# Patient Record
Sex: Male | Born: 1940 | Race: Black or African American | Hispanic: No | Marital: Married | Smoking: Never smoker
Health system: Southern US, Community
[De-identification: ages and names within clinical notes are randomized; demographics above are authoritative.]

## PROBLEM LIST (undated history)

## (undated) DIAGNOSIS — R040 Epistaxis: Secondary | ICD-10-CM

## (undated) DIAGNOSIS — R7303 Prediabetes: Secondary | ICD-10-CM

## (undated) DIAGNOSIS — I10 Essential (primary) hypertension: Secondary | ICD-10-CM

## (undated) HISTORY — PX: KNEE ARTHROSCOPY: SUR90

## (undated) HISTORY — PX: COLONOSCOPY: SHX174

## (undated) HISTORY — PX: POLYPECTOMY: SHX149

## (undated) HISTORY — DX: Essential (primary) hypertension: I10

---

## 2000-05-17 ENCOUNTER — Emergency Department (HOSPITAL_COMMUNITY): Admission: EM | Admit: 2000-05-17 | Discharge: 2000-05-17 | Payer: Self-pay | Admitting: Emergency Medicine

## 2005-04-30 ENCOUNTER — Ambulatory Visit: Payer: Self-pay | Admitting: Gastroenterology

## 2005-05-11 ENCOUNTER — Ambulatory Visit: Payer: Self-pay | Admitting: Gastroenterology

## 2010-07-02 ENCOUNTER — Encounter: Payer: Self-pay | Admitting: Gastroenterology

## 2010-07-02 ENCOUNTER — Ambulatory Visit (AMBULATORY_SURGERY_CENTER): Payer: Medicare HMO | Admitting: *Deleted

## 2010-07-02 VITALS — Ht 69.0 in | Wt 246.0 lb

## 2010-07-02 DIAGNOSIS — Z86010 Personal history of colonic polyps: Secondary | ICD-10-CM

## 2010-07-02 MED ORDER — PEG-KCL-NACL-NASULF-NA ASC-C 100 G PO SOLR: ORAL | Status: DC

## 2010-07-16 ENCOUNTER — Encounter: Payer: Self-pay | Admitting: Gastroenterology

## 2010-07-16 ENCOUNTER — Ambulatory Visit (AMBULATORY_SURGERY_CENTER): Payer: Medicare HMO | Admitting: Gastroenterology

## 2010-07-16 DIAGNOSIS — K5730 Diverticulosis of large intestine without perforation or abscess without bleeding: Secondary | ICD-10-CM

## 2010-07-16 DIAGNOSIS — D126 Benign neoplasm of colon, unspecified: Secondary | ICD-10-CM

## 2010-07-16 DIAGNOSIS — Z86010 Personal history of colonic polyps: Secondary | ICD-10-CM

## 2010-07-16 DIAGNOSIS — Z1211 Encounter for screening for malignant neoplasm of colon: Secondary | ICD-10-CM

## 2010-07-16 MED ORDER — SODIUM CHLORIDE 0.9 % IV SOLN: 500.0000 mL | INTRAVENOUS | Status: DC

## 2010-07-16 NOTE — Patient Instructions (Signed)
Polyps, Colon  A polyp is extra tissue that grows inside your body. Colon polyps grow in the large intestine. The large intestine, also called the colon, is part of your digestive system. It is a long, hollow tube at the end of your digestive tract where your body makes and stores stool. Most polyps are not dangerous. They are benign. This means they are not cancerous. But over time, some types of polyps can turn into cancer. Polyps that are smaller than a pea are usually not harmful. But larger polyps could someday become or may already be cancerous. To be safe, doctors remove all polyps and test them.  WHO GETS POLYPS? Anyone can get polyps, but certain people are more likely than others. You may have a greater chance of getting polyps if:  You are over 50.   You have had polyps before.   Someone in your family has had polyps.   Someone in your family has had cancer of the large intestine.   Find out if someone in your family has had polyps. You may also be more likely to get polyps if you:   Eat a lot of fatty foods   Smoke   Drink alcohol   Do not exercise  Eat too much  SYMPTOMS Most small polyps do not cause symptoms. People often do not know they have one until their caregiver finds it during a regular checkup or while testing them for something else. Some people do have symptoms like these:  Bleeding from the anus. You might notice blood on your underwear or on toilet paper after you have had a bowel movement.   Constipation or diarrhea that lasts more than a week.   Blood in the stool. Blood can make stool look black or it can show up as red streaks in the stool.  If you have any of these symptoms, see your caregiver. HOW DOES THE DOCTOR TEST FOR POLYPS? The doctor can use four tests to check for polyps:  Digital rectal exam. The caregiver wears gloves and checks your rectum (the last part of the large intestine) to see if it feels normal. This test would find polyps only  in the rectum. Your caregiver may need to do one of the other tests listed below to find polyps higher up in the intestine.   Barium enema. The caregiver puts a liquid called barium into your rectum before taking x-rays of your large intestine. Barium makes your intestine look white in the pictures. Polyps are dark, so they are easy to see.   Sigmoidoscopy. With this test, the caregiver can see inside your large intestine. A thin flexible tube is placed into your rectum. The device is called a sigmoidoscope, which has a light and a tiny video camera in it. The caregiver uses the sigmoidoscope to look at the last third of your large intestine.   Colonoscopy. This test is like sigmoidoscopy, but the caregiver looks at all of the large intestine. It usually requires sedation. This is the most common method for finding and removing polyps.  TREATMENT  The caregiver will remove the polyp during sigmoidoscopy or colonoscopy. The polyp is then tested for cancer.   If you have had polyps, your caregiver may want you to get tested regularly in the future.  PREVENTION There is not one sure way to prevent polyps. You might be able to lower your risk of getting them if you:  Eat more fruits and vegetables and less fatty food.     Do not smoke.   Avoid alcohol.   Exercise every day.   Lose weight if you are overweight.   Eating more calcium and folate can also lower your risk of getting polyps. Some foods that are rich in calcium are milk, cheese, and broccoli. Some foods that are rich in folate are chickpeas, kidney beans, and spinach.   Aspirin might help prevent polyps. Studies are under way.  Document Released: 11/12/2003 Document Re-Released: 08/05/2009 [employer] Patient Information 2011 [employer]  .Diverticulosis Diverticulosis is a common condition that develops when small pouches (diverticula) form in the wall of the colon. The risk of diverticulosis increases with age. It happens more  often in people who eat a low-fiber diet. Most individuals with diverticulosis have no symptoms. Those individuals with symptoms usually experience belly (abdominal) pain, constipation, or loose stools (diarrhea). HOME CARE INSTRUCTIONS  Increase the amount of fiber in your diet as directed by your caregiver or dietician. This may reduce symptoms of diverticulosis.   Your caregiver may recommend taking a dietary fiber supplement.   Drink at least 6 to 8 glasses of water each day to prevent constipation.   Try not to strain when you have a bowel movement.   Your caregiver may recommend avoiding nuts and seeds to prevent complications, although this is still an uncertain benefit.   Only take over-the-counter or prescription medicines for pain, discomfort, or fever as directed by your caregiver.  FOODS HAVING HIGH FIBER CONTENT INCLUDE:  Fruits. Apple, peach, pear, tangerine, raisins, prunes.   Vegetables. Brussels sprouts, asparagus, broccoli, cabbage, carrot, cauliflower, romaine lettuce, spinach, summer squash, tomato, winter squash, zucchini.   Starchy Vegetables. Baked beans, kidney beans, lima beans, split peas, lentils, potatoes (with skin).   Grains. Whole wheat bread, brown rice, bran flake cereal, plain oatmeal, white rice, shredded wheat, bran muffins.  SEEK IMMEDIATE MEDICAL CARE IF:  You develop increasing pain or severe bloating.   You have an oral temperature above 100, not controlled by medicine.   You develop vomiting or bowel movements that are bloody or black.  Document Released: 11/13/2003 Document Re-Released: 08/05/2009 [employer] Patient Information 2011 [employer]. 

## 2010-07-17 ENCOUNTER — Telehealth: Payer: Self-pay

## 2010-07-17 NOTE — Telephone Encounter (Signed)

## 2013-12-11 ENCOUNTER — Encounter: Payer: Self-pay | Admitting: Gastroenterology

## 2014-01-21 ENCOUNTER — Ambulatory Visit: Payer: Medicare HMO | Admitting: Podiatry

## 2014-07-16 ENCOUNTER — Emergency Department (HOSPITAL_COMMUNITY)
Admission: EM | Admit: 2014-07-16 | Discharge: 2014-07-16 | Disposition: A | Discharge status: Home / Self Care | Payer: Commercial Managed Care - HMO | Attending: Emergency Medicine | Admitting: Emergency Medicine

## 2014-07-16 ENCOUNTER — Encounter (HOSPITAL_COMMUNITY): Payer: Self-pay | Admitting: *Deleted

## 2014-07-16 DIAGNOSIS — S3992XA Unspecified injury of lower back, initial encounter: Secondary | ICD-10-CM | POA: Diagnosis not present

## 2014-07-16 DIAGNOSIS — I10 Essential (primary) hypertension: Secondary | ICD-10-CM | POA: Insufficient documentation

## 2014-07-16 DIAGNOSIS — Z79899 Other long term (current) drug therapy: Secondary | ICD-10-CM | POA: Insufficient documentation

## 2014-07-16 DIAGNOSIS — Y939 Activity, unspecified: Secondary | ICD-10-CM | POA: Diagnosis not present

## 2014-07-16 DIAGNOSIS — Y92410 Unspecified street and highway as the place of occurrence of the external cause: Secondary | ICD-10-CM | POA: Insufficient documentation

## 2014-07-16 DIAGNOSIS — Y999 Unspecified external cause status: Secondary | ICD-10-CM | POA: Diagnosis not present

## 2014-07-16 DIAGNOSIS — M545 Low back pain: Secondary | ICD-10-CM | POA: Diagnosis not present

## 2014-07-16 MED ORDER — METHOCARBAMOL 500 MG PO TABS: 500.0000 mg | ORAL_TABLET | Freq: Two times a day (BID) | ORAL | Status: DC
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
## 2014-07-22 DIAGNOSIS — M545 Low back pain: Secondary | ICD-10-CM | POA: Diagnosis not present

## 2014-07-26 DIAGNOSIS — M545 Low back pain: Secondary | ICD-10-CM | POA: Diagnosis not present

## 2014-07-26 DIAGNOSIS — M542 Cervicalgia: Secondary | ICD-10-CM | POA: Diagnosis not present

## 2014-08-05 DIAGNOSIS — M542 Cervicalgia: Secondary | ICD-10-CM | POA: Diagnosis not present

## 2014-08-05 DIAGNOSIS — M545 Low back pain: Secondary | ICD-10-CM | POA: Diagnosis not present

## 2014-08-13 DIAGNOSIS — M545 Low back pain: Secondary | ICD-10-CM | POA: Diagnosis not present

## 2014-08-13 DIAGNOSIS — M542 Cervicalgia: Secondary | ICD-10-CM | POA: Diagnosis not present

## 2014-08-20 DIAGNOSIS — M542 Cervicalgia: Secondary | ICD-10-CM | POA: Diagnosis not present

## 2014-08-20 DIAGNOSIS — M545 Low back pain: Secondary | ICD-10-CM | POA: Diagnosis not present

## 2014-08-22 DIAGNOSIS — M545 Low back pain: Secondary | ICD-10-CM | POA: Diagnosis not present

## 2014-08-22 DIAGNOSIS — M542 Cervicalgia: Secondary | ICD-10-CM | POA: Diagnosis not present

## 2014-08-26 DIAGNOSIS — M542 Cervicalgia: Secondary | ICD-10-CM | POA: Diagnosis not present

## 2014-08-27 DIAGNOSIS — M545 Low back pain: Secondary | ICD-10-CM | POA: Diagnosis not present

## 2014-08-27 DIAGNOSIS — M542 Cervicalgia: Secondary | ICD-10-CM | POA: Diagnosis not present

## 2014-08-29 DIAGNOSIS — M542 Cervicalgia: Secondary | ICD-10-CM | POA: Diagnosis not present

## 2014-08-29 DIAGNOSIS — M545 Low back pain: Secondary | ICD-10-CM | POA: Diagnosis not present

## 2014-09-10 DIAGNOSIS — M542 Cervicalgia: Secondary | ICD-10-CM | POA: Diagnosis not present

## 2014-09-10 DIAGNOSIS — M545 Low back pain: Secondary | ICD-10-CM | POA: Diagnosis not present

## 2014-09-12 DIAGNOSIS — M545 Low back pain: Secondary | ICD-10-CM | POA: Diagnosis not present

## 2014-09-12 DIAGNOSIS — M542 Cervicalgia: Secondary | ICD-10-CM | POA: Diagnosis not present

## 2014-09-16 DIAGNOSIS — M542 Cervicalgia: Secondary | ICD-10-CM | POA: Diagnosis not present

## 2014-09-16 DIAGNOSIS — M545 Low back pain: Secondary | ICD-10-CM | POA: Diagnosis not present

## 2014-12-23 DIAGNOSIS — R7301 Impaired fasting glucose: Secondary | ICD-10-CM | POA: Diagnosis not present

## 2014-12-23 DIAGNOSIS — Z125 Encounter for screening for malignant neoplasm of prostate: Secondary | ICD-10-CM | POA: Diagnosis not present

## 2014-12-23 DIAGNOSIS — E785 Hyperlipidemia, unspecified: Secondary | ICD-10-CM | POA: Diagnosis not present

## 2014-12-23 DIAGNOSIS — I10 Essential (primary) hypertension: Secondary | ICD-10-CM | POA: Diagnosis not present

## 2014-12-30 DIAGNOSIS — D649 Anemia, unspecified: Secondary | ICD-10-CM | POA: Diagnosis not present

## 2014-12-30 DIAGNOSIS — E669 Obesity, unspecified: Secondary | ICD-10-CM | POA: Diagnosis not present

## 2014-12-30 DIAGNOSIS — Z1389 Encounter for screening for other disorder: Secondary | ICD-10-CM | POA: Diagnosis not present

## 2014-12-30 DIAGNOSIS — I10 Essential (primary) hypertension: Secondary | ICD-10-CM | POA: Diagnosis not present

## 2014-12-30 DIAGNOSIS — Z0000 Encounter for general adult medical examination without abnormal findings: Secondary | ICD-10-CM | POA: Diagnosis not present

## 2014-12-30 DIAGNOSIS — H409 Unspecified glaucoma: Secondary | ICD-10-CM | POA: Diagnosis not present

## 2014-12-30 DIAGNOSIS — R7301 Impaired fasting glucose: Secondary | ICD-10-CM | POA: Diagnosis not present

## 2014-12-30 DIAGNOSIS — E785 Hyperlipidemia, unspecified: Secondary | ICD-10-CM | POA: Diagnosis not present

## 2014-12-31 DIAGNOSIS — Z1212 Encounter for screening for malignant neoplasm of rectum: Secondary | ICD-10-CM | POA: Diagnosis not present

## 2015-01-29 DIAGNOSIS — Z6835 Body mass index (BMI) 35.0-35.9, adult: Secondary | ICD-10-CM | POA: Diagnosis not present

## 2015-01-29 DIAGNOSIS — I10 Essential (primary) hypertension: Secondary | ICD-10-CM | POA: Diagnosis not present

## 2015-02-07 DIAGNOSIS — I10 Essential (primary) hypertension: Secondary | ICD-10-CM | POA: Diagnosis not present

## 2015-03-24 DIAGNOSIS — H353110 Nonexudative age-related macular degeneration, right eye, stage unspecified: Secondary | ICD-10-CM | POA: Diagnosis not present

## 2015-03-24 DIAGNOSIS — H25013 Cortical age-related cataract, bilateral: Secondary | ICD-10-CM | POA: Diagnosis not present

## 2015-03-24 DIAGNOSIS — H40033 Anatomical narrow angle, bilateral: Secondary | ICD-10-CM | POA: Diagnosis not present

## 2015-03-24 DIAGNOSIS — H2513 Age-related nuclear cataract, bilateral: Secondary | ICD-10-CM | POA: Diagnosis not present

## 2015-04-24 DIAGNOSIS — R262 Difficulty in walking, not elsewhere classified: Secondary | ICD-10-CM | POA: Diagnosis not present

## 2015-04-24 DIAGNOSIS — M170 Bilateral primary osteoarthritis of knee: Secondary | ICD-10-CM | POA: Diagnosis not present

## 2015-04-24 DIAGNOSIS — M25561 Pain in right knee: Secondary | ICD-10-CM | POA: Diagnosis not present

## 2015-04-24 DIAGNOSIS — M25562 Pain in left knee: Secondary | ICD-10-CM | POA: Diagnosis not present

## 2015-05-01 DIAGNOSIS — M25561 Pain in right knee: Secondary | ICD-10-CM | POA: Diagnosis not present

## 2015-05-01 DIAGNOSIS — M1711 Unilateral primary osteoarthritis, right knee: Secondary | ICD-10-CM | POA: Diagnosis not present

## 2015-05-08 DIAGNOSIS — M1712 Unilateral primary osteoarthritis, left knee: Secondary | ICD-10-CM | POA: Diagnosis not present

## 2015-05-08 DIAGNOSIS — M25562 Pain in left knee: Secondary | ICD-10-CM | POA: Diagnosis not present

## 2015-05-12 DIAGNOSIS — J069 Acute upper respiratory infection, unspecified: Secondary | ICD-10-CM | POA: Diagnosis not present

## 2015-05-22 DIAGNOSIS — M25562 Pain in left knee: Secondary | ICD-10-CM | POA: Diagnosis not present

## 2015-05-22 DIAGNOSIS — M25561 Pain in right knee: Secondary | ICD-10-CM | POA: Diagnosis not present

## 2015-05-22 DIAGNOSIS — M170 Bilateral primary osteoarthritis of knee: Secondary | ICD-10-CM | POA: Diagnosis not present

## 2015-05-27 ENCOUNTER — Encounter: Payer: Self-pay | Admitting: Gastroenterology

## 2015-07-09 ENCOUNTER — Ambulatory Visit (AMBULATORY_SURGERY_CENTER): Payer: Self-pay | Admitting: *Deleted

## 2015-07-09 VITALS — Ht 69.0 in | Wt 244.0 lb

## 2015-07-09 DIAGNOSIS — Z86010 Personal history of colonic polyps: Secondary | ICD-10-CM

## 2015-07-09 NOTE — Progress Notes (Signed)
Patient denies any allergies to eggs or soy. Patient denies any problems with anesthesia/sedation. Patient denies any oxygen use at home and does not take any diet/weight loss medications.  

## 2015-07-10 ENCOUNTER — Encounter: Payer: Self-pay | Admitting: Gastroenterology

## 2015-07-23 ENCOUNTER — Ambulatory Visit (AMBULATORY_SURGERY_CENTER): Payer: Commercial Managed Care - HMO | Admitting: Gastroenterology

## 2015-07-23 ENCOUNTER — Encounter: Payer: Self-pay | Admitting: Gastroenterology

## 2015-07-23 VITALS — BP 104/74 | HR 45 | Temp 96.8°F | Resp 15 | Ht 69.0 in | Wt 244.0 lb

## 2015-07-23 DIAGNOSIS — Z86010 Personal history of colonic polyps: Secondary | ICD-10-CM | POA: Diagnosis present

## 2015-07-23 DIAGNOSIS — I10 Essential (primary) hypertension: Secondary | ICD-10-CM | POA: Diagnosis not present

## 2015-07-23 DIAGNOSIS — E119 Type 2 diabetes mellitus without complications: Secondary | ICD-10-CM | POA: Diagnosis not present

## 2015-07-23 DIAGNOSIS — K635 Polyp of colon: Secondary | ICD-10-CM

## 2015-07-23 DIAGNOSIS — D123 Benign neoplasm of transverse colon: Secondary | ICD-10-CM | POA: Diagnosis not present

## 2015-07-23 LAB — GLUCOSE, CAPILLARY
Glucose-Capillary: 112 mg/dL — ABNORMAL HIGH (ref 65–99)
Glucose-Capillary: 95 mg/dL (ref 65–99)

## 2015-07-23 MED ORDER — SODIUM CHLORIDE 0.9 % IV SOLN: 500.0000 mL | INTRAVENOUS | Status: DC

## 2015-07-23 NOTE — Patient Instructions (Signed)
Colon polyp  X 1 removed today. Handout given on polyps,and diverticulcosis. Result letter in your mail in 2-3 weeks.  Resume current medications. Call us with any questions or concerns. Thank you!!   YOU HAD AN ENDOSCOPIC PROCEDURE TODAY AT [facility]:   Refer to the procedure report that was given to you for any specific questions about what was found during the examination.  If the procedure report does not answer your questions, please call your gastroenterologist to clarify.  If you requested that your care partner not be given the details of your procedure findings, then the procedure report has been included in a sealed envelope for you to review at your convenience later.  YOU SHOULD EXPECT: Some feelings of bloating in the abdomen. Passage of more gas than usual.  Walking can help get rid of the air that was put into your GI tract during the procedure and reduce the bloating. If you had a lower endoscopy (such as a colonoscopy or flexible sigmoidoscopy) you may notice spotting of blood in your stool or on the toilet paper. If you underwent a bowel prep for your procedure, you may not have a normal bowel movement for a few days.  Please Note:  You might notice some irritation and congestion in your nose or some drainage.  This is from the oxygen used during your procedure.  There is no need for concern and it should clear up in a day or so.  SYMPTOMS TO REPORT IMMEDIATELY:   Following lower endoscopy (colonoscopy or flexible sigmoidoscopy):  Excessive amounts of blood in the stool  Significant tenderness or worsening of abdominal pains  Swelling of the abdomen that is new, acute  Fever of 100F or higher   For urgent or emergent issues, a gastroenterologist can be reached at any hour by calling [phone].   DIET: Your first meal following the procedure should be a small meal and then it is ok to progress to your normal diet. Heavy or fried foods are harder to  digest and may make you feel nauseous or bloated.  Likewise, meals heavy in dairy and vegetables can increase bloating.  Drink plenty of fluids but you should avoid alcoholic beverages for 24 hours.  ACTIVITY:  You should plan to take it easy for the rest of today and you should NOT DRIVE or use heavy machinery until tomorrow (because of the sedation medicines used during the test).    FOLLOW UP: Our staff will call the number listed on your records the next business day following your procedure to check on you and address any questions or concerns that you may have regarding the information given to you following your procedure. If we do not reach you, we will leave a message.  However, if you are feeling well and you are not experiencing any problems, there is no need to return our call.  We will assume that you have returned to your regular daily activities without incident.  If any biopsies were taken you will be contacted by phone or by letter within the next 1-3 weeks.  Please call us at [phone] if you have not heard about the biopsies in 3 weeks.    SIGNATURES/CONFIDENTIALITY: You and/or your care partner have signed paperwork which will be entered into your electronic medical record.  These signatures attest to the fact that that the information above on your After Visit Summary has been reviewed and is understood.  Full responsibility of the confidentiality  of this discharge information lies with you and/or your care-partner.

## 2015-07-23 NOTE — Progress Notes (Signed)
Called to room to assist during endoscopic procedure.  Patient ID and intended procedure confirmed with present staff. Received instructions for my participation in the procedure from the performing physician.  
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
## 2015-07-23 NOTE — Progress Notes (Signed)
Report to PACU, RN, vss, BBS= Clear.  

## 2015-07-24 ENCOUNTER — Telehealth: Payer: Self-pay

## 2015-07-24 NOTE — Telephone Encounter (Signed)
  Follow up Call-  Call back number 07/23/2015  Post procedure Call Back phone  # [phone]  Permission to leave phone message Yes    Patient was called for follow up after his procedure on 07/23/2015. Frank Hernandez's wife Frank Hernandez reports that he has returned to his normal daily activities without difficulty.

## 2015-07-29 ENCOUNTER — Encounter: Payer: Self-pay | Admitting: Gastroenterology

## 2015-09-22 ENCOUNTER — Ambulatory Visit (INDEPENDENT_AMBULATORY_CARE_PROVIDER_SITE_OTHER): Payer: Commercial Managed Care - HMO | Admitting: Podiatry

## 2015-09-22 ENCOUNTER — Ambulatory Visit (INDEPENDENT_AMBULATORY_CARE_PROVIDER_SITE_OTHER): Payer: Commercial Managed Care - HMO

## 2015-09-22 ENCOUNTER — Encounter: Payer: Self-pay | Admitting: Podiatry

## 2015-09-22 VITALS — BP 151/104 | HR 55 | Resp 20

## 2015-09-22 DIAGNOSIS — M779 Enthesopathy, unspecified: Secondary | ICD-10-CM

## 2015-09-22 DIAGNOSIS — M79671 Pain in right foot: Secondary | ICD-10-CM

## 2015-09-22 DIAGNOSIS — M2041 Other hammer toe(s) (acquired), right foot: Secondary | ICD-10-CM | POA: Diagnosis not present

## 2015-09-22 MED ORDER — TRIAMCINOLONE ACETONIDE 10 MG/ML IJ SUSP
10.0000 mg | Freq: Once | INTRAMUSCULAR | Status: AC
  Administered 2015-09-22: 10 mg

## 2015-09-22 NOTE — Progress Notes (Signed)
   Subjective:    Patient ID: Frank Hernandez, male    DOB: 09/22/1940, 75 y.o.   MRN: [id number]  HPI "On my right foot between the little toe and the toe next to it, there's a bunion in between it.  It's sore.  It's like a black head has come up in there.  I take 2 advil and it relieves the pain.  It's sore.  My work shoes bother it when I wear them."    Review of Systems  Cardiovascular:       High blood pressure  Musculoskeletal:       Cramps  All other systems reviewed and are negative.      Objective:   Physical Exam        Assessment & Plan:

## 2015-10-20 ENCOUNTER — Ambulatory Visit (INDEPENDENT_AMBULATORY_CARE_PROVIDER_SITE_OTHER): Payer: Commercial Managed Care - HMO | Admitting: Podiatry

## 2015-10-20 DIAGNOSIS — M779 Enthesopathy, unspecified: Secondary | ICD-10-CM | POA: Diagnosis not present

## 2015-10-20 DIAGNOSIS — B351 Tinea unguium: Secondary | ICD-10-CM

## 2015-10-20 MED ORDER — TRIAMCINOLONE ACETONIDE 10 MG/ML IJ SUSP
10.0000 mg | Freq: Once | INTRAMUSCULAR | Status: AC
  Administered 2015-10-20: 10 mg

## 2015-10-20 NOTE — Progress Notes (Signed)
Subjective:     Patient ID: Frank Hernandez, male   DOB: 08/03/1940, 75 y.o.   MRN: [id number]  HPI patient presents stating that he is having a lot of pain on his fourth and fifth toes right with a very E long gaited nailbed on the fifth digit that's painful and a lesion on the inside of the fourth toe that's painful   Review of Systems     Objective:   Physical Exam Neurovascular status intact muscle strength adequate with thickened right fifth nail that's dystrophic and inflammation of the inner side fourth digit at the head of the proximal phalanx    Assessment:     Inflammatory changes fourth digit with elongated thickened nail fifth right that's painful    Plan:     Anesthetic applied and I then went ahead and did a small injection of the interphalangeal joint fourth toe and debrided and took down the nail of the fifth with electronic cautery after finished to try to keep pressure off the fourth toe. Reappoint as needed

## 2015-12-26 ENCOUNTER — Observation Stay (HOSPITAL_COMMUNITY)
Admission: EM | Admit: 2015-12-26 | Discharge: 2015-12-27 | Disposition: A | Discharge status: Home / Self Care | Payer: Commercial Managed Care - HMO | Attending: Internal Medicine | Admitting: Internal Medicine

## 2015-12-26 ENCOUNTER — Observation Stay (HOSPITAL_COMMUNITY): Payer: Commercial Managed Care - HMO

## 2015-12-26 ENCOUNTER — Encounter (HOSPITAL_COMMUNITY): Payer: Self-pay | Admitting: Emergency Medicine

## 2015-12-26 DIAGNOSIS — R55 Syncope and collapse: Principal | ICD-10-CM | POA: Diagnosis present

## 2015-12-26 DIAGNOSIS — Z7984 Long term (current) use of oral hypoglycemic drugs: Secondary | ICD-10-CM | POA: Insufficient documentation

## 2015-12-26 DIAGNOSIS — E785 Hyperlipidemia, unspecified: Secondary | ICD-10-CM | POA: Insufficient documentation

## 2015-12-26 DIAGNOSIS — R040 Epistaxis: Secondary | ICD-10-CM

## 2015-12-26 DIAGNOSIS — I10 Essential (primary) hypertension: Secondary | ICD-10-CM | POA: Diagnosis not present

## 2015-12-26 DIAGNOSIS — R7303 Prediabetes: Secondary | ICD-10-CM | POA: Diagnosis not present

## 2015-12-26 DIAGNOSIS — I440 Atrioventricular block, first degree: Secondary | ICD-10-CM | POA: Insufficient documentation

## 2015-12-26 DIAGNOSIS — R001 Bradycardia, unspecified: Secondary | ICD-10-CM | POA: Insufficient documentation

## 2015-12-26 HISTORY — DX: Prediabetes: R73.03

## 2015-12-26 HISTORY — DX: Epistaxis: R04.0

## 2015-12-26 LAB — URINALYSIS, ROUTINE W REFLEX MICROSCOPIC
Bilirubin Urine: NEGATIVE
Glucose, UA: NEGATIVE mg/dL
Hgb urine dipstick: NEGATIVE
Ketones, ur: NEGATIVE mg/dL
Leukocytes, UA: NEGATIVE
Nitrite: NEGATIVE
Protein, ur: NEGATIVE mg/dL
Specific Gravity, Urine: 1.012 (ref 1.005–1.030)
pH: 5.5 (ref 5.0–8.0)

## 2015-12-26 LAB — BASIC METABOLIC PANEL WITH GFR
Anion gap: 9 (ref 5–15)
BUN: 15 mg/dL (ref 6–20)
CO2: 23 mmol/L (ref 22–32)
Calcium: 9.6 mg/dL (ref 8.9–10.3)
Chloride: 108 mmol/L (ref 101–111)
Creatinine, Ser: 1.11 mg/dL (ref 0.61–1.24)
GFR calc Af Amer: >60 mL/min
GFR calc non Af Amer: >60 mL/min
Glucose, Bld: 158 mg/dL — ABNORMAL HIGH (ref 65–99)
Potassium: 3.7 mmol/L (ref 3.5–5.1)
Sodium: 140 mmol/L (ref 135–145)

## 2015-12-26 LAB — CBC WITH DIFFERENTIAL/PLATELET
Basophils Absolute: 0 K/uL (ref 0.0–0.1)
Basophils Relative: 0 %
Eosinophils Absolute: 0.1 K/uL (ref 0.0–0.7)
Eosinophils Relative: 2 %
HCT: 34.9 % — ABNORMAL LOW (ref 39.0–52.0)
Hemoglobin: 11.8 g/dL — ABNORMAL LOW (ref 13.0–17.0)
Lymphocytes Relative: 33 %
Lymphs Abs: 2.1 K/uL (ref 0.7–4.0)
MCH: 29.5 pg (ref 26.0–34.0)
MCHC: 33.8 g/dL (ref 30.0–36.0)
MCV: 87.3 fL (ref 78.0–100.0)
Monocytes Absolute: 0.8 K/uL (ref 0.1–1.0)
Monocytes Relative: 13 %
Neutro Abs: 3.3 K/uL (ref 1.7–7.7)
Neutrophils Relative %: 52 %
Platelets: 222 K/uL (ref 150–400)
RBC: 4 MIL/uL — ABNORMAL LOW (ref 4.22–5.81)
RDW: 13.9 % (ref 11.5–15.5)
WBC: 6.4 K/uL (ref 4.0–10.5)

## 2015-12-26 LAB — I-STAT TROPONIN, ED
Troponin i, poc: 0 ng/mL (ref 0.00–0.08)
Troponin i, poc: 0.01 ng/mL (ref 0.00–0.08)

## 2015-12-26 LAB — GLUCOSE, CAPILLARY
Glucose-Capillary: 95 mg/dL (ref 65–99)
Glucose-Capillary: 118 mg/dL — ABNORMAL HIGH (ref 65–99)

## 2015-12-26 MED ORDER — SODIUM CHLORIDE 0.9 % IV BOLUS (SEPSIS)
1000.0000 mL | Freq: Once | INTRAVENOUS | Status: AC
  Administered 2015-12-26: 1000 mL via INTRAVENOUS

## 2015-12-26 MED ORDER — SIMVASTATIN 20 MG PO TABS
20.0000 mg | ORAL_TABLET | Freq: Every day | ORAL | Status: DC
  Administered 2015-12-27: 20 mg via ORAL
  Filled 2015-12-26: qty 1

## 2015-12-26 MED ORDER — SODIUM CHLORIDE 0.9 % IV SOLN
INTRAVENOUS | Status: AC
  Administered 2015-12-26: 16:00:00 via INTRAVENOUS

## 2015-12-26 MED ORDER — ACETAMINOPHEN 325 MG PO TABS
650.0000 mg | ORAL_TABLET | Freq: Four times a day (QID) | ORAL | Status: DC | PRN
  Administered 2015-12-27: 650 mg via ORAL
  Filled 2015-12-26: qty 2

## 2015-12-26 MED ORDER — IBUPROFEN 200 MG PO TABS: 400.0000 mg | ORAL_TABLET | ORAL | Status: DC

## 2015-12-26 MED ORDER — CARVEDILOL 25 MG PO TABS
25.0000 mg | ORAL_TABLET | Freq: Every day | ORAL | Status: DC
  Administered 2015-12-26 – 2015-12-27 (×2): 25 mg via ORAL
  Filled 2015-12-26: qty 2
  Filled 2015-12-26: qty 1

## 2015-12-26 MED ORDER — ONDANSETRON HCL 4 MG PO TABS: 4.0000 mg | ORAL_TABLET | Freq: Four times a day (QID) | ORAL | Status: DC | PRN

## 2015-12-26 MED ORDER — IBUPROFEN 200 MG PO CAPS: 400.0000 mg | ORAL_CAPSULE | ORAL | Status: DC

## 2015-12-26 MED ORDER — AMLODIPINE BESYLATE 5 MG PO TABS
5.0000 mg | ORAL_TABLET | Freq: Every day | ORAL | Status: DC
  Administered 2015-12-26 – 2015-12-27 (×2): 5 mg via ORAL
  Filled 2015-12-26 (×2): qty 1

## 2015-12-26 MED ORDER — METFORMIN HCL ER 500 MG PO TB24
500.0000 mg | ORAL_TABLET | Freq: Every day | ORAL | Status: DC
  Administered 2015-12-26 – 2015-12-27 (×2): 500 mg via ORAL
  Filled 2015-12-26 (×2): qty 1

## 2015-12-26 MED ORDER — INSULIN ASPART 100 UNIT/ML ~~LOC~~ SOLN: 0.0000 [IU] | Freq: Three times a day (TID) | SUBCUTANEOUS | Status: DC

## 2015-12-26 MED ORDER — INSULIN ASPART 100 UNIT/ML ~~LOC~~ SOLN: 0.0000 [IU] | Freq: Every day | SUBCUTANEOUS | Status: DC

## 2015-12-26 MED ORDER — SODIUM CHLORIDE 0.9% FLUSH
3.0000 mL | Freq: Two times a day (BID) | INTRAVENOUS | Status: DC
  Administered 2015-12-27: 3 mL via INTRAVENOUS

## 2015-12-26 MED ORDER — ACETAMINOPHEN 650 MG RE SUPP: 650.0000 mg | Freq: Four times a day (QID) | RECTAL | Status: DC | PRN

## 2015-12-26 MED ORDER — ONDANSETRON HCL 4 MG/2ML IJ SOLN: 4.0000 mg | Freq: Four times a day (QID) | INTRAMUSCULAR | Status: DC | PRN

## 2015-12-26 NOTE — ED Notes (Signed)
Patient assisted to the ground with NT. Patient family member states patient was urinating and "he went out" . Patient wife states I called his name and he did not answer. Patient then leaned forward. Family member yelled for help. Staff arrived and help assist to the floor. PA at bedside examine patient. Patient now alert and oriented. Patient diaphoretic. Patient denies any CP or SOB at this time.

## 2015-12-26 NOTE — ED Provider Notes (Signed)
[facility] DEPT Provider Note   CSN: [id number] Arrival date & time: 12/26/15  [id number]     History   Chief Complaint Chief Complaint  Patient presents with  . Epistaxis    HPI KALLEB HEMPLE is a 75 y.o. male with a past medical history of HTN who presents to the ED today complaining of a nosebleed. Patient states he woke up this morning and was otherwise feeling fine when suddenly his right knee began to bleed. Patient was unable to control the bleeding at home so he came to the ED for further evaluation. Patient is not on any blood thinners. He denies any recent illness/URI symptoms. Patient has not blown his nose recently. No trauma or injury to the area.  HPI  Past Medical History:  Diagnosis Date  . Hypertension     There are no active problems to display for this patient.   Past Surgical History:  Procedure Laterality Date  . COLONOSCOPY    . KNEE ARTHROSCOPY Bilateral   . POLYPECTOMY         Home Medications    Prior to Admission medications   Medication Sig Start Date End Date Taking? Authorizing Provider  amLODipine (NORVASC) 5 MG tablet Take 5 mg by mouth daily.      Historical Provider, MD  carvedilol (COREG) 25 MG tablet Take 25 mg by mouth 2 (two) times daily with a meal.    Historical Provider, MD  Ibuprofen (ADVIL) 200 MG CAPS Take 1 capsule by mouth as needed.    Historical Provider, MD  metFORMIN (GLUCOPHAGE) 500 MG tablet Take 500 mg by mouth every evening. Reported on 07/09/2015    Historical Provider, MD  simvastatin (ZOCOR) 20 MG tablet Take 20 mg by mouth daily.    Historical Provider, MD  valsartan-hydrochlorothiazide (DIOVAN-HCT) 160-12.5 MG tablet Take 1 tablet by mouth daily.    Historical Provider, MD    Family History Family History  Problem Relation Age of Onset  . Colon cancer Neg Hx     Social History Social History  Substance Use Topics  . Smoking status: Never Smoker  . Smokeless tobacco: Never Used  . Alcohol use No       Allergies   Review of patient's allergies indicates no known allergies.   Review of Systems Review of Systems  All other systems reviewed and are negative.    Physical Exam Updated Vital Signs BP 171/98   Pulse 69   Temp 97.6 F (36.4 C) (Oral)   SpO2 100%   Physical Exam  Constitutional: He is oriented to person, place, and time. He appears well-developed and well-nourished. No distress.  HENT:  Head: Normocephalic and atraumatic.  Nose: Epistaxis ( Bleeding noted in her right nare with identifiable source) is observed.  Mouth/Throat: No oropharyngeal exudate.  Eyes: Conjunctivae and EOM are normal. Pupils are equal, round, and reactive to light. Right eye exhibits no discharge. Left eye exhibits no discharge. No scleral icterus.  Cardiovascular: Normal rate, regular rhythm, normal heart sounds and intact distal pulses.  Exam reveals no gallop and no friction rub.   No murmur heard. Pulmonary/Chest: Effort normal and breath sounds normal. No respiratory distress. He has no wheezes. He has no rales. He exhibits no tenderness.  Abdominal: Soft. He exhibits no distension. There is no tenderness. There is no guarding.  Musculoskeletal: Normal range of motion. He exhibits no edema.  Neurological: He is alert and oriented to person, place, and time.  Skin: Skin is warm and  dry. No rash noted. He is not diaphoretic. No erythema. No pallor.  Psychiatric: He has a normal mood and affect. His behavior is normal.  Nursing note and vitals reviewed.    ED Treatments / Results  Labs (all labs ordered are listed, but only abnormal results are displayed) Labs Reviewed  BASIC METABOLIC PANEL - Abnormal; Notable for the following:       Result Value   Glucose, Bld 158 (*)    All other components within normal limits  CBC WITH DIFFERENTIAL/PLATELET - Abnormal; Notable for the following:    RBC 4.00 (*)    Hemoglobin 11.8 (*)    HCT 34.9 (*)    All other components within normal  limits  URINALYSIS, ROUTINE W REFLEX MICROSCOPIC (NOT AT [facility])  HEMOGLOBIN A1C  I-STAT TROPOININ, ED  I-STAT TROPOININ, ED    EKG  EKG Interpretation  Date/Time:  Friday December 26 2015 10:50:55 EDT Ventricular Rate:  77 PR Interval:    QRS Duration: 92 QT Interval:  429 QTC Calculation: 486 R Axis:   4 Text Interpretation:  Sinus rhythm Prolonged PR interval Abnormal R-wave progression, early transition Borderline T abnormalities, anterior leads Borderline prolonged QT interval Baseline wander in lead(s) I III aVL Confirmed by Lacinda Axon  MD, BRIAN ([id number]) on 12/26/2015 12:14:02 PM       Radiology No results found.  Procedures Procedures (including critical care time)  Medications Ordered in ED Medications - No data to display   Initial Impression / Assessment and Plan / ED Course  I have reviewed the triage vital signs and the nursing notes.  Pertinent labs & imaging results that were available during my care of the patient were reviewed by me and considered in my medical decision making (see chart for details).  75 year old male with history of hypertension presents to the ED today with sudden onset epistaxis out of his right naris. Bleeding was unable to be controlled at home so came to the ED for further eval. On presentation ED, patient has active bleeding of right naris but otherwise appears well. Vitals are stable. Patient is not on blood thinners. Gauze was inserted into patient's nose which slowed bleeding. Afrin was sprayed into each naris which seemed to control the bleeding. Patient was monitored the ED for 30-45 minutes without bleeding. However, patient sit up to go to the bathroom became significant family diaphoretic and had a syncopal episode and slid to the ground while in the emergency department. No trauma or injury sustained. Patient had an chest pain is now alert and oriented. EKG obtained which was unremarkable. Labs including CBC and troponin were also  obtained. Hemoglobin stable 0.8. Troponin within normal limits. For that this was likely a vasovagal response. He was given 1 L fluids.Begin bleeding again. Suffer nitrate was applied which seemed to resolve the bleeding. Patient was again monitored for another 30-45 minutes without any additional bleeding. Orthostatics and he believes was performed. However, when this was done patient again became significantly diaphoretic and near syncopal. Suspect this is due to hypovolemia. We'll likely admit for observation and additional hydration.   Clinical Course  Comment By Time  Spoke with Luane School with [facility] who will admit pt to tele obs. Admitting attending is Dr. Aggie Moats. Dondra Spry [place], PA-C 10/27 1457   Patient was discussed with and seen by Dr. Lacinda Axon who agrees with the treatment plan.     Final Clinical Impressions(s) / ED Diagnoses   Final diagnoses:  Epistaxis  Syncope, unspecified  syncope type    New Prescriptions New Prescriptions   No medications on file     Carlos Levering, PA-C 12/26/15 [place], MD 12/28/15 [phone]

## 2015-12-26 NOTE — ED Notes (Signed)
Patient states he has not taken BP meds today. Patient denies being on blood thinners.

## 2015-12-26 NOTE — ED Notes (Signed)
Pt press call bell to go to the restroom.Nurse stated PT can ambulate. Got to the room help PT off monitor. When PT got up I asked if he was able to walk. PT respond saying "yes I am able to walk". Wife walked beside and I behide him when walking next door to restroom he tolerated it well. I asked wife if he had already had a UA colleted on him and she said "no; I don't think so. I walked be to his room to fill out the label for his UA cup. When I heard PT wife yelling from bathroom. Ran in to find PT standing but slouched over toilet he was diaphortic and unresponsive at first. The he started to fall so his wife and I gently lowered him to the floor and his head gently tap the trash can. PT back in room placed on monitor.

## 2015-12-26 NOTE — ED Notes (Signed)
Pt ambulated in the room and out in the hallway pt began to sweat shortly after starting to walk and states that he feels similar to how he felt earlier when he fell. Pt brought back to bed and placed on monitor VS reassessed PA made aware

## 2015-12-26 NOTE — ED Notes (Signed)
[employer]ed forces technical officer.

## 2015-12-26 NOTE — ED Notes (Signed)
PA at bedside,  

## 2015-12-26 NOTE — H&P (Signed)
History and Physical    Frank Hernandez [id number] DOB: Nov 30, 1940 DOA: 12/26/2015  PCP: [name] (Inactive) Patient coming from: home  Chief Complaint: syncope  HPI: Frank Hernandez is a very pleasant 75 y.o. male with medical history significant for diabetes, hypertension, presents to emergency Department chief complaint nosebleed. Was doing wellresolved about to be discharged when he had syncopal event in the emergency department.  Information is obtained from the patient and his wife who is at the bedside. He states he was in his usual state of health until this morning he suddenly developed bleeding from his right nare. He states he was unable to control the bleeding so he came to the emergency department. He denies being on any blood thinners denies NSAID use. No trauma. In the emergency department is believed is treated and resolved he was about to be discharged he ambulated to the bathroom with his wife. Wife reports while he was urinating near the end of urination he "went out and I had to catch him". He was still ground per staff report. Wife reports he is a sweat first. He denies headache dizziness chest pain palpitation shortness of breath nausea. He denies dysuria hematuria frequency or urgency. He reports steady stream of urine. He states "I could feel it coming". He was given IV fluids and ambulated into the hallway and again broke out into a cold sweat as he was walking. He reported starting to feel dizzy again.  ED Course: In the emergency department he's afebrile hemodynamically stable somewhat hypertensive he is not hypoxic.positive orthostatics  Review of Systems: As per HPI otherwise 10 point review of systems negative.   Ambulatory Status: Ambulates with a steady gait no recent falls  Past Medical History:  Diagnosis Date  . Bleeding from the nose   . Hypertension   . Pre-diabetes     Past Surgical History:  Procedure Laterality Date  .  COLONOSCOPY    . KNEE ARTHROSCOPY Bilateral   . POLYPECTOMY      Social History   Social History  . Marital status: Single    Spouse name: N/A  . Number of children: N/A  . Years of education: N/A   Occupational History  . Not on file.   Social History Main Topics  . Smoking status: Never Smoker  . Smokeless tobacco: Never Used  . Alcohol use No  . Drug use: No  . Sexual activity: Not on file   Other Topics Concern  . Not on file   Social History Narrative  . No narrative on file   He lives at home with his wife. 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 Colon cancer Neg Hx     Prior to Admission medications   Medication Sig Start Date End Date Taking? Authorizing Provider  amLODipine (NORVASC) 5 MG tablet Take 5 mg by mouth daily.     Yes Historical Provider, MD  carvedilol (COREG) 25 MG tablet Take 25 mg by mouth daily. If he remembers he takes the second dose   Yes Historical Provider, MD  Ibuprofen (ADVIL) 200 MG CAPS Take 400 mg by mouth 3 (three) times a week.    Yes Historical Provider, MD  metFORMIN (GLUCOPHAGE-XR) 500 MG 24 hr tablet Take 500 mg by mouth daily. 10/06/15  Yes Historical Provider, MD  simvastatin (ZOCOR) 20 MG tablet Take 20 mg by  mouth daily at 6 PM.    Yes Historical Provider, MD  valsartan-hydrochlorothiazide (DIOVAN-HCT) 160-12.5 MG tablet Take 1 tablet by mouth daily.   Yes Historical Provider, MD    Physical Exam: Vitals:   12/26/15 1415 12/26/15 1430 12/26/15 1445 12/26/15 1500  BP: (!) 151/102 150/84 167/90 161/86  Pulse: 78 77 75 66  Resp:  19 18 15   Temp:      TempSrc:      SpO2: 99% 100% 100% 100%     General:  Appears calm and comfortable Eyes:  PERRL, EOMI, normal lids, iris ENT:  grossly normal hearing, lips & tongue, Mucous membranes of his mouth are pink slightly dry Neck:  no LAD, masses or thyromegaly Cardiovascular:   Regular rate and rhythm, no m/r/g. No LE edema.  Respiratory:  CTA bilaterally, no w/r/r. Normal respiratory effort. Abdomen:  soft, ntnd, obese positive bowel sounds no guarding or rebounding Skin:  no rash or induration seen on limited exam Musculoskeletal:  grossly normal tone BUE/BLE, good ROM, no bony abnormality Psychiatric:  grossly normal mood and affect, speech fluent and appropriate, AOx3 Neurologic:  CN 2-12 grossly intact, moves all extremities in coordinated fashion, sensation intact speech clear facial symmetry  Labs on Admission: I have personally reviewed following labs and imaging studies  CBC:  Recent Labs Lab 12/26/15 1049  WBC 6.4  NEUTROABS 3.3  HGB 11.8*  HCT 34.9*  MCV 87.3  PLT [id number]   Basic Metabolic Panel:  Recent Labs Lab 12/26/15 1049  NA 140  K 3.7  CL 108  CO2 23  GLUCOSE 158*  BUN 15  CREATININE 1.11  CALCIUM 9.6   GFR: CrCl cannot be calculated (Unknown ideal weight.). Liver Function Tests: No results for input(s): AST, ALT, ALKPHOS, BILITOT, PROT, ALBUMIN in the last 168 hours. No results for input(s): LIPASE, AMYLASE in the last 168 hours. No results for input(s): AMMONIA in the last 168 hours. Coagulation Profile: No results for input(s): INR, PROTIME in the last 168 hours. Cardiac Enzymes: No results for input(s): CKTOTAL, CKMB, CKMBINDEX, TROPONINI in the last 168 hours. BNP (last 3 results) No results for input(s): PROBNP in the last 8760 hours. HbA1C: No results for input(s): HGBA1C in the last 72 hours. CBG: No results for input(s): GLUCAP in the last 168 hours. Lipid Profile: No results for input(s): CHOL, HDL, LDLCALC, TRIG, CHOLHDL, LDLDIRECT in the last 72 hours. Thyroid Function Tests: No results for input(s): TSH, T4TOTAL, FREET4, T3FREE, THYROIDAB in the last 72 hours. Anemia Panel: No results for input(s): VITAMINB12, FOLATE, FERRITIN, TIBC, IRON, RETICCTPCT in the last 72 hours. Urine analysis:    Component  Value Date/Time   COLORURINE YELLOW 12/26/2015 [place] 12/26/2015 1055   LABSPEC 1.012 12/26/2015 1055   PHURINE 5.5 12/26/2015 1055   GLUCOSEU NEGATIVE 12/26/2015 1055   HGBUR NEGATIVE 12/26/2015 1055   BILIRUBINUR NEGATIVE 12/26/2015 1055   KETONESUR NEGATIVE 12/26/2015 1055   PROTEINUR NEGATIVE 12/26/2015 1055   NITRITE NEGATIVE 12/26/2015 1055   LEUKOCYTESUR NEGATIVE 12/26/2015 1055    Creatinine Clearance: CrCl cannot be calculated (Unknown ideal weight.).  Sepsis Labs: @LABRCNTIP (procalcitonin:4,lacticidven:4) )No results found for this or any previous visit (from the past 240 hour(s)).   Radiological Exams on Admission: No results found.  EKG: Independently reviewed. Sinus rhythm Prolonged PR interval Abnormal R-wave progression, early transition Borderline T abnormalities, anterior leads Borderline prolonged QT interval Baseline wander in lead(s) I III aVL  Assessment/Plan Principal Problem:   Syncope Active Problems:  Hypertension   Pre-diabetes   #1. Syncope/near syncope. Etiology uncertain may be related to vagal response in setting of possible hypovolemia secondary to nosebleed. Syncopal episode occurred while urinating second near-syncope while ambulating. No metabolic derangement EKG without acute changes, initial troponin negative, urinalysis unremarkable, home medications benign. Does have positive orthostatics -Admit to telemetry -IV fluids -Chest x-ray -We'll get a 2-D echo for completeness -Repeat orthostatics in a.m. -Blood pressure control  #2. Hypertension.  Initially patient hypertensive. Orthostatic positive. Home medications include amlodipine, Coreg, Diovan HCTZ -We will continue amlodipine and Coreg with parameters -Hold valsartan and hydrochlorothiazide -Monitor closely  #3. Prediabetes. Patient says PCP diagnosed him with prediabetes. Home medications include metformin. Serum glucose 158. -Continue metformin -Carb  modified diet -SSI for optimal control -obtain A1c     DVT prophylaxis: scd  Code Status: full  Family Communication: wife at bedside  Disposition Plan: home when ready hopefully tomorrow  Consults called: none  Admission status: obs    Radene Gunning MD [facility]  If 7PM-7AM, please contact night-coverage [email or web address] Password [email or web address]  12/26/2015, 3:35 PM

## 2015-12-26 NOTE — ED Notes (Signed)
Placed pt onto bedpan, tolerated well.

## 2015-12-26 NOTE — ED Notes (Signed)
Attempted report. RN to call back 

## 2015-12-26 NOTE — ED Triage Notes (Signed)
Patient present today states his nose started bleeding for 1 hour . Patient denies any Pain or SOB. Patient denies any injury. Patient states sudden onset.

## 2015-12-27 DIAGNOSIS — R001 Bradycardia, unspecified: Secondary | ICD-10-CM | POA: Diagnosis not present

## 2015-12-27 DIAGNOSIS — Z7984 Long term (current) use of oral hypoglycemic drugs: Secondary | ICD-10-CM | POA: Diagnosis not present

## 2015-12-27 DIAGNOSIS — R55 Syncope and collapse: Secondary | ICD-10-CM

## 2015-12-27 DIAGNOSIS — R040 Epistaxis: Secondary | ICD-10-CM | POA: Diagnosis not present

## 2015-12-27 DIAGNOSIS — E785 Hyperlipidemia, unspecified: Secondary | ICD-10-CM | POA: Diagnosis not present

## 2015-12-27 DIAGNOSIS — I10 Essential (primary) hypertension: Secondary | ICD-10-CM | POA: Diagnosis not present

## 2015-12-27 DIAGNOSIS — R7303 Prediabetes: Secondary | ICD-10-CM | POA: Diagnosis not present

## 2015-12-27 DIAGNOSIS — I440 Atrioventricular block, first degree: Secondary | ICD-10-CM | POA: Diagnosis not present

## 2015-12-27 LAB — GLUCOSE, CAPILLARY
Glucose-Capillary: 93 mg/dL (ref 65–99)
Glucose-Capillary: 100 mg/dL — ABNORMAL HIGH (ref 65–99)
Glucose-Capillary: 100 mg/dL — ABNORMAL HIGH (ref 65–99)
Glucose-Capillary: 97 mg/dL (ref 65–99)

## 2015-12-27 LAB — CBC
HCT: 27.6 % — ABNORMAL LOW (ref 39.0–52.0)
Hemoglobin: 9.3 g/dL — ABNORMAL LOW (ref 13.0–17.0)
MCH: 29.3 pg (ref 26.0–34.0)
MCHC: 33.7 g/dL (ref 30.0–36.0)
MCV: 87.1 fL (ref 78.0–100.0)
Platelets: 181 10*3/uL (ref 150–400)
RBC: 3.17 MIL/uL — ABNORMAL LOW (ref 4.22–5.81)
RDW: 14 % (ref 11.5–15.5)
WBC: 5.3 10*3/uL (ref 4.0–10.5)

## 2015-12-27 LAB — BASIC METABOLIC PANEL
Anion gap: 6 (ref 5–15)
BUN: 26 mg/dL — ABNORMAL HIGH (ref 6–20)
CO2: 23 mmol/L (ref 22–32)
Calcium: 9.1 mg/dL (ref 8.9–10.3)
Chloride: 110 mmol/L (ref 101–111)
Creatinine, Ser: 0.95 mg/dL (ref 0.61–1.24)
GFR calc Af Amer: >60 mL/min (ref >60)
GFR calc non Af Amer: >60 mL/min (ref >60)
Glucose, Bld: 111 mg/dL — ABNORMAL HIGH (ref 65–99)
Potassium: 4 mmol/L (ref 3.5–5.1)
Sodium: 139 mmol/L (ref 135–145)

## 2015-12-27 LAB — HEMOGLOBIN A1C
Hgb A1c MFr Bld: 6.1 % — ABNORMAL HIGH (ref 4.8–5.6)
Mean Plasma Glucose: 128 mg/dL

## 2015-12-27 NOTE — Progress Notes (Signed)
Pt ambulated in a hallway almost 23ft without distress and SOB, orthostatic vitals recorded and pt felt no dizziness.

## 2015-12-27 NOTE — Discharge Summary (Signed)
Pt got discharged to home, discharge instructions provided and patient showed understanding to it, IV taken out,Telemonitor DC,pt left unit in wheelchair with all of the belongings accompanied with a family member (wife) 

## 2015-12-27 NOTE — Progress Notes (Signed)
Pt's 12L EKG this am was converted to Afib with slow ventricular response from NSR yesterday. Dr. Allyson Sabal was notified

## 2015-12-27 NOTE — Discharge Summary (Addendum)
Physician Discharge Summary  Frank Hernandez [id number] DOB: 01-02-1941 DOA: 12/26/2015  PCP: [name] (Inactive)  Admit date: 12/26/2015 Discharge date: 12/27/2015  Admitted From: Home  Disposition:   Home  Recommendations for Outpatient Follow-up:  1. Follow up with PCP in 1-week 2.   Patient will need outpatient sleep study  Home Health: No Equipment/Devices: no    Discharge Condition: Stable  CODE STATUS: full  Diet recommendation: Heart Healthy   Brief/Interim Summary: This is a 75 year old male who presented to hospital with chief complaint of near syncope, he was initially seen in the emergency department for epistaxis, he had a gauze inserted into his nostril and Afrin was sprayed with improvement of the bleeding. He went to the bathroom and while he was urinating he had dizziness, lightheadedness and a near syncope episode. His wife at bedside witnessed the episode and denies any loss of consciousness. Patient had persistent dizziness in the emergency department and he was admitted for further observation. On initial physical examination, his blood pressure was 150/84, heart rate 77, respiratory rate 19, oxygen saturation 100%, he was awake and alert, his oral mucosa was moist, his lungs were clear to auscultation bilaterally, heart S1-S2 present rhythmic, his abdomen soft and nontender, lower extremities no edema. His EKG had a first degree av block, chest film with was negative for infiltrates.   The patient was admitted to hospital with the working diagnosis of near syncope episode.  1. Near syncope/ bradycardia. Patient remained asymptomatic, the clinical presentation is suggestive of a vasovagal response. His telemetry showed first-degree AV block. Telemetry showed one episode of 2 nd degree type 2 at 4am while sleeping. Patient has been ambulating, with no further arrhythmias, will recommend referral for outpatient sleep study. Heart rate in the 50's to 60's.  Will hold carvedilol for now. Cased discussed with cardiology, informal consult.   2. Hypertension. Blood pressure remained stable, he was continued on amlodipine, valsartan/ hydrchlorthiazide was held, while in the hospital, coreg will be discontinued due to bradyarrhythmia.   3. Prediabetes. Patient will resume metformin at a time of discharge.  4. Dyslipidemia. Continue simvastatin.  Discharge Diagnoses:  Principal Problem:   Syncope Active Problems:   Hypertension   Pre-diabetes    Discharge Instructions     Medication List    STOP taking these medications   carvedilol 25 MG tablet Commonly known as:  COREG     TAKE these medications   ADVIL 200 MG Caps Generic drug:  Ibuprofen Take 400 mg by mouth 3 (three) times a week.   amLODipine 5 MG tablet Commonly known as:  NORVASC Take 5 mg by mouth daily.   metFORMIN 500 MG 24 hr tablet Commonly known as:  GLUCOPHAGE-XR Take 500 mg by mouth daily.   simvastatin 20 MG tablet Commonly known as:  ZOCOR Take 20 mg by mouth daily at 6 PM.   valsartan-hydrochlorothiazide 160-12.5 MG tablet Commonly known as:  DIOVAN-HCT Take 1 tablet by mouth daily.       No Known Allergies  Consultations:     Procedures/Studies: Portable Chest 1 View  Result Date: 12/26/2015 CLINICAL DATA:  Epistaxis EXAM: PORTABLE CHEST 1 VIEW COMPARISON:  None. FINDINGS: Normal heart size. Lungs clear. No pneumothorax. No pleural effusion. IMPRESSION: No active disease. Electronically Signed   By: Marybelle Killings M.D.   On: 12/26/2015 15:39        Subjective: Patient feeling better, no chest pain, no dyspnea, no dizziness. No further epistaxis. No nausea or  vomiting.   Discharge Exam: Vitals:   12/27/15 0900 12/27/15 1250  BP: 112/62 138/69  Pulse:  60  Resp:  20  Temp:  98.4 F (36.9 C)   Vitals:   12/27/15 0032 12/27/15 0515 12/27/15 0900 12/27/15 1250  BP: 117/62 140/71 112/62 138/69  Pulse: (!) 58 61  60  Resp: 18 18   20   Temp: 98.8 F (37.1 C) 98.7 F (37.1 C)  98.4 F (36.9 C)  TempSrc: Oral Oral  Oral  SpO2: 100% 99%  99%  Weight:  105.6 kg (232 lb 11.2 oz)    Height:        General: Pt is alert, awake, not in acute distress Cardiovascular: RRR, S1/S2 +, no rubs, no gallops Respiratory: CTA bilaterally, no wheezing, no rhonchi Abdominal: Soft, NT, ND, bowel sounds + Extremities: no edema, no cyanosis    The results of significant diagnostics from this hospitalization (including imaging, microbiology, ancillary and laboratory) are listed below for reference.     Microbiology: No results found for this or any previous visit (from the past 240 hour(s)).   Labs: BNP (last 3 results) No results for input(s): BNP in the last 8760 hours. Basic Metabolic Panel:  Recent Labs Lab 12/26/15 1049 12/27/15 0215  NA 140 139  K 3.7 4.0  CL 108 110  CO2 23 23  GLUCOSE 158* 111*  BUN 15 26*  CREATININE 1.11 0.95  CALCIUM 9.6 9.1   Liver Function Tests: No results for input(s): AST, ALT, ALKPHOS, BILITOT, PROT, ALBUMIN in the last 168 hours. No results for input(s): LIPASE, AMYLASE in the last 168 hours. No results for input(s): AMMONIA in the last 168 hours. CBC:  Recent Labs Lab 12/26/15 1049 12/27/15 0215  WBC 6.4 5.3  NEUTROABS 3.3  --   HGB 11.8* 9.3*  HCT 34.9* 27.6*  MCV 87.3 87.1  PLT 222 181   Cardiac Enzymes: No results for input(s): CKTOTAL, CKMB, CKMBINDEX, TROPONINI in the last 168 hours. BNP: Invalid input(s): POCBNP CBG:  Recent Labs Lab 12/26/15 1734 12/26/15 2050 12/27/15 0632 12/27/15 1137  GLUCAP 95 118* 93 100*  100*   D-Dimer No results for input(s): DDIMER in the last 72 hours. Hgb A1c  Recent Labs  12/26/15 1655  HGBA1C 6.1*   Lipid Profile No results for input(s): CHOL, HDL, LDLCALC, TRIG, CHOLHDL, LDLDIRECT in the last 72 hours. Thyroid function studies No results for input(s): TSH, T4TOTAL, T3FREE, THYROIDAB in the last 72  hours.  Invalid input(s): FREET3 Anemia work up No results for input(s): VITAMINB12, FOLATE, FERRITIN, TIBC, IRON, RETICCTPCT in the last 72 hours. Urinalysis    Component Value Date/Time   COLORURINE YELLOW 12/26/2015 [place] 12/26/2015 1055   LABSPEC 1.012 12/26/2015 1055   PHURINE 5.5 12/26/2015 1055   GLUCOSEU NEGATIVE 12/26/2015 1055   HGBUR NEGATIVE 12/26/2015 [place] 12/26/2015 1055   KETONESUR NEGATIVE 12/26/2015 1055   PROTEINUR NEGATIVE 12/26/2015 1055   NITRITE NEGATIVE 12/26/2015 1055   LEUKOCYTESUR NEGATIVE 12/26/2015 1055   Sepsis Labs Invalid input(s): PROCALCITONIN,  WBC,  LACTICIDVEN Microbiology No results found for this or any previous visit (from the past 240 hour(s)).   Time coordinating discharge: 45 minutes  SIGNED:   Tawni Millers, MD  [facility] 12/27/2015, 12:53 PM Pager   If 7PM-7AM, please contact night-coverage [email or web address] Password [email or web address]

## 2015-12-29 DIAGNOSIS — H40013 Open angle with borderline findings, low risk, bilateral: Secondary | ICD-10-CM | POA: Diagnosis not present

## 2015-12-29 DIAGNOSIS — E784 Other hyperlipidemia: Secondary | ICD-10-CM | POA: Diagnosis not present

## 2015-12-29 DIAGNOSIS — I10 Essential (primary) hypertension: Secondary | ICD-10-CM | POA: Diagnosis not present

## 2015-12-29 DIAGNOSIS — Z125 Encounter for screening for malignant neoplasm of prostate: Secondary | ICD-10-CM | POA: Diagnosis not present

## 2015-12-29 DIAGNOSIS — R7301 Impaired fasting glucose: Secondary | ICD-10-CM | POA: Diagnosis not present

## 2015-12-29 DIAGNOSIS — H40033 Anatomical narrow angle, bilateral: Secondary | ICD-10-CM | POA: Diagnosis not present

## 2016-01-02 DIAGNOSIS — M1712 Unilateral primary osteoarthritis, left knee: Secondary | ICD-10-CM | POA: Diagnosis not present

## 2016-01-02 DIAGNOSIS — M1711 Unilateral primary osteoarthritis, right knee: Secondary | ICD-10-CM | POA: Diagnosis not present

## 2016-01-05 DIAGNOSIS — Z0000 Encounter for general adult medical examination without abnormal findings: Secondary | ICD-10-CM | POA: Diagnosis not present

## 2016-01-05 DIAGNOSIS — G4709 Other insomnia: Secondary | ICD-10-CM | POA: Diagnosis not present

## 2016-01-05 DIAGNOSIS — E668 Other obesity: Secondary | ICD-10-CM | POA: Diagnosis not present

## 2016-01-05 DIAGNOSIS — E784 Other hyperlipidemia: Secondary | ICD-10-CM | POA: Diagnosis not present

## 2016-01-05 DIAGNOSIS — H4089 Other specified glaucoma: Secondary | ICD-10-CM | POA: Diagnosis not present

## 2016-01-05 DIAGNOSIS — I10 Essential (primary) hypertension: Secondary | ICD-10-CM | POA: Diagnosis not present

## 2016-01-05 DIAGNOSIS — M25562 Pain in left knee: Secondary | ICD-10-CM | POA: Diagnosis not present

## 2016-01-05 DIAGNOSIS — D6489 Other specified anemias: Secondary | ICD-10-CM | POA: Diagnosis not present

## 2016-01-05 DIAGNOSIS — R7301 Impaired fasting glucose: Secondary | ICD-10-CM | POA: Diagnosis not present

## 2016-01-06 DIAGNOSIS — M1711 Unilateral primary osteoarthritis, right knee: Secondary | ICD-10-CM | POA: Diagnosis not present

## 2016-01-06 DIAGNOSIS — M1712 Unilateral primary osteoarthritis, left knee: Secondary | ICD-10-CM | POA: Diagnosis not present

## 2016-01-06 DIAGNOSIS — M25562 Pain in left knee: Secondary | ICD-10-CM | POA: Diagnosis not present

## 2016-01-06 DIAGNOSIS — M25561 Pain in right knee: Secondary | ICD-10-CM | POA: Diagnosis not present

## 2016-01-14 DIAGNOSIS — M1712 Unilateral primary osteoarthritis, left knee: Secondary | ICD-10-CM | POA: Diagnosis not present

## 2016-01-14 DIAGNOSIS — M25561 Pain in right knee: Secondary | ICD-10-CM | POA: Diagnosis not present

## 2016-01-14 DIAGNOSIS — M1711 Unilateral primary osteoarthritis, right knee: Secondary | ICD-10-CM | POA: Diagnosis not present

## 2016-01-14 DIAGNOSIS — M25562 Pain in left knee: Secondary | ICD-10-CM | POA: Diagnosis not present

## 2016-01-21 DIAGNOSIS — M1712 Unilateral primary osteoarthritis, left knee: Secondary | ICD-10-CM | POA: Diagnosis not present

## 2016-01-21 DIAGNOSIS — M25561 Pain in right knee: Secondary | ICD-10-CM | POA: Diagnosis not present

## 2016-01-21 DIAGNOSIS — M25562 Pain in left knee: Secondary | ICD-10-CM | POA: Diagnosis not present

## 2016-01-21 DIAGNOSIS — M1711 Unilateral primary osteoarthritis, right knee: Secondary | ICD-10-CM | POA: Diagnosis not present

## 2016-01-29 DIAGNOSIS — M1711 Unilateral primary osteoarthritis, right knee: Secondary | ICD-10-CM | POA: Diagnosis not present

## 2016-01-29 DIAGNOSIS — M25562 Pain in left knee: Secondary | ICD-10-CM | POA: Diagnosis not present

## 2016-01-29 DIAGNOSIS — M1712 Unilateral primary osteoarthritis, left knee: Secondary | ICD-10-CM | POA: Diagnosis not present

## 2016-01-29 DIAGNOSIS — M25561 Pain in right knee: Secondary | ICD-10-CM | POA: Diagnosis not present

## 2016-02-05 DIAGNOSIS — M1712 Unilateral primary osteoarthritis, left knee: Secondary | ICD-10-CM | POA: Diagnosis not present

## 2016-02-05 DIAGNOSIS — M1711 Unilateral primary osteoarthritis, right knee: Secondary | ICD-10-CM | POA: Diagnosis not present

## 2016-02-05 DIAGNOSIS — M25562 Pain in left knee: Secondary | ICD-10-CM | POA: Diagnosis not present

## 2016-02-05 DIAGNOSIS — M25561 Pain in right knee: Secondary | ICD-10-CM | POA: Diagnosis not present

## 2016-05-03 DIAGNOSIS — Z6837 Body mass index (BMI) 37.0-37.9, adult: Secondary | ICD-10-CM | POA: Diagnosis not present

## 2016-05-03 DIAGNOSIS — I10 Essential (primary) hypertension: Secondary | ICD-10-CM | POA: Diagnosis not present

## 2016-05-03 DIAGNOSIS — R7301 Impaired fasting glucose: Secondary | ICD-10-CM | POA: Diagnosis not present

## 2016-05-03 DIAGNOSIS — G4709 Other insomnia: Secondary | ICD-10-CM | POA: Diagnosis not present

## 2016-05-03 DIAGNOSIS — R55 Syncope and collapse: Secondary | ICD-10-CM | POA: Diagnosis not present

## 2016-05-03 DIAGNOSIS — Z1389 Encounter for screening for other disorder: Secondary | ICD-10-CM | POA: Diagnosis not present

## 2016-05-24 ENCOUNTER — Ambulatory Visit (INDEPENDENT_AMBULATORY_CARE_PROVIDER_SITE_OTHER): Payer: Medicare HMO | Admitting: Cardiovascular Disease

## 2016-05-24 ENCOUNTER — Encounter: Payer: Self-pay | Admitting: Cardiovascular Disease

## 2016-05-24 ENCOUNTER — Encounter (INDEPENDENT_AMBULATORY_CARE_PROVIDER_SITE_OTHER): Payer: Self-pay

## 2016-05-24 VITALS — BP 135/75 | HR 84 | Ht 69.0 in | Wt 248.0 lb

## 2016-05-24 DIAGNOSIS — I10 Essential (primary) hypertension: Secondary | ICD-10-CM | POA: Diagnosis not present

## 2016-05-24 DIAGNOSIS — R55 Syncope and collapse: Secondary | ICD-10-CM

## 2016-05-24 NOTE — Progress Notes (Signed)
Cardiology Office Note   Date:  05/24/2016   ID:  Frank Hernandez, DOB 1940/12/04, MRN [id number]  PCP:  [name] (Inactive)  Cardiologist:   Mertie Moores, MD   Chief Complaint  Patient presents with  . Loss of Consciousness   Problem List 1.  Near syncope 2. Essential HTN    History of Present Illness: Frank Hernandez is a 76 y.o. male who presents for An episode of syncope. Seen with his wife , Frank Hernandez .  Has sudden episodes of sudden lightheadedness and sweats.  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  Does not walk or do any regular exercise   Each episode occurred while he was standing  This last episode occurred at 7:30 in the am  - had not eaten or taken his meds.  He had just had Doxazosin added to his medications when this happened.   Able to do his yard work without any problems.  Non smoker Non drinker   Past Medical History:  Diagnosis Date  . Bleeding from the nose   . Hypertension   . Pre-diabetes     Past Surgical History:  Procedure Laterality Date  . COLONOSCOPY    . KNEE ARTHROSCOPY Bilateral   . POLYPECTOMY       Current Outpatient Prescriptions  Medication Sig Dispense Refill  . amLODipine (NORVASC) 5 MG tablet Take 5 mg by mouth daily.      [name] Kitchen doxazosin (CARDURA) 1 MG tablet Take 1 mg by mouth daily.    . Ibuprofen (ADVIL) 200 MG CAPS Take 400 mg by mouth 3 (three) times a week.     . metFORMIN (GLUCOPHAGE-XR) 500 MG 24 hr tablet Take 500 mg by mouth daily.    . simvastatin (ZOCOR) 20 MG tablet Take 20 mg by mouth daily at 6 PM.     . valsartan-hydrochlorothiazide (DIOVAN-HCT) 160-12.5 MG tablet Take 1 tablet by mouth daily.     No current facility-administered medications for this visit.     No flowsheet data found.    Allergies:   Patient has no known allergies.    Social History:  The patient  reports that he has never smoked.  He has never used smokeless tobacco. He reports that he does not drink alcohol or use drugs.   Family History:  The patient's family history is not on file.    ROS:  Please see the history of present illness.    Review of Systems: Constitutional:  denies fever, chills, diaphoresis, appetite change and fatigue.  HEENT: denies photophobia, eye pain, redness, hearing loss, ear pain, congestion, sore throat, rhinorrhea, sneezing, neck pain, neck stiffness and tinnitus.  Respiratory: denies SOB, DOE, cough, chest tightness, and wheezing.  Cardiovascular: denies chest pain, palpitations and leg swelling.  Gastrointestinal: denies nausea, vomiting, abdominal pain, diarrhea, constipation, blood in stool.  Genitourinary: denies dysuria, urgency, frequency, hematuria, flank pain and difficulty urinating.  Musculoskeletal: denies  myalgias, back pain, joint swelling, arthralgias and gait problem.   Skin: denies pallor, rash and wound.  Neurological: admits to dizziness,     light-headedness,      Hematological: denies adenopathy, easy bruising, personal or family bleeding history.  Psychiatric/ Behavioral: denies suicidal ideation, mood changes, confusion, nervousness, sleep disturbance and agitation.       All other systems are reviewed and negative.    PHYSICAL EXAM: VS:  BP (!) 150/80 (BP Location: Right Arm, Patient Position: Sitting, Cuff Size: Large)   Pulse 84   Ht 5\' 9"  (1.753 m)   Wt 248 lb (112.5 kg)   SpO2 98%   BMI 36.62 kg/m  , BMI Body mass index is 36.62 kg/m. GEN: Well nourished, mildly obese male , in no acute distress  HEENT: normal  Neck: no JVD, carotid bruits, or masses Cardiac: RR; no murmurs, rubs, or gallops,no edema  Respiratory:  clear to auscultation bilaterally, normal work of breathing GI: soft, nontender, nondistended, + BS MS: no deformity or atrophy  Skin: warm and dry, no rash Neuro:  Strength and sensation are intact Psych: normal   EKG:  EKG is  ordered today. The ekg ordered today demonstrates  Accelerated junctional rhythm at 75 ( no P waves seen )    Recent Labs: 12/27/2015: BUN 26; Creatinine, Ser 0.95; Hemoglobin 9.3; Platelets 181; Potassium 4.0; Sodium 139    Lipid Panel No results found for: CHOL, TRIG, HDL, CHOLHDL, VLDL, LDLCALC, LDLDIRECT    Wt Readings from Last 3 Encounters:  05/24/16 248 lb (112.5 kg)  12/27/15 232 lb 11.2 oz (105.6 kg)  07/23/15 244 lb (110.7 kg)      Other studies Reviewed: Additional studies/ records that were reviewed today include: . Review of the above records demonstrates:    ASSESSMENT AND PLAN:  1.  Pre-syncope:   Frank Hernandez presents with symptoms of near-syncope. Some of his symptoms actions; orthostatic hypertension. He's on multiple antihypertensives which could have  caused this. I've encouraged him to make sure that  he stays well-hydrated. We will place a 30 day event monitor.. If he has further episodes we will discontinue his Cardura. I'll see him in 3 months for follow-up visit.  2. Essential hypertension: We'll continue current medications. I would have a low threshold to stop the Cardura if he has any further episodes of near-syncope.   Current medicines are reviewed at length with the patient today.  The patient does not have concerns regarding medicines.  Labs/ tests ordered today include:  No orders of the defined types were placed in this encounter.    Disposition:   FU with me in 3 months .      Mertie Moores, MD  05/24/2016 3:00 PM    [address], [place], [place]  [address] Phone: [phone]; Fax: [phone]

## 2016-05-24 NOTE — Patient Instructions (Signed)
Medication Instructions:  Your physician recommends that you continue on your current medications as directed. Please refer to the Current Medication list given to you today.   Labwork: None Ordered   Testing/Procedures: Your physician has recommended that you wear an event monitor. Event monitors are medical devices that record the heart's electrical activity. Doctors most often us these monitors to diagnose arrhythmias. Arrhythmias are problems with the speed or rhythm of the heartbeat. The monitor is a small, portable device. You can wear one while you do your normal daily activities. This is usually used to diagnose what is causing palpitations/syncope (passing out).   Follow-Up: Your physician recommends that you schedule a follow-up appointment in: 3 months with Dr. Nahser   If you need a refill on your cardiac medications before your next appointment, please call your pharmacy.   Thank you for choosing [facility]! Michelle Swinyer, RN [phone]    

## 2016-05-28 ENCOUNTER — Ambulatory Visit (INDEPENDENT_AMBULATORY_CARE_PROVIDER_SITE_OTHER): Payer: Medicare HMO

## 2016-05-28 DIAGNOSIS — R55 Syncope and collapse: Secondary | ICD-10-CM | POA: Diagnosis not present

## 2016-05-29 ENCOUNTER — Telehealth: Payer: Self-pay | Admitting: Internal Medicine

## 2016-05-29 NOTE — Telephone Encounter (Signed)
Called for asymptomatic pauses 3-4 seconds- no symptoms, sleeping by lif[employer]atch

## 2016-05-30 ENCOUNTER — Telehealth: Payer: Self-pay | Admitting: Physician Assistant

## 2016-05-30 NOTE — Telephone Encounter (Signed)
Received weekend answering service call from [facility] regarding this patient, had a 3.7sec pause while sleeping at 6:50am. They contacted the patient and he was asymptomatic. Continue to monitor. Strip being faxed to our office for Dr. Elmarie Shiley review. Dayna Dunn PA-C

## 2016-06-01 ENCOUNTER — Telehealth: Payer: Self-pay | Admitting: *Deleted

## 2016-06-01 ENCOUNTER — Telehealth: Payer: Self-pay | Admitting: Cardiovascular Disease

## 2016-06-01 NOTE — Telephone Encounter (Signed)
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 Reviewed w/ Dr. Lovena Le, DOD, no order received for episode.  Updated monitor order to notify for pauses 4 sec or greater during night time & continue to notify if experiences any pause/s during day/waking hours. Signed by Dr. Lovena Le.  Will forward to Dr. Acie Fredrickson for his FYI.

## 2016-06-01 NOTE — Telephone Encounter (Signed)
[employer] monitor company called, informing our office that monitor parameters order faxed earlier today cannot be accepted.  We cannot specify hours/time to notify.  Asked [place] to update order.  New order sent back stating: Fax only for pauses 3.0-3.9 sec and call for all pauses 4 sec or greater.

## 2016-06-01 NOTE — Telephone Encounter (Signed)
Will continue to monitor. This asymptomatic 3.7 sec pause while sleeping is concerning buy is probably not an indication for pacer. Will review when I get back. Continue to monitor. If he has additional puases, will have EP evaluate.

## 2016-06-01 NOTE — Telephone Encounter (Signed)
New message      Calling to report an abnormal ekg

## 2016-06-02 NOTE — Telephone Encounter (Signed)
We received 2 additional monitor reports of nocturnal pauses. Per Dr. Acie Fredrickson, he would like patient to be referred to EP. I spoke with Dr. Caryl Comes who is in the office today and he advised that it is permissible to schedule patient later in the month, that urgent consult is not needed. I spoke with patient's wife, [name] (DPR), and reviewed the plan of care with her. She verbalized understanding and agreement and thanked me for the call.

## 2016-06-02 NOTE — Telephone Encounter (Signed)
Agree with recommendations from Dr. Lovena Le

## 2016-06-03 ENCOUNTER — Telehealth: Payer: Self-pay | Admitting: *Deleted

## 2016-06-03 ENCOUNTER — Encounter: Payer: Self-pay | Admitting: Internal Medicine

## 2016-06-03 NOTE — Telephone Encounter (Signed)
Received monitor reports from 2:26 am for a 3 sec pause and from 4:25 am for a 3.8 sec pause.  Reviewed chart and notes from 06/02/16.  Called and spoke with patient's wife.  The patient had been asleep during both of these episodes until the monitor woke him.  She thanked me for calling to check on him and will keep already scheduled appointment with Dr. Lovena Le on 4/23.

## 2016-06-07 ENCOUNTER — Telehealth: Payer: Self-pay

## 2016-06-07 NOTE — Telephone Encounter (Signed)
Patient is on a cardiac event monitor and has had several pauses for the last three mornings while sleeping. Patient had a 3.3 second pause on 06/05/16 at 0421, 3.8 second pause on 06/06/16 at 0525, 3.1 second pause on 06/07/16 at 0201, and 3.1 second pause on 06/07/16 at 0545. Called patient and asked him how he was feeling. Patient stated he is feeling fine and he was sleeping at the time of all recorded pauses. Patient has an up coming appointment with Dr. Lovena Le. Dr. Lovena Le reviewed reports from all three days, no changes at this time.

## 2016-06-10 ENCOUNTER — Telehealth: Payer: Self-pay | Admitting: Cardiovascular Disease

## 2016-06-10 NOTE — Telephone Encounter (Addendum)
Called pt to see how he was doing . Pt on a cardiac event monitor.as previously pt has had heart episodes of pauses. On 06/09/2016 at 5:48 AM central time 3 second pause second degree with no symptoms.. 4/12, at 12:52 No symptoms second degree AV Block, and at 03:03 auto trigger  With no symptoms 3.4 seconds pause second degree AV block. Dr Burt Knack DOD aware of pt monitor report. Left pt a message to call back.[name] Kitchen

## 2016-06-10 NOTE — Telephone Encounter (Signed)
New message    Pt wife is calling stating pt had to change the patch on his heart monitor. She said when they changed it this morning, they had to move it some because his skin was irritated.

## 2016-06-10 NOTE — Telephone Encounter (Signed)
Pt's wife called to let the office know that just in case we received a call of any cardiac monitor changes, pt changes the monitor electrodes between 7 to 7:30 am today.. Under one electrode it was a small red spot she moved to another place and put neosporin to the redness site.

## 2016-06-11 NOTE — Telephone Encounter (Signed)
Noted. We continue to receive monitor reports with 3.0 to 3.4 sec nocturnal pauses. Will continue to monitor.

## 2016-06-14 ENCOUNTER — Telehealth: Payer: Self-pay | Admitting: Cardiovascular Disease

## 2016-06-14 NOTE — Telephone Encounter (Signed)
New message     Pt is wearing an event monitor.  He has no power and will have to turn his monitor off.  Just letting the nurse know incase the monitor people call us

## 2016-06-14 NOTE — Telephone Encounter (Signed)
Noted  

## 2016-06-21 ENCOUNTER — Encounter: Payer: Self-pay | Admitting: Internal Medicine

## 2016-06-21 ENCOUNTER — Ambulatory Visit (INDEPENDENT_AMBULATORY_CARE_PROVIDER_SITE_OTHER): Payer: Medicare HMO | Admitting: Internal Medicine

## 2016-06-21 VITALS — BP 140/80 | HR 87 | Ht 69.5 in | Wt 247.2 lb

## 2016-06-21 DIAGNOSIS — R55 Syncope and collapse: Secondary | ICD-10-CM

## 2016-06-21 NOTE — Patient Instructions (Signed)
Medication Instructions:  Your physician recommends that you continue on your current medications as directed. Please refer to the Current Medication list given to you today.    Labwork: None   Testing/Procedures: None   Follow-Up: Your physician recommends that you schedule a follow-up appointment as needed with Dr Lovena Le.        If you need a refill on your cardiac medications before your next appointment, please call your pharmacy.

## 2016-06-21 NOTE — Progress Notes (Signed)
HPI Mr. Frank Hernandez is referred today by Dr. Acie Fredrickson for evaluation of syncope. He is a pleasant 76 yo man with a h/o HTN who has had a couple of episodes of near passing out. They occurred while standing and were assoicated with taking his meds and not eating breakfast. The patient was given a heart monitor and noted to have pauses, mostly at night and associated with HR's in the 30's. He was also noted on his monitor to have HR's in the 140's during the day time. He has had a rash from wearing the heart monitor. He denies any loss of bowel or bladder continence.  No Known Allergies   Current Outpatient Prescriptions  Medication Sig Dispense Refill  . amLODipine (NORVASC) 5 MG tablet Take 5 mg by mouth daily.      [name] Kitchen doxazosin (CARDURA) 1 MG tablet Take 1 mg by mouth daily.    . Ibuprofen (ADVIL) 200 MG CAPS Take 400 mg by mouth 3 (three) times a week.     . metFORMIN (GLUCOPHAGE-XR) 500 MG 24 hr tablet Take 500 mg by mouth daily.    . simvastatin (ZOCOR) 20 MG tablet Take 20 mg by mouth daily at 6 PM.     . valsartan-hydrochlorothiazide (DIOVAN-HCT) 160-12.5 MG tablet Take 1 tablet by mouth daily.     No current facility-administered medications for this visit.      Past Medical History:  Diagnosis Date  . Bleeding from the nose   . Hypertension   . Pre-diabetes     ROS:   All systems reviewed and negative except as noted in the HPI.   Past Surgical History:  Procedure Laterality Date  . COLONOSCOPY    . KNEE ARTHROSCOPY Bilateral   . POLYPECTOMY       Family History  Problem Relation Age of Onset  . Colon cancer Neg Hx      Social History   Social History  . Marital status: Single    Spouse name: N/A  . Number of children: N/A  . Years of education: N/A   Occupational History  . Not on file.   Social History Main Topics  . Smoking status: Never Smoker  . Smokeless tobacco: Never Used  . Alcohol use No  . Drug use: No  . Sexual activity: Not on file     Other Topics Concern  . Not on file   Social History Narrative  . 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 Syncope - I strongly doubt his current episodes were brady mediated. I discussed the symptoms he might experience if he has bradycardia induced syncope. I have recommended watchful waiting. I suspect he will some day develop worsening conduction system disease and require permanent pacing but we are not there  yet. I also discussed his recieving an ILR but because he is not symptomatic will hold off on this for now. 2. HTN heart disease - his blood pressure is a bit elevated. Will follow. He is encouraged reduce his salt intake.  Mikle Bosworth.D.

## 2016-07-28 ENCOUNTER — Encounter: Payer: Self-pay | Admitting: Cardiovascular Disease

## 2016-08-16 ENCOUNTER — Ambulatory Visit: Payer: Medicare HMO | Admitting: Cardiovascular Disease

## 2016-09-13 DIAGNOSIS — G4709 Other insomnia: Secondary | ICD-10-CM | POA: Diagnosis not present

## 2016-09-13 DIAGNOSIS — E784 Other hyperlipidemia: Secondary | ICD-10-CM | POA: Diagnosis not present

## 2016-09-13 DIAGNOSIS — I10 Essential (primary) hypertension: Secondary | ICD-10-CM | POA: Diagnosis not present

## 2016-09-13 DIAGNOSIS — Z6838 Body mass index (BMI) 38.0-38.9, adult: Secondary | ICD-10-CM | POA: Diagnosis not present

## 2016-09-13 DIAGNOSIS — M25562 Pain in left knee: Secondary | ICD-10-CM | POA: Diagnosis not present

## 2016-09-13 DIAGNOSIS — R55 Syncope and collapse: Secondary | ICD-10-CM | POA: Diagnosis not present

## 2016-09-13 DIAGNOSIS — R600 Localized edema: Secondary | ICD-10-CM | POA: Diagnosis not present

## 2016-09-13 DIAGNOSIS — M25561 Pain in right knee: Secondary | ICD-10-CM | POA: Diagnosis not present

## 2016-09-13 DIAGNOSIS — R7301 Impaired fasting glucose: Secondary | ICD-10-CM | POA: Diagnosis not present

## 2016-10-12 DIAGNOSIS — I10 Essential (primary) hypertension: Secondary | ICD-10-CM | POA: Diagnosis not present

## 2016-10-25 DIAGNOSIS — H40013 Open angle with borderline findings, low risk, bilateral: Secondary | ICD-10-CM | POA: Diagnosis not present

## 2016-10-25 DIAGNOSIS — E119 Type 2 diabetes mellitus without complications: Secondary | ICD-10-CM | POA: Diagnosis not present

## 2016-10-25 DIAGNOSIS — H2513 Age-related nuclear cataract, bilateral: Secondary | ICD-10-CM | POA: Diagnosis not present

## 2016-10-25 DIAGNOSIS — H25013 Cortical age-related cataract, bilateral: Secondary | ICD-10-CM | POA: Diagnosis not present

## 2016-10-27 DIAGNOSIS — I10 Essential (primary) hypertension: Secondary | ICD-10-CM | POA: Diagnosis not present

## 2017-01-10 DIAGNOSIS — R7301 Impaired fasting glucose: Secondary | ICD-10-CM | POA: Diagnosis not present

## 2017-01-10 DIAGNOSIS — E7849 Other hyperlipidemia: Secondary | ICD-10-CM | POA: Diagnosis not present

## 2017-01-10 DIAGNOSIS — R82998 Other abnormal findings in urine: Secondary | ICD-10-CM | POA: Diagnosis not present

## 2017-01-10 DIAGNOSIS — Z125 Encounter for screening for malignant neoplasm of prostate: Secondary | ICD-10-CM | POA: Diagnosis not present

## 2017-01-10 DIAGNOSIS — I10 Essential (primary) hypertension: Secondary | ICD-10-CM | POA: Diagnosis not present

## 2017-01-17 DIAGNOSIS — E7849 Other hyperlipidemia: Secondary | ICD-10-CM | POA: Diagnosis not present

## 2017-01-17 DIAGNOSIS — Z6838 Body mass index (BMI) 38.0-38.9, adult: Secondary | ICD-10-CM | POA: Diagnosis not present

## 2017-01-17 DIAGNOSIS — M25561 Pain in right knee: Secondary | ICD-10-CM | POA: Diagnosis not present

## 2017-01-17 DIAGNOSIS — Z0000 Encounter for general adult medical examination without abnormal findings: Secondary | ICD-10-CM | POA: Diagnosis not present

## 2017-01-17 DIAGNOSIS — R7301 Impaired fasting glucose: Secondary | ICD-10-CM | POA: Diagnosis not present

## 2017-01-17 DIAGNOSIS — I10 Essential (primary) hypertension: Secondary | ICD-10-CM | POA: Diagnosis not present

## 2017-01-27 DIAGNOSIS — H40013 Open angle with borderline findings, low risk, bilateral: Secondary | ICD-10-CM | POA: Diagnosis not present

## 2017-01-27 DIAGNOSIS — H40033 Anatomical narrow angle, bilateral: Secondary | ICD-10-CM | POA: Diagnosis not present

## 2017-05-05 ENCOUNTER — Encounter (HOSPITAL_COMMUNITY): Payer: Self-pay | Admitting: Emergency Medicine

## 2017-05-05 ENCOUNTER — Emergency Department (HOSPITAL_COMMUNITY): Payer: Medicare HMO

## 2017-05-05 ENCOUNTER — Other Ambulatory Visit: Payer: Self-pay

## 2017-05-05 ENCOUNTER — Emergency Department (HOSPITAL_COMMUNITY)
Admission: EM | Admit: 2017-05-05 | Discharge: 2017-05-05 | Disposition: A | Discharge status: Home / Self Care | Payer: Medicare HMO | Attending: Physician Assistant | Admitting: Physician Assistant

## 2017-05-05 DIAGNOSIS — M545 Low back pain: Secondary | ICD-10-CM | POA: Diagnosis not present

## 2017-05-05 DIAGNOSIS — M25562 Pain in left knee: Secondary | ICD-10-CM | POA: Diagnosis not present

## 2017-05-05 DIAGNOSIS — S8992XA Unspecified injury of left lower leg, initial encounter: Secondary | ICD-10-CM | POA: Diagnosis not present

## 2017-05-05 DIAGNOSIS — R220 Localized swelling, mass and lump, head: Secondary | ICD-10-CM | POA: Insufficient documentation

## 2017-05-05 DIAGNOSIS — R51 Headache: Secondary | ICD-10-CM | POA: Insufficient documentation

## 2017-05-05 DIAGNOSIS — Z79899 Other long term (current) drug therapy: Secondary | ICD-10-CM | POA: Insufficient documentation

## 2017-05-05 DIAGNOSIS — M546 Pain in thoracic spine: Secondary | ICD-10-CM | POA: Diagnosis not present

## 2017-05-05 DIAGNOSIS — I10 Essential (primary) hypertension: Secondary | ICD-10-CM | POA: Diagnosis not present

## 2017-05-05 DIAGNOSIS — G4489 Other headache syndrome: Secondary | ICD-10-CM | POA: Diagnosis not present

## 2017-05-05 DIAGNOSIS — S098XXA Other specified injuries of head, initial encounter: Secondary | ICD-10-CM | POA: Diagnosis not present

## 2017-05-05 DIAGNOSIS — S0990XA Unspecified injury of head, initial encounter: Secondary | ICD-10-CM | POA: Diagnosis not present

## 2017-05-05 DIAGNOSIS — M549 Dorsalgia, unspecified: Secondary | ICD-10-CM | POA: Diagnosis not present

## 2017-05-05 DIAGNOSIS — M542 Cervicalgia: Secondary | ICD-10-CM | POA: Diagnosis not present

## 2017-05-05 DIAGNOSIS — S199XXA Unspecified injury of neck, initial encounter: Secondary | ICD-10-CM | POA: Diagnosis not present

## 2017-05-05 MED ORDER — IBUPROFEN 400 MG PO TABS
600.0000 mg | ORAL_TABLET | Freq: Once | ORAL | Status: AC
  Administered 2017-05-05: 600 mg via ORAL
  Filled 2017-05-05: qty 1
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
## 2017-05-05 NOTE — ED Notes (Signed)
See EDP secondary assessment.  

## 2017-05-05 NOTE — Discharge Instructions (Signed)
Your CT scan of the head and neck was reassuring.  X-rays of your spine and knee without broken bones.  Please take Tylenol as needed for pain.  Apply ice to the top of your head to help with the hematoma.  You can also apply heat to the lower back to help with your symptoms.  Please follow-up with your regular doctor in a week if your pain is not improving.  Return if you develop headache with vomiting, headache with vision loss or have any new or concerning symptoms.
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
## 2017-05-12 DIAGNOSIS — M1712 Unilateral primary osteoarthritis, left knee: Secondary | ICD-10-CM | POA: Diagnosis not present

## 2017-05-12 DIAGNOSIS — M25562 Pain in left knee: Secondary | ICD-10-CM | POA: Diagnosis not present

## 2017-05-13 DIAGNOSIS — H209 Unspecified iridocyclitis: Secondary | ICD-10-CM | POA: Diagnosis not present

## 2017-05-19 DIAGNOSIS — M1712 Unilateral primary osteoarthritis, left knee: Secondary | ICD-10-CM | POA: Diagnosis not present

## 2017-05-19 DIAGNOSIS — M25562 Pain in left knee: Secondary | ICD-10-CM | POA: Diagnosis not present

## 2017-05-19 DIAGNOSIS — M6281 Muscle weakness (generalized): Secondary | ICD-10-CM | POA: Diagnosis not present

## 2017-05-20 DIAGNOSIS — M6281 Muscle weakness (generalized): Secondary | ICD-10-CM | POA: Diagnosis not present

## 2017-05-20 DIAGNOSIS — M25562 Pain in left knee: Secondary | ICD-10-CM | POA: Diagnosis not present

## 2017-05-20 DIAGNOSIS — M1712 Unilateral primary osteoarthritis, left knee: Secondary | ICD-10-CM | POA: Diagnosis not present

## 2017-05-23 DIAGNOSIS — H209 Unspecified iridocyclitis: Secondary | ICD-10-CM | POA: Diagnosis not present

## 2017-05-25 DIAGNOSIS — M6281 Muscle weakness (generalized): Secondary | ICD-10-CM | POA: Diagnosis not present

## 2017-05-25 DIAGNOSIS — M1712 Unilateral primary osteoarthritis, left knee: Secondary | ICD-10-CM | POA: Diagnosis not present

## 2017-05-25 DIAGNOSIS — M25562 Pain in left knee: Secondary | ICD-10-CM | POA: Diagnosis not present

## 2017-05-31 DIAGNOSIS — M25562 Pain in left knee: Secondary | ICD-10-CM | POA: Diagnosis not present

## 2017-05-31 DIAGNOSIS — M6281 Muscle weakness (generalized): Secondary | ICD-10-CM | POA: Diagnosis not present

## 2017-05-31 DIAGNOSIS — M1712 Unilateral primary osteoarthritis, left knee: Secondary | ICD-10-CM | POA: Diagnosis not present

## 2017-06-07 DIAGNOSIS — M6281 Muscle weakness (generalized): Secondary | ICD-10-CM | POA: Diagnosis not present

## 2017-06-07 DIAGNOSIS — M1712 Unilateral primary osteoarthritis, left knee: Secondary | ICD-10-CM | POA: Diagnosis not present

## 2017-06-07 DIAGNOSIS — M25562 Pain in left knee: Secondary | ICD-10-CM | POA: Diagnosis not present

## 2017-06-09 DIAGNOSIS — M25562 Pain in left knee: Secondary | ICD-10-CM | POA: Diagnosis not present

## 2017-06-09 DIAGNOSIS — M1712 Unilateral primary osteoarthritis, left knee: Secondary | ICD-10-CM | POA: Diagnosis not present

## 2017-06-09 DIAGNOSIS — M6281 Muscle weakness (generalized): Secondary | ICD-10-CM | POA: Diagnosis not present

## 2017-06-14 DIAGNOSIS — M25562 Pain in left knee: Secondary | ICD-10-CM | POA: Diagnosis not present

## 2017-06-14 DIAGNOSIS — M6281 Muscle weakness (generalized): Secondary | ICD-10-CM | POA: Diagnosis not present

## 2017-06-14 DIAGNOSIS — M1712 Unilateral primary osteoarthritis, left knee: Secondary | ICD-10-CM | POA: Diagnosis not present

## 2017-06-28 DIAGNOSIS — M1712 Unilateral primary osteoarthritis, left knee: Secondary | ICD-10-CM | POA: Diagnosis not present

## 2017-06-28 DIAGNOSIS — M6281 Muscle weakness (generalized): Secondary | ICD-10-CM | POA: Diagnosis not present

## 2017-06-28 DIAGNOSIS — M25562 Pain in left knee: Secondary | ICD-10-CM | POA: Diagnosis not present

## 2017-12-05 DIAGNOSIS — H25013 Cortical age-related cataract, bilateral: Secondary | ICD-10-CM | POA: Diagnosis not present

## 2017-12-05 DIAGNOSIS — H40033 Anatomical narrow angle, bilateral: Secondary | ICD-10-CM | POA: Diagnosis not present

## 2017-12-05 DIAGNOSIS — H40013 Open angle with borderline findings, low risk, bilateral: Secondary | ICD-10-CM | POA: Diagnosis not present

## 2017-12-05 DIAGNOSIS — E119 Type 2 diabetes mellitus without complications: Secondary | ICD-10-CM | POA: Diagnosis not present

## 2018-01-19 DIAGNOSIS — Z6835 Body mass index (BMI) 35.0-35.9, adult: Secondary | ICD-10-CM | POA: Diagnosis not present

## 2018-01-19 DIAGNOSIS — Z1389 Encounter for screening for other disorder: Secondary | ICD-10-CM | POA: Diagnosis not present

## 2018-01-19 DIAGNOSIS — R3911 Hesitancy of micturition: Secondary | ICD-10-CM | POA: Diagnosis not present

## 2018-01-19 DIAGNOSIS — I10 Essential (primary) hypertension: Secondary | ICD-10-CM | POA: Diagnosis not present

## 2018-01-19 DIAGNOSIS — R7301 Impaired fasting glucose: Secondary | ICD-10-CM | POA: Diagnosis not present

## 2018-01-19 DIAGNOSIS — D649 Anemia, unspecified: Secondary | ICD-10-CM | POA: Diagnosis not present

## 2018-04-14 DIAGNOSIS — E7849 Other hyperlipidemia: Secondary | ICD-10-CM | POA: Diagnosis not present

## 2018-04-14 DIAGNOSIS — R7301 Impaired fasting glucose: Secondary | ICD-10-CM | POA: Diagnosis not present

## 2018-04-14 DIAGNOSIS — Z125 Encounter for screening for malignant neoplasm of prostate: Secondary | ICD-10-CM | POA: Diagnosis not present

## 2018-04-14 DIAGNOSIS — R82998 Other abnormal findings in urine: Secondary | ICD-10-CM | POA: Diagnosis not present

## 2018-04-14 DIAGNOSIS — I10 Essential (primary) hypertension: Secondary | ICD-10-CM | POA: Diagnosis not present

## 2018-04-21 DIAGNOSIS — D6489 Other specified anemias: Secondary | ICD-10-CM | POA: Diagnosis not present

## 2018-04-21 DIAGNOSIS — R7301 Impaired fasting glucose: Secondary | ICD-10-CM | POA: Diagnosis not present

## 2018-04-21 DIAGNOSIS — I10 Essential (primary) hypertension: Secondary | ICD-10-CM | POA: Diagnosis not present

## 2018-04-21 DIAGNOSIS — E668 Other obesity: Secondary | ICD-10-CM | POA: Diagnosis not present

## 2018-04-21 DIAGNOSIS — E7849 Other hyperlipidemia: Secondary | ICD-10-CM | POA: Diagnosis not present

## 2018-04-21 DIAGNOSIS — M25562 Pain in left knee: Secondary | ICD-10-CM | POA: Diagnosis not present

## 2018-04-21 DIAGNOSIS — R3911 Hesitancy of micturition: Secondary | ICD-10-CM | POA: Diagnosis not present

## 2018-04-21 DIAGNOSIS — Z0000 Encounter for general adult medical examination without abnormal findings: Secondary | ICD-10-CM | POA: Diagnosis not present

## 2018-04-21 DIAGNOSIS — G4709 Other insomnia: Secondary | ICD-10-CM | POA: Diagnosis not present

## 2018-04-25 DIAGNOSIS — Z1212 Encounter for screening for malignant neoplasm of rectum: Secondary | ICD-10-CM | POA: Diagnosis not present

## 2018-05-31 ENCOUNTER — Institutional Professional Consult (permissible substitution): Payer: Commercial Managed Care - HMO | Admitting: Neurology

## 2018-08-24 DIAGNOSIS — R3911 Hesitancy of micturition: Secondary | ICD-10-CM | POA: Diagnosis not present

## 2018-08-24 DIAGNOSIS — R7301 Impaired fasting glucose: Secondary | ICD-10-CM | POA: Diagnosis not present

## 2018-08-24 DIAGNOSIS — I10 Essential (primary) hypertension: Secondary | ICD-10-CM | POA: Diagnosis not present

## 2018-08-24 DIAGNOSIS — G4700 Insomnia, unspecified: Secondary | ICD-10-CM | POA: Diagnosis not present

## 2018-10-16 DIAGNOSIS — Z1159 Encounter for screening for other viral diseases: Secondary | ICD-10-CM | POA: Diagnosis not present

## 2018-11-15 ENCOUNTER — Encounter: Payer: Self-pay | Admitting: Neurology

## 2018-11-15 ENCOUNTER — Other Ambulatory Visit: Payer: Self-pay

## 2018-11-15 ENCOUNTER — Ambulatory Visit (INDEPENDENT_AMBULATORY_CARE_PROVIDER_SITE_OTHER): Payer: Medicare HMO | Admitting: Neurology

## 2018-11-15 VITALS — BP 176/85 | HR 59 | Temp 96.9°F | Ht 69.5 in | Wt 229.0 lb

## 2018-11-15 DIAGNOSIS — R7303 Prediabetes: Secondary | ICD-10-CM

## 2018-11-15 DIAGNOSIS — R55 Syncope and collapse: Secondary | ICD-10-CM | POA: Diagnosis not present

## 2018-11-15 DIAGNOSIS — I10 Essential (primary) hypertension: Secondary | ICD-10-CM | POA: Diagnosis not present

## 2018-11-15 NOTE — Progress Notes (Signed)
SLEEP MEDICINE CLINIC    Provider:  Larey Seat, MD  Primary Care Physician:  [name] (Inactive) [facility]     Referring Provider: [name], [facility] [place],  [place] [address]          Chief Complaint according to patient   Patient presents with:    . New Patient (Initial Visit)           HISTORY OF PRESENT ILLNESS:  KEITA LAVIS is a 78 y.o. year old  African American male patient seen here as a referral on 11/15/2018 from Dr. Philip Aspen.   Chief concern according to patient : " I had a sleep study 3 years ago but never chose to startt CPAP, by but now have worsening HTN"    I have the pleasure of seeing HORATIO ROYLE today, a right-handed [place] or [place] American male with a possible sleep disorder.  He  has a past medical history of Bleeding from the nose, Hypertension, and Pre-diabetes..  Knee arthritis.    The patient had a home sleep test in the year 2017  with unknown result .  Sleep relevant medical history: Nocturia 1 time , no Sleep walking, Tonsillectomy- yes, in childhood , no spine surgery/trauma / deviated septum / ENT.     Family medical /sleep history: No  other family member with a diagnosis of OSA. Father and brother were heavy drinkers. He was the oldest of 7 children.    Social history:" I tried to retire in 2002, wasn't for me "-   Patient is working as a Education administrator- and lives in a household with 2 persons/ . Family status is married , with one adult son ( age 28) , 66 grandson..  The patient currently works self-employed in daytime. [employer].  Pets are not present. Tobacco use: no.  ETOH use : no, Caffeine intake in form of Coffee( 1 cup) Soda( 1 day ) Tea ( rare ) or energy drinks. Regular exercise in form of walking and working.     Sleep habits are as follows: The patient's dinner time is between 7 PM. The patient goes to bed at 12.00 PM and has no  diffculties to fall asleep.  Bedroom is cool, quiet and  dark. He continues to sleep for 4-6  hours, wakes for one single bathroom break.  The preferred sleep position is laterally, with the support of 1 pillows on a flat mattress. Dreams are reportedly rare.  The patient wakes up spontaneously at 8 AM with an alarm at 7.30 on work days .  He reports usually feeling refreshed / restored in AM, without symptoms such as dry mouth or morning headaches, and rarely residual fatigue. Naps are taken infrequently, mostly when watching Tv or reading in PM. lasting from 20 to  30 minutes.    Review of Systems: Out of a complete 14 system review, the patient complains of only the following symptoms, and all other reviewed systems are negative.:    How likely are you to doze in the following situations: 0 = not likely, 1 = slight chance, 2 = moderate chance, 3 = high chance   Sitting and Reading? Watching Television? Sitting inactive in a public place (theater or meeting)? As a passenger in a car for an hour without a break? Lying down in the afternoon when circumstances permit? Sitting and talking to someone? Sitting quietly after lunch without alcohol? In a car, while stopped for a few  minutes in traffic?   Total = 6/ 24 points   FSS endorsed at 13/ 63 points.   Social History   Socioeconomic History  . Marital status: Married  to [employer] since 1968    Spouse name: Not on file  . Number of children: One son and [employer] graduate,  Environmental health practitioner   . Years of education: College   . Highest education level: Mechanics at [employer]   Occupational History  . Not on file  Social Needs  . Financial resource strain: Not on file  . Food insecurity    Worry: Not on file    Inability: Not on file  . Transportation needs    Medical: Not on file    Non-medical: Not on file  Tobacco Use  . Smoking status: Never Smoker  . Smokeless tobacco: Never Used  Substance and Sexual Activity  . Alcohol use: No    Alcohol/week: 0.0 standard drinks  . Drug  use: No  . Sexual activity: Not on file  Lifestyle  . Physical activity    Days per week: Not on file    Minutes per session: Not on file  . Stress: Not on file  Relationships  . Social Herbalist on phone: Not on file    Gets together: Not on file    Attends religious service: Not on file    Active member of club or organization: Not on file    Attends meetings of clubs or organizations: Not on file    Relationship status: Church going  Other Topics Concern  . Not on file  Social History Narrative  . Not on file    Family History  Problem Relation Age of Onset  . Colon cancer Neg Hx     Past Medical History:  Diagnosis Date  . Bleeding from the nose   . Hypertension   . Pre-diabetes     Past Surgical History:  Procedure Laterality Date  . COLONOSCOPY    . KNEE ARTHROSCOPY Bilateral   . POLYPECTOMY       Current Outpatient Medications on File Prior to Visit  Medication Sig Dispense Refill  . doxazosin (CARDURA) 1 MG tablet Take 1 mg by mouth daily.    . Ibuprofen (ADVIL) 200 MG CAPS Take 400 mg by mouth 3 (three) times a week.     . metFORMIN (GLUCOPHAGE-XR) 500 MG 24 hr tablet Take 500 mg by mouth daily.    . pravastatin (PRAVACHOL) 40 MG tablet Take 40 mg by mouth daily.    . valsartan-hydrochlorothiazide (DIOVAN-HCT) 160-12.5 MG tablet Take 1 tablet by mouth daily.     No current facility-administered medications on file prior to visit.     No Known Allergies  Physical exam:  Today's Vitals   11/15/18 0959  BP: (!) 176/85  Pulse: (!) 59  Temp: (!) 96.9 F (36.1 C)  Weight: 229 lb (103.9 kg)  Height: 5' 9.5" (1.765 m)   Body mass index is 33.33 kg/m.   Wt Readings from Last 3 Encounters:  11/15/18 229 lb (103.9 kg)  05/05/17 243 lb (110.2 kg)  06/21/16 247 lb 4 oz (112.2 kg)     Ht Readings from Last 3 Encounters:  11/15/18 5' 9.5" (1.765 m)  05/05/17 5' 9.5" (1.765 m)  06/21/16 5' 9.5" (1.765 m)      General: The patient is  awake, alert and appears not in acute distress. The patient is well groomed. Head:  Normocephalic, atraumatic. Neck is supple. Mallampati 5,  neck circumference:15 inches . Nasal airflow patent.   Retrognathia is not seen.  Dental status: edentulous.  Cardiovascular:  Regular rate and cardiac rhythm by pulse,  without distended neck veins. Respiratory: Lungs are clear to auscultation.  Skin:  Without evidence of ankle edema, or rash. Trunk: The patient's posture is erect.   Neurologic exam : The patient is awake and alert, oriented to place and time.   Memory subjective described as intact.  Attention span & concentration ability appears normal.  Speech is fluent,  without  dysarthria, dysphonia or aphasia.  Mood and affect are appropriate.   Cranial nerves: no loss of smell or taste reported  Pupils are equal and briskly reactive to light. Funduscopic exam deferred..  Extraocular movements in vertical and horizontal planes were intact and without nystagmus. No Diplopia. Visual fields by finger perimetry are intact. Hearing was intact to soft voice and finger rubbing.    Facial sensation intact to fine touch.  Facial motor strength is symmetric and tongue and uvula move midline.  Neck ROM : rotation, tilt and flexion extension were normal for age and shoulder shrug was symmetrical.    Motor exam:  Symmetric bulk, tone and ROM.   Normal tone without cog wheeling, symmetric grip strength .   Sensory:  Fine touch, pinprick and vibration were tested  and  normal.  Proprioception tested in the upper extremities was normal.   Coordination: Rapid alternating movements in the fingers/hands were of normal speed.  The Finger-to-nose maneuver was intact without evidence of ataxia, dysmetria or tremor.   Gait and station: Patient could rise unassisted from a seated position, walked without assistive device.  Stance is of normal width/ base and the patient turned with 3 steps.  Toe and heel  walk were deferred.  Deep tendon reflexes: in the  upper and lower extremities are symmetric and intact.  There was an attenuation of both patella reflexes.  Babinski response was deferred.        After spending a total time of  35  minutes face to face and additional time for physical and neurologic examination, review of laboratory studies,  personal review of imaging studies, reports and results of other testing and review of referral information / records as far as provided in visit, I have established the following assessments:  1) Mr. Harling presents with restorative- refreshing sleep almost nightly he has no symptoms that would lead to assume he hs OSA,. Physically he has a high grade Mallampati and no teeth,  BMI 33 is a moderate risk factor.  He is unconcerned about sleep- except for some higher BP readings. He reportedly has asked his wife of 36 years and she denied that he snores.     My Plan is to proceed with:  1) HST for apnea screening.     I would like to thank [name], MD [address] [place],  [place] [address] for allowing me to meet with and to take care of this pleasant patient.   In short, COURTEZ MAYERNIK is to be followed up either personally or through our NP within 2-3  month.   CC: I will share my notes with PCP. [name] Kitchen  Electronically signed by: Larey Seat, MD 11/15/2018 10:35 AM  [facility] Board certified by The [employer] of Sleep Medicine and Diplomate of the [employer] of Sleep Medicine. Board certified In Neurology through the [place], Fellow of  the [employer] of Neurology. Medical Director of [facility].

## 2018-12-04 ENCOUNTER — Other Ambulatory Visit: Payer: Self-pay

## 2018-12-04 ENCOUNTER — Ambulatory Visit (INDEPENDENT_AMBULATORY_CARE_PROVIDER_SITE_OTHER): Payer: Medicare HMO | Admitting: Neurology

## 2018-12-04 DIAGNOSIS — G4710 Hypersomnia, unspecified: Secondary | ICD-10-CM

## 2018-12-04 DIAGNOSIS — R55 Syncope and collapse: Secondary | ICD-10-CM

## 2018-12-04 DIAGNOSIS — I10 Essential (primary) hypertension: Secondary | ICD-10-CM

## 2018-12-04 DIAGNOSIS — R351 Nocturia: Secondary | ICD-10-CM | POA: Diagnosis not present

## 2018-12-04 DIAGNOSIS — N401 Enlarged prostate with lower urinary tract symptoms: Secondary | ICD-10-CM | POA: Diagnosis not present

## 2018-12-04 DIAGNOSIS — R7303 Prediabetes: Secondary | ICD-10-CM

## 2018-12-06 NOTE — Procedures (Signed)
  Patient Information     First Name: Frank Last Name: Hernandez ID: [id number]  Birth Date: 1941-01-06 Age: 78 Gender: Male  Referring Provider: [name], MD BMI: 32.8 (W=229 lb, H=5' 10'')  Neck Circ.:  15 '' Epworth:  6/24   Sleep Study Information    Study Date: Dec 04, 2018 S/H/A Version: 001.001.001.001 / 4.1.1528 / 55  History:    [name] is a 78 year old , right-handed AA male patient ,seen on 11-15-2018.  He has a medical history of Epistaxis, Hypertension, and Pre-diabetes, Knee arthritis. The patient had a home sleep test in the year 2017 with unknown result. He was asked to start CPAP however but chose not to. He still works, runs a [employer].               Summary & Diagnosis:     In 7 hours of recording there was an AHI of 1.9/h noted- indicative of no apnea being present.  The device indicated that the patient slept mostly prone, did not snore, and had no hypoxia. There was no tachycardia.  Recommendations:      Normal HST, without Sleep Apnea.  Electronically Signed: Larey Seat, MD             Sleep Summary  Oxygen Saturation Statistics   Start Study Time: End Study Time: Total Recording Time:  11:05:06 PM   7:07:11 AM   8 h, 2 min  Total Sleep Time % REM of Sleep Time:  7 h, 0 min  29.1    Mean: 97 Minimum: 94 Maximum: 100  Mean of Desaturations Nadirs (%):   93  Oxygen Desaturation. %:   4-9 10-20 >20 Total  Events Number Total    5  1 83.3 16.7  0 0.0  6 100.0  Oxygen Saturation: <90 <=88 <85 <80 <70  Duration (minutes): Sleep % 0.0 0.0  0.0 0.0  0.0 0.0 0.0 0.0 0.0 0.0     Respiratory Indices      Total Events REM NREM All Night  pRDI:  17  pAHI:  12 ODI:  6  pAHIc:  0  % CSR: 0.0 7.2 4.8 2.4 0.0 1.1 0.9 0.4 0.0 2.7 1.9 0.9 0.0       Pulse Rate Statistics during Sleep (BPM)      Mean: 46 Minimum: N/A Maximum: 80    Indices are calculated using technically valid sleep time of  6 hrs, 21  min. Central-Indices are calculated using technically valid sleep time of  5  hrs, 11 min. pRDI/pAHI are calculated using oxi desaturations ? 3%  Body Position Statistics  Position Supine Prone Right Left Non-Supine  Sleep (min) 37.0 363.1 20.0 0.0 383.1  Sleep % 8.8 86.4 4.8 0.0 91.2  pRDI 1.8 2.7 3.7 N/A 2.8  pAHI 1.8 1.8 3.7 N/A 1.9  ODI 0.0 0.7 7.4 N/A 1.0     Snoring Statistics Snoring Level (dB) >40 >50 >60 >70 >80 >Threshold (45)  Sleep (min) 60.7 5.8 1.1 0.0 0.0 12.1  Sleep % 14.4 1.4 0.3 0.0 0.0 2.9    Mean: 41 dB Sleep Stages Chart

## 2018-12-11 ENCOUNTER — Telehealth: Payer: Self-pay | Admitting: Neurology

## 2018-12-11 NOTE — Telephone Encounter (Signed)
-----   Message from Larey Seat, MD sent at 12/06/2018  5:06 PM EDT ----- There was mild snoring and non continuous snoring recorded, low heart rate, but no apnea and no hypoemia.   Cc [name], MD

## 2018-12-11 NOTE — Telephone Encounter (Signed)
Called and reviewed the HST with the patient. Advised it was a Normal sleep study. There was no apnea present, oxygen concerns or heart rate concerns. Pt verbalized understanding. Pt had no questions at this time but was encouraged to call back if questions arise.

## 2019-04-02 DIAGNOSIS — H353131 Nonexudative age-related macular degeneration, bilateral, early dry stage: Secondary | ICD-10-CM | POA: Diagnosis not present

## 2019-04-02 DIAGNOSIS — E119 Type 2 diabetes mellitus without complications: Secondary | ICD-10-CM | POA: Diagnosis not present

## 2019-04-02 DIAGNOSIS — H2513 Age-related nuclear cataract, bilateral: Secondary | ICD-10-CM | POA: Diagnosis not present

## 2019-04-02 DIAGNOSIS — H401131 Primary open-angle glaucoma, bilateral, mild stage: Secondary | ICD-10-CM | POA: Diagnosis not present

## 2019-09-04 DIAGNOSIS — H40033 Anatomical narrow angle, bilateral: Secondary | ICD-10-CM | POA: Diagnosis not present

## 2019-09-04 DIAGNOSIS — H401131 Primary open-angle glaucoma, bilateral, mild stage: Secondary | ICD-10-CM | POA: Diagnosis not present

## 2020-03-05 DIAGNOSIS — M25561 Pain in right knee: Secondary | ICD-10-CM | POA: Diagnosis not present

## 2020-03-05 DIAGNOSIS — M170 Bilateral primary osteoarthritis of knee: Secondary | ICD-10-CM | POA: Diagnosis not present

## 2020-03-05 DIAGNOSIS — M1711 Unilateral primary osteoarthritis, right knee: Secondary | ICD-10-CM | POA: Diagnosis not present

## 2020-03-05 DIAGNOSIS — M1712 Unilateral primary osteoarthritis, left knee: Secondary | ICD-10-CM | POA: Diagnosis not present

## 2020-03-05 DIAGNOSIS — M25562 Pain in left knee: Secondary | ICD-10-CM | POA: Diagnosis not present

## 2020-05-05 DIAGNOSIS — Z125 Encounter for screening for malignant neoplasm of prostate: Secondary | ICD-10-CM | POA: Diagnosis not present

## 2020-05-05 DIAGNOSIS — I10 Essential (primary) hypertension: Secondary | ICD-10-CM | POA: Diagnosis not present

## 2020-05-05 DIAGNOSIS — E785 Hyperlipidemia, unspecified: Secondary | ICD-10-CM | POA: Diagnosis not present

## 2020-05-05 DIAGNOSIS — R82998 Other abnormal findings in urine: Secondary | ICD-10-CM | POA: Diagnosis not present

## 2020-05-05 DIAGNOSIS — R7301 Impaired fasting glucose: Secondary | ICD-10-CM | POA: Diagnosis not present

## 2020-05-12 DIAGNOSIS — E669 Obesity, unspecified: Secondary | ICD-10-CM | POA: Diagnosis not present

## 2020-05-12 DIAGNOSIS — M25562 Pain in left knee: Secondary | ICD-10-CM | POA: Diagnosis not present

## 2020-05-12 DIAGNOSIS — E785 Hyperlipidemia, unspecified: Secondary | ICD-10-CM | POA: Diagnosis not present

## 2020-05-12 DIAGNOSIS — Z0000 Encounter for general adult medical examination without abnormal findings: Secondary | ICD-10-CM | POA: Diagnosis not present

## 2020-05-12 DIAGNOSIS — Z1212 Encounter for screening for malignant neoplasm of rectum: Secondary | ICD-10-CM | POA: Diagnosis not present

## 2020-05-12 DIAGNOSIS — R7301 Impaired fasting glucose: Secondary | ICD-10-CM | POA: Diagnosis not present

## 2020-05-12 DIAGNOSIS — M25561 Pain in right knee: Secondary | ICD-10-CM | POA: Diagnosis not present

## 2020-05-12 DIAGNOSIS — I10 Essential (primary) hypertension: Secondary | ICD-10-CM | POA: Diagnosis not present

## 2020-05-12 DIAGNOSIS — Z1339 Encounter for screening examination for other mental health and behavioral disorders: Secondary | ICD-10-CM | POA: Diagnosis not present

## 2020-05-12 DIAGNOSIS — Z1331 Encounter for screening for depression: Secondary | ICD-10-CM | POA: Diagnosis not present

## 2020-06-10 ENCOUNTER — Other Ambulatory Visit: Payer: Self-pay

## 2020-06-10 ENCOUNTER — Encounter (HOSPITAL_COMMUNITY): Payer: Self-pay | Admitting: Emergency Medicine

## 2020-06-10 ENCOUNTER — Inpatient Hospital Stay (HOSPITAL_COMMUNITY)
Admission: EM | Admit: 2020-06-10 | Discharge: 2020-06-24 | DRG: 481 | Disposition: A | Discharge status: Skilled Nursing Facility | Payer: Medicare HMO | Attending: General Surgery | Admitting: General Surgery

## 2020-06-10 ENCOUNTER — Emergency Department (HOSPITAL_COMMUNITY): Payer: Medicare HMO

## 2020-06-10 DIAGNOSIS — R278 Other lack of coordination: Secondary | ICD-10-CM | POA: Diagnosis not present

## 2020-06-10 DIAGNOSIS — Z419 Encounter for procedure for purposes other than remedying health state, unspecified: Secondary | ICD-10-CM

## 2020-06-10 DIAGNOSIS — I129 Hypertensive chronic kidney disease with stage 1 through stage 4 chronic kidney disease, or unspecified chronic kidney disease: Secondary | ICD-10-CM | POA: Diagnosis present

## 2020-06-10 DIAGNOSIS — H40033 Anatomical narrow angle, bilateral: Secondary | ICD-10-CM | POA: Diagnosis not present

## 2020-06-10 DIAGNOSIS — R41841 Cognitive communication deficit: Secondary | ICD-10-CM | POA: Diagnosis not present

## 2020-06-10 DIAGNOSIS — M5127 Other intervertebral disc displacement, lumbosacral region: Secondary | ICD-10-CM | POA: Diagnosis not present

## 2020-06-10 DIAGNOSIS — S72142A Displaced intertrochanteric fracture of left femur, initial encounter for closed fracture: Principal | ICD-10-CM | POA: Diagnosis present

## 2020-06-10 DIAGNOSIS — Z79899 Other long term (current) drug therapy: Secondary | ICD-10-CM | POA: Diagnosis not present

## 2020-06-10 DIAGNOSIS — D649 Anemia, unspecified: Secondary | ICD-10-CM | POA: Diagnosis not present

## 2020-06-10 DIAGNOSIS — Z6832 Body mass index (BMI) 32.0-32.9, adult: Secondary | ICD-10-CM | POA: Diagnosis not present

## 2020-06-10 DIAGNOSIS — M25561 Pain in right knee: Secondary | ICD-10-CM | POA: Diagnosis not present

## 2020-06-10 DIAGNOSIS — E663 Overweight: Secondary | ICD-10-CM | POA: Diagnosis present

## 2020-06-10 DIAGNOSIS — D62 Acute posthemorrhagic anemia: Secondary | ICD-10-CM | POA: Diagnosis not present

## 2020-06-10 DIAGNOSIS — S2249XA Multiple fractures of ribs, unspecified side, initial encounter for closed fracture: Secondary | ICD-10-CM | POA: Diagnosis not present

## 2020-06-10 DIAGNOSIS — Y92410 Unspecified street and highway as the place of occurrence of the external cause: Secondary | ICD-10-CM | POA: Diagnosis not present

## 2020-06-10 DIAGNOSIS — S72142D Displaced intertrochanteric fracture of left femur, subsequent encounter for closed fracture with routine healing: Secondary | ICD-10-CM | POA: Diagnosis not present

## 2020-06-10 DIAGNOSIS — S0003XA Contusion of scalp, initial encounter: Secondary | ICD-10-CM | POA: Diagnosis not present

## 2020-06-10 DIAGNOSIS — S2241XA Multiple fractures of ribs, right side, initial encounter for closed fracture: Secondary | ICD-10-CM

## 2020-06-10 DIAGNOSIS — Z7984 Long term (current) use of oral hypoglycemic drugs: Secondary | ICD-10-CM

## 2020-06-10 DIAGNOSIS — R197 Diarrhea, unspecified: Secondary | ICD-10-CM | POA: Diagnosis not present

## 2020-06-10 DIAGNOSIS — T148XXA Other injury of unspecified body region, initial encounter: Secondary | ICD-10-CM

## 2020-06-10 DIAGNOSIS — Z20822 Contact with and (suspected) exposure to covid-19: Secondary | ICD-10-CM | POA: Diagnosis present

## 2020-06-10 DIAGNOSIS — Z743 Need for continuous supervision: Secondary | ICD-10-CM | POA: Diagnosis not present

## 2020-06-10 DIAGNOSIS — S0990XA Unspecified injury of head, initial encounter: Secondary | ICD-10-CM | POA: Diagnosis not present

## 2020-06-10 DIAGNOSIS — S72002A Fracture of unspecified part of neck of left femur, initial encounter for closed fracture: Secondary | ICD-10-CM

## 2020-06-10 DIAGNOSIS — Z8719 Personal history of other diseases of the digestive system: Secondary | ICD-10-CM

## 2020-06-10 DIAGNOSIS — K5730 Diverticulosis of large intestine without perforation or abscess without bleeding: Secondary | ICD-10-CM | POA: Diagnosis not present

## 2020-06-10 DIAGNOSIS — S0993XA Unspecified injury of face, initial encounter: Secondary | ICD-10-CM | POA: Diagnosis not present

## 2020-06-10 DIAGNOSIS — M2578 Osteophyte, vertebrae: Secondary | ICD-10-CM | POA: Diagnosis not present

## 2020-06-10 DIAGNOSIS — R7303 Prediabetes: Secondary | ICD-10-CM | POA: Diagnosis present

## 2020-06-10 DIAGNOSIS — H401131 Primary open-angle glaucoma, bilateral, mild stage: Secondary | ICD-10-CM | POA: Diagnosis not present

## 2020-06-10 DIAGNOSIS — N1831 Chronic kidney disease, stage 3a: Secondary | ICD-10-CM | POA: Diagnosis present

## 2020-06-10 DIAGNOSIS — M6281 Muscle weakness (generalized): Secondary | ICD-10-CM | POA: Diagnosis not present

## 2020-06-10 DIAGNOSIS — N179 Acute kidney failure, unspecified: Secondary | ICD-10-CM | POA: Diagnosis not present

## 2020-06-10 DIAGNOSIS — H2513 Age-related nuclear cataract, bilateral: Secondary | ICD-10-CM | POA: Diagnosis not present

## 2020-06-10 DIAGNOSIS — R2689 Other abnormalities of gait and mobility: Secondary | ICD-10-CM | POA: Diagnosis not present

## 2020-06-10 DIAGNOSIS — I951 Orthostatic hypotension: Secondary | ICD-10-CM | POA: Diagnosis not present

## 2020-06-10 DIAGNOSIS — R52 Pain, unspecified: Secondary | ICD-10-CM

## 2020-06-10 DIAGNOSIS — S7222XA Displaced subtrochanteric fracture of left femur, initial encounter for closed fracture: Secondary | ICD-10-CM | POA: Diagnosis present

## 2020-06-10 DIAGNOSIS — S299XXA Unspecified injury of thorax, initial encounter: Secondary | ICD-10-CM | POA: Diagnosis not present

## 2020-06-10 DIAGNOSIS — S72141A Displaced intertrochanteric fracture of right femur, initial encounter for closed fracture: Secondary | ICD-10-CM | POA: Diagnosis not present

## 2020-06-10 DIAGNOSIS — M7732 Calcaneal spur, left foot: Secondary | ICD-10-CM | POA: Diagnosis not present

## 2020-06-10 DIAGNOSIS — I10 Essential (primary) hypertension: Secondary | ICD-10-CM | POA: Diagnosis not present

## 2020-06-10 DIAGNOSIS — R279 Unspecified lack of coordination: Secondary | ICD-10-CM | POA: Diagnosis not present

## 2020-06-10 DIAGNOSIS — Z041 Encounter for examination and observation following transport accident: Secondary | ICD-10-CM | POA: Diagnosis not present

## 2020-06-10 DIAGNOSIS — S2241XD Multiple fractures of ribs, right side, subsequent encounter for fracture with routine healing: Secondary | ICD-10-CM | POA: Diagnosis not present

## 2020-06-10 DIAGNOSIS — M419 Scoliosis, unspecified: Secondary | ICD-10-CM | POA: Diagnosis not present

## 2020-06-10 DIAGNOSIS — S72009A Fracture of unspecified part of neck of unspecified femur, initial encounter for closed fracture: Secondary | ICD-10-CM | POA: Diagnosis not present

## 2020-06-10 DIAGNOSIS — S7292XA Unspecified fracture of left femur, initial encounter for closed fracture: Secondary | ICD-10-CM | POA: Diagnosis not present

## 2020-06-10 DIAGNOSIS — S3992XA Unspecified injury of lower back, initial encounter: Secondary | ICD-10-CM | POA: Diagnosis not present

## 2020-06-10 DIAGNOSIS — E876 Hypokalemia: Secondary | ICD-10-CM

## 2020-06-10 DIAGNOSIS — E119 Type 2 diabetes mellitus without complications: Secondary | ICD-10-CM | POA: Diagnosis not present

## 2020-06-10 DIAGNOSIS — T1490XA Injury, unspecified, initial encounter: Secondary | ICD-10-CM

## 2020-06-10 LAB — CBC
HCT: 31 % — ABNORMAL LOW (ref 39.0–52.0)
Hemoglobin: 10.1 g/dL — ABNORMAL LOW (ref 13.0–17.0)
MCH: 31 pg (ref 26.0–34.0)
MCHC: 32.6 g/dL (ref 30.0–36.0)
MCV: 95.1 fL (ref 80.0–100.0)
Platelets: 197 10*3/uL (ref 150–400)
RBC: 3.26 MIL/uL — ABNORMAL LOW (ref 4.22–5.81)
RDW: 14.5 % (ref 11.5–15.5)
WBC: 5.3 10*3/uL (ref 4.0–10.5)
nRBC: 0 % (ref 0.0–0.2)

## 2020-06-10 LAB — RESP PANEL BY RT-PCR (FLU A&B, COVID) ARPGX2
Influenza A by PCR: NEGATIVE
Influenza B by PCR: NEGATIVE
SARS Coronavirus 2 by RT PCR: NEGATIVE

## 2020-06-10 LAB — COMPREHENSIVE METABOLIC PANEL
ALT: 18 U/L (ref 0–44)
AST: 31 U/L (ref 15–41)
Albumin: 3.2 g/dL — ABNORMAL LOW (ref 3.5–5.0)
Alkaline Phosphatase: 71 U/L (ref 38–126)
Anion gap: 7 (ref 5–15)
BUN: 23 mg/dL (ref 8–23)
CO2: 21 mmol/L — ABNORMAL LOW (ref 22–32)
Calcium: 8.7 mg/dL — ABNORMAL LOW (ref 8.9–10.3)
Chloride: 109 mmol/L (ref 98–111)
Creatinine, Ser: 1.78 mg/dL — ABNORMAL HIGH (ref 0.61–1.24)
GFR, Estimated: 38 mL/min — ABNORMAL LOW (ref >60)
Glucose, Bld: 139 mg/dL — ABNORMAL HIGH (ref 70–99)
Potassium: 4.1 mmol/L (ref 3.5–5.1)
Sodium: 137 mmol/L (ref 135–145)
Total Bilirubin: 0.9 mg/dL (ref 0.3–1.2)
Total Protein: 5.9 g/dL — ABNORMAL LOW (ref 6.5–8.1)

## 2020-06-10 LAB — I-STAT CHEM 8, ED
BUN: 29 mg/dL — ABNORMAL HIGH (ref 8–23)
Calcium, Ion: 1.21 mmol/L (ref 1.15–1.40)
Chloride: 111 mmol/L (ref 98–111)
Creatinine, Ser: 1.7 mg/dL — ABNORMAL HIGH (ref 0.61–1.24)
Glucose, Bld: 132 mg/dL — ABNORMAL HIGH (ref 70–99)
HCT: 30 % — ABNORMAL LOW (ref 39.0–52.0)
Hemoglobin: 10.2 g/dL — ABNORMAL LOW (ref 13.0–17.0)
Potassium: 4.2 mmol/L (ref 3.5–5.1)
Sodium: 141 mmol/L (ref 135–145)
TCO2: 23 mmol/L (ref 22–32)

## 2020-06-10 LAB — TROPONIN I (HIGH SENSITIVITY): Troponin I (High Sensitivity): 11 ng/L (ref <18)

## 2020-06-10 LAB — ETHANOL: Alcohol, Ethyl (B): <10 mg/dL (ref <10)

## 2020-06-10 LAB — CBG MONITORING, ED: Glucose-Capillary: 179 mg/dL — ABNORMAL HIGH (ref 70–99)

## 2020-06-10 MED ORDER — METOPROLOL TARTRATE 5 MG/5ML IV SOLN
5.0000 mg | Freq: Four times a day (QID) | INTRAVENOUS | Status: DC | PRN
Start: 2020-06-10 — End: 2020-06-25

## 2020-06-10 MED ORDER — ONDANSETRON HCL 4 MG/2ML IJ SOLN
4.0000 mg | Freq: Once | INTRAMUSCULAR | Status: AC
Start: 2020-06-10 — End: 2020-06-10
  Administered 2020-06-10: 4 mg via INTRAVENOUS
  Filled 2020-06-10: qty 2

## 2020-06-10 MED ORDER — MORPHINE SULFATE (PF) 2 MG/ML IV SOLN
2.0000 mg | INTRAVENOUS | Status: DC | PRN
Start: 2020-06-10 — End: 2020-06-11
  Administered 2020-06-11: 2 mg via INTRAVENOUS
  Administered 2020-06-11: 4 mg via INTRAVENOUS
  Filled 2020-06-10: qty 1
  Filled 2020-06-10: qty 2

## 2020-06-10 MED ORDER — MORPHINE SULFATE (PF) 4 MG/ML IV SOLN
4.0000 mg | Freq: Once | INTRAVENOUS | Status: AC
  Administered 2020-06-10: 4 mg via INTRAVENOUS
  Filled 2020-06-10: qty 1

## 2020-06-10 MED ORDER — ONDANSETRON HCL 4 MG/2ML IJ SOLN
4.0000 mg | Freq: Four times a day (QID) | INTRAMUSCULAR | Status: DC | PRN
  Administered 2020-06-11: 4 mg via INTRAVENOUS
  Filled 2020-06-10: qty 2

## 2020-06-10 MED ORDER — DEXTROSE-NACL 5-0.45 % IV SOLN: INTRAVENOUS | Status: DC

## 2020-06-10 MED ORDER — ENOXAPARIN SODIUM 30 MG/0.3ML ~~LOC~~ SOLN: 30.0000 mg | Freq: Two times a day (BID) | SUBCUTANEOUS | Status: DC

## 2020-06-10 MED ORDER — DOCUSATE SODIUM 100 MG PO CAPS
100.0000 mg | ORAL_CAPSULE | Freq: Two times a day (BID) | ORAL | Status: DC
  Administered 2020-06-10: 100 mg via ORAL
  Filled 2020-06-10: qty 1

## 2020-06-10 MED ORDER — IOHEXOL 300 MG/ML  SOLN
80.0000 mL | Freq: Once | INTRAMUSCULAR | Status: AC | PRN
  Administered 2020-06-10: 80 mL via INTRAVENOUS

## 2020-06-10 MED ORDER — ONDANSETRON 4 MG PO TBDP: 4.0000 mg | ORAL_TABLET | Freq: Four times a day (QID) | ORAL | Status: DC | PRN

## 2020-06-10 MED ORDER — INSULIN ASPART 100 UNIT/ML ~~LOC~~ SOLN
0.0000 [IU] | SUBCUTANEOUS | Status: DC
  Administered 2020-06-11 (×2): 2 [IU] via SUBCUTANEOUS
  Administered 2020-06-11 (×2): 3 [IU] via SUBCUTANEOUS
  Administered 2020-06-12 (×3): 2 [IU] via SUBCUTANEOUS
  Administered 2020-06-12: 3 [IU] via SUBCUTANEOUS
  Administered 2020-06-13 – 2020-06-15 (×3): 2 [IU] via SUBCUTANEOUS

## 2020-06-10 MED ORDER — SODIUM CHLORIDE 0.9 % IV BOLUS
1000.0000 mL | Freq: Once | INTRAVENOUS | Status: AC
  Administered 2020-06-10: 1000 mL via INTRAVENOUS

## 2020-06-10 MED ORDER — ACETAMINOPHEN 325 MG PO TABS: 650.0000 mg | ORAL_TABLET | ORAL | Status: DC | PRN

## 2020-06-10 MED ORDER — HYDROMORPHONE HCL 1 MG/ML IJ SOLN
1.0000 mg | Freq: Once | INTRAMUSCULAR | Status: AC
  Administered 2020-06-10: 1 mg via INTRAVENOUS
  Filled 2020-06-10: qty 1
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
## 2020-06-10 NOTE — ED Notes (Signed)
Lab to add on troponin  

## 2020-06-10 NOTE — ED Notes (Signed)
Pt on CT.

## 2020-06-11 ENCOUNTER — Inpatient Hospital Stay (HOSPITAL_COMMUNITY): Payer: Medicare HMO

## 2020-06-11 ENCOUNTER — Inpatient Hospital Stay (HOSPITAL_COMMUNITY): Payer: Medicare HMO | Admitting: Certified Registered Nurse Anesthetist

## 2020-06-11 ENCOUNTER — Encounter (HOSPITAL_COMMUNITY): Payer: Self-pay

## 2020-06-11 ENCOUNTER — Encounter (HOSPITAL_COMMUNITY): Admission: EM | Disposition: A | Discharge status: Skilled Nursing Facility | Payer: Self-pay | Source: Home / Self Care

## 2020-06-11 HISTORY — PX: INTRAMEDULLARY (IM) NAIL INTERTROCHANTERIC: SHX5875

## 2020-06-11 LAB — BASIC METABOLIC PANEL
Anion gap: 6 (ref 5–15)
BUN: 22 mg/dL (ref 8–23)
CO2: 22 mmol/L (ref 22–32)
Calcium: 9 mg/dL (ref 8.9–10.3)
Chloride: 108 mmol/L (ref 98–111)
Creatinine, Ser: 1.63 mg/dL — ABNORMAL HIGH (ref 0.61–1.24)
GFR, Estimated: 43 mL/min — ABNORMAL LOW (ref >60)
Glucose, Bld: 195 mg/dL — ABNORMAL HIGH (ref 70–99)
Potassium: 4.6 mmol/L (ref 3.5–5.1)
Sodium: 136 mmol/L (ref 135–145)

## 2020-06-11 LAB — POCT I-STAT, CHEM 8
BUN: 23 mg/dL (ref 8–23)
Calcium, Ion: 1.17 mmol/L (ref 1.15–1.40)
Chloride: 110 mmol/L (ref 98–111)
Creatinine, Ser: 1.5 mg/dL — ABNORMAL HIGH (ref 0.61–1.24)
Glucose, Bld: 136 mg/dL — ABNORMAL HIGH (ref 70–99)
HCT: 26 % — ABNORMAL LOW (ref 39.0–52.0)
Hemoglobin: 8.8 g/dL — ABNORMAL LOW (ref 13.0–17.0)
Potassium: 4.6 mmol/L (ref 3.5–5.1)
Sodium: 139 mmol/L (ref 135–145)
TCO2: 21 mmol/L — ABNORMAL LOW (ref 22–32)

## 2020-06-11 LAB — CBC
HCT: 30 % — ABNORMAL LOW (ref 39.0–52.0)
HCT: 31 % — ABNORMAL LOW (ref 39.0–52.0)
Hemoglobin: 10 g/dL — ABNORMAL LOW (ref 13.0–17.0)
Hemoglobin: 9.6 g/dL — ABNORMAL LOW (ref 13.0–17.0)
MCH: 30.6 pg (ref 26.0–34.0)
MCH: 30.7 pg (ref 26.0–34.0)
MCHC: 32 g/dL (ref 30.0–36.0)
MCHC: 32.3 g/dL (ref 30.0–36.0)
MCV: 95.1 fL (ref 80.0–100.0)
MCV: 95.5 fL (ref 80.0–100.0)
Platelets: 186 10*3/uL (ref 150–400)
Platelets: 189 10*3/uL (ref 150–400)
RBC: 3.14 MIL/uL — ABNORMAL LOW (ref 4.22–5.81)
RBC: 3.26 MIL/uL — ABNORMAL LOW (ref 4.22–5.81)
RDW: 14.5 % (ref 11.5–15.5)
RDW: 14.5 % (ref 11.5–15.5)
WBC: 8.9 10*3/uL (ref 4.0–10.5)
WBC: 9.2 10*3/uL (ref 4.0–10.5)
nRBC: 0 % (ref 0.0–0.2)
nRBC: 0 % (ref 0.0–0.2)

## 2020-06-11 LAB — GLUCOSE, CAPILLARY
Glucose-Capillary: 147 mg/dL — ABNORMAL HIGH (ref 70–99)
Glucose-Capillary: 139 mg/dL — ABNORMAL HIGH (ref 70–99)
Glucose-Capillary: 156 mg/dL — ABNORMAL HIGH (ref 70–99)
Glucose-Capillary: 180 mg/dL — ABNORMAL HIGH (ref 70–99)

## 2020-06-11 LAB — SURGICAL PCR SCREEN
MRSA, PCR: NEGATIVE
Staphylococcus aureus: NEGATIVE

## 2020-06-11 LAB — HEMOGLOBIN A1C
Hgb A1c MFr Bld: 6.2 % — ABNORMAL HIGH (ref 4.8–5.6)
Mean Plasma Glucose: 131.24 mg/dL

## 2020-06-11 LAB — CREATININE, SERUM
Creatinine, Ser: 1.63 mg/dL — ABNORMAL HIGH (ref 0.61–1.24)
GFR, Estimated: 43 mL/min — ABNORMAL LOW (ref >60)

## 2020-06-11 LAB — VITAMIN D 25 HYDROXY (VIT D DEFICIENCY, FRACTURES): Vit D, 25-Hydroxy: 14.23 ng/mL — ABNORMAL LOW (ref 30–100)

## 2020-06-11 LAB — CBG MONITORING, ED: Glucose-Capillary: 168 mg/dL — ABNORMAL HIGH (ref 70–99)

## 2020-06-11 SURGERY — FIXATION, FRACTURE, INTERTROCHANTERIC, WITH INTRAMEDULLARY ROD
Anesthesia: General | Laterality: Left

## 2020-06-11 MED ORDER — METOCLOPRAMIDE HCL 5 MG/ML IJ SOLN: 5.0000 mg | Freq: Three times a day (TID) | INTRAMUSCULAR | Status: DC | PRN

## 2020-06-11 MED ORDER — TRANEXAMIC ACID-NACL 1000-0.7 MG/100ML-% IV SOLN
1000.0000 mg | INTRAVENOUS | Status: AC
  Administered 2020-06-11: 1000 mg via INTRAVENOUS
  Filled 2020-06-11 (×3): qty 100

## 2020-06-11 MED ORDER — VANCOMYCIN HCL 1000 MG IV SOLR: INTRAVENOUS | Status: DC | PRN

## 2020-06-11 MED ORDER — LIDOCAINE 2% (20 MG/ML) 5 ML SYRINGE
INTRAMUSCULAR | Status: DC | PRN
  Administered 2020-06-11: 60 mg via INTRAVENOUS

## 2020-06-11 MED ORDER — METHOCARBAMOL 1000 MG/10ML IJ SOLN
500.0000 mg | Freq: Four times a day (QID) | INTRAVENOUS | Status: DC | PRN
  Filled 2020-06-11: qty 5

## 2020-06-11 MED ORDER — HYDROCHLOROTHIAZIDE 12.5 MG PO CAPS
12.5000 mg | ORAL_CAPSULE | Freq: Every day | ORAL | Status: DC
  Administered 2020-06-11 – 2020-06-19 (×9): 12.5 mg via ORAL
  Filled 2020-06-11 (×9): qty 1

## 2020-06-11 MED ORDER — DEXAMETHASONE SODIUM PHOSPHATE 10 MG/ML IJ SOLN
INTRAMUSCULAR | Status: AC
  Filled 2020-06-11: qty 3

## 2020-06-11 MED ORDER — METOCLOPRAMIDE HCL 5 MG PO TABS: 5.0000 mg | ORAL_TABLET | Freq: Three times a day (TID) | ORAL | Status: DC | PRN

## 2020-06-11 MED ORDER — ONDANSETRON HCL 4 MG PO TABS: 4.0000 mg | ORAL_TABLET | Freq: Four times a day (QID) | ORAL | Status: DC | PRN

## 2020-06-11 MED ORDER — PROPOFOL 10 MG/ML IV BOLUS
INTRAVENOUS | Status: AC
  Filled 2020-06-11: qty 20

## 2020-06-11 MED ORDER — METHOCARBAMOL 1000 MG/10ML IJ SOLN
500.0000 mg | Freq: Three times a day (TID) | INTRAVENOUS | Status: DC
  Administered 2020-06-11 – 2020-06-12 (×3): 500 mg via INTRAVENOUS
  Filled 2020-06-11 (×6): qty 5

## 2020-06-11 MED ORDER — CHLORHEXIDINE GLUCONATE 4 % EX LIQD: 60.0000 mL | Freq: Once | CUTANEOUS | Status: DC

## 2020-06-11 MED ORDER — OXYCODONE HCL 5 MG PO TABS: 5.0000 mg | ORAL_TABLET | ORAL | Status: DC | PRN

## 2020-06-11 MED ORDER — ACETAMINOPHEN 325 MG PO TABS: 325.0000 mg | ORAL_TABLET | Freq: Four times a day (QID) | ORAL | Status: DC | PRN

## 2020-06-11 MED ORDER — LACTATED RINGERS IV SOLN: INTRAVENOUS | Status: DC

## 2020-06-11 MED ORDER — ENOXAPARIN SODIUM 30 MG/0.3ML ~~LOC~~ SOLN
30.0000 mg | Freq: Two times a day (BID) | SUBCUTANEOUS | Status: DC
  Administered 2020-06-12 – 2020-06-24 (×23): 30 mg via SUBCUTANEOUS
  Filled 2020-06-11 (×24): qty 0.3

## 2020-06-11 MED ORDER — POVIDONE-IODINE 10 % EX SWAB: 2.0000 "application " | Freq: Once | CUTANEOUS | Status: DC

## 2020-06-11 MED ORDER — PHENYLEPHRINE HCL (PRESSORS) 10 MG/ML IV SOLN
INTRAVENOUS | Status: DC | PRN
  Administered 2020-06-11: 80 ug via INTRAVENOUS

## 2020-06-11 MED ORDER — ROCURONIUM BROMIDE 10 MG/ML (PF) SYRINGE
PREFILLED_SYRINGE | INTRAVENOUS | Status: DC | PRN
  Administered 2020-06-11: 50 mg via INTRAVENOUS

## 2020-06-11 MED ORDER — OXYCODONE HCL 5 MG/5ML PO SOLN: 5.0000 mg | Freq: Once | ORAL | Status: DC | PRN

## 2020-06-11 MED ORDER — CEFAZOLIN SODIUM-DEXTROSE 2-4 GM/100ML-% IV SOLN
2.0000 g | INTRAVENOUS | Status: AC
  Administered 2020-06-11: 2 g via INTRAVENOUS
  Filled 2020-06-11 (×2): qty 100

## 2020-06-11 MED ORDER — LABETALOL HCL 5 MG/ML IV SOLN
INTRAVENOUS | Status: AC
  Filled 2020-06-11: qty 4

## 2020-06-11 MED ORDER — ONDANSETRON HCL 4 MG/2ML IJ SOLN: 4.0000 mg | Freq: Once | INTRAMUSCULAR | Status: DC | PRN

## 2020-06-11 MED ORDER — VALSARTAN-HYDROCHLOROTHIAZIDE 160-12.5 MG PO TABS: 1.0000 | ORAL_TABLET | Freq: Every day | ORAL | Status: DC

## 2020-06-11 MED ORDER — FENTANYL CITRATE (PF) 250 MCG/5ML IJ SOLN
INTRAMUSCULAR | Status: DC | PRN
  Administered 2020-06-11 (×3): 50 ug via INTRAVENOUS

## 2020-06-11 MED ORDER — FENTANYL CITRATE (PF) 100 MCG/2ML IJ SOLN: 25.0000 ug | INTRAMUSCULAR | Status: DC | PRN

## 2020-06-11 MED ORDER — PHENYLEPHRINE 40 MCG/ML (10ML) SYRINGE FOR IV PUSH (FOR BLOOD PRESSURE SUPPORT)
PREFILLED_SYRINGE | INTRAVENOUS | Status: AC
  Filled 2020-06-11: qty 30

## 2020-06-11 MED ORDER — VANCOMYCIN HCL 1000 MG IV SOLR
INTRAVENOUS | Status: AC
  Filled 2020-06-11: qty 1000

## 2020-06-11 MED ORDER — PHENYLEPHRINE HCL-NACL 10-0.9 MG/250ML-% IV SOLN
INTRAVENOUS | Status: DC | PRN
  Administered 2020-06-11: 15 ug/min via INTRAVENOUS

## 2020-06-11 MED ORDER — DEXAMETHASONE SODIUM PHOSPHATE 10 MG/ML IJ SOLN
INTRAMUSCULAR | Status: DC | PRN
  Administered 2020-06-11: 5 mg via INTRAVENOUS

## 2020-06-11 MED ORDER — CEFAZOLIN SODIUM-DEXTROSE 2-4 GM/100ML-% IV SOLN
2.0000 g | Freq: Three times a day (TID) | INTRAVENOUS | Status: AC
  Administered 2020-06-11 – 2020-06-12 (×3): 2 g via INTRAVENOUS
  Filled 2020-06-11 (×3): qty 100

## 2020-06-11 MED ORDER — ACETAMINOPHEN 10 MG/ML IV SOLN
INTRAVENOUS | Status: DC | PRN
  Administered 2020-06-11: 1000 mg via INTRAVENOUS

## 2020-06-11 MED ORDER — ALBUMIN HUMAN 5 % IV SOLN: INTRAVENOUS | Status: DC | PRN

## 2020-06-11 MED ORDER — METHOCARBAMOL 500 MG PO TABS: 500.0000 mg | ORAL_TABLET | Freq: Four times a day (QID) | ORAL | Status: DC | PRN

## 2020-06-11 MED ORDER — SUGAMMADEX SODIUM 200 MG/2ML IV SOLN
INTRAVENOUS | Status: DC | PRN
  Administered 2020-06-11: 200 mg via INTRAVENOUS

## 2020-06-11 MED ORDER — IRBESARTAN 150 MG PO TABS
150.0000 mg | ORAL_TABLET | Freq: Every day | ORAL | Status: DC
  Administered 2020-06-11 – 2020-06-24 (×14): 150 mg via ORAL
  Filled 2020-06-11 (×14): qty 1

## 2020-06-11 MED ORDER — FENTANYL CITRATE (PF) 250 MCG/5ML IJ SOLN
INTRAMUSCULAR | Status: AC
  Filled 2020-06-11: qty 5

## 2020-06-11 MED ORDER — LABETALOL HCL 5 MG/ML IV SOLN
INTRAVENOUS | Status: DC | PRN
  Administered 2020-06-11: 5 mg via INTRAVENOUS

## 2020-06-11 MED ORDER — PROPOFOL 10 MG/ML IV BOLUS
INTRAVENOUS | Status: DC | PRN
  Administered 2020-06-11: 140 mg via INTRAVENOUS

## 2020-06-11 MED ORDER — ONDANSETRON HCL 4 MG/2ML IJ SOLN
INTRAMUSCULAR | Status: DC | PRN
  Administered 2020-06-11: 4 mg via INTRAVENOUS

## 2020-06-11 MED ORDER — MORPHINE SULFATE (PF) 2 MG/ML IV SOLN: 0.5000 mg | INTRAVENOUS | Status: DC | PRN

## 2020-06-11 MED ORDER — CEFAZOLIN SODIUM-DEXTROSE 2-4 GM/100ML-% IV SOLN
INTRAVENOUS | Status: AC
  Filled 2020-06-11: qty 100

## 2020-06-11 MED ORDER — CHLORHEXIDINE GLUCONATE 0.12 % MT SOLN
OROMUCOSAL | Status: AC
  Administered 2020-06-11: 15 mL via OROMUCOSAL
  Filled 2020-06-11: qty 15

## 2020-06-11 MED ORDER — 0.9 % SODIUM CHLORIDE (POUR BTL) OPTIME
TOPICAL | Status: DC | PRN
  Administered 2020-06-11: 1000 mL

## 2020-06-11 MED ORDER — HYDROCODONE-ACETAMINOPHEN 5-325 MG PO TABS: 1.0000 | ORAL_TABLET | ORAL | Status: DC | PRN

## 2020-06-11 MED ORDER — AMISULPRIDE (ANTIEMETIC) 5 MG/2ML IV SOLN: 10.0000 mg | Freq: Once | INTRAVENOUS | Status: DC | PRN

## 2020-06-11 MED ORDER — OXYCODONE HCL 5 MG PO TABS: 5.0000 mg | ORAL_TABLET | Freq: Once | ORAL | Status: DC | PRN

## 2020-06-11 MED ORDER — TRANEXAMIC ACID-NACL 1000-0.7 MG/100ML-% IV SOLN
1000.0000 mg | Freq: Once | INTRAVENOUS | Status: AC
  Administered 2020-06-11: 1000 mg via INTRAVENOUS
  Filled 2020-06-11: qty 100

## 2020-06-11 MED ORDER — DOCUSATE SODIUM 100 MG PO CAPS
100.0000 mg | ORAL_CAPSULE | Freq: Two times a day (BID) | ORAL | Status: DC
  Administered 2020-06-11 – 2020-06-13 (×5): 100 mg via ORAL
  Filled 2020-06-11 (×6): qty 1

## 2020-06-11 MED ORDER — CHLORHEXIDINE GLUCONATE 0.12 % MT SOLN
15.0000 mL | OROMUCOSAL | Status: AC
  Filled 2020-06-11: qty 15

## 2020-06-11 MED ORDER — ONDANSETRON HCL 4 MG/2ML IJ SOLN
INTRAMUSCULAR | Status: AC
  Filled 2020-06-11: qty 6

## 2020-06-11 MED ORDER — ONDANSETRON HCL 4 MG/2ML IJ SOLN: 4.0000 mg | Freq: Four times a day (QID) | INTRAMUSCULAR | Status: DC | PRN

## 2020-06-11 MED ORDER — LIDOCAINE 2% (20 MG/ML) 5 ML SYRINGE
INTRAMUSCULAR | Status: AC
  Filled 2020-06-11: qty 15

## 2020-06-11 MED ORDER — POLYETHYLENE GLYCOL 3350 17 G PO PACK: 17.0000 g | PACK | Freq: Every day | ORAL | Status: DC | PRN

## 2020-06-11 SURGICAL SUPPLY — 54 items
BIT DRILL INTERTAN LAG SCREW (BIT) ×1 IMPLANT
BIT DRILL SHORT 4.0 (BIT) IMPLANT
BRUSH SCRUB EZ PLAIN DRY (MISCELLANEOUS) ×4 IMPLANT
CHLORAPREP W/TINT 26 (MISCELLANEOUS) ×2 IMPLANT
COVER PERINEAL POST (MISCELLANEOUS) ×2 IMPLANT
COVER SURGICAL LIGHT HANDLE (MISCELLANEOUS) ×2 IMPLANT
COVER WAND RF STERILE (DRAPES) IMPLANT
DERMABOND ADHESIVE PROPEN (GAUZE/BANDAGES/DRESSINGS) ×1
DERMABOND ADVANCED (GAUZE/BANDAGES/DRESSINGS) ×1
DERMABOND ADVANCED .7 DNX12 (GAUZE/BANDAGES/DRESSINGS) ×1 IMPLANT
DERMABOND ADVANCED .7 DNX6 (GAUZE/BANDAGES/DRESSINGS) IMPLANT
DRAPE C-ARM 35X43 STRL (DRAPES) ×2 IMPLANT
DRAPE IMP U-DRAPE 54X76 (DRAPES) ×4 IMPLANT
DRAPE INCISE IOBAN 66X45 STRL (DRAPES) ×2 IMPLANT
DRAPE STERI IOBAN 125X83 (DRAPES) ×2 IMPLANT
DRAPE SURG 17X23 STRL (DRAPES) ×4 IMPLANT
DRAPE U-SHAPE 47X51 STRL (DRAPES) ×2 IMPLANT
DRESSING MEPILEX FLEX 4X4 (GAUZE/BANDAGES/DRESSINGS) IMPLANT
DRILL BIT SHORT 4.0 (BIT) ×4
DRSG MEPILEX BORDER 4X4 (GAUZE/BANDAGES/DRESSINGS) ×2 IMPLANT
DRSG MEPILEX BORDER 4X8 (GAUZE/BANDAGES/DRESSINGS) ×2 IMPLANT
DRSG MEPILEX FLEX 4X4 (GAUZE/BANDAGES/DRESSINGS) ×2
ELECT REM PT RETURN 9FT ADLT (ELECTROSURGICAL) ×2
ELECTRODE REM PT RTRN 9FT ADLT (ELECTROSURGICAL) ×1 IMPLANT
GLOVE BIO SURGEON STRL SZ 6.5 (GLOVE) ×6 IMPLANT
GLOVE BIO SURGEON STRL SZ7.5 (GLOVE) ×8 IMPLANT
GLOVE BIOGEL PI IND STRL 7.5 (GLOVE) ×1 IMPLANT
GLOVE BIOGEL PI INDICATOR 7.5 (GLOVE) ×1
GLOVE SURG UNDER POLY LF SZ6.5 (GLOVE) ×2 IMPLANT
GOWN STRL REUS W/ TWL LRG LVL3 (GOWN DISPOSABLE) ×1 IMPLANT
GOWN STRL REUS W/TWL LRG LVL3 (GOWN DISPOSABLE) ×2
GUIDE PIN 3.2X343 (PIN) ×2
GUIDE PIN 3.2X343MM (PIN) ×4
GUIDE ROD 3.0 (MISCELLANEOUS) ×2
KIT BASIN OR (CUSTOM PROCEDURE TRAY) ×2 IMPLANT
KIT TURNOVER KIT B (KITS) ×2 IMPLANT
MANIFOLD NEPTUNE II (INSTRUMENTS) ×2 IMPLANT
NAIL LOCK CANN 10X380 130D LT (Nail) ×1 IMPLANT
NS IRRIG 1000ML POUR BTL (IV SOLUTION) ×2 IMPLANT
PACK GENERAL/GYN (CUSTOM PROCEDURE TRAY) ×2 IMPLANT
PAD ARMBOARD 7.5X6 YLW CONV (MISCELLANEOUS) ×4 IMPLANT
PIN GUIDE 3.2X343MM (PIN) IMPLANT
ROD GUIDE 3.0 (MISCELLANEOUS) IMPLANT
SCREW LAG COMPR KIT 105/100 (Screw) ×1 IMPLANT
SCREW TRIGEN LOW PROF 5.0X45 (Screw) ×1 IMPLANT
SCREW TRIGEN LOW PROF 5.0X47.5 (Screw) ×1 IMPLANT
SUT MNCRL AB 3-0 PS2 18 (SUTURE) ×2 IMPLANT
SUT VIC AB 0 CT1 27 (SUTURE)
SUT VIC AB 0 CT1 27XBRD ANBCTR (SUTURE) IMPLANT
SUT VIC AB 2-0 CT1 27 (SUTURE) ×4
SUT VIC AB 2-0 CT1 TAPERPNT 27 (SUTURE) ×2 IMPLANT
SUT VIC AB CT1 27XBRD ANBCTRL (SUTURE)
TOWEL GREEN STERILE (TOWEL DISPOSABLE) ×4 IMPLANT
WATER STERILE IRR 1000ML POUR (IV SOLUTION) ×2 IMPLANT
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
## 2020-06-11 NOTE — Progress Notes (Signed)
Orthopedic Tech Progress Note Patient Details:  Frank Hernandez January 07, 1941 [id number] Applied Overhead Frame with Trapeze       Post Interventions Patient Tolerated: Well Instructions Provided: Adjustment of device   [name] 06/11/2020, 9:55 PM
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
## 2020-06-11 NOTE — Anesthesia Procedure Notes (Signed)
Procedure Name: Intubation Date/Time: 06/11/2020 3:14 PM Performed by: Clearnce Sorrel, CRNA Pre-anesthesia Checklist: Patient identified, Emergency Drugs available, Suction available, Patient being monitored and Timeout performed Patient Re-evaluated:Patient Re-evaluated prior to induction Oxygen Delivery Method: Circle system utilized Preoxygenation: Pre-oxygenation with 100% oxygen Induction Type: IV induction Ventilation: Mask ventilation without difficulty and Oral airway inserted - appropriate to patient size Laryngoscope Size: Mac and 4 Grade View: Grade I Tube type: Oral Tube size: 7.5 mm Number of attempts: 1 Airway Equipment and Method: Stylet Placement Confirmation: ETT inserted through vocal cords under direct vision,  positive ETCO2 and breath sounds checked- equal and bilateral Secured at: 23 cm Tube secured with: Tape Dental Injury: Teeth and Oropharynx as per pre-operative assessment

## 2020-06-11 NOTE — Transfer of Care (Signed)
Immediate Anesthesia Transfer of Care Note  Patient: Frank Hernandez  Procedure(s) Performed: INTRAMEDULLARY (IM) NAIL INTERTROCHANTRIC (Left )  Patient Location: PACU  Anesthesia Type:General  Level of Consciousness: drowsy  Airway & Oxygen Therapy: Patient Spontanous Breathing  Post-op Assessment: Report given to RN and Post -op Vital signs reviewed and stable  Post vital signs: Reviewed and stable  Last Vitals:  Vitals Value Taken Time  BP 154/73 06/11/20 1652  Temp    Pulse 63 06/11/20 1654  Resp 14 06/11/20 1654  SpO2 98 % 06/11/20 1654  Vitals shown include unvalidated device data.  Last Pain:  Vitals:   06/11/20 0709  TempSrc:   PainSc: Asleep         Complications: No complications documented.

## 2020-06-11 NOTE — Progress Notes (Signed)
PT Cancellation Note  Patient Details Name: Frank Hernandez MRN: [id number] DOB: [birth year]   Cancelled Treatment:    Reason Eval/Treat Not Completed: Medical issues which prohibited therapy.  Awaiting surgery for L femoral greater and lesser trochanteric fractures, will hold PT until further surgical disposition is done.   Ramond Dial 06/11/2020, 8:53 AM   Mee Hives, PT MS Acute Rehab Dept. Number: [place]

## 2020-06-11 NOTE — Interval H&P Note (Signed)
History and Physical Interval Note:  06/11/2020 2:13 PM  Frank Hernandez  has presented today for surgery, with the diagnosis of Left hip fracture.  The various methods of treatment have been discussed with the patient and family. After consideration of risks, benefits and other options for treatment, the patient has consented to  Procedure(s): INTRAMEDULLARY (IM) NAIL INTERTROCHANTRIC (Left) as a surgical intervention.  The patient's history has been reviewed, patient examined, no change in status, stable for surgery.  I have reviewed the patient's chart and labs.  Questions were answered to the patient's satisfaction.     Lennette Bihari P Mija Effertz
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
## 2020-06-11 NOTE — Anesthesia Postprocedure Evaluation (Signed)
Anesthesia Post Note  Patient: Frank Hernandez  Procedure(s) Performed: INTRAMEDULLARY (IM) NAIL INTERTROCHANTRIC (Left )     Patient location during evaluation: PACU Anesthesia Type: General Level of consciousness: sedated and patient cooperative Pain management: pain level controlled Vital Signs Assessment: post-procedure vital signs reviewed and stable Respiratory status: spontaneous breathing Cardiovascular status: stable Anesthetic complications: no   No complications documented.  Last Vitals:  Vitals:   06/11/20 1808 06/11/20 1835  BP: (!) 156/75 (!) 158/80  Pulse: 64 64  Resp: 16 17  Temp: 36.6 C 36.8 C  SpO2: 95% 97%    Last Pain:  Vitals:   06/11/20 1835  TempSrc: Oral  PainSc:                  Nolon Nations
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
## 2020-06-11 NOTE — ED Notes (Signed)
Attempted report x1. 
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
## 2020-06-12 ENCOUNTER — Encounter (HOSPITAL_COMMUNITY): Payer: Self-pay | Admitting: Student

## 2020-06-12 LAB — GLUCOSE, CAPILLARY
Glucose-Capillary: 112 mg/dL — ABNORMAL HIGH (ref 70–99)
Glucose-Capillary: 119 mg/dL — ABNORMAL HIGH (ref 70–99)
Glucose-Capillary: 131 mg/dL — ABNORMAL HIGH (ref 70–99)
Glucose-Capillary: 135 mg/dL — ABNORMAL HIGH (ref 70–99)
Glucose-Capillary: 150 mg/dL — ABNORMAL HIGH (ref 70–99)
Glucose-Capillary: 152 mg/dL — ABNORMAL HIGH (ref 70–99)
Glucose-Capillary: 95 mg/dL (ref 70–99)

## 2020-06-12 LAB — CBC
HCT: 23.6 % — ABNORMAL LOW (ref 39.0–52.0)
Hemoglobin: 7.7 g/dL — ABNORMAL LOW (ref 13.0–17.0)
MCH: 30.6 pg (ref 26.0–34.0)
MCHC: 32.6 g/dL (ref 30.0–36.0)
MCV: 93.7 fL (ref 80.0–100.0)
Platelets: 131 10*3/uL — ABNORMAL LOW (ref 150–400)
RBC: 2.52 MIL/uL — ABNORMAL LOW (ref 4.22–5.81)
RDW: 14.4 % (ref 11.5–15.5)
WBC: 7.9 10*3/uL (ref 4.0–10.5)
nRBC: 0 % (ref 0.0–0.2)

## 2020-06-12 LAB — BASIC METABOLIC PANEL
Anion gap: 8 (ref 5–15)
BUN: 23 mg/dL (ref 8–23)
CO2: 21 mmol/L — ABNORMAL LOW (ref 22–32)
Calcium: 8.9 mg/dL (ref 8.9–10.3)
Chloride: 106 mmol/L (ref 98–111)
Creatinine, Ser: 1.76 mg/dL — ABNORMAL HIGH (ref 0.61–1.24)
GFR, Estimated: 39 mL/min — ABNORMAL LOW (ref >60)
Glucose, Bld: 142 mg/dL — ABNORMAL HIGH (ref 70–99)
Potassium: 4.3 mmol/L (ref 3.5–5.1)
Sodium: 135 mmol/L (ref 135–145)

## 2020-06-12 MED ORDER — DIPHENHYDRAMINE HCL 25 MG PO CAPS
25.0000 mg | ORAL_CAPSULE | Freq: Every evening | ORAL | Status: DC | PRN
  Administered 2020-06-21: 25 mg via ORAL
  Filled 2020-06-12: qty 1

## 2020-06-12 MED ORDER — MORPHINE SULFATE (PF) 2 MG/ML IV SOLN
1.0000 mg | INTRAVENOUS | Status: DC | PRN
  Administered 2020-06-13: 1 mg via INTRAVENOUS
  Filled 2020-06-12: qty 1

## 2020-06-12 MED ORDER — OXYCODONE HCL 5 MG PO TABS
5.0000 mg | ORAL_TABLET | ORAL | Status: DC | PRN
  Administered 2020-06-12: 5 mg via ORAL
  Administered 2020-06-12 – 2020-06-14 (×2): 10 mg via ORAL
  Filled 2020-06-12: qty 2
  Filled 2020-06-12: qty 1
  Filled 2020-06-12: qty 2

## 2020-06-12 MED ORDER — METHOCARBAMOL 750 MG PO TABS
750.0000 mg | ORAL_TABLET | Freq: Three times a day (TID) | ORAL | Status: DC
  Administered 2020-06-12 – 2020-06-21 (×27): 750 mg via ORAL
  Filled 2020-06-12 (×27): qty 1

## 2020-06-12 MED ORDER — ACETAMINOPHEN 325 MG PO TABS
650.0000 mg | ORAL_TABLET | Freq: Four times a day (QID) | ORAL | Status: DC
  Administered 2020-06-12 – 2020-06-21 (×35): 650 mg via ORAL
  Filled 2020-06-12 (×34): qty 2

## 2020-06-12 MED ORDER — METHOCARBAMOL 500 MG PO TABS
500.0000 mg | ORAL_TABLET | Freq: Three times a day (TID) | ORAL | Status: DC
  Filled 2020-06-12: qty 1

## 2020-06-12 NOTE — Addendum Note (Signed)
Addendum  created 06/12/20 1613 by Lidia Collum, MD   Attestation recorded in [place] filed

## 2020-06-12 NOTE — Progress Notes (Signed)
Inpatient Rehab Admissions Coordinator Note:   Per therapy recommendations, pt was screened for CIR candidacy by Shann Medal, PT, DPT.  Also discussed in trauma rounds.  Unfortunately, [employer] is unlikely to approve this patient for CIR, with current diagnosis.  Would not recommend a consult at this time.  Please contact me with questions.   Shann Medal, PT, DPT [phone] 06/12/20 3:48 PM
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
## 2020-06-13 LAB — CBC
HCT: 21.8 % — ABNORMAL LOW (ref 39.0–52.0)
Hemoglobin: 7.2 g/dL — ABNORMAL LOW (ref 13.0–17.0)
MCH: 31.2 pg (ref 26.0–34.0)
MCHC: 33 g/dL (ref 30.0–36.0)
MCV: 94.4 fL (ref 80.0–100.0)
Platelets: 125 10*3/uL — ABNORMAL LOW (ref 150–400)
RBC: 2.31 MIL/uL — ABNORMAL LOW (ref 4.22–5.81)
RDW: 14.4 % (ref 11.5–15.5)
WBC: 6.6 10*3/uL (ref 4.0–10.5)
nRBC: 0.3 % — ABNORMAL HIGH (ref 0.0–0.2)

## 2020-06-13 LAB — BASIC METABOLIC PANEL
Anion gap: 4 — ABNORMAL LOW (ref 5–15)
BUN: 24 mg/dL — ABNORMAL HIGH (ref 8–23)
CO2: 21 mmol/L — ABNORMAL LOW (ref 22–32)
Calcium: 8.6 mg/dL — ABNORMAL LOW (ref 8.9–10.3)
Chloride: 107 mmol/L (ref 98–111)
Creatinine, Ser: 1.75 mg/dL — ABNORMAL HIGH (ref 0.61–1.24)
GFR, Estimated: 39 mL/min — ABNORMAL LOW (ref >60)
Glucose, Bld: 121 mg/dL — ABNORMAL HIGH (ref 70–99)
Potassium: 4 mmol/L (ref 3.5–5.1)
Sodium: 132 mmol/L — ABNORMAL LOW (ref 135–145)

## 2020-06-13 LAB — GLUCOSE, CAPILLARY
Glucose-Capillary: 104 mg/dL — ABNORMAL HIGH (ref 70–99)
Glucose-Capillary: 108 mg/dL — ABNORMAL HIGH (ref 70–99)
Glucose-Capillary: 114 mg/dL — ABNORMAL HIGH (ref 70–99)
Glucose-Capillary: 114 mg/dL — ABNORMAL HIGH (ref 70–99)
Glucose-Capillary: 117 mg/dL — ABNORMAL HIGH (ref 70–99)
Glucose-Capillary: 125 mg/dL — ABNORMAL HIGH (ref 70–99)
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
## 2020-06-14 LAB — BASIC METABOLIC PANEL
Anion gap: 5 (ref 5–15)
BUN: 24 mg/dL — ABNORMAL HIGH (ref 8–23)
CO2: 22 mmol/L (ref 22–32)
Calcium: 8.8 mg/dL — ABNORMAL LOW (ref 8.9–10.3)
Chloride: 106 mmol/L (ref 98–111)
Creatinine, Ser: 1.58 mg/dL — ABNORMAL HIGH (ref 0.61–1.24)
GFR, Estimated: 44 mL/min — ABNORMAL LOW (ref >60)
Glucose, Bld: 105 mg/dL — ABNORMAL HIGH (ref 70–99)
Potassium: 3.9 mmol/L (ref 3.5–5.1)
Sodium: 133 mmol/L — ABNORMAL LOW (ref 135–145)

## 2020-06-14 LAB — GLUCOSE, CAPILLARY
Glucose-Capillary: 103 mg/dL — ABNORMAL HIGH (ref 70–99)
Glucose-Capillary: 104 mg/dL — ABNORMAL HIGH (ref 70–99)
Glucose-Capillary: 115 mg/dL — ABNORMAL HIGH (ref 70–99)
Glucose-Capillary: 134 mg/dL — ABNORMAL HIGH (ref 70–99)
Glucose-Capillary: 83 mg/dL (ref 70–99)
Glucose-Capillary: 90 mg/dL (ref 70–99)

## 2020-06-14 MED ORDER — BISMUTH SUBSALICYLATE 262 MG/15ML PO SUSP
30.0000 mL | ORAL | Status: DC | PRN
  Administered 2020-06-14 – 2020-06-15 (×3): 30 mL via ORAL
  Filled 2020-06-14: qty 236
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
## 2020-06-15 LAB — CBC
HCT: 21 % — ABNORMAL LOW (ref 39.0–52.0)
Hemoglobin: 6.9 g/dL — CL (ref 13.0–17.0)
MCH: 30.5 pg (ref 26.0–34.0)
MCHC: 32.9 g/dL (ref 30.0–36.0)
MCV: 92.9 fL (ref 80.0–100.0)
Platelets: 157 10*3/uL (ref 150–400)
RBC: 2.26 MIL/uL — ABNORMAL LOW (ref 4.22–5.81)
RDW: 13.9 % (ref 11.5–15.5)
WBC: 5.6 10*3/uL (ref 4.0–10.5)
nRBC: 0.9 % — ABNORMAL HIGH (ref 0.0–0.2)

## 2020-06-15 LAB — BASIC METABOLIC PANEL
Anion gap: 7 (ref 5–15)
BUN: 29 mg/dL — ABNORMAL HIGH (ref 8–23)
CO2: 21 mmol/L — ABNORMAL LOW (ref 22–32)
Calcium: 8.5 mg/dL — ABNORMAL LOW (ref 8.9–10.3)
Chloride: 106 mmol/L (ref 98–111)
Creatinine, Ser: 1.65 mg/dL — ABNORMAL HIGH (ref 0.61–1.24)
GFR, Estimated: 42 mL/min — ABNORMAL LOW (ref >60)
Glucose, Bld: 106 mg/dL — ABNORMAL HIGH (ref 70–99)
Potassium: 2.9 mmol/L — ABNORMAL LOW (ref 3.5–5.1)
Sodium: 134 mmol/L — ABNORMAL LOW (ref 135–145)

## 2020-06-15 LAB — ABO/RH: ABO/RH(D): A POS

## 2020-06-15 LAB — GLUCOSE, CAPILLARY
Glucose-Capillary: 114 mg/dL — ABNORMAL HIGH (ref 70–99)
Glucose-Capillary: 116 mg/dL — ABNORMAL HIGH (ref 70–99)
Glucose-Capillary: 141 mg/dL — ABNORMAL HIGH (ref 70–99)
Glucose-Capillary: 89 mg/dL (ref 70–99)
Glucose-Capillary: 96 mg/dL (ref 70–99)
Glucose-Capillary: 96 mg/dL (ref 70–99)

## 2020-06-15 LAB — HEMOGLOBIN AND HEMATOCRIT, BLOOD
HCT: 26.2 % — ABNORMAL LOW (ref 39.0–52.0)
Hemoglobin: 8.5 g/dL — ABNORMAL LOW (ref 13.0–17.0)

## 2020-06-15 LAB — PREPARE RBC (CROSSMATCH)

## 2020-06-15 MED ORDER — FUROSEMIDE 10 MG/ML IJ SOLN
20.0000 mg | Freq: Once | INTRAMUSCULAR | Status: AC
  Administered 2020-06-15: 20 mg via INTRAVENOUS
  Filled 2020-06-15: qty 2

## 2020-06-15 MED ORDER — LOPERAMIDE HCL 2 MG PO CAPS
2.0000 mg | ORAL_CAPSULE | Freq: Four times a day (QID) | ORAL | Status: DC | PRN
  Administered 2020-06-15: 2 mg via ORAL
  Filled 2020-06-15: qty 1

## 2020-06-15 MED ORDER — PSYLLIUM 95 % PO PACK
1.0000 | PACK | Freq: Two times a day (BID) | ORAL | Status: DC
  Administered 2020-06-16 – 2020-06-19 (×6): 1 via ORAL
  Filled 2020-06-15 (×7): qty 1

## 2020-06-15 MED ORDER — SODIUM CHLORIDE 0.9% IV SOLUTION: Freq: Once | INTRAVENOUS | Status: AC
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
## 2020-06-15 NOTE — Progress Notes (Addendum)
Lab called for critical value of HGB 6.9. Georganna Skeans, MD on call made aware. Called back and received a verbal order to Type & screen.  Patient is alert and verbally responsive. VS stable BP 125/62 PR64 RR15 T98.1 100%RA. No acute distress noted. No DOB/SOB noted.   Will  continue to monitor patient.

## 2020-06-15 NOTE — TOC Progression Note (Signed)
Transition of Care [facility]) - Progression Note    Patient Details  Name: Frank Hernandez MRN: [id number] Date of Birth: 12/14/1940  Transition of Care [facility]) CM/SW [place], [place] Phone Number: 06/15/2020, 11:25 AM  Clinical Narrative:    CSW received consult for possible SNF placement at time of discharge. CSW spoke with patient's wife. Patient's wife reported that she is hopeful for CIR but understands a back up plan needs to be in place and she is currently unable to care for patient at their home given patient's current physical needs and fall risk. Patient expressed understanding of PT recommendation and is agreeable to SNF placement at time of discharge if patient is not able to go to CIR.  CSW discussed insurance authorization process and provided Medicare.gov website for SNF ratings list. Patient has received the COVID vaccines and booster. Patient's wife expressed being hopeful for rehab and to feel better soon. No further questions reported at this time.    Expected Discharge Plan: IP Rehab Facility Barriers to Discharge: Continued Medical Work up  Expected Discharge Plan and Services Expected Discharge Plan: [place]   Discharge Planning Services: CM Consult   Living arrangements for the past 2 months: Single Family Home                                       Social Determinants of Health (SDOH) Interventions    Readmission Risk Interventions No flowsheet data found.

## 2020-06-16 ENCOUNTER — Inpatient Hospital Stay (HOSPITAL_COMMUNITY): Payer: Medicare HMO

## 2020-06-16 LAB — CBC
HCT: 24.2 % — ABNORMAL LOW (ref 39.0–52.0)
Hemoglobin: 8.1 g/dL — ABNORMAL LOW (ref 13.0–17.0)
MCH: 29.6 pg (ref 26.0–34.0)
MCHC: 33.5 g/dL (ref 30.0–36.0)
MCV: 88.3 fL (ref 80.0–100.0)
Platelets: 178 10*3/uL (ref 150–400)
RBC: 2.74 MIL/uL — ABNORMAL LOW (ref 4.22–5.81)
RDW: 15.8 % — ABNORMAL HIGH (ref 11.5–15.5)
WBC: 4.5 10*3/uL (ref 4.0–10.5)
nRBC: 0.4 % — ABNORMAL HIGH (ref 0.0–0.2)

## 2020-06-16 LAB — TYPE AND SCREEN
ABO/RH(D): A POS
Antibody Screen: NEGATIVE
Unit division: 0

## 2020-06-16 LAB — BPAM RBC
Blood Product Expiration Date: 202204232359
ISSUE DATE / TIME: 202204171017
Unit Type and Rh: 6200

## 2020-06-16 LAB — GLUCOSE, CAPILLARY
Glucose-Capillary: 100 mg/dL — ABNORMAL HIGH (ref 70–99)
Glucose-Capillary: 100 mg/dL — ABNORMAL HIGH (ref 70–99)
Glucose-Capillary: 101 mg/dL — ABNORMAL HIGH (ref 70–99)
Glucose-Capillary: 104 mg/dL — ABNORMAL HIGH (ref 70–99)
Glucose-Capillary: 111 mg/dL — ABNORMAL HIGH (ref 70–99)
Glucose-Capillary: 120 mg/dL — ABNORMAL HIGH (ref 70–99)

## 2020-06-16 MED ORDER — INSULIN ASPART 100 UNIT/ML ~~LOC~~ SOLN
0.0000 [IU] | Freq: Three times a day (TID) | SUBCUTANEOUS | Status: DC
  Administered 2020-06-17 (×2): 2 [IU] via SUBCUTANEOUS
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
## 2020-06-16 NOTE — Progress Notes (Addendum)
Patient noted to have black watery stool. Currently HGB 8.1, paged Annye English, MD on call. Called back and verbal order to hold Lovenox for now. Patient is alert and verbally responsive. Will continue to monitor patient.  Repeat CBC at 0600 per verbal order by Oliver Barre

## 2020-06-17 LAB — GASTROINTESTINAL PANEL BY PCR, STOOL (REPLACES STOOL CULTURE)

## 2020-06-17 LAB — CBC
HCT: 24.4 % — ABNORMAL LOW (ref 39.0–52.0)
Hemoglobin: 8.2 g/dL — ABNORMAL LOW (ref 13.0–17.0)
MCH: 29.7 pg (ref 26.0–34.0)
MCHC: 33.6 g/dL (ref 30.0–36.0)
MCV: 88.4 fL (ref 80.0–100.0)
Platelets: 203 10*3/uL (ref 150–400)
RBC: 2.76 MIL/uL — ABNORMAL LOW (ref 4.22–5.81)
RDW: 15.5 % (ref 11.5–15.5)
WBC: 4.7 10*3/uL (ref 4.0–10.5)
nRBC: 0 % (ref 0.0–0.2)

## 2020-06-17 LAB — BASIC METABOLIC PANEL
Anion gap: 7 (ref 5–15)
BUN: 27 mg/dL — ABNORMAL HIGH (ref 8–23)
CO2: 23 mmol/L (ref 22–32)
Calcium: 8.8 mg/dL — ABNORMAL LOW (ref 8.9–10.3)
Chloride: 105 mmol/L (ref 98–111)
Creatinine, Ser: 1.52 mg/dL — ABNORMAL HIGH (ref 0.61–1.24)
GFR, Estimated: 46 mL/min — ABNORMAL LOW (ref >60)
Glucose, Bld: 103 mg/dL — ABNORMAL HIGH (ref 70–99)
Potassium: 2.5 mmol/L — CL (ref 3.5–5.1)
Sodium: 135 mmol/L (ref 135–145)

## 2020-06-17 LAB — GLUCOSE, CAPILLARY
Glucose-Capillary: 99 mg/dL (ref 70–99)
Glucose-Capillary: 148 mg/dL — ABNORMAL HIGH (ref 70–99)
Glucose-Capillary: 118 mg/dL — ABNORMAL HIGH (ref 70–99)
Glucose-Capillary: 141 mg/dL — ABNORMAL HIGH (ref 70–99)

## 2020-06-17 LAB — CREATININE, SERUM
Creatinine, Ser: 1.52 mg/dL — ABNORMAL HIGH (ref 0.61–1.24)
GFR, Estimated: 46 mL/min — ABNORMAL LOW (ref >60)

## 2020-06-17 MED ORDER — POTASSIUM CHLORIDE 20 MEQ PO PACK
20.0000 meq | PACK | Freq: Two times a day (BID) | ORAL | Status: AC
  Administered 2020-06-17 (×2): 20 meq via ORAL
  Filled 2020-06-17 (×2): qty 1

## 2020-06-17 MED ORDER — POTASSIUM CHLORIDE 10 MEQ/100ML IV SOLN
10.0000 meq | INTRAVENOUS | Status: AC
  Administered 2020-06-17 (×5): 10 meq via INTRAVENOUS
  Filled 2020-06-17 (×5): qty 100
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
## 2020-06-17 NOTE — Progress Notes (Signed)
Patient still having loose BMs. recent Potassium noted 4/17 (2.9 ). Paged Oliver Barre on call. MD called back with no new orders.  Will continue to monitor patient.

## 2020-06-17 NOTE — Progress Notes (Signed)
Inpatient Rehab Admissions Coordinator:   Met with patient and wife  at bedside to discuss potential CIR admission. Pt.'s wife stated interest, is able to provide 24/7 support and min assist. . Will pursue for potential admit next week, pending bed availability and insurance auth.   Clemens Catholic, [place], [place] Admissions Coordinator  [phone] ([place]) [phone] (office)

## 2020-06-17 NOTE — Progress Notes (Signed)
Date and time results received: 06/17/20  (use smartphrase ".now" to insert current time)  Test: Potassium level Critical Value: 2.5  Name of Provider Notified: [facility]  Orders Received? Awaiting orders: potassium 2.5
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
## 2020-06-18 LAB — BASIC METABOLIC PANEL
Anion gap: 7 (ref 5–15)
Anion gap: 8 (ref 5–15)
BUN: 26 mg/dL — ABNORMAL HIGH (ref 8–23)
BUN: 24 mg/dL — ABNORMAL HIGH (ref 8–23)
CO2: 22 mmol/L (ref 22–32)
CO2: 21 mmol/L — ABNORMAL LOW (ref 22–32)
Calcium: 8.9 mg/dL (ref 8.9–10.3)
Calcium: 9.2 mg/dL (ref 8.9–10.3)
Chloride: 105 mmol/L (ref 98–111)
Chloride: 108 mmol/L (ref 98–111)
Creatinine, Ser: 1.45 mg/dL — ABNORMAL HIGH (ref 0.61–1.24)
Creatinine, Ser: 1.38 mg/dL — ABNORMAL HIGH (ref 0.61–1.24)
GFR, Estimated: 49 mL/min — ABNORMAL LOW (ref >60)
GFR, Estimated: 52 mL/min — ABNORMAL LOW (ref >60)
Glucose, Bld: 83 mg/dL (ref 70–99)
Glucose, Bld: 112 mg/dL — ABNORMAL HIGH (ref 70–99)
Potassium: 2.4 mmol/L — CL (ref 3.5–5.1)
Potassium: 2.8 mmol/L — ABNORMAL LOW (ref 3.5–5.1)
Sodium: 134 mmol/L — ABNORMAL LOW (ref 135–145)
Sodium: 137 mmol/L (ref 135–145)

## 2020-06-18 LAB — GLUCOSE, CAPILLARY
Glucose-Capillary: 96 mg/dL (ref 70–99)
Glucose-Capillary: 133 mg/dL — ABNORMAL HIGH (ref 70–99)
Glucose-Capillary: 118 mg/dL — ABNORMAL HIGH (ref 70–99)
Glucose-Capillary: 114 mg/dL — ABNORMAL HIGH (ref 70–99)

## 2020-06-18 LAB — MAGNESIUM: Magnesium: 2.2 mg/dL (ref 1.7–2.4)

## 2020-06-18 MED ORDER — POTASSIUM CHLORIDE 20 MEQ PO PACK
40.0000 meq | PACK | Freq: Two times a day (BID) | ORAL | Status: AC
  Administered 2020-06-18 (×2): 40 meq via ORAL
  Filled 2020-06-18 (×2): qty 2

## 2020-06-18 MED ORDER — POTASSIUM CHLORIDE 10 MEQ/100ML IV SOLN
10.0000 meq | INTRAVENOUS | Status: AC
  Administered 2020-06-18 (×6): 10 meq via INTRAVENOUS
  Filled 2020-06-18 (×4): qty 100
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
## 2020-06-18 NOTE — Care Management Important Message (Signed)
Important Message  Patient Details  Name: Frank Hernandez MRN: [id number] Date of Birth: [birth year]   Medicare Important Message Given:  Yes     [name] [name] 06/18/2020, 3:11 PM

## 2020-06-18 NOTE — Progress Notes (Signed)
Abdominal binder placed on pt. PT is going to be working with him at 2.

## 2020-06-18 NOTE — Progress Notes (Signed)
Inpatient Rehab Admissions Coordinator:   I do not have a bed on CIR for this Pt and have not yet received insurance authorization. I will follow for potential admit pending bed availability and insurance auth. Pt. And wife updated.   Clemens Catholic, [place], [place] Admissions Coordinator  [phone] ([place]) [phone] (office)

## 2020-06-18 NOTE — Progress Notes (Signed)
Dr. Kae Heller notified of critical K of 2.4

## 2020-06-18 NOTE — Plan of Care (Signed)
  Problem: Education: Goal: Knowledge of General Education information will improve Description: Including pain rating scale, medication(s)/side effects and non-pharmacologic comfort measures Outcome: Progressing   Problem: Health Behavior/Discharge Planning: Goal: Ability to manage health-related needs will improve Outcome: Progressing   Problem: Health Behavior/Discharge Planning: Goal: Ability to manage health-related needs will improve Outcome: Progressing   Problem: Clinical Measurements: Goal: Ability to maintain clinical measurements within normal limits will improve Outcome: Progressing Goal: Will remain free from infection Outcome: Progressing Goal: Diagnostic test results will improve Outcome: Progressing Goal: Respiratory complications will improve Outcome: Progressing Goal: Cardiovascular complication will be avoided Outcome: Progressing   Problem: Education: Goal: Knowledge of General Education information will improve Description: Including pain rating scale, medication(s)/side effects and non-pharmacologic comfort measures Outcome: Progressing
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
## 2020-06-19 DIAGNOSIS — E876 Hypokalemia: Secondary | ICD-10-CM

## 2020-06-19 LAB — BASIC METABOLIC PANEL
Anion gap: 8 (ref 5–15)
Anion gap: 8 (ref 5–15)
BUN: 21 mg/dL (ref 8–23)
BUN: 22 mg/dL (ref 8–23)
CO2: 22 mmol/L (ref 22–32)
CO2: 19 mmol/L — ABNORMAL LOW (ref 22–32)
Calcium: 9.3 mg/dL (ref 8.9–10.3)
Calcium: 9.1 mg/dL (ref 8.9–10.3)
Chloride: 108 mmol/L (ref 98–111)
Chloride: 107 mmol/L (ref 98–111)
Creatinine, Ser: 1.39 mg/dL — ABNORMAL HIGH (ref 0.61–1.24)
Creatinine, Ser: 1.37 mg/dL — ABNORMAL HIGH (ref 0.61–1.24)
GFR, Estimated: 52 mL/min — ABNORMAL LOW (ref >60)
GFR, Estimated: 52 mL/min — ABNORMAL LOW (ref >60)
Glucose, Bld: 105 mg/dL — ABNORMAL HIGH (ref 70–99)
Glucose, Bld: 130 mg/dL — ABNORMAL HIGH (ref 70–99)
Potassium: 2.6 mmol/L — CL (ref 3.5–5.1)
Potassium: 3.6 mmol/L (ref 3.5–5.1)
Sodium: 138 mmol/L (ref 135–145)
Sodium: 134 mmol/L — ABNORMAL LOW (ref 135–145)

## 2020-06-19 LAB — PHOSPHORUS: Phosphorus: 1.6 mg/dL — ABNORMAL LOW (ref 2.5–4.6)

## 2020-06-19 LAB — IRON AND TIBC
Iron: 33 ug/dL — ABNORMAL LOW (ref 45–182)
Saturation Ratios: 11 % — ABNORMAL LOW (ref 17.9–39.5)
TIBC: 291 ug/dL (ref 250–450)
UIBC: 258 ug/dL

## 2020-06-19 LAB — GLUCOSE, CAPILLARY
Glucose-Capillary: 94 mg/dL (ref 70–99)
Glucose-Capillary: 143 mg/dL — ABNORMAL HIGH (ref 70–99)
Glucose-Capillary: 137 mg/dL — ABNORMAL HIGH (ref 70–99)
Glucose-Capillary: 110 mg/dL — ABNORMAL HIGH (ref 70–99)

## 2020-06-19 LAB — MAGNESIUM: Magnesium: 2 mg/dL (ref 1.7–2.4)

## 2020-06-19 MED ORDER — POLYETHYLENE GLYCOL 3350 17 G PO PACK: 17.0000 g | PACK | Freq: Every day | ORAL | Status: DC | PRN

## 2020-06-19 MED ORDER — AMLODIPINE BESYLATE 2.5 MG PO TABS
2.5000 mg | ORAL_TABLET | Freq: Every day | ORAL | Status: DC
  Administered 2020-06-19: 2.5 mg via ORAL
  Filled 2020-06-19 (×2): qty 1

## 2020-06-19 MED ORDER — POTASSIUM CHLORIDE 10 MEQ/100ML IV SOLN
10.0000 meq | Freq: Once | INTRAVENOUS | Status: AC
  Administered 2020-06-19: 10 meq via INTRAVENOUS
  Filled 2020-06-19 (×2): qty 100

## 2020-06-19 MED ORDER — POTASSIUM CHLORIDE 10 MEQ/100ML IV SOLN
10.0000 meq | INTRAVENOUS | Status: AC
  Administered 2020-06-19 (×5): 10 meq via INTRAVENOUS
  Filled 2020-06-19 (×2): qty 100

## 2020-06-19 MED ORDER — FERROUS FUMARATE 324 (106 FE) MG PO TABS
1.0000 | ORAL_TABLET | Freq: Every day | ORAL | Status: DC
  Administered 2020-06-19 – 2020-06-24 (×6): 106 mg via ORAL
  Filled 2020-06-19 (×6): qty 1

## 2020-06-19 MED ORDER — POTASSIUM CHLORIDE 20 MEQ PO PACK
40.0000 meq | PACK | Freq: Two times a day (BID) | ORAL | Status: AC
  Administered 2020-06-19 (×2): 40 meq via ORAL
  Filled 2020-06-19 (×2): qty 2

## 2020-06-19 MED ORDER — K PHOS MONO-SOD PHOS DI & MONO 155-852-130 MG PO TABS
500.0000 mg | ORAL_TABLET | Freq: Three times a day (TID) | ORAL | Status: AC
  Administered 2020-06-19 (×2): 500 mg via ORAL
  Filled 2020-06-19 (×2): qty 2
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
## 2020-06-19 NOTE — Progress Notes (Signed)
Phos=1.6, K-phos PO x2  Iron sat=11%, start Iron supplement.

## 2020-06-19 NOTE — Progress Notes (Addendum)
IP rehab admissions - we have received a denial for CIR admission from [facility].  A peer to peer was completed by MD, but the denial remains in place.  I will discuss with patient and his family.  Call for questions.  [phone]  I met with patient and his wife.  They are open to going to a SNF for therapies.  I have given wife the denial letter from [facility].  I will let CM and SW know of denial and need to pursue SNF placement.  [phone]
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
## 2020-06-19 NOTE — TOC Progression Note (Addendum)
Transition of Care [facility]) - Progression Note    Patient Details  Name: Frank Hernandez MRN: [id number] Date of Birth: 08-Oct-1940  Transition of Care [facility]) CM/SW [place], [place] Phone Number: 06/19/2020, 4:19 PM  Clinical Narrative:     Pts insurance denied CIR. Pt has no snf bed offers at this time. CSW faxed further out.  Expected Discharge Plan: [place] Barriers to Discharge: Continued Medical Work up  Expected Discharge Plan and Services Expected Discharge Plan: [place]   Discharge Planning Services: CM Consult Post Acute Care Choice: [place] Living arrangements for the past 2 months: Single Family Home                                       Social Determinants of Health (SDOH) Interventions    Readmission Risk Interventions No flowsheet data found.  Emeterio Reeve, Latanya Presser, [place] Social Worker [phone]

## 2020-06-20 LAB — PHOSPHORUS: Phosphorus: 2.8 mg/dL (ref 2.5–4.6)

## 2020-06-20 LAB — SARS CORONAVIRUS 2 (TAT 6-24 HRS): SARS Coronavirus 2: POSITIVE — AB

## 2020-06-20 LAB — GLUCOSE, CAPILLARY
Glucose-Capillary: 101 mg/dL — ABNORMAL HIGH (ref 70–99)
Glucose-Capillary: 135 mg/dL — ABNORMAL HIGH (ref 70–99)
Glucose-Capillary: 88 mg/dL (ref 70–99)
Glucose-Capillary: 109 mg/dL — ABNORMAL HIGH (ref 70–99)

## 2020-06-20 LAB — BASIC METABOLIC PANEL
Anion gap: 6 (ref 5–15)
BUN: 20 mg/dL (ref 8–23)
CO2: 20 mmol/L — ABNORMAL LOW (ref 22–32)
Calcium: 8.9 mg/dL (ref 8.9–10.3)
Chloride: 112 mmol/L — ABNORMAL HIGH (ref 98–111)
Creatinine, Ser: 1.32 mg/dL — ABNORMAL HIGH (ref 0.61–1.24)
GFR, Estimated: 55 mL/min — ABNORMAL LOW (ref >60)
Glucose, Bld: 103 mg/dL — ABNORMAL HIGH (ref 70–99)
Potassium: 3.3 mmol/L — ABNORMAL LOW (ref 3.5–5.1)
Sodium: 138 mmol/L (ref 135–145)

## 2020-06-20 MED ORDER — POTASSIUM CHLORIDE 20 MEQ PO PACK
40.0000 meq | PACK | Freq: Every day | ORAL | Status: DC
  Administered 2020-06-20 – 2020-06-21 (×2): 40 meq via ORAL
  Filled 2020-06-20: qty 2
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
## 2020-06-20 NOTE — Progress Notes (Signed)
Received pt's positive result for Covid test, X. Blount was paged about the result and pt's wife, Mrs. Reilly Blades, was also informed . Pt placed on airborne and Contact precaution.

## 2020-06-20 NOTE — TOC Progression Note (Signed)
Transition of Care [facility]) - Progression Note    Patient Details  Name: [name] MRN: [id number] Date of Birth: October 09, 1940  [facility] CM/SW Contact  Ella Bodo, RN Phone Number: 06/20/2020, 11:50  Clinical Narrative:  Pt and wife given bed offers for SNF; they are disappointed, as none of them are in [place].  Called [facility] to see if they are able to accept patient.    1345pm Addendum:  Unfortunately, [place] is not in network with [facility], and [facility] are unable to take due to [place].  Pt/wife state they will review offers and make a decision.    1630pm Addendum: Pt/wife have chosen [facility] in [place].  Spoke with Jackelyn Poling at [facility] and accepted bed.  Pt's insurance is not managed by [facility]; facility to initiate insurance auth today, and estimates that they should have auth by Monday, 06/23/2020.  Will follow up with facility regarding insurance auth.      Expected Discharge Plan: Skilled Nursing Facility Barriers to Discharge: Ship broker  Expected Discharge Plan and Services Expected Discharge Plan: [place]   Discharge Planning Services: CM Consult Post Acute Care Choice: [place] Living arrangements for the past 2 months: Single Family Home                                       Social Determinants of Health (SDOH) Interventions    Readmission Risk Interventions No flowsheet data found.  Reinaldo Raddle, RN, BSN  Trauma/Neuro ICU Case Manager [phone]

## 2020-06-20 NOTE — Progress Notes (Signed)
Inpatient Rehab Admissions Coordinator:   Insurance denied CIR. I do not believe that I could win an appeal, so CIR will sign off. TOC working on SNF placement.   Clemens Catholic, [place], [place] Admissions Coordinator  [phone] ([place]) [phone] (office)

## 2020-06-21 LAB — BASIC METABOLIC PANEL
Anion gap: 6 (ref 5–15)
BUN: 16 mg/dL (ref 8–23)
CO2: 21 mmol/L — ABNORMAL LOW (ref 22–32)
Calcium: 9 mg/dL (ref 8.9–10.3)
Chloride: 110 mmol/L (ref 98–111)
Creatinine, Ser: 1.23 mg/dL (ref 0.61–1.24)
GFR, Estimated: 60 mL/min — ABNORMAL LOW (ref >60)
Glucose, Bld: 96 mg/dL (ref 70–99)
Potassium: 3.1 mmol/L — ABNORMAL LOW (ref 3.5–5.1)
Sodium: 137 mmol/L (ref 135–145)

## 2020-06-21 LAB — CBC WITH DIFFERENTIAL/PLATELET
Abs Immature Granulocytes: 0.03 10*3/uL (ref 0.00–0.07)
Basophils Absolute: 0 10*3/uL (ref 0.0–0.1)
Basophils Relative: 0 %
Eosinophils Absolute: 0.3 10*3/uL (ref 0.0–0.5)
Eosinophils Relative: 6 %
HCT: 24.4 % — ABNORMAL LOW (ref 39.0–52.0)
Hemoglobin: 8 g/dL — ABNORMAL LOW (ref 13.0–17.0)
Immature Granulocytes: 1 %
Lymphocytes Relative: 22 %
Lymphs Abs: 1.2 10*3/uL (ref 0.7–4.0)
MCH: 29.5 pg (ref 26.0–34.0)
MCHC: 32.8 g/dL (ref 30.0–36.0)
MCV: 90 fL (ref 80.0–100.0)
Monocytes Absolute: 0.8 10*3/uL (ref 0.1–1.0)
Monocytes Relative: 16 %
Neutro Abs: 2.9 10*3/uL (ref 1.7–7.7)
Neutrophils Relative %: 55 %
Platelets: 306 10*3/uL (ref 150–400)
RBC: 2.71 MIL/uL — ABNORMAL LOW (ref 4.22–5.81)
RDW: 15.6 % — ABNORMAL HIGH (ref 11.5–15.5)
WBC: 5.2 10*3/uL (ref 4.0–10.5)
nRBC: 0 % (ref 0.0–0.2)

## 2020-06-21 LAB — GLUCOSE, CAPILLARY
Glucose-Capillary: 95 mg/dL (ref 70–99)
Glucose-Capillary: 96 mg/dL (ref 70–99)
Glucose-Capillary: 98 mg/dL (ref 70–99)
Glucose-Capillary: 74 mg/dL (ref 70–99)
Glucose-Capillary: 90 mg/dL (ref 70–99)

## 2020-06-21 LAB — MAGNESIUM: Magnesium: 1.7 mg/dL (ref 1.7–2.4)

## 2020-06-21 LAB — SARS CORONAVIRUS 2 (TAT 6-24 HRS): SARS Coronavirus 2: NEGATIVE

## 2020-06-21 LAB — PHOSPHORUS: Phosphorus: 2.3 mg/dL — ABNORMAL LOW (ref 2.5–4.6)

## 2020-06-21 MED ORDER — MAGNESIUM OXIDE 400 (241.3 MG) MG PO TABS
800.0000 mg | ORAL_TABLET | Freq: Once | ORAL | Status: AC
  Administered 2020-06-21: 800 mg via ORAL
  Filled 2020-06-21: qty 2

## 2020-06-21 MED ORDER — LOPERAMIDE HCL 2 MG PO CAPS
2.0000 mg | ORAL_CAPSULE | Freq: Two times a day (BID) | ORAL | Status: DC | PRN
  Administered 2020-06-21: 2 mg via ORAL
  Filled 2020-06-21: qty 1

## 2020-06-21 MED ORDER — METHOCARBAMOL 750 MG PO TABS
750.0000 mg | ORAL_TABLET | Freq: Three times a day (TID) | ORAL | Status: DC | PRN
  Administered 2020-06-23 – 2020-06-24 (×2): 750 mg via ORAL
  Filled 2020-06-21 (×3): qty 1

## 2020-06-21 MED ORDER — LOPERAMIDE HCL 2 MG PO CAPS
2.0000 mg | ORAL_CAPSULE | Freq: Two times a day (BID) | ORAL | Status: DC
  Administered 2020-06-21: 2 mg via ORAL
  Filled 2020-06-21: qty 1

## 2020-06-21 MED ORDER — POTASSIUM CHLORIDE 20 MEQ PO PACK
40.0000 meq | PACK | Freq: Three times a day (TID) | ORAL | Status: AC
  Administered 2020-06-21 (×2): 40 meq via ORAL
  Filled 2020-06-21 (×2): qty 2

## 2020-06-21 MED ORDER — ACETAMINOPHEN 325 MG PO TABS
650.0000 mg | ORAL_TABLET | Freq: Four times a day (QID) | ORAL | Status: DC | PRN
  Administered 2020-06-21 – 2020-06-24 (×3): 650 mg via ORAL
  Filled 2020-06-21 (×4): qty 2

## 2020-06-21 MED ORDER — K PHOS MONO-SOD PHOS DI & MONO 155-852-130 MG PO TABS
500.0000 mg | ORAL_TABLET | Freq: Two times a day (BID) | ORAL | Status: AC
  Administered 2020-06-21 (×2): 500 mg via ORAL
  Filled 2020-06-21 (×2): qty 2
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
## 2020-06-21 NOTE — NC FL2 (Signed)
[place] LEVEL OF CARE SCREENING TOOL     IDENTIFICATION  Patient Name: Frank Hernandez Birthdate: October 06, 1940 Sex: male Admission Date (Current Location): 06/10/2020  [facility] and [place] Number:  Herbalist and Address:  [facility], [place] [address] [place], [place] [address]      Provider Number: [id number]  Attending Physician Name and Address:  Md, Trauma, MD  Relative Name and Phone Number:  Mousa Prout, wife [phone]    Current Level of Care: Hospital Recommended Level of Care: [place] Prior Approval Number:    Date Approved/Denied:   PASRR Number: [id number]  Discharge Plan: SNF    Current Diagnoses: Patient Active Problem List   Diagnosis Date Noted  . Hypokalemia   . Rib fractures 06/10/2020  . Severe essential hypertension 11/15/2018  . Syncope 12/26/2015  . Pre-diabetes 12/26/2015  . Hypertension     Orientation RESPIRATION BLADDER Height & Weight     Time,Self,Situation,Place  Normal Continent Weight: 226 lb (102.5 kg) Height:  5' 9.5" (176.5 cm)  BEHAVIORAL SYMPTOMS/MOOD NEUROLOGICAL BOWEL NUTRITION STATUS      Continent Diet (See DC summary)  AMBULATORY STATUS COMMUNICATION OF NEEDS Skin   Extensive Assist Verbally Normal                       Personal Care Assistance Level of Assistance  Bathing,Feeding,Dressing Bathing Assistance: Limited assistance Feeding assistance: Independent Dressing Assistance: Limited assistance     Functional Limitations Info  Sight,Hearing,Speech Sight Info: Adequate Hearing Info: Adequate Speech Info: Adequate    SPECIAL CARE FACTORS FREQUENCY  PT (By licensed PT),OT (By licensed OT)     PT Frequency: 5x a week OT Frequency: 5x a week            Contractures Contractures Info: Not present    Additional Factors Info  Code Status Code Status Info: Full             Current Medications (06/21/2020):  This is the  current hospital active medication list Current Facility-Administered Medications  Medication Dose Route Frequency Provider Last Rate Last Admin  . acetaminophen (TYLENOL) tablet 650 mg  650 mg Oral Q6H PRN Romana Juniper A, MD      . diphenhydrAMINE (BENADRYL) capsule 25 mg  25 mg Oral QHS PRN Norm Parcel, PA-C      . enoxaparin (LOVENOX) injection 30 mg  30 mg Subcutaneous Q12H Patrecia Pace A, PA-C   30 mg at 06/21/20 1044  . Ferrous Fumarate (HEMOCYTE - 106 mg FE) tablet 106 mg of iron  1 tablet Oral Daily Wynetta Fines T, MD   106 mg of iron at 06/21/20 1045  . irbesartan (AVAPRO) tablet 150 mg  150 mg Oral Daily Haddix, Thomasene Lot, MD   150 mg at 06/21/20 1045  . loperamide (IMODIUM) capsule 2 mg  2 mg Oral BID PRN Meuth, Brooke A, PA-C   2 mg at 06/21/20 1045  . magnesium oxide (MAG-OX) tablet 800 mg  800 mg Oral Once Romana Juniper A, MD      . methocarbamol (ROBAXIN) tablet 750 mg  750 mg Oral Q8H PRN Romana Juniper A, MD      . metoCLOPramide (REGLAN) tablet 5-10 mg  5-10 mg Oral Q8H PRN Patrecia Pace A, PA-C       Or  . metoCLOPramide (REGLAN) injection 5-10 mg  5-10 mg Intravenous Q8H PRN Delray Alt, PA-C      .  metoprolol tartrate (LOPRESSOR) injection 5 mg  5 mg Intravenous Q6H PRN Delray Alt, PA-C      . ondansetron (ZOFRAN) tablet 4 mg  4 mg Oral Q6H PRN Patrecia Pace A, PA-C       Or  . ondansetron (ZOFRAN) injection 4 mg  4 mg Intravenous Q6H PRN Patrecia Pace A, PA-C      . phosphorus (K PHOS NEUTRAL) tablet 500 mg  500 mg Oral BID Dahal, Binaya, MD      . polyethylene glycol (MIRALAX / GLYCOLAX) packet 17 g  17 g Oral Daily PRN Wynetta Fines T, MD      . potassium chloride (KLOR-CON) packet 40 mEq  40 mEq Oral TID Clovis Riley, MD         Discharge Medications: Please see discharge summary for a list of discharge medications.  Relevant Imaging Results:  Relevant Lab Results:   Additional Information SSN: [id number]  Emeterio Reeve, [place]

## 2020-06-22 LAB — BASIC METABOLIC PANEL
Anion gap: 7 (ref 5–15)
BUN: 16 mg/dL (ref 8–23)
CO2: 20 mmol/L — ABNORMAL LOW (ref 22–32)
Calcium: 9.2 mg/dL (ref 8.9–10.3)
Chloride: 110 mmol/L (ref 98–111)
Creatinine, Ser: 1.25 mg/dL — ABNORMAL HIGH (ref 0.61–1.24)
GFR, Estimated: 59 mL/min — ABNORMAL LOW (ref >60)
Glucose, Bld: 88 mg/dL (ref 70–99)
Potassium: 3.2 mmol/L — ABNORMAL LOW (ref 3.5–5.1)
Sodium: 137 mmol/L (ref 135–145)

## 2020-06-22 LAB — GLUCOSE, CAPILLARY
Glucose-Capillary: 82 mg/dL (ref 70–99)
Glucose-Capillary: 83 mg/dL (ref 70–99)
Glucose-Capillary: 96 mg/dL (ref 70–99)
Glucose-Capillary: 89 mg/dL (ref 70–99)

## 2020-06-22 LAB — PHOSPHORUS: Phosphorus: 3 mg/dL (ref 2.5–4.6)

## 2020-06-22 MED ORDER — POTASSIUM CHLORIDE CRYS ER 20 MEQ PO TBCR
40.0000 meq | EXTENDED_RELEASE_TABLET | Freq: Two times a day (BID) | ORAL | Status: DC
  Administered 2020-06-22 – 2020-06-23 (×3): 40 meq via ORAL
  Filled 2020-06-22 (×3): qty 2

## 2020-06-22 MED ORDER — LOPERAMIDE HCL 2 MG PO CAPS
2.0000 mg | ORAL_CAPSULE | Freq: Three times a day (TID) | ORAL | Status: DC
  Administered 2020-06-22 – 2020-06-23 (×4): 2 mg via ORAL
  Filled 2020-06-22 (×4): qty 1
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
## 2020-06-22 NOTE — TOC Progression Note (Signed)
Transition of Care [facility]) - Progression Note    Patient Details  Name: Frank Hernandez MRN: [id number] Date of Birth: 28-Dec-1940  Transition of Care [facility]) CM/SW [place], [place] Phone Number: 06/22/2020, 9:42 AM  Clinical Narrative:    CSW contacted [facility] concerning pt's disposition CSW was informed that admission director was not there today and they only take patients on weekend after they are notified in advance of disposition.  CSW updated Margie Billet, [place] by secure chat.  CSW was not able to obtain information concerning protocol for covid testing (positive vs negative).  Pt may be able to discharge to facility on Monday.  TOC will continue to assist with disposition planning.   Expected Discharge Plan: Skilled Nursing Facility Barriers to Discharge: Ship broker  Expected Discharge Plan and Services Expected Discharge Plan: [place]   Discharge Planning Services: CM Consult Post Acute Care Choice: [place] Living arrangements for the past 2 months: Single Family Home                                       Social Determinants of Health (SDOH) Interventions    Readmission Risk Interventions No flowsheet data found.

## 2020-06-23 LAB — BASIC METABOLIC PANEL
Anion gap: 5 (ref 5–15)
BUN: 18 mg/dL (ref 8–23)
CO2: 22 mmol/L (ref 22–32)
Calcium: 9.6 mg/dL (ref 8.9–10.3)
Chloride: 109 mmol/L (ref 98–111)
Creatinine, Ser: 1.24 mg/dL (ref 0.61–1.24)
GFR, Estimated: 59 mL/min — ABNORMAL LOW (ref >60)
Glucose, Bld: 100 mg/dL — ABNORMAL HIGH (ref 70–99)
Potassium: 4.1 mmol/L (ref 3.5–5.1)
Sodium: 136 mmol/L (ref 135–145)

## 2020-06-23 LAB — GLUCOSE, CAPILLARY
Glucose-Capillary: 99 mg/dL (ref 70–99)
Glucose-Capillary: 103 mg/dL — ABNORMAL HIGH (ref 70–99)
Glucose-Capillary: 112 mg/dL — ABNORMAL HIGH (ref 70–99)
Glucose-Capillary: 110 mg/dL — ABNORMAL HIGH (ref 70–99)
Glucose-Capillary: 114 mg/dL — ABNORMAL HIGH (ref 70–99)

## 2020-06-23 LAB — PHOSPHORUS: Phosphorus: 2.5 mg/dL (ref 2.5–4.6)

## 2020-06-23 LAB — MAGNESIUM: Magnesium: 1.6 mg/dL — ABNORMAL LOW (ref 1.7–2.4)

## 2020-06-23 MED ORDER — MAGNESIUM SULFATE 2 GM/50ML IV SOLN
2.0000 g | Freq: Once | INTRAVENOUS | Status: AC
  Administered 2020-06-23: 2 g via INTRAVENOUS
  Filled 2020-06-23: qty 50

## 2020-06-23 MED ORDER — POTASSIUM CHLORIDE CRYS ER 20 MEQ PO TBCR
40.0000 meq | EXTENDED_RELEASE_TABLET | Freq: Every day | ORAL | Status: DC
  Administered 2020-06-24: 40 meq via ORAL
  Filled 2020-06-23: qty 2

## 2020-06-23 MED ORDER — LOPERAMIDE HCL 2 MG PO CAPS
2.0000 mg | ORAL_CAPSULE | Freq: Two times a day (BID) | ORAL | Status: DC
  Administered 2020-06-23 – 2020-06-24 (×2): 2 mg via ORAL
  Filled 2020-06-23 (×2): qty 1

## 2020-06-23 NOTE — Progress Notes (Signed)
Patient complain of R knee pain and swollen. Applied ice notified MD. Will continue to monitor.
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
## 2020-06-23 NOTE — TOC Progression Note (Signed)
Transition of Care [facility]) - Progression Note    Patient Details  Name: Frank Hernandez MRN: [id number] Date of Birth: [birth year]  [facility] CM/SW Contact  Ella Bodo, RN Phone Number: 06/23/2020, 1005  Clinical Narrative: Reached out to [place] with [facility] skilled nursing facility; insurance authorization is still pending at this time.  Will continue to follow with updates as they are available.  Updated patient and wife.  Addendum:  [place] Telephone call to [facility] with [facility]; still no word on insurance authorization.  Will follow-up with facility in a.m.  Expected Discharge Plan: Skilled Nursing Facility Barriers to Discharge: Ship broker  Expected Discharge Plan and Services Expected Discharge Plan: [place]   Discharge Planning Services: CM Consult Post Acute Care Choice: [place] Living arrangements for the past 2 months: Single Family Home                                       Social Determinants of Health (SDOH) Interventions    Readmission Risk Interventions No flowsheet data found.  Reinaldo Raddle, RN, BSN  Trauma/Neuro ICU Case Manager [phone]
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
## 2020-06-24 DIAGNOSIS — R41841 Cognitive communication deficit: Secondary | ICD-10-CM | POA: Diagnosis not present

## 2020-06-24 DIAGNOSIS — R279 Unspecified lack of coordination: Secondary | ICD-10-CM | POA: Diagnosis not present

## 2020-06-24 DIAGNOSIS — I10 Essential (primary) hypertension: Secondary | ICD-10-CM | POA: Diagnosis not present

## 2020-06-24 DIAGNOSIS — R278 Other lack of coordination: Secondary | ICD-10-CM | POA: Diagnosis not present

## 2020-06-24 DIAGNOSIS — M6281 Muscle weakness (generalized): Secondary | ICD-10-CM | POA: Diagnosis not present

## 2020-06-24 DIAGNOSIS — E876 Hypokalemia: Secondary | ICD-10-CM | POA: Diagnosis not present

## 2020-06-24 DIAGNOSIS — S2241XD Multiple fractures of ribs, right side, subsequent encounter for fracture with routine healing: Secondary | ICD-10-CM | POA: Diagnosis not present

## 2020-06-24 DIAGNOSIS — T148XXA Other injury of unspecified body region, initial encounter: Secondary | ICD-10-CM | POA: Diagnosis not present

## 2020-06-24 DIAGNOSIS — D62 Acute posthemorrhagic anemia: Secondary | ICD-10-CM | POA: Diagnosis not present

## 2020-06-24 DIAGNOSIS — I951 Orthostatic hypotension: Secondary | ICD-10-CM | POA: Diagnosis not present

## 2020-06-24 DIAGNOSIS — R197 Diarrhea, unspecified: Secondary | ICD-10-CM | POA: Diagnosis not present

## 2020-06-24 DIAGNOSIS — R2689 Other abnormalities of gait and mobility: Secondary | ICD-10-CM | POA: Diagnosis not present

## 2020-06-24 DIAGNOSIS — Z79899 Other long term (current) drug therapy: Secondary | ICD-10-CM | POA: Diagnosis not present

## 2020-06-24 DIAGNOSIS — R5381 Other malaise: Secondary | ICD-10-CM | POA: Diagnosis not present

## 2020-06-24 DIAGNOSIS — D649 Anemia, unspecified: Secondary | ICD-10-CM | POA: Diagnosis not present

## 2020-06-24 DIAGNOSIS — S2241XA Multiple fractures of ribs, right side, initial encounter for closed fracture: Secondary | ICD-10-CM | POA: Diagnosis not present

## 2020-06-24 DIAGNOSIS — R52 Pain, unspecified: Secondary | ICD-10-CM | POA: Diagnosis not present

## 2020-06-24 DIAGNOSIS — S72002D Fracture of unspecified part of neck of left femur, subsequent encounter for closed fracture with routine healing: Secondary | ICD-10-CM | POA: Diagnosis not present

## 2020-06-24 DIAGNOSIS — S0003XD Contusion of scalp, subsequent encounter: Secondary | ICD-10-CM | POA: Diagnosis not present

## 2020-06-24 DIAGNOSIS — S72009A Fracture of unspecified part of neck of unspecified femur, initial encounter for closed fracture: Secondary | ICD-10-CM | POA: Diagnosis not present

## 2020-06-24 DIAGNOSIS — S72142D Displaced intertrochanteric fracture of left femur, subsequent encounter for closed fracture with routine healing: Secondary | ICD-10-CM | POA: Diagnosis not present

## 2020-06-24 DIAGNOSIS — N179 Acute kidney failure, unspecified: Secondary | ICD-10-CM | POA: Diagnosis not present

## 2020-06-24 DIAGNOSIS — Z743 Need for continuous supervision: Secondary | ICD-10-CM | POA: Diagnosis not present

## 2020-06-24 LAB — BASIC METABOLIC PANEL
Anion gap: 9 (ref 5–15)
BUN: 17 mg/dL (ref 8–23)
CO2: 20 mmol/L — ABNORMAL LOW (ref 22–32)
Calcium: 9.6 mg/dL (ref 8.9–10.3)
Chloride: 107 mmol/L (ref 98–111)
Creatinine, Ser: 1.16 mg/dL (ref 0.61–1.24)
GFR, Estimated: >60 mL/min (ref >60)
Glucose, Bld: 110 mg/dL — ABNORMAL HIGH (ref 70–99)
Potassium: 3.6 mmol/L (ref 3.5–5.1)
Sodium: 136 mmol/L (ref 135–145)

## 2020-06-24 LAB — CBC
HCT: 27.3 % — ABNORMAL LOW (ref 39.0–52.0)
Hemoglobin: 8.8 g/dL — ABNORMAL LOW (ref 13.0–17.0)
MCH: 29.4 pg (ref 26.0–34.0)
MCHC: 32.2 g/dL (ref 30.0–36.0)
MCV: 91.3 fL (ref 80.0–100.0)
Platelets: 385 10*3/uL (ref 150–400)
RBC: 2.99 MIL/uL — ABNORMAL LOW (ref 4.22–5.81)
RDW: 15.8 % — ABNORMAL HIGH (ref 11.5–15.5)
WBC: 7.7 10*3/uL (ref 4.0–10.5)
nRBC: 0 % (ref 0.0–0.2)

## 2020-06-24 LAB — GLUCOSE, CAPILLARY
Glucose-Capillary: 103 mg/dL — ABNORMAL HIGH (ref 70–99)
Glucose-Capillary: 120 mg/dL — ABNORMAL HIGH (ref 70–99)
Glucose-Capillary: 109 mg/dL — ABNORMAL HIGH (ref 70–99)
Glucose-Capillary: 119 mg/dL — ABNORMAL HIGH (ref 70–99)

## 2020-06-24 MED ORDER — METHOCARBAMOL 750 MG PO TABS: 750.0000 mg | ORAL_TABLET | Freq: Three times a day (TID) | ORAL | Status: DC | PRN

## 2020-06-24 MED ORDER — POLYETHYLENE GLYCOL 3350 17 G PO PACK: 17.0000 g | PACK | Freq: Every day | ORAL | 0 refills | Status: DC | PRN

## 2020-06-24 MED ORDER — IRBESARTAN 150 MG PO TABS: 150.0000 mg | ORAL_TABLET | Freq: Every day | ORAL | Status: DC

## 2020-06-24 MED ORDER — LOPERAMIDE HCL 2 MG PO CAPS: 2.0000 mg | ORAL_CAPSULE | Freq: Two times a day (BID) | ORAL | Status: DC | PRN

## 2020-06-24 MED ORDER — CELECOXIB 200 MG PO CAPS: 200.0000 mg | ORAL_CAPSULE | Freq: Two times a day (BID) | ORAL | Status: DC

## 2020-06-24 MED ORDER — ACETAMINOPHEN 325 MG PO TABS: 650.0000 mg | ORAL_TABLET | Freq: Four times a day (QID) | ORAL | Status: DC | PRN

## 2020-06-24 MED ORDER — POTASSIUM CHLORIDE CRYS ER 20 MEQ PO TBCR: 40.0000 meq | EXTENDED_RELEASE_TABLET | Freq: Every day | ORAL | Status: DC

## 2020-06-24 MED ORDER — CELECOXIB 200 MG PO CAPS
200.0000 mg | ORAL_CAPSULE | Freq: Two times a day (BID) | ORAL | Status: DC
  Administered 2020-06-24: 200 mg via ORAL
  Filled 2020-06-24 (×2): qty 1

## 2020-06-24 MED ORDER — FERROUS FUMARATE 324 (106 FE) MG PO TABS: 1.0000 | ORAL_TABLET | Freq: Every day | ORAL | Status: DC
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
## 2020-06-24 NOTE — Progress Notes (Signed)
Report Given to [employer] @[facility] tel#[phone].

## 2020-06-24 NOTE — TOC Transition Note (Addendum)
Transition of Care [facility]) - CM/SW Discharge Note   Patient Details  Name: Frank Hernandez MRN: [id number] Date of Birth: [birth year]  [facility] CM/SW Contact:  Ella Bodo, RN Phone Number: 06/24/2020, 3:00 PM   Clinical Narrative:  Insurance authorization has been received for admission to [facility] today.  Pt/wife aware; plan transport by non-emergent ambulance.  Pt going to Room B-14, bed 1.  Bedside nurse to call report to [phone].    Addendum: 3:30pm [facility] notified for transport; they stated that they estimate pickup time to be 2 hours or more from now.    Final next level of care: Skilled Nursing Facility Barriers to Discharge: Barriers Resolved   Patient Goals and CMS Choice Patient states their goals for this hospitalization and ongoing recovery are:: to go home CMS Medicare.gov Compare Post Acute Care list provided to:: Patient Choice offered to / list presented to : [facility]  Discharge Placement                       Discharge Plan and Services   Discharge Planning Services: CM Consult Post Acute Care Choice: [place]                               Social Determinants of Health (SDOH) Interventions     Readmission Risk Interventions No flowsheet data found.  Reinaldo Raddle, RN, BSN  Trauma/Neuro ICU Case Manager [phone]
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
## 2020-06-24 NOTE — Plan of Care (Signed)
  Problem: Education: Goal: Knowledge of General Education information will improve Description Including pain rating scale, medication(s)/side effects and non-pharmacologic comfort measures Outcome: Progressing   Problem: Health Behavior/Discharge Planning: Goal: Ability to manage health-related needs will improve Outcome: Progressing   

## 2020-06-24 NOTE — Progress Notes (Signed)
Pt ready for discharge to [facility] in [place].  [facility] is to transport pt. DC instructions given and reviewed with pt.
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
## 2020-06-26 DIAGNOSIS — I10 Essential (primary) hypertension: Secondary | ICD-10-CM | POA: Diagnosis not present

## 2020-06-26 DIAGNOSIS — S2241XD Multiple fractures of ribs, right side, subsequent encounter for fracture with routine healing: Secondary | ICD-10-CM | POA: Diagnosis not present

## 2020-06-26 DIAGNOSIS — S72142D Displaced intertrochanteric fracture of left femur, subsequent encounter for closed fracture with routine healing: Secondary | ICD-10-CM | POA: Diagnosis not present

## 2020-06-26 DIAGNOSIS — Z79899 Other long term (current) drug therapy: Secondary | ICD-10-CM | POA: Diagnosis not present

## 2020-06-26 DIAGNOSIS — T148XXA Other injury of unspecified body region, initial encounter: Secondary | ICD-10-CM | POA: Diagnosis not present

## 2020-06-26 DIAGNOSIS — S0003XD Contusion of scalp, subsequent encounter: Secondary | ICD-10-CM | POA: Diagnosis not present

## 2020-06-26 DIAGNOSIS — S72002D Fracture of unspecified part of neck of left femur, subsequent encounter for closed fracture with routine healing: Secondary | ICD-10-CM | POA: Diagnosis not present

## 2020-07-01 DIAGNOSIS — S72002D Fracture of unspecified part of neck of left femur, subsequent encounter for closed fracture with routine healing: Secondary | ICD-10-CM | POA: Diagnosis not present

## 2020-07-01 DIAGNOSIS — R5381 Other malaise: Secondary | ICD-10-CM | POA: Diagnosis not present

## 2020-07-03 DIAGNOSIS — Z79899 Other long term (current) drug therapy: Secondary | ICD-10-CM | POA: Diagnosis not present

## 2020-07-03 DIAGNOSIS — I951 Orthostatic hypotension: Secondary | ICD-10-CM | POA: Diagnosis not present

## 2020-07-03 DIAGNOSIS — I10 Essential (primary) hypertension: Secondary | ICD-10-CM | POA: Diagnosis not present

## 2020-07-03 DIAGNOSIS — R5381 Other malaise: Secondary | ICD-10-CM | POA: Diagnosis not present

## 2020-07-08 DIAGNOSIS — S72142D Displaced intertrochanteric fracture of left femur, subsequent encounter for closed fracture with routine healing: Secondary | ICD-10-CM | POA: Diagnosis not present

## 2020-07-14 DIAGNOSIS — S72142D Displaced intertrochanteric fracture of left femur, subsequent encounter for closed fracture with routine healing: Secondary | ICD-10-CM | POA: Diagnosis not present

## 2020-07-14 DIAGNOSIS — S2241XD Multiple fractures of ribs, right side, subsequent encounter for fracture with routine healing: Secondary | ICD-10-CM | POA: Diagnosis not present

## 2020-07-14 DIAGNOSIS — S0003XD Contusion of scalp, subsequent encounter: Secondary | ICD-10-CM | POA: Diagnosis not present

## 2020-07-14 DIAGNOSIS — R52 Pain, unspecified: Secondary | ICD-10-CM | POA: Diagnosis not present

## 2020-07-14 DIAGNOSIS — S72002D Fracture of unspecified part of neck of left femur, subsequent encounter for closed fracture with routine healing: Secondary | ICD-10-CM | POA: Diagnosis not present

## 2020-07-14 DIAGNOSIS — I10 Essential (primary) hypertension: Secondary | ICD-10-CM | POA: Diagnosis not present

## 2020-07-16 ENCOUNTER — Other Ambulatory Visit: Payer: Self-pay

## 2020-07-16 NOTE — Patient Outreach (Signed)
[facility]) Care Management  07/16/2020  PAULETTE LYNCH 1941/01/19 [id number]     Transition of Care Referral  Referral Date: 07/16/2020 Referral Source: [employer] Discharge report Date of Discharge: 4/76/5465 Facility: [facility]    Referral received. Patient assigned to PCP office with [facility] and will follow up with patient as deemed necessary.    Plan: RN CM will close referral at this time.    Enzo Montgomery, RN,BSN,CCM [facility] Telephonic Care Management Coordinator Direct Phone: [phone] Toll Free: [phone] Fax: [phone]

## 2020-07-18 DIAGNOSIS — S72002D Fracture of unspecified part of neck of left femur, subsequent encounter for closed fracture with routine healing: Secondary | ICD-10-CM | POA: Diagnosis not present

## 2020-07-18 DIAGNOSIS — R41841 Cognitive communication deficit: Secondary | ICD-10-CM | POA: Diagnosis not present

## 2020-07-18 DIAGNOSIS — D649 Anemia, unspecified: Secondary | ICD-10-CM | POA: Diagnosis not present

## 2020-07-18 DIAGNOSIS — S0083XD Contusion of other part of head, subsequent encounter: Secondary | ICD-10-CM | POA: Diagnosis not present

## 2020-07-18 DIAGNOSIS — S2241XD Multiple fractures of ribs, right side, subsequent encounter for fracture with routine healing: Secondary | ICD-10-CM | POA: Diagnosis not present

## 2020-07-18 DIAGNOSIS — R32 Unspecified urinary incontinence: Secondary | ICD-10-CM | POA: Diagnosis not present

## 2020-07-18 DIAGNOSIS — S0003XD Contusion of scalp, subsequent encounter: Secondary | ICD-10-CM | POA: Diagnosis not present

## 2020-07-18 DIAGNOSIS — I951 Orthostatic hypotension: Secondary | ICD-10-CM | POA: Diagnosis not present

## 2020-07-18 DIAGNOSIS — I10 Essential (primary) hypertension: Secondary | ICD-10-CM | POA: Diagnosis not present

## 2020-07-22 DIAGNOSIS — S0083XD Contusion of other part of head, subsequent encounter: Secondary | ICD-10-CM | POA: Diagnosis not present

## 2020-07-22 DIAGNOSIS — D649 Anemia, unspecified: Secondary | ICD-10-CM | POA: Diagnosis not present

## 2020-07-22 DIAGNOSIS — R41841 Cognitive communication deficit: Secondary | ICD-10-CM | POA: Diagnosis not present

## 2020-07-22 DIAGNOSIS — S0003XD Contusion of scalp, subsequent encounter: Secondary | ICD-10-CM | POA: Diagnosis not present

## 2020-07-22 DIAGNOSIS — S2241XD Multiple fractures of ribs, right side, subsequent encounter for fracture with routine healing: Secondary | ICD-10-CM | POA: Diagnosis not present

## 2020-07-22 DIAGNOSIS — I951 Orthostatic hypotension: Secondary | ICD-10-CM | POA: Diagnosis not present

## 2020-07-22 DIAGNOSIS — I10 Essential (primary) hypertension: Secondary | ICD-10-CM | POA: Diagnosis not present

## 2020-07-22 DIAGNOSIS — S72002D Fracture of unspecified part of neck of left femur, subsequent encounter for closed fracture with routine healing: Secondary | ICD-10-CM | POA: Diagnosis not present

## 2020-07-22 DIAGNOSIS — R32 Unspecified urinary incontinence: Secondary | ICD-10-CM | POA: Diagnosis not present

## 2020-07-25 DIAGNOSIS — R41841 Cognitive communication deficit: Secondary | ICD-10-CM | POA: Diagnosis not present

## 2020-07-25 DIAGNOSIS — I10 Essential (primary) hypertension: Secondary | ICD-10-CM | POA: Diagnosis not present

## 2020-07-25 DIAGNOSIS — S72002D Fracture of unspecified part of neck of left femur, subsequent encounter for closed fracture with routine healing: Secondary | ICD-10-CM | POA: Diagnosis not present

## 2020-07-25 DIAGNOSIS — S2241XD Multiple fractures of ribs, right side, subsequent encounter for fracture with routine healing: Secondary | ICD-10-CM | POA: Diagnosis not present

## 2020-07-25 DIAGNOSIS — S0003XD Contusion of scalp, subsequent encounter: Secondary | ICD-10-CM | POA: Diagnosis not present

## 2020-07-25 DIAGNOSIS — D649 Anemia, unspecified: Secondary | ICD-10-CM | POA: Diagnosis not present

## 2020-07-25 DIAGNOSIS — I951 Orthostatic hypotension: Secondary | ICD-10-CM | POA: Diagnosis not present

## 2020-07-25 DIAGNOSIS — S0083XD Contusion of other part of head, subsequent encounter: Secondary | ICD-10-CM | POA: Diagnosis not present

## 2020-07-25 DIAGNOSIS — R32 Unspecified urinary incontinence: Secondary | ICD-10-CM | POA: Diagnosis not present

## 2020-07-31 DIAGNOSIS — S72002D Fracture of unspecified part of neck of left femur, subsequent encounter for closed fracture with routine healing: Secondary | ICD-10-CM | POA: Diagnosis not present

## 2020-07-31 DIAGNOSIS — S0003XD Contusion of scalp, subsequent encounter: Secondary | ICD-10-CM | POA: Diagnosis not present

## 2020-07-31 DIAGNOSIS — R32 Unspecified urinary incontinence: Secondary | ICD-10-CM | POA: Diagnosis not present

## 2020-07-31 DIAGNOSIS — I10 Essential (primary) hypertension: Secondary | ICD-10-CM | POA: Diagnosis not present

## 2020-07-31 DIAGNOSIS — S2241XD Multiple fractures of ribs, right side, subsequent encounter for fracture with routine healing: Secondary | ICD-10-CM | POA: Diagnosis not present

## 2020-07-31 DIAGNOSIS — S0083XD Contusion of other part of head, subsequent encounter: Secondary | ICD-10-CM | POA: Diagnosis not present

## 2020-07-31 DIAGNOSIS — D649 Anemia, unspecified: Secondary | ICD-10-CM | POA: Diagnosis not present

## 2020-07-31 DIAGNOSIS — I951 Orthostatic hypotension: Secondary | ICD-10-CM | POA: Diagnosis not present

## 2020-07-31 DIAGNOSIS — R41841 Cognitive communication deficit: Secondary | ICD-10-CM | POA: Diagnosis not present

## 2020-08-05 ENCOUNTER — Other Ambulatory Visit (HOSPITAL_COMMUNITY): Payer: Self-pay | Admitting: Student

## 2020-08-05 ENCOUNTER — Other Ambulatory Visit: Payer: Self-pay | Admitting: Student

## 2020-08-05 DIAGNOSIS — M25511 Pain in right shoulder: Secondary | ICD-10-CM | POA: Diagnosis not present

## 2020-08-05 DIAGNOSIS — M79662 Pain in left lower leg: Secondary | ICD-10-CM

## 2020-08-05 DIAGNOSIS — S72142D Displaced intertrochanteric fracture of left femur, subsequent encounter for closed fracture with routine healing: Secondary | ICD-10-CM | POA: Diagnosis not present

## 2020-08-06 ENCOUNTER — Ambulatory Visit (HOSPITAL_COMMUNITY)
Admission: RE | Admit: 2020-08-06 | Discharge: 2020-08-06 | Disposition: A | Discharge status: Home / Self Care | Payer: Medicare HMO | Source: Ambulatory Visit | Attending: Student | Admitting: Student

## 2020-08-06 ENCOUNTER — Other Ambulatory Visit: Payer: Self-pay

## 2020-08-06 DIAGNOSIS — R7303 Prediabetes: Secondary | ICD-10-CM | POA: Diagnosis not present

## 2020-08-06 DIAGNOSIS — S2241XA Multiple fractures of ribs, right side, initial encounter for closed fracture: Secondary | ICD-10-CM | POA: Diagnosis not present

## 2020-08-06 DIAGNOSIS — M25552 Pain in left hip: Secondary | ICD-10-CM | POA: Diagnosis not present

## 2020-08-06 DIAGNOSIS — M7989 Other specified soft tissue disorders: Secondary | ICD-10-CM | POA: Insufficient documentation

## 2020-08-06 DIAGNOSIS — E876 Hypokalemia: Secondary | ICD-10-CM | POA: Diagnosis not present

## 2020-08-06 DIAGNOSIS — M79662 Pain in left lower leg: Secondary | ICD-10-CM | POA: Diagnosis not present

## 2020-08-06 DIAGNOSIS — I10 Essential (primary) hypertension: Secondary | ICD-10-CM | POA: Diagnosis not present

## 2020-08-06 DIAGNOSIS — R600 Localized edema: Secondary | ICD-10-CM | POA: Diagnosis not present

## 2020-08-06 DIAGNOSIS — D649 Anemia, unspecified: Secondary | ICD-10-CM | POA: Diagnosis not present

## 2020-08-07 DIAGNOSIS — I951 Orthostatic hypotension: Secondary | ICD-10-CM | POA: Diagnosis not present

## 2020-08-07 DIAGNOSIS — S2241XD Multiple fractures of ribs, right side, subsequent encounter for fracture with routine healing: Secondary | ICD-10-CM | POA: Diagnosis not present

## 2020-08-07 DIAGNOSIS — S0003XD Contusion of scalp, subsequent encounter: Secondary | ICD-10-CM | POA: Diagnosis not present

## 2020-08-07 DIAGNOSIS — S72002D Fracture of unspecified part of neck of left femur, subsequent encounter for closed fracture with routine healing: Secondary | ICD-10-CM | POA: Diagnosis not present

## 2020-08-07 DIAGNOSIS — R32 Unspecified urinary incontinence: Secondary | ICD-10-CM | POA: Diagnosis not present

## 2020-08-07 DIAGNOSIS — I10 Essential (primary) hypertension: Secondary | ICD-10-CM | POA: Diagnosis not present

## 2020-08-07 DIAGNOSIS — S0083XD Contusion of other part of head, subsequent encounter: Secondary | ICD-10-CM | POA: Diagnosis not present

## 2020-08-07 DIAGNOSIS — D649 Anemia, unspecified: Secondary | ICD-10-CM | POA: Diagnosis not present

## 2020-08-07 DIAGNOSIS — R41841 Cognitive communication deficit: Secondary | ICD-10-CM | POA: Diagnosis not present

## 2020-08-12 DIAGNOSIS — R32 Unspecified urinary incontinence: Secondary | ICD-10-CM | POA: Diagnosis not present

## 2020-08-12 DIAGNOSIS — I951 Orthostatic hypotension: Secondary | ICD-10-CM | POA: Diagnosis not present

## 2020-08-12 DIAGNOSIS — R41841 Cognitive communication deficit: Secondary | ICD-10-CM | POA: Diagnosis not present

## 2020-08-12 DIAGNOSIS — S2241XD Multiple fractures of ribs, right side, subsequent encounter for fracture with routine healing: Secondary | ICD-10-CM | POA: Diagnosis not present

## 2020-08-12 DIAGNOSIS — D649 Anemia, unspecified: Secondary | ICD-10-CM | POA: Diagnosis not present

## 2020-08-12 DIAGNOSIS — S0003XD Contusion of scalp, subsequent encounter: Secondary | ICD-10-CM | POA: Diagnosis not present

## 2020-08-12 DIAGNOSIS — I10 Essential (primary) hypertension: Secondary | ICD-10-CM | POA: Diagnosis not present

## 2020-08-12 DIAGNOSIS — S72002D Fracture of unspecified part of neck of left femur, subsequent encounter for closed fracture with routine healing: Secondary | ICD-10-CM | POA: Diagnosis not present

## 2020-08-12 DIAGNOSIS — S0083XD Contusion of other part of head, subsequent encounter: Secondary | ICD-10-CM | POA: Diagnosis not present

## 2020-08-13 DIAGNOSIS — S72002D Fracture of unspecified part of neck of left femur, subsequent encounter for closed fracture with routine healing: Secondary | ICD-10-CM | POA: Diagnosis not present

## 2020-08-13 DIAGNOSIS — R41841 Cognitive communication deficit: Secondary | ICD-10-CM | POA: Diagnosis not present

## 2020-08-13 DIAGNOSIS — S0083XD Contusion of other part of head, subsequent encounter: Secondary | ICD-10-CM | POA: Diagnosis not present

## 2020-08-13 DIAGNOSIS — R32 Unspecified urinary incontinence: Secondary | ICD-10-CM | POA: Diagnosis not present

## 2020-08-13 DIAGNOSIS — S0003XD Contusion of scalp, subsequent encounter: Secondary | ICD-10-CM | POA: Diagnosis not present

## 2020-08-13 DIAGNOSIS — D649 Anemia, unspecified: Secondary | ICD-10-CM | POA: Diagnosis not present

## 2020-08-13 DIAGNOSIS — I10 Essential (primary) hypertension: Secondary | ICD-10-CM | POA: Diagnosis not present

## 2020-08-13 DIAGNOSIS — S2241XD Multiple fractures of ribs, right side, subsequent encounter for fracture with routine healing: Secondary | ICD-10-CM | POA: Diagnosis not present

## 2020-08-13 DIAGNOSIS — I951 Orthostatic hypotension: Secondary | ICD-10-CM | POA: Diagnosis not present

## 2020-08-17 DIAGNOSIS — S0003XD Contusion of scalp, subsequent encounter: Secondary | ICD-10-CM | POA: Diagnosis not present

## 2020-08-17 DIAGNOSIS — R41841 Cognitive communication deficit: Secondary | ICD-10-CM | POA: Diagnosis not present

## 2020-08-17 DIAGNOSIS — I951 Orthostatic hypotension: Secondary | ICD-10-CM | POA: Diagnosis not present

## 2020-08-17 DIAGNOSIS — R32 Unspecified urinary incontinence: Secondary | ICD-10-CM | POA: Diagnosis not present

## 2020-08-17 DIAGNOSIS — S2241XD Multiple fractures of ribs, right side, subsequent encounter for fracture with routine healing: Secondary | ICD-10-CM | POA: Diagnosis not present

## 2020-08-17 DIAGNOSIS — I10 Essential (primary) hypertension: Secondary | ICD-10-CM | POA: Diagnosis not present

## 2020-08-17 DIAGNOSIS — D649 Anemia, unspecified: Secondary | ICD-10-CM | POA: Diagnosis not present

## 2020-08-17 DIAGNOSIS — S0083XD Contusion of other part of head, subsequent encounter: Secondary | ICD-10-CM | POA: Diagnosis not present

## 2020-08-17 DIAGNOSIS — S72002D Fracture of unspecified part of neck of left femur, subsequent encounter for closed fracture with routine healing: Secondary | ICD-10-CM | POA: Diagnosis not present

## 2020-08-18 DIAGNOSIS — S2241XD Multiple fractures of ribs, right side, subsequent encounter for fracture with routine healing: Secondary | ICD-10-CM | POA: Diagnosis not present

## 2020-08-18 DIAGNOSIS — I10 Essential (primary) hypertension: Secondary | ICD-10-CM | POA: Diagnosis not present

## 2020-08-18 DIAGNOSIS — M6281 Muscle weakness (generalized): Secondary | ICD-10-CM | POA: Diagnosis not present

## 2020-08-18 DIAGNOSIS — S72142D Displaced intertrochanteric fracture of left femur, subsequent encounter for closed fracture with routine healing: Secondary | ICD-10-CM | POA: Diagnosis not present

## 2020-08-19 DIAGNOSIS — I10 Essential (primary) hypertension: Secondary | ICD-10-CM | POA: Diagnosis not present

## 2020-08-19 DIAGNOSIS — I951 Orthostatic hypotension: Secondary | ICD-10-CM | POA: Diagnosis not present

## 2020-08-19 DIAGNOSIS — S0083XD Contusion of other part of head, subsequent encounter: Secondary | ICD-10-CM | POA: Diagnosis not present

## 2020-08-19 DIAGNOSIS — R41841 Cognitive communication deficit: Secondary | ICD-10-CM | POA: Diagnosis not present

## 2020-08-19 DIAGNOSIS — S72002D Fracture of unspecified part of neck of left femur, subsequent encounter for closed fracture with routine healing: Secondary | ICD-10-CM | POA: Diagnosis not present

## 2020-08-19 DIAGNOSIS — S2241XD Multiple fractures of ribs, right side, subsequent encounter for fracture with routine healing: Secondary | ICD-10-CM | POA: Diagnosis not present

## 2020-08-19 DIAGNOSIS — D649 Anemia, unspecified: Secondary | ICD-10-CM | POA: Diagnosis not present

## 2020-08-19 DIAGNOSIS — R32 Unspecified urinary incontinence: Secondary | ICD-10-CM | POA: Diagnosis not present

## 2020-08-19 DIAGNOSIS — S0003XD Contusion of scalp, subsequent encounter: Secondary | ICD-10-CM | POA: Diagnosis not present

## 2020-08-21 DIAGNOSIS — D649 Anemia, unspecified: Secondary | ICD-10-CM | POA: Diagnosis not present

## 2020-08-21 DIAGNOSIS — S0083XD Contusion of other part of head, subsequent encounter: Secondary | ICD-10-CM | POA: Diagnosis not present

## 2020-08-21 DIAGNOSIS — S2241XD Multiple fractures of ribs, right side, subsequent encounter for fracture with routine healing: Secondary | ICD-10-CM | POA: Diagnosis not present

## 2020-08-21 DIAGNOSIS — R32 Unspecified urinary incontinence: Secondary | ICD-10-CM | POA: Diagnosis not present

## 2020-08-21 DIAGNOSIS — S0003XD Contusion of scalp, subsequent encounter: Secondary | ICD-10-CM | POA: Diagnosis not present

## 2020-08-21 DIAGNOSIS — R41841 Cognitive communication deficit: Secondary | ICD-10-CM | POA: Diagnosis not present

## 2020-08-21 DIAGNOSIS — S72002D Fracture of unspecified part of neck of left femur, subsequent encounter for closed fracture with routine healing: Secondary | ICD-10-CM | POA: Diagnosis not present

## 2020-08-21 DIAGNOSIS — I10 Essential (primary) hypertension: Secondary | ICD-10-CM | POA: Diagnosis not present

## 2020-08-21 DIAGNOSIS — I951 Orthostatic hypotension: Secondary | ICD-10-CM | POA: Diagnosis not present

## 2020-08-25 DIAGNOSIS — E785 Hyperlipidemia, unspecified: Secondary | ICD-10-CM | POA: Diagnosis not present

## 2020-08-28 DIAGNOSIS — S0003XD Contusion of scalp, subsequent encounter: Secondary | ICD-10-CM | POA: Diagnosis not present

## 2020-08-28 DIAGNOSIS — I951 Orthostatic hypotension: Secondary | ICD-10-CM | POA: Diagnosis not present

## 2020-08-28 DIAGNOSIS — R32 Unspecified urinary incontinence: Secondary | ICD-10-CM | POA: Diagnosis not present

## 2020-08-28 DIAGNOSIS — D649 Anemia, unspecified: Secondary | ICD-10-CM | POA: Diagnosis not present

## 2020-08-28 DIAGNOSIS — R41841 Cognitive communication deficit: Secondary | ICD-10-CM | POA: Diagnosis not present

## 2020-08-28 DIAGNOSIS — S72002D Fracture of unspecified part of neck of left femur, subsequent encounter for closed fracture with routine healing: Secondary | ICD-10-CM | POA: Diagnosis not present

## 2020-08-28 DIAGNOSIS — S0083XD Contusion of other part of head, subsequent encounter: Secondary | ICD-10-CM | POA: Diagnosis not present

## 2020-08-28 DIAGNOSIS — I10 Essential (primary) hypertension: Secondary | ICD-10-CM | POA: Diagnosis not present

## 2020-08-28 DIAGNOSIS — S2241XD Multiple fractures of ribs, right side, subsequent encounter for fracture with routine healing: Secondary | ICD-10-CM | POA: Diagnosis not present

## 2020-09-03 DIAGNOSIS — S2241XD Multiple fractures of ribs, right side, subsequent encounter for fracture with routine healing: Secondary | ICD-10-CM | POA: Diagnosis not present

## 2020-09-03 DIAGNOSIS — R32 Unspecified urinary incontinence: Secondary | ICD-10-CM | POA: Diagnosis not present

## 2020-09-03 DIAGNOSIS — D649 Anemia, unspecified: Secondary | ICD-10-CM | POA: Diagnosis not present

## 2020-09-03 DIAGNOSIS — I10 Essential (primary) hypertension: Secondary | ICD-10-CM | POA: Diagnosis not present

## 2020-09-03 DIAGNOSIS — S0083XD Contusion of other part of head, subsequent encounter: Secondary | ICD-10-CM | POA: Diagnosis not present

## 2020-09-03 DIAGNOSIS — R41841 Cognitive communication deficit: Secondary | ICD-10-CM | POA: Diagnosis not present

## 2020-09-03 DIAGNOSIS — S72002D Fracture of unspecified part of neck of left femur, subsequent encounter for closed fracture with routine healing: Secondary | ICD-10-CM | POA: Diagnosis not present

## 2020-09-03 DIAGNOSIS — S0003XD Contusion of scalp, subsequent encounter: Secondary | ICD-10-CM | POA: Diagnosis not present

## 2020-09-03 DIAGNOSIS — I951 Orthostatic hypotension: Secondary | ICD-10-CM | POA: Diagnosis not present

## 2020-09-05 DIAGNOSIS — D649 Anemia, unspecified: Secondary | ICD-10-CM | POA: Diagnosis not present

## 2020-09-05 DIAGNOSIS — I10 Essential (primary) hypertension: Secondary | ICD-10-CM | POA: Diagnosis not present

## 2020-09-05 DIAGNOSIS — R32 Unspecified urinary incontinence: Secondary | ICD-10-CM | POA: Diagnosis not present

## 2020-09-05 DIAGNOSIS — S0003XD Contusion of scalp, subsequent encounter: Secondary | ICD-10-CM | POA: Diagnosis not present

## 2020-09-05 DIAGNOSIS — R41841 Cognitive communication deficit: Secondary | ICD-10-CM | POA: Diagnosis not present

## 2020-09-05 DIAGNOSIS — S2241XD Multiple fractures of ribs, right side, subsequent encounter for fracture with routine healing: Secondary | ICD-10-CM | POA: Diagnosis not present

## 2020-09-05 DIAGNOSIS — S72002D Fracture of unspecified part of neck of left femur, subsequent encounter for closed fracture with routine healing: Secondary | ICD-10-CM | POA: Diagnosis not present

## 2020-09-05 DIAGNOSIS — S0083XD Contusion of other part of head, subsequent encounter: Secondary | ICD-10-CM | POA: Diagnosis not present

## 2020-09-05 DIAGNOSIS — I951 Orthostatic hypotension: Secondary | ICD-10-CM | POA: Diagnosis not present

## 2020-09-10 DIAGNOSIS — I951 Orthostatic hypotension: Secondary | ICD-10-CM | POA: Diagnosis not present

## 2020-09-10 DIAGNOSIS — S0083XD Contusion of other part of head, subsequent encounter: Secondary | ICD-10-CM | POA: Diagnosis not present

## 2020-09-10 DIAGNOSIS — S72002D Fracture of unspecified part of neck of left femur, subsequent encounter for closed fracture with routine healing: Secondary | ICD-10-CM | POA: Diagnosis not present

## 2020-09-10 DIAGNOSIS — D649 Anemia, unspecified: Secondary | ICD-10-CM | POA: Diagnosis not present

## 2020-09-10 DIAGNOSIS — R32 Unspecified urinary incontinence: Secondary | ICD-10-CM | POA: Diagnosis not present

## 2020-09-10 DIAGNOSIS — S0003XD Contusion of scalp, subsequent encounter: Secondary | ICD-10-CM | POA: Diagnosis not present

## 2020-09-10 DIAGNOSIS — I10 Essential (primary) hypertension: Secondary | ICD-10-CM | POA: Diagnosis not present

## 2020-09-10 DIAGNOSIS — S2241XD Multiple fractures of ribs, right side, subsequent encounter for fracture with routine healing: Secondary | ICD-10-CM | POA: Diagnosis not present

## 2020-09-10 DIAGNOSIS — R41841 Cognitive communication deficit: Secondary | ICD-10-CM | POA: Diagnosis not present

## 2020-09-11 DIAGNOSIS — I10 Essential (primary) hypertension: Secondary | ICD-10-CM | POA: Diagnosis not present

## 2020-09-11 DIAGNOSIS — S72002D Fracture of unspecified part of neck of left femur, subsequent encounter for closed fracture with routine healing: Secondary | ICD-10-CM | POA: Diagnosis not present

## 2020-09-11 DIAGNOSIS — S2241XD Multiple fractures of ribs, right side, subsequent encounter for fracture with routine healing: Secondary | ICD-10-CM | POA: Diagnosis not present

## 2020-09-11 DIAGNOSIS — S0083XD Contusion of other part of head, subsequent encounter: Secondary | ICD-10-CM | POA: Diagnosis not present

## 2020-09-11 DIAGNOSIS — S0003XD Contusion of scalp, subsequent encounter: Secondary | ICD-10-CM | POA: Diagnosis not present

## 2020-09-11 DIAGNOSIS — D649 Anemia, unspecified: Secondary | ICD-10-CM | POA: Diagnosis not present

## 2020-09-11 DIAGNOSIS — R32 Unspecified urinary incontinence: Secondary | ICD-10-CM | POA: Diagnosis not present

## 2020-09-11 DIAGNOSIS — I951 Orthostatic hypotension: Secondary | ICD-10-CM | POA: Diagnosis not present

## 2020-09-11 DIAGNOSIS — R41841 Cognitive communication deficit: Secondary | ICD-10-CM | POA: Diagnosis not present

## 2020-09-12 DIAGNOSIS — S2241XD Multiple fractures of ribs, right side, subsequent encounter for fracture with routine healing: Secondary | ICD-10-CM | POA: Diagnosis not present

## 2020-09-12 DIAGNOSIS — R32 Unspecified urinary incontinence: Secondary | ICD-10-CM | POA: Diagnosis not present

## 2020-09-12 DIAGNOSIS — S0083XD Contusion of other part of head, subsequent encounter: Secondary | ICD-10-CM | POA: Diagnosis not present

## 2020-09-12 DIAGNOSIS — R41841 Cognitive communication deficit: Secondary | ICD-10-CM | POA: Diagnosis not present

## 2020-09-12 DIAGNOSIS — S0003XD Contusion of scalp, subsequent encounter: Secondary | ICD-10-CM | POA: Diagnosis not present

## 2020-09-12 DIAGNOSIS — S72002D Fracture of unspecified part of neck of left femur, subsequent encounter for closed fracture with routine healing: Secondary | ICD-10-CM | POA: Diagnosis not present

## 2020-09-12 DIAGNOSIS — I10 Essential (primary) hypertension: Secondary | ICD-10-CM | POA: Diagnosis not present

## 2020-09-12 DIAGNOSIS — D649 Anemia, unspecified: Secondary | ICD-10-CM | POA: Diagnosis not present

## 2020-09-12 DIAGNOSIS — I951 Orthostatic hypotension: Secondary | ICD-10-CM | POA: Diagnosis not present

## 2020-09-16 DIAGNOSIS — K5900 Constipation, unspecified: Secondary | ICD-10-CM | POA: Diagnosis not present

## 2020-09-16 DIAGNOSIS — S2241XD Multiple fractures of ribs, right side, subsequent encounter for fracture with routine healing: Secondary | ICD-10-CM | POA: Diagnosis not present

## 2020-09-16 DIAGNOSIS — S72142D Displaced intertrochanteric fracture of left femur, subsequent encounter for closed fracture with routine healing: Secondary | ICD-10-CM | POA: Diagnosis not present

## 2020-09-16 DIAGNOSIS — I10 Essential (primary) hypertension: Secondary | ICD-10-CM | POA: Diagnosis not present

## 2020-09-16 DIAGNOSIS — D649 Anemia, unspecified: Secondary | ICD-10-CM | POA: Diagnosis not present

## 2020-09-16 DIAGNOSIS — I951 Orthostatic hypotension: Secondary | ICD-10-CM | POA: Diagnosis not present

## 2020-09-16 DIAGNOSIS — R41841 Cognitive communication deficit: Secondary | ICD-10-CM | POA: Diagnosis not present

## 2020-09-16 DIAGNOSIS — R32 Unspecified urinary incontinence: Secondary | ICD-10-CM | POA: Diagnosis not present

## 2020-09-16 DIAGNOSIS — S72002D Fracture of unspecified part of neck of left femur, subsequent encounter for closed fracture with routine healing: Secondary | ICD-10-CM | POA: Diagnosis not present

## 2020-09-18 DIAGNOSIS — I10 Essential (primary) hypertension: Secondary | ICD-10-CM | POA: Diagnosis not present

## 2020-09-18 DIAGNOSIS — K5900 Constipation, unspecified: Secondary | ICD-10-CM | POA: Diagnosis not present

## 2020-09-18 DIAGNOSIS — D649 Anemia, unspecified: Secondary | ICD-10-CM | POA: Diagnosis not present

## 2020-09-18 DIAGNOSIS — S2241XD Multiple fractures of ribs, right side, subsequent encounter for fracture with routine healing: Secondary | ICD-10-CM | POA: Diagnosis not present

## 2020-09-18 DIAGNOSIS — S72002D Fracture of unspecified part of neck of left femur, subsequent encounter for closed fracture with routine healing: Secondary | ICD-10-CM | POA: Diagnosis not present

## 2020-09-18 DIAGNOSIS — I951 Orthostatic hypotension: Secondary | ICD-10-CM | POA: Diagnosis not present

## 2020-09-18 DIAGNOSIS — R41841 Cognitive communication deficit: Secondary | ICD-10-CM | POA: Diagnosis not present

## 2020-09-18 DIAGNOSIS — R32 Unspecified urinary incontinence: Secondary | ICD-10-CM | POA: Diagnosis not present

## 2020-09-24 ENCOUNTER — Other Ambulatory Visit (HOSPITAL_COMMUNITY): Payer: Self-pay | Admitting: Adult Health

## 2020-09-24 DIAGNOSIS — M159 Polyosteoarthritis, unspecified: Secondary | ICD-10-CM | POA: Diagnosis not present

## 2020-09-24 DIAGNOSIS — R600 Localized edema: Secondary | ICD-10-CM | POA: Diagnosis not present

## 2020-09-24 DIAGNOSIS — M79672 Pain in left foot: Secondary | ICD-10-CM | POA: Diagnosis not present

## 2020-09-24 DIAGNOSIS — M79671 Pain in right foot: Secondary | ICD-10-CM | POA: Diagnosis not present

## 2020-09-24 DIAGNOSIS — I739 Peripheral vascular disease, unspecified: Secondary | ICD-10-CM

## 2020-09-25 ENCOUNTER — Encounter (HOSPITAL_COMMUNITY): Payer: Medicare HMO

## 2020-09-26 ENCOUNTER — Ambulatory Visit (HOSPITAL_COMMUNITY)
Admission: RE | Admit: 2020-09-26 | Discharge: 2020-09-26 | Disposition: A | Discharge status: Home / Self Care | Payer: Medicare HMO | Source: Ambulatory Visit | Attending: Hematology & Oncology | Admitting: Hematology & Oncology

## 2020-09-26 ENCOUNTER — Other Ambulatory Visit: Payer: Self-pay

## 2020-09-26 DIAGNOSIS — I739 Peripheral vascular disease, unspecified: Secondary | ICD-10-CM | POA: Diagnosis not present

## 2020-10-03 DIAGNOSIS — D649 Anemia, unspecified: Secondary | ICD-10-CM | POA: Diagnosis not present

## 2020-10-03 DIAGNOSIS — R32 Unspecified urinary incontinence: Secondary | ICD-10-CM | POA: Diagnosis not present

## 2020-10-03 DIAGNOSIS — K5900 Constipation, unspecified: Secondary | ICD-10-CM | POA: Diagnosis not present

## 2020-10-03 DIAGNOSIS — I10 Essential (primary) hypertension: Secondary | ICD-10-CM | POA: Diagnosis not present

## 2020-10-03 DIAGNOSIS — R41841 Cognitive communication deficit: Secondary | ICD-10-CM | POA: Diagnosis not present

## 2020-10-03 DIAGNOSIS — S2241XD Multiple fractures of ribs, right side, subsequent encounter for fracture with routine healing: Secondary | ICD-10-CM | POA: Diagnosis not present

## 2020-10-03 DIAGNOSIS — I951 Orthostatic hypotension: Secondary | ICD-10-CM | POA: Diagnosis not present

## 2020-10-03 DIAGNOSIS — S72002D Fracture of unspecified part of neck of left femur, subsequent encounter for closed fracture with routine healing: Secondary | ICD-10-CM | POA: Diagnosis not present

## 2020-10-07 DIAGNOSIS — K5900 Constipation, unspecified: Secondary | ICD-10-CM | POA: Diagnosis not present

## 2020-10-07 DIAGNOSIS — R32 Unspecified urinary incontinence: Secondary | ICD-10-CM | POA: Diagnosis not present

## 2020-10-07 DIAGNOSIS — I951 Orthostatic hypotension: Secondary | ICD-10-CM | POA: Diagnosis not present

## 2020-10-07 DIAGNOSIS — D649 Anemia, unspecified: Secondary | ICD-10-CM | POA: Diagnosis not present

## 2020-10-07 DIAGNOSIS — S2241XD Multiple fractures of ribs, right side, subsequent encounter for fracture with routine healing: Secondary | ICD-10-CM | POA: Diagnosis not present

## 2020-10-07 DIAGNOSIS — R41841 Cognitive communication deficit: Secondary | ICD-10-CM | POA: Diagnosis not present

## 2020-10-07 DIAGNOSIS — I10 Essential (primary) hypertension: Secondary | ICD-10-CM | POA: Diagnosis not present

## 2020-10-07 DIAGNOSIS — S72002D Fracture of unspecified part of neck of left femur, subsequent encounter for closed fracture with routine healing: Secondary | ICD-10-CM | POA: Diagnosis not present

## 2020-10-13 DIAGNOSIS — D649 Anemia, unspecified: Secondary | ICD-10-CM | POA: Diagnosis not present

## 2020-10-13 DIAGNOSIS — S72002D Fracture of unspecified part of neck of left femur, subsequent encounter for closed fracture with routine healing: Secondary | ICD-10-CM | POA: Diagnosis not present

## 2020-10-13 DIAGNOSIS — K5900 Constipation, unspecified: Secondary | ICD-10-CM | POA: Diagnosis not present

## 2020-10-13 DIAGNOSIS — I10 Essential (primary) hypertension: Secondary | ICD-10-CM | POA: Diagnosis not present

## 2020-10-13 DIAGNOSIS — R41841 Cognitive communication deficit: Secondary | ICD-10-CM | POA: Diagnosis not present

## 2020-10-13 DIAGNOSIS — S2241XD Multiple fractures of ribs, right side, subsequent encounter for fracture with routine healing: Secondary | ICD-10-CM | POA: Diagnosis not present

## 2020-10-13 DIAGNOSIS — I951 Orthostatic hypotension: Secondary | ICD-10-CM | POA: Diagnosis not present

## 2020-10-13 DIAGNOSIS — R32 Unspecified urinary incontinence: Secondary | ICD-10-CM | POA: Diagnosis not present

## 2020-10-16 DIAGNOSIS — D649 Anemia, unspecified: Secondary | ICD-10-CM | POA: Diagnosis not present

## 2020-10-16 DIAGNOSIS — S72002D Fracture of unspecified part of neck of left femur, subsequent encounter for closed fracture with routine healing: Secondary | ICD-10-CM | POA: Diagnosis not present

## 2020-10-16 DIAGNOSIS — R32 Unspecified urinary incontinence: Secondary | ICD-10-CM | POA: Diagnosis not present

## 2020-10-16 DIAGNOSIS — R41841 Cognitive communication deficit: Secondary | ICD-10-CM | POA: Diagnosis not present

## 2020-10-16 DIAGNOSIS — I951 Orthostatic hypotension: Secondary | ICD-10-CM | POA: Diagnosis not present

## 2020-10-16 DIAGNOSIS — S2241XD Multiple fractures of ribs, right side, subsequent encounter for fracture with routine healing: Secondary | ICD-10-CM | POA: Diagnosis not present

## 2020-10-16 DIAGNOSIS — K5900 Constipation, unspecified: Secondary | ICD-10-CM | POA: Diagnosis not present

## 2020-10-16 DIAGNOSIS — I10 Essential (primary) hypertension: Secondary | ICD-10-CM | POA: Diagnosis not present

## 2020-10-22 DIAGNOSIS — I10 Essential (primary) hypertension: Secondary | ICD-10-CM | POA: Diagnosis not present

## 2020-10-22 DIAGNOSIS — S2241XD Multiple fractures of ribs, right side, subsequent encounter for fracture with routine healing: Secondary | ICD-10-CM | POA: Diagnosis not present

## 2020-10-22 DIAGNOSIS — D649 Anemia, unspecified: Secondary | ICD-10-CM | POA: Diagnosis not present

## 2020-10-22 DIAGNOSIS — R41841 Cognitive communication deficit: Secondary | ICD-10-CM | POA: Diagnosis not present

## 2020-10-22 DIAGNOSIS — R32 Unspecified urinary incontinence: Secondary | ICD-10-CM | POA: Diagnosis not present

## 2020-10-22 DIAGNOSIS — I951 Orthostatic hypotension: Secondary | ICD-10-CM | POA: Diagnosis not present

## 2020-10-22 DIAGNOSIS — S72002D Fracture of unspecified part of neck of left femur, subsequent encounter for closed fracture with routine healing: Secondary | ICD-10-CM | POA: Diagnosis not present

## 2020-10-22 DIAGNOSIS — K5900 Constipation, unspecified: Secondary | ICD-10-CM | POA: Diagnosis not present

## 2020-11-04 DIAGNOSIS — R41841 Cognitive communication deficit: Secondary | ICD-10-CM | POA: Diagnosis not present

## 2020-11-04 DIAGNOSIS — S2241XD Multiple fractures of ribs, right side, subsequent encounter for fracture with routine healing: Secondary | ICD-10-CM | POA: Diagnosis not present

## 2020-11-04 DIAGNOSIS — I951 Orthostatic hypotension: Secondary | ICD-10-CM | POA: Diagnosis not present

## 2020-11-04 DIAGNOSIS — K5900 Constipation, unspecified: Secondary | ICD-10-CM | POA: Diagnosis not present

## 2020-11-04 DIAGNOSIS — I10 Essential (primary) hypertension: Secondary | ICD-10-CM | POA: Diagnosis not present

## 2020-11-04 DIAGNOSIS — D649 Anemia, unspecified: Secondary | ICD-10-CM | POA: Diagnosis not present

## 2020-11-04 DIAGNOSIS — R32 Unspecified urinary incontinence: Secondary | ICD-10-CM | POA: Diagnosis not present

## 2020-11-04 DIAGNOSIS — S72002D Fracture of unspecified part of neck of left femur, subsequent encounter for closed fracture with routine healing: Secondary | ICD-10-CM | POA: Diagnosis not present

## 2020-11-13 DIAGNOSIS — S72002D Fracture of unspecified part of neck of left femur, subsequent encounter for closed fracture with routine healing: Secondary | ICD-10-CM | POA: Diagnosis not present

## 2020-11-13 DIAGNOSIS — R32 Unspecified urinary incontinence: Secondary | ICD-10-CM | POA: Diagnosis not present

## 2020-11-13 DIAGNOSIS — I951 Orthostatic hypotension: Secondary | ICD-10-CM | POA: Diagnosis not present

## 2020-11-13 DIAGNOSIS — I10 Essential (primary) hypertension: Secondary | ICD-10-CM | POA: Diagnosis not present

## 2020-11-13 DIAGNOSIS — K5900 Constipation, unspecified: Secondary | ICD-10-CM | POA: Diagnosis not present

## 2020-11-13 DIAGNOSIS — S2241XD Multiple fractures of ribs, right side, subsequent encounter for fracture with routine healing: Secondary | ICD-10-CM | POA: Diagnosis not present

## 2020-11-13 DIAGNOSIS — D649 Anemia, unspecified: Secondary | ICD-10-CM | POA: Diagnosis not present

## 2020-11-13 DIAGNOSIS — R41841 Cognitive communication deficit: Secondary | ICD-10-CM | POA: Diagnosis not present

## 2020-11-18 DIAGNOSIS — S72142D Displaced intertrochanteric fracture of left femur, subsequent encounter for closed fracture with routine healing: Secondary | ICD-10-CM | POA: Diagnosis not present

## 2020-12-10 DIAGNOSIS — H40033 Anatomical narrow angle, bilateral: Secondary | ICD-10-CM | POA: Diagnosis not present

## 2020-12-10 DIAGNOSIS — H401131 Primary open-angle glaucoma, bilateral, mild stage: Secondary | ICD-10-CM | POA: Diagnosis not present

## 2020-12-17 DIAGNOSIS — I83029 Varicose veins of left lower extremity with ulcer of unspecified site: Secondary | ICD-10-CM | POA: Diagnosis not present

## 2020-12-17 DIAGNOSIS — I739 Peripheral vascular disease, unspecified: Secondary | ICD-10-CM | POA: Diagnosis not present

## 2020-12-17 DIAGNOSIS — I83019 Varicose veins of right lower extremity with ulcer of unspecified site: Secondary | ICD-10-CM | POA: Diagnosis not present

## 2020-12-17 DIAGNOSIS — L97919 Non-pressure chronic ulcer of unspecified part of right lower leg with unspecified severity: Secondary | ICD-10-CM | POA: Diagnosis not present

## 2020-12-17 DIAGNOSIS — L97929 Non-pressure chronic ulcer of unspecified part of left lower leg with unspecified severity: Secondary | ICD-10-CM | POA: Diagnosis not present

## 2020-12-24 ENCOUNTER — Encounter: Payer: Self-pay | Admitting: Neurology

## 2020-12-24 ENCOUNTER — Ambulatory Visit: Payer: Medicare HMO | Admitting: Neurology

## 2020-12-24 DIAGNOSIS — S060XAA Concussion with loss of consciousness status unknown, initial encounter: Secondary | ICD-10-CM | POA: Insufficient documentation

## 2020-12-24 DIAGNOSIS — G44309 Post-traumatic headache, unspecified, not intractable: Secondary | ICD-10-CM | POA: Diagnosis not present

## 2020-12-24 DIAGNOSIS — S060XAD Concussion with loss of consciousness status unknown, subsequent encounter: Secondary | ICD-10-CM

## 2020-12-24 NOTE — Patient Instructions (Signed)
Concussion, Adult A concussion is a brain injury from a hard, direct hit (trauma) to the head or body. This direct hit causes the brain to shake quickly back and forth inside the skull. This can damage brain cells and cause chemical changes in the brain. A concussion may also be known as a mild traumatic brain injury (TBI). Concussions are usually not life-threatening, but the effects of a concussion can be serious. If you have a concussion, you should be very careful to avoid having a second concussion. What are the causes? This condition is caused by: A direct hit to your head, such as: Running into another player during a game. Being hit in a fight. Hitting your head on a hard surface. Sudden movement of your body that causes your brain to move back and forth inside the skull, such as in a car crash. What are the signs or symptoms? The signs of a concussion can be hard to notice. Early on, they may be missed by you, family members, and health care providers. You may look fine on the outside but may act or feel differently. Every head injury is different. Symptoms are usually temporary but may last for days, weeks, or even months. Some symptoms appear right away, but other symptoms may not show up for hours or days. If your symptoms last longer than normal, you may have post-concussion syndrome. Physical symptoms Headaches. Dizziness and problems with coordination or balance. Sensitivity to light or noise. Nausea or vomiting. Tiredness (fatigue). Vision or hearing problems. Changes in eating or sleeping patterns. Seizure. Mental and emotional symptoms Irritability or mood changes. Memory problems. Trouble concentrating, organizing, or making decisions. Slowness in thinking, acting or reacting, speaking, or reading. Anxiety or depression. How is this diagnosed? This condition is diagnosed based on: Your symptoms. A description of your injury. You may also have tests,  including: Imaging tests, such as a CT scan or an MRI. Neuropsychological tests. These measure your thinking, understanding, learning, and remembering abilities. How is this treated? Treatment for this condition includes: Stopping sports or activity if you are injured. If you hit your head or show signs of concussion: Do not return to sports or activities the same day. Get checked by a health care provider before you return to your activities. Physical and mental rest and careful observation, usually at home. Gradually return to your normal activities. Medicines to help with symptoms such as headaches, nausea, or difficulty sleeping. Avoid taking opioid pain medicine while recovering from a concussion. Avoiding alcohol and drugs. These may slow your recovery and can put you at risk of further injury. Referral to a concussion clinic or rehabilitation center. Recovery from a concussion can take time. How fast you recover depends on many factors. Return to activities only when: Your symptoms are completely gone. Your health care provider says that it is safe. Follow these instructions at home: Activity Limit activities that require a lot of thought or concentration, such as: Doing homework or job-related work. Watching TV. Working on the computer or phone. Playing memory games and puzzles. Rest. Rest helps your brain heal. Make sure you: Get plenty of sleep. Most adults should get 7-9 hours of sleep each night. Rest during the day. Take naps or rest breaks when you feel tired. Avoid physical activity like exercise until your health care provider says it is safe. Stop any activity that worsens symptoms. Do not do high-risk activities that could cause a second concussion, such as riding a bike or playing sports.  Ask your health care provider when you can return to your normal activities, such as school, work, athletics, and driving. Your ability to react may be slower after a brain injury.  Never do these activities if you are dizzy. Your health care provider will likely give you a plan for gradually returning to activities. General instructions  Take over-the-counter and prescription medicines only as told by your health care provider. Some medicines, such as blood thinners (anticoagulants) and aspirin, may increase the risk for complications, such as bleeding. Do not drink alcohol until your health care provider says you can. Watch your symptoms and tell others around you to do the same. Complications sometimes occur after a concussion. Older adults with a brain injury may have a higher risk of serious complications. Tell your work Freight forwarder, teachers, Government social research officer, school counselor, coach, or Product/process development scientist about your injury, symptoms, and restrictions. Keep all follow-up visits as told by your health care provider. This is important. How is this prevented? Avoiding another brain injury is very important. In rare cases, another injury can lead to permanent brain damage, brain swelling, or death. The risk of this is greatest during the first 7-10 days after a head injury. Avoid injuries by: Stopping activities that could lead to a second concussion, such as contact or recreational sports, until your health care provider says it is okay. Taking these actions once you have returned to sports or activities: Avoiding plays or moves that can cause you to crash into another person. This is how most concussions occur. Following the rules and being respectful of other players. Do not engage in violent or illegal plays. Getting regular exercise that includes strength and balance training. Wearing a properly fitting helmet during sports, biking, or other activities. Helmets can help protect you from serious skull and brain injuries, but they may not protect you from a concussion. Even when wearing a helmet, you should avoid being hit in the head. Contact a health care provider if: Your  symptoms do not improve. You have new symptoms. You have another injury. Get help right away if: You have new or worsening physical symptoms, such as: A severe or worsening headache. Weakness or numbness in any part of your body, slurred speech, vision changes, or confusion. Your coordination gets worse. Vomiting repeatedly. You have a seizure. You have unusual behavior changes. You lose consciousness, are sleepier than normal, or are difficult to wake up. These symptoms may represent a serious problem that is an emergency. Do not wait to see if the symptoms will go away. Get medical help right away. Call your local emergency services (911 in the [place] Do not drive yourself to the hospital. Summary A concussion is a brain injury that results from a hard, direct hit (trauma) to your head or body. You may have imaging tests and neuropsychological tests to diagnose a concussion. Treatment for this condition includes physical and mental rest and careful observation. Ask your health care provider when you can return to your normal activities, such as school, work, athletics, and driving. Get help right away if you have a severe headache, weakness in any part of the body, seizures, behavior changes, changes in vision, or if you are confused or sleepier than normal. This information is not intended to replace advice given to you by your health care provider. Make sure you discuss any questions you have with your health care provider. Document Revised: 05/01/2020 Document Reviewed: 05/01/2020 Elsevier Patient Education  [place].
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
## 2020-12-31 DIAGNOSIS — L97929 Non-pressure chronic ulcer of unspecified part of left lower leg with unspecified severity: Secondary | ICD-10-CM | POA: Diagnosis not present

## 2020-12-31 DIAGNOSIS — L97919 Non-pressure chronic ulcer of unspecified part of right lower leg with unspecified severity: Secondary | ICD-10-CM | POA: Diagnosis not present

## 2020-12-31 DIAGNOSIS — I739 Peripheral vascular disease, unspecified: Secondary | ICD-10-CM | POA: Diagnosis not present

## 2020-12-31 DIAGNOSIS — I83029 Varicose veins of left lower extremity with ulcer of unspecified site: Secondary | ICD-10-CM | POA: Diagnosis not present

## 2020-12-31 DIAGNOSIS — I83019 Varicose veins of right lower extremity with ulcer of unspecified site: Secondary | ICD-10-CM | POA: Diagnosis not present

## 2021-01-07 DIAGNOSIS — M792 Neuralgia and neuritis, unspecified: Secondary | ICD-10-CM | POA: Diagnosis not present

## 2021-01-07 DIAGNOSIS — L97919 Non-pressure chronic ulcer of unspecified part of right lower leg with unspecified severity: Secondary | ICD-10-CM | POA: Diagnosis not present

## 2021-01-07 DIAGNOSIS — I83019 Varicose veins of right lower extremity with ulcer of unspecified site: Secondary | ICD-10-CM | POA: Diagnosis not present

## 2021-01-07 DIAGNOSIS — I739 Peripheral vascular disease, unspecified: Secondary | ICD-10-CM | POA: Diagnosis not present

## 2021-01-19 DIAGNOSIS — L308 Other specified dermatitis: Secondary | ICD-10-CM | POA: Diagnosis not present

## 2021-01-19 DIAGNOSIS — I739 Peripheral vascular disease, unspecified: Secondary | ICD-10-CM | POA: Diagnosis not present

## 2021-01-19 DIAGNOSIS — I872 Venous insufficiency (chronic) (peripheral): Secondary | ICD-10-CM | POA: Diagnosis not present

## 2021-01-19 DIAGNOSIS — R600 Localized edema: Secondary | ICD-10-CM | POA: Diagnosis not present

## 2021-03-03 DIAGNOSIS — K4090 Unilateral inguinal hernia, without obstruction or gangrene, not specified as recurrent: Secondary | ICD-10-CM | POA: Diagnosis not present

## 2021-03-03 DIAGNOSIS — R1031 Right lower quadrant pain: Secondary | ICD-10-CM | POA: Diagnosis not present

## 2021-04-01 DIAGNOSIS — K4090 Unilateral inguinal hernia, without obstruction or gangrene, not specified as recurrent: Secondary | ICD-10-CM | POA: Diagnosis not present

## 2021-04-29 DIAGNOSIS — I83019 Varicose veins of right lower extremity with ulcer of unspecified site: Secondary | ICD-10-CM | POA: Diagnosis not present

## 2021-04-29 DIAGNOSIS — B351 Tinea unguium: Secondary | ICD-10-CM | POA: Diagnosis not present

## 2021-04-29 DIAGNOSIS — M792 Neuralgia and neuritis, unspecified: Secondary | ICD-10-CM | POA: Diagnosis not present

## 2021-04-29 DIAGNOSIS — L603 Nail dystrophy: Secondary | ICD-10-CM | POA: Diagnosis not present

## 2021-04-29 DIAGNOSIS — I739 Peripheral vascular disease, unspecified: Secondary | ICD-10-CM | POA: Diagnosis not present

## 2021-04-29 DIAGNOSIS — L97919 Non-pressure chronic ulcer of unspecified part of right lower leg with unspecified severity: Secondary | ICD-10-CM | POA: Diagnosis not present

## 2021-06-01 DIAGNOSIS — E785 Hyperlipidemia, unspecified: Secondary | ICD-10-CM | POA: Diagnosis not present

## 2021-06-01 DIAGNOSIS — I10 Essential (primary) hypertension: Secondary | ICD-10-CM | POA: Diagnosis not present

## 2021-06-01 DIAGNOSIS — R7301 Impaired fasting glucose: Secondary | ICD-10-CM | POA: Diagnosis not present

## 2021-06-01 DIAGNOSIS — R82998 Other abnormal findings in urine: Secondary | ICD-10-CM | POA: Diagnosis not present

## 2021-06-01 DIAGNOSIS — Z125 Encounter for screening for malignant neoplasm of prostate: Secondary | ICD-10-CM | POA: Diagnosis not present

## 2021-06-08 DIAGNOSIS — M25561 Pain in right knee: Secondary | ICD-10-CM | POA: Diagnosis not present

## 2021-06-08 DIAGNOSIS — R4701 Aphasia: Secondary | ICD-10-CM | POA: Diagnosis not present

## 2021-06-08 DIAGNOSIS — Z0000 Encounter for general adult medical examination without abnormal findings: Secondary | ICD-10-CM | POA: Diagnosis not present

## 2021-06-08 DIAGNOSIS — K4090 Unilateral inguinal hernia, without obstruction or gangrene, not specified as recurrent: Secondary | ICD-10-CM | POA: Diagnosis not present

## 2021-06-08 DIAGNOSIS — M25562 Pain in left knee: Secondary | ICD-10-CM | POA: Diagnosis not present

## 2021-06-08 DIAGNOSIS — Z1331 Encounter for screening for depression: Secondary | ICD-10-CM | POA: Diagnosis not present

## 2021-06-08 DIAGNOSIS — I739 Peripheral vascular disease, unspecified: Secondary | ICD-10-CM | POA: Diagnosis not present

## 2021-06-08 DIAGNOSIS — R7301 Impaired fasting glucose: Secondary | ICD-10-CM | POA: Diagnosis not present

## 2021-06-08 DIAGNOSIS — I10 Essential (primary) hypertension: Secondary | ICD-10-CM | POA: Diagnosis not present

## 2021-06-17 ENCOUNTER — Ambulatory Visit: Payer: Medicare HMO | Attending: Internal Medicine | Admitting: Speech Pathology

## 2021-06-17 DIAGNOSIS — R4701 Aphasia: Secondary | ICD-10-CM | POA: Diagnosis not present
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
## 2021-06-25 ENCOUNTER — Ambulatory Visit: Payer: Medicare HMO | Admitting: Speech Pathology

## 2021-06-25 DIAGNOSIS — R4701 Aphasia: Secondary | ICD-10-CM

## 2021-06-25 NOTE — Therapy (Signed)
?OUTPATIENT SPEECH LANGUAGE PATHOLOGY TREATMENT NOTE ? ? ?Patient Name: Frank Hernandez ?MRN: [id number] ?DOB:[birth year], 81 y.o., male ?Today's Date: 06/25/2021 ? ?PCP: [name], MD ?REFERRING PROVIDER: [name], MD ? ?END OF SESSION:  ? End of Session - 06/25/21 1022   ? ? Visit Number 2   ? Number of Visits 13   ? Date for SLP Re-Evaluation 09/09/21   ? Authorization Type [employer]   ? SLP Start Time 1022   ? SLP Stop Time  1100   ? SLP Time Calculation (min) 38 min   ? Activity Tolerance Patient tolerated treatment well   ? ?  ?  ? ?  ? ? ?Past Medical History:  ?Diagnosis Date  ? Bleeding from the nose   ? Hypertension   ? Pre-diabetes   ? ?Past Surgical History:  ?Procedure Laterality Date  ? COLONOSCOPY    ? INTRAMEDULLARY (IM) NAIL INTERTROCHANTERIC Left 06/11/2020  ? Procedure: INTRAMEDULLARY (IM) NAIL INTERTROCHANTRIC;  Surgeon: Shona Needles, MD;  Location: [facility];  Service: Orthopedics;  Laterality: Left;  ? KNEE ARTHROSCOPY Bilateral   ? POLYPECTOMY    ? ?Patient Active Problem List  ? Diagnosis Date Noted  ? Concussion with unknown loss of consciousness status 12/24/2020  ? Hypokalemia   ? Rib fractures 06/10/2020  ? Severe essential hypertension 11/15/2018  ? Syncope 12/26/2015  ? Pre-diabetes 12/26/2015  ? Hypertension   ? ? ?ONSET DATE: April 2022; referral 06/10/2021  ?  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 ?THERAPY DIAG:  ?Aphasia ? ?SUBJECTIVE: "take what the good lord gave you" ? ?PAIN:  ?Are you having pain? No ? ? ?OBJECTIVE:  ?TODAY'S TREATMENT:  ?06-25-21: Led pt through SFA with verb and noun targets. Explained rationale behind exercise to pt and spouse. For x3 verbs and x1 noun, pt able to generate thorough description according to parameters set forth through exercise with usual mod-A for expanding on details. Occasional instances of vague, non-specific language w/o awareness. ST alerted pt to, able to correct most with min-A. X3 instances of  word finding this date, evidenced by long pause. X3 instances of semantic paraphasia.  ?  ?  ?PATIENT EDUCATION: ?Education details: see above ?Person educated: Patient and Spouse ?Education method: Explanation and Demonstration ?Education comprehension: verbalized understanding, returned demonstration, and needs further education ? ?HOME EXERCISE PROGRAM:  ?SFA ?  ?  ?GOALS: ?Goals reviewed with patient? Yes ?  ?SHORT TERM GOALS: Target date: 07/15/2021 ?  ?Pt will demo successful usage of anomia compensations to make mod complex conversation functional, over 2 sessions  ?Baseline:  ?Goal status: ONGOING ?  ?2.  Pt will perform practical naming tasks with 80% accuracy, occasional min-A over 2 sessions ?Baseline:  ?Goal status: ONGOING ?  ?3.  Pt will complete HEP given occasional min A over 2 sessions ?Baseline:  ?Goal status: ONGOING ?  ?LONG TERM GOALS: Target date: 09/09/2021 ?  ?Pt/caregiver will report improved communication effectiveness via PROM by 2 points at last ST session ?Baseline: CPIB 12 ?Goal status: ONGOING ?  ?2.  Pt will utilize anomia compensations in 15+ minute mod-complex conversation with rare min-A ?Baseline:  ?Goal status: ONGOING ?  ?3.  Pt will complete mod-complex language tasks (e.g. summarization) with rare min-A over 2 sessions ?Baseline:  ?Goal status: ONGOING ?  ?  ?  ?ASSESSMENT: ?  ?CLINICAL IMPRESSION: ?Patient is a 81 y.o. M who was seen today for expressive aphasia resulting from [place] in April 2022.  Pt presents with mild aphasia, notable for vagueness in connected speech, some occurences of overt word finding difficulties, and negative impact on pt's ability to communicate in community. Aphasia reportedly causing pt frustration. Currently using strategy of waiting and coming back to it to repair anomia occurences. I recommend skilled ST to target word finding and caregiver coaching to facilitate home practice.  ?  ?OBJECTIVE IMPAIRMENTS include expressive language and aphasia.  These impairments are limiting patient from effectively communicating at home and in community. ?Factors affecting potential to achieve goals and functional outcome are ability to learn/carryover information. Patient will benefit from skilled SLP services to address above impairments and improve overall function. ?  ?REHAB POTENTIAL: Fair   ?  ?PLAN: ?SLP FREQUENCY: 1x/week ?  ?SLP DURATION: 12 weeks ?  ?PLANNED INTERVENTIONS: Language facilitation, Cueing hierachy, Internal/external aids, Functional tasks, Multimodal communication approach, and SLP instruction and feedback ? ?Su Monks, CCC-SLP ?06/25/2021, 10:23 AM ?  ?

## 2021-07-01 ENCOUNTER — Ambulatory Visit: Payer: Medicare PPO | Attending: Internal Medicine | Admitting: Speech Pathology

## 2021-07-01 DIAGNOSIS — R4701 Aphasia: Secondary | ICD-10-CM | POA: Insufficient documentation

## 2021-07-01 NOTE — Therapy (Signed)
?OUTPATIENT SPEECH LANGUAGE PATHOLOGY TREATMENT NOTE ? ? ?Patient Name: Frank Hernandez ?MRN: [id number] ?DOB:08-25-1940, 81 y.o., male ?Today's Date: 07/01/2021 ? ?PCP: [name], MD ?REFERRING PROVIDER: [name], MD ? ?END OF SESSION:  ? End of Session - 07/01/21 1057   ? ? Visit Number 3   ? Number of Visits 13   ? Date for SLP Re-Evaluation 09/09/21   ? Authorization Type [employer]   ? SLP Start Time 1100   ? SLP Stop Time  1145   ? SLP Time Calculation (min) 45 min   ? Activity Tolerance Patient tolerated treatment well   ? ?  ?  ? ?  ? ? ? ?Past Medical History:  ?Diagnosis Date  ? Bleeding from the nose   ? Hypertension   ? Pre-diabetes   ? ?Past Surgical History:  ?Procedure Laterality Date  ? COLONOSCOPY    ? INTRAMEDULLARY (IM) NAIL INTERTROCHANTERIC Left 06/11/2020  ? Procedure: INTRAMEDULLARY (IM) NAIL INTERTROCHANTRIC;  Surgeon: Shona Needles, MD;  Location: [place];  Service: Orthopedics;  Laterality: Left;  ? KNEE ARTHROSCOPY Bilateral   ? POLYPECTOMY    ? ?Patient Active Problem List  ? Diagnosis Date Noted  ? Concussion with unknown loss of consciousness status 12/24/2020  ? Hypokalemia   ? Rib fractures 06/10/2020  ? Severe essential hypertension 11/15/2018  ? Syncope 12/26/2015  ? Pre-diabetes 12/26/2015  ? Hypertension   ? ? ?ONSET DATE: April 2022; referral 06/10/2021  ?  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 ?THERAPY DIAG:  ?Aphasia ? ?SUBJECTIVE: "pretty good" re: how HEP went ? ?PAIN:  ?Are you having pain? No ? ? ?OBJECTIVE:  ?TODAY'S TREATMENT:  ?07-01-21: Pt report interest in watching the news. When asked, able to relay x3 current news stories. Relay noted to be vague, requiring usual mod-A to add details for clarity. ST led pt through [facility] narrative framework x2, using personal narrative, to target discourse level production with focus on providing appropriate details in organized manner. Following use of framework, pt requires max-A to retell  story using framework. Frequent mazing and long pauses where pt does not progress through story.  ? ?06-25-21: Led pt through SFA with verb and noun targets. Explained rationale behind exercise to pt and spouse. For x3 verbs and x1 noun, pt able to generate thorough description according to parameters set forth through exercise with usual mod-A for expanding on details. Occasional instances of vague, non-specific language w/o awareness. ST alerted pt to, able to correct most with min-A. X3 instances of word finding this date, evidenced by long pause. X3 instances of semantic paraphasia.  ?  ?  ?PATIENT EDUCATION: ?Education details: see above ?Person educated: Patient and Spouse ?Education method: Explanation and Demonstration ?Education comprehension: verbalized understanding, returned demonstration, and needs further education ? ?HOME EXERCISE PROGRAM:  ?[place] for x2 additional personal narratives ?  ?  ?GOALS: ?Goals reviewed with patient? Yes ?  ?SHORT TERM GOALS: Target date: 07/15/2021 ?  ?Pt will demo successful usage of anomia compensations to make mod complex conversation functional, over 2 sessions  ?Baseline:  ?Goal status: ONGOING ?  ?2.  Pt will perform practical naming tasks with 80% accuracy, occasional min-A over 2 sessions ?Baseline:  ?Goal status: ONGOING ?  ?3.  Pt will complete HEP given occasional min A over 2 sessions ?Baseline:  ?Goal status: ONGOING ?  ?LONG TERM GOALS: Target date: 09/09/2021 ?  ?Pt/caregiver will report improved communication effectiveness via PROM by 2  points at last ST session ?Baseline: CPIB 12 ?Goal status: ONGOING ?  ?2.  Pt will utilize anomia compensations in 15+ minute mod-complex conversation with rare min-A ?Baseline:  ?Goal status: ONGOING ?  ?3.  Pt will complete mod-complex language tasks (e.g. summarization) with rare min-A over 2 sessions ?Baseline:  ?Goal status: ONGOING ?  ?  ?  ?ASSESSMENT: ?  ?CLINICAL IMPRESSION: ?Patient is a 81 y.o. M who was seen today  for expressive aphasia resulting from [place] in April 2022. Pt presents with mild aphasia, notable for vagueness in connected speech, some occurences of overt word finding difficulties, and negative impact on pt's ability to communicate in community. Aphasia reportedly causing pt frustration. Currently using strategy of waiting and coming back to it to repair anomia occurences. I recommend skilled ST to target word finding and caregiver coaching to facilitate home practice.  ?  ?OBJECTIVE IMPAIRMENTS include expressive language and aphasia. These impairments are limiting patient from effectively communicating at home and in community. ?Factors affecting potential to achieve goals and functional outcome are ability to learn/carryover information. Patient will benefit from skilled SLP services to address above impairments and improve overall function. ?  ?REHAB POTENTIAL: Fair   ?  ?PLAN: ?SLP FREQUENCY: 1x/week ?  ?SLP DURATION: 12 weeks ?  ?PLANNED INTERVENTIONS: Language facilitation, Cueing hierachy, Internal/external aids, Functional tasks, Multimodal communication approach, and SLP instruction and feedback ? ?Su Monks, CCC-SLP ?07/01/2021, 10:58 AM ?  ?

## 2021-07-08 ENCOUNTER — Ambulatory Visit: Payer: Medicare PPO | Admitting: Speech Pathology

## 2021-07-08 DIAGNOSIS — R4701 Aphasia: Secondary | ICD-10-CM

## 2021-07-08 NOTE — Therapy (Signed)
?OUTPATIENT SPEECH LANGUAGE PATHOLOGY TREATMENT NOTE ? ? ?Patient Name: [name] ?MRN: [id number] ?DOB:1940-10-04, 82 y.o., male ?Today's Date: 07/08/2021 ? ?PCP: Frank Battles, MD ?REFERRING PROVIDER: [name], MD ? ?END OF SESSION:  ? End of Session - 07/08/21 1016   ? ? Visit Number 4   ? Number of Visits 13   ? Date for SLP Re-Evaluation 09/09/21   ? Authorization Type [employer]   ? Progress Note Due on Visit 10   ? SLP Start Time 1016   ? SLP Stop Time  1058   ? SLP Time Calculation (min) 42 min   ? Activity Tolerance Patient tolerated treatment well   ? ?  ?  ? ?  ? ? ? ? ?Past Medical History:  ?Diagnosis Date  ? Bleeding from the nose   ? Hypertension   ? Pre-diabetes   ? ?Past Surgical History:  ?Procedure Laterality Date  ? COLONOSCOPY    ? INTRAMEDULLARY (IM) NAIL INTERTROCHANTERIC Left 06/11/2020  ? Procedure: INTRAMEDULLARY (IM) NAIL INTERTROCHANTRIC;  Surgeon: Shona Needles, MD;  Location: [place];  Service: Orthopedics;  Laterality: Left;  ? KNEE ARTHROSCOPY Bilateral   ? POLYPECTOMY    ? ?Patient Active Problem List  ? Diagnosis Date Noted  ? Concussion with unknown loss of consciousness status 12/24/2020  ? Hypokalemia   ? Rib fractures 06/10/2020  ? Severe essential hypertension 11/15/2018  ? Syncope 12/26/2015  ? Pre-diabetes 12/26/2015  ? Hypertension   ? ? ?ONSET DATE: April 2022; referral 06/10/2021  ?  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 ?THERAPY DIAG:  ?Aphasia ? ?SUBJECTIVE: "here's my homework" ? ?PAIN:  ?Are you having pain? No ? ? ?OBJECTIVE:  ?TODAY'S TREATMENT:  ?07-08-21: Reviewed NARNIA story generation in which pt demonstrates succinct yet complete telling of x1 news story and x1 personal story. Reports ease during completion. Verb Administrator, arts (VNeST) was administered  to target improved word-finding at the sentence and discourse level. Target verbs today included reading, photographing. Pt was able to generate x3 whos  and whats for all target verbs mod-I. Mod-A for answering wh- questions following. Max-A for cohesive verbal expression of information following activity. Telegraphic speech during retell, even with direct model.  ? ?07-01-21: Pt report interest in watching the news. When asked, able to relay x3 current news stories. Relay noted to be vague, requiring usual mod-A to add details for clarity. ST led pt through [facility] narrative framework x2, using personal narrative, to target discourse level production with focus on providing appropriate details in organized manner. Following use of framework, pt requires max-A to retell story using framework. Frequent mazing and long pauses where pt does not progress through story.  ? ?06-25-21: Led pt through SFA with verb and noun targets. Explained rationale behind exercise to pt and spouse. For x3 verbs and x1 noun, pt able to generate thorough description according to parameters set forth through exercise with usual mod-A for expanding on details. Occasional instances of vague, non-specific language w/o awareness. ST alerted pt to, able to correct most with min-A. X3 instances of word finding this date, evidenced by long pause. X3 instances of semantic paraphasia.  ?  ?  ?PATIENT EDUCATION: ?Education details: see above ?Person educated: Patient and Spouse ?Education method: Explanation and Demonstration ?Education comprehension: verbalized understanding, returned demonstration, and needs further education ? ?HOME EXERCISE PROGRAM:  ?[place] for x2 additional personal narratives ?  ?  ?GOALS: ?Goals reviewed with patient? Yes ?  ?  SHORT TERM GOALS: Target date: 07/15/2021 ?  ?Pt will demo successful usage of anomia compensations to make mod complex conversation functional, over 2 sessions  ?Baseline:  ?Goal status: ONGOING ?  ?2.  Pt will perform practical naming tasks with 80% accuracy, occasional min-A over 2 sessions ?Baseline: 07/08/21 ?Goal status: ONGOING ?  ?3.  Pt will complete  HEP given occasional min A over 2 sessions ?Baseline: 07/08/21 ?Goal status: ONGOING ?  ?LONG TERM GOALS: Target date: 09/09/2021 ?  ?Pt/caregiver will report improved communication effectiveness via PROM by 2 points at last ST session ?Baseline: CPIB 12 ?Goal status: ONGOING ?  ?2.  Pt will utilize anomia compensations in 15+ minute mod-complex conversation with rare min-A ?Baseline:  ?Goal status: ONGOING ?  ?3.  Pt will complete mod-complex language tasks (e.g. summarization) with rare min-A over 2 sessions ?Baseline:  ?Goal status: ONGOING ?  ?  ?  ?ASSESSMENT: ?  ?CLINICAL IMPRESSION: ?Patient is a 81 y.o. M who was seen today for expressive aphasia resulting from [place] in April 2022. Pt presents with mild aphasia, notable for vagueness in connected speech, some occurences of overt word finding difficulties, and negative impact on pt's ability to communicate in community. Aphasia reportedly causing pt frustration. Currently using strategy of waiting and coming back to it to repair anomia occurences. I recommend skilled ST to target word finding and caregiver coaching to facilitate home practice.  ?  ?OBJECTIVE IMPAIRMENTS include expressive language and aphasia. These impairments are limiting patient from effectively communicating at home and in community. ?Factors affecting potential to achieve goals and functional outcome are ability to learn/carryover information. Patient will benefit from skilled SLP services to address above impairments and improve overall function. ?  ?REHAB POTENTIAL: Fair   ?  ?PLAN: ?SLP FREQUENCY: 1x/week ?  ?SLP DURATION: 12 weeks ?  ?PLANNED INTERVENTIONS: Language facilitation, Cueing hierachy, Internal/external aids, Functional tasks, Multimodal communication approach, and SLP instruction and feedback ? ?Su Monks, CCC-SLP ?07/08/2021, 10:17 AM ?  ?

## 2021-07-15 ENCOUNTER — Ambulatory Visit: Payer: Medicare PPO

## 2021-07-15 DIAGNOSIS — R4701 Aphasia: Secondary | ICD-10-CM | POA: Diagnosis not present

## 2021-07-15 NOTE — Therapy (Signed)
?OUTPATIENT SPEECH LANGUAGE PATHOLOGY TREATMENT NOTE ? ? ?Patient Name: Frank Hernandez ?MRN: [id number] ?DOB:1940/11/08, 81 y.o., male ?Today's Date: 07/15/2021 ? ?PCP: [name], MD ?REFERRING PROVIDER: [name], MD ? ?END OF SESSION:  ? End of Session - 07/15/21 1140   ? ? Visit Number 5   ? Number of Visits 13   ? Date for SLP Re-Evaluation 09/09/21   ? Authorization Type [employer]   ? SLP Start Time 1145   ? SLP Stop Time  1230   ? SLP Time Calculation (min) 45 min   ? Activity Tolerance Patient tolerated treatment well   ? ?  ?  ? ?  ? ? ? ? ?Past Medical History:  ?Diagnosis Date  ? Bleeding from the nose   ? Hypertension   ? Pre-diabetes   ? ?Past Surgical History:  ?Procedure Laterality Date  ? COLONOSCOPY    ? INTRAMEDULLARY (IM) NAIL INTERTROCHANTERIC Left 06/11/2020  ? Procedure: INTRAMEDULLARY (IM) NAIL INTERTROCHANTRIC;  Surgeon: Shona Needles, MD;  Location: [place];  Service: Orthopedics;  Laterality: Left;  ? KNEE ARTHROSCOPY Bilateral   ? POLYPECTOMY    ? ?Patient Active Problem List  ? Diagnosis Date Noted  ? Concussion with unknown loss of consciousness status 12/24/2020  ? Hypokalemia   ? Rib fractures 06/10/2020  ? Severe essential hypertension 11/15/2018  ? Syncope 12/26/2015  ? Pre-diabetes 12/26/2015  ? Hypertension   ? ? ?ONSET DATE: April 2022; referral 06/10/2021  ?  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 ?THERAPY DIAG: Aphasia ? ?SUBJECTIVE: "getting better" ? ?PAIN:  ?Are you having pain? No ? ? 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 Usual prompting required to aid error awareness and occasional min to mod A needed for error correction. Max prompting required for patient to tell new therapist about himself. Wife provided intermittent appropriate cues for patient to expand his utterances for improved listener  comprehension. SLP had patient name 15 car parts and generate sentence for each part with intermittent min A required to optimize syntax and word retrieval to better understand patient. Inconsistent awareness of errors noted. HEP provided for fill in blank of sentences and read aloud with wife providing assistance to cue patient if sentences don't make sense.  ? ?07-08-21: Reviewed NARNIA story generation in which pt demonstrates succinct yet complete telling of x1 news story and x1 personal story. Reports ease during completion. Verb Administrator, arts (VNeST) was administered  to target improved word-finding at the sentence and discourse level. Target verbs today included reading, photographing. Pt was able to generate x3 whos and whats for all target verbs mod-I. Mod-A for answering wh- questions following. Max-A for cohesive verbal expression of information following activity. Telegraphic speech during retell, even with direct model.  ? ?07-01-21: Pt report interest in watching the news. When asked, able to relay x3 current news stories. Relay noted to be vague, requiring usual mod-A to add details for clarity. ST led pt through [facility] narrative framework x2, using personal narrative, to target discourse level production with focus on providing appropriate details in organized manner. Following use of framework, pt requires max-A to retell story using framework. Frequent mazing and long pauses where pt does not progress through story.  ? ?06-25-21: Led pt through SFA with verb and noun targets. Explained rationale behind exercise to pt and spouse. For x3 verbs  and x1 noun, pt able to generate thorough description according to parameters set forth through exercise with usual mod-A for expanding on details. Occasional instances of vague, non-specific language w/o awareness. ST alerted pt to, able to correct most with min-A. X3 instances of word finding this date, evidenced by long pause. X3 instances of  semantic paraphasia.  ?  ?  ?PATIENT EDUCATION: ?Education details: see above ?Person educated: Patient and Spouse ?Education method: Explanation and Demonstration ?Education comprehension: verbalized understanding, returned demonstration, and needs further education ?  ?  ?GOALS: ?Goals reviewed with patient? Yes ?  ?SHORT TERM GOALS: Target date: 07/15/2021 ?  ?Pt will demo successful usage of anomia compensations to make mod complex conversation functional, over 2 sessions  ?Baseline:  ?Goal status: Not Met ?  ?2.  Pt will perform practical naming tasks with 80% accuracy, occasional min-A over 2 sessions ?Baseline: 07/08/21, 07/15/21 ?Goal status: Met ?  ?3.  Pt will complete HEP given occasional min A over 2 sessions ?Baseline: 07/08/21, 07/15/21 ?Goal status: Met ?  ?LONG TERM GOALS: Target date: 09/09/2021 ?  ?Pt/caregiver will report improved communication effectiveness via PROM by 2 points at last ST session ?Baseline: CPIB 12 ?Goal status: ONGOING ?  ?2.  Pt will utilize anomia compensations in 15+ minute mod-complex conversation with rare min-A ?Baseline:  ?Goal status: ONGOING ?  ?3.  Pt will complete mod-complex language tasks (e.g. summarization) with rare min-A over 2 sessions ?Baseline:  ?Goal status: ONGOING ?  ?  ?  ?ASSESSMENT: ?  ?CLINICAL IMPRESSION: ?Patient is a 81 y.o. M who was seen today for expressive aphasia resulting from [place] in April 2022. Pt presents with mild aphasia, notable for vagueness in connected speech, some occurences of overt word finding difficulties, and negative impact on pt's ability to communicate in community. Conducted ongoing education and instruction of anomia compensations and targeted techniques to maximize verbal expression. I recommend skilled ST to target word finding and caregiver coaching to facilitate home practice.  ?  ?OBJECTIVE IMPAIRMENTS include expressive language and aphasia. These impairments are limiting patient from effectively communicating at home and in  community. Factors affecting potential to achieve goals and functional outcome are ability to learn/carryover information. Patient will benefit from skilled SLP services to address above impairments and improve overall function. ?  ?REHAB POTENTIAL: Fair   ?  ?PLAN: ?SLP FREQUENCY: 1x/week ?  ?SLP DURATION: 12 weeks ?  ?PLANNED INTERVENTIONS: Language facilitation, Cueing hierachy, Internal/external aids, Functional tasks, Multimodal communication approach, and SLP instruction and feedback ? ?Marzetta Board, CCC-SLP ?07/15/2021, 11:41 AM ?  ?

## 2021-07-22 ENCOUNTER — Ambulatory Visit: Payer: Medicare PPO | Admitting: Speech Pathology

## 2021-07-22 DIAGNOSIS — R4701 Aphasia: Secondary | ICD-10-CM | POA: Diagnosis not present

## 2021-07-22 NOTE — Therapy (Signed)
OUTPATIENT SPEECH LANGUAGE PATHOLOGY TREATMENT NOTE   Patient Name: Frank Hernandez MRN: [id number] DOB:11/08/1940, 81 y.o., male Today's Date: 07/22/2021  PCP: [name], MD REFERRING PROVIDER: [name], MD  END OF SESSION:   End of Session - 07/22/21 1053     Visit Number 6    Number of Visits 13    Date for SLP Re-Evaluation 09/09/21    Authorization Type [employer]    SLP Start Time 1100    SLP Stop Time  1145    SLP Time Calculation (min) 45 min    Activity Tolerance Patient tolerated treatment well                Past Medical History:  Diagnosis Date   Bleeding from the nose    Hypertension    Pre-diabetes    Past Surgical History:  Procedure Laterality Date   COLONOSCOPY     INTRAMEDULLARY (IM) NAIL INTERTROCHANTERIC Left 06/11/2020   Procedure: INTRAMEDULLARY (IM) NAIL INTERTROCHANTRIC;  Surgeon: Shona Needles, MD;  Location: [place];  Service: Orthopedics;  Laterality: Left;   KNEE ARTHROSCOPY Bilateral    POLYPECTOMY     Patient Active Problem List   Diagnosis Date Noted   Concussion with unknown loss of consciousness status 12/24/2020   Hypokalemia    Rib fractures 06/10/2020   Severe essential hypertension 11/15/2018   Syncope 12/26/2015   Pre-diabetes 12/26/2015   Hypertension     ONSET DATE: April 2022; referral 06/10/2021    REFERRING DIAG: 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 No   OBJECTIVE:  TODAY'S TREATMENT:  07-22-21: Pt tells ST his communication is "getting better." Tells ST he is better able "to get my words out and finish my sentences." Target naming through listing supplies needed for typical tasks completed at home. For bill pay originally lists x2. Given max-A, able to add additional x7 items to list. Originally generated list of thoughts, not things, appearing to misunderstand the task. Target higher level  communication skills thorough sequencing task of paying [facility] bill through the mail. Requires max-A, faded to min-A for accuracy, sequencing, organization, and completeness. Limited awareness of errors, requires max-A to correct. Undated HEP for task mimicking targets addressed in therapy.   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 Usual prompting required to aid error awareness and occasional min to mod A needed for error correction. Max prompting required for patient to tell new therapist about himself. Wife provided intermittent appropriate cues for patient to expand his utterances for improved listener comprehension. SLP had patient name 15 car parts and generate sentence for each part with intermittent min A required to optimize syntax and word retrieval to better understand patient. Inconsistent awareness of errors noted. HEP provided for fill in blank of sentences and read aloud with wife providing assistance to cue patient if sentences don't make sense.     PATIENT EDUCATION: Education details: see above Person educated: Patient and Spouse Education method: Customer service manager Education comprehension: verbalized understanding, returned demonstration, and needs further education     GOALS: Goals reviewed with patient? Yes   SHORT TERM GOALS: Target date: 07/15/2021   Pt will demo successful usage of anomia compensations to make mod complex conversation functional, over 2 sessions  Baseline:  Goal status: Not Met   2.  Pt will perform  practical naming tasks with 80% accuracy, occasional min-A over 2 sessions Baseline: 07/08/21, 07/15/21 Goal status: Met   3.  Pt will complete HEP given occasional min A over 2 sessions Baseline: 07/08/21, 07/15/21 Goal status: Met   LONG TERM GOALS: Target date: 09/09/2021   Pt/caregiver will report improved communication effectiveness via PROM by 2  points at last ST session Baseline: CPIB 12 Goal status: ONGOING   2.  Pt will utilize anomia compensations in 15+ minute mod-complex conversation with rare min-A Baseline:  Goal status: ONGOING   3.  Pt will complete mod-complex language tasks (e.g. summarization) with rare min-A over 2 sessions Baseline:  Goal status: ONGOING       ASSESSMENT:   CLINICAL IMPRESSION: Patient is a 81 y.o. M who was seen today for expressive aphasia resulting from [place] in April 2022. Pt presents with mild aphasia, notable for vagueness in connected speech, some occurences of overt word finding difficulties, and negative impact on pt's ability to communicate in community. Conducted ongoing education and instruction of anomia compensations and targeted techniques to maximize verbal expression. I recommend skilled ST to target word finding and caregiver coaching to facilitate home practice.    OBJECTIVE IMPAIRMENTS include expressive language and aphasia. These impairments are limiting patient from effectively communicating at home and in community. Factors affecting potential to achieve goals and functional outcome are ability to learn/carryover information. Patient will benefit from skilled SLP services to address above impairments and improve overall function.   REHAB POTENTIAL: Fair     PLAN: SLP FREQUENCY: 1x/week   SLP DURATION: 12 weeks   PLANNED INTERVENTIONS: Language facilitation, Cueing hierachy, Internal/external aids, Functional tasks, Multimodal communication approach, and SLP instruction and feedback  Su Monks, [place] 07/22/2021, 10:55 AM

## 2021-07-22 NOTE — Patient Instructions (Signed)
Buying a Music therapist or things involved:          Steps in buying a car:        [place]'s advise to young person buying a car:       2.      Rebuild a car motor  Supplies or things you need:        Steps to complete:         Advise on doing this:      2.         After writing everything down, read it out loud. Do you think your listener would know what you're saying?

## 2021-07-29 ENCOUNTER — Ambulatory Visit: Payer: Medicare PPO | Admitting: Speech Pathology

## 2021-07-29 DIAGNOSIS — R4701 Aphasia: Secondary | ICD-10-CM

## 2021-07-29 NOTE — Therapy (Signed)
OUTPATIENT SPEECH LANGUAGE PATHOLOGY TREATMENT NOTE   Patient Name: Frank Hernandez MRN: [id number] DOB:03/18/1940, 81 y.o., male Today's Date: 07/29/2021  PCP: [name], MD REFERRING PROVIDER: [name], MD  END OF SESSION:   End of Session - 07/29/21 1058     Visit Number 7    Number of Visits 13    Date for SLP Re-Evaluation 09/09/21    Authorization Type [employer]    SLP Start Time 1100    SLP Stop Time  1145    SLP Time Calculation (min) 45 min    Activity Tolerance Patient tolerated treatment well                 Past Medical History:  Diagnosis Date   Bleeding from the nose    Hypertension    Pre-diabetes    Past Surgical History:  Procedure Laterality Date   COLONOSCOPY     INTRAMEDULLARY (IM) NAIL INTERTROCHANTERIC Left 06/11/2020   Procedure: INTRAMEDULLARY (IM) NAIL INTERTROCHANTRIC;  Surgeon: Shona Needles, MD;  Location: [facility];  Service: Orthopedics;  Laterality: Left;   KNEE ARTHROSCOPY Bilateral    POLYPECTOMY     Patient Active Problem List   Diagnosis Date Noted   Concussion with unknown loss of consciousness status 12/24/2020   Hypokalemia    Rib fractures 06/10/2020   Severe essential hypertension 11/15/2018   Syncope 12/26/2015   Pre-diabetes 12/26/2015   Hypertension     ONSET DATE: April 2022; referral 06/10/2021    REFERRING DIAG: 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 No   OBJECTIVE:  TODAY'S TREATMENT:  07-29-21: Pt returns with HEP completed. Notable for routine syntax errors, limited awareness even with read back. Vague response in describing steps of tasks. Able to fill in some details given questioning cues from [place]. Target verbal and written language production through hierarchical task related to common theme. First pt lists items related to theme, then sequences steps in completion, then responds to  conversational questions about topic. Requires usual max-A to understand targeted task. Usual spelling and syntax errors exhibited with written production.  Weekly $ collection: Usual max-A for generation of objects related to task, generates x11 (naming); Rare mod-A for generating logical sequence of x10 events (procedural discourse), IND in answering x3 hypothetical questions related to topic, demonstrating adequate problem solving.  Previous work position: Usual max-A for generation of objects related to task, generates x13 (naming); Mod-I for generating logical sequence of x7 events (procedural discourse), IND in answering 3/3 hypothetical questions related to topic, demonstrating adequate problem solving.   07-22-21: Pt tells ST his communication is "getting better." Tells ST he is better able "to get my words out and finish my sentences." Target naming through listing supplies needed for typical tasks completed at home. For bill pay originally lists x2. Given max-A, able to add additional x7 items to list. Originally generated list of thoughts, not things, appearing to misunderstand the task. Target higher level communication skills thorough sequencing task of paying [facility] bill through the mail. Requires max-A, faded to min-A for accuracy, sequencing, organization, and completeness. Limited awareness of errors, requires max-A to correct. Undated HEP for task mimicking targets addressed in therapy.   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 Usual prompting required to aid error awareness and occasional min to mod A  needed for error correction. Max prompting required for patient to tell new therapist about himself. Wife provided intermittent appropriate cues for patient to expand his utterances for improved listener comprehension. SLP had patient name 15 car parts and generate sentence for each part with intermittent  min A required to optimize syntax and word retrieval to better understand patient. Inconsistent awareness of errors noted. HEP provided for fill in blank of sentences and read aloud with wife providing assistance to cue patient if sentences don't make sense.     PATIENT EDUCATION: Education details: see above Person educated: Patient and Spouse Education method: Customer service manager Education comprehension: verbalized understanding, returned demonstration, and needs further education     GOALS: Goals reviewed with patient? Yes   SHORT TERM GOALS: Target date: 07/15/2021   Pt will demo successful usage of anomia compensations to make mod complex conversation functional, over 2 sessions  Baseline:  Goal status: Not Met   2.  Pt will perform practical naming tasks with 80% accuracy, occasional min-A over 2 sessions Baseline: 07/08/21, 07/15/21 Goal status: Met   3.  Pt will complete HEP given occasional min A over 2 sessions Baseline: 07/08/21, 07/15/21 Goal status: Met   LONG TERM GOALS: Target date: 09/09/2021   Pt/caregiver will report improved communication effectiveness via PROM by 2 points at last ST session Baseline: CPIB 12 Goal status: ONGOING   2.  Pt will utilize anomia compensations in 15+ minute mod-complex conversation with rare min-A Baseline:  Goal status: ONGOING   3.  Pt will complete mod-complex language tasks (e.g. summarization) with rare min-A over 2 sessions Baseline:  Goal status: ONGOING       ASSESSMENT:   CLINICAL IMPRESSION: Patient is a 81 y.o. M who was seen today for expressive aphasia resulting from [place] in April 2022. Pt presents with mild aphasia, notable for vagueness in connected speech, some occurences of overt word finding difficulties, and negative impact on pt's ability to communicate in community. Ongoing education and instruction of anomia compensations and targeted techniques to maximize verbal expression. Also demonstrating some  comprehension deficits in structured tasks, frequent spelling and syntax errors with limited awareness, even with max-A. Limited theory of mind demonstrated during conversational speech leading to communication breakdowns. I recommend skilled ST to target word finding and caregiver coaching to facilitate home practice.    OBJECTIVE IMPAIRMENTS include expressive language and aphasia. These impairments are limiting patient from effectively communicating at home and in community. Factors affecting potential to achieve goals and functional outcome are ability to learn/carryover information. Patient will benefit from skilled SLP services to address above impairments and improve overall function.   REHAB POTENTIAL: Fair     PLAN: SLP FREQUENCY: 1x/week   SLP DURATION: 12 weeks   PLANNED INTERVENTIONS: Language facilitation, Cueing hierachy, Internal/external aids, Functional tasks, Multimodal communication approach, and SLP instruction and feedback  Su Monks, [place] 07/29/2021, 10:59 AM

## 2021-07-31 DIAGNOSIS — B351 Tinea unguium: Secondary | ICD-10-CM | POA: Diagnosis not present

## 2021-07-31 DIAGNOSIS — L97919 Non-pressure chronic ulcer of unspecified part of right lower leg with unspecified severity: Secondary | ICD-10-CM | POA: Diagnosis not present

## 2021-07-31 DIAGNOSIS — I739 Peripheral vascular disease, unspecified: Secondary | ICD-10-CM | POA: Diagnosis not present

## 2021-07-31 DIAGNOSIS — I83019 Varicose veins of right lower extremity with ulcer of unspecified site: Secondary | ICD-10-CM | POA: Diagnosis not present

## 2021-07-31 DIAGNOSIS — M792 Neuralgia and neuritis, unspecified: Secondary | ICD-10-CM | POA: Diagnosis not present

## 2021-07-31 DIAGNOSIS — L603 Nail dystrophy: Secondary | ICD-10-CM | POA: Diagnosis not present

## 2021-08-05 ENCOUNTER — Ambulatory Visit: Payer: Medicare PPO | Attending: Internal Medicine | Admitting: Speech Pathology

## 2021-08-05 ENCOUNTER — Encounter: Payer: Self-pay | Admitting: Speech Pathology

## 2021-08-05 DIAGNOSIS — R4701 Aphasia: Secondary | ICD-10-CM | POA: Insufficient documentation

## 2021-08-05 NOTE — Therapy (Signed)
OUTPATIENT SPEECH LANGUAGE PATHOLOGY TREATMENT NOTE   Patient Name: Frank Hernandez MRN: [id number] DOB:May 25, 1940, 81 y.o., male Today's Date: 08/05/2021  PCP: Frank Battles, MD REFERRING PROVIDER: [name], MD  END OF SESSION:   End of Session - 08/05/21 1201     Visit Number 8    Date for SLP Re-Evaluation 09/09/21    Authorization Type [employer]    SLP Start Time 1100    SLP Stop Time  1145    SLP Time Calculation (min) 45 min    Activity Tolerance Patient tolerated treatment well                  Past Medical History:  Diagnosis Date   Bleeding from the nose    Hypertension    Pre-diabetes    Past Surgical History:  Procedure Laterality Date   COLONOSCOPY     INTRAMEDULLARY (IM) NAIL INTERTROCHANTERIC Left 06/11/2020   Procedure: INTRAMEDULLARY (IM) NAIL INTERTROCHANTRIC;  Surgeon: Shona Needles, MD;  Location: [facility];  Service: Orthopedics;  Laterality: Left;   KNEE ARTHROSCOPY Bilateral    POLYPECTOMY     Patient Active Problem List   Diagnosis Date Noted   Concussion with unknown loss of consciousness status 12/24/2020   Hypokalemia    Rib fractures 06/10/2020   Severe essential hypertension 11/15/2018   Syncope 12/26/2015   Pre-diabetes 12/26/2015   Hypertension     ONSET DATE: April 2022; referral 06/10/2021    REFERRING DIAG: 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 Yes, 8/10, both knees Takes tylenol   OBJECTIVE:  TODAY'S TREATMENT:   08-05-21: targeted sentence generation in structured task and conversation. Frank Hernandez required frequent mod verbal and questioning cues to add details and increase utterance length to 5+ words. Targeted verbal and gesture compensations for aphasia in structured task with frequent mod questioning cues. In conversation he also required questioning cues to add salient details and clarify vague utterances. Provided  verbal HW, spouse to complete with him.  07-29-21: Pt returns with HEP completed. Notable for routine syntax errors, limited awareness even with read back. Vague response in describing steps of tasks. Able to fill in some details given questioning cues from [place]. Target verbal and written language production through hierarchical task related to common theme. First pt lists items related to theme, then sequences steps in completion, then responds to conversational questions about topic. Requires usual max-A to understand targeted task. Usual spelling and syntax errors exhibited with written production.  Weekly $ collection: Usual max-A for generation of objects related to task, generates x11 (naming); Rare mod-A for generating logical sequence of x10 events (procedural discourse), IND in answering x3 hypothetical questions related to topic, demonstrating adequate problem solving.  Previous work position: Usual max-A for generation of objects related to task, generates x13 (naming); Mod-I for generating logical sequence of x7 events (procedural discourse), IND in answering 3/3 hypothetical questions related to topic, demonstrating adequate problem solving.   07-22-21: Pt tells ST his communication is "getting better." Tells ST he is better able "to get my words out and finish my sentences." Target naming through listing supplies needed for typical tasks completed at home. For bill pay originally lists x2. Given max-A, able to add additional x7 items to list. Originally generated list of thoughts, not things, appearing to misunderstand the task. Target higher level communication skills thorough sequencing task of paying [facility] bill through the mail.  Requires max-A, faded to min-A for accuracy, sequencing, organization, and completeness. Limited awareness of errors, requires max-A to correct. Undated HEP for task mimicking targets addressed in therapy.   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 Usual prompting required to aid error awareness and occasional min to mod A needed for error correction. Max prompting required for patient to tell new therapist about himself. Wife provided intermittent appropriate cues for patient to expand his utterances for improved listener comprehension. SLP had patient name 15 car parts and generate sentence for each part with intermittent min A required to optimize syntax and word retrieval to better understand patient. Inconsistent awareness of errors noted. HEP provided for fill in blank of sentences and read aloud with wife providing assistance to cue patient if sentences don't make sense.     PATIENT EDUCATION: Education details: see above Person educated: Patient and Spouse Education method: Customer service manager Education comprehension: verbalized understanding, returned demonstration, and needs further education     GOALS: Goals reviewed with patient? Yes   SHORT TERM GOALS: Target date: 07/15/2021   Pt will demo successful usage of anomia compensations to make mod complex conversation functional, over 2 sessions  Baseline:  Goal status: Not Met   2.  Pt will perform practical naming tasks with 80% accuracy, occasional min-A over 2 sessions Baseline: 07/08/21, 07/15/21 Goal status: Met   3.  Pt will complete HEP given occasional min A over 2 sessions Baseline: 07/08/21, 07/15/21 Goal status: Met   LONG TERM GOALS: Target date: 09/09/2021   Pt/caregiver will report improved communication effectiveness via PROM by 2 points at last ST session Baseline: CPIB 12 Goal status: ONGOING   2.  Pt will utilize anomia compensations in 15+ minute mod-complex conversation with rare min-A Baseline:  Goal status: ONGOING   3.  Pt will complete mod-complex language tasks (e.g. summarization) with rare min-A over 2 sessions Baseline:  Goal status: ONGOING        ASSESSMENT:   CLINICAL IMPRESSION: Patient is a 81 y.o. M who was seen today for expressive aphasia resulting from [place] in April 2022. Pt presents with mild aphasia, notable for vagueness in connected speech, some occurences of overt word finding difficulties, and negative impact on pt's ability to communicate in community. Ongoing education and instruction of anomia compensations and targeted techniques to maximize verbal expression. Also demonstrating some comprehension deficits in structured tasks, frequent spelling and syntax errors with limited awareness, even with max-A. Limited theory of mind demonstrated during conversational speech leading to communication breakdowns. I recommend skilled ST to target word finding and caregiver coaching to facilitate home practice.    OBJECTIVE IMPAIRMENTS include expressive language and aphasia. These impairments are limiting patient from effectively communicating at home and in community. Factors affecting potential to achieve goals and functional outcome are ability to learn/carryover information. Patient will benefit from skilled SLP services to address above impairments and improve overall function.   REHAB POTENTIAL: Fair     PLAN: SLP FREQUENCY: 1x/week   SLP DURATION: 12 weeks   PLANNED INTERVENTIONS: Language facilitation, Cueing hierachy, Internal/external aids, Functional tasks, Multimodal communication approach, and SLP instruction and feedback  [place], Annye Rusk, [place] 08/05/2021, 12:02 PM

## 2021-08-12 ENCOUNTER — Ambulatory Visit: Payer: Medicare PPO | Admitting: Speech Pathology

## 2021-08-12 DIAGNOSIS — R4701 Aphasia: Secondary | ICD-10-CM | POA: Diagnosis not present

## 2021-08-12 NOTE — Therapy (Signed)
OUTPATIENT SPEECH LANGUAGE PATHOLOGY TREATMENT & DISCHARGE NOTE   Patient Name: Frank Hernandez MRN: [id number] DOB:[birth year], 81 y.o., male Today's Date: 08/12/2021  PCP: [name], MD REFERRING PROVIDER: [name], MD  END OF SESSION:   End of Session - 08/12/21 1100     Visit Number 9    Number of Visits 13    Date for SLP Re-Evaluation 09/09/21    Authorization Type [employer]    Progress Note Due on Visit 10    SLP Start Time 1100    SLP Stop Time  1145    SLP Time Calculation (min) 45 min    Activity Tolerance Patient tolerated treatment well                   Past Medical History:  Diagnosis Date   Bleeding from the nose    Hypertension    Pre-diabetes    Past Surgical History:  Procedure Laterality Date   COLONOSCOPY     INTRAMEDULLARY (IM) NAIL INTERTROCHANTERIC Left 06/11/2020   Procedure: INTRAMEDULLARY (IM) NAIL INTERTROCHANTRIC;  Surgeon: Shona Needles, MD;  Location: [facility];  Service: Orthopedics;  Laterality: Left;   KNEE ARTHROSCOPY Bilateral    POLYPECTOMY     Patient Active Problem List   Diagnosis Date Noted   Concussion with unknown loss of consciousness status 12/24/2020   Hypokalemia    Rib fractures 06/10/2020   Severe essential hypertension 11/15/2018   Syncope 12/26/2015   Pre-diabetes 12/26/2015   Hypertension     ONSET DATE: April 2022; referral 06/10/2021    REFERRING DIAG: 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 No Description: n/a  SPEECH THERAPY DISCHARGE SUMMARY  Visits from Start of Care: 9  Current functional level related to goals / functional outcomes: Pt reports significant subjective improvement in ability to communicate during daily interactions and when out in the community, evidenced by increased score on CPIB PROM. During structured, moderately complex language activities, pt demonstrates mild  improvement in verbal expression, using targeted skills, leading to increased success in communicative exchanges.    Remaining deficits: Pt continues to demonstrate usual vague language and impairment in understanding moderately complex tasks and instructions. Deficits do not appear to be anomia based, rather appear 2/2 delayed or impaired cognitive skills of processing, organization, complex problem solving, theory of mind. Limited awareness of deficits evidenced. Primary language impairment in domain of syntax occasionally impacting listener comprehension.    Education / Equipment: Anomia compensations, SFA, VNeST, syntax, NARNIA, supportive communication techniques, benefit of multiple opportunities to communicate with variety of partners    Patient agrees to discharge. Patient goals were partially met. Patient is being discharged due to being pleased with the current functional level..    OBJECTIVE:  TODAY'S TREATMENT:   08-12-21: targeted comprehensive retell of personally relevant stories with education provided on considering listener point of view, ensuring providing all details, and giving himself time to relay all details. Able to tell x3 storied novel to ST with occasional mod-A for completeness. Limited awareness of information listener (SLP) may or may not have regarding pt chosen topic. Demonstrates some misunderstanding of facts related to news story. Retells notable for pt telling "headline" first then giving details in fragmented, agrammatical points which SLP is required to piecemeal together. Pt and spouse endorse feeling like pt's communication has improved and they are pleased with pt's current functional status. Agreeable to d/c this  date. Administer CPIB, with pt reporting signification increase in perception of ability to communicate across variety of scenarios.   08-05-21: targeted sentence generation in structured task and conversation. [place] required frequent mod verbal and  questioning cues to add details and increase utterance length to 5+ words. Targeted verbal and gesture compensations for aphasia in structured task with frequent mod questioning cues. In conversation he also required questioning cues to add salient details and clarify vague utterances. Provided verbal HW, spouse to complete with him.  07-29-21: Pt returns with HEP completed. Notable for routine syntax errors, limited awareness even with read back. Vague response in describing steps of tasks. Able to fill in some details given questioning cues from [place]. Target verbal and written language production through hierarchical task related to common theme. First pt lists items related to theme, then sequences steps in completion, then responds to conversational questions about topic. Requires usual max-A to understand targeted task. Usual spelling and syntax errors exhibited with written production.  Weekly $ collection: Usual max-A for generation of objects related to task, generates x11 (naming); Rare mod-A for generating logical sequence of x10 events (procedural discourse), IND in answering x3 hypothetical questions related to topic, demonstrating adequate problem solving.  Previous work position: Usual max-A for generation of objects related to task, generates x13 (naming); Mod-I for generating logical sequence of x7 events (procedural discourse), IND in answering 3/3 hypothetical questions related to topic, demonstrating adequate problem solving.   07-22-21: Pt tells ST his communication is "getting better." Tells ST he is better able "to get my words out and finish my sentences." Target naming through listing supplies needed for typical tasks completed at home. For bill pay originally lists x2. Given max-A, able to add additional x7 items to list. Originally generated list of thoughts, not things, appearing to misunderstand the task. Target higher level communication skills thorough sequencing task of paying [facility]  bill through the mail. Requires max-A, faded to min-A for accuracy, sequencing, organization, and completeness. Limited awareness of errors, requires max-A to correct. Undated HEP for task mimicking targets addressed in therapy.   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 Usual prompting required to aid error awareness and occasional min to mod A needed for error correction. Max prompting required for patient to tell new therapist about himself. Wife provided intermittent appropriate cues for patient to expand his utterances for improved listener comprehension. SLP had patient name 15 car parts and generate sentence for each part with intermittent min A required to optimize syntax and word retrieval to better understand patient. Inconsistent awareness of errors noted. HEP provided for fill in blank of sentences and read aloud with wife providing assistance to cue patient if sentences don't make sense.     PATIENT EDUCATION: Education details: see above Person educated: Patient and Spouse Education method: Customer service manager Education comprehension: verbalized understanding, returned demonstration, and needs further education     GOALS: Goals reviewed with patient? Yes   SHORT TERM GOALS: Target date: 07/15/2021   Pt will demo successful usage of anomia compensations to make mod complex conversation functional, over 2 sessions  Baseline:  Goal status: Not Met   2.  Pt will perform practical naming tasks with 80% accuracy, occasional min-A over 2 sessions Baseline: 07/08/21, 07/15/21 Goal status: Met   3.  Pt will complete HEP given occasional min A over 2 sessions Baseline: 07/08/21, 07/15/21 Goal status: Met  LONG TERM GOALS: Target date: 09/09/2021   Pt/caregiver will report improved communication effectiveness via PROM by 2 points at last ST session Baseline: CPIB 12; 08-12-21 CPIB  27 Goal status: MET   2.  Pt will utilize anomia compensations in 15+ minute mod-complex conversation with rare min-A Baseline: 08-12-21 Goal status: PARTIALLY MET   3.  Pt will complete mod-complex language tasks (e.g. summarization) with rare min-A over 2 sessions Baseline: 08-05-21, 08-12-21 Goal status: PARTIALLY MET       ASSESSMENT:   CLINICAL IMPRESSION: Patient is a 81 y.o. M who was seen today for expressive aphasia resulting from [place] in April 2022. Pt presents with mild aphasia, notable for vagueness in connected speech, some occurences of overt word finding difficulties, and negative impact on pt's ability to communicate in community. Ongoing education and instruction of anomia compensations and targeted techniques to maximize verbal expression. Continues to demonstrate some comprehension deficits in structured tasks, frequent spelling and syntax errors with limited awareness, even with max-A. Limited theory of mind demonstrated during conversational speech leading to communication breakdowns. Despite SLP observations of persisting deficits, pt is pleased with functional status and requests d/c at this time.    OBJECTIVE IMPAIRMENTS include expressive language and aphasia. These impairments are limiting patient from effectively communicating at home and in community. Factors affecting potential to achieve goals and functional outcome are ability to learn/carryover information. Patient will benefit from skilled SLP services to address above impairments and improve overall function.   REHAB POTENTIAL: Fair     PLAN: SLP FREQUENCY: 1x/week   SLP DURATION: 12 weeks   PLANNED INTERVENTIONS: Language facilitation, Cueing hierachy, Internal/external aids, Functional tasks, Multimodal communication approach, and SLP instruction and feedback  Su Monks, [place] 08/12/2021, 11:01 AM

## 2021-08-20 DIAGNOSIS — B029 Zoster without complications: Secondary | ICD-10-CM | POA: Diagnosis not present

## 2021-08-20 DIAGNOSIS — M25551 Pain in right hip: Secondary | ICD-10-CM | POA: Diagnosis not present

## 2021-09-08 DIAGNOSIS — M170 Bilateral primary osteoarthritis of knee: Secondary | ICD-10-CM | POA: Diagnosis not present

## 2021-09-08 DIAGNOSIS — M25561 Pain in right knee: Secondary | ICD-10-CM | POA: Diagnosis not present

## 2022-02-24 DIAGNOSIS — R051 Acute cough: Secondary | ICD-10-CM | POA: Diagnosis not present

## 2022-02-24 DIAGNOSIS — J069 Acute upper respiratory infection, unspecified: Secondary | ICD-10-CM | POA: Diagnosis not present

## 2022-03-23 ENCOUNTER — Encounter (HOSPITAL_COMMUNITY): Payer: Self-pay

## 2022-03-23 ENCOUNTER — Emergency Department (HOSPITAL_BASED_OUTPATIENT_CLINIC_OR_DEPARTMENT_OTHER): Payer: Medicare PPO

## 2022-03-23 ENCOUNTER — Emergency Department (HOSPITAL_COMMUNITY)
Admission: EM | Admit: 2022-03-23 | Discharge: 2022-03-23 | Disposition: A | Discharge status: Home / Self Care | Payer: Medicare PPO | Attending: Emergency Medicine | Admitting: Emergency Medicine

## 2022-03-23 ENCOUNTER — Emergency Department (HOSPITAL_COMMUNITY): Payer: Medicare PPO

## 2022-03-23 ENCOUNTER — Other Ambulatory Visit: Payer: Self-pay

## 2022-03-23 DIAGNOSIS — M79661 Pain in right lower leg: Secondary | ICD-10-CM | POA: Diagnosis not present

## 2022-03-23 DIAGNOSIS — Z79899 Other long term (current) drug therapy: Secondary | ICD-10-CM | POA: Insufficient documentation

## 2022-03-23 DIAGNOSIS — M25461 Effusion, right knee: Secondary | ICD-10-CM | POA: Insufficient documentation

## 2022-03-23 DIAGNOSIS — M1711 Unilateral primary osteoarthritis, right knee: Secondary | ICD-10-CM | POA: Diagnosis not present

## 2022-03-23 DIAGNOSIS — I10 Essential (primary) hypertension: Secondary | ICD-10-CM | POA: Diagnosis not present

## 2022-03-23 DIAGNOSIS — G8929 Other chronic pain: Secondary | ICD-10-CM | POA: Diagnosis not present

## 2022-03-23 DIAGNOSIS — M25561 Pain in right knee: Secondary | ICD-10-CM | POA: Insufficient documentation

## 2022-03-23 DIAGNOSIS — Z20822 Contact with and (suspected) exposure to covid-19: Secondary | ICD-10-CM | POA: Diagnosis not present

## 2022-03-23 LAB — RESP PANEL BY RT-PCR (RSV, FLU A&B, COVID)  RVPGX2
Influenza A by PCR: NEGATIVE
Influenza B by PCR: NEGATIVE
Resp Syncytial Virus by PCR: NEGATIVE
SARS Coronavirus 2 by RT PCR: NEGATIVE

## 2022-03-23 MED ORDER — TRAMADOL HCL 50 MG PO TABS: 50.0000 mg | ORAL_TABLET | Freq: Four times a day (QID) | ORAL | 0 refills | Status: DC | PRN

## 2022-03-23 NOTE — Progress Notes (Signed)
RLE venous duplex has been completed.    Results can be found under chart review under CV PROC. 03/23/2022 11:12 AM Delvon Chipps RVT, RDMS

## 2022-03-23 NOTE — ED Triage Notes (Signed)
Complains of knee pain that started last night that radiates up to thigh and difficulty bearing weight or moving. Denies injury

## 2022-03-23 NOTE — ED Provider Notes (Signed)
[facility] Provider Note   CSN: [id number] Arrival date & time: 03/23/22  0102     History  Chief Complaint  Patient presents with   Knee Pain    Frank Hernandez is a 82 y.o. male PMH of HTN, pre diabetes and history of arthroscopy bilaterally for right-sided knee pain that he said he got 10 years ago has been chronic but has worsened acutely overnight.  He said last night he had to put a heating pad on this and he had a lot of pain when trying to walk on this and has not been able to put pressure on it.  Denies any fevers or vomiting but has had some "spit ups" after coughing up mucus.  Denies any chills.  Denies any trauma to this area or falls.  He denies any history of gout.  Took some Tylenol yesterday for pain. Denies any history of blood clots.    Knee Pain Associated symptoms: no back pain and no fever       Home Medications Prior to Admission medications   Medication Sig Start Date End Date Taking? Authorizing Provider  traMADol (ULTRAM) 50 MG tablet Take 1 tablet (50 mg total) by mouth every 6 (six) hours as needed. 03/23/22  Yes Gerrit Heck, MD  acetaminophen (TYLENOL) 325 MG tablet Take 2 tablets (650 mg total) by mouth every 6 (six) hours as needed for mild pain or moderate pain. 06/24/20   Norm Parcel, PA-C  amLODipine (NORVASC) 10 MG tablet Take 10 mg by mouth daily. Patient not taking: Reported on 06/17/2021    [provider]  celecoxib (CELEBREX) 200 MG capsule Take 1 capsule (200 mg total) by mouth 2 (two) times daily. Patient not taking: Reported on 12/24/2020 06/24/20   Norm Parcel, PA-C  DOXAZOSIN MESYLATE PO Take 2 mg by mouth every evening.    [provider]  polyethylene glycol (MIRALAX / GLYCOLAX) 17 g packet Take 17 g by mouth daily as needed for mild constipation. Patient not taking: Reported on 06/17/2021 06/24/20   Norm Parcel, PA-C  rosuvastatin (CRESTOR) 20 MG tablet  Take 20 mg by mouth daily. Patient not taking: Reported on 06/17/2021    [provider]  valsartan-hydrochlorothiazide (DIOVAN-HCT) 160-12.5 MG tablet 1 tablet    [provider]      Allergies    Patient has no known allergies.    Review of Systems   Review of Systems  Constitutional:  Negative for chills and fever.  HENT:  Negative for ear pain and sore throat.   Eyes:  Negative for pain and visual disturbance.  Respiratory:  Negative for cough and shortness of breath.   Cardiovascular:  Negative for chest pain and palpitations.  Gastrointestinal:  Negative for abdominal pain and vomiting.  Genitourinary:  Negative for dysuria and hematuria.  Musculoskeletal:  Positive for joint swelling. Negative for arthralgias and back pain.  Skin:  Negative for color change and rash.  Neurological:  Negative for seizures and syncope.  All other systems reviewed and are negative.   Physical Exam Updated Vital Signs BP (!) 161/86 (BP Location: Right Arm)   Pulse 90   Temp 98.1 F (36.7 C) (Oral)   Resp 18   Ht 5' 9.5" (1.765 m)   Wt 102.5 kg   SpO2 97%   BMI 32.90 kg/m  Physical Exam Vitals and nursing note reviewed.  Constitutional:      General: He is  not in acute distress.    Appearance: He is well-developed.  HENT:     Head: Normocephalic and atraumatic.  Eyes:     Conjunctiva/sclera: Conjunctivae normal.  Cardiovascular:     Rate and Rhythm: Normal rate and regular rhythm.     Heart sounds: No murmur heard. Pulmonary:     Effort: Pulmonary effort is normal. No respiratory distress.     Breath sounds: Normal breath sounds.  Abdominal:     Palpations: Abdomen is soft.     Tenderness: There is no abdominal tenderness.  Musculoskeletal:        General: Swelling present.     Cervical back: Neck supple.     Comments: Right knee with some swelling compared to left. No significant warmth. No tenderness on palpation of joint lines. Unable to actively or  passively move right knee. NVI b/l. No significant leg swelling but is tender to palpation of calf.  Skin:    General: Skin is warm and dry.     Capillary Refill: Capillary refill takes less than 2 seconds.  Neurological:     Mental Status: He is alert.  Psychiatric:        Mood and Affect: Mood normal.     ED Results / Procedures / Treatments   Labs (all labs ordered are listed, but only abnormal results are displayed) Labs Reviewed  RESP PANEL BY RT-PCR (RSV, FLU A&B, COVID)  [id number]    EKG None  Radiology VAS [place] LOWER EXTREMITY VENOUS (DVT)  Result Date: 03/23/2022  Lower Venous DVT Study Patient Name:  Frank Hernandez  Date of Exam:   03/23/2022 Medical Rec #: [id number]            Accession #:    [id number] Date of Birth: 10/18/1940             Patient Gender: M Patient Age:   89 years Exam Location:  [facility] Procedure:      VAS [place] LOWER EXTREMITY VENOUS (DVT) Referring Phys: Marda Stalker --------------------------------------------------------------------------------  Indications: Knee pain.  Limitations: Poor ultrasound/tissue interface. Performing Technologist: Rogelia Rohrer RVT, RDMS  Examination Guidelines: A complete evaluation includes B-mode imaging, spectral Doppler, color Doppler, and power Doppler as needed of all accessible portions of each vessel. Bilateral testing is considered an integral part of a complete examination. Limited examinations for reoccurring indications may be performed as noted. The reflux portion of the exam is performed with the patient in reverse Trendelenburg.  +---------+---------------+---------+-----------+----------+--------------+ RIGHT    CompressibilityPhasicitySpontaneityPropertiesThrombus Aging +---------+---------------+---------+-----------+----------+--------------+ CFV      Full           Yes      Yes                                 +---------+---------------+---------+-----------+----------+--------------+ SFJ       Full                                                        +---------+---------------+---------+-----------+----------+--------------+ FV Prox  Full           Yes      Yes                                 +---------+---------------+---------+-----------+----------+--------------+  FV Mid   Full           Yes      Yes                                 +---------+---------------+---------+-----------+----------+--------------+ FV DistalFull           Yes      Yes                                 +---------+---------------+---------+-----------+----------+--------------+ PFV      Full                                                        +---------+---------------+---------+-----------+----------+--------------+ POP      Full           Yes      Yes                                 +---------+---------------+---------+-----------+----------+--------------+ PTV      Full                                                        +---------+---------------+---------+-----------+----------+--------------+ PERO     Full                                                        +---------+---------------+---------+-----------+----------+--------------+   +----+---------------+---------+-----------+----------+--------------+ LEFTCompressibilityPhasicitySpontaneityPropertiesThrombus Aging +----+---------------+---------+-----------+----------+--------------+ CFV Full           Yes      Yes                                 +----+---------------+---------+-----------+----------+--------------+     Summary: RIGHT: - No evidence of common femoral vein obstruction. - No cystic structure found in the popliteal fossa. Subcutaneous edema seen in area of popliteal fossa and calf.  LEFT: - No evidence of common femoral vein obstruction.  *See table(s) above for measurements and observations.    Preliminary    DG Knee Complete 4 Views Right  Result Date: 03/23/2022 CLINICAL  DATA:  Lateral left knee pain starting last night. Radiates up left thigh. EXAM: RIGHT KNEE - COMPLETE 4+ VIEW COMPARISON:  None Available. FINDINGS: There is diffuse decreased bone mineralization. Tiny joint effusion. Moderate patellofemoral joint space narrowing and peripheral osteophytosis. Minimal chronic enthesopathic change at the quadriceps insertion on the patella. Moderate to severe medial and mild lateral compartment joint space narrowing and peripheral osteophytosis. No acute fracture is seen. 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 Electronically Signed   By: Yvonne Kendall M.D.   On: 03/23/2022 08:42    Procedures Procedures   Medications Ordered in ED Medications - No data to display  ED Course/ Medical Decision Making/ A&P  Medical Decision Making Amount and/or Complexity of Data Reviewed Radiology: ordered.  Risk Prescription drug management.   Medical Decision Making:   Frank Hernandez is a 82 y.o. male who presented to the ED today with right knee pain detailed above.    Additional history discussed with patient's family/caregivers.  Complete initial physical exam performed, notably the patient  was unable to passively or actively have ROM of right knee.    Reviewed and confirmed nursing documentation for past medical history, family history, social history.    Initial Assessment:   With the patient's presentation of right knee pain, most likely diagnosis is exacerbation of knee osteoarthritis. Other diagnoses were considered including (but not limited to) septic arthritis-no systemic symptoms of fevers, blood clot, fracture, gout. These are considered less likely due to history of present illness and physical exam findings.    Initial Plan:  DVT u/s and Right knee XR  Flu/Covid for cough Objective evaluation as below reviewed   Initial Study Results:    Radiology:  All images  reviewed independently. Agree with radiology report at this time.   VAS [place] LOWER EXTREMITY VENOUS (DVT)  Result Date: 03/23/2022  Lower Venous DVT Study Patient Name:  Frank Hernandez  Date of Exam:   03/23/2022 Medical Rec #: [id number]            Accession #:    [id number] Date of Birth: October 23, 1940             Patient Gender: M Patient Age:   65 years Exam Location[facility] Procedure:      VAS [place] LOWER EXTREMITY VENOUS (DVT) Referring Phys: Marda Stalker --------------------------------------------------------------------------------  Indications: Knee pain.  Limitations: Poor ultrasound/tissue interface. Performing Technologist: Rogelia Rohrer RVT, RDMS  Examination Guidelines: A complete evaluation includes B-mode imaging, spectral Doppler, color Doppler, and power Doppler as needed of all accessible portions of each vessel. Bilateral testing is considered an integral part of a complete examination. Limited examinations for reoccurring indications may be performed as noted. The reflux portion of the exam is performed with the patient in reverse Trendelenburg.  +---------+---------------+---------+-----------+----------+--------------+ RIGHT    CompressibilityPhasicitySpontaneityPropertiesThrombus Aging +---------+---------------+---------+-----------+----------+--------------+ CFV      Full           Yes      Yes                                 +---------+---------------+---------+-----------+----------+--------------+ SFJ      Full                                                        +---------+---------------+---------+-----------+----------+--------------+ FV Prox  Full           Yes      Yes                                 +---------+---------------+---------+-----------+----------+--------------+ FV Mid   Full           Yes      Yes                                 +---------+---------------+---------+-----------+----------+--------------+  FV DistalFull            Yes      Yes                                 +---------+---------------+---------+-----------+----------+--------------+ PFV      Full                                                        +---------+---------------+---------+-----------+----------+--------------+ POP      Full           Yes      Yes                                 +---------+---------------+---------+-----------+----------+--------------+ PTV      Full                                                        +---------+---------------+---------+-----------+----------+--------------+ PERO     Full                                                        +---------+---------------+---------+-----------+----------+--------------+   +----+---------------+---------+-----------+----------+--------------+ LEFTCompressibilityPhasicitySpontaneityPropertiesThrombus Aging +----+---------------+---------+-----------+----------+--------------+ CFV Full           Yes      Yes                                 +----+---------------+---------+-----------+----------+--------------+     Summary: RIGHT: - No evidence of common femoral vein obstruction. - No cystic structure found in the popliteal fossa. Subcutaneous edema seen in area of popliteal fossa and calf.  LEFT: - No evidence of common femoral vein obstruction.  *See table(s) above for measurements and observations.    Preliminary    DG Knee Complete 4 Views Right  Result Date: 03/23/2022 CLINICAL DATA:  Lateral left knee pain starting last night. Radiates up left thigh. EXAM: RIGHT KNEE - COMPLETE 4+ VIEW COMPARISON:  None Available. FINDINGS: There is diffuse decreased bone mineralization. Tiny joint effusion. Moderate patellofemoral joint space narrowing and peripheral osteophytosis. Minimal chronic enthesopathic change at the quadriceps insertion on the patella. Moderate to severe medial and mild lateral compartment joint space narrowing and  peripheral osteophytosis. No acute fracture is seen. 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 Electronically Signed   By: Yvonne Kendall M.D.   On: 03/23/2022 08:42      Final Assessment and Plan:   82 year old male coming in with right knee pain that has been worse since last night.  No fevers/systemic symptoms, no large effusion on examination or on x-ray that would suggest a septic arthritis.  X-ray showed moderate to severe osteoarthritis which I believe is most likely cause of patient's pain today.  DVT ultrasound was negative for any lower extremity clot.  Patient was discharged home with  knee brace, tramadol for severe pain and symptomatic measures such of resting and icing the area.  Discussed to have close follow-up with PCP and orthopedic doctor.   Clinical Impression:  1. Chronic pain of right knee      Discharge     Final diagnoses:  Chronic pain of right knee    Rx / DC Orders ED Discharge Orders          Ordered    traMADol (ULTRAM) 50 MG tablet  Every 6 hours PRN        03/23/22 1126              Gerrit Heck, MD 03/23/22 1131    Tegeler, Gwenyth Allegra, MD 03/23/22 [phone]

## 2022-03-23 NOTE — Discharge Instructions (Addendum)
Your knee x-rays showed a tiny effusion and no fractures- you have moderate/severe changes Your ultrasound did not show any blood clots Your viral test was negative I am recommending resting, icing the area and elevating the area and using anti-inflammatory medications to help I recommend following up closely with your orthopedic doctor and your PCP I am prescribing tramadol for if you have severe pain  Please return if starting to have fevers, worsening swelling of this area with redness, warmth of that joint

## 2022-03-23 NOTE — ED Notes (Signed)
Called lab bc covid swab is not in process and lab is checking for specimen.

## 2022-03-29 ENCOUNTER — Encounter (HOSPITAL_COMMUNITY): Payer: Self-pay

## 2022-03-29 ENCOUNTER — Emergency Department (HOSPITAL_COMMUNITY): Payer: Medicare PPO

## 2022-03-29 ENCOUNTER — Inpatient Hospital Stay (HOSPITAL_COMMUNITY)
Admission: EM | Admit: 2022-03-29 | Discharge: 2022-04-05 | DRG: 554 | Disposition: A | Discharge status: Skilled Nursing Facility | Payer: Medicare PPO | Attending: Student | Admitting: Student

## 2022-03-29 DIAGNOSIS — E669 Obesity, unspecified: Secondary | ICD-10-CM | POA: Diagnosis present

## 2022-03-29 DIAGNOSIS — Z79899 Other long term (current) drug therapy: Secondary | ICD-10-CM | POA: Diagnosis not present

## 2022-03-29 DIAGNOSIS — D631 Anemia in chronic kidney disease: Secondary | ICD-10-CM | POA: Diagnosis present

## 2022-03-29 DIAGNOSIS — M23231 Derangement of other medial meniscus due to old tear or injury, right knee: Secondary | ICD-10-CM | POA: Diagnosis not present

## 2022-03-29 DIAGNOSIS — R638 Other symptoms and signs concerning food and fluid intake: Secondary | ICD-10-CM | POA: Insufficient documentation

## 2022-03-29 DIAGNOSIS — E871 Hypo-osmolality and hyponatremia: Secondary | ICD-10-CM | POA: Diagnosis present

## 2022-03-29 DIAGNOSIS — M1711 Unilateral primary osteoarthritis, right knee: Secondary | ICD-10-CM | POA: Diagnosis present

## 2022-03-29 DIAGNOSIS — M6281 Muscle weakness (generalized): Secondary | ICD-10-CM | POA: Diagnosis not present

## 2022-03-29 DIAGNOSIS — L89311 Pressure ulcer of right buttock, stage 1: Secondary | ICD-10-CM | POA: Diagnosis present

## 2022-03-29 DIAGNOSIS — L8990 Pressure ulcer of unspecified site, unspecified stage: Secondary | ICD-10-CM | POA: Insufficient documentation

## 2022-03-29 DIAGNOSIS — R262 Difficulty in walking, not elsewhere classified: Secondary | ICD-10-CM | POA: Diagnosis not present

## 2022-03-29 DIAGNOSIS — M25561 Pain in right knee: Secondary | ICD-10-CM | POA: Diagnosis not present

## 2022-03-29 DIAGNOSIS — E785 Hyperlipidemia, unspecified: Secondary | ICD-10-CM | POA: Diagnosis present

## 2022-03-29 DIAGNOSIS — R7303 Prediabetes: Secondary | ICD-10-CM | POA: Diagnosis present

## 2022-03-29 DIAGNOSIS — L8991 Pressure ulcer of unspecified site, stage 1: Secondary | ICD-10-CM | POA: Diagnosis not present

## 2022-03-29 DIAGNOSIS — R4701 Aphasia: Secondary | ICD-10-CM | POA: Diagnosis not present

## 2022-03-29 DIAGNOSIS — I129 Hypertensive chronic kidney disease with stage 1 through stage 4 chronic kidney disease, or unspecified chronic kidney disease: Secondary | ICD-10-CM | POA: Diagnosis present

## 2022-03-29 DIAGNOSIS — L89211 Pressure ulcer of right hip, stage 1: Secondary | ICD-10-CM | POA: Diagnosis present

## 2022-03-29 DIAGNOSIS — Z7401 Bed confinement status: Secondary | ICD-10-CM | POA: Diagnosis not present

## 2022-03-29 DIAGNOSIS — M11261 Other chondrocalcinosis, right knee: Secondary | ICD-10-CM | POA: Diagnosis present

## 2022-03-29 DIAGNOSIS — N1831 Chronic kidney disease, stage 3a: Secondary | ICD-10-CM | POA: Diagnosis present

## 2022-03-29 DIAGNOSIS — Z6832 Body mass index (BMI) 32.0-32.9, adult: Secondary | ICD-10-CM | POA: Diagnosis not present

## 2022-03-29 DIAGNOSIS — M25461 Effusion, right knee: Secondary | ICD-10-CM | POA: Diagnosis not present

## 2022-03-29 DIAGNOSIS — I10 Essential (primary) hypertension: Secondary | ICD-10-CM | POA: Diagnosis present

## 2022-03-29 DIAGNOSIS — N179 Acute kidney failure, unspecified: Secondary | ICD-10-CM | POA: Diagnosis present

## 2022-03-29 DIAGNOSIS — R531 Weakness: Secondary | ICD-10-CM | POA: Diagnosis not present

## 2022-03-29 LAB — BASIC METABOLIC PANEL
Anion gap: 12 (ref 5–15)
BUN: 33 mg/dL — ABNORMAL HIGH (ref 8–23)
CO2: 20 mmol/L — ABNORMAL LOW (ref 22–32)
Calcium: 9.3 mg/dL (ref 8.9–10.3)
Chloride: 105 mmol/L (ref 98–111)
Creatinine, Ser: 1.66 mg/dL — ABNORMAL HIGH (ref 0.61–1.24)
GFR, Estimated: 41 mL/min — ABNORMAL LOW (ref >60)
Glucose, Bld: 97 mg/dL (ref 70–99)
Potassium: 4.2 mmol/L (ref 3.5–5.1)
Sodium: 137 mmol/L (ref 135–145)

## 2022-03-29 LAB — SYNOVIAL CELL COUNT + DIFF, W/ CRYSTALS
Crystals, Fluid: NONE SEEN
Eosinophils-Synovial: 0 % (ref 0–1)
Lymphocytes-Synovial Fld: 7 % (ref 0–20)
Monocyte-Macrophage-Synovial Fluid: 9 % — ABNORMAL LOW (ref 50–90)
Neutrophil, Synovial: 84 % — ABNORMAL HIGH (ref 0–25)
WBC, Synovial: 3720 /mm3 — ABNORMAL HIGH (ref 0–200)

## 2022-03-29 LAB — CBC
HCT: 29.6 % — ABNORMAL LOW (ref 39.0–52.0)
Hemoglobin: 9.8 g/dL — ABNORMAL LOW (ref 13.0–17.0)
MCH: 31.3 pg (ref 26.0–34.0)
MCHC: 33.1 g/dL (ref 30.0–36.0)
MCV: 94.6 fL (ref 80.0–100.0)
Platelets: 198 10*3/uL (ref 150–400)
RBC: 3.13 MIL/uL — ABNORMAL LOW (ref 4.22–5.81)
RDW: 13.2 % (ref 11.5–15.5)
WBC: 5 10*3/uL (ref 4.0–10.5)
nRBC: 0 % (ref 0.0–0.2)

## 2022-03-29 MED ORDER — LIDOCAINE HCL (PF) 1 % IJ SOLN
10.0000 mL | Freq: Once | INTRAMUSCULAR | Status: AC
  Administered 2022-03-29: 10 mL
  Filled 2022-03-29: qty 30

## 2022-03-29 MED ORDER — LACTATED RINGERS IV BOLUS
1000.0000 mL | Freq: Once | INTRAVENOUS | Status: AC
  Administered 2022-03-29: 1000 mL via INTRAVENOUS

## 2022-03-29 MED ORDER — HYDROCODONE-ACETAMINOPHEN 5-325 MG PO TABS
1.0000 | ORAL_TABLET | Freq: Once | ORAL | Status: AC
  Administered 2022-03-29: 1 via ORAL
  Filled 2022-03-29: qty 1

## 2022-03-29 NOTE — ED Triage Notes (Signed)
Pt presents via EMS from home with c/o right knee pain. Pt denies any injury to that area. Pt reports the knee started to hurt on Tuesday. No redness, heat to the area. Per EMS, pt does have some notable swelling to the area.

## 2022-03-29 NOTE — ED Provider Triage Note (Signed)
Emergency Medicine Provider Triage Evaluation Note  Frank Hernandez , a 82 y.o. male  was evaluated in triage.  Pt complains of right knee pain, chronic in nature, has been seen for this same issue before.  Recently seen on 1/23 by Dr. Sherry Ruffing, discharged home with knee immobilizer and referral for orthopedics.  Patient unsure if he has made this appointment.  At this visit patient had knee x-ray which showed small effusion, ultrasound which was unremarkable.  Patient discharged home.  Patient reports that ever since being seen on 1/23 the knee pain is worsened.  Patient denies fevers, nausea or vomiting.  Review of Systems  Positive:  Negative:   Physical Exam  BP 132/79 (BP Location: Left Arm)   Pulse 100   Temp 98.4 F (36.9 C) (Oral)   Resp 18   SpO2 97%  Gen:   Awake, no distress   Resp:  Normal effort  MSK:   Moves extremities without difficulty  Other:  Swollen right knee, medial tenderness to palpation.  No overlying skin change.  Medical Decision Making  Medically screening exam initiated at 12:27 PM.  Appropriate orders placed.  KASH DAVIE was informed that the remainder of the evaluation will be completed by another provider, this initial triage assessment does not replace that evaluation, and the importance of remaining in the ED until their evaluation is complete.     Azucena Cecil, PA-C 03/29/22 1228

## 2022-03-29 NOTE — ED Notes (Signed)
Unsuccessful IV attempt x2.  

## 2022-03-29 NOTE — ED Provider Notes (Signed)
[facility] Provider Note   CSN: [id number] Arrival date & time: 03/29/22  1208     History  Chief Complaint  Patient presents with   Knee Pain    Frank Hernandez is a 82 y.o. male.   Knee Pain    82 year old male presenting to the emergency department with a chief complaint of right knee pain.  The patient was seen in the emergency department on 1/23 for the same complaint and discharged home with a knee immobilizer and referral for outpatient follow-up with orthopedics.  Since then, he has been bedbound with worsening knee pain and inability to range the right knee.  He has developed a pressure ulcer/bedsore in his back which is causing him pain. He denies any fevers or chills. He states his knee swelling and pain has worsened. No redness to the knee. No falls or trauma.  Home Medications Prior to Admission medications   Medication Sig Start Date End Date Taking? Authorizing Provider  acetaminophen (TYLENOL) 325 MG tablet Take 2 tablets (650 mg total) by mouth every 6 (six) hours as needed for mild pain or moderate pain. 06/24/20   Norm Parcel, PA-C  amLODipine (NORVASC) 10 MG tablet Take 10 mg by mouth daily. Patient not taking: Reported on 06/17/2021    [provider]  celecoxib (CELEBREX) 200 MG capsule Take 1 capsule (200 mg total) by mouth 2 (two) times daily. Patient not taking: Reported on 12/24/2020 06/24/20   Norm Parcel, PA-C  DOXAZOSIN MESYLATE PO Take 2 mg by mouth every evening.    [provider]  polyethylene glycol (MIRALAX / GLYCOLAX) 17 g packet Take 17 g by mouth daily as needed for mild constipation. Patient not taking: Reported on 06/17/2021 06/24/20   Norm Parcel, PA-C  rosuvastatin (CRESTOR) 20 MG tablet Take 20 mg by mouth daily. Patient not taking: Reported on 06/17/2021    [provider]  traMADol (ULTRAM) 50 MG tablet Take 1 tablet (50 mg total) by mouth every 6  (six) hours as needed. 03/23/22   Gerrit Heck, MD  valsartan-hydrochlorothiazide (DIOVAN-HCT) 160-12.5 MG tablet 1 tablet    [provider]      Allergies    Patient has no known allergies.    Review of Systems   Review of Systems  Musculoskeletal:  Positive for arthralgias and joint swelling.  All other systems reviewed and are negative.   Physical Exam Updated Vital Signs BP (!) 158/86 (BP Location: Left Arm)   Pulse (!) 103   Temp 98.4 F (36.9 C) (Oral)   Resp 16   SpO2 98%  Physical Exam Vitals and nursing note reviewed.  Constitutional:      General: He is not in acute distress.    Appearance: He is well-developed.  HENT:     Head: Normocephalic and atraumatic.  Eyes:     Conjunctiva/sclera: Conjunctivae normal.  Cardiovascular:     Rate and Rhythm: Normal rate and regular rhythm.  Pulmonary:     Effort: Pulmonary effort is normal. No respiratory distress.     Breath sounds: Normal breath sounds.  Abdominal:     Palpations: Abdomen is soft.     Tenderness: There is no abdominal tenderness.  Musculoskeletal:        General: Swelling and tenderness present.     Cervical back: Neck supple.     Comments: Right knee with tenderness to palpation, pain with any attempts at passive ROM, swelling,  no erythema.   Skin:    General: Skin is warm and dry.     Capillary Refill: Capillary refill takes less than 2 seconds.  Neurological:     Mental Status: He is alert.  Psychiatric:        Mood and Affect: Mood normal.     ED Results / Procedures / Treatments   Labs (all labs ordered are listed, but only abnormal results are displayed) Labs Reviewed  CBC - Abnormal; Notable for the following components:      Result Value   RBC 3.13 (*)    Hemoglobin 9.8 (*)    HCT 29.6 (*)    All other components within normal limits  BASIC METABOLIC PANEL - Abnormal; Notable for the following components:   CO2 20 (*)    BUN 33 (*)    Creatinine, Ser 1.66 (*)     GFR, Estimated 41 (*)    All other components within normal limits  SYNOVIAL CELL COUNT + DIFF, W/ CRYSTALS - Abnormal; Notable for the following components:   Color, Synovial RED (*)    Appearance-Synovial HAZY (*)    WBC, Synovial 3,720 (*)    Neutrophil, Synovial 84 (*)    Monocyte-Macrophage-Synovial Fluid 9 (*)    All other components within normal limits  BODY FLUID CULTURE W GRAM STAIN  GLUCOSE, BODY FLUID OTHER            PROTEIN, BODY FLUID (OTHER)  PATHOLOGIST SMEAR REVIEW    EKG None  Radiology DG Knee Complete 4 Views Right  Result Date: 03/29/2022 CLINICAL DATA:  Chronic knee pain. EXAM: RIGHT KNEE - COMPLETE 4+ VIEW COMPARISON:  03/23/2018 for FINDINGS: Small suprapatellar joint effusion. Progressive soft tissue edema about the distal portion of the femur and knee. No acute fracture marked medial compartment joint space narrowing with marginal spur formation. Mild degenerative changes noted within the patellofemoral and lateral compartment. Sharpening of the tibial spines. Mild chondrocalcinosis. IMPRESSION: 1. Progressive soft tissue edema about the distal femur and knee. 2. Small joint effusion. 3. 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 Electronically Signed   By: Kerby Moors M.D.   On: 03/29/2022 12:59    Procedures .Joint Aspiration/Arthrocentesis  Date/Time: 03/29/2022 6:45 PM  Performed by: Regan Lemming, MD Authorized by: Regan Lemming, MD   Consent:    Consent obtained:  Verbal   Consent given by:  Patient   Risks discussed:  Bleeding, infection and pain   Alternatives discussed:  Delayed treatment Universal protocol:    Patient identity confirmed:  Verbally with patient and arm band Location:    Location:  Knee   Knee:  R knee Anesthesia:    Anesthesia method:  Local infiltration   Local anesthetic:  Lidocaine 1% w/o epi Procedure details:    Preparation: Patient was prepped and draped in usual sterile fashion      Needle gauge:  18 G   Ultrasound guidance: no     Approach:  Lateral   Aspirate amount:  20m   Aspirate characteristics:  Serous   Steroid injected: no   Post-procedure details:    Dressing:  Adhesive bandage   Procedure completion:  Tolerated     Medications Ordered in ED Medications  lidocaine (PF) (XYLOCAINE) 1 % injection 10 mL (10 mLs Infiltration Given by Other 03/29/22 2145)  HYDROcodone-acetaminophen (NORCO/VICODIN) 5-325 MG per tablet 1 tablet (1 tablet Oral Given 03/29/22 1939)  lactated ringers bolus 1,000 mL (1,000 mLs Intravenous New Bag/Given 03/29/22 2302)  ED Course/ Medical Decision Making/ A&P Clinical Course as of 03/30/22 0011  Tue Mar 30, 2022  0009 WBC, Synovial(!): 3,720 [JL]  0009 Neutrophil, Synovial(!): 84 [JL]  0009 Crystals, Fluid: NO CRYSTALS SEEN [JL]    Clinical Course User Index [JL] Regan Lemming, MD                             Medical Decision Making Amount and/or Complexity of Data Reviewed Labs: ordered. Decision-making details documented in ED Course.  Risk Prescription drug management. Decision regarding hospitalization.    82 year old male presenting to the emergency department with a chief complaint of right knee pain.  The patient was seen in the emergency department on 1/23 for the same complaint and discharged home with a knee immobilizer and referral for outpatient follow-up with orthopedics.  Since then, he has been bedbound with worsening knee pain and inability to range the right knee.  He has developed a pressure ulcer/bedsore in his back which is causing him pain. He denies any fevers or chills. He states his knee swelling and pain has worsened. No redness to the knee. No falls or trauma.  On arrival, the patient was vitally stable, afebrile, borderline tachycardic heart rate 100, BP 132/79, subsequent vitals heart rate 94.  Physical exam significant for right knee that was swollen, tender to palpation, pain with any attempts  at range of motion, no surrounding erythema.  Differential diagnosis includes knee effusion from osteoarthritis, septic arthritis hide on the differential, gout although patient denies any history of the same. X-ray was performed which revealed progressive soft tissue edema about the distal femur and knee, small joint effusion, advanced medial compartment osteoarthritis.   Initial triage laboratory evaluation significant for CBC without a leukocytosis, anemia present to 9.8, improved from prior measurements, BMP with evidence of an AKI with a serum creatinine of 1.66, elevated BUN to 33.  I discussed with the patient bedside the need for arthrocentesis to which she consented due to his worsening knee pain and swelling in progressive immobility.  Arthrocentesis was performed and results indicate no evidence of septic arthritis or gouty arthritis with no crystals seen.  WBC of the synovial fluid was notably 3700.  Patient is without a leukocytosis.  Due to his AKI and worsening mobility, will admit the patient to the hospitalist service for fluid resuscitation, work with PT, further consideration for placement.   Final Clinical Impression(s) / ED Diagnoses Final diagnoses:  Acute pain of right knee  AKI (acute kidney injury) [facility])    Rx / DC Orders ED Discharge Orders     None         Regan Lemming, MD 03/30/22 0011

## 2022-03-30 DIAGNOSIS — L8991 Pressure ulcer of unspecified site, stage 1: Secondary | ICD-10-CM

## 2022-03-30 DIAGNOSIS — N179 Acute kidney failure, unspecified: Secondary | ICD-10-CM | POA: Diagnosis not present

## 2022-03-30 DIAGNOSIS — M25561 Pain in right knee: Secondary | ICD-10-CM | POA: Diagnosis not present

## 2022-03-30 DIAGNOSIS — L8990 Pressure ulcer of unspecified site, unspecified stage: Secondary | ICD-10-CM | POA: Insufficient documentation

## 2022-03-30 DIAGNOSIS — M25461 Effusion, right knee: Secondary | ICD-10-CM | POA: Diagnosis not present

## 2022-03-30 DIAGNOSIS — M1711 Unilateral primary osteoarthritis, right knee: Secondary | ICD-10-CM | POA: Diagnosis not present

## 2022-03-30 DIAGNOSIS — I10 Essential (primary) hypertension: Secondary | ICD-10-CM | POA: Diagnosis not present

## 2022-03-30 LAB — BASIC METABOLIC PANEL
Anion gap: 7 (ref 5–15)
BUN: 30 mg/dL — ABNORMAL HIGH (ref 8–23)
CO2: 19 mmol/L — ABNORMAL LOW (ref 22–32)
Calcium: 8.6 mg/dL — ABNORMAL LOW (ref 8.9–10.3)
Chloride: 110 mmol/L (ref 98–111)
Creatinine, Ser: 1.42 mg/dL — ABNORMAL HIGH (ref 0.61–1.24)
GFR, Estimated: 50 mL/min — ABNORMAL LOW (ref >60)
Glucose, Bld: 87 mg/dL (ref 70–99)
Potassium: 4.3 mmol/L (ref 3.5–5.1)
Sodium: 136 mmol/L (ref 135–145)

## 2022-03-30 LAB — SYNOVIAL CELL COUNT + DIFF, W/ CRYSTALS
Crystals, Fluid: NONE SEEN
Lymphocytes-Synovial Fld: 1 % (ref 0–20)
Monocyte-Macrophage-Synovial Fluid: 13 % — ABNORMAL LOW (ref 50–90)
Neutrophil, Synovial: 86 % — ABNORMAL HIGH (ref 0–25)
WBC, Synovial: UNDETERMINED /mm3 (ref 0–200)

## 2022-03-30 LAB — CBC WITH DIFFERENTIAL/PLATELET
Abs Immature Granulocytes: 0.03 10*3/uL (ref 0.00–0.07)
Basophils Absolute: 0 10*3/uL (ref 0.0–0.1)
Basophils Relative: 0 %
Eosinophils Absolute: 0.2 10*3/uL (ref 0.0–0.5)
Eosinophils Relative: 4 %
HCT: 28.1 % — ABNORMAL LOW (ref 39.0–52.0)
Hemoglobin: 9.1 g/dL — ABNORMAL LOW (ref 13.0–17.0)
Immature Granulocytes: 1 %
Lymphocytes Relative: 14 %
Lymphs Abs: 0.7 10*3/uL (ref 0.7–4.0)
MCH: 30.6 pg (ref 26.0–34.0)
MCHC: 32.4 g/dL (ref 30.0–36.0)
MCV: 94.6 fL (ref 80.0–100.0)
Monocytes Absolute: 0.8 10*3/uL (ref 0.1–1.0)
Monocytes Relative: 16 %
Neutro Abs: 3.3 10*3/uL (ref 1.7–7.7)
Neutrophils Relative %: 65 %
Platelets: 187 10*3/uL (ref 150–400)
RBC: 2.97 MIL/uL — ABNORMAL LOW (ref 4.22–5.81)
RDW: 13.1 % (ref 11.5–15.5)
WBC: 5.1 10*3/uL (ref 4.0–10.5)
nRBC: 0 % (ref 0.0–0.2)

## 2022-03-30 MED ORDER — ENOXAPARIN SODIUM 60 MG/0.6ML IJ SOSY
50.0000 mg | PREFILLED_SYRINGE | INTRAMUSCULAR | Status: DC
  Filled 2022-03-30: qty 0.6

## 2022-03-30 MED ORDER — BUPIVACAINE HCL (PF) 0.5 % IJ SOLN
10.0000 mL | Freq: Once | INTRAMUSCULAR | Status: AC | PRN
  Administered 2022-03-30: 10 mL
  Filled 2022-03-30: qty 10

## 2022-03-30 MED ORDER — AMLODIPINE BESYLATE 5 MG PO TABS
10.0000 mg | ORAL_TABLET | Freq: Every day | ORAL | Status: DC
  Administered 2022-03-30: 10 mg via ORAL
  Filled 2022-03-30: qty 2

## 2022-03-30 MED ORDER — ONDANSETRON HCL 4 MG/2ML IJ SOLN
4.0000 mg | Freq: Four times a day (QID) | INTRAMUSCULAR | Status: DC | PRN
  Administered 2022-04-01 – 2022-04-02 (×2): 4 mg via INTRAVENOUS
  Filled 2022-03-30 (×2): qty 2

## 2022-03-30 MED ORDER — SENNOSIDES-DOCUSATE SODIUM 8.6-50 MG PO TABS: 1.0000 | ORAL_TABLET | Freq: Every evening | ORAL | Status: DC | PRN

## 2022-03-30 MED ORDER — ACETAMINOPHEN 650 MG RE SUPP: 650.0000 mg | Freq: Four times a day (QID) | RECTAL | Status: DC | PRN

## 2022-03-30 MED ORDER — HYDROCODONE-ACETAMINOPHEN 5-325 MG PO TABS
1.0000 | ORAL_TABLET | Freq: Four times a day (QID) | ORAL | Status: DC | PRN
  Administered 2022-03-30 – 2022-04-05 (×22): 1 via ORAL
  Filled 2022-03-30 (×22): qty 1

## 2022-03-30 MED ORDER — ROSUVASTATIN CALCIUM 20 MG PO TABS
20.0000 mg | ORAL_TABLET | Freq: Every day | ORAL | Status: DC
  Administered 2022-03-30: 20 mg via ORAL
  Filled 2022-03-30: qty 1

## 2022-03-30 MED ORDER — ENOXAPARIN SODIUM 60 MG/0.6ML IJ SOSY
50.0000 mg | PREFILLED_SYRINGE | INTRAMUSCULAR | Status: DC
  Administered 2022-03-30 – 2022-04-05 (×7): 50 mg via SUBCUTANEOUS
  Filled 2022-03-30 (×7): qty 0.6

## 2022-03-30 MED ORDER — LIDOCAINE HCL (PF) 0.5 % IJ SOLN
10.0000 mL | Freq: Once | INTRAMUSCULAR | Status: AC | PRN
  Administered 2022-03-30: 10 mL via INTRADERMAL
  Filled 2022-03-30: qty 10

## 2022-03-30 MED ORDER — ACETAMINOPHEN 325 MG PO TABS
650.0000 mg | ORAL_TABLET | Freq: Four times a day (QID) | ORAL | Status: DC | PRN
  Administered 2022-04-04: 650 mg via ORAL
  Filled 2022-03-30: qty 2

## 2022-03-30 MED ORDER — METHYLPREDNISOLONE ACETATE 40 MG/ML IJ SUSP
20.0000 mg | Freq: Once | INTRAMUSCULAR | Status: AC | PRN
  Administered 2022-03-30: 20 mg via INTRA_ARTICULAR
  Filled 2022-03-30: qty 1

## 2022-03-30 MED ORDER — ONDANSETRON HCL 4 MG PO TABS: 4.0000 mg | ORAL_TABLET | Freq: Four times a day (QID) | ORAL | Status: DC | PRN

## 2022-03-30 NOTE — Hospital Course (Signed)
Patient is a  82 year old male with past medical history of arthritis, hypertension presented to hospital for 1 week history of swelling in the right knee which was progressive in nature.  Patient was using a walker at baseline but was having difficulty ambulating so he decided to come to the ED.  In the ED, vitals were stable.  Since he had gross right knee effusion aspiration was done in the ED without any crystals and white blood cell count was 3720.  Culture showed no organisms and rare white blood cells.  Patient was given intrathecal steroids and was admitted hospital for ambulatory dysfunction gross knee swelling and pain.    Assessment/Plan  Effusion of right knee likely secondary to osteoarthritis Aspiration of the knee was performed without any evidence of infection of gout.  Orthopedics was consulted as well.  Continue ice rest elevation NSAIDs.  Patient still unable to bend or weight-bear and been having pain.  Physical therapy has seen the patient today and recommend skilled nursing facility on discharge.  Communicated with the patient's spouse at bedside who is worried about him coming home since she is not at her best health either.   Pressure ulcer stage 1 right hip Continue pressure ulcer prevention protocol.   Acute kidney injury. Continue IV fluids.  Creatinine of 1.4.   Essential hypertension. Continue to hold valsartan with AKI.   Hyperlipidemia Continue Crestor.

## 2022-03-30 NOTE — ED Notes (Signed)
Attempted to drawback via current IV, unable to draw blood back after multiple troubleshooting, IV still intact. Attempted x3 attempt at [place] IV without success. Will have IV team consult.

## 2022-03-30 NOTE — Consult Note (Signed)
Orthopedic Surgery H&P Note  Assessment: Patient is a 82 y.o. male with right knee effusion and pain.  Aspirate results not consistent with crystalline or septic arthritis.  Likely osteoarthritis flare   Plan: -No acute operative intervention -Sent aspirate for repeat laboratory evaluation -Will follow cultures, if repeat aspirate or cultures are concerning for infection would recommend operative debridement -Diet: Regular -DVT ppx: Per primary -Antibiotics: None -Weight bearing status: As tolerated -PT/OT evaluate and treat -Pain control -Dispo: Per primary   Orthopedic bedside procedure note: After discussing the risk benefits and alternatives of a right knee aspiration and cortisone injection, patient elected to proceed.  The superior lateral aspect of the right knee was prepped with alcohol based prep.  A 21-gauge needle was used to enter the knee joint under sterile technique.  25 cc of blood tinged fluid was aspirated from the joint.  The syringe was then removed with a needle left in place.  A new syringe containing 80 mg of Depo-Medrol, 1 cc of lidocaine, 1 cc of bupivacaine was attached to the needle.  This was then injected into the right knee.  Needle was withdrawn and Band-Aid was applied.  Patient tolerated procedure well.  After the procedure, patient was moving his knee through 40 degrees of range of motion without significant pain.  He felt that his knee pain had improved.  ___________________________________________________________________________   Chief complaint: Right knee pain  History:  Patient is a 82 y.o. male who has noticed 1 week of right knee pain and swelling.  There was no trauma or injury that preceded the onset of pain and swelling.  It has gotten progressively worse with time.  He normally walks with a walker but has been having trouble even doing that because of the pain.  He presented to the ER last night due to the pain.  An aspirate was performed by  the emergency department and he was admitted to the hospitalist service overnight.  He has no pain in any other joint.  No history of hemophilia.  Denies paresthesia numbness.  Review of systems: General: denies fevers and chills, myalgias Neurologic: denies recent changes in vision, slurred speech Abdomen: denies nausea, vomiting, hematemesis Respiratory: denies cough, shortness of breath  Past medical history:  HTN Pre-diabetes  Osteoarthritis  Allergies: NKDA   Past surgical history:  Left hip intramedullary nail  Bilateral knee arthroscopy Polypectomy  Social history: Denies use of nicotine-containing products (cigarettes, vaping, smokeless, etc.) Alcohol use: denies Denies use of recreational drugs   Physical Exam:  General: no acute distress, appears stated age Neurologic: alert, answering questions appropriately, following commands Cardiovascular: regular rate, no cyanosis Respiratory: unlabored breathing on room air, symmetric chest rise Psychiatric: appropriate affect, normal cadence to speech  MSK:   -Bilateral upper extremities  No tenderness to palpation over extremity, no joint swelling Fires deltoid, biceps, triceps, wrist extensors, wrist flexors, finger extensors, finger flexors  AIN/PIN/IO intact  Palpable radial pulse  Sensation intact to light touch in median/ulnar/radial/axillary nerve distributions  Hand warm and well perfused  -Left lower extremity  No tenderness to palpation over extremity, no gross deformity, no effusion at the knee or ankle Fires hip flexors, quadriceps, hamstrings, tibialis anterior, gastrocnemius and soleus, extensor hallucis longus Plantarflexes and dorsiflexes toes Sensation intact to light touch in sural, saphenous, tibial, deep peroneal, and superficial peroneal nerve distributions Foot warm and well perfused  -Right lower extremity  No tenderness to palpation over extremity, no gross deformity, no ankle  effusion  Able  to actively range 20 degrees at the knee before significant pain, able to passively take through 6 degrees range of motion before significant pain  Effusion at the knee Fires hip flexors, quadriceps, hamstrings, tibialis anterior, gastrocnemius and soleus, extensor hallucis longus Plantarflexes and dorsiflexes toes Sensation intact to light touch in sural, saphenous, tibial, deep peroneal, and superficial peroneal nerve distributions Foot warm and well perfused  Imaging: XR of the right knee from 03/29/2022 was independently reviewed and interpreted, showing tricompartmental degenerative changes that are most significant in the medial compartment.  No fracture or dislocation seen.  Knee aspirate results from the emergency department show no crystals, negative Gram stain, cell count of 3.7K   Patient name: Frank Hernandez Patient MRN: [id number] Date: 03/30/22

## 2022-03-30 NOTE — Progress Notes (Signed)
PROGRESS NOTE    Frank Hernandez  OMV:[id number] DOB: January 10, 1941 DOA: 03/29/2022 PCP: [name] (Inactive)    Brief Narrative:  Patient is a  82 year old male with past medical history of arthritis, hypertension presented to hospital for 1 week history of swelling in the right knee which was progressive in nature.  Patient was using a walker at baseline but was having difficulty ambulating so he decided to come to the ED.  In the ED, vitals were stable.  Since he had gross right knee effusion aspiration was done in the ED without any crystals and white blood cell count was 3720.  Culture showed no organisms and rare white blood cells.  Patient was then admitted to the hospital for ambulatory dysfunction, gross knee swelling and pain.    Assessment/Plan  Effusion of right knee likely secondary to osteoarthritis Aspiration of the knee was performed on 03/30/2022 without any evidence of infection of gout.  Status post cortisone injection during aspiration.  Orthopedics was consulted as well.  Continue ice rest elevation NSAIDs.  Patient still unable to bend or weight-bear and been having pain.  Physical therapy has seen the patient today and recommend skilled nursing facility on discharge.  Communicated with the patient's spouse at bedside who is worried about him coming home since she is not at her best health either.   Pressure ulcer stage 1 right hip Continue pressure ulcer prevention protocol.   Acute kidney injury. Continue IV fluids.  Creatinine of 1.4.   Essential hypertension. Continue to hold valsartan with AKI.   Hyperlipidemia Continue Crestor.      DVT prophylaxis: SCDs Start: 03/30/22 0131   Code Status:     Code Status: Full Code  Disposition: Likely to skilled nursing facility as per PT evaluation.  Status is: Observation  The patient will require care spanning > 2 midnights and should be moved to inpatient because: Ambulatory dysfunction, inability to  weight-bear, knee pain, possible need for rehabitation.   Family Communication: Spoke with the patient's spouse at bedside.  Consultants:  Orthopedics  Procedures:  Right knee joint aspiration with intra-articular steroid injection on 03/30/2022  Antimicrobials:  None  Anti-infectives (From admission, onward)    None       Subjective: Today, patient was seen and examined at bedside.  Patient complains of right knee pain and difficulty bending the knee or weightbearing.  Could not participate much with physical therapy.  Patient's spouse at bedside.  Objective: Vitals:   03/30/22 0015 03/30/22 0145 03/30/22 0414 03/30/22 0541  BP: (!) 149/94 (!) 180/92 (!) 159/101 (!) 148/86  Pulse: 98 93 98 89  Resp: '18 18 18 15  '$ Temp:   98.1 F (36.7 C) 98.8 F (37.1 C)  TempSrc:   Oral Oral  SpO2: 100% 99% 100% 99%    Intake/Output Summary (Last 24 hours) at 03/30/2022 1410 Last data filed at 03/30/2022 0900 Gross per 24 hour  Intake 340 ml  Output --  Net 340 ml   There were no vitals filed for this visit.  Physical Examination: There is no height or weight on file to calculate BMI.  General:  Average built, not in obvious distress, elderly male, HENT:   No scleral pallor or icterus noted. Oral mucosa is moist.  Chest:  Clear breath sounds.  Diminished breath sounds bilaterally. No crackles or wheezes.  CVS: S1 &S2 heard. No murmur.  Regular rate and rhythm. Abdomen: Soft, nontender, nondistended.  Bowel sounds are heard.   Extremities: Right  knee joint effusion gross with tenderness on joint line, limited range of movement of the right knee. psych: Alert, awake and oriented, normal mood CNS:  No cranial nerve deficits.  Power equal in all extremities.   Skin: Warm and dry.  No rashes noted.  Data Reviewed:   CBC: Recent Labs  Lab 03/29/22 1233 03/30/22 0331  WBC 5.0 5.1  NEUTROABS  --  3.3  HGB 9.8* 9.1*  HCT 29.6* 28.1*  MCV 94.6 94.6  PLT 198 187    Basic  Metabolic Panel: Recent Labs  Lab 03/29/22 1233 03/30/22 0331  NA 137 136  K 4.2 4.3  CL 105 110  CO2 20* 19*  GLUCOSE 97 87  BUN 33* 30*  CREATININE 1.66* 1.42*  CALCIUM 9.3 8.6*    Liver Function Tests: No results for input(s): "AST", "ALT", "ALKPHOS", "BILITOT", "PROT", "ALBUMIN" in the last 168 hours.   Radiology Studies: DG Knee Complete 4 Views Right  Result Date: 03/29/2022 CLINICAL DATA:  Chronic knee pain. EXAM: RIGHT KNEE - COMPLETE 4+ VIEW COMPARISON:  03/23/2018 for FINDINGS: Small suprapatellar joint effusion. Progressive soft tissue edema about the distal portion of the femur and knee. No acute fracture marked medial compartment joint space narrowing with marginal spur formation. Mild degenerative changes noted within the patellofemoral and lateral compartment. Sharpening of the tibial spines. Mild chondrocalcinosis. IMPRESSION: 1. Progressive soft tissue edema about the distal femur and knee. 2. Small joint effusion. 3. 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 Electronically Signed   By: Kerby Moors M.D.   On: 03/29/2022 12:59      LOS: 1 day    Flora Lipps, MD [facility] Available via Epic secure chat 7am-7pm After these hours, please refer to coverage provider listed on [email or web address] 03/30/2022, 2:10 PM

## 2022-03-30 NOTE — Progress Notes (Signed)
Physical Therapy Treatment Patient Details Name: Frank Hernandez MRN: [id number] DOB: Dec 31, 1940 Today's Date: 03/30/2022   History of Present Illness 82 year old male with past medical history of arthritis, hypertension.  Approximately 7 days ago he developed swelling in the right knee.  This has been progressive.  At baseline he uses a walker, he has been unable to walk for the past 7 days due to swelling and pain in the right knee. Knee aspirate consistent with crystalline or septic arthritis.    PT Comments    Pt received pain medication prior to this session. +2 max assist for supine to sit. Attempted sit to stand with RW, pt unable to tolerate standing. Then attempted standing with Stedy, pt able to pull up to stand from elevated bed and tolerated standing ~2 1/2 minutes for pericare.  Poor tolerance of weight bearing on RLE.  Recommendations for follow up therapy are one component of a multi-disciplinary discharge planning process, led by the attending physician.  Recommendations may be updated based on patient status, additional functional criteria and insurance authorization.  Follow Up Recommendations  Skilled nursing-short term rehab (<3 hours/day) Can patient physically be transported by private vehicle: No   Assistance Recommended at Discharge Frequent or constant Supervision/Assistance  Patient can return home with the following Two people to help with walking and/or transfers;Two people to help with bathing/dressing/bathroom;Assistance with cooking/housework;Assist for transportation;Help with stairs or ramp for entrance   Equipment Recommendations  None recommended by PT    Recommendations for Other Services       Precautions / Restrictions Precautions Precautions: Fall Precaution Comments: denies falls in past 6 months Restrictions Weight Bearing Restrictions: No     Mobility  Bed Mobility Overal bed mobility: Needs Assistance Bed Mobility: Supine to Sit      Supine to sit: HOB elevated, +2 for physical assistance, Max assist     General bed mobility comments: attempetd supine to sit with max A to support RLE and raise trunk, pt unable to come to full sit 2* pain in R knee. Pain meds requested    Transfers Overall transfer level: Needs assistance Equipment used: Ambulation equipment used Transfers: Sit to/from Stand, Bed to chair/wheelchair/BSC Sit to Stand: +2 physical assistance, Max assist, From elevated surface           General transfer comment: attempted sit to stand with RW with +2 assist, unable to tolerate standing more than ~5 seconds; used Stedy, pt was able to pull up to stand in the [place] and was transferred to recliner with Stedy. Pt stood for ~2 1/2 minutes for pericare    Ambulation/Gait                   Stairs             Wheelchair Mobility    Modified Rankin (Stroke Patients Only)       Balance                                            Cognition Arousal/Alertness: Awake/alert Behavior During Therapy: WFL for tasks assessed/performed Overall Cognitive Status: Within Functional Limits for tasks assessed  Exercises General Exercises - Lower Extremity Ankle Circles/Pumps: AROM, Both, 10 reps Quad Sets: AROM, Both, 5 reps, Supine Heel Slides: AAROM, Right, 5 reps, Supine    General Comments        Pertinent Vitals/Pain Pain Assessment Pain Assessment: 0-10 Pain Score: 8  Pain Location: R knee Pain Descriptors / Indicators: Sharp, Discomfort, Grimacing, Moaning Pain Intervention(s): Limited activity within patient's tolerance, Monitored during session, Premedicated before session, Ice applied    Home Living Family/patient expects to be discharged to:: Private residence Living Arrangements: Spouse/significant other Available Help at Discharge: Family;Available 24 hours/day Type of Home: House Home  Access: Ramped entrance       Home Layout: One level Home Equipment: Conservation officer, nature (2 wheels);Wheelchair - power      Prior Function            PT Goals (current goals can now be found in the care plan section) Acute Rehab PT Goals Patient Stated Goal: return to walking PT Goal Formulation: With patient/family Time For Goal Achievement: 04/13/22 Potential to Achieve Goals: Fair    Frequency    Min 3X/week      PT Plan Current plan remains appropriate    Co-evaluation              AM-PAC PT "6 Clicks" Mobility   Outcome Measure  Help needed turning from your back to your side while in a flat bed without using bedrails?: A Lot Help needed moving from lying on your back to sitting on the side of a flat bed without using bedrails?: Total Help needed moving to and from a bed to a chair (including a wheelchair)?: Total Help needed standing up from a chair using your arms (e.g., wheelchair or bedside chair)?: Total Help needed to walk in hospital room?: Total Help needed climbing 3-5 steps with a railing? : Total 6 Click Score: 7    End of Session Equipment Utilized During Treatment: Gait belt Activity Tolerance: Patient limited by pain Patient left: with call bell/phone within reach;with family/visitor present;in chair;with chair alarm set Nurse Communication: Mobility status;Patient requests pain meds PT Visit Diagnosis: Difficulty in walking, not elsewhere classified (R26.2);Pain Pain - Right/Left: Right Pain - part of body: Knee     Time: 9179-1505 PT Time Calculation (min) (ACUTE ONLY): 19 min  Charges:  $Therapeutic Activity: 8-22 mins                    Blondell Reveal Kistler PT 03/30/2022  Acute Rehabilitation Services  Office [phone]

## 2022-03-30 NOTE — Evaluation (Signed)
Physical Therapy Evaluation Patient Details Name: Frank Hernandez MRN: [id number] DOB: 1940/06/17 Today's Date: 03/30/2022  History of Present Illness  82 year old male with past medical history of arthritis, hypertension.  Approximately 7 days ago he developed swelling in the right knee.  This has been progressive.  At baseline he uses a walker, he has been unable to walk for the past 7 days due to swelling and pain in the right knee. Knee aspirate consistent with crystalline or septic arthritis.  Clinical Impression  Pt admitted with above diagnosis. Attempted supine to sit with max assist, pt able to advance RLE towards edge of bed with assistance, and partially elevate trunk with assistance, but could not come to full upright sitting position due to severe R knee pain. Instructed pt in RLE exercises for strengthening and ROM. Pain meds requested. He's not mobilizing well enough to DC home at present.  Pt currently with functional limitations due to the deficits listed below (see PT Problem List). Pt will benefit from skilled PT to increase their independence and safety with mobility to allow discharge to the venue listed below.          Recommendations for follow up therapy are one component of a multi-disciplinary discharge planning process, led by the attending physician.  Recommendations may be updated based on patient status, additional functional criteria and insurance authorization.  Follow Up Recommendations Skilled nursing-short term rehab (<3 hours/day) Can patient physically be transported by private vehicle: No    Assistance Recommended at Discharge Frequent or constant Supervision/Assistance  Patient can return home with the following  Two people to help with walking and/or transfers;Two people to help with bathing/dressing/bathroom;Assistance with cooking/housework;Assist for transportation;Help with stairs or ramp for entrance    Equipment Recommendations None recommended  by PT  Recommendations for Other Services       Functional Status Assessment Patient has had a recent decline in their functional status and demonstrates the ability to make significant improvements in function in a reasonable and predictable amount of time.     Precautions / Restrictions Precautions Precautions: Fall Precaution Comments: denies falls in past 6 months Restrictions Weight Bearing Restrictions: No      Mobility  Bed Mobility Overal bed mobility: Needs Assistance Bed Mobility: Supine to Sit     Supine to sit: Max assist, HOB elevated     General bed mobility comments: attempetd supine to sit with max A to support RLE and raise trunk, pt unable to come to full sit 2* pain in R knee. Pain meds requested    Transfers                        Ambulation/Gait                  Stairs            Wheelchair Mobility    Modified Rankin (Stroke Patients Only)       Balance                                             Pertinent Vitals/Pain Pain Assessment Pain Assessment: 0-10 Pain Score: 8  Pain Location: R knee Pain Descriptors / Indicators: Sharp, Discomfort, Grimacing, Moaning Pain Intervention(s): Limited activity within patient's tolerance, Monitored during session, Patient requesting pain meds-RN notified, Repositioned, Ice applied    Home  Living Family/patient expects to be discharged to:: Private residence Living Arrangements: Spouse/significant other Available Help at Discharge: Family;Available 24 hours/day Type of Home: House Home Access: Ramped entrance       Home Layout: One level Home Equipment: Conservation officer, nature (2 wheels);Wheelchair - power      Prior Function Prior Level of Function : Independent/Modified Independent             Mobility Comments: until 1 week ago walked independently with RW       Hand Dominance        Extremity/Trunk Assessment   Upper Extremity  Assessment Upper Extremity Assessment: Overall WFL for tasks assessed    Lower Extremity Assessment Lower Extremity Assessment: RLE deficits/detail RLE Deficits / Details: knee flexion AAROM ~30* limited by pain RLE: Unable to fully assess due to pain RLE Sensation: WNL       Communication   Communication: No difficulties  Cognition Arousal/Alertness: Awake/alert Behavior During Therapy: WFL for tasks assessed/performed Overall Cognitive Status: Within Functional Limits for tasks assessed                                          General Comments      Exercises General Exercises - Lower Extremity Ankle Circles/Pumps: AROM, Both, 10 reps Quad Sets: AROM, Both, 5 reps, Supine Heel Slides: AAROM, Right, 5 reps, Supine   Assessment/Plan    PT Assessment Patient needs continued PT services  PT Problem List Decreased strength;Decreased range of motion;Decreased activity tolerance;Decreased mobility;Pain       PT Treatment Interventions Gait training;Therapeutic activities;Therapeutic exercise    PT Goals (Current goals can be found in the Care Plan section)  Acute Rehab PT Goals Patient Stated Goal: return to walking PT Goal Formulation: With patient/family Time For Goal Achievement: 04/13/22 Potential to Achieve Goals: Fair    Frequency Min 3X/week     Co-evaluation               AM-PAC PT "6 Clicks" Mobility  Outcome Measure Help needed turning from your back to your side while in a flat bed without using bedrails?: A Lot Help needed moving from lying on your back to sitting on the side of a flat bed without using bedrails?: Total Help needed moving to and from a bed to a chair (including a wheelchair)?: Total Help needed standing up from a chair using your arms (e.g., wheelchair or bedside chair)?: Total Help needed to walk in hospital room?: Total Help needed climbing 3-5 steps with a railing? : Total 6 Click Score: 7    End of Session  Equipment Utilized During Treatment: Gait belt Activity Tolerance: Patient limited by pain Patient left: in bed;with bed alarm set;with call bell/phone within reach;with family/visitor present;with nursing/sitter in room Nurse Communication: Mobility status;Patient requests pain meds PT Visit Diagnosis: Difficulty in walking, not elsewhere classified (R26.2);Pain Pain - Right/Left: Right Pain - part of body: Knee    Time: 2585-2778 PT Time Calculation (min) (ACUTE ONLY): 31 min   Charges:   PT Evaluation $PT Eval Moderate Complexity: 1 Mod PT Treatments $Therapeutic Activity: 8-22 mins        .Blondell Reveal Kistler PT 03/30/2022  Acute Rehabilitation Services  Office [phone]

## 2022-03-30 NOTE — H&P (Addendum)
PCP:   [name] (Inactive)   Chief Complaint:  Right knee pain and effusion  HPI: This is a pleasant 82 year old male with past medical history of arthritis, hypertension.  Approximately 7 days ago he developed swelling in the right knee.  This has been progressive.  At baseline he uses a walker, he has been unable to walk for the past 7 days due to swelling and pain in the right knee.  He denies any fevers or chills.  He was in the ER 03/2322 with the same, today the swelling is worse.  He came to the ER.  The knee was tapped in the ER, there is no crystals in the fluid.  White blood cell count 3, 720.  Culture no organisms seen, rare white blood cells.  Review of Systems:  The patient denies anorexia, fever, weight loss,, vision loss, decreased hearing, hoarseness, chest pain, syncope, dyspnea on exertion, peripheral edema, balance deficits, hemoptysis, abdominal pain, melena, hematochezia, severe indigestion/heartburn, hematuria, incontinence, genital sores, muscle weakness, suspicious skin lesions, transient blindness, difficulty walking, depression, unusual weight change, abnormal bleeding, enlarged lymph nodes, angioedema, and breast masses. Positive: Right knee pain and swelling, inability to walk  Past Medical History: Past Medical History:  Diagnosis Date   Bleeding from the nose    Hypertension    Pre-diabetes    Past Surgical History:  Procedure Laterality Date   COLONOSCOPY     INTRAMEDULLARY (IM) NAIL INTERTROCHANTERIC Left 06/11/2020   Procedure: INTRAMEDULLARY (IM) NAIL INTERTROCHANTRIC;  Surgeon: Shona Needles, MD;  Location: [facility];  Service: Orthopedics;  Laterality: Left;   KNEE ARTHROSCOPY Bilateral    POLYPECTOMY      Medications: Prior to Admission medications   Medication Sig Start Date End Date Taking? Authorizing Provider  acetaminophen (TYLENOL) 325 MG tablet Take 2 tablets (650 mg total) by mouth every 6 (six) hours as needed for mild pain or  moderate pain. 06/24/20   Norm Parcel, PA-C  amLODipine (NORVASC) 10 MG tablet Take 10 mg by mouth daily. Patient not taking: Reported on 06/17/2021    [provider]  celecoxib (CELEBREX) 200 MG capsule Take 1 capsule (200 mg total) by mouth 2 (two) times daily. Patient not taking: Reported on 12/24/2020 06/24/20   Norm Parcel, PA-C  DOXAZOSIN MESYLATE PO Take 2 mg by mouth every evening.    [provider]  polyethylene glycol (MIRALAX / GLYCOLAX) 17 g packet Take 17 g by mouth daily as needed for mild constipation. Patient not taking: Reported on 06/17/2021 06/24/20   Norm Parcel, PA-C  rosuvastatin (CRESTOR) 20 MG tablet Take 20 mg by mouth daily. Patient not taking: Reported on 06/17/2021    [provider]  traMADol (ULTRAM) 50 MG tablet Take 1 tablet (50 mg total) by mouth every 6 (six) hours as needed. 03/23/22   Gerrit Heck, MD  valsartan-hydrochlorothiazide (DIOVAN-HCT) 160-12.5 MG tablet 1 tablet    [provider]    Allergies:  No Known Allergies  Social History:  reports that he has never smoked. He has never used smokeless tobacco. He reports that he does not drink alcohol and does not use drugs.  Family History: Family History  Problem Relation Age of Onset   Colon cancer Neg Hx     Physical Exam: Vitals:   03/29/22 2045 03/29/22 2100 03/29/22 2145 03/29/22 2314  BP: (!) 151/87 (!) 149/87 (!) 147/111 (!) 158/86  Pulse: (!) 107 (!) 104 (!) 108 (!) 103  Resp: 18  $'18 18 16  'l$ Temp:    98.4 F (36.9 C)  TempSrc:    Oral  SpO2: 100% 96% 98% 98%    General:  Alert and oriented times three, well developed and nourished, no acute distress Eyes: PERRLA, pink conjunctiva, no scleral icterus ENT: Moist oral mucosa, neck supple, no thyromegaly Lungs: clear to ascultation, no wheeze, no crackles, no use of accessory muscles Cardiovascular: regular rate and rhythm, no regurgitation, no gallops, no murmurs. No carotid bruits,  no JVD Abdomen: soft, positive BS, non-tender, non-distended, no organomegaly, not an acute abdomen GU: not examined Neuro: CN II - XII grossly intact, sensation intact Musculoskeletal: strength 5/5 all extremities, no clubbing, cyanosis or edema, Right knee very swollen, ROM decreased secondary to pain, no warmth, no erythema noted Skin: no rash, no subcutaneous crepitation, no decubitus Psych: appropriate patient   Labs on Admission:  Recent Labs    03/29/22 1233  NA 137  K 4.2  CL 105  CO2 20*  GLUCOSE 97  BUN 33*  CREATININE 1.66*  CALCIUM 9.3    Micro Results: Recent Results (from the past 240 hour(s))  Resp panel by RT-PCR (RSV, Flu A&B, Covid) Anterior Nasal Swab     Status: None   Collection Time: 03/23/22  8:45 AM   Specimen: Anterior Nasal Swab  Result Value Ref Range Status   SARS Coronavirus 2 by RT PCR NEGATIVE NEGATIVE Final    Comment: (NOTE) SARS-CoV-2 target nucleic acids are NOT DETECTED.  The SARS-CoV-2 RNA is generally detectable in upper respiratory specimens during the acute phase of infection. The lowest concentration of SARS-CoV-2 viral copies this assay can detect is 138 copies/mL. A negative result does not preclude SARS-Cov-2 infection and should not be used as the sole basis for treatment or other patient management decisions. A negative result may occur with  improper specimen collection/handling, submission of specimen other than nasopharyngeal swab, presence of viral mutation(s) within the areas targeted by this assay, and inadequate number of viral copies(<138 copies/mL). A negative result must be combined with clinical observations, patient history, and epidemiological information. The expected result is Negative.  Fact Sheet for Patients:  EntrepreneurPulse.com.au  Fact Sheet for Healthcare Providers:  IncredibleEmployment.be  This test is no t yet approved or cleared by the [place] FDA and   has been authorized for detection and/or diagnosis of SARS-CoV-2 by FDA under an Emergency Use Authorization (EUA). This EUA will remain  in effect (meaning this test can be used) for the duration of the COVID-19 declaration under Section 564(b)(1) of the Act, 21 U.S.C.section 360bbb-3(b)(1), unless the authorization is terminated  or revoked sooner.       Influenza A by PCR NEGATIVE NEGATIVE Final   Influenza B by PCR NEGATIVE NEGATIVE Final    Comment: (NOTE) The Xpert Xpress SARS-CoV-2/FLU/RSV plus assay is intended as an aid in the diagnosis of influenza from Nasopharyngeal swab specimens and should not be used as a sole basis for treatment. Nasal washings and aspirates are unacceptable for Xpert Xpress SARS-CoV-2/FLU/RSV testing.  Fact Sheet for Patients: EntrepreneurPulse.com.au  Fact Sheet for Healthcare Providers: IncredibleEmployment.be  This test is not yet approved or cleared by the [place] FDA and has been authorized for detection and/or diagnosis of SARS-CoV-2 by FDA under an Emergency Use Authorization (EUA). This EUA will remain in effect (meaning this test can be used) for the duration of the COVID-19 declaration under Section 564(b)(1) of the Act, 21 U.S.C. section 360bbb-3(b)(1), unless the authorization is terminated  or revoked.     Resp Syncytial Virus by PCR NEGATIVE NEGATIVE Final    Comment: (NOTE) Fact Sheet for Patients: EntrepreneurPulse.com.au  Fact Sheet for Healthcare Providers: IncredibleEmployment.be  This test is not yet approved or cleared by the [place] FDA and has been authorized for detection and/or diagnosis of SARS-CoV-2 by FDA under an Emergency Use Authorization (EUA). This EUA will remain in effect (meaning this test can be used) for the duration of the COVID-19 declaration under Section 564(b)(1) of the Act, 21 U.S.C. section 360bbb-3(b)(1),  unless the authorization is terminated or revoked.  Performed at [facility], [place] [address], [place], [place] [address]   Body fluid culture w Gram Stain     Status: None (Preliminary result)   Collection Time: 03/29/22  6:28 PM   Specimen: Synovium; Body Fluid  Result Value Ref Range Status   Specimen Description   Final    SYNOVIAL right knee Performed at [facility] [address] [place], [place] [address]    Special Requests   Final    NONE Performed at [facility], [place] [address], [place], [place] [address]    Gram Stain   Final    RARE WBC PRESENT, PREDOMINANTLY PMN NO ORGANISMS SEEN Performed at [facility], [place] [address] [place], [place] [address]    Culture PENDING  Incomplete   Report Status PENDING  Incomplete     Radiological Exams on Admission: DG Knee Complete 4 Views Right  Result Date: 03/29/2022 CLINICAL DATA:  Chronic knee pain. EXAM: RIGHT KNEE - COMPLETE 4+ VIEW COMPARISON:  03/23/2018 for FINDINGS: Small suprapatellar joint effusion. Progressive soft tissue edema about the distal portion of the femur and knee. No acute fracture marked medial compartment joint space narrowing with marginal spur formation. Mild degenerative changes noted within the patellofemoral and lateral compartment. Sharpening of the tibial spines. Mild chondrocalcinosis. IMPRESSION: 1. Progressive soft tissue edema about the distal femur and knee. 2. Small joint effusion. 3. 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 Electronically Signed   By: Kerby Moors M.D.   On: 03/29/2022 12:59    Assessment/Plan Present on Admission:  Effusion of right knee/osteoarthritis -Effusion secondary to arthritis.  No evidence of gout or infection. -On-call orthopedics consulted and aware -lortab PRN -Ice, rest, elevation. NSAIDs PRN after steroid injection -Admit to because patient is unable to ambulate secondary to  pain.  PT consult  Pressure ulcer stage 1 right hip -turn q2hrs  AKI -IV fluid hydration.   -BMP in a.m.   Hypertension -hold valsartan with AKI  Hyperlipidemia -Crestor resumed  Bryanda Mikel 03/30/2022, 12:41 AM

## 2022-03-30 NOTE — Care Management CC44 (Signed)
Condition Code 44 Documentation Completed  Patient Details  Name: Frank Hernandez MRN: [id number] Date of Birth: 19-Feb-1941   Condition Code 44 given:  Yes Patient signature on Condition Code 44 notice:  Yes Documentation of 2 MD's agreement:  Yes Code 44 added to claim:  Yes    Angelita Ingles, RN 03/30/2022, 12:45 PM

## 2022-03-30 NOTE — Care Management Obs Status (Signed)
[facility] NOTIFICATION   Patient Details  Name: Frank Hernandez MRN: [id number] Date of Birth: [birth year]   Medicare Observation Status Notification Given:  Yes    Angelita Ingles, RN 03/30/2022, 12:44 PM

## 2022-03-30 NOTE — Care Management Obs Status (Signed)
[facility] NOTIFICATION   Patient Details  Name: Frank Hernandez MRN: [id number] Date of Birth: 10/25/1940   Medicare Observation Status Notification Given:  Yes    Angelita Ingles, RN 03/30/2022, 12:44 PM

## 2022-03-30 NOTE — ED Notes (Signed)
ED TO INPATIENT HANDOFF REPORT  Name/Age/Gender Frank Hernandez 82 y.o. male  Code Status    Code Status Orders  (From admission, onward)           Start     Ordered   03/30/22 0131  Full code  Continuous       Question:  By:  Answer:  Consent: discussion documented in EHR   03/30/22 0130           Code Status History     Date Active Date Inactive Code Status Order ID Comments User Context   06/10/2020 2243 06/25/2020 0330 Full Code [id number]  Kinsinger, Arta Bruce, MD ED   12/26/2015 1513 12/27/2015 2120 Full Code [id number]  Radene Gunning, NP ED       Home/SNF/Other Home  Chief Complaint Effusion, right knee [M25.461]  Level of Care/Admitting Diagnosis ED Disposition     ED Disposition  Admit   Condition  --   [place]: [facility] [[id number]]  Level of Care: Med-Surg [16]  May admit patient to [facility] or [facility] if equivalent level of care is available:: Yes  Covid Evaluation: Confirmed COVID Negative  Diagnosis: Effusion, right knee [2353614]  Admitting Physician: [place], [place]  Attending Physician: Quintella Baton [[id number]]  Certification:: I certify this patient will need inpatient services for at least 2 midnights  Estimated Length of Stay: 2          Medical History Past Medical History:  Diagnosis Date   Bleeding from the nose    Hypertension    Pre-diabetes     Allergies No Known Allergies  IV Location/Drains/Wounds Patient Lines/Drains/Airways Status     Active Line/Drains/Airways     Name Placement date Placement time Site Days   Peripheral IV 03/29/22 22 G 1.75" Anterior;Right Forearm 03/29/22  2228  Forearm  1   Peripheral IV 03/30/22 22 G Right Antecubital 03/30/22  0332  Antecubital  less than 1   Pressure Injury 03/29/22 Buttocks Right Stage 1 -  Intact skin with non-blanchable redness of a localized area usually over a bony prominence. 03/29/22  2140  -- 1             Labs/Imaging Results for orders placed or performed during the hospital encounter of 03/29/22 (from the past 48 hour(s))  CBC     Status: Abnormal   Collection Time: 03/29/22 12:33 PM  Result Value Ref Range   WBC 5.0 4.0 - 10.5 K/uL   RBC 3.13 (L) 4.22 - 5.81 MIL/uL   Hemoglobin 9.8 (L) 13.0 - 17.0 g/dL   HCT 29.6 (L) 39.0 - 52.0 %   MCV 94.6 80.0 - 100.0 fL   MCH 31.3 26.0 - 34.0 pg   MCHC 33.1 30.0 - 36.0 g/dL   RDW 13.2 11.5 - 15.5 %   Platelets 198 150 - 400 K/uL   nRBC 0.0 0.0 - 0.2 %    Comment: Performed at [facility], [place] [address] [place], [place] [address]  Basic metabolic panel     Status: Abnormal   Collection Time: 03/29/22 12:33 PM  Result Value Ref Range   Sodium 137 135 - 145 mmol/L   Potassium 4.2 3.5 - 5.1 mmol/L   Chloride 105 98 - 111 mmol/L   CO2 20 (L) 22 - 32 mmol/L   Glucose, Bld 97 70 - 99 mg/dL    Comment: Glucose reference range applies only to samples taken after  fasting for at least 8 hours.   BUN 33 (H) 8 - 23 mg/dL   Creatinine, Ser 1.66 (H) 0.61 - 1.24 mg/dL   Calcium 9.3 8.9 - 10.3 mg/dL   GFR, Estimated 41 (L) >60 mL/min    Comment: (NOTE) Calculated using the CKD-EPI Creatinine Equation (2021)    Anion gap 12 5 - 15    Comment: Performed at [facility], [place] [address], [place], [place] [address]  Body fluid culture w Gram Stain     Status: None (Preliminary result)   Collection Time: 03/29/22  6:28 PM   Specimen: Synovium; Body Fluid  Result Value Ref Range   Specimen Description      SYNOVIAL right knee Performed at [place] [address] [place], [place] [address]    Special Requests      NONE Performed at [facility], [place] [address], [place], [place] [address]    Gram Stain      RARE WBC PRESENT, PREDOMINANTLY PMN NO ORGANISMS SEEN Performed at [facility], [place] [address] [place], [place] [address]    Culture PENDING     Report Status PENDING   Synovial cell count + diff, w/ crystals     Status: Abnormal   Collection Time: 03/29/22  6:28 PM  Result Value Ref Range   Color, Synovial RED (A) YELLOW   Appearance-Synovial HAZY (A) CLEAR   Crystals, Fluid NO CRYSTALS SEEN    WBC, Synovial 3,720 (H) 0 - 200 /cu mm   Neutrophil, Synovial 84 (H) 0 - 25 %   Lymphocytes-Synovial Fld 7 0 - 20 %   Monocyte-Macrophage-Synovial Fluid 9 (L) 50 - 90 %   Eosinophils-Synovial 0 0 - 1 %    Comment: Performed at [facility], [place] [address], [place], [place] [address]  Basic metabolic panel     Status: Abnormal   Collection Time: 03/30/22  3:31 AM  Result Value Ref Range   Sodium 136 135 - 145 mmol/L   Potassium 4.3 3.5 - 5.1 mmol/L   Chloride 110 98 - 111 mmol/L   CO2 19 (L) 22 - 32 mmol/L   Glucose, Bld 87 70 - 99 mg/dL    Comment: Glucose reference range applies only to samples taken after fasting for at least 8 hours.   BUN 30 (H) 8 - 23 mg/dL   Creatinine, Ser 1.42 (H) 0.61 - 1.24 mg/dL   Calcium 8.6 (L) 8.9 - 10.3 mg/dL   GFR, Estimated 50 (L) >60 mL/min    Comment: (NOTE) Calculated using the CKD-EPI Creatinine Equation (2021)    Anion gap 7 5 - 15    Comment: Performed at [facility], [place] [address] [place], [place] [address]  CBC with Differential/Platelet     Status: Abnormal   Collection Time: 03/30/22  3:31 AM  Result Value Ref Range   WBC 5.1 4.0 - 10.5 K/uL   RBC 2.97 (L) 4.22 - 5.81 MIL/uL   Hemoglobin 9.1 (L) 13.0 - 17.0 g/dL   HCT 28.1 (L) 39.0 - 52.0 %   MCV 94.6 80.0 - 100.0 fL   MCH 30.6 26.0 - 34.0 pg   MCHC 32.4 30.0 - 36.0 g/dL   RDW 13.1 11.5 - 15.5 %   Platelets 187 150 - 400 K/uL   nRBC 0.0 0.0 - 0.2 %   Neutrophils Relative % 65 %   Neutro Abs 3.3 1.7 - 7.7 K/uL  Lymphocytes Relative 14 %   Lymphs Abs 0.7 0.7 - 4.0 K/uL   Monocytes Relative 16 %   Monocytes Absolute 0.8 0.1 - 1.0 K/uL   Eosinophils Relative 4 %   Eosinophils Absolute  0.2 0.0 - 0.5 K/uL   Basophils Relative 0 %   Basophils Absolute 0.0 0.0 - 0.1 K/uL   Immature Granulocytes 1 %   Abs Immature Granulocytes 0.03 0.00 - 0.07 K/uL    Comment: Performed at [facility], [place] [address], [place], [place] [address]   DG Knee Complete 4 Views Right  Result Date: 03/29/2022 CLINICAL DATA:  Chronic knee pain. EXAM: RIGHT KNEE - COMPLETE 4+ VIEW COMPARISON:  03/23/2018 for FINDINGS: Small suprapatellar joint effusion. Progressive soft tissue edema about the distal portion of the femur and knee. No acute fracture marked medial compartment joint space narrowing with marginal spur formation. Mild degenerative changes noted within the patellofemoral and lateral compartment. Sharpening of the tibial spines. Mild chondrocalcinosis. IMPRESSION: 1. Progressive soft tissue edema about the distal femur and knee. 2. Small joint effusion. 3. 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 Electronically Signed   By: Kerby Moors M.D.   On: 03/29/2022 12:59    Pending Labs Unresulted Labs (From admission, onward)     Start     Ordered   04/06/22 0500  Creatinine, serum  (enoxaparin (LOVENOX)    CrCl >/= 30 ml/min)  Weekly,   R     Comments: while on enoxaparin therapy    03/30/22 0130   03/29/22 1828  Glucose, Body Fluid Other  (Arthrocentesis Panel)  ONCE - STAT,   URGENT        03/29/22 1827   03/29/22 1828  Protein, body fluid (other)  (Arthrocentesis Panel)  ONCE - STAT,   URGENT        03/29/22 1827   03/29/22 1828  Pathologist smear review  Once,   R        03/29/22 1828            Vitals/Pain Today's Vitals   03/29/22 2100 03/29/22 2145 03/29/22 2314 03/30/22 0015  BP: (!) 149/87 (!) 147/111 (!) 158/86 (!) 149/94  Pulse: (!) 104 (!) 108 (!) 103 98  Resp: '18 18 16 18  '$ Temp:   98.4 F (36.9 C)   TempSrc:   Oral   SpO2: 96% 98% 98% 100%  PainSc:        Isolation Precautions No active  isolations  Medications Medications  enoxaparin (LOVENOX) injection 50 mg (has no administration in time range)  acetaminophen (TYLENOL) tablet 650 mg (has no administration in time range)    Or  acetaminophen (TYLENOL) suppository 650 mg (has no administration in time range)  senna-docusate (Senokot-S) tablet 1 tablet (has no administration in time range)  ondansetron (ZOFRAN) tablet 4 mg (has no administration in time range)    Or  ondansetron (ZOFRAN) injection 4 mg (has no administration in time range)  HYDROcodone-acetaminophen (NORCO/VICODIN) 5-325 MG per tablet 1 tablet (1 tablet Oral Given 03/30/22 0301)  amLODipine (NORVASC) tablet 10 mg (has no administration in time range)  rosuvastatin (CRESTOR) tablet 20 mg (has no administration in time range)  lidocaine (PF) (XYLOCAINE) 1 % injection 10 mL (10 mLs Infiltration Given by Other 03/29/22 2145)  HYDROcodone-acetaminophen (NORCO/VICODIN) 5-325 MG per tablet 1 tablet (1 tablet Oral Given 03/29/22 1939)  lactated ringers bolus 1,000 mL (0 mLs Intravenous Stopped 03/30/22 0132)    Mobility non-ambulatory

## 2022-03-31 DIAGNOSIS — L8991 Pressure ulcer of unspecified site, stage 1: Secondary | ICD-10-CM | POA: Diagnosis not present

## 2022-03-31 DIAGNOSIS — M25461 Effusion, right knee: Secondary | ICD-10-CM | POA: Diagnosis not present

## 2022-03-31 DIAGNOSIS — I10 Essential (primary) hypertension: Secondary | ICD-10-CM | POA: Diagnosis not present

## 2022-03-31 LAB — CBC
HCT: 28.3 % — ABNORMAL LOW (ref 39.0–52.0)
Hemoglobin: 9.1 g/dL — ABNORMAL LOW (ref 13.0–17.0)
MCH: 30.7 pg (ref 26.0–34.0)
MCHC: 32.2 g/dL (ref 30.0–36.0)
MCV: 95.6 fL (ref 80.0–100.0)
Platelets: 235 10*3/uL (ref 150–400)
RBC: 2.96 MIL/uL — ABNORMAL LOW (ref 4.22–5.81)
RDW: 13 % (ref 11.5–15.5)
WBC: 6 10*3/uL (ref 4.0–10.5)
nRBC: 0 % (ref 0.0–0.2)

## 2022-03-31 LAB — BASIC METABOLIC PANEL
Anion gap: 9 (ref 5–15)
BUN: 35 mg/dL — ABNORMAL HIGH (ref 8–23)
CO2: 20 mmol/L — ABNORMAL LOW (ref 22–32)
Calcium: 9.3 mg/dL (ref 8.9–10.3)
Chloride: 104 mmol/L (ref 98–111)
Creatinine, Ser: 1.52 mg/dL — ABNORMAL HIGH (ref 0.61–1.24)
GFR, Estimated: 46 mL/min — ABNORMAL LOW (ref >60)
Glucose, Bld: 106 mg/dL — ABNORMAL HIGH (ref 70–99)
Potassium: 4.8 mmol/L (ref 3.5–5.1)
Sodium: 133 mmol/L — ABNORMAL LOW (ref 135–145)

## 2022-03-31 LAB — GLUCOSE, BODY FLUID OTHER: Glucose, Body Fluid Other: 62 mg/dL

## 2022-03-31 LAB — PROTEIN, BODY FLUID (OTHER): Total Protein, Body Fluid Other: 3.6 g/dL

## 2022-03-31 LAB — MAGNESIUM: Magnesium: 1.8 mg/dL (ref 1.7–2.4)

## 2022-03-31 MED ORDER — SODIUM CHLORIDE 0.9 % IV SOLN: INTRAVENOUS | Status: DC

## 2022-03-31 MED ORDER — DOXAZOSIN MESYLATE 1 MG PO TABS
2.0000 mg | ORAL_TABLET | Freq: Every evening | ORAL | Status: DC
  Administered 2022-03-31 – 2022-04-04 (×5): 2 mg via ORAL
  Filled 2022-03-31 (×5): qty 2

## 2022-03-31 NOTE — Discharge Instructions (Signed)
Orthopedic Surgery Discharge Instructions  Patient name: Frank Hernandez Diagnosis: right knee osteoarthritis flare  Activity: You are allowed to put as much weight on your leg as you would like. You can walk as much as you would like. You have no specific activity restrictions for your knee. You should elevate your leg above the level of the heart when sleeping at night and when you can during the day.   Driving: You should not drive while taking narcotic pain medications. You can slowly return to driving as you feel comfortable. Start in a parking lot and then drive more as you are able.   Reasons to Call the Office After Surgery: You should feel free to call the office with any concerns or questions, but you should definitely notify the office if you develop: -shortness of breath, chest pain, or trouble breathing -fevers, chills, or pain that is getting worse with each passing day -persistent nausea or vomiting -other concerns about your knee  Follow Up Appointments: You should call the office  for an appointment with Dr. Erlinda Hong or Dr. Ninfa Linden who are both hip and knee specialists.  Office Information:  -Phone number: [phone] -Address: [address]       [place], [place] [address]

## 2022-03-31 NOTE — Progress Notes (Signed)
PROGRESS NOTE    Frank Hernandez  WER:[id number] DOB: 03/25/1940 DOA: 03/29/2022 PCP: [name] (Inactive)    Brief Narrative:  Patient is a  82 year old male with past medical history of arthritis, hypertension presented to hospital for 1 week history of swelling in the right knee which was progressive in nature.  Patient was using a walker at baseline but was having difficulty ambulating so he decided to come to the ED.  In the ED, vitals were stable.  Since he had gross right knee effusion aspiration was done in the ED without any crystals and white blood cell count was 3720.  Synovial culture showed no organisms and rare white blood cells.  Patient was then admitted to the hospital for ambulatory dysfunction, gross knee swelling and pain.    Assessment/Plan  Effusion of right knee likely secondary to osteoarthritis Aspiration of the knee was performed on 03/30/2022 without any evidence of infection of gout.  Status post cortisone injection during aspiration.  Orthopedics was consulted as well.  Continue ice rest elevation NSAIDs.  Physical therapy has seen the patient and recommend skilled nursing facility on discharge.  Swelling has slightly decreased today.  Will need to continue PT reevaluation while in the hospital.   Pressure ulcer stage 1 right hip Continue pressure ulcer prevention protocol.   Acute kidney injury. Continue IV fluids.  Creatinine of 1.4.   Essential hypertension. Continue to hold valsartan with AKI.   Hyperlipidemia Continue Crestor.      DVT prophylaxis: SCDs Start: 03/30/22 0131   Code Status:     Code Status: Full Code  Disposition: Likely to skilled nursing facility as per PT evaluation.  Status is: Observation  The patient will require care spanning > 2 midnights and should be moved to inpatient because: Ambulatory dysfunction, inability to weight-bear, knee pain, possible need for rehabitation.   Family Communication: Spoke with the  patient's spouse on the phone on 03/31/22.  Consultants:  Orthopedics  Procedures:  Right knee joint aspiration with intra-articular steroid injection on 03/30/2022  Antimicrobials:  None  Anti-infectives (From admission, onward)    None       Subjective: Today, patient was seen and examined at bedside.  Denies overt pain and swelling has slightly decreased today.  Denies any fever, chills or rigor.  Objective: Vitals:   03/30/22 0541 03/30/22 1436 03/30/22 2014 03/31/22 0554  BP: (!) 148/86 138/77 (!) 154/85 (!) 148/82  Pulse: 89 95 88 87  Resp: '15 17 20 18  '$ Temp: 98.8 F (37.1 C) 98.5 F (36.9 C) 98.9 F (37.2 C) 99 F (37.2 C)  TempSrc: Oral Oral Oral Oral  SpO2: 99% 100% 100% 99%    Intake/Output Summary (Last 24 hours) at 03/31/2022 1317 Last data filed at 03/31/2022 1002 Gross per 24 hour  Intake 480 ml  Output 300 ml  Net 180 ml    There were no vitals filed for this visit.  Physical Examination: There is no height or weight on file to calculate BMI.   General:  Average built, not in obvious distress, elderly male, alert awake and Communicative. HENT:   No scleral pallor or icterus noted. Oral mucosa is moist.  Chest:  Clear breath sounds.   No crackles or wheezes.  CVS: S1 &S2 heard. No murmur.  Regular rate and rhythm. Abdomen: Soft, nontender, nondistended.  Bowel sounds are heard.   Extremities: Right knee joint effusion gross with tenderness on joint line, limited range of movement of the right knee.  psych: Alert, awake and oriented, normal mood CNS:  No cranial nerve deficits.  Power equal in all extremities.   Skin: Warm and dry.  No rashes noted.  Data Reviewed:   CBC: Recent Labs  Lab 03/29/22 1233 03/30/22 0331 03/31/22 0647  WBC 5.0 5.1 6.0  NEUTROABS  --  3.3  --   HGB 9.8* 9.1* 9.1*  HCT 29.6* 28.1* 28.3*  MCV 94.6 94.6 95.6  PLT 198 187 235     Basic Metabolic Panel: Recent Labs  Lab 03/29/22 1233 03/30/22 0331  03/31/22 0647  NA 137 136 133*  K 4.2 4.3 4.8  CL 105 110 104  CO2 20* 19* 20*  GLUCOSE 97 87 106*  BUN 33* 30* 35*  CREATININE 1.66* 1.42* 1.52*  CALCIUM 9.3 8.6* 9.3  MG  --   --  1.8     Liver Function Tests: No results for input(s): "AST", "ALT", "ALKPHOS", "BILITOT", "PROT", "ALBUMIN" in the last 168 hours.   Radiology Studies: No results found.    LOS: 1 day    Flora Lipps, MD [facility] Available via Epic secure chat 7am-7pm After these hours, please refer to coverage provider listed on [email or web address] 03/31/2022, 1:17 PM

## 2022-03-31 NOTE — TOC Progression Note (Signed)
Transition of Care [facility]) - Progression Note    Patient Details  Name: Frank Hernandez MRN: [id number] Date of Birth: 1941-02-04  Transition of Care [facility]) CM/SW [place], RN Phone Number:[phone]  03/31/2022, 12:46 PM  Clinical Narrative:    [facility] acknowledges consult for SNF placement. CM has received message that MD is requesting PT to see patient again because MD feels that patient may be ok to d/c home with home health and not need SNF. TOC will await updated therapy notes.         Expected Discharge Plan and Services                                               Social Determinants of Health (SDOH) Interventions SDOH Screenings   Tobacco Use: Low Risk  (03/29/2022)    Readmission Risk Interventions     No data to display

## 2022-04-01 ENCOUNTER — Observation Stay (HOSPITAL_COMMUNITY): Payer: Medicare PPO

## 2022-04-01 DIAGNOSIS — M25461 Effusion, right knee: Secondary | ICD-10-CM | POA: Diagnosis not present

## 2022-04-01 DIAGNOSIS — I10 Essential (primary) hypertension: Secondary | ICD-10-CM | POA: Diagnosis present

## 2022-04-01 DIAGNOSIS — L8991 Pressure ulcer of unspecified site, stage 1: Secondary | ICD-10-CM | POA: Diagnosis not present

## 2022-04-01 DIAGNOSIS — N179 Acute kidney failure, unspecified: Secondary | ICD-10-CM | POA: Diagnosis not present

## 2022-04-01 DIAGNOSIS — M25569 Pain in unspecified knee: Secondary | ICD-10-CM | POA: Insufficient documentation

## 2022-04-01 DIAGNOSIS — M25561 Pain in right knee: Secondary | ICD-10-CM | POA: Diagnosis not present

## 2022-04-01 MED ORDER — LACTATED RINGERS IV SOLN: INTRAVENOUS | Status: DC

## 2022-04-01 MED ORDER — SENNOSIDES-DOCUSATE SODIUM 8.6-50 MG PO TABS: 1.0000 | ORAL_TABLET | Freq: Every evening | ORAL | Status: DC | PRN

## 2022-04-01 MED ORDER — TRAMADOL HCL 50 MG PO TABS: 50.0000 mg | ORAL_TABLET | Freq: Three times a day (TID) | ORAL | 0 refills | Status: DC | PRN

## 2022-04-01 MED ORDER — ACETAMINOPHEN 325 MG PO TABS: 650.0000 mg | ORAL_TABLET | Freq: Four times a day (QID) | ORAL | Status: DC | PRN

## 2022-04-01 MED ORDER — LORAZEPAM 2 MG/ML IJ SOLN
1.0000 mg | Freq: Once | INTRAMUSCULAR | Status: AC | PRN
  Administered 2022-04-01: 1 mg via INTRAVENOUS
  Filled 2022-04-01: qty 1

## 2022-04-01 NOTE — Plan of Care (Signed)
Orthopedic Plan of Care  Aspirate results from both 1/30 and 1/29 show no organisms on gram stain and no growth to date. Crystals were negative. Cell count was 3.7k. No concern for septic arthritis at this time. Recommend continued symptomatic treatment. PT evaluate and treat. WBAT BLE. Will continue to follow cultures even on discharge because that would change the plan if they become positive. Please page me with any concerns/questions. Thanks.   Callie Fielding, MD Orthopedic Surgeon

## 2022-04-01 NOTE — Progress Notes (Signed)
PT Cancellation Note  Patient Details Name: Frank Hernandez MRN: [id number] DOB: June 08, 1940   Cancelled Treatment:    Reason Eval/Treat Not Completed: Patient at procedure or test/unavailable (pt at MRI, will attempt later today.)  Philomena Doheny PT 04/01/2022  [place]  Office [phone]

## 2022-04-01 NOTE — Progress Notes (Signed)
PT Cancellation Note  Patient Details Name: Frank Hernandez MRN: [id number] DOB: 1941-02-07   Cancelled Treatment:    Reason Eval/Treat Not Completed: Fatigue/lethargy limiting ability to participate (pt sleeping soundly, difficult to arouse 2* recently having Ativan prior to an MRI. Will follow.)  Philomena Doheny PT 04/01/2022  [facility] Services  Office [phone]

## 2022-04-01 NOTE — Progress Notes (Signed)
PROGRESS NOTE  Frank Hernandez:[id number] DOB: 1940-09-25   PCP: [name] (Inactive)  Patient is from: Home.  Uses walker at baseline.  DOA: 03/29/2022 LOS: 1  Chief complaints Chief Complaint  Patient presents with   Knee Pain     Brief Narrative / Interim history: 82 year old M with PMH of osteoarthritis and HTN presenting with 1 week history of progressive right knee swelling and inability to ambulate.  No recent trauma or fall.  In ED, hemodynamically stable.  Afebrile.  X-ray showed progressive soft tissue edema about the distal femur and knee, small joint effusion, advanced medial compartment osteoarthritis and chondrocalcinosis.  Orthopedic surgery consulted.  Underwent aspiration for right knee effusion with steroid injection.  Fluid analysis with 3720 WBC and no crystals.  Synovial fluid culture negative.  Therapy recommended SNF.  Patient reports improvement in his knee pain but did not appreciate significant change in swelling.  MRI of right knee ordered.  Subjective: Seen and examined earlier this morning.  No major events overnight of this morning.  Reports 6/10 pain in his right knee earlier.  Pain has improved after medication.  Did not appreciate significant improvement in his swelling or range of motion.  Denies numbness or tingling.  Denies trauma or fall.  Objective: Vitals:   03/31/22 0554 03/31/22 1331 03/31/22 2059 04/01/22 0512  BP: (!) 148/82 (!) 156/86 (!) 151/68 (!) 142/80  Pulse: 87 85 85 80  Resp: '18 20 18 18  '$ Temp: 99 F (37.2 C) 98.4 F (36.9 C) 98.9 F (37.2 C) 98.4 F (36.9 C)  TempSrc: Oral Oral Oral Oral  SpO2: 99% 100% 98% 98%    Examination:  GENERAL: No apparent distress.  Nontoxic. HEENT: MMM.  Vision and hearing grossly intact.  NECK: Supple.  No apparent JVD.  RESP:  No IWOB.  Fair aeration bilaterally. CVS:  RRR. Heart sounds normal.  ABD/GI/GU: BS+. Abd soft, NTND.  MSK/EXT:  Moves extremities.  Very limited ROM in  right knee.  Significant swelling in right knee.  Mild TTP medially.  No erythema.  No significant effusion. SKIN: no apparent skin lesion or wound NEURO: Awake, alert and oriented appropriately.  No apparent focal neuro deficit. PSYCH: Calm. Normal affect.   Procedures:  1/30-right knee arthrocentesis with steroid injection  Microbiology summarized: 1/29-right knee synovial fluid culture NGTD 1/30-right knee synovial fluid culture NGTD  Assessment and plan: Principal Problem:   Effusion of right knee Active Problems:   Hypertension   Pressure injury of skin   Effusion, right knee  Right knee effusion secondary to osteoarthritis: Still with significant swelling and limited range of motion.  Some tenderness over medial aspect.  X-ray as above. -Will obtain MRI of right knee -Appreciate input by Ortho-WBAT, -Pain control with as needed Tylenol and Norco.  Not able to use NSAID due to his poor renal function -Elevation and ice  Acute kidney injury: Unknown baseline. Cr 1.16 in 05/2020.  On Diovan-HCTZ at home. Recent Labs    03/29/22 1233 03/30/22 0331 03/31/22 0647  BUN 33* 30* 35*  CREATININE 1.66* 1.42* 1.52*  -Change NS to LR at 75 cc an hour. -Continue holding Diovan/HCTZ -Avoid nephrotoxic meds.   Essential hypertension: BP improved. -Continue home Cardura -Continue holding Diovan/HCT  Mild hyponatremia: Likely from IV fluid. -Monitor.   Hyperlipidemia -Continue Crestor.   There is no height or weight on file to calculate BMI.  Pressure skin injury: POA Pressure Injury 03/29/22 Buttocks Right Stage 1 -  Intact skin  with non-blanchable redness of a localized area usually over a bony prominence. (Active)  03/29/22 2140  Location: Buttocks  Location Orientation: Right  Staging: Stage 1 -  Intact skin with non-blanchable redness of a localized area usually over a bony prominence.  Wound Description (Comments):   Present on Admission: Yes  Dressing Type Foam -  Lift dressing to assess site every shift 04/01/22 0800   DVT prophylaxis:  SCDs Start: 03/30/22 0131  Code Status: Full code Family Communication: Updated patient's wife over the phone from patient's phone Level of care: Med-Surg Status is: Observation The patient will require care spanning > 2 midnights and should be moved to inpatient because: AKI and right knee swelling/effusion   Final disposition: SNF Consultants:  Orthopedic surgery  35 minutes with more than 50% spent in reviewing records, counseling patient/family and coordinating care.   Sch Meds:  Scheduled Meds:  doxazosin  2 mg Oral QPM   enoxaparin (LOVENOX) injection  50 mg Subcutaneous Q24H   Continuous Infusions:  sodium chloride 75 mL/hr at 04/01/22 0700   PRN Meds:.acetaminophen **OR** acetaminophen, HYDROcodone-acetaminophen, ondansetron **OR** ondansetron (ZOFRAN) IV, senna-docusate  Antimicrobials: Anti-infectives (From admission, onward)    None        I have personally reviewed the following labs and images: CBC: Recent Labs  Lab 03/29/22 1233 03/30/22 0331 03/31/22 0647  WBC 5.0 5.1 6.0  NEUTROABS  --  3.3  --   HGB 9.8* 9.1* 9.1*  HCT 29.6* 28.1* 28.3*  MCV 94.6 94.6 95.6  PLT 198 187 235   BMP &GFR Recent Labs  Lab 03/29/22 1233 03/30/22 0331 03/31/22 0647  NA 137 136 133*  K 4.2 4.3 4.8  CL 105 110 104  CO2 20* 19* 20*  GLUCOSE 97 87 106*  BUN 33* 30* 35*  CREATININE 1.66* 1.42* 1.52*  CALCIUM 9.3 8.6* 9.3  MG  --   --  1.8   Estimated Creatinine Clearance: 46.1 mL/min (A) (by C-G formula based on SCr of 1.52 mg/dL (H)). Liver & Pancreas: No results for input(s): "AST", "ALT", "ALKPHOS", "BILITOT", "PROT", "ALBUMIN" in the last 168 hours. No results for input(s): "LIPASE", "AMYLASE" in the last 168 hours. No results for input(s): "AMMONIA" in the last 168 hours. Diabetic: No results for input(s): "HGBA1C" in the last 72 hours. No results for input(s): "GLUCAP" in  the last 168 hours. Cardiac Enzymes: No results for input(s): "CKTOTAL", "CKMB", "CKMBINDEX", "TROPONINI" in the last 168 hours. No results for input(s): "PROBNP" in the last 8760 hours. Coagulation Profile: No results for input(s): "INR", "PROTIME" in the last 168 hours. Thyroid Function Tests: No results for input(s): "TSH", "T4TOTAL", "FREET4", "T3FREE", "THYROIDAB" in the last 72 hours. Lipid Profile: No results for input(s): "CHOL", "HDL", "LDLCALC", "TRIG", "CHOLHDL", "LDLDIRECT" in the last 72 hours. Anemia Panel: No results for input(s): "VITAMINB12", "FOLATE", "FERRITIN", "TIBC", "IRON", "RETICCTPCT" in the last 72 hours. Urine analysis:    Component Value Date/Time   COLORURINE YELLOW 12/26/2015 [place] 12/26/2015 1055   LABSPEC 1.012 12/26/2015 1055   PHURINE 5.5 12/26/2015 1055   GLUCOSEU NEGATIVE 12/26/2015 1055   HGBUR NEGATIVE 12/26/2015 [place] 12/26/2015 1055   KETONESUR NEGATIVE 12/26/2015 1055   PROTEINUR NEGATIVE 12/26/2015 1055   NITRITE NEGATIVE 12/26/2015 1055   LEUKOCYTESUR NEGATIVE 12/26/2015 1055   Sepsis Labs: Invalid input(s): "PROCALCITONIN", "LACTICIDVEN"  Microbiology: Recent Results (from the past 240 hour(s))  Resp panel by RT-PCR (RSV, Flu A&B, Covid) Anterior Nasal Swab  Status: None   Collection Time: 03/23/22  8:45 AM   Specimen: Anterior Nasal Swab  Result Value Ref Range Status   SARS Coronavirus 2 by RT PCR NEGATIVE NEGATIVE Final    Comment: (NOTE) SARS-CoV-2 target nucleic acids are NOT DETECTED.  The SARS-CoV-2 RNA is generally detectable in upper respiratory specimens during the acute phase of infection. The lowest concentration of SARS-CoV-2 viral copies this assay can detect is 138 copies/mL. A negative result does not preclude SARS-Cov-2 infection and should not be used as the sole basis for treatment or other patient management decisions. A negative result may occur with  improper  specimen collection/handling, submission of specimen other than nasopharyngeal swab, presence of viral mutation(s) within the areas targeted by this assay, and inadequate number of viral copies(<138 copies/mL). A negative result must be combined with clinical observations, patient history, and epidemiological information. The expected result is Negative.  Fact Sheet for Patients:  EntrepreneurPulse.com.au  Fact Sheet for Healthcare Providers:  IncredibleEmployment.be  This test is no t yet approved or cleared by the [place] FDA and  has been authorized for detection and/or diagnosis of SARS-CoV-2 by FDA under an Emergency Use Authorization (EUA). This EUA will remain  in effect (meaning this test can be used) for the duration of the COVID-19 declaration under Section 564(b)(1) of the Act, 21 U.S.C.section 360bbb-3(b)(1), unless the authorization is terminated  or revoked sooner.       Influenza A by PCR NEGATIVE NEGATIVE Final   Influenza B by PCR NEGATIVE NEGATIVE Final    Comment: (NOTE) The Xpert Xpress SARS-CoV-2/FLU/RSV plus assay is intended as an aid in the diagnosis of influenza from Nasopharyngeal swab specimens and should not be used as a sole basis for treatment. Nasal washings and aspirates are unacceptable for Xpert Xpress SARS-CoV-2/FLU/RSV testing.  Fact Sheet for Patients: EntrepreneurPulse.com.au  Fact Sheet for Healthcare Providers: IncredibleEmployment.be  This test is not yet approved or cleared by the [place] FDA and has been authorized for detection and/or diagnosis of SARS-CoV-2 by FDA under an Emergency Use Authorization (EUA). This EUA will remain in effect (meaning this test can be used) for the duration of the COVID-19 declaration under Section 564(b)(1) of the Act, 21 U.S.C. section 360bbb-3(b)(1), unless the authorization is terminated or revoked.     Resp  Syncytial Virus by PCR NEGATIVE NEGATIVE Final    Comment: (NOTE) Fact Sheet for Patients: EntrepreneurPulse.com.au  Fact Sheet for Healthcare Providers: IncredibleEmployment.be  This test is not yet approved or cleared by the [place] FDA and has been authorized for detection and/or diagnosis of SARS-CoV-2 by FDA under an Emergency Use Authorization (EUA). This EUA will remain in effect (meaning this test can be used) for the duration of the COVID-19 declaration under Section 564(b)(1) of the Act, 21 U.S.C. section 360bbb-3(b)(1), unless the authorization is terminated or revoked.  Performed at [facility], [place] [address] [place], [place] [address]   Body fluid culture w Gram Stain     Status: None (Preliminary result)   Collection Time: 03/29/22  6:28 PM   Specimen: Synovium; Body Fluid  Result Value Ref Range Status   Specimen Description   Final    SYNOVIAL right knee Performed at [facility] [address] [place], [place] [address]    Special Requests   Final    NONE Performed at [facility], [place] [address], [place],  [address]    Gram Stain   Final  RARE WBC PRESENT, PREDOMINANTLY PMN NO ORGANISMS SEEN    Culture   Final    NO GROWTH 3 DAYS Performed at [facility] [address] [place], [place] [address]    Report Status PENDING  Incomplete  Body fluid culture w Gram Stain     Status: None (Preliminary result)   Collection Time: 03/30/22  7:15 AM   Specimen: KNEE; Body Fluid  Result Value Ref Range Status   Specimen Description   Final    KNEE Performed at [facility] [address], [place], [place] [address]    Special Requests   Final    NONE Performed at [facility], [place] [address], [place], [place] [address]    Gram Stain   Final    RARE WBC PRESENT,BOTH PMN AND MONONUCLEAR NO ORGANISMS SEEN     Culture   Final    NO GROWTH 2 DAYS Performed at [facility], [place] [address] [place], [place] [address]    Report Status PENDING  Incomplete    Radiology Studies: No results found.    Crandall Harvel T. [place]  If 7PM-7AM, please contact night-coverage [email or web address] 04/01/2022, 11:31 AM

## 2022-04-02 DIAGNOSIS — Z7401 Bed confinement status: Secondary | ICD-10-CM | POA: Diagnosis not present

## 2022-04-02 DIAGNOSIS — E871 Hypo-osmolality and hyponatremia: Secondary | ICD-10-CM

## 2022-04-02 DIAGNOSIS — R7303 Prediabetes: Secondary | ICD-10-CM | POA: Diagnosis present

## 2022-04-02 DIAGNOSIS — N179 Acute kidney failure, unspecified: Secondary | ICD-10-CM

## 2022-04-02 DIAGNOSIS — M11261 Other chondrocalcinosis, right knee: Secondary | ICD-10-CM | POA: Diagnosis present

## 2022-04-02 DIAGNOSIS — Z79899 Other long term (current) drug therapy: Secondary | ICD-10-CM | POA: Diagnosis not present

## 2022-04-02 DIAGNOSIS — D631 Anemia in chronic kidney disease: Secondary | ICD-10-CM | POA: Diagnosis present

## 2022-04-02 DIAGNOSIS — R638 Other symptoms and signs concerning food and fluid intake: Secondary | ICD-10-CM

## 2022-04-02 DIAGNOSIS — M1711 Unilateral primary osteoarthritis, right knee: Secondary | ICD-10-CM | POA: Diagnosis present

## 2022-04-02 DIAGNOSIS — E785 Hyperlipidemia, unspecified: Secondary | ICD-10-CM | POA: Diagnosis present

## 2022-04-02 DIAGNOSIS — L89311 Pressure ulcer of right buttock, stage 1: Secondary | ICD-10-CM | POA: Diagnosis present

## 2022-04-02 DIAGNOSIS — L8991 Pressure ulcer of unspecified site, stage 1: Secondary | ICD-10-CM | POA: Diagnosis not present

## 2022-04-02 DIAGNOSIS — M25461 Effusion, right knee: Secondary | ICD-10-CM | POA: Diagnosis not present

## 2022-04-02 DIAGNOSIS — Z6832 Body mass index (BMI) 32.0-32.9, adult: Secondary | ICD-10-CM | POA: Diagnosis not present

## 2022-04-02 DIAGNOSIS — L89211 Pressure ulcer of right hip, stage 1: Secondary | ICD-10-CM | POA: Diagnosis present

## 2022-04-02 DIAGNOSIS — I129 Hypertensive chronic kidney disease with stage 1 through stage 4 chronic kidney disease, or unspecified chronic kidney disease: Secondary | ICD-10-CM | POA: Diagnosis present

## 2022-04-02 DIAGNOSIS — N1831 Chronic kidney disease, stage 3a: Secondary | ICD-10-CM | POA: Diagnosis present

## 2022-04-02 DIAGNOSIS — I10 Essential (primary) hypertension: Secondary | ICD-10-CM | POA: Diagnosis not present

## 2022-04-02 DIAGNOSIS — E669 Obesity, unspecified: Secondary | ICD-10-CM | POA: Diagnosis present

## 2022-04-02 LAB — RETICULOCYTES
Immature Retic Fract: 33.7 % — ABNORMAL HIGH (ref 2.3–15.9)
RBC.: 2.76 MIL/uL — ABNORMAL LOW (ref 4.22–5.81)
Retic Count, Absolute: 68.7 10*3/uL (ref 19.0–186.0)
Retic Ct Pct: 2.5 % (ref 0.4–3.1)

## 2022-04-02 LAB — IRON AND TIBC
Iron: 37 ug/dL — ABNORMAL LOW (ref 45–182)
Saturation Ratios: 15 % — ABNORMAL LOW (ref 17.9–39.5)
TIBC: 251 ug/dL (ref 250–450)
UIBC: 214 ug/dL

## 2022-04-02 LAB — BODY FLUID CULTURE W GRAM STAIN
Culture: NO GROWTH
Culture: NO GROWTH

## 2022-04-02 LAB — FERRITIN: Ferritin: 165 ng/mL (ref 24–336)

## 2022-04-02 LAB — RENAL FUNCTION PANEL
Albumin: 2.7 g/dL — ABNORMAL LOW (ref 3.5–5.0)
Anion gap: 7 (ref 5–15)
BUN: 28 mg/dL — ABNORMAL HIGH (ref 8–23)
CO2: 23 mmol/L (ref 22–32)
Calcium: 9.4 mg/dL (ref 8.9–10.3)
Chloride: 105 mmol/L (ref 98–111)
Creatinine, Ser: 1.48 mg/dL — ABNORMAL HIGH (ref 0.61–1.24)
GFR, Estimated: 48 mL/min — ABNORMAL LOW (ref >60)
Glucose, Bld: 89 mg/dL (ref 70–99)
Phosphorus: 2.7 mg/dL (ref 2.5–4.6)
Potassium: 4.7 mmol/L (ref 3.5–5.1)
Sodium: 135 mmol/L (ref 135–145)

## 2022-04-02 LAB — CBC
HCT: 25 % — ABNORMAL LOW (ref 39.0–52.0)
Hemoglobin: 8.5 g/dL — ABNORMAL LOW (ref 13.0–17.0)
MCH: 31.5 pg (ref 26.0–34.0)
MCHC: 34 g/dL (ref 30.0–36.0)
MCV: 92.6 fL (ref 80.0–100.0)
Platelets: 242 10*3/uL (ref 150–400)
RBC: 2.7 MIL/uL — ABNORMAL LOW (ref 4.22–5.81)
RDW: 13 % (ref 11.5–15.5)
WBC: 5.7 10*3/uL (ref 4.0–10.5)
nRBC: 0 % (ref 0.0–0.2)

## 2022-04-02 LAB — VITAMIN B12: Vitamin B-12: 426 pg/mL (ref 180–914)

## 2022-04-02 LAB — FOLATE: Folate: 8.6 ng/mL (ref >5.9)

## 2022-04-02 LAB — MAGNESIUM: Magnesium: 1.9 mg/dL (ref 1.7–2.4)

## 2022-04-02 MED ORDER — ENSURE ENLIVE PO LIQD
237.0000 mL | Freq: Two times a day (BID) | ORAL | Status: DC
  Administered 2022-04-03 – 2022-04-05 (×6): 237 mL via ORAL

## 2022-04-02 MED ORDER — TAB-A-VITE/IRON PO TABS
1.0000 | ORAL_TABLET | Freq: Every day | ORAL | Status: DC
  Administered 2022-04-02 – 2022-04-05 (×4): 1 via ORAL
  Filled 2022-04-02 (×4): qty 1

## 2022-04-02 NOTE — Progress Notes (Addendum)
Physical Therapy Treatment Patient Details Name: Frank Hernandez MRN: [id number] DOB: [birth year] Today's Date: 04/02/2022   History of Present Illness 82 year old male with past medical history of arthritis, hypertension.  Approximately 7 days ago he developed swelling in the right knee.  This has been progressive.  At baseline he uses a walker, he has been unable to walk for the past 7 days due to swelling and pain in the right knee. Knee aspirate consistent with crystalline or septic arthritis.    PT Comments    Pt notes decreased swelling in R knee, but movement of RLE remains painful. He requires max assist of 2 for supine to sit and for sit to stand. In standing in [place], pt was unable to fully weight shift onto RLE in order to lift LLE. Pt performed RLE ROM/strengthening exercises with assistance. He is tolerating increased knee flexion AAROM to ~60*. Encouraged pt to remove pillow from under his knee in order to facilitate full knee extension ROM. SNF recommended.     Recommendations for follow up therapy are one component of a multi-disciplinary discharge planning process, led by the attending physician.  Recommendations may be updated based on patient status, additional functional criteria and insurance authorization.  Follow Up Recommendations  Skilled nursing-short term rehab (<3 hours/day) Can patient physically be transported by private vehicle: No   Assistance Recommended at Discharge Frequent or constant Supervision/Assistance  Patient can return home with the following Two people to help with walking and/or transfers;Two people to help with bathing/dressing/bathroom;Assistance with cooking/housework;Assist for transportation;Help with stairs or ramp for entrance   Equipment Recommendations  None recommended by PT    Recommendations for Other Services       Precautions / Restrictions Precautions Precautions: Fall Precaution Comments: denies falls in past 6  months Restrictions Weight Bearing Restrictions: No     Mobility  Bed Mobility Overal bed mobility: Needs Assistance Bed Mobility: Supine to Sit     Supine to sit: HOB elevated, +2 for physical assistance, Max assist     General bed mobility comments: assist to raise trunk and support RLE (pt 30%)    Transfers Overall transfer level: Needs assistance Equipment used: Ambulation equipment used Transfers: Sit to/from Stand, Bed to chair/wheelchair/BSC Sit to Stand: +2 physical assistance, Max assist, From elevated surface           General transfer comment: pt stood from elevated bed pulling up on bar of Stedy, pt able to stand for ~2 1/2 minutes for pericare. In standing pt was unable to fully  weight shift onto RLE in order to lift LLE 2* pain. Transfer via Lift Equipment: St[place]  Ambulation/Gait                   Stairs             Wheelchair Mobility    Modified Rankin (Stroke Patients Only)       Balance Overall balance assessment: Needs assistance         Standing balance support: Bilateral upper extremity supported Standing balance-Leahy Scale: Poor                              Cognition Arousal/Alertness: Awake/alert Behavior During Therapy: WFL for tasks assessed/performed Overall Cognitive Status: Within Functional Limits for tasks assessed  Exercises General Exercises - Lower Extremity Ankle Circles/Pumps: AROM, Both, 10 reps Quad Sets: AROM, Both, 5 reps, Supine Short Arc Quad: AAROM, Right, 10 reps, Supine Long Arc Quad: AAROM, Right, 10 reps, Supine Heel Slides: AAROM, Right, 5 reps, Supine    General Comments        Pertinent Vitals/Pain Pain Assessment Pain Score: 5  Pain Location: R knee Pain Descriptors / Indicators: Sharp, Discomfort, Grimacing, Moaning Pain Intervention(s): Limited activity within patient's tolerance, Monitored during session,  Premedicated before session, Repositioned    Home Living                          Prior Function            PT Goals (current goals can now be found in the care plan section) Acute Rehab PT Goals Patient Stated Goal: return to walking PT Goal Formulation: With patient/family Time For Goal Achievement: 04/13/22 Potential to Achieve Goals: Fair Progress towards PT goals: Progressing toward goals    Frequency    Min 2X/week      PT Plan Current plan remains appropriate    Co-evaluation              AM-PAC PT "6 Clicks" Mobility   Outcome Measure  Help needed turning from your back to your side while in a flat bed without using bedrails?: A Lot Help needed moving from lying on your back to sitting on the side of a flat bed without using bedrails?: Total Help needed moving to and from a bed to a chair (including a wheelchair)?: Total Help needed standing up from a chair using your arms (e.g., wheelchair or bedside chair)?: Total Help needed to walk in hospital room?: Total Help needed climbing 3-5 steps with a railing? : Total 6 Click Score: 7    End of Session Equipment Utilized During Treatment: Gait belt Activity Tolerance: Patient limited by pain Patient left: with call bell/phone within reach;with family/visitor present;in chair Nurse Communication: Mobility status;Need for lift equipment PT Visit Diagnosis: Difficulty in walking, not elsewhere classified (R26.2);Pain Pain - Right/Left: Right Pain - part of body: Knee     Time: 1311-1330 PT Time Calculation (min) (ACUTE ONLY): 19 min  Charges:  $Therapeutic Activity: 8-22 mins                     Blondell Reveal Kistler PT 04/02/2022  Acute Rehabilitation Services  Office [phone]

## 2022-04-02 NOTE — NC FL2 (Signed)
[place] LEVEL OF CARE FORM     IDENTIFICATION  Patient Name: Frank Hernandez Birthdate: 07/31/1940 Sex: male Admission Date (Current Location): 03/29/2022  [facility] and [place] Number:  Herbalist and Address:  [facility],  [place], [place]      Provider Number: [id number]  Attending Physician Name and Address:  Mercy Riding, MD  Relative Name and Phone Number:  Taeshaun Rames [phone]    Current Level of Care: Hospital Recommended Level of Care: [place] Prior Approval Number:    Date Approved/Denied:   PASRR Number: [id number]  Discharge Plan: SNF    Current Diagnoses: Patient Active Problem List   Diagnosis Date Noted   Decreased oral intake 04/02/2022   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   AKI (acute kidney injury) ([place]) 04/02/2022   Essential hypertension 04/01/2022   Pain in joint, lower leg 04/01/2022   Effusion of right knee 03/30/2022   Pressure injury of skin 03/30/2022   Effusion, right knee 03/30/2022   Concussion with unknown loss of consciousness status 12/24/2020   Hypokalemia    Rib fractures 06/10/2020   Severe essential hypertension 11/15/2018   Syncope 12/26/2015   Pre-diabetes 12/26/2015    Orientation RESPIRATION BLADDER Height & Weight     Self, Time, Situation, Place  Tracheostomy Continent, External catheter Weight:   Height:     BEHAVIORAL SYMPTOMS/MOOD NEUROLOGICAL BOWEL NUTRITION STATUS     (n/a) Continent Diet  AMBULATORY STATUS COMMUNICATION OF NEEDS Skin   Total Care (unable to tolerate standing for ambulation) Verbally PU Stage and Appropriate Care (stage 1 right buttocks foam dressing)                       Personal Care Assistance Level of Assistance  Bathing, Feeding, Dressing Bathing Assistance: Maximum assistance Feeding assistance: Limited assistance (set up only) Dressing Assistance: Maximum assistance     Functional  Limitations Info  Sight, Hearing, Speech Sight Info: Impaired Hearing Info: Adequate Speech Info: Adequate    SPECIAL CARE FACTORS FREQUENCY  PT (By licensed PT), OT (By licensed OT)     PT Frequency: 5x/wk OT Frequency: 5x/wk            Contractures Contractures Info: Not present    Additional Factors Info  Code Status, Allergies, Psychotropic, Insulin Sliding Scale, Isolation Precautions, Suctioning Needs Code Status Info: Full Allergies Info: No known allergies Psychotropic Info: see discharge summary Insulin Sliding Scale Info: see discharge summary Isolation Precautions Info: n/a Suctioning Needs: n/a   Current Medications (04/02/2022):  This is the current hospital active medication list Current Facility-Administered Medications  Medication Dose Route Frequency Provider Last Rate Last Admin   acetaminophen (TYLENOL) tablet 650 mg  650 mg Oral Q6H PRN Crosley, Debby, MD       Or   acetaminophen (TYLENOL) suppository 650 mg  650 mg Rectal Q6H PRN Crosley, Debby, MD       doxazosin (CARDURA) tablet 2 mg  2 mg Oral QPM Pokhrel, Laxman, MD   2 mg at 04/01/22 1945   enoxaparin (LOVENOX) injection 50 mg  50 mg Subcutaneous Q24H Crosley, Debby, MD   50 mg at 04/02/22 1224   HYDROcodone-acetaminophen (NORCO/VICODIN) 5-325 MG per tablet 1 tablet  1 tablet Oral Q6H PRN Quintella Baton, MD   1 tablet at 04/02/22 1149   lactated ringers infusion   Intravenous Continuous Wendee Beavers T, MD 75 mL/hr at 04/02/22 1000 Infusion  Verify at 04/02/22 1000   multivitamins with iron tablet 1 tablet  1 tablet Oral Daily Gonfa, Taye T, MD       ondansetron (ZOFRAN) tablet 4 mg  4 mg Oral Q6H PRN Crosley, Debby, MD       Or   ondansetron (ZOFRAN) injection 4 mg  4 mg Intravenous Q6H PRN Crosley, Debby, MD   4 mg at 04/01/22 0005   senna-docusate (Senokot-S) tablet 1 tablet  1 tablet Oral QHS PRN Quintella Baton, MD         Discharge Medications: Please see discharge summary for a list of  discharge medications.  Relevant Imaging Results:  Relevant Lab Results:   Additional Information SS# [id number]  Angelita Ingles, RN

## 2022-04-02 NOTE — Progress Notes (Signed)
PROGRESS NOTE  Frank Hernandez WJX:[id number] DOB: Jul 20, 1940   PCP: [name] (Inactive)  Patient is from: Home.  Uses walker at baseline.  DOA: 03/29/2022 LOS: 1  Chief complaints Chief Complaint  Patient presents with   Knee Pain     Brief Narrative / Interim history: 82 year old M with PMH of osteoarthritis and HTN presenting with 1 week history of progressive right knee swelling and inability to ambulate.  No recent trauma or fall.  In ED, hemodynamically stable.  Afebrile.  X-ray showed progressive soft tissue edema about the distal femur and knee, small joint effusion, advanced medial compartment osteoarthritis and chondrocalcinosis.  Orthopedic surgery consulted.  Underwent aspiration for right knee effusion with steroid injection.  Fluid analysis with 3720 WBC and no crystals.  Synovial fluid culture negative.    MRI of right knee obtained and showed small complex nonspecific knee joint effusion, heterogeneous T2 hyperintensity throughout the distal vastus lateralis muscle with surrounding subcutaneous edema, laxity of patellar tendon and tricompartmental degenerative change and post meniscectomy changes or chronic degenerative tearing of the medial meniscus.   Patient has significant improvement in swelling and pain.  Remains on IV fluid due to poor p.o. intake.  Therapy recommended SNF.  Subjective: Seen and examined earlier this morning.  No major events overnight of this morning.  Reports improvement in his pain and swelling.  He is not a great historian.  He prefers I talk to his wife over the phone.  Objective: Vitals:   04/01/22 0512 04/01/22 1337 04/01/22 1945 04/02/22 0436  BP: (!) 142/80 133/72 (!) 144/76 (!) 144/73  Pulse: 80 69 75 70  Resp: '18 20 18 18  '$ Temp: 98.4 F (36.9 C) 98.2 F (36.8 C) 97.6 F (36.4 C) 98.3 F (36.8 C)  TempSrc: Oral Oral  Oral  SpO2: 98% 98% 97% 98%    Examination:  GENERAL: No apparent distress.  Nontoxic. HEENT: MMM.   Vision and hearing grossly intact.  NECK: Supple.  No apparent JVD.  RESP:  No IWOB.  Fair aeration bilaterally. CVS:  RRR. Heart sounds normal.  ABD/GI/GU: BS+. Abd soft, NTND.  MSK/EXT:  Moves extremities.  Limited ROM in right knee but improved.  Swelling in right knee but improved.  Mild TTP medially.  No erythema or significant effusion on exam. SKIN: no apparent skin lesion or wound NEURO: Awake and alert. Oriented appropriately.  No apparent focal neuro deficit. PSYCH: Calm. Normal affect.   Procedures:  1/30-right knee arthrocentesis with steroid injection  Microbiology summarized: 1/29-right knee synovial fluid culture NGTD 1/30-right knee synovial fluid culture NGTD  Assessment and plan: Principal Problem:   Effusion of right knee Active Problems:   Essential hypertension   Pressure injury of skin   Effusion, right knee   Decreased oral intake   Osteoarthritis of right knee   AKI (acute kidney injury) ([place])  Right knee effusion secondary to osteoarthritis: S/p aspiration on 1/29 and 1/30.  No crystals in synovial fluid.  Synovial fluid cultures NGTD.  Low suspicion for infection.  X-ray and MRI as above.  Swelling and pain has improved. -Appreciate input by Ortho-WBAT, -Pain control with as needed Tylenol and Norco.  Not able to use NSAID due to his poor renal function -Elevation and ice -Continue PT/OT-recommended SNF.  Acute kidney injury: Cr 1.16 in 05/2020.  Likely due to poor p.o. intake.  He was also on Diovan-HCTZ at home. Recent Labs    03/29/22 1233 03/30/22 0331 03/31/22 0647 04/02/22 0514  BUN  33* 30* 35* 28*  CREATININE 1.66* 1.42* 1.52* 1.48*  -Continue LR at 75 cc an hour due to poor p.o. intake. -Continue holding Diovan/HCTZ -Avoid nephrotoxic meds. -Encourage oral intake.   Essential hypertension: BP improved. -Continue home Cardura -Continue holding Diovan/HCT  Normocytic anemia: Slight drop in Hgb likely dilutional from IV fluid.  Anemia  panel with mild iron deficiency Recent Labs    03/29/22 1233 03/30/22 0331 03/31/22 0647 04/02/22 0514  HGB 9.8* 9.1* 9.1* 8.5*  -Continue monitoring.  Mild hyponatremia: Resolved. -Monitor.   Hyperlipidemia -Continue Crestor.   Decreased oral intake: ate 25% of BF and 25% of lunch and nothing for dinner yesterday.  Reportedly ate about 50% of his breakfast today.  -Add multivitamin. -Consult dietitian -Continue IV fluid for hydration  There is no height or weight on file to calculate BMI.  Pressure skin injury: POA Pressure Injury 03/29/22 Buttocks Right Stage 1 -  Intact skin with non-blanchable redness of a localized area usually over a bony prominence. (Active)  03/29/22 2140  Location: Buttocks  Location Orientation: Right  Staging: Stage 1 -  Intact skin with non-blanchable redness of a localized area usually over a bony prominence.  Wound Description (Comments):   Present on Admission: Yes  Dressing Type Foam - Lift dressing to assess site every shift 04/02/22 0842   DVT prophylaxis:  SCDs Start: 03/30/22 0131  Code Status: Full code Family Communication: Updated patient's wife over the phone from patient's phone Level of care: Med-Surg Status is: Observation The patient will require care spanning > 2 midnights and should be moved to inpatient because: AKI, decreased oral intake and SNF bed   Final disposition: SNF Consultants:  Orthopedic surgery  35 minutes with more than 50% spent in reviewing records, counseling patient/family and coordinating care.   Sch Meds:  Scheduled Meds:  doxazosin  2 mg Oral QPM   enoxaparin (LOVENOX) injection  50 mg Subcutaneous Q24H   multivitamins with iron  1 tablet Oral Daily   Continuous Infusions:  lactated ringers 75 mL/hr at 04/02/22 1000   PRN Meds:.acetaminophen **OR** acetaminophen, HYDROcodone-acetaminophen, ondansetron **OR** ondansetron (ZOFRAN) IV, senna-docusate  Antimicrobials: Anti-infectives (From  admission, onward)    None        I have personally reviewed the following labs and images: CBC: Recent Labs  Lab 03/29/22 1233 03/30/22 0331 03/31/22 0647 04/02/22 0514  WBC 5.0 5.1 6.0 5.7  NEUTROABS  --  3.3  --   --   HGB 9.8* 9.1* 9.1* 8.5*  HCT 29.6* 28.1* 28.3* 25.0*  MCV 94.6 94.6 95.6 92.6  PLT 198 187 235 242   BMP &GFR Recent Labs  Lab 03/29/22 1233 03/30/22 0331 03/31/22 0647 04/02/22 0514  NA 137 136 133* 135  K 4.2 4.3 4.8 4.7  CL 105 110 104 105  CO2 20* 19* 20* 23  GLUCOSE 97 87 106* 89  BUN 33* 30* 35* 28*  CREATININE 1.66* 1.42* 1.52* 1.48*  CALCIUM 9.3 8.6* 9.3 9.4  MG  --   --  1.8 1.9  PHOS  --   --   --  2.7   Estimated Creatinine Clearance: 47.4 mL/min (A) (by C-G formula based on SCr of 1.48 mg/dL (H)). Liver & Pancreas: Recent Labs  Lab 04/02/22 0514  ALBUMIN 2.7*   No results for input(s): "LIPASE", "AMYLASE" in the last 168 hours. No results for input(s): "AMMONIA" in the last 168 hours. Diabetic: No results for input(s): "HGBA1C" in the last 72 hours. No  results for input(s): "GLUCAP" in the last 168 hours. Cardiac Enzymes: No results for input(s): "CKTOTAL", "CKMB", "CKMBINDEX", "TROPONINI" in the last 168 hours. No results for input(s): "PROBNP" in the last 8760 hours. Coagulation Profile: No results for input(s): "INR", "PROTIME" in the last 168 hours. Thyroid Function Tests: No results for input(s): "TSH", "T4TOTAL", "FREET4", "T3FREE", "THYROIDAB" in the last 72 hours. Lipid Profile: No results for input(s): "CHOL", "HDL", "LDLCALC", "TRIG", "CHOLHDL", "LDLDIRECT" in the last 72 hours. Anemia Panel: Recent Labs    04/02/22 0514  VITAMINB12 426  FOLATE 8.6  FERRITIN 165  TIBC 251  IRON 37*  RETICCTPCT 2.5   Urine analysis:    Component Value Date/Time   COLORURINE YELLOW 12/26/2015 [place] 12/26/2015 1055   LABSPEC 1.012 12/26/2015 1055   PHURINE 5.5 12/26/2015 1055   GLUCOSEU NEGATIVE  12/26/2015 1055   HGBUR NEGATIVE 12/26/2015 [place] 12/26/2015 1055   KETONESUR NEGATIVE 12/26/2015 1055   PROTEINUR NEGATIVE 12/26/2015 1055   NITRITE NEGATIVE 12/26/2015 1055   LEUKOCYTESUR NEGATIVE 12/26/2015 1055   Sepsis Labs: Invalid input(s): "PROCALCITONIN", "LACTICIDVEN"  Microbiology: Recent Results (from the past 240 hour(s))  Body fluid culture w Gram Stain     Status: None   Collection Time: 03/29/22  6:28 PM   Specimen: Synovium; Body Fluid  Result Value Ref Range Status   Specimen Description   Final    SYNOVIAL right knee Performed at [facility] [address], [place], [place] [address]    Special Requests   Final    NONE Performed at [facility], [place] [address], [place], [place] [address]    Gram Stain   Final    RARE WBC PRESENT, PREDOMINANTLY PMN NO ORGANISMS SEEN    Culture   Final    NO GROWTH Performed at [facility], [place] [address] [place], [place] [address]    Report Status 04/02/2022 FINAL  Final  Body fluid culture w Gram Stain     Status: None   Collection Time: 03/30/22  7:15 AM   Specimen: KNEE; Body Fluid  Result Value Ref Range Status   Specimen Description   Final    KNEE Performed at [facility] [address] [place], [place] [address]    Special Requests   Final    NONE Performed at [facility], [place] [address] [place], [place] [address]    Gram Stain   Final    RARE WBC PRESENT,BOTH PMN AND MONONUCLEAR NO ORGANISMS SEEN    Culture   Final    NO GROWTH Performed at [facility], [place] [address] [place], [place] [address]    Report Status 04/02/2022 FINAL  Final    Radiology Studies: No results found.    Khamani Daniely T. Sturgis  If 7PM-7AM, please contact night-coverage [email or web address] 04/02/2022, 11:46 AM

## 2022-04-02 NOTE — Progress Notes (Signed)
Initial Nutrition Assessment  DOCUMENTATION CODES:   Not applicable  INTERVENTION:  - Liberalize to Regular diet.  - Ensure Plus High Protein po BID, each supplement provides 350 kcal and 20 grams of protein. - Encourage intake. - Daily multivitamin to support intake.  - Monitor weight trends.    NUTRITION DIAGNOSIS:   Increased nutrient needs related to acute illness as evidenced by estimated needs.  GOAL:   Patient will meet greater than or equal to 90% of their needs  MONITOR:   PO intake, Supplement acceptance, Weight trends  REASON FOR ASSESSMENT:   Consult Poor PO  ASSESSMENT:   82 year old M with PMH of osteoarthritis and HTN presenting with 1 week history of progressive right knee swelling and inability to ambulate.  Patient had just finished with PT at time of visit, sitting in bedside chair. He reports a UBW of 234# and denied recent changes in weight. No weight taken this admission but weighed of 226# recorded on 1/23.  Patient reports he eats very well at home. Wife elaborated that patient will usually have two meals a day with a snack in between at lunch. Occasionally drinks 1 Ensure but not every day. Appetite very good at baseline.  Patient admits his current appetite is decreased. Despite this, he has been trying to eat something at all 3 meals. Patient is documented to be consuming 25-100% of meals since admission, average of 59%. Wife has been encouraging intake at all meals and of fluids.  Discussed importance of adequate nutrition during admission. Patient is agreeable to receive Ensure to support intake. A chocolate Ensure was provided to patient during visit per his request.   Medications reviewed and include: MVI  Labs reviewed:  Creatinine 1.48   NUTRITION - FOCUSED PHYSICAL EXAM:  Flowsheet Row Most Recent Value  Orbital Region No depletion  Upper Arm Region No depletion  Thoracic and Lumbar Region No depletion  Buccal Region No  depletion  Temple Region Mild depletion  Clavicle Bone Region No depletion  Clavicle and Acromion Bone Region No depletion  Scapular Bone Region Unable to assess  Dorsal Hand No depletion  Patellar Region No depletion  Anterior Thigh Region No depletion  Posterior Calf Region No depletion  Edema (RD Assessment) Mild  Hair Reviewed  Eyes Reviewed  Mouth Reviewed  Skin Reviewed  Nails Reviewed       Diet Order:   Diet Order             Diet regular Room service appropriate? Yes; Fluid consistency: Thin  Diet effective now           Diet - low sodium heart healthy                   EDUCATION NEEDS:  Education needs have been addressed  Skin:  Skin Assessment: Skin Integrity Issues: Skin Integrity Issues:: Stage I Stage I: R buttocks  Last BM:  2/1  Height:  Ht Readings from Last 1 Encounters:  03/23/22 5' 9.5" (1.765 m)   Weight:  Wt Readings from Last 1 Encounters:  03/23/22 102.5 kg    BMI:  There is no height or weight on file to calculate BMI.  Estimated Nutritional Needs:  Kcal:  9030-0923 kcals Protein:  90-110 grams Fluid:  >/= 2.1L    Samson Frederic RD, LDN For contact information, refer to [facility].

## 2022-04-02 NOTE — TOC Initial Note (Addendum)
Transition of Care [facility]) - Initial/Assessment Note    Patient Details  Name: Frank Hernandez MRN: [id number] Date of Birth: November 17, 1940  Transition of Care [facility]) CM/SW Contact:    Angelita Ingles, RN Phone Number:[phone]  04/02/2022, 12:38 PM  Clinical Narrative:                 [facility] consulted for SNF placement. CM at bedside to make patient aware of recommendation. Patient states that he is agreeable to SNF and understands that he needs therapy. Patient states that he is from home with his spouse where he normally functions independently. Patient states that he has been getting weaker with swelling in his knee causing pain when he attempts to ambulate. Patient states that he has previously had a PCP but does not follow up on a regular basis. Patient states wife will have more information. Patient states that he does not have any DME but is active with [facility] for Home Health services.  Patient gives CM permission to initiate a SNF bed search. Wife is currently not at bedside but patient expects her to come in shortly. CM has left medicare.gov SNF list and CM name and number for follow up. FL2 completed , PASRR # [id number]. Patient info has been faxed out for bed offers. CM will follow up with wife.   1521 Bed offered given to wife. Wife would like [facility]. Message has been sent to [facility] with [facility].   [facility] is able to accept the patient but not before Monday. Insurance auth initiated  [place] [id number] can be found under [facility]. CM will follow up for placement on Monday.   Expected Discharge Plan: Skilled Nursing Facility Barriers to Discharge: SNF Pending bed offer   Patient Goals and CMS Choice Patient states their goals for this hospitalization and ongoing recovery are:: Wants to get stronger to go home CMS Medicare.gov Compare Post Acute Care list provided to:: Patient Represenative (must comment) (spouse) Choice offered to / list presented to :  Patient [place] ownership interest in [facility].provided to:: Patient    Expected Discharge Plan and Services In-house Referral: NA Discharge Planning Services: CM Consult Post Acute Care Choice: [place]   Expected Discharge Date: 04/02/22               DME Arranged: N/A DME Agency: NA         HH Agency: NA        Prior Living Arrangements/Services   Lives with:: Spouse Patient language and need for interpreter reviewed:: Yes Do you feel safe going back to the place where you live?: Yes      Need for Family Participation in Patient Care: Yes (Comment) Care giver support system in place?: Yes (comment) Current home services: Home OT, Home PT Criminal Activity/Legal Involvement Pertinent to Current Situation/Hospitalization: No - Comment as needed  Activities of Daily Living      Permission Sought/Granted Permission sought to share information with : Family Supports Permission granted to share information with : No              Emotional Assessment Appearance:: Appears stated age Attitude/Demeanor/Rapport: Gracious Affect (typically observed): Accepting Orientation: : Oriented to Self, Oriented to Place, Oriented to  Time, Oriented to Situation Alcohol / Substance Use: Not Applicable Psych Involvement: No (comment)  Admission diagnosis:  AKI (acute kidney injury) ([place]) [N17.9] Acute pain of right knee [M25.561] Effusion, right knee [M25.461] Effusion of right knee [M25.461]  Patient Active Problem List   Diagnosis Date Noted   Decreased oral intake 04/02/2022   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   AKI (acute kidney injury) ([place]) 04/02/2022   Essential hypertension 04/01/2022   Pain in joint, lower leg 04/01/2022   Effusion of right knee 03/30/2022   Pressure injury of skin 03/30/2022   Effusion, right knee 03/30/2022   Concussion with unknown loss of consciousness status 12/24/2020   Hypokalemia    Rib fractures  06/10/2020   Severe essential hypertension 11/15/2018   Syncope 12/26/2015   Pre-diabetes 12/26/2015   PCP:  [name] (Inactive) Pharmacy:   [facility] [place] - [address] [place] ([place]) [place] [address] Phone: [phone] Fax: [phone]     Social Determinants of Health (SDOH) Social History: SDOH Screenings   Tobacco Use: Low Risk  (03/29/2022)   SDOH Interventions:     Readmission Risk Interventions    04/02/2022   11:43 AM  Readmission Risk Prevention Plan  Post Dischage Appt Complete  Medication Screening Complete  Transportation Screening Complete

## 2022-04-03 DIAGNOSIS — L8991 Pressure ulcer of unspecified site, stage 1: Secondary | ICD-10-CM | POA: Diagnosis not present

## 2022-04-03 DIAGNOSIS — N1831 Chronic kidney disease, stage 3a: Secondary | ICD-10-CM | POA: Insufficient documentation

## 2022-04-03 DIAGNOSIS — R638 Other symptoms and signs concerning food and fluid intake: Secondary | ICD-10-CM | POA: Diagnosis not present

## 2022-04-03 DIAGNOSIS — E669 Obesity, unspecified: Secondary | ICD-10-CM

## 2022-04-03 DIAGNOSIS — M25461 Effusion, right knee: Secondary | ICD-10-CM | POA: Diagnosis not present

## 2022-04-03 DIAGNOSIS — I10 Essential (primary) hypertension: Secondary | ICD-10-CM | POA: Diagnosis not present

## 2022-04-03 HISTORY — DX: Chronic kidney disease, stage 3a: N18.31

## 2022-04-03 LAB — CBC
HCT: 24.6 % — ABNORMAL LOW (ref 39.0–52.0)
Hemoglobin: 8.4 g/dL — ABNORMAL LOW (ref 13.0–17.0)
MCH: 31.8 pg (ref 26.0–34.0)
MCHC: 34.1 g/dL (ref 30.0–36.0)
MCV: 93.2 fL (ref 80.0–100.0)
Platelets: 233 10*3/uL (ref 150–400)
RBC: 2.64 MIL/uL — ABNORMAL LOW (ref 4.22–5.81)
RDW: 13.1 % (ref 11.5–15.5)
WBC: 6.1 10*3/uL (ref 4.0–10.5)
nRBC: 0 % (ref 0.0–0.2)

## 2022-04-03 LAB — RENAL FUNCTION PANEL
Albumin: 2.6 g/dL — ABNORMAL LOW (ref 3.5–5.0)
Anion gap: 6 (ref 5–15)
BUN: 25 mg/dL — ABNORMAL HIGH (ref 8–23)
CO2: 24 mmol/L (ref 22–32)
Calcium: 9 mg/dL (ref 8.9–10.3)
Chloride: 104 mmol/L (ref 98–111)
Creatinine, Ser: 1.51 mg/dL — ABNORMAL HIGH (ref 0.61–1.24)
GFR, Estimated: 46 mL/min — ABNORMAL LOW (ref >60)
Glucose, Bld: 102 mg/dL — ABNORMAL HIGH (ref 70–99)
Phosphorus: 2.5 mg/dL (ref 2.5–4.6)
Potassium: 4.3 mmol/L (ref 3.5–5.1)
Sodium: 134 mmol/L — ABNORMAL LOW (ref 135–145)

## 2022-04-03 LAB — MAGNESIUM: Magnesium: 1.5 mg/dL — ABNORMAL LOW (ref 1.7–2.4)

## 2022-04-03 MED ORDER — ENSURE ENLIVE PO LIQD: 237.0000 mL | Freq: Two times a day (BID) | ORAL | 12 refills | Status: DC

## 2022-04-03 MED ORDER — FERROUS SULFATE 325 (65 FE) MG PO TBEC: 325.0000 mg | DELAYED_RELEASE_TABLET | Freq: Two times a day (BID) | ORAL | 1 refills | Status: DC

## 2022-04-03 MED ORDER — MAGNESIUM SULFATE 2 GM/50ML IV SOLN
2.0000 g | Freq: Once | INTRAVENOUS | Status: AC
  Administered 2022-04-03: 2 g via INTRAVENOUS
  Filled 2022-04-03: qty 50

## 2022-04-03 MED ORDER — TAB-A-VITE/IRON PO TABS: 1.0000 | ORAL_TABLET | Freq: Every day | ORAL | 0 refills | Status: DC

## 2022-04-03 NOTE — Progress Notes (Signed)
PROGRESS NOTE  Frank Hernandez JME:[id number] DOB: 05/18/1940   PCP: [name] (Inactive)  Patient is from: Home.  Uses walker at baseline.  DOA: 03/29/2022 LOS: 2  Chief complaints Chief Complaint  Patient presents with   Knee Pain     Brief Narrative / Interim history: 82 year old M with PMH of osteoarthritis and HTN presenting with 1 week history of progressive right knee swelling and inability to ambulate.  No recent trauma or fall.  In ED, hemodynamically stable.  Afebrile.  X-ray showed progressive soft tissue edema about the distal femur and knee, small joint effusion, advanced medial compartment osteoarthritis and chondrocalcinosis.  Orthopedic surgery consulted.  Underwent aspiration for right knee effusion with steroid injection.  Fluid analysis with 3720 WBC and no crystals.  Synovial fluid culture negative.    MRI of right knee obtained and showed small complex nonspecific knee joint effusion, heterogeneous T2 hyperintensity throughout the distal vastus lateralis muscle with surrounding subcutaneous edema, laxity of patellar tendon and tricompartmental degenerative change and post meniscectomy changes or chronic degenerative tearing of the medial meniscus.   Patient has significant improvement in swelling and pain.  Therapy recommended SNF.  Medically stable for discharge.  Subjective: Seen and examined earlier this morning.  No major events overnight of this morning.  No complaints.  Reports improvement in his pain and knee swelling.  Objective: Vitals:   04/02/22 1335 04/02/22 1955 04/03/22 0000 04/03/22 0339  BP: 123/76 135/71  (!) 148/73  Pulse: 93 84  76  Resp: '17 18  18  '$ Temp: 98.1 F (36.7 C) 98 F (36.7 C)  98.4 F (36.9 C)  TempSrc: Oral Oral  Oral  SpO2: 100% 98%  99%  Weight:   102.5 kg   Height:   5' 9.5" (1.765 m)     Examination:  GENERAL: No apparent distress.  Nontoxic. HEENT: MMM.  Vision and hearing grossly intact.  NECK: Supple.  No  apparent JVD.  RESP:  No IWOB.  Fair aeration bilaterally. CVS:  RRR. Heart sounds normal.  ABD/GI/GU: BS+. Abd soft, NTND.  MSK/EXT:  Moves extremities.  Improved swelling, ROM and tenderness in right knee.  No erythema SKIN: no apparent skin lesion or wound NEURO: Awake and alert. Oriented appropriately.  No apparent focal neuro deficit. PSYCH: Calm. Normal affect.   Procedures:  1/30-right knee arthrocentesis with steroid injection  Microbiology summarized: 1/29-right knee synovial fluid culture NGTD 1/30-right knee synovial fluid culture NGTD  Assessment and plan: Principal Problem:   Effusion of right knee Active Problems:   Essential hypertension   Pressure injury of skin   Effusion, right knee   Decreased oral intake   Osteoarthritis of right knee   Chronic kidney disease, stage 3a (HCC)  Right knee effusion secondary to osteoarthritis: S/p aspiration on 1/29 and 1/30.  No crystals in synovial fluid.  Synovial fluid cultures NGTD.  Low suspicion for infection.  X-ray and MRI as above.  Swelling and pain has improved. -Appreciate input by Ortho-WBAT, -Pain control with as needed Tylenol and Norco.  Not able to use NSAID due to his poor renal function -Elevation and ice -Continue PT/OT-recommended SNF.  CKD-3A: AKI ruled out: Cr 1.16 in 05/2020 but no interval value.  No improvement with IV fluid and holding Diovan/HCT Recent Labs    03/29/22 1233 03/30/22 0331 03/31/22 0647 04/02/22 0514 04/03/22 0742  BUN 33* 30* 35* 28* 25*  CREATININE 1.66* 1.42* 1.52* 1.48* 1.51*  -Monitor off IV fluids. -Continue holding Diovan/HCTZ -Avoid  nephrotoxic meds. -Encourage oral intake.   Essential hypertension: BP improved. -Continue home Cardura -Continue holding Diovan/HCT -Discontinue IV fluid  Normocytic anemia: Slight drop in Hgb likely dilutional from IV fluid.  Anemia panel with mild iron deficiency Recent Labs    03/29/22 1233 03/30/22 0331 03/31/22 0647  04/02/22 0514 04/03/22 0742  HGB 9.8* 9.1* 9.1* 8.5* 8.4*  -Continue monitoring. -P.o. iron on discharge  Mild hyponatremia: Resolved. -Monitor.   Hypomagnesemia -Monitor replenish as appropriate  Hyperlipidemia -Continue Crestor.   Decreased oral intake: Ate less than 25% of his breakfast this morning.  However, he had a bottle of Ensure Nutrition Problem: Increased nutrient needs Etiology: acute illness Signs/Symptoms: estimated needs Interventions: Ensure Enlive (each supplement provides 350kcal and 20 grams of protein), MVI, Refer to RD note for recommendations   Obesity Body mass index is 32.9 kg/m.  Pressure skin injury: POA Pressure Injury 03/29/22 Buttocks Right Stage 1 -  Intact skin with non-blanchable redness of a localized area usually over a bony prominence. (Active)  03/29/22 2140  Location: Buttocks  Location Orientation: Right  Staging: Stage 1 -  Intact skin with non-blanchable redness of a localized area usually over a bony prominence.  Wound Description (Comments):   Present on Admission: Yes  Dressing Type Foam - Lift dressing to assess site every shift 04/02/22 2000   DVT prophylaxis:  SCDs Start: 03/30/22 0131  Code Status: Full code Family Communication: None at bedside. Level of care: Med-Surg Status is: Inpatient The patient will remain inpatient because: SNF bed   Final disposition: SNF Consultants:  Orthopedic surgery  35 minutes with more than 50% spent in reviewing records, counseling patient/family and coordinating care.   Sch Meds:  Scheduled Meds:  doxazosin  2 mg Oral QPM   enoxaparin (LOVENOX) injection  50 mg Subcutaneous Q24H   feeding supplement  237 mL Oral BID BM   multivitamins with iron  1 tablet Oral Daily   Continuous Infusions:  magnesium sulfate bolus IVPB     PRN Meds:.acetaminophen **OR** acetaminophen, HYDROcodone-acetaminophen, ondansetron **OR** ondansetron (ZOFRAN) IV,  senna-docusate  Antimicrobials: Anti-infectives (From admission, onward)    None        I have personally reviewed the following labs and images: CBC: Recent Labs  Lab 03/29/22 1233 03/30/22 0331 03/31/22 0647 04/02/22 0514 04/03/22 0742  WBC 5.0 5.1 6.0 5.7 6.1  NEUTROABS  --  3.3  --   --   --   HGB 9.8* 9.1* 9.1* 8.5* 8.4*  HCT 29.6* 28.1* 28.3* 25.0* 24.6*  MCV 94.6 94.6 95.6 92.6 93.2  PLT 198 187 235 242 233   BMP &GFR Recent Labs  Lab 03/29/22 1233 03/30/22 0331 03/31/22 0647 04/02/22 0514 04/03/22 0742  NA 137 136 133* 135 134*  K 4.2 4.3 4.8 4.7 4.3  CL 105 110 104 105 104  CO2 20* 19* 20* 23 24  GLUCOSE 97 87 106* 89 102*  BUN 33* 30* 35* 28* 25*  CREATININE 1.66* 1.42* 1.52* 1.48* 1.51*  CALCIUM 9.3 8.6* 9.3 9.4 9.0  MG  --   --  1.8 1.9 1.5*  PHOS  --   --   --  2.7 2.5   Estimated Creatinine Clearance: 46.4 mL/min (A) (by C-G formula based on SCr of 1.51 mg/dL (H)). Liver & Pancreas: Recent Labs  Lab 04/02/22 0514 04/03/22 0742  ALBUMIN 2.7* 2.6*   No results for input(s): "LIPASE", "AMYLASE" in the last 168 hours. No results for input(s): "AMMONIA" in the last  168 hours. Diabetic: No results for input(s): "HGBA1C" in the last 72 hours. No results for input(s): "GLUCAP" in the last 168 hours. Cardiac Enzymes: No results for input(s): "CKTOTAL", "CKMB", "CKMBINDEX", "TROPONINI" in the last 168 hours. No results for input(s): "PROBNP" in the last 8760 hours. Coagulation Profile: No results for input(s): "INR", "PROTIME" in the last 168 hours. Thyroid Function Tests: No results for input(s): "TSH", "T4TOTAL", "FREET4", "T3FREE", "THYROIDAB" in the last 72 hours. Lipid Profile: No results for input(s): "CHOL", "HDL", "LDLCALC", "TRIG", "CHOLHDL", "LDLDIRECT" in the last 72 hours. Anemia Panel: Recent Labs    04/02/22 0514  VITAMINB12 426  FOLATE 8.6  FERRITIN 165  TIBC 251  IRON 37*  RETICCTPCT 2.5   Urine analysis:     Component Value Date/Time   COLORURINE YELLOW 12/26/2015 [place] 12/26/2015 1055   LABSPEC 1.012 12/26/2015 1055   PHURINE 5.5 12/26/2015 1055   GLUCOSEU NEGATIVE 12/26/2015 1055   HGBUR NEGATIVE 12/26/2015 [place] 12/26/2015 1055   KETONESUR NEGATIVE 12/26/2015 1055   PROTEINUR NEGATIVE 12/26/2015 1055   NITRITE NEGATIVE 12/26/2015 1055   LEUKOCYTESUR NEGATIVE 12/26/2015 1055   Sepsis Labs: Invalid input(s): "PROCALCITONIN", "LACTICIDVEN"  Microbiology: Recent Results (from the past 240 hour(s))  Body fluid culture w Gram Stain     Status: None   Collection Time: 03/29/22  6:28 PM   Specimen: Synovium; Body Fluid  Result Value Ref Range Status   Specimen Description   Final    SYNOVIAL right knee Performed at [facility] [address], [place], [place] [address]    Special Requests   Final    NONE Performed at [facility], [place] [address] [place], [place] [address]    Gram Stain   Final    RARE WBC PRESENT, PREDOMINANTLY PMN NO ORGANISMS SEEN    Culture   Final    NO GROWTH Performed at [facility], [place] [address] [place], [place] [address]    Report Status 04/02/2022 FINAL  Final  Body fluid culture w Gram Stain     Status: None   Collection Time: 03/30/22  7:15 AM   Specimen: KNEE; Body Fluid  Result Value Ref Range Status   Specimen Description   Final    KNEE Performed at [facility] [address], [place], [place] [address]    Special Requests   Final    NONE Performed at [facility], [place] [address], [place], [place] [address]    Gram Stain   Final    RARE WBC PRESENT,BOTH PMN AND MONONUCLEAR NO ORGANISMS SEEN    Culture   Final    NO GROWTH Performed at [facility], [place] [address], [place], [place] [address]    Report Status 04/02/2022 FINAL  Final    Radiology Studies: No results found.    Stepfanie Yott T.  [place]  If 7PM-7AM, please contact night-coverage [email or web address] 04/03/2022, 12:34 PM

## 2022-04-03 NOTE — Plan of Care (Signed)
Orthopedic Plan of Care  Final cultures from both aspiration show no organisms. There were no crystals and cell count <10k. Low suspicion for crystalline or septic arthritis. MRI showed a nonspecific fluid collection near the vastus lateralis. Aspiration and injection were done from a superolateral approach with the patient laying down in bed so I suspect that is what this fluid collection is due to. Recommend pain control and weight bearing as tolerated. Discharge follow up information placed into the discharge instructions. If any questions/concerns, please page orthopedics. Thanks.  Callie Fielding, MD Orthopedic Surgeon

## 2022-04-04 DIAGNOSIS — M25461 Effusion, right knee: Secondary | ICD-10-CM | POA: Diagnosis not present

## 2022-04-04 DIAGNOSIS — I10 Essential (primary) hypertension: Secondary | ICD-10-CM | POA: Diagnosis not present

## 2022-04-04 DIAGNOSIS — R638 Other symptoms and signs concerning food and fluid intake: Secondary | ICD-10-CM | POA: Diagnosis not present

## 2022-04-04 DIAGNOSIS — L8991 Pressure ulcer of unspecified site, stage 1: Secondary | ICD-10-CM | POA: Diagnosis not present

## 2022-04-04 LAB — CBC
HCT: 26.9 % — ABNORMAL LOW (ref 39.0–52.0)
Hemoglobin: 8.7 g/dL — ABNORMAL LOW (ref 13.0–17.0)
MCH: 30.9 pg (ref 26.0–34.0)
MCHC: 32.3 g/dL (ref 30.0–36.0)
MCV: 95.4 fL (ref 80.0–100.0)
Platelets: 262 10*3/uL (ref 150–400)
RBC: 2.82 MIL/uL — ABNORMAL LOW (ref 4.22–5.81)
RDW: 13.3 % (ref 11.5–15.5)
WBC: 6.7 10*3/uL (ref 4.0–10.5)
nRBC: 0 % (ref 0.0–0.2)

## 2022-04-04 LAB — RENAL FUNCTION PANEL
Albumin: 2.7 g/dL — ABNORMAL LOW (ref 3.5–5.0)
Anion gap: 6 (ref 5–15)
BUN: 22 mg/dL (ref 8–23)
CO2: 24 mmol/L (ref 22–32)
Calcium: 9.1 mg/dL (ref 8.9–10.3)
Chloride: 105 mmol/L (ref 98–111)
Creatinine, Ser: 1.37 mg/dL — ABNORMAL HIGH (ref 0.61–1.24)
GFR, Estimated: 52 mL/min — ABNORMAL LOW (ref >60)
Glucose, Bld: 92 mg/dL (ref 70–99)
Phosphorus: 2.9 mg/dL (ref 2.5–4.6)
Potassium: 4.5 mmol/L (ref 3.5–5.1)
Sodium: 135 mmol/L (ref 135–145)

## 2022-04-04 LAB — MAGNESIUM: Magnesium: 2 mg/dL (ref 1.7–2.4)

## 2022-04-04 NOTE — Progress Notes (Signed)
PROGRESS NOTE  Frank Hernandez JSH:[id number] DOB: 1940-08-15   PCP: [name] (Inactive)  Patient is from: Home.  Uses walker at baseline.  DOA: 03/29/2022 LOS: 3  Chief complaints Chief Complaint  Patient presents with   Knee Pain     Brief Narrative / Interim history: 82 year old M with PMH of osteoarthritis and HTN presenting with 1 week history of progressive right knee swelling and inability to ambulate.  No recent trauma or fall.  In ED, hemodynamically stable.  Afebrile.  X-ray showed progressive soft tissue edema about the distal femur and knee, small joint effusion, advanced medial compartment osteoarthritis and chondrocalcinosis.  Orthopedic surgery consulted.  Underwent aspiration for right knee effusion with steroid injection.  Fluid analysis with 3720 WBC and no crystals.  Synovial fluid culture negative.    MRI of right knee obtained and showed small complex nonspecific knee joint effusion, heterogeneous T2 hyperintensity throughout the distal vastus lateralis muscle with surrounding subcutaneous edema, laxity of patellar tendon and tricompartmental degenerative change and post meniscectomy changes or chronic degenerative tearing of the medial meniscus.   Patient has significant improvement in swelling and pain.  Therapy recommended SNF.  Medically stable for discharge.  Subjective: Seen and examined earlier this morning.  No major events overnight of this morning.  No complaints other than some pain in right knee but improving.  Objective: Vitals:   04/03/22 0339 04/03/22 1416 04/03/22 2020 04/04/22 0418  BP: (!) 148/73 (!) 150/76 137/67 (!) 140/68  Pulse: 76 80 79 70  Resp: '18 18 18 18  '$ Temp: 98.4 F (36.9 C) 98.6 F (37 C) 98.5 F (36.9 C) 98.9 F (37.2 C)  TempSrc: Oral Oral Oral Oral  SpO2: 99% 99% 99% 99%  Weight:      Height:        Examination  GENERAL: No apparent distress.  Nontoxic. HEENT: MMM.  Vision and hearing grossly intact.  NECK:  Supple.  No apparent JVD.  RESP:  No IWOB.  Fair aeration bilaterally. CVS:  RRR. Heart sounds normal.  ABD/GI/GU: BS+. Abd soft, NTND.  MSK/EXT: Improved swelling in right knee.  Still with limited ROM remaining right knee.  No erythema.  No tenderness. SKIN: no apparent skin lesion or wound NEURO: Awake and alert. Oriented appropriately.  No apparent focal neuro deficit. PSYCH: Calm. Normal affect.   Procedures:  1/30-right knee arthrocentesis with steroid injection  Microbiology summarized: 1/29-right knee synovial fluid culture NGTD 1/30-right knee synovial fluid culture NGTD  Assessment and plan: Principal Problem:   Effusion of right knee Active Problems:   Essential hypertension   Pressure injury of skin   Effusion, right knee   Decreased oral intake   Osteoarthritis of right knee   Chronic kidney disease, stage 3a (HCC)  Right knee effusion secondary to osteoarthritis: S/p aspiration on 1/29 and 1/30.  No crystals in synovial fluid.  Synovial fluid cultures NGTD.  Low suspicion for infection.  X-ray and MRI as above.  Swelling and pain has improved. -Appreciate input by Ortho-WBAT, -Pain control with as needed Tylenol and Norco.  Not able to use NSAID due to his poor renal function -Elevation and ice -Continue PT/OT-recommended SNF.  CKD-3A: AKI ruled out: Cr 1.16 in 05/2020 but no interval value.  No improvement with IV fluid and holding Diovan/HCT Recent Labs    03/29/22 1233 03/30/22 0331 03/31/22 0647 04/02/22 0514 04/03/22 0742 04/04/22 0642  BUN 33* 30* 35* 28* 25* 22  CREATININE 1.66* 1.42* 1.52* 1.48* 1.51* 1.37*  -  Improving of IV fluid. -Continue holding Diovan/HCTZ -Avoid nephrotoxic meds. -Encourage oral intake. -Recheck in the morning   Essential hypertension: BP improved. -Continue home Cardura -Continue holding Diovan/HCT  Normocytic anemia: Slight drop in Hgb likely dilutional from IV fluid.  Anemia panel with mild iron deficiency Recent  Labs    03/29/22 1233 03/30/22 0331 03/31/22 0647 04/02/22 0514 04/03/22 0742 04/04/22 0642  HGB 9.8* 9.1* 9.1* 8.5* 8.4* 8.7*  -Continue monitoring. -P.o. iron on discharge  Mild hyponatremia: Resolved. -Monitor.   Hypomagnesemia: Resolved -Monitor replenish as appropriate  Hyperlipidemia -Continue Crestor.   Decreased oral intake: Ate less than 25% of his breakfast this morning.  However, he had a bottle of Ensure Nutrition Problem: Increased nutrient needs Etiology: acute illness Signs/Symptoms: estimated needs Interventions: Ensure Enlive (each supplement provides 350kcal and 20 grams of protein), MVI, Refer to RD note for recommendations   Obesity Body mass index is 32.9 kg/m.  Pressure skin injury: POA Pressure Injury 03/29/22 Buttocks Right Stage 1 -  Intact skin with non-blanchable redness of a localized area usually over a bony prominence. (Active)  03/29/22 2140  Location: Buttocks  Location Orientation: Right  Staging: Stage 1 -  Intact skin with non-blanchable redness of a localized area usually over a bony prominence.  Wound Description (Comments):   Present on Admission: Yes  Dressing Type Foam - Lift dressing to assess site every shift 04/02/22 2000   DVT prophylaxis:  SCDs Start: 03/30/22 0131  Code Status: Full code Family Communication: Updated patient's wife from patient's phone Level of care: Med-Surg Status is: Inpatient The patient will remain inpatient because: SNF bed   Final disposition: SNF Consultants:  Orthopedic surgery  35 minutes with more than 50% spent in reviewing records, counseling patient/family and coordinating care.   Sch Meds:  Scheduled Meds:  doxazosin  2 mg Oral QPM   enoxaparin (LOVENOX) injection  50 mg Subcutaneous Q24H   feeding supplement  237 mL Oral BID BM   multivitamins with iron  1 tablet Oral Daily   Continuous Infusions:   PRN Meds:.acetaminophen **OR** acetaminophen, HYDROcodone-acetaminophen,  ondansetron **OR** ondansetron (ZOFRAN) IV, senna-docusate  Antimicrobials: Anti-infectives (From admission, onward)    None        I have personally reviewed the following labs and images: CBC: Recent Labs  Lab 03/30/22 0331 03/31/22 0647 04/02/22 0514 04/03/22 0742 04/04/22 0642  WBC 5.1 6.0 5.7 6.1 6.7  NEUTROABS 3.3  --   --   --   --   HGB 9.1* 9.1* 8.5* 8.4* 8.7*  HCT 28.1* 28.3* 25.0* 24.6* 26.9*  MCV 94.6 95.6 92.6 93.2 95.4  PLT 187 235 242 233 262   BMP &GFR Recent Labs  Lab 03/30/22 0331 03/31/22 0647 04/02/22 0514 04/03/22 0742 04/04/22 0642  NA 136 133* 135 134* 135  K 4.3 4.8 4.7 4.3 4.5  CL 110 104 105 104 105  CO2 19* 20* '23 24 24  '$ GLUCOSE 87 106* 89 102* 92  BUN 30* 35* 28* 25* 22  CREATININE 1.42* 1.52* 1.48* 1.51* 1.37*  CALCIUM 8.6* 9.3 9.4 9.0 9.1  MG  --  1.8 1.9 1.5* 2.0  PHOS  --   --  2.7 2.5 2.9   Estimated Creatinine Clearance: 51.2 mL/min (A) (by C-G formula based on SCr of 1.37 mg/dL (H)). Liver & Pancreas: Recent Labs  Lab 04/02/22 0514 04/03/22 0742 04/04/22 0642  ALBUMIN 2.7* 2.6* 2.7*   No results for input(s): "LIPASE", "AMYLASE" in the last 168 hours.  No results for input(s): "AMMONIA" in the last 168 hours. Diabetic: No results for input(s): "HGBA1C" in the last 72 hours. No results for input(s): "GLUCAP" in the last 168 hours. Cardiac Enzymes: No results for input(s): "CKTOTAL", "CKMB", "CKMBINDEX", "TROPONINI" in the last 168 hours. No results for input(s): "PROBNP" in the last 8760 hours. Coagulation Profile: No results for input(s): "INR", "PROTIME" in the last 168 hours. Thyroid Function Tests: No results for input(s): "TSH", "T4TOTAL", "FREET4", "T3FREE", "THYROIDAB" in the last 72 hours. Lipid Profile: No results for input(s): "CHOL", "HDL", "LDLCALC", "TRIG", "CHOLHDL", "LDLDIRECT" in the last 72 hours. Anemia Panel: Recent Labs    04/02/22 0514  VITAMINB12 426  FOLATE 8.6  FERRITIN 165  TIBC 251   IRON 37*  RETICCTPCT 2.5   Urine analysis:    Component Value Date/Time   COLORURINE YELLOW 12/26/2015 [place] 12/26/2015 1055   LABSPEC 1.012 12/26/2015 1055   PHURINE 5.5 12/26/2015 1055   GLUCOSEU NEGATIVE 12/26/2015 1055   HGBUR NEGATIVE 12/26/2015 [place] 12/26/2015 1055   KETONESUR NEGATIVE 12/26/2015 1055   PROTEINUR NEGATIVE 12/26/2015 1055   NITRITE NEGATIVE 12/26/2015 1055   LEUKOCYTESUR NEGATIVE 12/26/2015 1055   Sepsis Labs: Invalid input(s): "PROCALCITONIN", "LACTICIDVEN"  Microbiology: Recent Results (from the past 240 hour(s))  Body fluid culture w Gram Stain     Status: None   Collection Time: 03/29/22  6:28 PM   Specimen: Synovium; Body Fluid  Result Value Ref Range Status   Specimen Description   Final    SYNOVIAL right knee Performed at [facility] [address], [place], [place] [address]    Special Requests   Final    NONE Performed at [facility], [place] [address] [place], [place] [address]    Gram Stain   Final    RARE WBC PRESENT, PREDOMINANTLY PMN NO ORGANISMS SEEN    Culture   Final    NO GROWTH Performed at [facility], [place] [address], [place], [place] [address]    Report Status 04/02/2022 FINAL  Final  Body fluid culture w Gram Stain     Status: None   Collection Time: 03/30/22  7:15 AM   Specimen: KNEE; Body Fluid  Result Value Ref Range Status   Specimen Description   Final    KNEE Performed at [facility] [address], [place], [place] [address]    Special Requests   Final    NONE Performed at [facility], [place] [address] [place],  [address]    Gram Stain   Final    RARE WBC PRESENT,BOTH PMN AND MONONUCLEAR NO ORGANISMS SEEN    Culture   Final    NO GROWTH Performed at [facility], [place] [address] [place],  [address]    Report Status 04/02/2022 FINAL  Final    Radiology  Studies: No results found.    Raynell Upton T. [place]  If 7PM-7AM, please contact night-coverage [email or web address] 04/04/2022, 10:14 AM

## 2022-04-05 ENCOUNTER — Other Ambulatory Visit: Payer: Self-pay

## 2022-04-05 DIAGNOSIS — D649 Anemia, unspecified: Secondary | ICD-10-CM | POA: Diagnosis not present

## 2022-04-05 DIAGNOSIS — M25561 Pain in right knee: Secondary | ICD-10-CM | POA: Diagnosis not present

## 2022-04-05 DIAGNOSIS — S72141A Displaced intertrochanteric fracture of right femur, initial encounter for closed fracture: Secondary | ICD-10-CM | POA: Diagnosis not present

## 2022-04-05 DIAGNOSIS — D631 Anemia in chronic kidney disease: Secondary | ICD-10-CM | POA: Diagnosis present

## 2022-04-05 DIAGNOSIS — M84551A Pathological fracture in neoplastic disease, right femur, initial encounter for fracture: Secondary | ICD-10-CM | POA: Diagnosis present

## 2022-04-05 DIAGNOSIS — S7221XA Displaced subtrochanteric fracture of right femur, initial encounter for closed fracture: Secondary | ICD-10-CM | POA: Diagnosis not present

## 2022-04-05 DIAGNOSIS — Z7401 Bed confinement status: Secondary | ICD-10-CM | POA: Diagnosis not present

## 2022-04-05 DIAGNOSIS — M11261 Other chondrocalcinosis, right knee: Secondary | ICD-10-CM | POA: Diagnosis not present

## 2022-04-05 DIAGNOSIS — M25461 Effusion, right knee: Secondary | ICD-10-CM | POA: Diagnosis present

## 2022-04-05 DIAGNOSIS — S7221XP Displaced subtrochanteric fracture of right femur, subsequent encounter for closed fracture with malunion: Secondary | ICD-10-CM | POA: Diagnosis not present

## 2022-04-05 DIAGNOSIS — R5381 Other malaise: Secondary | ICD-10-CM | POA: Diagnosis present

## 2022-04-05 DIAGNOSIS — L8991 Pressure ulcer of unspecified site, stage 1: Secondary | ICD-10-CM | POA: Diagnosis not present

## 2022-04-05 DIAGNOSIS — Z79899 Other long term (current) drug therapy: Secondary | ICD-10-CM | POA: Diagnosis not present

## 2022-04-05 DIAGNOSIS — S7224XP Nondisplaced subtrochanteric fracture of right femur, subsequent encounter for closed fracture with malunion: Secondary | ICD-10-CM | POA: Diagnosis not present

## 2022-04-05 DIAGNOSIS — M25551 Pain in right hip: Secondary | ICD-10-CM | POA: Diagnosis not present

## 2022-04-05 DIAGNOSIS — M23231 Derangement of other medial meniscus due to old tear or injury, right knee: Secondary | ICD-10-CM | POA: Diagnosis not present

## 2022-04-05 DIAGNOSIS — I509 Heart failure, unspecified: Secondary | ICD-10-CM | POA: Diagnosis not present

## 2022-04-05 DIAGNOSIS — M79604 Pain in right leg: Secondary | ICD-10-CM | POA: Diagnosis not present

## 2022-04-05 DIAGNOSIS — M7989 Other specified soft tissue disorders: Secondary | ICD-10-CM | POA: Diagnosis not present

## 2022-04-05 DIAGNOSIS — C9000 Multiple myeloma not having achieved remission: Secondary | ICD-10-CM | POA: Diagnosis present

## 2022-04-05 DIAGNOSIS — R638 Other symptoms and signs concerning food and fluid intake: Secondary | ICD-10-CM | POA: Diagnosis not present

## 2022-04-05 DIAGNOSIS — M6281 Muscle weakness (generalized): Secondary | ICD-10-CM | POA: Diagnosis not present

## 2022-04-05 DIAGNOSIS — R4701 Aphasia: Secondary | ICD-10-CM | POA: Diagnosis not present

## 2022-04-05 DIAGNOSIS — I10 Essential (primary) hypertension: Secondary | ICD-10-CM | POA: Diagnosis not present

## 2022-04-05 DIAGNOSIS — C419 Malignant neoplasm of bone and articular cartilage, unspecified: Secondary | ICD-10-CM | POA: Diagnosis not present

## 2022-04-05 DIAGNOSIS — L89311 Pressure ulcer of right buttock, stage 1: Secondary | ICD-10-CM | POA: Diagnosis present

## 2022-04-05 DIAGNOSIS — E669 Obesity, unspecified: Secondary | ICD-10-CM | POA: Diagnosis present

## 2022-04-05 DIAGNOSIS — I129 Hypertensive chronic kidney disease with stage 1 through stage 4 chronic kidney disease, or unspecified chronic kidney disease: Secondary | ICD-10-CM | POA: Diagnosis present

## 2022-04-05 DIAGNOSIS — R7303 Prediabetes: Secondary | ICD-10-CM | POA: Diagnosis present

## 2022-04-05 DIAGNOSIS — M25572 Pain in left ankle and joints of left foot: Secondary | ICD-10-CM | POA: Diagnosis not present

## 2022-04-05 DIAGNOSIS — R262 Difficulty in walking, not elsewhere classified: Secondary | ICD-10-CM | POA: Diagnosis not present

## 2022-04-05 DIAGNOSIS — E871 Hypo-osmolality and hyponatremia: Secondary | ICD-10-CM | POA: Diagnosis not present

## 2022-04-05 DIAGNOSIS — D696 Thrombocytopenia, unspecified: Secondary | ICD-10-CM | POA: Diagnosis present

## 2022-04-05 DIAGNOSIS — N1831 Chronic kidney disease, stage 3a: Secondary | ICD-10-CM | POA: Diagnosis present

## 2022-04-05 DIAGNOSIS — M84551P Pathological fracture in neoplastic disease, right femur, subsequent encounter for fracture with malunion: Secondary | ICD-10-CM | POA: Diagnosis not present

## 2022-04-05 DIAGNOSIS — E785 Hyperlipidemia, unspecified: Secondary | ICD-10-CM | POA: Diagnosis present

## 2022-04-05 DIAGNOSIS — R531 Weakness: Secondary | ICD-10-CM | POA: Diagnosis not present

## 2022-04-05 DIAGNOSIS — Z6830 Body mass index (BMI) 30.0-30.9, adult: Secondary | ICD-10-CM | POA: Diagnosis not present

## 2022-04-05 DIAGNOSIS — E559 Vitamin D deficiency, unspecified: Secondary | ICD-10-CM | POA: Diagnosis present

## 2022-04-05 DIAGNOSIS — N179 Acute kidney failure, unspecified: Secondary | ICD-10-CM | POA: Diagnosis present

## 2022-04-05 DIAGNOSIS — M810 Age-related osteoporosis without current pathological fracture: Secondary | ICD-10-CM | POA: Diagnosis present

## 2022-04-05 DIAGNOSIS — S72001A Fracture of unspecified part of neck of right femur, initial encounter for closed fracture: Secondary | ICD-10-CM | POA: Diagnosis not present

## 2022-04-05 DIAGNOSIS — S7224XA Nondisplaced subtrochanteric fracture of right femur, initial encounter for closed fracture: Secondary | ICD-10-CM | POA: Diagnosis not present

## 2022-04-05 DIAGNOSIS — E8581 Light chain (AL) amyloidosis: Secondary | ICD-10-CM | POA: Diagnosis present

## 2022-04-05 DIAGNOSIS — M1711 Unilateral primary osteoarthritis, right knee: Secondary | ICD-10-CM | POA: Diagnosis not present

## 2022-04-05 DIAGNOSIS — D509 Iron deficiency anemia, unspecified: Secondary | ICD-10-CM | POA: Diagnosis not present

## 2022-04-05 LAB — HEMOGLOBIN AND HEMATOCRIT, BLOOD
HCT: 26.3 % — ABNORMAL LOW (ref 39.0–52.0)
Hemoglobin: 8.6 g/dL — ABNORMAL LOW (ref 13.0–17.0)

## 2022-04-05 LAB — RENAL FUNCTION PANEL
Albumin: 3 g/dL — ABNORMAL LOW (ref 3.5–5.0)
Anion gap: 8 (ref 5–15)
BUN: 26 mg/dL — ABNORMAL HIGH (ref 8–23)
CO2: 24 mmol/L (ref 22–32)
Calcium: 9.1 mg/dL (ref 8.9–10.3)
Chloride: 102 mmol/L (ref 98–111)
Creatinine, Ser: 1.39 mg/dL — ABNORMAL HIGH (ref 0.61–1.24)
GFR, Estimated: 51 mL/min — ABNORMAL LOW (ref >60)
Glucose, Bld: 113 mg/dL — ABNORMAL HIGH (ref 70–99)
Phosphorus: 3 mg/dL (ref 2.5–4.6)
Potassium: 4.6 mmol/L (ref 3.5–5.1)
Sodium: 134 mmol/L — ABNORMAL LOW (ref 135–145)

## 2022-04-05 LAB — MAGNESIUM: Magnesium: 2.1 mg/dL (ref 1.7–2.4)

## 2022-04-05 NOTE — Progress Notes (Signed)
Discharge report called to receiving nurse at [facility], [place], [place].  AVS placed in the discharge packet by Festus Holts, RN with case management.

## 2022-04-05 NOTE — Plan of Care (Signed)

## 2022-04-05 NOTE — TOC Transition Note (Signed)
Transition of Care [facility]) - CM/SW Discharge Note   Patient Details  Name: Frank Hernandez MRN: [id number] Date of Birth: November 04, 1940  Transition of Care [facility]) CM/SW Contact:  Angelita Ingles, RN Phone Number:[phone]  04/05/2022, 12:27 PM   Clinical Narrative:    Patient discharging to [facility]. Wife at bedside made aware. Transportation arranged per [employer]. Discharge packet is on chart at nurses station. No other TOC needs at this time. TOC will sign off.   Please call report to  [facility] Room # 118   Final next level of care: Skilled Nursing Facility Barriers to Discharge: No Barriers Identified   Patient Goals and CMS Choice CMS Medicare.gov Compare Post Acute Care list provided to:: Patient Represenative (must comment) (spouse) Choice offered to / list presented to : Patient  Discharge Placement                Patient chooses bed at: [facility] Patient to be transferred to facility by: [place] Name of family member notified: Weyman Pedro Patient and family notified of of transfer: 04/05/22  Discharge Plan and Services Additional resources added to the After Visit Summary for   In-house Referral: NA Discharge Planning Services: CM Consult Post Acute Care Choice: [place]          DME Arranged: N/A DME Agency: NA         HH Agency: NA        Social Determinants of Health ([place]) Interventions SDOH Screenings   Food Insecurity: No Food Insecurity (04/05/2022)  Housing: Low Risk  (04/05/2022)  Transportation Needs: No Transportation Needs (04/05/2022)  Utilities: Not At Risk (04/05/2022)  Tobacco Use: Low Risk  (03/29/2022)     Readmission Risk Interventions    04/02/2022   11:43 AM  Readmission Risk Prevention Plan  Post Dischage Appt Complete  Medication Screening Complete  Transportation Screening Complete

## 2022-04-05 NOTE — TOC Progression Note (Signed)
Transition of Care [facility]) - Progression Note    Patient Details  Name: Frank Hernandez MRN: [id number] Date of Birth: 1940/06/28  Transition of Care [facility]) CM/SW [place], RN Phone [phone]  04/05/2022, 10:27 AM  Clinical Narrative:    Insurance auth approved  Plan auth ID [id number]  [place] ID [id number] 2/2-2/6. CM notifid Kia at [employer]. Patient is ok to discharge to [facility] today, confirmed with [facility] admissions director.    Expected Discharge Plan: Skilled Nursing Facility Barriers to Discharge: SNF Pending bed offer  Expected Discharge Plan and Services In-house Referral: NA Discharge Planning Services: CM Consult Post Acute Care Choice: [place]   Expected Discharge Date: 04/03/22               DME Arranged: N/A DME Agency: NA         HH Agency: NA         Social Determinants of Health (SDOH) Interventions SDOH Screenings   Food Insecurity: No Food Insecurity (04/05/2022)  Housing: Low Risk  (04/05/2022)  Transportation Needs: No Transportation Needs (04/05/2022)  Utilities: Not At Risk (04/05/2022)  Tobacco Use: Low Risk  (03/29/2022)    Readmission Risk Interventions    04/02/2022   11:43 AM  Readmission Risk Prevention Plan  Post Dischage Appt Complete  Medication Screening Complete  Transportation Screening Complete

## 2022-04-05 NOTE — Discharge Summary (Signed)
Physician Discharge Summary  Frank Hernandez BPZ:[id number] DOB: [birth year] DOA: 03/29/2022  PCP: [name] (Inactive)  Admit date: 03/29/2022 Discharge date: 04/05/2022 Admitted From: Home Disposition: SNF Recommendations for Outpatient Follow-up:  Follow up with orthopedic surgery as below Check CMP and CBC at follow-up Please follow up on the following pending results: None   Discharge Condition: Stable CODE STATUS: Full code  Follow-up Information     Callie Fielding, MD. Schedule an appointment as soon as possible for a visit in 2 week(s).   Specialty: Orthopedic Surgery Contact information: [address] [place] [address] [phone]                 Hospital course 82 year old M with PMH of osteoarthritis and HTN presenting with 1 week history of progressive right knee swelling and inability to ambulate.  No recent trauma or fall.  In ED, hemodynamically stable.  Afebrile.  X-ray showed progressive soft tissue edema about the distal femur and knee, small joint effusion, advanced medial compartment osteoarthritis and chondrocalcinosis.  Orthopedic surgery consulted.  Underwent aspiration for right knee effusion with steroid injection.  Fluid analysis with 3720 WBC and no crystals.  Synovial fluid culture negative.    MRI of right knee obtained and showed small complex nonspecific knee joint effusion, heterogeneous T2 hyperintensity throughout the distal vastus lateralis muscle with surrounding subcutaneous edema, laxity of patellar tendon and tricompartmental degenerative change and post meniscectomy changes or chronic degenerative tearing of the medial meniscus.    Patient has significant improvement in swelling and pain but very limited range of motion in the right knee.  Therapy recommended SNF.  Medically stable for discharge.  Outpatient follow-up with orthopedic surgery as above.  See individual problem list below for more.   Problems addressed  during this hospitalization Principal Problem:   Effusion of right knee Active Problems:   Essential hypertension   Pressure injury of skin   Effusion, right knee   Decreased oral intake   Osteoarthritis of right knee   Chronic kidney disease, stage 3a (HCC)   Right knee effusion secondary to osteoarthritis: S/p aspiration on 1/29 and 1/30.  No crystals in synovial fluid.  Synovial fluid cultures NGTD.  Low suspicion for infection.  X-ray and MRI as above.  Swelling and pain has improved but limited right of motion. -Appreciate input by Ortho-WBAT, -Tylenol and tramadol for pain. -Elevation, ice and range of motion exercise -Continue PT/OT-recommended SNF. -Outpatient follow-up with orthopedic surgery.   CKD-3A: AKI ruled out: Cr 1.16 in 05/2020 but no interval value.  No improvement with IV fluid and holding Diovan/HCT Recent Labs    03/29/22 1233 03/30/22 0331 03/31/22 0647 04/02/22 0514 04/03/22 0742 04/04/22 0642 04/05/22 0541  BUN 33* 30* 35* 28* 25* 22 26*  CREATININE 1.66* 1.42* 1.52* 1.48* 1.51* 1.37* 1.39*  -Discontinue Diovan/HCT -Avoid nephrotoxic meds -Encourage hydration. -Recheck renal function in 1 week    Essential hypertension: Normotensive off home Diovan/HCT -Continue home Cardura and amlodipine.   Normocytic anemia: Slight drop in Hgb likely dilutional from IV fluid.  Anemia panel with mild iron deficiency Recent Labs    03/29/22 1233 03/30/22 0331 03/31/22 0647 04/02/22 0514 04/03/22 0742 04/04/22 0642 04/05/22 0541  HGB 9.8* 9.1* 9.1* 8.5* 8.4* 8.7* 8.6*  -P.o. iron twice daily with bowel regimen.   Mild hyponatremia: Stable.   Hypomagnesemia: Resolved   Hyperlipidemia -Continue Crestor.    Decreased oral intake/obesity: Improved. Body mass index is 32.9 kg/m. Nutrition Problem: Increased nutrient  needs Etiology: acute illness Signs/Symptoms: estimated needs Interventions: Ensure Enlive (each supplement provides 350kcal and 20  grams of protein), MVI, Refer to RD note for recommendations   Pressure skin injury: POA Pressure Injury 03/29/22 Buttocks Right Stage 1 -  Intact skin with non-blanchable redness of a localized area usually over a bony prominence. (Active)  03/29/22 2140  Location: Buttocks  Location Orientation: Right  Staging: Stage 1 -  Intact skin with non-blanchable redness of a localized area usually over a bony prominence.  Wound Description (Comments):   Present on Admission: Yes  Dressing Type Foam - Lift dressing to assess site every shift 04/05/22 0945    Vital signs Vitals:   04/04/22 0418 04/04/22 1448 04/04/22 1945 04/05/22 0424  BP: (!) 140/68 122/70 113/60 137/66  Pulse: 70 82 88 65  Temp: 98.9 F (37.2 C) 98.1 F (36.7 C) 99.2 F (37.3 C) 98.3 F (36.8 C)  Resp: '18 18 18 18  '$ Height:      Weight:      SpO2: 99% 97% 99% 99%  TempSrc: Oral Oral Oral Oral  BMI (Calculated):         Discharge exam  GENERAL: No apparent distress.  Nontoxic. HEENT: MMM.  Vision and hearing grossly intact.  NECK: Supple.  No apparent JVD.  RESP:  No IWOB.  Fair aeration bilaterally. CVS:  RRR. Heart sounds normal.  ABD/GI/GU: BS+. Abd soft, NTND.  MSK/EXT:  Moves extremities.  Some residual swelling in right knee.  No tenderness.  Limited range of motion in right knee. NEURO: Awake and alert. Oriented appropriately.  No apparent focal neuro deficit. PSYCH: Calm. Normal affect.   Discharge Instructions Discharge Instructions     Diet - low sodium heart healthy   Complete by: As directed    Increase activity slowly   Complete by: As directed    No wound care   Complete by: As directed       Allergies as of 04/05/2022   No Known Allergies      Medication List     STOP taking these medications    naproxen sodium 220 MG tablet Commonly known as: ALEVE   valsartan-hydrochlorothiazide 160-12.5 MG tablet Commonly known as: DIOVAN-HCT       TAKE these medications     acetaminophen 325 MG tablet Commonly known as: TYLENOL Take 2 tablets (650 mg total) by mouth every 6 (six) hours as needed for mild pain or headache (or Fever >/= 101). What changed:  medication strength how much to take reasons to take this   amLODipine 10 MG tablet Commonly known as: NORVASC Take 10 mg by mouth daily.   DOXAZOSIN MESYLATE PO Take 2 mg by mouth every evening.   feeding supplement Liqd Take 237 mLs by mouth 2 (two) times daily between meals.   ferrous sulfate 325 (65 FE) MG EC tablet Take 1 tablet (325 mg total) by mouth 2 (two) times daily.   multivitamins with iron Tabs tablet Take 1 tablet by mouth daily.   rosuvastatin 20 MG tablet Commonly known as: CRESTOR Take 20 mg by mouth daily.   senna-docusate 8.6-50 MG tablet Commonly known as: Senokot-S Take 1 tablet by mouth at bedtime as needed for mild constipation.   traMADol 50 MG tablet Commonly known as: Ultram Take 1 tablet (50 mg total) by mouth every 8 (eight) hours as needed for moderate pain. What changed:  when to take this reasons to take this  Consultations: Orthopedic surgery  Procedures/Studies: 1/29-arthrocentesis of right knee 1/30-arthrocentesis of right knee   MR KNEE RIGHT WO CONTRAST  Result Date: 04/01/2022 CLINICAL DATA:  Continued right knee pain and swelling for 7 days with limited weight-bearing. Soft tissue infection suspected. EXAM: MRI OF THE RIGHT KNEE WITHOUT CONTRAST TECHNIQUE: Multiplanar, multisequence MR imaging of the knee was performed. No intravenous contrast was administered. COMPARISON:  Radiographs 03/29/2022 and 03/23/2022. FINDINGS: Despite efforts by the technologist and patient, mild motion artifact is present on today's exam and could not be eliminated. This reduces exam sensitivity and specificity. MENISCI Medial meniscus: The medial meniscus is diffusely diminutive with free edge blunting and peripheral extrusion from the joint space.  Medical record indicates previous knee arthroscopy, and these findings could be postsurgical, but are suspicious for diffuse degenerative tearing. No centrally displaced meniscal fragments identified. Lateral meniscus:  Intact with normal morphology. LIGAMENTS Cruciates:  Intact. Collaterals: Intact. There is medial buckling of the MCL related to the meniscal extrusion and marginal osteophytes. CARTILAGE Patellofemoral: Moderate patellofemoral degenerative changes with chondral thinning, surface irregularity and osteophytes. 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 No subchondral edema or erosive changes. Lateral: Mild chondral thinning and peripheral osteophyte formation. No subchondral edema or erosive changes. MISCELLANEOUS Joint:  Small nonspecific mildly complex knee joint effusion. Popliteal Fossa: The popliteus muscle and tendon are intact. Small Baker's cyst. Extensor Mechanism: Patella baja with laxity of the patellar tendon similar to recent radiographs. The visualized quadriceps tendon appears intact. There is heterogeneous T2 hyperintensity throughout the distal vastus lateralis muscle which appears mildly atrophied on the T1 weighted images. Bones: No evidence of acute fracture or dislocation. No evidence of osteomyelitis. Other: As above, T2 hyperintensity in the distal vastus lateralis muscle with surrounding subcutaneous edema. There is additional subcutaneous edema posteromedially. No focal fluid collections are identified. IMPRESSION: 1. No acute osseous findings or specific evidence of osteomyelitis. Small complex nonspecific knee joint effusion. 2. Heterogeneous T2 hyperintensity throughout the distal vastus lateralis muscle with surrounding subcutaneous edema. These findings are nonspecific and could reflect myositis, subacute denervation or a partial tear. No focal fluid collections identified. 3. Patella baja with laxity of the  patellar tendon. The visualized quadriceps tendon appears intact. 4. Tricompartmental degenerative changes, most advanced in the medial compartment. 5. Post meniscectomy changes or chronic degenerative tearing of the medial meniscus. 6. The lateral meniscus, cruciate and collateral ligaments are intact. Electronically Signed   By: Richardean Sale M.D.   On: 04/01/2022 12:08   DG Knee Complete 4 Views Right  Result Date: 03/29/2022 CLINICAL DATA:  Chronic knee pain. EXAM: RIGHT KNEE - COMPLETE 4+ VIEW COMPARISON:  03/23/2018 for FINDINGS: Small suprapatellar joint effusion. Progressive soft tissue edema about the distal portion of the femur and knee. No acute fracture marked medial compartment joint space narrowing with marginal spur formation. Mild degenerative changes noted within the patellofemoral and lateral compartment. Sharpening of the tibial spines. Mild chondrocalcinosis. IMPRESSION: 1. Progressive soft tissue edema about the distal femur and knee. 2. Small joint effusion. 3. 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 Electronically Signed   By: Kerby Moors M.D.   On: 03/29/2022 12:59   VAS [place] LOWER EXTREMITY VENOUS (DVT)  Result Date: 03/23/2022  Lower Venous DVT Study Patient Name:  GUS LITTLER  Date of Exam:   03/23/2022 Medical Rec #: [id number]            Accession #:    [id number] Date of Birth: [birth year]  Patient Gender: M Patient Age:   85 years Exam Location:  [facility] Procedure:      VAS [place] LOWER EXTREMITY VENOUS (DVT) Referring Phys: Marda Stalker --------------------------------------------------------------------------------  Indications: Knee pain.  Limitations: Poor ultrasound/tissue interface. Performing Technologist: Rogelia Rohrer RVT, RDMS  Examination Guidelines: A complete evaluation includes B-mode imaging, spectral Doppler, color Doppler, and power Doppler as needed of all accessible portions of each vessel. Bilateral  testing is considered an integral part of a complete examination. Limited examinations for reoccurring indications may be performed as noted. The reflux portion of the exam is performed with the patient in reverse Trendelenburg.  +---------+---------------+---------+-----------+----------+--------------+ RIGHT    CompressibilityPhasicitySpontaneityPropertiesThrombus Aging +---------+---------------+---------+-----------+----------+--------------+ CFV      Full           Yes      Yes                                 +---------+---------------+---------+-----------+----------+--------------+ SFJ      Full                                                        +---------+---------------+---------+-----------+----------+--------------+ FV Prox  Full           Yes      Yes                                 +---------+---------------+---------+-----------+----------+--------------+ FV Mid   Full           Yes      Yes                                 +---------+---------------+---------+-----------+----------+--------------+ FV DistalFull           Yes      Yes                                 +---------+---------------+---------+-----------+----------+--------------+ PFV      Full                                                        +---------+---------------+---------+-----------+----------+--------------+ POP      Full           Yes      Yes                                 +---------+---------------+---------+-----------+----------+--------------+ PTV      Full                                                        +---------+---------------+---------+-----------+----------+--------------+ PERO     Full                                                        +---------+---------------+---------+-----------+----------+--------------+   +----+---------------+---------+-----------+----------+--------------+  LEFTCompressibilityPhasicitySpontaneityPropertiesThrombus Aging +----+---------------+---------+-----------+----------+--------------+ CFV Full           Yes      Yes                                 +----+---------------+---------+-----------+----------+--------------+     Summary: RIGHT: - No evidence of common femoral vein obstruction. - No cystic structure found in the popliteal fossa. Subcutaneous edema seen in area of popliteal fossa and calf.  LEFT: - No evidence of common femoral vein obstruction.  *See table(s) above for measurements and observations. Electronically signed by Deitra Mayo MD on 03/23/2022 at 4:22:29 PM.    Final    DG Knee Complete 4 Views Right  Result Date: 03/23/2022 CLINICAL DATA:  Lateral left knee pain starting last night. Radiates up left thigh. EXAM: RIGHT KNEE - COMPLETE 4+ VIEW COMPARISON:  None Available. FINDINGS: There is diffuse decreased bone mineralization. Tiny joint effusion. Moderate patellofemoral joint space narrowing and peripheral osteophytosis. Minimal chronic enthesopathic change at the quadriceps insertion on the patella. Moderate to severe medial and mild lateral compartment joint space narrowing and peripheral osteophytosis. No acute fracture is seen. 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 Electronically Signed   By: Yvonne Kendall M.D.   On: 03/23/2022 08:42       The results of significant diagnostics from this hospitalization (including imaging, microbiology, ancillary and laboratory) are listed below for reference.     Microbiology: Recent Results (from the past 240 hour(s))  Body fluid culture w Gram Stain     Status: None   Collection Time: 03/29/22  6:28 PM   Specimen: Synovium; Body Fluid  Result Value Ref Range Status   Specimen Description   Final    SYNOVIAL right knee Performed at [facility] [address] [place], [place] [address]     Special Requests   Final    NONE Performed at [facility], [place] [address] [place], [place] [address]    Gram Stain   Final    RARE WBC PRESENT, PREDOMINANTLY PMN NO ORGANISMS SEEN    Culture   Final    NO GROWTH Performed at [facility], [place] [address], [place], [place] [address]    Report Status 04/02/2022 FINAL  Final  Body fluid culture w Gram Stain     Status: None   Collection Time: 03/30/22  7:15 AM   Specimen: KNEE; Body Fluid  Result Value Ref Range Status   Specimen Description   Final    KNEE Performed at [facility] [address], [place], [place] [address]    Special Requests   Final    NONE Performed at [facility], [place] [address] [place], [place] [address]    Gram Stain   Final    RARE WBC PRESENT,BOTH PMN AND MONONUCLEAR NO ORGANISMS SEEN    Culture   Final    NO GROWTH Performed at [facility], [place] [address], [place],  [address]    Report Status 04/02/2022 FINAL  Final     Labs:  CBC: Recent Labs  Lab 03/30/22 0331 03/31/22 0647 04/02/22 0514 04/03/22 0742 04/04/22 0642 04/05/22 0541  WBC 5.1 6.0 5.7 6.1 6.7  --   NEUTROABS 3.3  --   --   --   --   --   HGB 9.1* 9.1* 8.5* 8.4* 8.7*  8.6*  HCT 28.1* 28.3* 25.0* 24.6* 26.9* 26.3*  MCV 94.6 95.6 92.6 93.2 95.4  --   PLT 187 235 242 233 262  --    BMP &GFR Recent Labs  Lab 03/31/22 0647 04/02/22 0514 04/03/22 0742 04/04/22 0642 04/05/22 0541  NA 133* 135 134* 135 134*  K 4.8 4.7 4.3 4.5 4.6  CL 104 105 104 105 102  CO2 20* '23 24 24 24  '$ GLUCOSE 106* 89 102* 92 113*  BUN 35* 28* 25* 22 26*  CREATININE 1.52* 1.48* 1.51* 1.37* 1.39*  CALCIUM 9.3 9.4 9.0 9.1 9.1  MG 1.8 1.9 1.5* 2.0 2.1  PHOS  --  2.7 2.5 2.9 3.0   Estimated Creatinine Clearance: 50.4 mL/min (A) (by C-G formula based on SCr of 1.39 mg/dL (H)). Liver & Pancreas: Recent Labs  Lab 04/02/22 0514 04/03/22 0742 04/04/22 0642  04/05/22 0541  ALBUMIN 2.7* 2.6* 2.7* 3.0*   No results for input(s): "LIPASE", "AMYLASE" in the last 168 hours. No results for input(s): "AMMONIA" in the last 168 hours. Diabetic: No results for input(s): "HGBA1C" in the last 72 hours. No results for input(s): "GLUCAP" in the last 168 hours. Cardiac Enzymes: No results for input(s): "CKTOTAL", "CKMB", "CKMBINDEX", "TROPONINI" in the last 168 hours. No results for input(s): "PROBNP" in the last 8760 hours. Coagulation Profile: No results for input(s): "INR", "PROTIME" in the last 168 hours. Thyroid Function Tests: No results for input(s): "TSH", "T4TOTAL", "FREET4", "T3FREE", "THYROIDAB" in the last 72 hours. Lipid Profile: No results for input(s): "CHOL", "HDL", "LDLCALC", "TRIG", "CHOLHDL", "LDLDIRECT" in the last 72 hours. Anemia Panel: No results for input(s): "VITAMINB12", "FOLATE", "FERRITIN", "TIBC", "IRON", "RETICCTPCT" in the last 72 hours. Urine analysis:    Component Value Date/Time   COLORURINE YELLOW 12/26/2015 [place] 12/26/2015 1055   LABSPEC 1.012 12/26/2015 1055   PHURINE 5.5 12/26/2015 1055   GLUCOSEU NEGATIVE 12/26/2015 1055   HGBUR NEGATIVE 12/26/2015 [place] 12/26/2015 1055   KETONESUR NEGATIVE 12/26/2015 1055   PROTEINUR NEGATIVE 12/26/2015 1055   NITRITE NEGATIVE 12/26/2015 1055   LEUKOCYTESUR NEGATIVE 12/26/2015 1055   Sepsis Labs: Invalid input(s): "PROCALCITONIN", "LACTICIDVEN"   SIGNED:  Mercy Riding, MD  [facility] 04/05/2022, 10:35 AM

## 2022-04-05 NOTE — Progress Notes (Signed)
Physical Therapy Treatment Patient Details Name: Frank Hernandez MRN: [id number] DOB: [birth year] Today's Date: 04/05/2022   History of Present Illness 82 year old male with past medical history of arthritis, hypertension.  Approximately 7 days ago he developed swelling in the right knee.  This has been progressive.  At baseline he uses a walker, he has been unable to walk for the past 7 days due to swelling and pain in the right knee. Knee aspirate consistent with crystalline or septic arthritis.    PT Comments    Pt agreeable to intervention, significant other present and PT under impression pt will d/c to SNF.  Pt continues to require A for bed mobility tasks, use of stedy lift for STS from elevated EOB and B UE support and cues standing on lift for extension posture and encouragement for ~ 3.5 mins. Pt able to weight shift to R LE when standing in lift with cues and no apparent increased pain response. D/c plan remains appropriate will continue to follow pt acutely.    Recommendations for follow up therapy are one component of a multi-disciplinary discharge planning process, led by the attending physician.  Recommendations may be updated based on patient status, additional functional criteria and insurance authorization.  Follow Up Recommendations  Skilled nursing-short term rehab (<3 hours/day) Can patient physically be transported by private vehicle: No   Assistance Recommended at Discharge Frequent or constant Supervision/Assistance  Patient can return home with the following Two people to help with walking and/or transfers;Two people to help with bathing/dressing/bathroom;Assistance with cooking/housework;Assist for transportation;Help with stairs or ramp for entrance   Equipment Recommendations  None recommended by PT    Recommendations for Other Services       Precautions / Restrictions Precautions Precautions: Fall Precaution Comments: denies falls in past 6  months Restrictions Weight Bearing Restrictions: No     Mobility  Bed Mobility Overal bed mobility: Needs Assistance Bed Mobility: Supine to Sit, Sit to Supine     Supine to sit: HOB elevated, Max assist Sit to supine: Mod assist (flat surface)   General bed mobility comments: cues increaed time for supine<> sit; use of R bed rail for supin to sit    Transfers Overall transfer level: Needs assistance Equipment used: Ambulation equipment used Transfers: Sit to/from Stand, Bed to chair/wheelchair/BSC Sit to Stand: Max assist           General transfer comment: pt stood from elevated bed pulling up on bar of Stedy, pt able to stand for ~31/2 minuted with cues for extension posture. when standing on stedy PT facilitated weight shifting with improved R LE weight acceptance. Transfer via Lift Equipment: Stedy  Ambulation/Gait                   Stairs             Wheelchair Mobility    Modified Rankin (Stroke Patients Only)       Balance Overall balance assessment: Needs assistance Sitting-balance support: No upper extremity supported       Standing balance support: Bilateral upper extremity supported (on stedy lift) Standing balance-Leahy Scale: Poor                              Cognition Arousal/Alertness: Awake/alert Behavior During Therapy: WFL for tasks assessed/performed Overall Cognitive Status: Within Functional Limits for tasks assessed  Exercises      General Comments        Pertinent Vitals/Pain Pain Assessment Pain Assessment: Faces Pain Score: 6  Faces Pain Scale: Hurts even more Pain Location: R knee Pain Descriptors / Indicators: Discomfort, Grimacing, Moaning Pain Intervention(s): Premedicated before session    Home Living                          Prior Function            PT Goals (current goals can now be found in the care plan  section) Acute Rehab PT Goals Patient Stated Goal: return to walking PT Goal Formulation: With patient/family Time For Goal Achievement: 04/13/22 Potential to Achieve Goals: Fair    Frequency    Min 2X/week      PT Plan Current plan remains appropriate    Co-evaluation              AM-PAC PT "6 Clicks" Mobility   Outcome Measure  Help needed turning from your back to your side while in a flat bed without using bedrails?: A Lot Help needed moving from lying on your back to sitting on the side of a flat bed without using bedrails?: A Lot Help needed moving to and from a bed to a chair (including a wheelchair)?: A Lot Help needed standing up from a chair using your arms (e.g., wheelchair or bedside chair)?: Total Help needed to walk in hospital room?: Total Help needed climbing 3-5 steps with a railing? : Total 6 Click Score: 9    End of Session Equipment Utilized During Treatment: Gait belt Activity Tolerance: Patient limited by pain Patient left: with call bell/phone within reach;with family/visitor present;in chair;with bed alarm set Nurse Communication: Mobility status;Need for lift equipment PT Visit Diagnosis: Difficulty in walking, not elsewhere classified (R26.2);Pain Pain - Right/Left: Right Pain - part of body: Knee     Time: 1345-1416 PT Time Calculation (min) (ACUTE ONLY): 31 min  Charges:  $Therapeutic Activity: 23-37 mins                    Baird Lyons, PT   Adair Patter 04/05/2022, 3:42 PM

## 2022-04-06 DIAGNOSIS — I10 Essential (primary) hypertension: Secondary | ICD-10-CM | POA: Diagnosis not present

## 2022-04-06 DIAGNOSIS — M1711 Unilateral primary osteoarthritis, right knee: Secondary | ICD-10-CM | POA: Diagnosis not present

## 2022-04-06 DIAGNOSIS — M25561 Pain in right knee: Secondary | ICD-10-CM | POA: Diagnosis not present

## 2022-04-06 DIAGNOSIS — N1831 Chronic kidney disease, stage 3a: Secondary | ICD-10-CM | POA: Diagnosis not present

## 2022-04-06 DIAGNOSIS — M25461 Effusion, right knee: Secondary | ICD-10-CM | POA: Diagnosis not present

## 2022-04-08 DIAGNOSIS — D509 Iron deficiency anemia, unspecified: Secondary | ICD-10-CM | POA: Diagnosis not present

## 2022-04-08 DIAGNOSIS — R5381 Other malaise: Secondary | ICD-10-CM | POA: Diagnosis not present

## 2022-04-08 DIAGNOSIS — M1711 Unilateral primary osteoarthritis, right knee: Secondary | ICD-10-CM | POA: Diagnosis not present

## 2022-04-08 DIAGNOSIS — E785 Hyperlipidemia, unspecified: Secondary | ICD-10-CM | POA: Diagnosis not present

## 2022-04-08 DIAGNOSIS — M25561 Pain in right knee: Secondary | ICD-10-CM | POA: Diagnosis not present

## 2022-04-08 DIAGNOSIS — I10 Essential (primary) hypertension: Secondary | ICD-10-CM | POA: Diagnosis not present

## 2022-04-08 DIAGNOSIS — R262 Difficulty in walking, not elsewhere classified: Secondary | ICD-10-CM | POA: Diagnosis not present

## 2022-04-09 DIAGNOSIS — M1711 Unilateral primary osteoarthritis, right knee: Secondary | ICD-10-CM | POA: Diagnosis not present

## 2022-04-09 DIAGNOSIS — M25461 Effusion, right knee: Secondary | ICD-10-CM | POA: Diagnosis not present

## 2022-04-09 DIAGNOSIS — N1831 Chronic kidney disease, stage 3a: Secondary | ICD-10-CM | POA: Diagnosis not present

## 2022-04-09 DIAGNOSIS — M25561 Pain in right knee: Secondary | ICD-10-CM | POA: Diagnosis not present

## 2022-04-09 DIAGNOSIS — I10 Essential (primary) hypertension: Secondary | ICD-10-CM | POA: Diagnosis not present

## 2022-04-12 DIAGNOSIS — M25551 Pain in right hip: Secondary | ICD-10-CM | POA: Diagnosis not present

## 2022-04-13 ENCOUNTER — Inpatient Hospital Stay (HOSPITAL_COMMUNITY)
Admission: EM | Admit: 2022-04-13 | Discharge: 2022-04-22 | DRG: 478 | Disposition: A | Discharge status: Skilled Nursing Facility | Payer: Medicare PPO | Source: Skilled Nursing Facility | Attending: Internal Medicine | Admitting: Internal Medicine

## 2022-04-13 ENCOUNTER — Emergency Department (HOSPITAL_COMMUNITY): Payer: Medicare PPO

## 2022-04-13 ENCOUNTER — Inpatient Hospital Stay (HOSPITAL_COMMUNITY): Payer: Medicare PPO

## 2022-04-13 ENCOUNTER — Encounter (HOSPITAL_COMMUNITY): Payer: Self-pay | Admitting: Internal Medicine

## 2022-04-13 DIAGNOSIS — I509 Heart failure, unspecified: Secondary | ICD-10-CM | POA: Diagnosis not present

## 2022-04-13 DIAGNOSIS — E8581 Light chain (AL) amyloidosis: Secondary | ICD-10-CM | POA: Diagnosis not present

## 2022-04-13 DIAGNOSIS — C419 Malignant neoplasm of bone and articular cartilage, unspecified: Secondary | ICD-10-CM

## 2022-04-13 DIAGNOSIS — S7221XA Displaced subtrochanteric fracture of right femur, initial encounter for closed fracture: Secondary | ICD-10-CM | POA: Diagnosis not present

## 2022-04-13 DIAGNOSIS — I10 Essential (primary) hypertension: Secondary | ICD-10-CM | POA: Diagnosis not present

## 2022-04-13 DIAGNOSIS — D649 Anemia, unspecified: Secondary | ICD-10-CM | POA: Diagnosis not present

## 2022-04-13 DIAGNOSIS — R5381 Other malaise: Secondary | ICD-10-CM | POA: Diagnosis present

## 2022-04-13 DIAGNOSIS — R262 Difficulty in walking, not elsewhere classified: Secondary | ICD-10-CM | POA: Diagnosis not present

## 2022-04-13 DIAGNOSIS — E785 Hyperlipidemia, unspecified: Secondary | ICD-10-CM | POA: Diagnosis present

## 2022-04-13 DIAGNOSIS — Z7401 Bed confinement status: Secondary | ICD-10-CM | POA: Diagnosis not present

## 2022-04-13 DIAGNOSIS — S7224XP Nondisplaced subtrochanteric fracture of right femur, subsequent encounter for closed fracture with malunion: Secondary | ICD-10-CM | POA: Diagnosis not present

## 2022-04-13 DIAGNOSIS — M7989 Other specified soft tissue disorders: Secondary | ICD-10-CM | POA: Diagnosis not present

## 2022-04-13 DIAGNOSIS — D631 Anemia in chronic kidney disease: Secondary | ICD-10-CM | POA: Diagnosis not present

## 2022-04-13 DIAGNOSIS — M79604 Pain in right leg: Secondary | ICD-10-CM | POA: Diagnosis not present

## 2022-04-13 DIAGNOSIS — I129 Hypertensive chronic kidney disease with stage 1 through stage 4 chronic kidney disease, or unspecified chronic kidney disease: Secondary | ICD-10-CM | POA: Diagnosis present

## 2022-04-13 DIAGNOSIS — Z6830 Body mass index (BMI) 30.0-30.9, adult: Secondary | ICD-10-CM

## 2022-04-13 DIAGNOSIS — D696 Thrombocytopenia, unspecified: Secondary | ICD-10-CM | POA: Diagnosis present

## 2022-04-13 DIAGNOSIS — M84451A Pathological fracture, right femur, initial encounter for fracture: Secondary | ICD-10-CM | POA: Diagnosis not present

## 2022-04-13 DIAGNOSIS — M6281 Muscle weakness (generalized): Secondary | ICD-10-CM | POA: Diagnosis not present

## 2022-04-13 DIAGNOSIS — M84551P Pathological fracture in neoplastic disease, right femur, subsequent encounter for fracture with malunion: Secondary | ICD-10-CM | POA: Diagnosis not present

## 2022-04-13 DIAGNOSIS — R1310 Dysphagia, unspecified: Secondary | ICD-10-CM | POA: Diagnosis not present

## 2022-04-13 DIAGNOSIS — S7224XA Nondisplaced subtrochanteric fracture of right femur, initial encounter for closed fracture: Secondary | ICD-10-CM

## 2022-04-13 DIAGNOSIS — Z79899 Other long term (current) drug therapy: Secondary | ICD-10-CM | POA: Diagnosis not present

## 2022-04-13 DIAGNOSIS — C9000 Multiple myeloma not having achieved remission: Secondary | ICD-10-CM | POA: Diagnosis present

## 2022-04-13 DIAGNOSIS — N179 Acute kidney failure, unspecified: Secondary | ICD-10-CM | POA: Diagnosis not present

## 2022-04-13 DIAGNOSIS — E669 Obesity, unspecified: Secondary | ICD-10-CM | POA: Diagnosis present

## 2022-04-13 DIAGNOSIS — R4701 Aphasia: Secondary | ICD-10-CM | POA: Diagnosis not present

## 2022-04-13 DIAGNOSIS — E559 Vitamin D deficiency, unspecified: Secondary | ICD-10-CM | POA: Insufficient documentation

## 2022-04-13 DIAGNOSIS — L89311 Pressure ulcer of right buttock, stage 1: Secondary | ICD-10-CM | POA: Diagnosis not present

## 2022-04-13 DIAGNOSIS — S72001A Fracture of unspecified part of neck of right femur, initial encounter for closed fracture: Secondary | ICD-10-CM | POA: Diagnosis not present

## 2022-04-13 DIAGNOSIS — M25551 Pain in right hip: Secondary | ICD-10-CM | POA: Diagnosis not present

## 2022-04-13 DIAGNOSIS — Z4789 Encounter for other orthopedic aftercare: Secondary | ICD-10-CM | POA: Diagnosis not present

## 2022-04-13 DIAGNOSIS — M25572 Pain in left ankle and joints of left foot: Secondary | ICD-10-CM | POA: Diagnosis not present

## 2022-04-13 DIAGNOSIS — M25461 Effusion, right knee: Secondary | ICD-10-CM | POA: Diagnosis present

## 2022-04-13 DIAGNOSIS — S79919A Unspecified injury of unspecified hip, initial encounter: Secondary | ICD-10-CM | POA: Diagnosis not present

## 2022-04-13 DIAGNOSIS — M84551A Pathological fracture in neoplastic disease, right femur, initial encounter for fracture: Secondary | ICD-10-CM | POA: Diagnosis present

## 2022-04-13 DIAGNOSIS — Z7189 Other specified counseling: Secondary | ICD-10-CM

## 2022-04-13 DIAGNOSIS — M810 Age-related osteoporosis without current pathological fracture: Secondary | ICD-10-CM | POA: Diagnosis present

## 2022-04-13 DIAGNOSIS — S72141A Displaced intertrochanteric fracture of right femur, initial encounter for closed fracture: Secondary | ICD-10-CM | POA: Diagnosis not present

## 2022-04-13 DIAGNOSIS — N1831 Chronic kidney disease, stage 3a: Secondary | ICD-10-CM | POA: Diagnosis present

## 2022-04-13 DIAGNOSIS — M84551D Pathological fracture in neoplastic disease, right femur, subsequent encounter for fracture with routine healing: Secondary | ICD-10-CM | POA: Diagnosis not present

## 2022-04-13 DIAGNOSIS — R7303 Prediabetes: Secondary | ICD-10-CM | POA: Diagnosis present

## 2022-04-13 DIAGNOSIS — S7221XP Displaced subtrochanteric fracture of right femur, subsequent encounter for closed fracture with malunion: Secondary | ICD-10-CM | POA: Diagnosis not present

## 2022-04-13 DIAGNOSIS — C9030 Solitary plasmacytoma not having achieved remission: Secondary | ICD-10-CM | POA: Diagnosis not present

## 2022-04-13 DIAGNOSIS — C7951 Secondary malignant neoplasm of bone: Secondary | ICD-10-CM | POA: Diagnosis not present

## 2022-04-13 LAB — CBC WITH DIFFERENTIAL/PLATELET
Abs Immature Granulocytes: 0.13 10*3/uL — ABNORMAL HIGH (ref 0.00–0.07)
Basophils Absolute: 0 10*3/uL (ref 0.0–0.1)
Basophils Relative: 0 %
Eosinophils Absolute: 0 10*3/uL (ref 0.0–0.5)
Eosinophils Relative: 0 %
HCT: 27.9 % — ABNORMAL LOW (ref 39.0–52.0)
Hemoglobin: 9.1 g/dL — ABNORMAL LOW (ref 13.0–17.0)
Immature Granulocytes: 2 %
Lymphocytes Relative: 8 %
Lymphs Abs: 0.7 10*3/uL (ref 0.7–4.0)
MCH: 30.4 pg (ref 26.0–34.0)
MCHC: 32.6 g/dL (ref 30.0–36.0)
MCV: 93.3 fL (ref 80.0–100.0)
Monocytes Absolute: 0.6 10*3/uL (ref 0.1–1.0)
Monocytes Relative: 7 %
Neutro Abs: 6.9 10*3/uL (ref 1.7–7.7)
Neutrophils Relative %: 83 %
Platelets: 254 10*3/uL (ref 150–400)
RBC: 2.99 MIL/uL — ABNORMAL LOW (ref 4.22–5.81)
RDW: 13.3 % (ref 11.5–15.5)
WBC: 8.3 10*3/uL (ref 4.0–10.5)
nRBC: 0 % (ref 0.0–0.2)

## 2022-04-13 LAB — BASIC METABOLIC PANEL
Anion gap: 9 (ref 5–15)
BUN: 41 mg/dL — ABNORMAL HIGH (ref 8–23)
CO2: 22 mmol/L (ref 22–32)
Calcium: 9 mg/dL (ref 8.9–10.3)
Chloride: 104 mmol/L (ref 98–111)
Creatinine, Ser: 1.59 mg/dL — ABNORMAL HIGH (ref 0.61–1.24)
GFR, Estimated: 44 mL/min — ABNORMAL LOW (ref >60)
Glucose, Bld: 136 mg/dL — ABNORMAL HIGH (ref 70–99)
Potassium: 4.3 mmol/L (ref 3.5–5.1)
Sodium: 135 mmol/L (ref 135–145)

## 2022-04-13 LAB — SURGICAL PCR SCREEN
MRSA, PCR: NEGATIVE
Staphylococcus aureus: NEGATIVE

## 2022-04-13 LAB — PROTIME-INR
INR: 1.1 (ref 0.8–1.2)
Prothrombin Time: 14.2 seconds (ref 11.4–15.2)

## 2022-04-13 LAB — HEMOGLOBIN A1C
Hgb A1c MFr Bld: 6 % — ABNORMAL HIGH (ref 4.8–5.6)
Mean Plasma Glucose: 125.5 mg/dL

## 2022-04-13 MED ORDER — MORPHINE SULFATE (PF) 2 MG/ML IV SOLN
2.0000 mg | INTRAVENOUS | Status: DC | PRN
  Administered 2022-04-14 – 2022-04-21 (×4): 2 mg via INTRAVENOUS
  Filled 2022-04-13 (×4): qty 1

## 2022-04-13 MED ORDER — TAB-A-VITE/IRON PO TABS
1.0000 | ORAL_TABLET | Freq: Every day | ORAL | Status: DC
  Administered 2022-04-14 – 2022-04-22 (×7): 1 via ORAL
  Filled 2022-04-13 (×9): qty 1

## 2022-04-13 MED ORDER — TRAMADOL HCL 50 MG PO TABS
50.0000 mg | ORAL_TABLET | Freq: Four times a day (QID) | ORAL | Status: DC | PRN
  Administered 2022-04-13 – 2022-04-22 (×18): 50 mg via ORAL
  Filled 2022-04-13 (×19): qty 1

## 2022-04-13 MED ORDER — ALBUTEROL SULFATE (2.5 MG/3ML) 0.083% IN NEBU: 2.5000 mg | INHALATION_SOLUTION | RESPIRATORY_TRACT | Status: DC | PRN

## 2022-04-13 MED ORDER — SODIUM CHLORIDE 0.9% FLUSH
3.0000 mL | Freq: Two times a day (BID) | INTRAVENOUS | Status: DC
  Administered 2022-04-13 – 2022-04-21 (×12): 3 mL via INTRAVENOUS

## 2022-04-13 MED ORDER — ENSURE ENLIVE PO LIQD
237.0000 mL | ORAL | Status: DC
  Administered 2022-04-15 – 2022-04-20 (×4): 237 mL via ORAL

## 2022-04-13 MED ORDER — ONDANSETRON HCL 4 MG/2ML IJ SOLN
4.0000 mg | Freq: Once | INTRAMUSCULAR | Status: AC
  Administered 2022-04-13: 4 mg via INTRAVENOUS
  Filled 2022-04-13: qty 2

## 2022-04-13 MED ORDER — ONDANSETRON HCL 4 MG/2ML IJ SOLN
4.0000 mg | Freq: Four times a day (QID) | INTRAMUSCULAR | Status: DC | PRN
  Administered 2022-04-18 (×2): 4 mg via INTRAVENOUS
  Filled 2022-04-13 (×2): qty 2

## 2022-04-13 MED ORDER — ACETAMINOPHEN 325 MG PO TABS: 650.0000 mg | ORAL_TABLET | Freq: Four times a day (QID) | ORAL | Status: DC | PRN

## 2022-04-13 MED ORDER — ACETAMINOPHEN 500 MG PO TABS
1000.0000 mg | ORAL_TABLET | Freq: Three times a day (TID) | ORAL | Status: DC
  Administered 2022-04-13 – 2022-04-22 (×27): 1000 mg via ORAL
  Filled 2022-04-13 (×27): qty 2

## 2022-04-13 MED ORDER — AMLODIPINE BESYLATE 10 MG PO TABS
10.0000 mg | ORAL_TABLET | Freq: Every day | ORAL | Status: DC
  Administered 2022-04-14 – 2022-04-22 (×8): 10 mg via ORAL
  Filled 2022-04-13 (×8): qty 1

## 2022-04-13 MED ORDER — DOCUSATE SODIUM 100 MG PO CAPS
100.0000 mg | ORAL_CAPSULE | Freq: Two times a day (BID) | ORAL | Status: DC
  Administered 2022-04-13 – 2022-04-14 (×4): 100 mg via ORAL
  Filled 2022-04-13 (×4): qty 1

## 2022-04-13 MED ORDER — METHOCARBAMOL 500 MG PO TABS
500.0000 mg | ORAL_TABLET | Freq: Four times a day (QID) | ORAL | Status: DC | PRN
  Administered 2022-04-13 – 2022-04-20 (×6): 500 mg via ORAL
  Filled 2022-04-13 (×6): qty 1

## 2022-04-13 MED ORDER — SODIUM CHLORIDE 0.9 % IV BOLUS
1000.0000 mL | Freq: Once | INTRAVENOUS | Status: AC
  Administered 2022-04-13: 1000 mL via INTRAVENOUS

## 2022-04-13 MED ORDER — FERROUS SULFATE 325 (65 FE) MG PO TABS
325.0000 mg | ORAL_TABLET | Freq: Two times a day (BID) | ORAL | Status: DC
  Administered 2022-04-13 – 2022-04-22 (×17): 325 mg via ORAL
  Filled 2022-04-13 (×17): qty 1

## 2022-04-13 MED ORDER — MORPHINE SULFATE (PF) 4 MG/ML IV SOLN
4.0000 mg | Freq: Once | INTRAVENOUS | Status: AC
  Administered 2022-04-13: 4 mg via INTRAVENOUS
  Filled 2022-04-13: qty 1

## 2022-04-13 MED ORDER — DOXAZOSIN MESYLATE 4 MG PO TABS
2.0000 mg | ORAL_TABLET | Freq: Every evening | ORAL | Status: DC
  Administered 2022-04-13 – 2022-04-22 (×10): 2 mg via ORAL
  Filled 2022-04-13 (×10): qty 1

## 2022-04-13 MED ORDER — ATORVASTATIN CALCIUM 40 MG PO TABS
40.0000 mg | ORAL_TABLET | Freq: Every evening | ORAL | Status: DC
  Administered 2022-04-13 – 2022-04-22 (×11): 40 mg via ORAL
  Filled 2022-04-13 (×2): qty 2
  Filled 2022-04-13: qty 1
  Filled 2022-04-13: qty 2
  Filled 2022-04-13 (×5): qty 1
  Filled 2022-04-13: qty 2

## 2022-04-13 MED ORDER — ENOXAPARIN SODIUM 40 MG/0.4ML IJ SOSY
40.0000 mg | PREFILLED_SYRINGE | INTRAMUSCULAR | Status: DC
  Administered 2022-04-13 – 2022-04-20 (×8): 40 mg via SUBCUTANEOUS
  Filled 2022-04-13 (×9): qty 0.4

## 2022-04-13 NOTE — ED Triage Notes (Signed)
Pt BIB [facility] From [facility] SNF with c/o fracture femur. Hx anemia, CKD, pre-diabetic.   BP 122/80 HR 88 98% RA RR 20 Clear  T 96.8

## 2022-04-13 NOTE — H&P (Addendum)
History and Physical    Frank Hernandez [id number] DOB: 05-21-1940 DOA: 04/13/2022  PCP: [name] (Inactive)   I have briefly reviewed patients previous medical reports in [facility].  Patient coming from: SNF  Chief Complaint: Right hip pain  HPI: Frank Hernandez is a 82 year old married male, ambulated with the help of walker prior to recent hospital admission, medical history significant for essential hypertension, hyperlipidemia, stage IIIa CKD, prediabetes, s/p left intramedullary intertrochanteric nailing for hip fracture on 06/11/2020, recent hospitalization 03/29/2022 - 04/05/2022 due to nontraumatic right knee pain and effusion, evaluated by orthopedics, underwent aspiration with steroid injection, eventually felt to be secondary to osteoarthritis, then discharged to SNF and now returns to the ED on 04/13/2022 with complaints of right hip pain.  Since discharge to the SNF, patient denies falls or trauma.  Over the weekend, when therapies worked with him, he started experiencing right hip pain, progressively worsened to 10/10 in severity, worsened by movements.  Denies tingling or numbness.  States that he still has right knee pain and not clear if this is better or about the same since hospital discharge.  No other complaints reported.  ED Course: Stable vitals.  Lab work significant for BUN 41, creatinine 1.59, about the same as it was during recent hospitalization.  Hemoglobin 9.1.  X-ray of the right knee and pelvis shows subtrochanteric fracture of the right femur.  X-ray of the right knee shows decreased soft tissue swelling and no sizable residual visible joint effusion, no acute osseous abnormality and severe medial compartment osteoarthrosis.  EDP has consulted orthopedics/Dr. Ileene Rubens, has requested a CT of the right hip for better delineation of the fracture, plans on possible surgery on 2/14 and requests medical admission and n.p.o. after midnight  tonight.  Review of Systems:  All other systems reviewed and apart from HPI, are negative.  Past Medical History:  Diagnosis Date   Bleeding from the nose    Hypertension    Pre-diabetes     Past Surgical History:  Procedure Laterality Date   COLONOSCOPY     INTRAMEDULLARY (IM) NAIL INTERTROCHANTERIC Left 06/11/2020   Procedure: INTRAMEDULLARY (IM) NAIL INTERTROCHANTRIC;  Surgeon: Shona Needles, MD;  Location: [facility];  Service: Orthopedics;  Laterality: Left;   KNEE ARTHROSCOPY Bilateral    POLYPECTOMY      Social History  reports that he has never smoked. He has never used smokeless tobacco. He reports that he does not drink alcohol and does not use drugs.  No Known Allergies  Family History  Problem Relation Age of Onset   Colon cancer Neg Hx      Prior to Admission medications   Medication Sig Start Date End Date Taking? Authorizing Provider  acetaminophen (TYLENOL) 325 MG tablet Take 2 tablets (650 mg total) by mouth every 6 (six) hours as needed for mild pain or headache (or Fever >/= 101). 04/01/22   Mercy Riding, MD  amLODipine (NORVASC) 10 MG tablet Take 10 mg by mouth daily. Patient not taking: Reported on 06/17/2021    [provider]  DOXAZOSIN MESYLATE PO Take 2 mg by mouth every evening.    [provider]  feeding supplement (ENSURE ENLIVE / ENSURE PLUS) LIQD Take 237 mLs by mouth 2 (two) times daily between meals. 04/03/22   Mercy Riding, MD  ferrous sulfate 325 (65 FE) MG EC tablet Take 1 tablet (325 mg total) by mouth 2 (two) times daily. 04/03/22 09/30/22  Mercy Riding,  MD  Multiple Vitamins-Iron (MULTIVITAMINS WITH IRON) TABS tablet Take 1 tablet by mouth daily. 04/04/22   Mercy Riding, MD  rosuvastatin (CRESTOR) 20 MG tablet Take 20 mg by mouth daily. Patient not taking: Reported on 06/17/2021    [provider]  senna-docusate (SENOKOT-S) 8.6-50 MG tablet Take 1 tablet by mouth at bedtime as needed for mild constipation. 04/01/22    Mercy Riding, MD  traMADol (ULTRAM) 50 MG tablet Take 1 tablet (50 mg total) by mouth every 8 (eight) hours as needed for moderate pain. 04/01/22   Mercy Riding, MD    Physical Exam: Vitals:   04/13/22 1016  BP: (!) 146/90  Pulse: 76  Resp: 17  Temp: 97.7 F (36.5 C)  TempSrc: Oral  SpO2: 98%      Constitutional: Elderly male, moderately built and nourished lying comfortably propped up in bed without distress. Eyes: PERTLA, lids and conjunctivae normal.  Bilateral immature cataracts and arcus senalis. ENMT: Mucous membranes are moist. Posterior pharynx clear of any exudate or lesions.  Edentulous. Neck: supple, no masses, no thyromegaly Respiratory: Clear to auscultation without wheezing, rhonchi or crackles. No increased work of breathing. Cardiovascular: S1 & S2 heard, regular rate and rhythm. No JVD, murmurs, rubs or clicks. No pedal edema. Abdomen: Non distended. Non tender. Soft. No organomegaly or masses appreciated. No clinical Ascites. Normal bowel sounds heard. Musculoskeletal: no clubbing / cyanosis. No joint deformity upper and lower extremities. Good ROM, no contractures. Normal muscle tone.  Skin: no rashes, lesions, ulcers. No induration Neurologic: CN 2-12 grossly intact. Sensation intact, DTR normal. Extremities: Right knee with chronic mild boggy swelling compared to the left without increased warmth, tenderness or erythema.  Still has Band-Aid on the lateral aspect from recent joint aspiration and steroid injection.  Right lower extremity appears to be slightly shortened and externally rotated.  Not sure if this is true or the way patient is lying in bed.  Movements of right lower extremity restricted due to pain. Psychiatric: Normal judgment and insight. Alert and oriented x 3. Normal mood.     Labs on Admission: I have personally reviewed following labs and imaging studies  CBC: Recent Labs  Lab 04/13/22 1101  WBC 8.3  NEUTROABS 6.9  HGB 9.1*  HCT 27.9*   MCV 93.3  PLT 0000000    Basic Metabolic Panel: Recent Labs  Lab 04/13/22 1101  NA 135  K 4.3  CL 104  CO2 22  GLUCOSE 136*  BUN 41*  CREATININE 1.59*  CALCIUM 9.0    Radiological Exams on Admission: DG Femur 1V Right  Result Date: 04/13/2022 CLINICAL DATA:  Right leg pain. EXAM: RIGHT FEMUR 1 VIEW COMPARISON:  Right hip and knee radiographs today. FINDINGS: An acute, displaced subtrochanteric fracture of the right femur is again seen. The more distal femur appears intact. The knee is reported separately. Soft tissue swelling is noted. IMPRESSION: Subtrochanteric fracture of the right femur. Electronically Signed   By: Logan Bores M.D.   On: 04/13/2022 11:43   DG Knee Complete 4 Views Right  Result Date: 04/13/2022 CLINICAL DATA:  Right leg pain. EXAM: RIGHT KNEE - COMPLETE 4+ VIEW COMPARISON:  Right knee radiographs 03/29/2022 FINDINGS: No acute fracture, dislocation, or sizable residual knee joint effusion is identified. Tricompartmental osteoarthrosis is similar to the prior study, including severe medial compartment joint space narrowing. Soft tissue swelling about the knee has decreased. IMPRESSION: 1. Decreased soft tissue swelling and no sizable residual visible joint effusion. 2.  No acute osseous abnormality. 3. Severe medial compartment osteoarthrosis. Electronically Signed   By: Logan Bores M.D.   On: 04/13/2022 11:40   DG Hip Unilat With Pelvis 2-3 Views Right  Result Date: 04/13/2022 CLINICAL DATA:  Right leg pain. EXAM: DG HIP (WITH OR WITHOUT PELVIS) 2-3V RIGHT COMPARISON:  None Available. FINDINGS: There is an acute, displaced subtrochanteric fracture of the right femur. The femoral head remains approximated with the acetabulum on the AP radiographs. Cross-table lateral radiographs are limited by superimposed tissues and underpenetration. Sequelae of previous left femur ORIF are partially visualized. IMPRESSION: Subtrochanteric fracture of the right femur. Electronically  Signed   By: Logan Bores M.D.   On: 04/13/2022 11:35     Assessment/Plan Principal Problem:   Closed nondisplaced subtrochanteric fracture of right femur (HCC) Active Problems:   Essential hypertension   Pre-diabetes   Chronic kidney disease, stage 3a (HCC)   Normocytic anemia     Closed subtrochanteric fracture of the right femur: No history of trauma or fall.  Appears spontaneous.  Could he have had this fracture on recent hospitalization and knee pain being referred pain but not sure if that would explain the the knee effusion?  No hip or pelvis x-rays were done during that hospital admission.  Check vitamin D level.  Will need outpatient bone density study done.  Orthopedics/Dr. Ileene Rubens consulted by EDP, checking CT of right hip with plans for possible surgery on 2/14.  Multimodality pain control.  N.p.o. after midnight.  Patient has no cardiorespiratory conditions or symptoms, moderate risk just due to his advanced age, HTN but may proceed with indicated surgery without any further cardiac workup with close perioperative monitoring.  Hypertension: Controlled.  Continue prior amlodipine 10 Mg daily and doxazosin 2 Mg every evening pending home med reconciliation by pharmacy.  Hyperlipidemia: Continue Crestor 20 Mg daily  CKD stage IIIa: At previous baseline.  Follow BMP closely while hospitalized.  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  Appreciated anemia workup during recent hospital admission: Iron 37, TIBC 251, saturation ratio 15, ferritin 165, folate 8.6 and B12 426.  Follow CBC while hospitalized.  Prediabetes: Check A1c.  Recent right knee pain and swelling: Improved.  Suspected due to osteoarthritis.  DVT prophylaxis: Lovenox Code Status: Full, confirmed with patient in the presence of his spouse at bedside. Family Communication: Spouse at bedside. Disposition Plan:   Patient is from:  SNF  Anticipated DC to:  SNF  Anticipated DC date:  3 to 4  days  Anticipated DC barriers: None   Consults called: Orthopedics Admission status: Inpatient/medical bed  Severity of Illness: The appropriate patient status for this patient is INPATIENT. Inpatient status is judged to be reasonable and necessary in order to provide the required intensity of service to ensure the patient's safety. The patient's presenting symptoms, physical exam findings, and initial radiographic and laboratory data in the context of their chronic comorbidities is felt to place them at high risk for further clinical deterioration. Furthermore, it is not anticipated that the patient will be medically stable for discharge from the hospital within 2 midnights of admission.   * I certify that at the point of admission it is my clinical judgment that the patient will require inpatient hospital care spanning beyond 2 midnights from the point of admission due to high intensity of service, high risk for further deterioration and high frequency of surveillance required.Vernell Leep MD FACP, [place], [place], [place], [place]  [facility]    To contact the attending provider between 7A-7P or the covering provider during after hours 7P-7A, please log into the web site [email or web address] and access using universal [place] password for that web site. If you do not have the password, please call the hospital operator.  04/13/2022, 1:11 PM

## 2022-04-13 NOTE — ED Provider Notes (Signed)
[facility] 3 EAST GENERAL SURGERY Provider Note  CSN: [id number] Arrival date & time: 04/13/22 [id number]  Chief Complaint(s) Right Femur Fracture   HPI Frank Hernandez is a 82 y.o. male with history of chronic kidney disease, hypertension presenting to the emergency department with apparent fracture.  Patient was recently admitted to the hospital for knee pain, inability to ambulate.  Patient was discharged to SNF as he was unable to ambulate.  Patient reports he continues to have right knee pain.  At baseline, he ambulates with a walker.  He denies any new falls or injuries since discharge.  He reports yesterday he began having pain in his upper right leg as well has continued knee pain.  They apparently did an x-ray last night which showed a fracture.  He denies any numbness or tingling.  No nausea or vomiting.  No headaches or head injuries.  Symptoms are worse with movement.   Past Medical History Past Medical History:  Diagnosis Date   Bleeding from the nose    Hypertension    Pre-diabetes    Patient Active Problem List   Diagnosis Date Noted   Closed nondisplaced subtrochanteric fracture of right femur ([place]) 04/13/2022   Normocytic anemia 04/13/2022   Chronic kidney disease, stage 3a ([place]) 04/03/2022   Decreased oral intake 04/02/2022   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   Essential hypertension 04/01/2022   Pain in joint, lower leg 04/01/2022   Effusion of right knee 03/30/2022   Pressure injury of skin 03/30/2022   Effusion, right knee 03/30/2022   Concussion with unknown loss of consciousness status 12/24/2020   Hypokalemia    Rib fractures 06/10/2020   Severe essential hypertension 11/15/2018   Syncope 12/26/2015   Pre-diabetes 12/26/2015   Home Medication(s) Prior to Admission medications   Medication Sig Start Date End Date Taking? Authorizing Provider  amLODipine (NORVASC) 10 MG tablet Take 10 mg by mouth daily.   Yes [provider]  atorvastatin  (LIPITOR) 40 MG tablet Take 40 mg by mouth every evening.   Yes [provider]  DOXAZOSIN MESYLATE PO Take 2 mg by mouth every evening.   Yes [provider]  feeding supplement (ENSURE ENLIVE / ENSURE PLUS) LIQD Take 237 mLs by mouth 2 (two) times daily between meals. Patient taking differently: Take 237 mLs by mouth daily. May use Boost 04/03/22  Yes Mercy Riding, MD  ferrous sulfate 325 (65 FE) MG EC tablet Take 1 tablet (325 mg total) by mouth 2 (two) times daily. 04/03/22 09/30/22 Yes Mercy Riding, MD  HYDROcodone-acetaminophen (NORCO/VICODIN) 5-325 MG tablet Take 1 tablet by mouth every 6 (six) hours as needed for moderate pain.   Yes [provider]  Multiple Vitamins-Iron (MULTIVITAMINS WITH IRON) TABS tablet Take 1 tablet by mouth daily. 04/04/22  Yes Mercy Riding, MD  senna-docusate (SENOKOT-S) 8.6-50 MG tablet Take 1 tablet by mouth at bedtime as needed for mild constipation. 04/01/22  Yes Mercy Riding, MD  acetaminophen (TYLENOL) 325 MG tablet Take 2 tablets (650 mg total) by mouth every 6 (six) hours as needed for mild pain or headache (or Fever >/= 101). Patient not taking: Reported on 04/13/2022 04/01/22   Mercy Riding, MD  traMADol (ULTRAM) 50 MG tablet Take 1 tablet (50 mg total) by mouth every 8 (eight) hours as needed for moderate pain. Patient not taking: Reported on 04/13/2022 04/01/22   Mercy Riding, MD  Past Surgical History Past Surgical History:  Procedure Laterality Date   COLONOSCOPY     INTRAMEDULLARY (IM) NAIL INTERTROCHANTERIC Left 06/11/2020   Procedure: INTRAMEDULLARY (IM) NAIL INTERTROCHANTRIC;  Surgeon: Shona Needles, MD;  Location: [place];  Service: Orthopedics;  Laterality: Left;   KNEE ARTHROSCOPY Bilateral    POLYPECTOMY     Family History Family History  Problem Relation Age of Onset   Colon cancer Neg Hx      Social History Social History   Tobacco Use   Smoking status: Never   Smokeless tobacco: Never  Vaping Use   Vaping Use: Never used  Substance Use Topics   Alcohol use: No    Alcohol/week: 0.0 standard drinks of alcohol   Drug use: No   Allergies Patient has no known allergies.  Review of Systems Review of Systems  All other systems reviewed and are negative.   Physical Exam Vital Signs  I have reviewed the triage vital signs BP (!) 156/80 (BP Location: Left Arm)   Pulse 90   Temp 98.4 F (36.9 C) (Oral)   Resp 17   SpO2 99%  Physical Exam Vitals and nursing note reviewed.  Constitutional:      General: He is not in acute distress.    Appearance: Normal appearance.  HENT:     Mouth/Throat:     Mouth: Mucous membranes are moist.  Eyes:     Conjunctiva/sclera: Conjunctivae normal.  Cardiovascular:     Rate and Rhythm: Normal rate and regular rhythm.  Pulmonary:     Effort: Pulmonary effort is normal. No respiratory distress.     Breath sounds: Normal breath sounds.  Abdominal:     General: Abdomen is flat.     Palpations: Abdomen is soft.     Tenderness: There is no abdominal tenderness.  Musculoskeletal:     Right lower leg: No edema.     Left lower leg: No edema.     Comments: Very painful range of motion of the right hip.  Painful range of motion of the right knee.  Right knee effusion, mild warmth, no erythema, able to range some at the knee.  Skin:    General: Skin is warm and dry.     Capillary Refill: Capillary refill takes less than 2 seconds.  Neurological:     Mental Status: He is alert and oriented to person, place, and time. Mental status is at baseline.  Psychiatric:        Mood and Affect: Mood normal.        Behavior: Behavior normal.     ED Results and Treatments Labs (all labs ordered are listed, but only abnormal results are displayed) Labs Reviewed  CBC WITH DIFFERENTIAL/PLATELET - Abnormal; Notable for the following  components:      Result Value   RBC 2.99 (*)    Hemoglobin 9.1 (*)    HCT 27.9 (*)    Abs Immature Granulocytes 0.13 (*)    All other components within normal limits  BASIC METABOLIC PANEL - Abnormal; Notable for the following components:   Glucose, Bld 136 (*)    BUN 41 (*)    Creatinine, Ser 1.59 (*)    GFR, Estimated 44 (*)    All other components within normal limits  PROTIME-INR  VITAMIN D 25 HYDROXY (VIT D DEFICIENCY, FRACTURES)  HEMOGLOBIN A1C  Radiology CT Hip Right Wo Contrast  Result Date: 04/13/2022 CLINICAL DATA:  Right femur fracture. EXAM: CT OF THE RIGHT HIP WITHOUT CONTRAST TECHNIQUE: Multidetector CT imaging of the right hip was performed according to the standard protocol. Multiplanar CT image reconstructions were also generated. RADIATION DOSE REDUCTION: This exam was performed according to the departmental dose-optimization program which includes automated exposure control, adjustment of the mA and/or kV according to patient size and/or use of iterative reconstruction technique. COMPARISON:  Radiographs of same day. FINDINGS: Severely displaced fracture is seen involving the intertrochanteric region of the proximal right femur. There is seen lucency involving the right femoral neck which extends into trochanteric region and possible lytic destruction or malignancy cannot be excluded. There is a cortical defect involving the right inferior pubic ramus concerning for possible lytic lesion. Large right inguinal hernia is noted which appears to contain small bowel and a portion of the urinary bladder. IMPRESSION: Severely displaced fracture is seen involving the intertrochanteric region of the proximal right femur. There does appear to be lucency in the right femoral neck and intertrochanteric region suggesting the possibility of lytic lesion and pathologic  fracture. There does appear to be cortical defect involving the right inferior pubic ramus suggesting possible metastatic lesion in this area. MRI may be performed for further evaluation. Also noted is large right inguinal hernia which appears to contain small bowel and a portion of the urinary bladder. Electronically Signed   By: Marijo Conception M.D.   On: 04/13/2022 15:50   DG Femur 1V Right  Result Date: 04/13/2022 CLINICAL DATA:  Right leg pain. EXAM: RIGHT FEMUR 1 VIEW COMPARISON:  Right hip and knee radiographs today. FINDINGS: An acute, displaced subtrochanteric fracture of the right femur is again seen. The more distal femur appears intact. The knee is reported separately. Soft tissue swelling is noted. IMPRESSION: Subtrochanteric fracture of the right femur. Electronically Signed   By: Logan Bores M.D.   On: 04/13/2022 11:43   DG Knee Complete 4 Views Right  Result Date: 04/13/2022 CLINICAL DATA:  Right leg pain. EXAM: RIGHT KNEE - COMPLETE 4+ VIEW COMPARISON:  Right knee radiographs 03/29/2022 FINDINGS: No acute fracture, dislocation, or sizable residual knee joint effusion is identified. Tricompartmental osteoarthrosis is similar to the prior study, including severe medial compartment joint space narrowing. Soft tissue swelling about the knee has decreased. IMPRESSION: 1. Decreased soft tissue swelling and no sizable residual visible joint effusion. 2. No acute osseous abnormality. 3. Severe medial compartment osteoarthrosis. Electronically Signed   By: Logan Bores M.D.   On: 04/13/2022 11:40   DG Hip Unilat With Pelvis 2-3 Views Right  Result Date: 04/13/2022 CLINICAL DATA:  Right leg pain. EXAM: DG HIP (WITH OR WITHOUT PELVIS) 2-3V RIGHT COMPARISON:  None Available. FINDINGS: There is an acute, displaced subtrochanteric fracture of the right femur. The femoral head remains approximated with the acetabulum on the AP radiographs. Cross-table lateral radiographs are limited by superimposed  tissues and underpenetration. Sequelae of previous left femur ORIF are partially visualized. IMPRESSION: Subtrochanteric fracture of the right femur. Electronically Signed   By: Logan Bores M.D.   On: 04/13/2022 11:35    Pertinent labs & imaging results that were available during my care of the patient were reviewed by me and considered in my medical decision making (see MDM for details).  Medications Ordered in ED Medications  morphine (PF) 2 MG/ML injection 2-4 mg (has no administration in time range)  ondansetron (ZOFRAN) injection 4 mg (has  no administration in time range)  traMADol (ULTRAM) tablet 50 mg (50 mg Oral Given 04/13/22 1427)  enoxaparin (LOVENOX) injection 40 mg (has no administration in time range)  sodium chloride flush (NS) 0.9 % injection 3 mL (has no administration in time range)  docusate sodium (COLACE) capsule 100 mg (100 mg Oral Given 04/13/22 1427)  albuterol (PROVENTIL) (2.5 MG/3ML) 0.083% nebulizer solution 2.5 mg (has no administration in time range)  acetaminophen (TYLENOL) tablet 1,000 mg (has no administration in time range)  methocarbamol (ROBAXIN) tablet 500 mg (has no administration in time range)  amLODipine (NORVASC) tablet 10 mg (has no administration in time range)  atorvastatin (LIPITOR) tablet 40 mg (has no administration in time range)  doxazosin (CARDURA) tablet 2 mg (has no administration in time range)  feeding supplement (ENSURE ENLIVE / ENSURE PLUS) liquid 237 mL (has no administration in time range)  ferrous sulfate tablet 325 mg (has no administration in time range)  multivitamins with iron tablet 1 tablet (has no administration in time range)  sodium chloride 0.9 % bolus 1,000 mL (1,000 mLs Intravenous New Bag/Given 04/13/22 1103)  ondansetron (ZOFRAN) injection 4 mg (4 mg Intravenous Given 04/13/22 1104)  morphine (PF) 4 MG/ML injection 4 mg (4 mg Intravenous Given 04/13/22 1104)                                                                                                                                      Procedures Procedures  (including critical care time)  Medical Decision Making / ED Course   MDM:  82 year old male presenting to the emergency department with apparent fracture.  Patient well-appearing, on physical exam, has significant pain with range of motion of the right hip and tenderness around the right hip.  Will obtain x-rays to further evaluate given reported fracture at outside facility.  Reviewed previous testing from inpatient stay including MRI knee.  Unclear when patient developed fracture given no reported history of falls.  Will treat pain, obtain basic labs to reassess. Clinical Course as of 04/13/22 1605  Tue Apr 13, 2022  1159 Discussed with Dr. Laurance Flatten. Given no trauma requests CT to evaluate for other inciting factor such as occult malignancy. Plan for surgery tomorrow. NPO after midnight. Will consult hospitalist.  [WS]    Clinical Course User Index [WS] Cristie Hem, MD     Additional history obtained: -Additional history obtained from ems -External records from outside source obtained and reviewed including: Chart review including previous notes, labs, imaging, consultation notes including recent discharge summary   Lab Tests: -I ordered, reviewed, and interpreted labs.   The pertinent results include:   Labs Reviewed  CBC WITH DIFFERENTIAL/PLATELET - Abnormal; Notable for the following components:      Result Value   RBC 2.99 (*)    Hemoglobin 9.1 (*)    HCT 27.9 (*)    Abs Immature Granulocytes 0.13 (*)  All other components within normal limits  BASIC METABOLIC PANEL - Abnormal; Notable for the following components:   Glucose, Bld 136 (*)    BUN 41 (*)    Creatinine, Ser 1.59 (*)    GFR, Estimated 44 (*)    All other components within normal limits  PROTIME-INR  VITAMIN D 25 HYDROXY (VIT D DEFICIENCY, FRACTURES)  HEMOGLOBIN A1C    Notable for stable CKD, chronic  anemia   Imaging Studies ordered: I ordered imaging studies including XR femur On my interpretation imaging demonstrates hip fracture I independently visualized and interpreted imaging. I agree with the radiologist interpretation   Medicines ordered and prescription drug management: Meds ordered this encounter  Medications   sodium chloride 0.9 % bolus 1,000 mL   ondansetron (ZOFRAN) injection 4 mg   morphine (PF) 4 MG/ML injection 4 mg   morphine (PF) 2 MG/ML injection 2-4 mg   ondansetron (ZOFRAN) injection 4 mg   DISCONTD: acetaminophen (TYLENOL) tablet 650 mg   traMADol (ULTRAM) tablet 50 mg   enoxaparin (LOVENOX) injection 40 mg   sodium chloride flush (NS) 0.9 % injection 3 mL   docusate sodium (COLACE) capsule 100 mg   albuterol (PROVENTIL) (2.5 MG/3ML) 0.083% nebulizer solution 2.5 mg   acetaminophen (TYLENOL) tablet 1,000 mg   methocarbamol (ROBAXIN) tablet 500 mg   amLODipine (NORVASC) tablet 10 mg   atorvastatin (LIPITOR) tablet 40 mg   doxazosin (CARDURA) tablet 2 mg   feeding supplement (ENSURE ENLIVE / ENSURE PLUS) liquid 237 mL    Patient taking differently: May use Boost     ferrous sulfate tablet 325 mg   multivitamins with iron tablet 1 tablet    -I have reviewed the patients home medicines and have made adjustments as needed   Consultations Obtained: I requested consultation with the orthopedic surgeon Dr. Laurance Flatten,  and discussed lab and imaging findings as well as pertinent plan - they recommend: admit to medicine, repair likely tomorrow   Cardiac Monitoring: The patient was maintained on a cardiac monitor.  I personally viewed and interpreted the cardiac monitored which showed an underlying rhythm of: NSR  Reevaluation: After the interventions noted above, I reevaluated the patient and found that they have improved  Co morbidities that complicate the patient evaluation  Past Medical History:  Diagnosis Date   Bleeding from the nose     Hypertension    Pre-diabetes       Dispostion: Disposition decision including need for hospitalization was considered, and patient admitted to the hospital.    Final Clinical Impression(s) / ED Diagnoses Final diagnoses:  Closed displaced subtrochanteric fracture of right femur, initial encounter [facility])     This chart was dictated using voice recognition software.  Despite best efforts to proofread,  errors can occur which can change the documentation meaning.    Cristie Hem, MD 04/13/22 [id number]

## 2022-04-14 ENCOUNTER — Encounter (HOSPITAL_COMMUNITY): Payer: Self-pay | Admitting: Internal Medicine

## 2022-04-14 ENCOUNTER — Inpatient Hospital Stay (HOSPITAL_COMMUNITY): Payer: Medicare PPO

## 2022-04-14 DIAGNOSIS — C419 Malignant neoplasm of bone and articular cartilage, unspecified: Secondary | ICD-10-CM

## 2022-04-14 DIAGNOSIS — D649 Anemia, unspecified: Secondary | ICD-10-CM | POA: Diagnosis not present

## 2022-04-14 DIAGNOSIS — I10 Essential (primary) hypertension: Secondary | ICD-10-CM | POA: Diagnosis not present

## 2022-04-14 DIAGNOSIS — M84551A Pathological fracture in neoplastic disease, right femur, initial encounter for fracture: Secondary | ICD-10-CM

## 2022-04-14 DIAGNOSIS — S7224XP Nondisplaced subtrochanteric fracture of right femur, subsequent encounter for closed fracture with malunion: Secondary | ICD-10-CM

## 2022-04-14 DIAGNOSIS — S7221XA Displaced subtrochanteric fracture of right femur, initial encounter for closed fracture: Secondary | ICD-10-CM

## 2022-04-14 DIAGNOSIS — N1831 Chronic kidney disease, stage 3a: Secondary | ICD-10-CM | POA: Diagnosis not present

## 2022-04-14 DIAGNOSIS — R7303 Prediabetes: Secondary | ICD-10-CM

## 2022-04-14 LAB — CBC
HCT: 24.3 % — ABNORMAL LOW (ref 39.0–52.0)
Hemoglobin: 8 g/dL — ABNORMAL LOW (ref 13.0–17.0)
MCH: 30.9 pg (ref 26.0–34.0)
MCHC: 32.9 g/dL (ref 30.0–36.0)
MCV: 93.8 fL (ref 80.0–100.0)
Platelets: 213 10*3/uL (ref 150–400)
RBC: 2.59 MIL/uL — ABNORMAL LOW (ref 4.22–5.81)
RDW: 13.7 % (ref 11.5–15.5)
WBC: 8.1 10*3/uL (ref 4.0–10.5)
nRBC: 0 % (ref 0.0–0.2)

## 2022-04-14 LAB — BASIC METABOLIC PANEL
Anion gap: 7 (ref 5–15)
BUN: 37 mg/dL — ABNORMAL HIGH (ref 8–23)
CO2: 22 mmol/L (ref 22–32)
Calcium: 9 mg/dL (ref 8.9–10.3)
Chloride: 107 mmol/L (ref 98–111)
Creatinine, Ser: 1.51 mg/dL — ABNORMAL HIGH (ref 0.61–1.24)
GFR, Estimated: 46 mL/min — ABNORMAL LOW (ref >60)
Glucose, Bld: 94 mg/dL (ref 70–99)
Potassium: 4.9 mmol/L (ref 3.5–5.1)
Sodium: 136 mmol/L (ref 135–145)

## 2022-04-14 LAB — LACTATE DEHYDROGENASE: LDH: 172 U/L (ref 98–192)

## 2022-04-14 LAB — PSA: Prostatic Specific Antigen: 8.56 ng/mL — ABNORMAL HIGH (ref 0.00–4.00)

## 2022-04-14 MED ORDER — IOHEXOL 9 MG/ML PO SOLN
500.0000 mL | ORAL | Status: AC
  Administered 2022-04-14: 500 mL via ORAL

## 2022-04-14 MED ORDER — IOHEXOL 9 MG/ML PO SOLN
ORAL | Status: AC
  Administered 2022-04-14: 500 mL
  Filled 2022-04-14: qty 1000

## 2022-04-14 NOTE — Progress Notes (Signed)
PROGRESS NOTE    Frank Hernandez  [id number] DOB: Feb 03, 1941 DOA: 04/13/2022 PCP: [name] (Inactive)    Chief Complaint  Patient presents with   Right Femur Fracture     Brief Narrative:  Patient is a pleasant 82 year old gentleman, ambulates with the help of a walker recent hospitalization, history of hypertension hyperlipidemia CKD stage IIIa, prediabetes, status post left intramedullary intertrochanteric nailing for hip fracture 06/28/2020, recent hospitalization for nontraumatic right knee pain and effusion 03/29/2022-04/05/2022 whereby he underwent aspiration and steroid injection felt secondary to osteoarthritis sent to SNF presenting back with right hip pain.  Plain films of the right hip and pelvis showed subtrochanteric fracture of the right femur, plain films of the right knee with decreased soft tissue swelling no sizable residual visible joint effusion, no acute osseous abnormality, severe medial compartment osteoarthrosis.  CT right hip done with concerns for pathologic fracture, lytic lesions noted.  Patient seen in consultation by orthopedics who have ordered a CT chest abdomen and pelvis, SPEP. -Hematology/oncology consultation pending.   Assessment & Plan:   Principal Problem:   Closed nondisplaced subtrochanteric fracture of right femur (HCC) Active Problems:   Essential hypertension   Pre-diabetes   Chronic kidney disease, stage 3a (HCC)   Normocytic anemia  #1 closed subtrochanteric right femur fracture -Patient presenting from SNF with complaints of right hip pain, no recent fall or trauma noted noted to be spontaneous. -Plain films of the right femur done with subtrochanteric fracture of the right femur. -Plain films of the right hip and pelvis with subtrochanteric fracture of the right femur. -Orthopedics consulted by EDP, patient seen in consultation by Dr. Laurance Flatten who recommended CT right hip with plans for possible surgery today 04/14/2022. -CT right  hip done with severely displaced fracture involving the intratrochanteric region of proximal right femur, lucency noted in the right femoral neck and intertrochanteric region suggesting the possibility of lytic lesion and pathologic fracture.  There appears to be cortical defect involving the right inferior pubic ramus suggesting possible metastatic lesion in that area.  MRI may be performed for further evaluation. -CT chest abdomen and pelvis already ordered per orthopedics as well as a SPEP. -Check a UPEP. -Consult with oncology due to concerns for metastatic lesion. -NWB RLE -Per orthopedics no plans for surgery today pending workup. -Per orthopedics.  2.  Hypertension -Controlled on home regimen Norvasc, Cardura.  3.  CKD stage IIIa -Stable. -Patient and wife deny any prior history of chronic kidney disease. -Follow-up.  4.  Hyperlipidemia -Statin.  5.  Anemia of chronic disease -Patient with no overt bleeding. -Hemoglobin currently 8.0. -Anemia panel done 04/02/2022 with iron of 37, TIBC 251, ferritin 165, folate 8.6. -Follow H&H. -Transfusion threshold hemoglobin < 7.  6.  Prediabetes -Hemoglobin A1c 6.0. -Outpatient follow-up.   DVT prophylaxis: Lovenox Code Status: Full Family Communication: Updated patient and wife at bedside. Disposition: Likely SNF once workup is completed, postoperatively, when cleared by orthopedics and hematology/oncology.  Status is: Inpatient Remains inpatient appropriate because: Severity of illness   Consultants:  Orthopedics: Dr. Laurance Flatten 04/14/2022 Oncology pending  Procedures:  CT right hip 04/13/2022 Plain films of the right femur 04/13/2022 Plain films of the right knee 04/13/2022  Antimicrobials:  Anti-infectives (From admission, onward)    None         Subjective: Patient laying in bed.  No chest pain.  No shortness of breath.  No abdominal pain.  Still with complaints of right hip pain.  Drinking contrast in preparation for  CT chest abdomen and pelvis.  Wife at bedside.  Objective: Vitals:   04/13/22 1953 04/14/22 0120 04/14/22 0542 04/14/22 1310  BP: 136/65 128/65 123/66 132/70  Pulse: 86 76 70 79  Resp: 16 18 16 18  $ Temp: 98.5 F (36.9 C) 98.3 F (36.8 C) 98.3 F (36.8 C) 98.8 F (37.1 C)  TempSrc: Oral Oral Oral Oral  SpO2: 98% 99% 99% 98%    Intake/Output Summary (Last 24 hours) at 04/14/2022 1625 Last data filed at 04/14/2022 1400 Gross per 24 hour  Intake 300 ml  Output 1200 ml  Net -900 ml   There were no vitals filed for this visit.  Examination:  General exam: Appears calm and comfortable  Respiratory system: Clear to auscultation anterior lung fields.  No wheezes, no crackles, no rhonchi.  Fair air movement.  Speaking in full sentences.  Respiratory effort normal. Cardiovascular system: S1 & S2 heard, RRR. No JVD, murmurs, rubs, gallops or clicks. No pedal edema. Gastrointestinal system: Abdomen is nondistended, soft and nontender. No organomegaly or masses felt. Normal bowel sounds heard. Central nervous system: Alert and oriented. No focal neurological deficits. Extremities: Right hip tender to palpation.  Symmetric 5 x 5 power. Skin: No rashes, lesions or ulcers Psychiatry: Judgement and insight appear normal. Mood & affect appropriate.     Data Reviewed: I have personally reviewed following labs and imaging studies  CBC: Recent Labs  Lab 04/13/22 1101 04/14/22 0439  WBC 8.3 8.1  NEUTROABS 6.9  --   HGB 9.1* 8.0*  HCT 27.9* 24.3*  MCV 93.3 93.8  PLT 254 [id number]    Basic Metabolic Panel: Recent Labs  Lab 04/13/22 1101 04/14/22 0439  NA 135 136  K 4.3 4.9  CL 104 107  CO2 22 22  GLUCOSE 136* 94  BUN 41* 37*  CREATININE 1.59* 1.51*  CALCIUM 9.0 9.0    GFR: Estimated Creatinine Clearance: 46.4 mL/min (A) (by C-G formula based on SCr of 1.51 mg/dL (H)).  Liver Function Tests: No results for input(s): "AST", "ALT", "ALKPHOS", "BILITOT", "PROT", "ALBUMIN" in the  last 168 hours.  CBG: No results for input(s): "GLUCAP" in the last 168 hours.   Recent Results (from the past 240 hour(s))  Surgical pcr screen     Status: None   Collection Time: 04/13/22  8:59 PM   Specimen: Nasal Mucosa; Nasal Swab  Result Value Ref Range Status   MRSA, PCR NEGATIVE NEGATIVE Final   Staphylococcus aureus NEGATIVE NEGATIVE Final    Comment: (NOTE) The Xpert SA Assay (FDA approved for NASAL specimens in patients 67 years of age and older), is one component of a comprehensive surveillance program. It is not intended to diagnose infection nor to guide or monitor treatment. Performed at [facility], [place] [address], [place], [place] [address]          Radiology Studies: CT CHEST ABDOMEN PELVIS WO CONTRAST  Result Date: 04/14/2022 CLINICAL DATA:  Suspected pathologic right hip fracture. EXAM: CT CHEST, ABDOMEN AND PELVIS WITHOUT CONTRAST TECHNIQUE: Multidetector CT imaging of the chest, abdomen and pelvis was performed following the standard protocol without IV contrast. RADIATION DOSE REDUCTION: This exam was performed according to the departmental dose-optimization program which includes automated exposure control, adjustment of the mA and/or kV according to patient size and/or use of iterative reconstruction technique. COMPARISON:  None Available. FINDINGS: CT CHEST FINDINGS Cardiovascular: The heart is normal in size. No pericardial effusion. There is tortuosity, ectasia and calcification of the thoracic aorta.  Three-vessel coronary artery calcifications are noted. Mediastinum/Nodes: No mediastinal or hilar mass or lymphadenopathy. The esophagus is grossly normal. Thyroid gland is unremarkable. Lungs/Pleura: No pulmonary mass or pulmonary nodules. No acute pulmonary process. No pleural effusions or pleural nodules. The central tracheobronchial tree is unremarkable. Musculoskeletal: Underline ostial crow CIS. This makes detection of lytic lesions  somewhat difficult. I do not see any obvious sternal or rib lesions. Could not exclude lytic lesions involving the T9 and T10 vertebral bodies. Asymmetric right-sided gynecomastia noted without obvious breast mass. CT ABDOMEN PELVIS FINDINGS Hepatobiliary: Small hepatic cysts are unchanged since prior CT scan. No worrisome hepatic lesions without contrast. No intrahepatic biliary dilatation. The gallbladder is unremarkable. No common bile duct dilatation. Pancreas: Age related pancreatic atrophy but no mass or acute inflammation. Spleen: Normal size.  No focal lesions. Adrenals/Urinary Tract: No adrenal gland lesions. No worrisome renal lesions without contrast. Stable simple bilateral renal cysts not requiring any further evaluation or follow-up. The bladder is grossly normal. Part of the bladder is in a right inguinal hernia. This is a stable finding. Stomach/Bowel: The stomach, duodenum, small and colon are grossly normal. No acute inflammatory process, mass lesions or obstructive findings. Diverticulum noted at the gastric cardia. Descending and sigmoid colon diverticulosis noted along with moderate stool in the rectum. Vascular/Lymphatic: Scattered atherosclerotic calcification involving the aorta and iliac arteries but no aneurysm. No mesenteric or retroperitoneal mass or adenopathy. Reproductive: The prostate gland and seminal vesicles are grossly. Other: There is a large right inguinal hernia containing small bowel loops and part of the bladder. Musculoskeletal: Subacute displaced right hip fracture again noted with areas of attempted callus formation and heterotopic ossification. There is a cortical defect involving the right inferior pubic ramus and also the left acetabulum consistent with destructive lytic bone disease. There is also a lytic lesion involving the posterior aspect of the L2 vertebral body with cortical destruction and a small amount of tumor in the canal. IMPRESSION: 1. Subacute displaced  right hip fracture with areas of attempted callus formation and heterotopic ossification. 2. 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 Could not exclude lytic lesions involving the T9 and T10 vertebral bodies. Other lytic lesions could be difficult to identify because of underlying osteoporosis. Myeloma could be a consideration. PET-CT may be helpful for further evaluation and to help identify a suitable biopsy site. 3. No obvious primary neoplastic process is identified without contrast. 4. Large right inguinal hernia containing small bowel loops and part of the bladder. 5. No acute pulmonary findings or pulmonary nodules. 6. No acute abdominal/pelvic findings, mass lesions or adenopathy. 7. Aortic atherosclerosis. 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 Electronically Signed   By: Marijo Sanes M.D.   On: 04/14/2022 13:07   CT Hip Right Wo Contrast  Result Date: 04/13/2022 CLINICAL DATA:  Right femur fracture. EXAM: CT OF THE RIGHT HIP WITHOUT CONTRAST TECHNIQUE: Multidetector CT imaging of the right hip was performed according to the standard protocol. Multiplanar CT image reconstructions were also generated. RADIATION DOSE REDUCTION: This exam was performed according to the departmental dose-optimization program which includes automated exposure control, adjustment of the mA and/or kV according to patient size and/or use of iterative reconstruction technique. COMPARISON:  Radiographs of same day. FINDINGS: Severely displaced fracture is seen involving the intertrochanteric region of the proximal right femur. There is seen lucency involving the right femoral neck which extends into trochanteric region and possible lytic destruction or malignancy  cannot be excluded. There is a cortical defect involving the right inferior pubic ramus concerning for possible lytic lesion. Large right inguinal hernia is noted which appears  to contain small bowel and a portion of the urinary bladder. IMPRESSION: Severely displaced fracture is seen involving the intertrochanteric region of the proximal right femur. There does appear to be lucency in the right femoral neck and intertrochanteric region suggesting the possibility of lytic lesion and pathologic fracture. There does appear to be cortical defect involving the right inferior pubic ramus suggesting possible metastatic lesion in this area. MRI may be performed for further evaluation. Also noted is large right inguinal hernia which appears to contain small bowel and a portion of the urinary bladder. Electronically Signed   By: Marijo Conception M.D.   On: 04/13/2022 15:50   DG Femur 1V Right  Result Date: 04/13/2022 CLINICAL DATA:  Right leg pain. EXAM: RIGHT FEMUR 1 VIEW COMPARISON:  Right hip and knee radiographs today. FINDINGS: An acute, displaced subtrochanteric fracture of the right femur is again seen. The more distal femur appears intact. The knee is reported separately. Soft tissue swelling is noted. IMPRESSION: Subtrochanteric fracture of the right femur. Electronically Signed   By: Logan Bores M.D.   On: 04/13/2022 11:43   DG Knee Complete 4 Views Right  Result Date: 04/13/2022 CLINICAL DATA:  Right leg pain. EXAM: RIGHT KNEE - COMPLETE 4+ VIEW COMPARISON:  Right knee radiographs 03/29/2022 FINDINGS: No acute fracture, dislocation, or sizable residual knee joint effusion is identified. Tricompartmental osteoarthrosis is similar to the prior study, including severe medial compartment joint space narrowing. Soft tissue swelling about the knee has decreased. IMPRESSION: 1. Decreased soft tissue swelling and no sizable residual visible joint effusion. 2. No acute osseous abnormality. 3. Severe medial compartment osteoarthrosis. Electronically Signed   By: Logan Bores M.D.   On: 04/13/2022 11:40   DG Hip Unilat With Pelvis 2-3 Views Right  Result Date: 04/13/2022 CLINICAL DATA:   Right leg pain. EXAM: DG HIP (WITH OR WITHOUT PELVIS) 2-3V RIGHT COMPARISON:  None Available. FINDINGS: There is an acute, displaced subtrochanteric fracture of the right femur. The femoral head remains approximated with the acetabulum on the AP radiographs. Cross-table lateral radiographs are limited by superimposed tissues and underpenetration. Sequelae of previous left femur ORIF are partially visualized. IMPRESSION: Subtrochanteric fracture of the right femur. Electronically Signed   By: Logan Bores M.D.   On: 04/13/2022 11:35        Scheduled Meds:  acetaminophen  1,000 mg Oral TID   amLODipine  10 mg Oral Daily   atorvastatin  40 mg Oral QPM   docusate sodium  100 mg Oral BID   doxazosin  2 mg Oral QPM   enoxaparin (LOVENOX) injection  40 mg Subcutaneous Q24H   feeding supplement  237 mL Oral Q24H   ferrous sulfate  325 mg Oral BID   multivitamins with iron  1 tablet Oral Daily   sodium chloride flush  3 mL Intravenous Q12H   Continuous Infusions:   LOS: 1 day    Time spent: 40 minutes    Irine Seal, MD [facility]   To contact the attending provider between 7A-7P or the covering provider during after hours 7P-7A, please log into the web site [email or web address] and access using universal [place] password for that web site. If you do not have the password, please call the hospital operator.  04/14/2022, 4:25 PM

## 2022-04-14 NOTE — Consult Note (Signed)
Orthopedic Surgery H&P Note  Assessment: Patient is a 82 y.o. male with right pertrochanteric femur fracture   Plan: -Given the non-traumatic mechanism and the lucency seen on the CT, I am starting work up for pathological fracture -CT chest/abdomen/pelvis has been ordered -Considering MRI of the hip to work up further -DVT ppx: per primary -Weight bearing status: NWB RLE -Will need surgery eventually but need to determine whether this is pathological or not first. I explained to him what surgery may look like but did not get too specific because I told him we are going to work this up further before determining the final treatment plan    ___________________________________________________________________________   Chief complaint: right hip pain  History:  Patient is a 82 y.o. male who I had previously seen for right knee pain and swelling. Work up 2 weeks ago including aspiration was consistent with osteoarthritis. I provided him with an intraarticular injection. He got relief of his knee pain which continues to be better. He was able to ambulate with walker after the injection. Within the last 2 days, he has noticed acute onset of pain in his right hip and inability to ambulate. There was no trauma or injury. He has no history of cancer. Has not noticed any unintentional weight loss. No pain in any other extremity. Denies paresthesias and numbness.   Review of systems: General: denies fevers and chills, myalgias Neurologic: denies recent changes in vision, slurred speech Abdomen: denies nausea, vomiting, hematemesis Respiratory: denies cough, shortness of breath   Past medical history:  HTN CKD HLD Anemia of CKD Pre-diabetes  Osteoarthritis   Allergies: NKDA    Past surgical history:  Left hip intramedullary nail  Bilateral knee arthroscopy Polypectomy   Social history: Denies use of nicotine-containing products (cigarettes, vaping, smokeless, etc.) Alcohol use:  denies Denies use of recreational drugs   Physical Exam:  General: no acute distress, appears stated age Neurologic: alert, answering questions appropriately, following commands Cardiovascular: regular rate, no cyanosis Respiratory: unlabored breathing on room air, symmetric chest rise Psychiatric: appropriate affect, normal cadence to speech  MSK:   -Bilateral upper extremities  No tenderness to palpation over extremity, no gross deformity Fires deltoid, biceps, triceps, wrist extensors, wrist flexors, finger extensors, finger flexors  AIN/PIN/IO intact  Palpable radial pulse  Sensation intact to light touch in median/ulnar/radial/axillary nerve distributions  Hand warm and well perfused  -Right lower extremity  No tenderness to palpation over extremity, except at the right hip, pain with log roll, leg short when compared to contralateral side  Swelling in the knee has improved since the last time I saw him Fires quadriceps, hamstrings, tibialis anterior, gastrocnemius and soleus, extensor hallucis longus Does not fire hip flexors due to pain Plantarflexes and dorsiflexes toes Sensation intact to light touch in sural, saphenous, tibial, deep peroneal, and superficial peroneal nerve distributions Foot warm and well perfused  -Left lower extremity No tenderness to palpation over extremity, no gross deformity Fires hip flexors, quadriceps, hamstrings, tibialis anterior, gastrocnemius and soleus, extensor hallucis longus Plantarflexes and dorsiflexes toes Sensation intact to light touch in sural, saphenous, tibial, deep peroneal, and superficial peroneal nerve distributions Foot warm and well perfused  Imaging: XR of the right femur from 04/13/2022 was independently reviewed and interpreted, showing a displaced, right pertrochanteric femur fracture. No other fractures seen. No dislocation seen.   CT of the right hip form 04/13/2022 shows displaced pertrochanteric femur fracture.  There is lucency within the femoral neck and in the region of  the trochanter. There are calcifications within the soft tissues around the fracture in the soft tissues. Cortical defect seen in the right inferior ramus too.    Patient name: Frank Hernandez Patient MRN: [id number] Date: 04/14/22

## 2022-04-14 NOTE — TOC Initial Note (Signed)
Transition of Care [facility]) - Initial/Assessment Note    Patient Details  Name: Frank Hernandez MRN: [id number] Date of Birth: 04-20-1940  Transition of [facility]) CM/SW Contact:    Lennart Pall, LCSW Phone Number: 04/14/2022, 1:52 PM  Clinical Narrative:                  Met with pt and spouse today to review TOC/ CSW role and review possible dc needs.  Pt confirms that he was admitted here from [facility] where he had dc'd 2/5.  Now with femur fx and awaiting medical work up and plan.  Pt and wife uncertain at this time if dc plan will be for a return to [facility] - agreeable for CSW to follow along and assist with dc planning when ready.  Expected Discharge Plan: Skilled Nursing Facility Barriers to Discharge: Continued Medical Work up   Patient Goals and CMS Choice Patient states their goals for this hospitalization and ongoing recovery are:: goal still to return home when possible          Expected Discharge Plan and Services       Living arrangements for the past 2 months: Single Family Home                                      Prior Living Arrangements/Services Living arrangements for the past 2 months: Single Family Home Lives with:: Spouse Patient language and need for interpreter reviewed:: Yes Do you feel safe going back to the place where you live?: Yes      Need for Family Participation in Patient Care: Yes (Comment) Care giver support system in place?: Yes (comment)      Activities of Daily Living      Permission Sought/Granted Permission sought to share information with : Family Supports Permission granted to share information with : Yes, Verbal Permission Granted  Share Information with NAME: Frank Hernandez     Permission granted to share info w Relationship: spouse  Permission granted to share info w Contact Information: [phone]  Emotional Assessment Appearance:: Appears stated age Attitude/Demeanor/Rapport: Engaged,  Gracious Affect (typically observed): Accepting Orientation: : Oriented to Self, Oriented to Place, Oriented to  Time, Oriented to Situation Alcohol / Substance Use: Not Applicable Psych Involvement: No (comment)  Admission diagnosis:  Closed nondisplaced subtrochanteric fracture of right femur ([place]) [S72.24XA] Patient Active Problem List   Diagnosis Date Noted   Closed nondisplaced subtrochanteric fracture of right femur ([place]) 04/13/2022   Normocytic anemia 04/13/2022   Chronic kidney disease, stage 3a ([place]) 04/03/2022   Decreased oral intake 04/02/2022   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   Essential hypertension 04/01/2022   Pain in joint, lower leg 04/01/2022   Effusion of right knee 03/30/2022   Pressure injury of skin 03/30/2022   Effusion, right knee 03/30/2022   Concussion with unknown loss of consciousness status 12/24/2020   Hypokalemia    Rib fractures 06/10/2020   Severe essential hypertension 11/15/2018   Syncope 12/26/2015   Pre-diabetes 12/26/2015   PCP:  [name] (Inactive) Pharmacy:   [facility] [place] ([place] [place] - [address] [place] ([place]) [place] [address] Phone: [phone] Fax: [phone]     Social Determinants of Health (SDOH) Social History: SDOH Screenings   Food Insecurity: No Food Insecurity (04/05/2022)  Housing: Low Risk  (04/05/2022)  Transportation Needs: No Transportation Needs (  04/05/2022)  Utilities: Not At Risk (04/05/2022)  Tobacco Use: Low Risk  (04/14/2022)   SDOH Interventions:     Readmission Risk Interventions    04/14/2022    1:48 PM 04/02/2022   11:43 AM  Readmission Risk Prevention Plan  Post Dischage Appt  Complete  Medication Screening  Complete  Transportation Screening Complete Complete  PCP or Specialist Appt within 5-7 Days Complete   Home Care Screening Complete   Medication Review (RN CM) Complete

## 2022-04-15 DIAGNOSIS — C419 Malignant neoplasm of bone and articular cartilage, unspecified: Secondary | ICD-10-CM

## 2022-04-15 DIAGNOSIS — M84551A Pathological fracture in neoplastic disease, right femur, initial encounter for fracture: Secondary | ICD-10-CM

## 2022-04-15 DIAGNOSIS — I10 Essential (primary) hypertension: Secondary | ICD-10-CM | POA: Diagnosis not present

## 2022-04-15 DIAGNOSIS — M84551P Pathological fracture in neoplastic disease, right femur, subsequent encounter for fracture with malunion: Secondary | ICD-10-CM

## 2022-04-15 DIAGNOSIS — S7224XP Nondisplaced subtrochanteric fracture of right femur, subsequent encounter for closed fracture with malunion: Secondary | ICD-10-CM | POA: Diagnosis not present

## 2022-04-15 DIAGNOSIS — N1831 Chronic kidney disease, stage 3a: Secondary | ICD-10-CM | POA: Diagnosis not present

## 2022-04-15 DIAGNOSIS — D649 Anemia, unspecified: Secondary | ICD-10-CM | POA: Diagnosis not present

## 2022-04-15 LAB — CBC
HCT: 26 % — ABNORMAL LOW (ref 39.0–52.0)
Hemoglobin: 8.4 g/dL — ABNORMAL LOW (ref 13.0–17.0)
MCH: 30.7 pg (ref 26.0–34.0)
MCHC: 32.3 g/dL (ref 30.0–36.0)
MCV: 94.9 fL (ref 80.0–100.0)
Platelets: 215 10*3/uL (ref 150–400)
RBC: 2.74 MIL/uL — ABNORMAL LOW (ref 4.22–5.81)
RDW: 13.6 % (ref 11.5–15.5)
WBC: 6.9 10*3/uL (ref 4.0–10.5)
nRBC: 0 % (ref 0.0–0.2)

## 2022-04-15 LAB — BASIC METABOLIC PANEL
Anion gap: 7 (ref 5–15)
BUN: 36 mg/dL — ABNORMAL HIGH (ref 8–23)
CO2: 22 mmol/L (ref 22–32)
Calcium: 8.7 mg/dL — ABNORMAL LOW (ref 8.9–10.3)
Chloride: 103 mmol/L (ref 98–111)
Creatinine, Ser: 1.56 mg/dL — ABNORMAL HIGH (ref 0.61–1.24)
GFR, Estimated: 45 mL/min — ABNORMAL LOW (ref >60)
Glucose, Bld: 81 mg/dL (ref 70–99)
Potassium: 4.6 mmol/L (ref 3.5–5.1)
Sodium: 132 mmol/L — ABNORMAL LOW (ref 135–145)

## 2022-04-15 LAB — KAPPA/LAMBDA LIGHT CHAINS
Kappa free light chain: 120 mg/L — ABNORMAL HIGH (ref 3.3–19.4)
Kappa, lambda light chain ratio: 8.33 — ABNORMAL HIGH (ref 0.26–1.65)
Lambda free light chains: 14.4 mg/L (ref 5.7–26.3)

## 2022-04-15 LAB — BETA 2 MICROGLOBULIN, SERUM: Beta-2 Microglobulin: 3.1 mg/L — ABNORMAL HIGH (ref 0.6–2.4)

## 2022-04-15 LAB — MISC LABCORP TEST (SEND OUT): Labcorp test code: 81950

## 2022-04-15 LAB — MAGNESIUM: Magnesium: 2.2 mg/dL (ref 1.7–2.4)

## 2022-04-15 MED ORDER — SENNOSIDES-DOCUSATE SODIUM 8.6-50 MG PO TABS
1.0000 | ORAL_TABLET | Freq: Two times a day (BID) | ORAL | Status: DC
  Administered 2022-04-15 – 2022-04-20 (×7): 1 via ORAL
  Filled 2022-04-15 (×10): qty 1

## 2022-04-15 NOTE — Progress Notes (Signed)
Orthopedic Surgery Progress Note   Assessment: Patient is a 82 y.o. male with  right pertrochanteric femur fracture    Plan: -Will need operative management pending further work up  -Okay for diet today, NPO at midnight -DVT ppx: per primary -Weight bearing status: NWB RLE -PT/OT evaluate and treat -Pain control -Dispo: pending completion of operative plans  ___________________________________________________________________________  Subjective: No acute events overnight. Having pain in right hip. Reiterates that he only started having pain a couple of days ago. Was walking short distances prior to a couple of days ago. Denies paresthesias and numbness.    Physical Exam:  General: no acute distress, appears stated age Neurologic: alert, answering questions appropriately, following commands Respiratory: unlabored breathing on room air, symmetric chest rise Psychiatric: appropriate affect, normal cadence to speech  MSK:   -Right lower extremity  TTP over the hip otherwise nontender to palpation over the remainder of the extremity, leg short when compared to contralateral side Fires quadriceps, hamstrings, tibialis anterior, gastrocnemius and soleus, extensor hallucis longus Does not fire hip flexors due to pain Plantarflexes and dorsiflexes toes Sensation intact to light touch in sural, saphenous, tibial, deep peroneal, and superficial peroneal nerve distributions Foot warm and well perfused   Patient name: Frank Hernandez Patient MRN: [id number] Date: 04/15/22

## 2022-04-15 NOTE — Progress Notes (Signed)
PROGRESS NOTE    Frank Hernandez  [id number] DOB: 1941/02/19 DOA: 04/13/2022 PCP: [name] (Inactive)    Chief Complaint  Patient presents with   Right Femur Fracture     Brief Narrative:  Patient is a pleasant 82 year old gentleman, ambulates with the help of a walker recent hospitalization, history of hypertension hyperlipidemia CKD stage IIIa, prediabetes, status post left intramedullary intertrochanteric nailing for hip fracture 06/28/2020, recent hospitalization for nontraumatic right knee pain and effusion 03/29/2022-04/05/2022 whereby he underwent aspiration and steroid injection felt secondary to osteoarthritis sent to SNF presenting back with right hip pain.  Plain films of the right hip and pelvis showed subtrochanteric fracture of the right femur, plain films of the right knee with decreased soft tissue swelling no sizable residual visible joint effusion, no acute osseous abnormality, severe medial compartment osteoarthrosis.  CT right hip done with concerns for pathologic fracture, lytic lesions noted.  Patient seen in consultation by orthopedics who have ordered a CT chest abdomen and pelvis, SPEP. -Hematology/oncology consultation pending.   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 -Plain films of the right femur done with subtrochanteric fracture of the right femur. -Plain films of the right hip and pelvis with subtrochanteric fracture of the right femur. -Orthopedics consulted by EDP, patient seen  in consultation by Dr. Laurance Flatten who recommended CT right hip with plans for possible surgery today 04/14/2022. -CT right hip done with severely displaced fracture involving the intratrochanteric region of proximal right femur, lucency noted in the right femoral neck and intertrochanteric region suggesting the possibility of lytic lesion and pathologic fracture.  There appears to be cortical defect involving the right inferior pubic ramus suggesting possible metastatic lesion in that area.  MRI may be performed for further evaluation. 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  No obvious primary neoplastic process identified.  Large right inguinal hernia containing small bowel loops and part of the bladder.  No acute pulmonary findings of pulmonary nodules.  No acute abdominal pelvic findings, mass lesions or adenopathy.  SPEP/UPEP pending.  -NWB RLE -Per orthopedics no plans for surgery today pending workup. 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 -Myeloma panel pending.  PSA ordered and noted to be elevated. -Per oncology will need tissue sampling which may be obtained potentially during fixation of his right femoral fracture or if not possible may need a CT-guided biopsy of other bone lesions in the bone marrow biopsy. -Oncology following and appreciate input and recommendations. -Per orthopedics.  2.  Hypertension -Norvasc, Cardura.   3.  CKD stage IIIa -Stable. -Patient and wife deny any prior history of chronic kidney disease. -Follow-up.  4.  Hyperlipidemia -Continue statin.  5.  Anemia  of chronic disease -  Patient with no overt bleeding. -Hemoglobin currently 8.4. -Anemia panel done 04/02/2022 with iron of 37, TIBC 251, ferritin 165, folate 8.6. -Follow H&H. -Transfusion threshold hemoglobin < 7.  6.  Prediabetes -Hemoglobin A1c 6.0. -Outpatient follow-up.   DVT prophylaxis: Lovenox Code Status: Full Family Communication: Updated patient and wife at bedside. Disposition: Likely SNF once workup is completed, postoperatively, when cleared by orthopedics and hematology/oncology.  Status is: Inpatient Remains inpatient appropriate because: Severity of illness   Consultants:  Orthopedics: Dr. Laurance Flatten 04/14/2022 Oncology: Dr. Irene Limbo 04/14/2022  Procedures:  CT right hip 04/13/2022 Plain films of the right femur 04/13/2022 Plain films of the right knee 04/13/2022 CT chest abdomen and pelvis 04/14/2022  Antimicrobials:  Anti-infectives (From admission, onward)    None         Subjective: Laying in bed.  Alert and oriented.  No chest pain.  No shortness of breath.  No abdominal pain.  Still with complaints of right hip pain however states pain medication helps with it.    Objective: Vitals:   04/14/22 0542 04/14/22 1310 04/14/22 2033 04/15/22 0651  BP: 123/66 132/70 139/60 (!) 155/69  Pulse: 70 79 77 79  Resp: 16 18 18 18  $ Temp: 98.3 F (36.8 C) 98.8 F (37.1 C) 98.1 F (36.7 C) 98.9 F (37.2 C)  TempSrc: Oral Oral Oral Oral  SpO2: 99% 98% 98% 98%    Intake/Output Summary (Last 24 hours) at 04/15/2022 1232 Last data filed at 04/15/2022 0800 Gross per 24 hour  Intake 1040 ml  Output 800 ml  Net 240 ml    There were no vitals filed for this visit.  Examination:  General exam: NAD. Respiratory system: CTAB anterior lung fields..  No wheezes, no crackles, no rhonchi.fair air movement.  Speaking in full sentences.   Cardiovascular system: Regular rate rhythm no murmurs rubs or gallops.  No JVD.  No lower extremity edema.  Gastrointestinal system: Abdomen  is soft, nontender, nondistended, positive bowel sounds.  No rebound.  No guarding.   Central nervous system: Alert and oriented. No focal neurological deficits. Extremities: Right hip tender to palpation, shortened and externally rotated.  Skin: No rashes, lesions or ulcers Psychiatry: Judgement and insight appear normal. Mood & affect appropriate.     Data Reviewed: I have personally reviewed following labs and imaging studies  CBC: Recent Labs  Lab 04/13/22 1101 04/14/22 0439 04/15/22 0420  WBC 8.3 8.1 6.9  NEUTROABS 6.9  --   --   HGB 9.1* 8.0* 8.4*  HCT 27.9* 24.3* 26.0*  MCV 93.3 93.8 94.9  PLT 254 213 215     Basic Metabolic Panel: Recent Labs  Lab 04/13/22 1101 04/14/22 0439 04/15/22 0420  NA 135 136 132*  K 4.3 4.9 4.6  CL 104 107 103  CO2 22 22 22  $ GLUCOSE 136* 94 81  BUN 41* 37* 36*  CREATININE 1.59* 1.51* 1.56*  CALCIUM 9.0 9.0 8.7*  MG  --   --  2.2     GFR: Estimated Creatinine Clearance: 44.9 mL/min (A) (by C-G formula based on SCr of 1.56 mg/dL (H)).  Liver Function Tests: No results for input(s): "AST", "ALT", "ALKPHOS", "BILITOT", "PROT", "ALBUMIN" in the last 168 hours.  CBG: No results for input(s): "GLUCAP" in the last 168 hours.   Recent Results (from the past 240 hour(s))  Surgical pcr screen     Status: None   Collection Time: 04/13/22  8:59 PM   Specimen: Nasal Mucosa; Nasal Swab  Result Value  Ref Range Status   MRSA, PCR NEGATIVE NEGATIVE Final   Staphylococcus aureus NEGATIVE NEGATIVE Final    Comment: (NOTE) The Xpert SA Assay (FDA approved for NASAL specimens in patients 73 years of age and older), is one component of a comprehensive surveillance program. It is not intended to diagnose infection nor to guide or monitor treatment. Performed at [facility], [place] [address], [place], [place] [address]          Radiology Studies: CT CHEST ABDOMEN PELVIS WO CONTRAST  Result Date:  04/14/2022 CLINICAL DATA:  Suspected pathologic right hip fracture. EXAM: CT CHEST, ABDOMEN AND PELVIS WITHOUT CONTRAST TECHNIQUE: Multidetector CT imaging of the chest, abdomen and pelvis was performed following the standard protocol without IV contrast. RADIATION DOSE REDUCTION: This exam was performed according to the departmental dose-optimization program which includes automated exposure control, adjustment of the mA and/or kV according to patient size and/or use of iterative reconstruction technique. COMPARISON:  None Available. FINDINGS: CT CHEST FINDINGS Cardiovascular: The heart is normal in size. No pericardial effusion. There is tortuosity, ectasia and calcification of the thoracic aorta. Three-vessel coronary artery calcifications are noted. Mediastinum/Nodes: No mediastinal or hilar mass or lymphadenopathy. The esophagus is grossly normal. Thyroid gland is unremarkable. Lungs/Pleura: No pulmonary mass or pulmonary nodules. No acute pulmonary process. No pleural effusions or pleural nodules. The central tracheobronchial tree is unremarkable. Musculoskeletal: Underline ostial crow CIS. This makes detection of lytic lesions somewhat difficult. I do not see any obvious sternal or rib lesions. Could not exclude lytic lesions involving the T9 and T10 vertebral bodies. Asymmetric right-sided gynecomastia noted without obvious breast mass. CT ABDOMEN PELVIS FINDINGS Hepatobiliary: Small hepatic cysts are unchanged since prior CT scan. No worrisome hepatic lesions without contrast. No intrahepatic biliary dilatation. The gallbladder is unremarkable. No common bile duct dilatation. Pancreas: Age related pancreatic atrophy but no mass or acute inflammation. Spleen: Normal size.  No focal lesions. Adrenals/Urinary Tract: No adrenal gland lesions. No worrisome renal lesions without contrast. Stable simple bilateral renal cysts not requiring any further evaluation or follow-up. The bladder is grossly normal. Part of  the bladder is in a right inguinal hernia. This is a stable finding. Stomach/Bowel: The stomach, duodenum, small and colon are grossly normal. No acute inflammatory process, mass lesions or obstructive findings. Diverticulum noted at the gastric cardia. Descending and sigmoid colon diverticulosis noted along with moderate stool in the rectum. Vascular/Lymphatic: Scattered atherosclerotic calcification involving the aorta and iliac arteries but no aneurysm. No mesenteric or retroperitoneal mass or adenopathy. Reproductive: The prostate gland and seminal vesicles are grossly. Other: There is a large right inguinal hernia containing small bowel loops and part of the bladder. Musculoskeletal: Subacute displaced right hip fracture again noted with areas of attempted callus formation and heterotopic ossification. There is a cortical defect involving the right inferior pubic ramus and also the left acetabulum consistent with destructive lytic bone disease. There is also a lytic lesion involving the posterior aspect of the L2 vertebral body with cortical destruction and a small amount of tumor in the canal. IMPRESSION: 1. Subacute displaced right hip fracture with areas of attempted callus formation and heterotopic ossification. 2. 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 Could not exclude lytic lesions involving the T9 and T10 vertebral bodies. Other lytic lesions could be difficult to identify because of underlying osteoporosis. Myeloma could be a consideration. PET-CT may be helpful for further evaluation  and to help identify a suitable biopsy site. 3. No obvious primary neoplastic process is identified without contrast. 4. Large right inguinal hernia containing small bowel loops and part of the bladder. 5. No acute pulmonary findings or pulmonary nodules. 6. No acute abdominal/pelvic findings, mass lesions or adenopathy. 7.  Aortic atherosclerosis. 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 Electronically Signed   By: Marijo Sanes M.D.   On: 04/14/2022 13:07   CT Hip Right Wo Contrast  Result Date: 04/13/2022 CLINICAL DATA:  Right femur fracture. EXAM: CT OF THE RIGHT HIP WITHOUT CONTRAST TECHNIQUE: Multidetector CT imaging of the right hip was performed according to the standard protocol. Multiplanar CT image reconstructions were also generated. RADIATION DOSE REDUCTION: This exam was performed according to the departmental dose-optimization program which includes automated exposure control, adjustment of the mA and/or kV according to patient size and/or use of iterative reconstruction technique. COMPARISON:  Radiographs of same day. FINDINGS: Severely displaced fracture is seen involving the intertrochanteric region of the proximal right femur. There is seen lucency involving the right femoral neck which extends into trochanteric region and possible lytic destruction or malignancy cannot be excluded. There is a cortical defect involving the right inferior pubic ramus concerning for possible lytic lesion. Large right inguinal hernia is noted which appears to contain small bowel and a portion of the urinary bladder. IMPRESSION: Severely displaced fracture is seen involving the intertrochanteric region of the proximal right femur. There does appear to be lucency in the right femoral neck and intertrochanteric region suggesting the possibility of lytic lesion and pathologic fracture. There does appear to be cortical defect involving the right inferior pubic ramus suggesting possible metastatic lesion in this area. MRI may be performed for further evaluation. Also noted is large right inguinal hernia which appears to contain small bowel and a portion of the urinary bladder. Electronically Signed   By: Marijo Conception M.D.   On: 04/13/2022 15:50        Scheduled Meds:  acetaminophen  1,000 mg Oral TID   amLODipine  10 mg Oral Daily    atorvastatin  40 mg Oral QPM   doxazosin  2 mg Oral QPM   enoxaparin (LOVENOX) injection  40 mg Subcutaneous Q24H   feeding supplement  237 mL Oral Q24H   ferrous sulfate  325 mg Oral BID   multivitamins with iron  1 tablet Oral Daily   senna-docusate  1 tablet Oral BID   sodium chloride flush  3 mL Intravenous Q12H   Continuous Infusions:   LOS: 2 days    Time spent: 40 minutes    Frank Seal, MD [facility]   To contact the attending provider between 7A-7P or the covering provider during after hours 7P-7A, please log into the web site [email or web address] and access using universal [place] password for that web site. If you do not have the password, please call the hospital operator.  04/15/2022, 12:32 PM
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
## 2022-04-16 DIAGNOSIS — S7224XP Nondisplaced subtrochanteric fracture of right femur, subsequent encounter for closed fracture with malunion: Secondary | ICD-10-CM | POA: Diagnosis not present

## 2022-04-16 DIAGNOSIS — I10 Essential (primary) hypertension: Secondary | ICD-10-CM | POA: Diagnosis not present

## 2022-04-16 DIAGNOSIS — D649 Anemia, unspecified: Secondary | ICD-10-CM | POA: Diagnosis not present

## 2022-04-16 DIAGNOSIS — N1831 Chronic kidney disease, stage 3a: Secondary | ICD-10-CM | POA: Diagnosis not present

## 2022-04-16 LAB — MISC LABCORP TEST (SEND OUT): Labcorp test code: 81950

## 2022-04-16 LAB — CBC WITH DIFFERENTIAL/PLATELET
Abs Immature Granulocytes: 0.02 10*3/uL (ref 0.00–0.07)
Basophils Absolute: 0 10*3/uL (ref 0.0–0.1)
Basophils Relative: 0 %
Eosinophils Absolute: 0.3 10*3/uL (ref 0.0–0.5)
Eosinophils Relative: 5 %
HCT: 26 % — ABNORMAL LOW (ref 39.0–52.0)
Hemoglobin: 8.5 g/dL — ABNORMAL LOW (ref 13.0–17.0)
Immature Granulocytes: 0 %
Lymphocytes Relative: 17 %
Lymphs Abs: 1.1 10*3/uL (ref 0.7–4.0)
MCH: 30.8 pg (ref 26.0–34.0)
MCHC: 32.7 g/dL (ref 30.0–36.0)
MCV: 94.2 fL (ref 80.0–100.0)
Monocytes Absolute: 0.6 10*3/uL (ref 0.1–1.0)
Monocytes Relative: 9 %
Neutro Abs: 4.4 10*3/uL (ref 1.7–7.7)
Neutrophils Relative %: 69 %
Platelets: 209 10*3/uL (ref 150–400)
RBC: 2.76 MIL/uL — ABNORMAL LOW (ref 4.22–5.81)
RDW: 13.7 % (ref 11.5–15.5)
WBC: 6.4 10*3/uL (ref 4.0–10.5)
nRBC: 0 % (ref 0.0–0.2)

## 2022-04-16 LAB — PROTEIN ELECTROPHORESIS, SERUM
A/G Ratio: 0.9 (ref 0.7–1.7)
Albumin ELP: 3 g/dL (ref 2.9–4.4)
Alpha-1-Globulin: 0.3 g/dL (ref 0.0–0.4)
Alpha-2-Globulin: 0.9 g/dL (ref 0.4–1.0)
Beta Globulin: 0.6 g/dL — ABNORMAL LOW (ref 0.7–1.3)
Gamma Globulin: 1.6 g/dL (ref 0.4–1.8)
Globulin, Total: 3.4 g/dL (ref 2.2–3.9)
M-Spike, %: 1.2 g/dL — ABNORMAL HIGH
Total Protein ELP: 6.4 g/dL (ref 6.0–8.5)

## 2022-04-16 LAB — RENAL FUNCTION PANEL
Albumin: 2.9 g/dL — ABNORMAL LOW (ref 3.5–5.0)
Anion gap: 6 (ref 5–15)
BUN: 29 mg/dL — ABNORMAL HIGH (ref 8–23)
CO2: 24 mmol/L (ref 22–32)
Calcium: 8.7 mg/dL — ABNORMAL LOW (ref 8.9–10.3)
Chloride: 105 mmol/L (ref 98–111)
Creatinine, Ser: 1.45 mg/dL — ABNORMAL HIGH (ref 0.61–1.24)
GFR, Estimated: 49 mL/min — ABNORMAL LOW (ref >60)
Glucose, Bld: 81 mg/dL (ref 70–99)
Phosphorus: 3.2 mg/dL (ref 2.5–4.6)
Potassium: 4.6 mmol/L (ref 3.5–5.1)
Sodium: 135 mmol/L (ref 135–145)

## 2022-04-16 LAB — PREPARE RBC (CROSSMATCH)

## 2022-04-16 MED ORDER — SODIUM CHLORIDE 0.9% IV SOLUTION: Freq: Once | INTRAVENOUS | Status: AC

## 2022-04-16 MED ORDER — ACETAMINOPHEN 325 MG PO TABS: 650.0000 mg | ORAL_TABLET | Freq: Once | ORAL | Status: DC

## 2022-04-16 MED ORDER — DIPHENHYDRAMINE HCL 25 MG PO CAPS
25.0000 mg | ORAL_CAPSULE | Freq: Once | ORAL | Status: AC
  Administered 2022-04-16: 25 mg via ORAL
  Filled 2022-04-16: qty 1

## 2022-04-16 NOTE — Progress Notes (Signed)
Orthopedic Surgery Progress Note   Assessment: Patient is a 82 y.o. male with right pertrochanteric femur fracture    Plan: -Will need operative management pending further work up  -NPO for now -DVT ppx: per primary -Weight bearing status: NWB RLE -PT/OT evaluate and treat -Pain control -Dispo: pending completion of operative plans  ___________________________________________________________________________  Subjective: No acute events overnight. Hip pain well controlled. No pain elsewhere. Denies paresthesias and numbness.    Physical Exam:  General: no acute distress, appears stated age Neurologic: alert, answering questions appropriately, following commands Respiratory: unlabored breathing on room air, symmetric chest rise Psychiatric: appropriate affect, normal cadence to speech  MSK:   -Right lower extremity  TTP over the hip otherwise nontender to palpation over the remainder of the extremity, leg short when compared to contralateral side Fires quadriceps, hamstrings, tibialis anterior, gastrocnemius and soleus, extensor hallucis longus Does not fire hip flexors due to pain Plantarflexes and dorsiflexes toes Sensation intact to light touch in sural, saphenous, tibial, deep peroneal, and superficial peroneal nerve distributions Foot warm and well perfused   Patient name: Frank Hernandez Patient MRN: [id number] Date: 04/16/22

## 2022-04-16 NOTE — Progress Notes (Signed)
Preoperative Scores  VAS leg: 8 SF-36:              -Physical functioning: 0              -Role limitations due to physical health: 0              -Role limitations due to emotional problems: 0              -Energy/fatigue: 80              -Emotional well-being: 67              -Social functioning: 25              -Pain: 12.5              -General health: 28  Callie Fielding, MD Orthopedic Surgeon
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
## 2022-04-17 ENCOUNTER — Inpatient Hospital Stay (HOSPITAL_COMMUNITY): Payer: Medicare PPO | Admitting: Certified Registered Nurse Anesthetist

## 2022-04-17 ENCOUNTER — Inpatient Hospital Stay (HOSPITAL_COMMUNITY): Payer: Medicare PPO

## 2022-04-17 ENCOUNTER — Other Ambulatory Visit: Payer: Self-pay

## 2022-04-17 ENCOUNTER — Encounter (HOSPITAL_COMMUNITY)
Admission: EM | Disposition: A | Discharge status: Skilled Nursing Facility | Payer: Self-pay | Source: Skilled Nursing Facility | Attending: Internal Medicine

## 2022-04-17 DIAGNOSIS — D649 Anemia, unspecified: Secondary | ICD-10-CM | POA: Diagnosis not present

## 2022-04-17 DIAGNOSIS — S7221XA Displaced subtrochanteric fracture of right femur, initial encounter for closed fracture: Secondary | ICD-10-CM

## 2022-04-17 DIAGNOSIS — N1831 Chronic kidney disease, stage 3a: Secondary | ICD-10-CM | POA: Diagnosis not present

## 2022-04-17 DIAGNOSIS — C419 Malignant neoplasm of bone and articular cartilage, unspecified: Secondary | ICD-10-CM | POA: Diagnosis not present

## 2022-04-17 DIAGNOSIS — I10 Essential (primary) hypertension: Secondary | ICD-10-CM | POA: Diagnosis not present

## 2022-04-17 DIAGNOSIS — S7224XP Nondisplaced subtrochanteric fracture of right femur, subsequent encounter for closed fracture with malunion: Secondary | ICD-10-CM | POA: Diagnosis not present

## 2022-04-17 DIAGNOSIS — R7303 Prediabetes: Secondary | ICD-10-CM

## 2022-04-17 HISTORY — PX: INTRAMEDULLARY (IM) NAIL INTERTROCHANTERIC: SHX5875

## 2022-04-17 LAB — COMPREHENSIVE METABOLIC PANEL WITH GFR
ALT: 24 U/L (ref 0–44)
AST: 30 U/L (ref 15–41)
Albumin: 2.9 g/dL — ABNORMAL LOW (ref 3.5–5.0)
Alkaline Phosphatase: 142 U/L — ABNORMAL HIGH (ref 38–126)
Anion gap: 7 (ref 5–15)
BUN: 31 mg/dL — ABNORMAL HIGH (ref 8–23)
CO2: 23 mmol/L (ref 22–32)
Calcium: 8.8 mg/dL — ABNORMAL LOW (ref 8.9–10.3)
Chloride: 105 mmol/L (ref 98–111)
Creatinine, Ser: 1.49 mg/dL — ABNORMAL HIGH (ref 0.61–1.24)
GFR, Estimated: 47 mL/min — ABNORMAL LOW
Glucose, Bld: 90 mg/dL (ref 70–99)
Potassium: 4.7 mmol/L (ref 3.5–5.1)
Sodium: 135 mmol/L (ref 135–145)
Total Bilirubin: 1 mg/dL (ref 0.3–1.2)
Total Protein: 6.8 g/dL (ref 6.5–8.1)

## 2022-04-17 LAB — CBC WITH DIFFERENTIAL/PLATELET
Abs Immature Granulocytes: 0.02 10*3/uL (ref 0.00–0.07)
Basophils Absolute: 0 10*3/uL (ref 0.0–0.1)
Basophils Relative: 0 %
Eosinophils Absolute: 0.4 10*3/uL (ref 0.0–0.5)
Eosinophils Relative: 5 %
HCT: 30.2 % — ABNORMAL LOW (ref 39.0–52.0)
Hemoglobin: 9.9 g/dL — ABNORMAL LOW (ref 13.0–17.0)
Immature Granulocytes: 0 %
Lymphocytes Relative: 16 %
Lymphs Abs: 1.2 10*3/uL (ref 0.7–4.0)
MCH: 30 pg (ref 26.0–34.0)
MCHC: 32.8 g/dL (ref 30.0–36.0)
MCV: 91.5 fL (ref 80.0–100.0)
Monocytes Absolute: 0.8 10*3/uL (ref 0.1–1.0)
Monocytes Relative: 10 %
Neutro Abs: 5.1 10*3/uL (ref 1.7–7.7)
Neutrophils Relative %: 69 %
Platelets: 208 10*3/uL (ref 150–400)
RBC: 3.3 MIL/uL — ABNORMAL LOW (ref 4.22–5.81)
RDW: 14.5 % (ref 11.5–15.5)
WBC: 7.5 10*3/uL (ref 4.0–10.5)
nRBC: 0 % (ref 0.0–0.2)

## 2022-04-17 LAB — SURGICAL PCR SCREEN
MRSA, PCR: NEGATIVE
Staphylococcus aureus: NEGATIVE

## 2022-04-17 SURGERY — FIXATION, FRACTURE, INTERTROCHANTERIC, WITH INTRAMEDULLARY ROD
Anesthesia: General | Site: Hip | Laterality: Right

## 2022-04-17 MED ORDER — ISOPROPYL ALCOHOL 70 % SOLN
Status: DC | PRN
  Administered 2022-04-17: 1 via TOPICAL

## 2022-04-17 MED ORDER — PHENYLEPHRINE 80 MCG/ML (10ML) SYRINGE FOR IV PUSH (FOR BLOOD PRESSURE SUPPORT)
PREFILLED_SYRINGE | INTRAVENOUS | Status: AC
  Filled 2022-04-17: qty 10

## 2022-04-17 MED ORDER — ONDANSETRON HCL 4 MG/2ML IJ SOLN
INTRAMUSCULAR | Status: AC
  Filled 2022-04-17: qty 2

## 2022-04-17 MED ORDER — LACTATED RINGERS IV SOLN: INTRAVENOUS | Status: DC | PRN

## 2022-04-17 MED ORDER — LIDOCAINE HCL (PF) 2 % IJ SOLN
INTRAMUSCULAR | Status: AC
  Filled 2022-04-17: qty 5

## 2022-04-17 MED ORDER — FENTANYL CITRATE (PF) 100 MCG/2ML IJ SOLN
INTRAMUSCULAR | Status: AC
  Filled 2022-04-17: qty 2

## 2022-04-17 MED ORDER — OXYCODONE HCL 5 MG PO TABS: 5.0000 mg | ORAL_TABLET | Freq: Once | ORAL | Status: DC | PRN

## 2022-04-17 MED ORDER — HYDROMORPHONE HCL 1 MG/ML IJ SOLN: 0.2500 mg | INTRAMUSCULAR | Status: DC | PRN

## 2022-04-17 MED ORDER — DEXAMETHASONE SODIUM PHOSPHATE 10 MG/ML IJ SOLN
INTRAMUSCULAR | Status: AC
  Filled 2022-04-17: qty 1

## 2022-04-17 MED ORDER — ONDANSETRON HCL 4 MG/2ML IJ SOLN
INTRAMUSCULAR | Status: DC | PRN
  Administered 2022-04-17: 4 mg via INTRAVENOUS

## 2022-04-17 MED ORDER — PHENYLEPHRINE 80 MCG/ML (10ML) SYRINGE FOR IV PUSH (FOR BLOOD PRESSURE SUPPORT)
PREFILLED_SYRINGE | INTRAVENOUS | Status: DC | PRN
  Administered 2022-04-17 (×3): 80 ug via INTRAVENOUS
  Administered 2022-04-17: 160 ug via INTRAVENOUS

## 2022-04-17 MED ORDER — CEFAZOLIN SODIUM-DEXTROSE 2-3 GM-%(50ML) IV SOLR
INTRAVENOUS | Status: DC | PRN
  Administered 2022-04-17: 2 g via INTRAVENOUS

## 2022-04-17 MED ORDER — METHOCARBAMOL 500 MG IVPB - SIMPLE MED
500.0000 mg | Freq: Once | INTRAVENOUS | Status: AC
  Administered 2022-04-17: 500 mg via INTRAVENOUS
  Filled 2022-04-17: qty 500

## 2022-04-17 MED ORDER — PROMETHAZINE HCL 25 MG/ML IJ SOLN: 6.2500 mg | INTRAMUSCULAR | Status: DC | PRN

## 2022-04-17 MED ORDER — HYDROMORPHONE HCL 1 MG/ML IJ SOLN
INTRAMUSCULAR | Status: AC
  Filled 2022-04-17: qty 2

## 2022-04-17 MED ORDER — PROPOFOL 10 MG/ML IV BOLUS
INTRAVENOUS | Status: AC
  Filled 2022-04-17: qty 20

## 2022-04-17 MED ORDER — PROPOFOL 500 MG/50ML IV EMUL
INTRAVENOUS | Status: DC | PRN
  Administered 2022-04-17: 200 mg via INTRAVENOUS

## 2022-04-17 MED ORDER — 0.9 % SODIUM CHLORIDE (POUR BTL) OPTIME
TOPICAL | Status: DC | PRN
  Administered 2022-04-17: 1000 mL

## 2022-04-17 MED ORDER — FENTANYL CITRATE (PF) 100 MCG/2ML IJ SOLN
INTRAMUSCULAR | Status: DC | PRN
  Administered 2022-04-17 (×2): 25 ug via INTRAVENOUS
  Administered 2022-04-17: 50 ug via INTRAVENOUS
  Administered 2022-04-17: 25 ug via INTRAVENOUS
  Administered 2022-04-17 (×2): 50 ug via INTRAVENOUS
  Administered 2022-04-17: 25 ug via INTRAVENOUS
  Administered 2022-04-17: 50 ug via INTRAVENOUS

## 2022-04-17 MED ORDER — DEXAMETHASONE SODIUM PHOSPHATE 10 MG/ML IJ SOLN
INTRAMUSCULAR | Status: DC | PRN
  Administered 2022-04-17: 10 mg via INTRAVENOUS

## 2022-04-17 MED ORDER — CEFAZOLIN SODIUM 1 G IJ SOLR
INTRAMUSCULAR | Status: AC
  Filled 2022-04-17: qty 20

## 2022-04-17 MED ORDER — DEXMEDETOMIDINE HCL IN NACL 80 MCG/20ML IV SOLN
INTRAVENOUS | Status: DC | PRN
  Administered 2022-04-17: 8 ug via BUCCAL

## 2022-04-17 MED ORDER — CEFAZOLIN SODIUM-DEXTROSE 2-4 GM/100ML-% IV SOLN
2.0000 g | Freq: Three times a day (TID) | INTRAVENOUS | Status: AC
  Administered 2022-04-17 – 2022-04-18 (×2): 2 g via INTRAVENOUS
  Filled 2022-04-17 (×2): qty 100

## 2022-04-17 MED ORDER — OXYCODONE HCL 5 MG/5ML PO SOLN: 5.0000 mg | Freq: Once | ORAL | Status: DC | PRN

## 2022-04-17 MED ORDER — LIDOCAINE 2% (20 MG/ML) 5 ML SYRINGE
INTRAMUSCULAR | Status: DC | PRN
  Administered 2022-04-17: 60 mg via INTRAVENOUS

## 2022-04-17 MED ORDER — AMISULPRIDE (ANTIEMETIC) 5 MG/2ML IV SOLN: 10.0000 mg | Freq: Once | INTRAVENOUS | Status: DC | PRN

## 2022-04-17 SURGICAL SUPPLY — 41 items
BIT DRILL INTERTAN LAG SCREW (BIT) IMPLANT
BIT DRILL SHORT 4.0 (BIT) IMPLANT
BNDG COHESIVE 6X5 TAN ST LF (GAUZE/BANDAGES/DRESSINGS) ×1 IMPLANT
CANISTER SUCT 3000ML PPV (MISCELLANEOUS) ×1 IMPLANT
COVER PERINEAL POST (MISCELLANEOUS) ×1 IMPLANT
COVER SURGICAL LIGHT HANDLE (MISCELLANEOUS) ×1 IMPLANT
DRAPE C-ARM 42X120 X-RAY (DRAPES) ×1 IMPLANT
DRAPE C-ARMOR (DRAPES) IMPLANT
DRAPE STERI IOBAN 125X83 (DRAPES) ×1 IMPLANT
DRILL BIT SHORT 4.0 (BIT) ×2
DRSG TEGADERM 4X4.75 (GAUZE/BANDAGES/DRESSINGS) IMPLANT
DURAPREP 26ML APPLICATOR (WOUND CARE) ×1 IMPLANT
ELECT REM PT RETURN 15FT ADLT (MISCELLANEOUS) ×1 IMPLANT
GAUZE PAD ABD 8X10 STRL (GAUZE/BANDAGES/DRESSINGS) ×1 IMPLANT
GAUZE SPONGE 4X4 12PLY STRL (GAUZE/BANDAGES/DRESSINGS) ×1 IMPLANT
GAUZE XEROFORM 1X8 LF (GAUZE/BANDAGES/DRESSINGS) ×1 IMPLANT
GAUZE XEROFORM 5X9 LF (GAUZE/BANDAGES/DRESSINGS) IMPLANT
GLOVE BIO SURGEON STRL SZ7.5 (GLOVE) ×1 IMPLANT
GLOVE BIOGEL PI IND STRL 7.5 (GLOVE) ×1 IMPLANT
GOWN STRL REUS W/ TWL XL LVL3 (GOWN DISPOSABLE) ×1 IMPLANT
GOWN STRL REUS W/TWL XL LVL3 (GOWN DISPOSABLE) ×1
GUIDE PIN 3.2X343 (PIN) ×2
GUIDE PIN 3.2X343MM (PIN) ×2
GUIDE ROD 3.0 (MISCELLANEOUS) ×1
KIT BASIN OR (CUSTOM PROCEDURE TRAY) ×1 IMPLANT
KIT TURNOVER KIT B (KITS) ×1 IMPLANT
MANIFOLD NEPTUNE II (INSTRUMENTS) ×1 IMPLANT
NAIL TRIGEN RT 11.5X38-125 (Nail) IMPLANT
NS IRRIG 1000ML POUR BTL (IV SOLUTION) ×1 IMPLANT
PACK GENERAL/GYN (CUSTOM PROCEDURE TRAY) ×1 IMPLANT
PAD ARMBOARD 7.5X6 YLW CONV (MISCELLANEOUS) ×1 IMPLANT
PIN GUIDE 3.2X343MM (PIN) IMPLANT
ROD GUIDE 3.0 (MISCELLANEOUS) IMPLANT
SCREW LAG COMPR KIT 105/100 (Screw) IMPLANT
SCREW TRIGEN LOW PROF 5.0X40 (Screw) IMPLANT
SCREW TRIGEN LOW PROF 5.0X42.5 (Screw) IMPLANT
SUT VIC AB 0 CT1 18XCR BRD 8 (SUTURE) ×1 IMPLANT
SUT VIC AB 0 CT1 36 (SUTURE) IMPLANT
SUT VIC AB 0 CT1 8-18 (SUTURE) ×2
SUT VIC AB 2-0 CT1 18 (SUTURE) ×1 IMPLANT
WATER STERILE IRR 1000ML POUR (IV SOLUTION) ×1 IMPLANT

## 2022-04-17 NOTE — Transfer of Care (Signed)
Immediate Anesthesia Transfer of Care Note  Patient: Frank Hernandez  Procedure(s) Performed: Procedure(s): INTRAMEDULLARY (IM) NAIL INTERTROCHANTERIC (Right)  Patient Location: PACU  Anesthesia Type:General  Level of Consciousness: Alert, Awake, Oriented  Airway & Oxygen Therapy: Patient Spontanous Breathing  Post-op Assessment: Report given to RN  Post vital signs: Reviewed and stable  Last Vitals:  Vitals:   04/17/22 0650 04/17/22 1122  BP: 135/72   Pulse: 79 77  Resp: 12   Temp:  36.9 C  SpO2: 0000000     Complications: No apparent anesthesia complications

## 2022-04-17 NOTE — Anesthesia Procedure Notes (Signed)
Procedure Name: LMA Insertion Date/Time: 04/17/2022 7:46 AM  Performed by: Gerald Leitz, CRNAPre-anesthesia Checklist: Patient identified, Patient being monitored, Timeout performed, Emergency Drugs available and Suction available Patient Re-evaluated:Patient Re-evaluated prior to induction Oxygen Delivery Method: Circle system utilized Preoxygenation: Pre-oxygenation with 100% oxygen Induction Type: IV induction Ventilation: Mask ventilation without difficulty LMA: LMA inserted and LMA with gastric port inserted LMA Size: 5.0 Tube type: Oral Number of attempts: 1 Placement Confirmation: positive ETCO2 and breath sounds checked- equal and bilateral Tube secured with: Tape Dental Injury: Teeth and Oropharynx as per pre-operative assessment
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
## 2022-04-17 NOTE — H&P (Signed)
Orthopedic Surgery H&P Update  Patient's history and physical reviewed - no updates at this time Risks of surgery were covered again, patient elected to continue with planned procedure Written consent verified Site marked NPO for procedure Ancef on call to OR To OR when ready  Discussed recommendation for operative intervention in the form of right cephalomedullary rod with hip biopsy for his right subtrochanteric pathological femur fracture. Explained the risks of this procedure included, but were not limited to: nonunion, malunion, hardware failure, infection, bleeding, stiffness, screw cut out, need for additional procedures, deep vein thrombosis, need for transfusion, pulmonary embolism, and death. The benefits of this procedure would be to promote fracture healing in the right alignment and to allow for mobilization. The alternatives of this surgery would be to treat the fracture with immobilization with bed rest or traction. The patient's questions were answered to his and his wife's satisfaction. After this discussion, patient elected to proceed with surgery. Informed consent was obtained.    Ileene Rubens, MD Orthopedic Surgeon

## 2022-04-17 NOTE — Progress Notes (Addendum)
Orthopedic Surgery Progress Note   Assessment: Patient is a 82 y.o. male with right pertrochanteric femur fracture    Plan: -Planning for operative stabilization and biopsy today -NPO for procedure -DVT ppx: per primary -Weight bearing status: NWB RLE -PT/OT evaluate and treat post-op -Pain control -Dispo: pending completion of operative plans  ___________________________________________________________________________  Subjective: No acute events overnight. Hip pain well controlled with current medications. No pain elsewhere. Denies paresthesias and numbness. Had a conversation with him and his wife this morning. Understands surgical plan and his questions were answered this morning.     Yesterday afternoon I discussed the patient's case with his consulted oncologist, Dr. Irene Limbo. 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 There are bony lesions in several locations with no lung metastasis so very low concern for sarcoma.    Physical Exam:  General: no acute distress, appears stated age Neurologic: alert, answering questions appropriately, following commands Respiratory: unlabored breathing on room air, symmetric chest rise Psychiatric: appropriate affect, normal cadence to speech  MSK:   -Right lower extremity  TTP over the hip otherwise nontender to palpation over the remainder of the extremity, leg short when compared to contralateral side Fires quadriceps, hamstrings, tibialis anterior, gastrocnemius and soleus, extensor hallucis longus Does not fire hip flexors due to pain Plantarflexes and dorsiflexes toes Sensation intact to light touch in sural, saphenous, tibial, deep peroneal, and superficial peroneal nerve distributions Foot warm and well perfused   Patient name: Frank Hernandez Patient MRN: [id number] Date: 04/17/22

## 2022-04-17 NOTE — Brief Op Note (Signed)
04/17/2022  11:21 AM  PATIENT:  Frank Hernandez  82 y.o. male  PRE-OPERATIVE DIAGNOSIS:  right subtrochanteric femur fracture  POST-OPERATIVE DIAGNOSIS:  right subtrochanteric femur fracture  PROCEDURE:  Procedure(s): INTRAMEDULLARY (IM) NAIL INTERTROCHANTERIC (Right), right hip biopsy  SURGEON:  Surgeon(s) and Role:    Callie Fielding, MD - Primary  PHYSICIAN ASSISTANT:   ASSISTANTS: none   ANESTHESIA:   general  EBL:  100 mL   BLOOD ADMINISTERED:none  DRAINS: none   LOCAL MEDICATIONS USED:  NONE  SPECIMEN:  4 femur specimens, 1 reamings specimen  DISPOSITION OF SPECIMEN:  PATHOLOGY  COUNTS:  YES  TOURNIQUET: None  DICTATION: .Note written in EPIC  PLAN OF CARE: Admit to inpatient   PATIENT DISPOSITION:  PACU - hemodynamically stable.   Delay start of Pharmacological VTE agent (>24hrs) due to surgical blood loss or risk of bleeding: no

## 2022-04-17 NOTE — Progress Notes (Signed)
Orthopedic Surgery Progress Note   Assessment: Patient is a 82 y.o. male with right pertrochanteric femur fracture s/p biopsy and CMN   Plan: -Operative plans: complete -Follow up intra-op pathology -Regular diet -DVT ppx: per primary -Weight bearing status: WBAT RLE -PT/OT evaluate and treat -Pain control -Dispo: per primary  ___________________________________________________________________________  Subjective: No acute events since surgery. Recovering in PACU. Having pain around his hip. No pain elsewhere. Denies paresthesias and numbness.    Physical Exam:  General: no acute distress, appears stated age Neurologic: alert, answering questions appropriately, following commands Respiratory: unlabored breathing on room air, symmetric chest rise Psychiatric: appropriate affect, normal cadence to speech  MSK:   -Right lower extremity  Length, alignment, and rotation similar to contralateral side Fires quadriceps, hamstrings, tibialis anterior, gastrocnemius and soleus, extensor hallucis longus Does not fire hip flexors due to pain Plantarflexes and dorsiflexes toes Sensation intact to light touch in sural, saphenous, tibial, deep peroneal, and superficial peroneal nerve distributions Foot warm and well perfused   Patient name: Frank Hernandez Patient MRN: [id number] Date: 04/17/22

## 2022-04-17 NOTE — Plan of Care (Signed)
  Problem: Education: Goal: Knowledge of General Education information will improve Description Including pain rating scale, medication(s)/side effects and non-pharmacologic comfort measures Outcome: Progressing   

## 2022-04-17 NOTE — Anesthesia Postprocedure Evaluation (Signed)
Anesthesia Post Note  Patient: Frank Hernandez  Procedure(s) Performed: INTRAMEDULLARY (IM) NAIL INTERTROCHANTERIC (Right: Hip)     Patient location during evaluation: PACU Anesthesia Type: General Level of consciousness: awake and alert Pain management: pain level controlled Vital Signs Assessment: post-procedure vital signs reviewed and stable Respiratory status: spontaneous breathing, nonlabored ventilation and respiratory function stable Cardiovascular status: blood pressure returned to baseline and stable Postop Assessment: no apparent nausea or vomiting Anesthetic complications: no   No notable events documented.  Last Vitals:  Vitals:   04/17/22 1200 04/17/22 1215  BP: 114/64 109/68  Pulse: 81 83  Resp: 16 16  Temp:    SpO2: 100% 100%    Last Pain:  Vitals:   04/17/22 1215  TempSrc:   PainSc: 0-No pain                 Lynda Rainwater

## 2022-04-17 NOTE — Progress Notes (Signed)
PROGRESS NOTE    Frank Hernandez  [id number] DOB: September 14, 1940 DOA: 04/13/2022 PCP: [name] (Inactive)    Chief Complaint  Patient presents with   Right Femur Fracture     Brief Narrative:  Patient is a pleasant 82 year old gentleman, ambulates with the help of a walker recent hospitalization, history of hypertension hyperlipidemia CKD stage IIIa, prediabetes, status post left intramedullary intertrochanteric nailing for hip fracture 06/28/2020, recent hospitalization for nontraumatic right knee pain and effusion 03/29/2022-04/05/2022 whereby he underwent aspiration and steroid injection felt secondary to osteoarthritis sent to SNF presenting back with right hip pain.  Plain films of the right hip and pelvis showed subtrochanteric fracture of the right femur, plain films of the right knee with decreased soft tissue swelling no sizable residual visible joint effusion, no acute osseous abnormality, severe medial compartment osteoarthrosis.  CT right hip done with concerns for pathologic fracture, lytic lesions noted.  Patient seen in consultation by orthopedics who have ordered a CT chest abdomen and pelvis, SPEP. -Hematology/oncology consultation pending.   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 -Patient presented from SNF with complaints of right hip pain, no recent fall or trauma noted noted to be spontaneous. -Plain films of the right femur done with subtrochanteric fracture of the right femur. -Plain films of the right hip and pelvis with subtrochanteric  fracture of the right femur. -Orthopedics consulted by EDP, patient seen in consultation by Dr. Laurance Flatten who recommended CT right hip with plans for possible surgery today 04/14/2022. -CT right hip done with severely displaced fracture involving the intratrochanteric region of proximal right femur, lucency noted in the right femoral neck and intertrochanteric region suggesting the possibility of lytic lesion and pathologic fracture.  There appears to be cortical defect involving the right inferior pubic ramus suggesting possible metastatic lesion in that area.  MRI may be performed for further evaluation. 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  No obvious primary neoplastic process identified.  Large right inguinal hernia containing small bowel loops and part of the bladder.  No acute pulmonary findings of pulmonary nodules.  No acute abdominal pelvic findings, mass lesions or adenopathy.  SPEP/UPEP pending.  -Beta-2 microglobulin elevated at 3.1. -PSA elevated at 8.56. Kappa free light chain elevated at 120, kappa lambda light chain ratio at 8.33. -NWB RLE -Per orthopedics patient underwent surgical intervention as well as biopsy today 04/17/2022..   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 -Per oncology will need tissue sampling which may be obtained potentially during fixation of his right femoral fracture or if not possible may need a CT-guided biopsy of other bone lesions in the bone marrow biopsy. -Oncology following  and appreciate input and recommendations. -Per  orthopedics.  2.  Hypertension -Continue Norvasc, Cardura.   3.  CKD stage IIIa -Stable. -Patient and wife deny any prior history of chronic kidney disease. -Follow-up.  4.  Hyperlipidemia -Continue statin.  5.  Anemia of chronic disease -Patient with no overt bleeding preoperatively. -Postop surgical wound site with dressing soaked in blood. -Status post preop transfusion of 1 unit packed red blood cells 04/16/2022. -Hemoglobin currently at 9.9 this morning. -Anemia panel done 04/02/2022 with iron of 37, TIBC 251, ferritin 165, folate 8.6. -Follow H&H. -Transfusion threshold hemoglobin < 8.  6.  Prediabetes -Hemoglobin A1c 6.0. -Outpatient follow-up.   DVT prophylaxis: Lovenox Code Status: Full Family Communication: Updated patient.  Updated wife at bedside.  Disposition: Likely SNF once workup is completed, postoperatively, when cleared by orthopedics and hematology/oncology.  Status is: Inpatient Remains inpatient appropriate because: Severity of illness   Consultants:  Orthopedics: Dr. Laurance Flatten 04/14/2022 Oncology: Dr. Irene Limbo 04/14/2022  Procedures:  CT right hip 04/13/2022 Plain films of the right femur 04/13/2022 Plain films of the right knee 04/13/2022 CT chest abdomen and pelvis 04/14/2022 Right pathologic subtrochanteric femur fracture status post biopsy right proximal femur by Dr. Laurance Flatten 04/17/2022. Transfused 1 unit packed red blood cells 04/16/2022  Antimicrobials:  Anti-infectives (From admission, onward)    Start     Dose/Rate Route Frequency Ordered Stop   04/17/22 1600  ceFAZolin (ANCEF) IVPB 2g/100 mL premix        2 g 200 mL/hr over 30 Minutes Intravenous Every 8 hours 04/17/22 1304 04/18/22 0759         Subjective: Patient laying in bed.  Patient just returned from PACU postoperatively.  No chest pain.  No shortness of breath.  Still with some pain in the right hip.  Wife at bedside.  Objective: Vitals:    04/17/22 1200 04/17/22 1215 04/17/22 1312 04/17/22 1318  BP: 114/64 109/68 112/68   Pulse: 81 83 88   Resp: 16 16 14   $ Temp:   97.7 F (36.5 C)   TempSrc:   Oral   SpO2: 100% 100% 100%   Weight:    94 kg  Height:    5' 9.5" (1.765 m)    Intake/Output Summary (Last 24 hours) at 04/17/2022 1713 Last data filed at 04/17/2022 1200 Gross per 24 hour  Intake 1883 ml  Output 802 ml  Net 1081 ml    Filed Weights   04/17/22 1318  Weight: 94 kg    Examination:  General exam: NAD. Respiratory system: CTAB anterior lung fields.  No wheezes, no crackles, no rhonchi.  Fair air movement.  Speaking in full sentences. Cardiovascular system: Regular rate rhythm no murmurs rubs or gallops.  No JVD.  No lower extremity edema.  Gastrointestinal system: Abdomen is soft, nontender, nondistended, positive bowel sounds.  No rebound.  No guarding. Central nervous system: Alert and oriented. No focal neurological deficits. Extremities: Right hip with postop dressing in place saturated in blood.  Some tenderness to palpation right hip.  No edema noted.  Skin: No rashes, lesions or ulcers Psychiatry: Judgement and insight appear normal. Mood & affect appropriate.     Data Reviewed: I have personally reviewed following labs and imaging studies  CBC: Recent Labs  Lab 04/13/22 1101 04/14/22 0439 04/15/22 0420 04/16/22 0502 04/17/22 0423  WBC 8.3 8.1 6.9 6.4 7.5  NEUTROABS 6.9  --   --  4.4 5.1  HGB 9.1* 8.0* 8.4* 8.5* 9.9*  HCT 27.9* 24.3* 26.0* 26.0* 30.2*  MCV 93.3 93.8  94.9 94.2 91.5  PLT 254 213 215 209 208     Basic Metabolic Panel: Recent Labs  Lab 04/13/22 1101 04/14/22 0439 04/15/22 0420 04/16/22 0502 04/17/22 0423  NA 135 136 132* 135 135  K 4.3 4.9 4.6 4.6 4.7  CL 104 107 103 105 105  CO2 22 22 22 24 23  $ GLUCOSE 136* 94 81 81 90  BUN 41* 37* 36* 29* 31*  CREATININE 1.59* 1.51* 1.56* 1.45* 1.49*  CALCIUM 9.0 9.0 8.7* 8.7* 8.8*  MG  --   --  2.2  --   --   PHOS  --    --   --  3.2  --      GFR: Estimated Creatinine Clearance: 45.1 mL/min (A) (by C-G formula based on SCr of 1.49 mg/dL (H)).  Liver Function Tests: Recent Labs  Lab 04/16/22 0502 04/17/22 0423  AST  --  30  ALT  --  24  ALKPHOS  --  142*  BILITOT  --  1.0  PROT  --  6.8  ALBUMIN 2.9* 2.9*     CBG: No results for input(s): "GLUCAP" in the last 168 hours.   Recent Results (from the past 240 hour(s))  Surgical pcr screen     Status: None   Collection Time: 04/13/22  8:59 PM   Specimen: Nasal Mucosa; Nasal Swab  Result Value Ref Range Status   MRSA, PCR NEGATIVE NEGATIVE Final   Staphylococcus aureus NEGATIVE NEGATIVE Final    Comment: (NOTE) The Xpert SA Assay (FDA approved for NASAL specimens in patients 24 years of age and older), is one component of a comprehensive surveillance program. It is not intended to diagnose infection nor to guide or monitor treatment. Performed at [facility], [place] [address], [place], [place] [address]   Surgical pcr screen     Status: None   Collection Time: 04/17/22  6:41 AM   Specimen: Nasal Mucosa; Nasal Swab  Result Value Ref Range Status   MRSA, PCR NEGATIVE NEGATIVE Final   Staphylococcus aureus NEGATIVE NEGATIVE Final    Comment: (NOTE) The Xpert SA Assay (FDA approved for NASAL specimens in patients 49 years of age and older), is one component of a comprehensive surveillance program. It is not intended to diagnose infection nor to guide or monitor treatment. Performed at [facility], [place] [address], [place], [place] [address]          Radiology Studies: DG C-Arm 1-60 Min-No Report  Result Date: 04/17/2022 Fluoroscopy was utilized by the requesting physician.  No radiographic interpretation.   DG C-Arm 1-60 Min-No Report  Result Date: 04/17/2022 Fluoroscopy was utilized by the requesting physician.  No radiographic interpretation.   DG C-Arm 1-60 Min-No Report  Result  Date: 04/17/2022 Fluoroscopy was utilized by the requesting physician.  No radiographic interpretation.        Scheduled Meds:  acetaminophen  1,000 mg Oral TID   amLODipine  10 mg Oral Daily   atorvastatin  40 mg Oral QPM   doxazosin  2 mg Oral QPM   enoxaparin (LOVENOX) injection  40 mg Subcutaneous Q24H   feeding supplement  237 mL Oral Q24H   ferrous sulfate  325 mg Oral BID   multivitamins with iron  1 tablet Oral Daily   senna-docusate  1 tablet Oral BID   sodium chloride flush  3 mL Intravenous Q12H   Continuous Infusions:   ceFAZolin (ANCEF) IV       LOS:  4 days    Time spent: 40 minutes    Irine Seal, MD [facility]   To contact the attending provider between 7A-7P or the covering provider during after hours 7P-7A, please log into the web site [email or web address] and access using universal [facility] password for that web site. If you do not have the password, please call the hospital operator.  04/17/2022, 5:13 PM

## 2022-04-17 NOTE — Op Note (Addendum)
Orthopedic Surgery Operative Report   Procedure: Right pathological subtrochanteric femur fracture Biopsy right proximal femur   Modifier: none   Date of procedure: 04/17/2022   Patient name: Frank Hernandez   MRN: [id number]  DOB: 1940/12/13   Surgeon: Ileene Rubens, MD Assistant: None Pre-operative diagnosis: right pathological subtrochanteric femur fracture Post-operative diagnosis: same as above Findings: soft, yellow/white tissue within the proximal femoral canal and trochanter   Specimens: 4 from right proximal femur, 1 reamings specimen Anesthesia: general EBL: [id number] Complications: none Pre-incision antibiotic: ancef   Implants:  Implant Name Type Inv. Item Serial No. Manufacturer Lot No. LRB No. Used Action  NAIL TRIGEN RT [id number] - [id number] Nail NAIL TRIGEN RT 11.5X38-125  [facility] [employer] [id number] Right 1 Implanted  SCREW LAG COMPR KIT 105/100 - [id number] Screw SCREW LAG COMPR KIT 105/100  [employer] [id number] Right 1 Implanted  SCREW TRIGEN LOW PROF 5.0X40 - [id number] Screw SCREW TRIGEN LOW PROF 5.0X40  [employer] [id number] Right 1 Implanted  SCREW TRIGEN LOW PROF 5.0X42.5 - [id number] Screw SCREW TRIGEN LOW PROF 5.0X42.5  [employer] [id number] Right 1 Implanted       Indication for procedure: Patient is a 82 y.o. male who presented to the ER with acute onset of right hip pain. He had no preceding pain in the hip and there was no trauma before onset of hip pain. Work up revealed a subtrochanteric femur fracture. CT chest/abdomen/pelvis showed another lytic lesion in the pubic ramus, one in the left acetabulum, and one in L2 vertebral body. Blood work showed an elevated kappa free light chain and a elevated kappa/lamda ratio. 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 I talked about the procedure with the patient which would be a biopsy and stabilization with a cephalomedullary rod. Explained the risks of this procedure included, but were not limited to: nonunion, malunion, hardware failure, infection, bleeding, stiffness, screw cut out, need for additional procedures, MI, deep vein thrombosis, pulmonary embolism, and death. In his case, I told him that certain risks especially wound dehiscence, infection, and nonunion may be higher especially as he gets treated for the underlying tumor. The benefits of this procedure would be to stabilize the fracture to allow for early weight bearing and to get a tissue diagnosis. The alternatives of this surgery would be to treat the fracture with traction or to do no intervention. The patient's questions were answered to his satisfaction. After this discussion, patient elected to proceed with surgery. Afterwards, informed written consent was obtained.    Procedure Description: The patient was met in the pre-operative holding area. The patient's identity and consent were verified. The operative site was marked by myself. The patient's remaining questions about the surgery were answered. The patient was brought back to the operating room. General anesthesia was induced and an endotracheal tube was placed by the anesthesia staff. The patient was transferred to the [facility] table. All bony prominences were well padded. Traction was applied and an attempt at closed reduction with the Hana table was made. Fluoroscopy confirmed a satisfactory reduction with translation of the shaft medial to the proximal fragment. This was correctable with manual pressure. The surgical area was cleansed with alcohol. Ancef and TXA  were administered by anesthesia. The patient's skin was then prepped and draped in a standard, sterile  fashion. A time out was performed that identified the patient, the procedure, and the operative site. All team members agreed with what was stated in the time out.    Fluoroscopy was used to identify the level of the fracture and lesion seen on the CT. A longitudinal incision was made over the lateral femur over the lucent lesion. Incision was carried down sharply through the fascia. A cobb was used to elevate the muscle off of the femur. There was a cortical defect over the lateral aspect of the femur. A pituitary was inserted into the defect under fluoroscopic guidance into the suspected lesion and the fracture site. Several samples were collected from this area and placed into specimen cups. A curette was used to free further soft tissue within the canal which was collected as specimen. A total of four specimen cups were sent from this area. The material removed from the femur was yellow/white and soft. It was easy to remove this tissue. It did not seem consistent with bone.    Next, an incision was made just proximal and inferior to the right greater trochanter. The incision was taken sharply down through the fascia. A guide pin was inserted into the wound onto the top of the greater trochanter. Fluoroscopy was used to place the guide pin at the starting point at the tip of the greater trochanter and in line with the middle of the femoral neck. The wire was then advanced to a point just past the lesser trochanter. A soft tissue sleeve was advanced over the wire onto the greater trochanter. An entry reamer was used to open the proximal femoral canal under fluoroscopic guidance. The pin and reamer were removed. A bone hook was placed around the femur and was used to translate the shaft laterally and obtain the reduction. A long guide wire was placed down the femoral canal. It was advanced to the superior aspect of the patella under fluoroscopy. The length of the nail was determine based off the contralateral  side which was a 38. A 38 nail was selected. A 41m reamer was inserted over the guidewire and used to ream the femoral canal. It was advanced down past the isthmus under fluoroscopic guidance. The canal was serially reamed with increasing sized reamers until a 12.546mreamer at which point there was chatter. An 11.27m17mail was selected. The nail was advanced over the guidewire. It was advanced until the lag screws were estimated to end in the center of the femoral head. The guide wire was removed.    A incision was made just distal to the biopsy incision in line with the femur and where the jig was approximating for the lag screws. It was made sharply through the skin, dermis, and fascia over the lateral thigh in the area where the lag screws would be inserted. The lag screw inserter was placed through the jig onto the lateral femoral cortex. A guide wire was advanced through the lag screw inserted into the femoral head under fluoroscopic guidance. It was found to be in acceptable position on the AP and lateral views. The length of the lag screw was estimated off of the guide wire. A 1027m46mrew was selected. The inferior lag screw was drilled through the guide. The derotation device was placed through the inserter. The proximal lag screw hole was then drilled over the guide wire. The screw was inserted over the  wire under fluoroscopic guidance. The derotation bar was removed and the inferior lag screw was inserted. The set cap was tightened over the lag screws through the jig.    The C arm was then brought to the knee in a lateral position to obtain perfect circles at the distal interlocking holes. An incision was made over the distal interlocking holes on the lateral aspect of the femur. This incision was taken down through fascia. A drill was inserted into the wound and placed over the interlocking hole using perfect circle technique. The hole was then drilled bicortically. A depth gauge was used to estimate  the screw length. A 55m screw was selected for the more proximal hole. This was inserted and there was good purchase. The same process was then repeated to insert a 42.522mscrew in the more distal hole.    Final AP and lateral fluoroscopic images were then taken of the hip, femur, and knee showing satisfactory reduction and placement of the cephalomedullary rod. The wounds were copiously irrigated with sterile saline. The fascia was closed with 0 vicryl. The deep dermal layer was closed with 2-0 vicryl. The skin was closed with staples. Dressings were applied. All counts were correct at the end of the case. Patient was transferred back to a hospital bed. He was awakened from anesthesia and brought back to the post-anesthesia care unit in stable condition.     Post-operative plan: The patient will recover in the post-anesthesia care unit and then go to the floor on the medicine service. The patient will receive two post-operative doses of ancef. The patient will be weight bearing as tolerated. The patient will work with physical therapy. Oncology has been consulted and the biopsy results will be followed. Radiation/chemotherapy should be held until her incisions have healed. The patient's disposition will be determined by the medicine service.       MiIleene RubensMD Orthopedic Surgeon

## 2022-04-18 DIAGNOSIS — S7224XP Nondisplaced subtrochanteric fracture of right femur, subsequent encounter for closed fracture with malunion: Secondary | ICD-10-CM | POA: Diagnosis not present

## 2022-04-18 DIAGNOSIS — N1831 Chronic kidney disease, stage 3a: Secondary | ICD-10-CM | POA: Diagnosis not present

## 2022-04-18 DIAGNOSIS — I10 Essential (primary) hypertension: Secondary | ICD-10-CM | POA: Diagnosis not present

## 2022-04-18 DIAGNOSIS — D649 Anemia, unspecified: Secondary | ICD-10-CM | POA: Diagnosis not present

## 2022-04-18 LAB — BASIC METABOLIC PANEL
Anion gap: 9 (ref 5–15)
BUN: 37 mg/dL — ABNORMAL HIGH (ref 8–23)
CO2: 19 mmol/L — ABNORMAL LOW (ref 22–32)
Calcium: 8.2 mg/dL — ABNORMAL LOW (ref 8.9–10.3)
Chloride: 104 mmol/L (ref 98–111)
Creatinine, Ser: 1.97 mg/dL — ABNORMAL HIGH (ref 0.61–1.24)
GFR, Estimated: 34 mL/min — ABNORMAL LOW (ref >60)
Glucose, Bld: 148 mg/dL — ABNORMAL HIGH (ref 70–99)
Potassium: 5.1 mmol/L (ref 3.5–5.1)
Sodium: 132 mmol/L — ABNORMAL LOW (ref 135–145)

## 2022-04-18 LAB — CBC WITH DIFFERENTIAL/PLATELET
Abs Immature Granulocytes: 0.06 10*3/uL (ref 0.00–0.07)
Basophils Absolute: 0 10*3/uL (ref 0.0–0.1)
Basophils Relative: 0 %
Eosinophils Absolute: 0 10*3/uL (ref 0.0–0.5)
Eosinophils Relative: 0 %
HCT: 25.1 % — ABNORMAL LOW (ref 39.0–52.0)
Hemoglobin: 8 g/dL — ABNORMAL LOW (ref 13.0–17.0)
Immature Granulocytes: 1 %
Lymphocytes Relative: 7 %
Lymphs Abs: 0.6 10*3/uL — ABNORMAL LOW (ref 0.7–4.0)
MCH: 30.2 pg (ref 26.0–34.0)
MCHC: 31.9 g/dL (ref 30.0–36.0)
MCV: 94.7 fL (ref 80.0–100.0)
Monocytes Absolute: 0.9 10*3/uL (ref 0.1–1.0)
Monocytes Relative: 10 %
Neutro Abs: 7.6 10*3/uL (ref 1.7–7.7)
Neutrophils Relative %: 82 %
Platelets: 161 10*3/uL (ref 150–400)
RBC: 2.65 MIL/uL — ABNORMAL LOW (ref 4.22–5.81)
RDW: 15 % (ref 11.5–15.5)
WBC: 9.2 10*3/uL (ref 4.0–10.5)
nRBC: 0 % (ref 0.0–0.2)

## 2022-04-18 LAB — HEMOGLOBIN AND HEMATOCRIT, BLOOD
HCT: 22.3 % — ABNORMAL LOW (ref 39.0–52.0)
Hemoglobin: 7.2 g/dL — ABNORMAL LOW (ref 13.0–17.0)

## 2022-04-18 LAB — PREPARE RBC (CROSSMATCH)

## 2022-04-18 MED ORDER — SODIUM CHLORIDE 0.9 % IV SOLN: INTRAVENOUS | Status: DC

## 2022-04-18 MED ORDER — DIPHENHYDRAMINE HCL 25 MG PO CAPS
25.0000 mg | ORAL_CAPSULE | Freq: Once | ORAL | Status: AC
  Administered 2022-04-18: 25 mg via ORAL
  Filled 2022-04-18: qty 1

## 2022-04-18 MED ORDER — FUROSEMIDE 10 MG/ML IJ SOLN
20.0000 mg | Freq: Once | INTRAMUSCULAR | Status: AC
  Administered 2022-04-18: 20 mg via INTRAVENOUS
  Filled 2022-04-18: qty 2

## 2022-04-18 MED ORDER — SODIUM CHLORIDE 0.9% IV SOLUTION: Freq: Once | INTRAVENOUS | Status: AC

## 2022-04-18 NOTE — Plan of Care (Signed)

## 2022-04-18 NOTE — Progress Notes (Signed)
PROGRESS NOTE    Frank Hernandez  [id number] DOB: 11-Jun-1940 DOA: 04/13/2022 PCP: [name] (Inactive)    Chief Complaint  Patient presents with   Right Femur Fracture     Brief Narrative:  Patient is a pleasant 82 year old gentleman, ambulates with the help of a walker recent hospitalization, history of hypertension hyperlipidemia CKD stage IIIa, prediabetes, status post left intramedullary intertrochanteric nailing for hip fracture 06/28/2020, recent hospitalization for nontraumatic right knee pain and effusion 03/29/2022-04/05/2022 whereby he underwent aspiration and steroid injection felt secondary to osteoarthritis sent to SNF presenting back with right hip pain.  Plain films of the right hip and pelvis showed subtrochanteric fracture of the right femur, plain films of the right knee with decreased soft tissue swelling no sizable residual visible joint effusion, no acute osseous abnormality, severe medial compartment osteoarthrosis.  CT right hip done with concerns for pathologic fracture, lytic lesions noted.  Patient seen in consultation by orthopedics who have ordered a CT chest abdomen and pelvis, SPEP. -Hematology/oncology consultation pending.   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 -Patient presented from SNF with complaints of right hip pain, no recent fall or trauma noted noted to be spontaneous. -Plain films of the right femur done with subtrochanteric fracture of the right femur. -Plain films of the right hip and pelvis with subtrochanteric  fracture of the right femur. -Orthopedics consulted by EDP, patient seen in consultation by Dr. Laurance Flatten who recommended CT right hip with plans for possible surgery today 04/14/2022. -CT right hip done with severely displaced fracture involving the intratrochanteric region of proximal right femur, lucency noted in the right femoral neck and intertrochanteric region suggesting the possibility of lytic lesion and pathologic fracture.  There appears to be cortical defect involving the right inferior pubic ramus suggesting possible metastatic lesion in that area.  MRI may be performed for further evaluation. 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  No obvious primary neoplastic process identified.  Large right inguinal hernia containing small bowel loops and part of the bladder.  No acute pulmonary findings of pulmonary nodules.  No acute abdominal pelvic findings, mass lesions or adenopathy.  SPEP/UPEP pending.  -Beta-2 microglobulin elevated at 3.1. -PSA elevated at 8.56. Kappa free light chain elevated at 120, kappa lambda light chain ratio at 8.33. -NWB RLE -Per orthopedics patient underwent surgical intervention as well as biopsy,  04/17/2022..   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 -Per oncology will need tissue sampling which was obtained during fixation of his right femoral fracture. -Oncology following and appreciate input and recommendations. -Per orthopedics.  2.  Hypertension -Continue Norvasc, Cardura.   3.   CKD stage IIIa -Stable. -Patient and wife deny  any prior history of chronic kidney disease. -Slight bump in creatinine, gentle hydration. -Follow-up.  4.  Hyperlipidemia -Statin.  5.  Anemia of chronic disease -Patient with no overt bleeding preoperatively. -Postop surgical wound site with dressing soaked in blood. -Status post preop transfusion of 1 unit packed red blood cells 04/16/2022. -Hemoglobin currently at 8.0 this morning.   -Repeat hemoglobin at 7.2 this afternoon.  -Anemia panel done 04/02/2022 with iron of 37, TIBC 251, ferritin 165, folate 8.6. -Transfuse 2 units packed red blood cells. -Follow H&H. -Transfusion threshold hemoglobin < 8.  6.  Prediabetes -Hemoglobin A1c 6.0. -Outpatient follow-up.   DVT prophylaxis: Lovenox Code Status: Full Family Communication: Updated patient.  Updated wife at bedside.  Disposition: Likely SNF once workup is completed, postoperatively, when cleared by orthopedics and hematology/oncology.  Status is: Inpatient Remains inpatient appropriate because: Severity of illness   Consultants:  Orthopedics: Dr. Laurance Flatten 04/14/2022 Oncology: Dr. Irene Limbo 04/14/2022  Procedures:  CT right hip 04/13/2022 Plain films of the right femur 04/13/2022 Plain films of the right knee 04/13/2022 CT chest abdomen and pelvis 04/14/2022 Right pathologic subtrochanteric femur fracture status post biopsy right proximal femur by Dr. Laurance Flatten 04/17/2022. Transfused 1 unit packed red blood cells 04/16/2022  Antimicrobials:  Anti-infectives (From admission, onward)    Start     Dose/Rate Route Frequency Ordered Stop   04/17/22 1600  ceFAZolin (ANCEF) IVPB 2g/100 mL premix        2 g 200 mL/hr over 30 Minutes Intravenous Every 8 hours 04/17/22 1304 04/18/22 0100         Subjective: Laying in bed.  Still with some pain in the right hip.  Dressing in place.  No CP, No SOB.   Objective: Vitals:   04/17/22 1318 04/17/22 1920 04/18/22 0537 04/18/22 0934  BP:  (!)  93/56 (!) 112/59 139/70  Pulse:  94 95 89  Resp:  16 18   Temp:  97.6 F (36.4 C) 98.2 F (36.8 C)   TempSrc:  Oral Oral   SpO2:  100% 100%   Weight: 94 kg     Height: 5' 9.5" (1.765 m)       Intake/Output Summary (Last 24 hours) at 04/18/2022 1148 Last data filed at 04/18/2022 1000 Gross per 24 hour  Intake 723.2 ml  Output 350 ml  Net 373.2 ml    Filed Weights   04/17/22 1318  Weight: 94 kg    Examination:  General exam: NAD. Respiratory system: Lungs clear to auscultation bilaterally anterior lung fields.  No wheezes, no crackles, no rhonchi.  Fair air movement.  Speaking in full sentences.   Cardiovascular system: RRR no murmurs rubs or gallops.  No JVD.  No lower extremity edema. Gastrointestinal system: Abdomen is soft, nontender, nondistended, positive bowel sounds.  No rebound.  No guarding.  Central nervous system: Alert and oriented.  Moving extremities spontaneously.  No focal neurological deficits.   Extremities: Right hip with postop dressing in place.  Some tenderness to palpation right hip.  No edema noted.  Skin: No rashes, lesions or ulcers Psychiatry: Judgement and insight appear normal. Mood & affect appropriate.     Data Reviewed: I have personally reviewed following labs and imaging studies  CBC: Recent Labs  Lab 04/13/22 1101 04/14/22 0439 04/15/22 0420 04/16/22 0502 04/17/22 0423 04/18/22 0306  WBC 8.3 8.1 6.9 6.4 7.5 9.2  NEUTROABS 6.9  --   --  4.4 5.1 7.6  HGB 9.1* 8.0* 8.4* 8.5* 9.9* 8.0*  HCT 27.9*  24.3* 26.0* 26.0* 30.2* 25.1*  MCV 93.3 93.8 94.9 94.2 91.5 94.7  PLT 254 213 215 209 208 161     Basic Metabolic Panel: Recent Labs  Lab 04/14/22 0439 04/15/22 0420 04/16/22 0502 04/17/22 0423 04/18/22 0306  NA 136 132* 135 135 132*  K 4.9 4.6 4.6 4.7 5.1  CL 107 103 105 105 104  CO2 22 22 24 23 $ 19*  GLUCOSE 94 81 81 90 148*  BUN 37* 36* 29* 31* 37*  CREATININE 1.51* 1.56* 1.45* 1.49* 1.97*  CALCIUM 9.0 8.7* 8.7* 8.8* 8.2*   MG  --  2.2  --   --   --   PHOS  --   --  3.2  --   --      GFR: Estimated Creatinine Clearance: 34.1 mL/min (A) (by C-G formula based on SCr of 1.97 mg/dL (H)).  Liver Function Tests: Recent Labs  Lab 04/16/22 0502 04/17/22 0423  AST  --  30  ALT  --  24  ALKPHOS  --  142*  BILITOT  --  1.0  PROT  --  6.8  ALBUMIN 2.9* 2.9*     CBG: No results for input(s): "GLUCAP" in the last 168 hours.   Recent Results (from the past 240 hour(s))  Surgical pcr screen     Status: None   Collection Time: 04/13/22  8:59 PM   Specimen: Nasal Mucosa; Nasal Swab  Result Value Ref Range Status   MRSA, PCR NEGATIVE NEGATIVE Final   Staphylococcus aureus NEGATIVE NEGATIVE Final    Comment: (NOTE) The Xpert SA Assay (FDA approved for NASAL specimens in patients 51 years of age and older), is one component of a comprehensive surveillance program. It is not intended to diagnose infection nor to guide or monitor treatment. Performed at [facility], [place] [address], [place], [place] [address]   Surgical pcr screen     Status: None   Collection Time: 04/17/22  6:41 AM   Specimen: Nasal Mucosa; Nasal Swab  Result Value Ref Range Status   MRSA, PCR NEGATIVE NEGATIVE Final   Staphylococcus aureus NEGATIVE NEGATIVE Final    Comment: (NOTE) The Xpert SA Assay (FDA approved for NASAL specimens in patients 21 years of age and older), is one component of a comprehensive surveillance program. It is not intended to diagnose infection nor to guide or monitor treatment. Performed at [facility], [place] [address] [place], [place] [address]          Radiology Studies: DG FEMUR, MIN 2 VIEWS RIGHT  Result Date: 04/17/2022 CLINICAL DATA:  Postop EXAM: RIGHT FEMUR 2 VIEWS COMPARISON:  04/13/2022. FINDINGS: Recent placement intramedullary rod in the femur and compression screw fixation of the right femoral neck with grossly anatomic alignment. No evidence of  hardware failure. IMPRESSION: Unremarkable post op changes ORIF right femoral neck and femur. Electronically Signed   By: Sammie Bench M.D.   On: 04/17/2022 13:59   DG HIP UNILAT WITH PELVIS 1V RIGHT  Result Date: 04/17/2022 CLINICAL DATA:  Fluoro guidance provided EXAM: DG HIP (WITH OR WITHOUT PELVIS) 1V RIGHT FINDINGS: Dose: 40.2 mGy Fluoro time 192s IMPRESSION: C-arm fluoro guidance provided. Electronically Signed   By: Sammie Bench M.D.   On: 04/17/2022 11:04   DG C-Arm 1-60 Min-No Report  Result Date: 04/17/2022 Fluoroscopy was utilized by the requesting physician.  No radiographic interpretation.   DG C-Arm 1-60 Min-No Report  Result Date: 04/17/2022 Fluoroscopy was utilized by the  requesting physician.  No radiographic interpretation.   DG C-Arm 1-60 Min-No Report  Result Date: 04/17/2022 Fluoroscopy was utilized by the requesting physician.  No radiographic interpretation.        Scheduled Meds:  acetaminophen  1,000 mg Oral TID   amLODipine  10 mg Oral Daily   atorvastatin  40 mg Oral QPM   doxazosin  2 mg Oral QPM   enoxaparin (LOVENOX) injection  40 mg Subcutaneous Q24H   feeding supplement  237 mL Oral Q24H   ferrous sulfate  325 mg Oral BID   multivitamins with iron  1 tablet Oral Daily   senna-docusate  1 tablet Oral BID   sodium chloride flush  3 mL Intravenous Q12H   Continuous Infusions:  sodium chloride 100 mL/hr at 04/18/22 0945     LOS: 5 days    Time spent: 40 minutes    Irine Seal, MD [facility]   To contact the attending provider between 7A-7P or the covering provider during after hours 7P-7A, please log into the web site [email or web address] and access using universal [place] password for that web site. If you do not have the password, please call the hospital operator.  04/18/2022, 11:48 AM

## 2022-04-18 NOTE — Progress Notes (Signed)
Orthopedic Surgery Progress Note   Assessment: Patient is a 82 y.o. male with right pertrochanteric femur fracture s/p biopsy and CMN   Plan: -Operative plans: complete -Follow up intra-op pathology -Regular diet -DVT ppx: per primary -Weight bearing status: WBAT RLE -PT evaluate and treat -Pain control -Dispo: per primary  ___________________________________________________________________________  Subjective: No acute events overnight. Remains on the floor. Hip pain better controlled this morning. Not having knee pain this morning. Denies paresthesias and numbness.   Physical Exam:  General: no acute distress, appears stated age Neurologic: alert, answering questions appropriately, following commands Respiratory: unlabored breathing on room air, symmetric chest rise Psychiatric: appropriate affect, normal cadence to speech Skin: most proximal dressing with small amount of dry blood, dressings otherwise c/d/i  MSK:   -Right lower extremity Fires quadriceps, hamstrings, tibialis anterior, gastrocnemius and soleus, extensor hallucis longus Does not fire hip flexors due to pain Plantarflexes and dorsiflexes toes Sensation intact to light touch in sural, saphenous, tibial, deep peroneal, and superficial peroneal nerve distributions Foot warm and well perfused   Patient name: Frank Hernandez Patient MRN: [id number] Date: 04/18/22

## 2022-04-18 NOTE — Plan of Care (Signed)
  Problem: Education: Goal: Knowledge of General Education information will improve Description Including pain rating scale, medication(s)/side effects and non-pharmacologic comfort measures Outcome: Progressing   

## 2022-04-19 ENCOUNTER — Encounter (HOSPITAL_COMMUNITY): Payer: Self-pay | Admitting: Orthopedic Surgery

## 2022-04-19 DIAGNOSIS — N1831 Chronic kidney disease, stage 3a: Secondary | ICD-10-CM | POA: Diagnosis not present

## 2022-04-19 DIAGNOSIS — C419 Malignant neoplasm of bone and articular cartilage, unspecified: Secondary | ICD-10-CM | POA: Diagnosis not present

## 2022-04-19 DIAGNOSIS — S7221XP Displaced subtrochanteric fracture of right femur, subsequent encounter for closed fracture with malunion: Secondary | ICD-10-CM

## 2022-04-19 DIAGNOSIS — E559 Vitamin D deficiency, unspecified: Secondary | ICD-10-CM

## 2022-04-19 DIAGNOSIS — C9000 Multiple myeloma not having achieved remission: Secondary | ICD-10-CM

## 2022-04-19 DIAGNOSIS — I10 Essential (primary) hypertension: Secondary | ICD-10-CM | POA: Diagnosis not present

## 2022-04-19 LAB — BASIC METABOLIC PANEL
Anion gap: 8 (ref 5–15)
BUN: 38 mg/dL — ABNORMAL HIGH (ref 8–23)
CO2: 19 mmol/L — ABNORMAL LOW (ref 22–32)
Calcium: 7.7 mg/dL — ABNORMAL LOW (ref 8.9–10.3)
Chloride: 108 mmol/L (ref 98–111)
Creatinine, Ser: 1.6 mg/dL — ABNORMAL HIGH (ref 0.61–1.24)
GFR, Estimated: 43 mL/min — ABNORMAL LOW (ref >60)
Glucose, Bld: 178 mg/dL — ABNORMAL HIGH (ref 70–99)
Potassium: 4 mmol/L (ref 3.5–5.1)
Sodium: 135 mmol/L (ref 135–145)

## 2022-04-19 LAB — CBC
HCT: 28.1 % — ABNORMAL LOW (ref 39.0–52.0)
Hemoglobin: 9.2 g/dL — ABNORMAL LOW (ref 13.0–17.0)
MCH: 30.4 pg (ref 26.0–34.0)
MCHC: 32.7 g/dL (ref 30.0–36.0)
MCV: 92.7 fL (ref 80.0–100.0)
Platelets: 119 10*3/uL — ABNORMAL LOW (ref 150–400)
RBC: 3.03 MIL/uL — ABNORMAL LOW (ref 4.22–5.81)
RDW: 15.4 % (ref 11.5–15.5)
WBC: 8.3 10*3/uL (ref 4.0–10.5)
nRBC: 0 % (ref 0.0–0.2)

## 2022-04-19 MED ORDER — CALCIUM CARBONATE 1250 (500 CA) MG PO TABS
1.0000 | ORAL_TABLET | Freq: Every day | ORAL | Status: DC
  Administered 2022-04-20 – 2022-04-21 (×2): 1250 mg via ORAL
  Filled 2022-04-19 (×2): qty 1

## 2022-04-19 MED ORDER — ACETAMINOPHEN 500 MG PO TABS: 1000.0000 mg | ORAL_TABLET | Freq: Three times a day (TID) | ORAL | 0 refills | Status: AC

## 2022-04-19 MED ORDER — ASPIRIN 81 MG PO TBEC: 81.0000 mg | DELAYED_RELEASE_TABLET | Freq: Two times a day (BID) | ORAL | 0 refills | Status: DC

## 2022-04-19 MED ORDER — SENNOSIDES-DOCUSATE SODIUM 8.6-50 MG PO TABS: 1.0000 | ORAL_TABLET | Freq: Two times a day (BID) | ORAL | 0 refills | Status: AC

## 2022-04-19 MED ORDER — OXYCODONE HCL 5 MG PO TABS: 5.0000 mg | ORAL_TABLET | ORAL | 0 refills | Status: DC | PRN

## 2022-04-19 MED ORDER — VITAMIN D (ERGOCALCIFEROL) 1.25 MG (50000 UNIT) PO CAPS
50000.0000 [IU] | ORAL_CAPSULE | ORAL | Status: DC
  Administered 2022-04-19: 50000 [IU] via ORAL
  Filled 2022-04-19: qty 1

## 2022-04-19 MED ORDER — CHOLECALCIFEROL 10 MCG (400 UNIT) PO TABS: 50000.0000 [IU] | ORAL_TABLET | ORAL | Status: DC

## 2022-04-19 MED ORDER — POLYETHYLENE GLYCOL 3350 17 G PO PACK: 17.0000 g | PACK | Freq: Every day | ORAL | 0 refills | Status: DC

## 2022-04-19 NOTE — Progress Notes (Signed)
Orthopedic Surgery Progress Note   Assessment: Patient is a 82 y.o. male with right pertrochanteric femur fracture s/p biopsy and CMN   Plan: -Operative plans: complete -Follow up intra-op pathology -Regular diet -DVT ppx: per primary -Weight bearing status: WBAT RLE -PT evaluate and treat -Pain control -Dispo: per primary  ___________________________________________________________________________  Subjective: No acute events overnight. Remains on the floor. Not having much pain when in bed. Has not tried to walk yet. Ready for therapy today. No pain besides in his right hip. Denies paresthesias and numbness.   Physical Exam:  General: no acute distress, appears stated age Neurologic: alert, answering questions appropriately, following commands Respiratory: unlabored breathing on room air, symmetric chest rise Psychiatric: appropriate affect, normal cadence to speech Skin: most proximal dressing with small amount of dry blood, dressings otherwise c/d/i  MSK:   -Right lower extremity Fires quadriceps, hamstrings, tibialis anterior, gastrocnemius and soleus, extensor hallucis longus Does not fire hip flexors due to pain Plantarflexes and dorsiflexes toes Sensation intact to light touch in sural, saphenous, tibial, deep peroneal, and superficial peroneal nerve distributions Foot warm and well perfused   Patient name: Frank Hernandez Patient MRN: [id number] Date: 04/19/22

## 2022-04-19 NOTE — Plan of Care (Signed)
  Problem: Nutrition: Goal: Adequate nutrition will be maintained Outcome: Progressing   Problem: Elimination: Goal: Will not experience complications related to urinary retention Outcome: Progressing   Problem: Pain Managment: Goal: General experience of comfort will improve Outcome: Progressing   

## 2022-04-19 NOTE — Evaluation (Signed)
Occupational Therapy Evaluation Patient Details Name: Frank Hernandez MRN: [id number] DOB: [birth year] Today's Date: 04/19/2022   History of Present Illness Patient is a 82 year old male with recent hospitalization for R knee pain1/29/2024-04/05/2022 whereby he underwent aspiration and steroid injection felt secondary to osteoarthritis, d/c to SNF. Pt presented  back to [facility] with incr RLE pain. xray right/pelvis showed subtrochanteric fracture of the right femur, right knee with decreased soft tissue swelling no sizable residual  joint effusion, no acute osseous abnormality, severe medial compartment osteoarthrosis. CT right hip done with concerns for pathologic fracture, lytic lesions noted.  orthopedics consulted, ordered a CT chest abdomen and pelvis; pt underwent IM nail R femur d/t pathological fx +Bx on 04/17/22  -Hematology/oncology consultation pending. PMH: hypertension hyperlipidemia CKD stage IIIa, prediabetes, s/p L intramedullary intertrochanteric nailing for hip fracture 06/28/2020   Clinical Impression   Patient is a 82 year old male who was admitted for above. Patient was living at home independently with wife prior level. Currently, patient is +2 for sit to stand on STEDY with increased pain in RLE. Patient is total A for LB dress/bathing tasks. Patients wife is unable to offer this level of physical assistance at home. Patient would benefit from short term rehab to continue to gain independence in ADLs and reduce caregiver burden.  Patient would continue to benefit from skilled OT services at this time while admitted and after d/c to address noted deficits in order to improve overall safety and independence in ADLs.       Recommendations for follow up therapy are one component of a multi-disciplinary discharge planning process, led by the attending physician.  Recommendations may be updated based on patient status, additional functional criteria and insurance authorization.   Follow Up  Recommendations  Skilled nursing-short term rehab (<3 hours/day)     Assistance Recommended at Discharge Frequent or constant Supervision/Assistance  Patient can return home with the following Two people to help with walking and/or transfers;Two people to help with bathing/dressing/bathroom;Direct supervision/assist for financial management;Help with stairs or ramp for entrance;Assist for transportation;Direct supervision/assist for medications management;Assistance with cooking/housework    Functional Status Assessment  Patient has had a recent decline in their functional status and demonstrates the ability to make significant improvements in function in a reasonable and predictable amount of time.  Equipment Recommendations  None recommended by OT       Precautions / Restrictions Precautions Precautions: Fall Precaution Comments: denies falls in past 6 months Restrictions Weight Bearing Restrictions: No RLE Weight Bearing: Weight bearing as tolerated      Mobility Bed Mobility Overal bed mobility: Needs Assistance         Sit to supine: Total assist, +2 for physical assistance, +2 for safety/equipment   General bed mobility comments: with increased time and physical A to transition from sit to supine in bed.    Transfers Overall transfer level: Needs assistance   Transfers: Sit to/from Stand Sit to Stand: +2 safety/equipment, +2 physical assistance, Total assist (from recliner on STEDY with increased A to transition off low surface.)                  Balance Overall balance assessment: Needs assistance Sitting-balance support: No upper extremity supported, Feet supported Sitting balance-Leahy Scale: Fair     Standing balance support: Bilateral upper extremity supported, Reliant on assistive device for balance Standing balance-Leahy Scale: Poor Standing balance comment: reliant on UEs and external assist  ADL either performed or assessed with  clinical judgement   ADL Overall ADL's : Needs assistance/impaired Eating/Feeding: Set up;Sitting   Grooming: Set up;Sitting   Upper Body Bathing: Minimal assistance;Sitting   Lower Body Bathing: Total assistance;Bed level   Upper Body Dressing : Minimal assistance;Sitting   Lower Body Dressing: Total assistance;Bed level   Toilet Transfer: +2 for physical assistance;+2 for safety/equipment;Total assistance Toilet Transfer Details (indicate cue type and reason): with STEDY with education provided on proper positioning of BLE to improve abiity to participate in sit to stands. patient needed physical A to maintain standing to transition into bed. patient increasingly difficutly to transition into upright standing out of recliner with height. Toileting- Clothing Manipulation and Hygiene: Bed level;Total assistance Toileting - Clothing Manipulation Details (indicate cue type and reason): patient was noted to have very loose stool x3 during attempts to transfer off recliner and back into bed. patient NT in room assisting with hygiene tasks.              Pertinent Vitals/Pain Pain Assessment Pain Assessment: Faces Faces Pain Scale: Hurts even more Pain Location: right hip Pain Descriptors / Indicators: Discomfort, Grimacing, Moaning Pain Intervention(s): Limited activity within patient's tolerance, Monitored during session, Repositioned, Premedicated before session     Hand Dominance Right   Extremity/Trunk Assessment Upper Extremity Assessment Upper Extremity Assessment: Overall WFL for tasks assessed   Lower Extremity Assessment Lower Extremity Assessment: Defer to PT evaluation   Cervical / Trunk Assessment Cervical / Trunk Assessment: Normal   Communication Communication Communication: No difficulties   Cognition Arousal/Alertness: Awake/alert Behavior During Therapy: WFL for tasks assessed/performed Overall Cognitive Status: Within Functional Limits for tasks  assessed       General Comments: patient is plesant and cooperative even with increased ammounts of pain noted. patients wife was present in room as well.                Home Living Family/patient expects to be discharged to:: Skilled nursing facility Living Arrangements: Spouse/significant other Available Help at Discharge: Family;Available 24 hours/day Type of Home: House Home Access: Ramped entrance     Home Layout: One level               Home Equipment: Conservation officer, nature (2 wheels);Wheelchair - power          Prior Functioning/Environment Prior Level of Function : Independent/Modified Independent               ADLs Comments: patient reported prior to recent hospitalizations he was independent in [place].        OT Problem List: Decreased activity tolerance;Impaired balance (sitting and/or standing);Decreased coordination;Decreased safety awareness;Decreased knowledge of precautions;Impaired sensation;Pain;Decreased knowledge of use of DME or AE      OT Treatment/Interventions: Self-care/ADL training;Energy conservation;Therapeutic exercise;DME and/or AE instruction;Therapeutic activities;Patient/family education;Balance training    OT Goals(Current goals can be found in the care plan section) Acute Rehab OT Goals Patient Stated Goal: to be able to transfer OT Goal Formulation: With patient Time For Goal Achievement: 05/03/22 Potential to Achieve Goals: Fair  OT Frequency: Min 2X/week       AM-PAC OT "6 Clicks" Daily Activity     Outcome Measure Help from another person eating meals?: A Little Help from another person taking care of personal grooming?: A Little Help from another person toileting, which includes using toliet, bedpan, or urinal?: Total Help from another person bathing (including washing, rinsing, drying)?: Total Help from another person to put on and taking off  regular upper body clothing?: A Little Help from another person to put on and  taking off regular lower body clothing?: A Lot 6 Click Score: 13   End of Session Equipment Utilized During Treatment: Rolling walker (2 wheels);Gait belt;Other (comment) (STEDY) Nurse Communication: Mobility status  Activity Tolerance: Patient limited by pain Patient left: in bed;with call bell/phone within reach;with bed alarm set;with family/visitor present  OT Visit Diagnosis: Unsteadiness on feet (R26.81);Other abnormalities of gait and mobility (R26.89);Muscle weakness (generalized) (M62.81);Pain Pain - Right/Left: Right Pain - part of body: Leg                Time: 1310-1336 OT Time Calculation (min): 26 min Charges:  OT General Charges $OT Visit: 1 Visit OT Evaluation $OT Eval Moderate Complexity: 1 Mod OT Treatments $Self Care/Home Management : 8-22 mins  Rennie Plowman, MS Acute Rehabilitation Department Office# [phone]   Willa Rough 04/19/2022, 3:37 PM

## 2022-04-19 NOTE — Evaluation (Signed)
Physical Therapy Evaluation Patient Details Name: Frank Hernandez MRN: [id number] DOB: 28-May-1940 Today's Date: 04/19/2022  History of Present Illness  82 year old male with recent hospitalization for R knee pain1/29/2024-04/05/2022 whereby he underwent aspiration and steroid injection felt secondary to osteoarthritis, d/c to SNF. Pt presented  back to [facility] with incr RLE pain. xray right/pelvis showed subtrochanteric fracture of the right femur, right knee with decreased soft tissue swelling no sizable residual  joint effusion, no acute osseous abnormality, severe medial compartment osteoarthrosis. CT right hip done with concerns for pathologic fracture, lytic lesions noted.  orthopedics consulted, ordered a CT chest abdomen and pelvis; pt underwent IM nail R femur d/t pathological fx +Bx on 04/17/22  -Hematology/oncology consultation pending. PT consult ordered per Hospitalist on 04/18/22. PMH: hypertension hyperlipidemia CKD stage IIIa, prediabetes, s/p L intramedullary intertrochanteric nailing for hip fracture 06/28/2020  Clinical Impression  Pt admitted with above diagnosis.  Pt assisted OOB to chair, requiring 2 person assist and limited by pain/fatigue; cooperative and motivated to work with PT. Recommend pt return to SNF, will follow and continue efforts to mobilize.  Pt currently with functional limitations due to the deficits listed below (see PT Problem List). Pt will benefit from skilled PT to increase their independence and safety with mobility to allow discharge to the venue listed below.          Recommendations for follow up therapy are one component of a multi-disciplinary discharge planning process, led by the attending physician.  Recommendations may be updated based on patient status, additional functional criteria and insurance authorization.  Follow Up Recommendations Skilled nursing-short term rehab (<3 hours/day) Can patient physically be transported by private vehicle: No     Assistance Recommended at Discharge Frequent or constant Supervision/Assistance  Patient can return home with the following  Two people to help with walking and/or transfers;Two people to help with bathing/dressing/bathroom;Assistance with cooking/housework;Assist for transportation;Help with stairs or ramp for entrance    Equipment Recommendations None recommended by PT  Recommendations for Other Services       Functional Status Assessment Patient has had a recent decline in their functional status and demonstrates the ability to make significant improvements in function in a reasonable and predictable amount of time.     Precautions / Restrictions Precautions Precautions: Fall Precaution Comments: denies falls in past 6 months Restrictions Weight Bearing Restrictions: No RLE Weight Bearing: Weight bearing as tolerated      Mobility  Bed Mobility Overal bed mobility: Needs Assistance Bed Mobility: Supine to Sit     Supine to sit: Mod assist, +2 for physical assistance, +2 for safety/equipment, HOB elevated     General bed mobility comments: cues increaed time for supine<> sit; use of R bed rail, assist with trunk and to progress RLE off bed    Transfers Overall transfer level: Needs assistance Equipment used: Rolling walker (2 wheels) Transfers: Sit to/from Stand Sit to Stand: Mod assist, +2 physical assistance, +2 safety/equipment, From elevated surface           General transfer comment: assist to rise and transition to RW. cues for LE position and hand placement    Ambulation/Gait Ambulation/Gait assistance: Mod assist Gait Distance (Feet):  (3-4 steps fwd, then pivotal steps to chair) Assistive device: Rolling walker (2 wheels) Gait Pattern/deviations: Step-to pattern, Decreased stance time - right       General Gait Details: cues for sequence, use of RW and UEs; difficulty advancing RLE and LLE d/t pain with hip flexion and  pain with [place]  Stairs             Wheelchair Mobility    Modified Rankin (Stroke Patients Only)       Balance Overall balance assessment: Needs assistance Sitting-balance support: No upper extremity supported, Feet supported Sitting balance-Leahy Scale: Fair     Standing balance support: Bilateral upper extremity supported, Reliant on assistive device for balance Standing balance-Leahy Scale: Poor Standing balance comment: reliant on UEs and external assist                             Pertinent Vitals/Pain Pain Assessment Pain Assessment: Faces Faces Pain Scale: Hurts even more Pain Location: right hip Pain Descriptors / Indicators: Discomfort, Grimacing, Moaning Pain Intervention(s): Limited activity within patient's tolerance, Monitored during session, Premedicated before session, Repositioned, Ice applied    Home Living Family/patient expects to be discharged to:: Skilled nursing facility Living Arrangements: Spouse/significant other Available Help at Discharge: Family;Available 24 hours/day Type of Home: House Home Access: Ramped entrance       Home Layout: One level Home Equipment: Conservation officer, nature (2 wheels);Wheelchair - power      Prior Function Prior Level of Function : Independent/Modified Independent             Mobility Comments: until ~ 3 wks ago walked independently with RW       Hand Dominance        Extremity/Trunk Assessment   Upper Extremity Assessment Upper Extremity Assessment: Defer to OT evaluation;Overall WFL for tasks assessed    Lower Extremity Assessment Lower Extremity Assessment: RLE deficits/detail RLE Deficits / Details: flexes knee and hip to ~ 30 degrees AAROM RLE: Unable to fully assess due to pain       Communication   Communication: No difficulties  Cognition Arousal/Alertness: Awake/alert Behavior During Therapy: WFL for tasks assessed/performed Overall Cognitive Status: Within Functional Limits for tasks assessed                                           General Comments      Exercises General Exercises - Lower Extremity Ankle Circles/Pumps: AROM, 5 reps, Both Heel Slides: AAROM, Right, 5 reps, Supine   Assessment/Plan    PT Assessment Patient needs continued PT services  PT Problem List Decreased strength;Decreased range of motion;Decreased activity tolerance;Decreased mobility;Pain;Decreased balance;Decreased knowledge of precautions;Decreased knowledge of use of DME       PT Treatment Interventions DME instruction;Gait training;Functional mobility training;Therapeutic activities;Patient/family education;Therapeutic exercise    PT Goals (Current goals can be found in the Care Plan section)  Acute Rehab PT Goals Patient Stated Goal: return to walking PT Goal Formulation: With patient Time For Goal Achievement: 05/03/22 Potential to Achieve Goals: Fair    Frequency Min 3X/week     Co-evaluation               AM-PAC PT "6 Clicks" Mobility  Outcome Measure Help needed turning from your back to your side while in a flat bed without using bedrails?: Total Help needed moving from lying on your back to sitting on the side of a flat bed without using bedrails?: Total Help needed moving to and from a bed to a chair (including a wheelchair)?: Total Help needed standing up from a chair using your arms (e.g., wheelchair or bedside chair)?: Total Help needed to  walk in hospital room?: Total Help needed climbing 3-5 steps with a railing? : Total 6 Click Score: 6    End of Session Equipment Utilized During Treatment: Gait belt Activity Tolerance: Patient limited by pain;Patient limited by fatigue Patient left: in chair;with call bell/phone within reach;with chair alarm set   PT Visit Diagnosis: Difficulty in walking, not elsewhere classified (R26.2);Pain Pain - Right/Left: Right Pain - part of body: Hip    Time: [id number] PT Time Calculation (min) (ACUTE ONLY): 19  min   Charges:   PT Evaluation $PT Eval Low Complexity: [place], PT  Acute Rehab Dept [facility] [phone]  [facility] Weekend [facility] only)  [phone]  04/19/2022   [facility] 04/19/2022, 11:02 AM

## 2022-04-19 NOTE — Discharge Instructions (Addendum)
Orthopedic Surgery Discharge Instructions  Patient name: Frank Hernandez Fracture: Right subtrochanteric femur fracture Procedure Performed: Right hip cephalomedullary nail Date of Surgery: 04/17/2022 Surgeon: Ileene Rubens, MD  Activity: You are allowed to put as much weight on your leg as you would like. You can walk as much as you would like. You can perform household activities such as cleaning dishes, doing laundry, vacuuming, etc.  Incision Care: Your incision site has a dressing over it. That dressing should remain in place and dry at all times for a total of one week after surgery. After one week, you can remove the dressing. Underneath the dressing, you will find skin staples. You should leave these staples in place. They will be taken out in the office when the wound has healed. Do not pick, rub, or scrub at them. Do not put cream or lotion over the surgical area. After one week and once the dressing is off, it is okay to let soap and water run over your incision. Again, do not pick, scrub, or rub at the staples when bathing. Do not submerge (e.g., take a bath, swim, go in a hot tub, etc.) until six weeks after surgery. There may be some bloody drainage from the incision into the dressing after surgery. This is normal. You do not need to replace the dressing. Continue to leave it in place for the one week as instructed above. Should the dressing become saturated with blood or drainage, please call the office for further instructions.   Medications: You have been prescribed oxycodone. This is a narcotic pain medication and should only be taken as prescribed. You should not drink alcohol or operate heavy machinery (including driving) while taking this medication. The oxycodone can cause constipation as a side effect. For that reason, you have been prescribed senna and miralax. These are both laxatives. You do not need to take this medication if you develop diarrhea. Should you remain  constipated even while taking the senna and miralax, please use the miralax twice daily. Tylenol has been prescribed to be taken every 8 hours, which will give you additional pain relief.   You have been prescribed aspirin as a blood thinner. This medication is to be taken to prevent blood clots. Take 81 milligrams twice daily. You should refrain from using other blood thinners (warfarin, apixaban, plavix, xarelto, etc.) while using the aspirin. You will need to take this medication for a total of 6 weeks after your surgery.   You should not use over-the-counter NSAIDs (ibuprofen, Aleve, Celebrex, naproxen, meloxicam, etc.) for pain relief because aspirin is a similar medication. There can be side effects including but not limited to kidney injury and ulcers if you take these type of medications with the aspirin.  In order to set expectations for opioid prescriptions, you will only be prescribed opioids for a total of six weeks after surgery and, at two-weeks after surgery, your opioid prescription will start to tapered (decreased dosage and number of pills). If you have ongoing need for opioid medication six weeks after surgery, you will be referred to pain management. If you are already established with a provider that is giving you opioid medications, you should schedule an appointment with them for six weeks after surgery if you feel you are going to need another prescription. State law only allows for opioid prescriptions one week at a time. If you are running out of opioid medication near the end of the week, please call the office during business hours before  running out so I can send you another prescription.   Driving: You should not drive while taking narcotic pain medications. You should start getting back to driving slowly and you may want to try driving in a parking lot before doing anything more.   Diet: You are safe to resume your regular diet after surgery.   Reasons to Call the Office  After Surgery: You should feel free to call the office with any concerns or questions you have in the post-operative period, but you should definitely notify the office if you develop: -shortness of breath, chest pain, or trouble breathing -excessive bleeding, drainage, redness, or swelling around the surgical site -fevers, chills, or pain that is getting worse with each passing day -persistent nausea or vomiting -new weakness in any extremity, new or worsening numbness or tingling in any extremity -numbness in the groin, bowel or bladder incontinence -other concerns about your surgery  If you are draining at POD#10 or later, that is concerning for infection and you should get in contact with the office to get further instructions  Follow Up Appointments: You should call the office to schedule a follow up appointment sometime between 2 and 3 weeks after your surgery. Pick a time and date that is convenient for you. The office number is listed below.   Office Information:  -Ileene Rubens, MD -Phone number: [phone] -Address: [address]       [place], [place] [address]

## 2022-04-19 NOTE — Progress Notes (Addendum)
PROGRESS NOTE    Frank Hernandez  [id number] DOB: Jun 08, 1940 DOA: 04/13/2022 PCP: [name] (Inactive)    Chief Complaint  Patient presents with   Right Femur Fracture     Brief Narrative:  Patient is a pleasant 82 year old gentleman, ambulates with the help of a walker recent hospitalization, history of hypertension hyperlipidemia CKD stage IIIa, prediabetes, status post left intramedullary intertrochanteric nailing for hip fracture 06/28/2020, recent hospitalization for nontraumatic right knee pain and effusion 03/29/2022-04/05/2022 whereby he underwent aspiration and steroid injection felt secondary to osteoarthritis sent to SNF presenting back with right hip pain.  Plain films of the right hip and pelvis showed subtrochanteric fracture of the right femur, plain films of the right knee with decreased soft tissue swelling no sizable residual visible joint effusion, no acute osseous abnormality, severe medial compartment osteoarthrosis.  CT right hip done with concerns for pathologic fracture, lytic lesions noted.  Patient seen in consultation by orthopedics who have ordered a CT chest abdomen and pelvis, SPEP. -Hematology/oncology consultation pending.   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 -Patient presented from SNF with complaints of right hip pain, no recent fall or trauma noted noted to be spontaneous. -Plain films of the right femur done with subtrochanteric fracture of the right femur. -Plain films of the right hip and  pelvis with subtrochanteric fracture of the right femur. -Orthopedics consulted by EDP, patient seen in consultation by Dr. Laurance Flatten who recommended CT right hip with plans for possible surgery today 04/14/2022. -CT right hip done with severely displaced fracture involving the intratrochanteric region of proximal right femur, lucency noted in the right femoral neck and intertrochanteric region suggesting the possibility of lytic lesion and pathologic fracture.  There appears to be cortical defect involving the right inferior pubic ramus suggesting possible metastatic lesion in that area.  MRI may be performed for further evaluation. 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  No obvious primary neoplastic process identified.  Large right inguinal hernia containing small bowel loops and part of the bladder.  No acute pulmonary findings of pulmonary nodules.  No acute abdominal pelvic findings, mass lesions or adenopathy.  SPEP/UPEP pending.  -Beta-2 microglobulin elevated at 3.1. -PSA elevated at 8.56. Kappa free light chain elevated at 120, kappa lambda light chain ratio at 8.33. -NWB RLE -Per orthopedics patient underwent surgical intervention as well as biopsy,  04/17/2022..   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 -Per oncology will need tissue sampling which was obtained during fixation of his right femoral fracture on 04/17/2022. -Oncology following and appreciate input and recommendations. -PT OT following, needs  SNF. -Per orthopedics.  2.  Hypertension -Cardura, Norvasc.  3.  CKD stage IIIa -Stable. -Patient and wife deny any prior history of chronic kidney disease. -Slight bump in creatinine, which has improved with gentle hydration.  -Follow-up.  4.  Hyperlipidemia -Statin.  5.  Anemia of chronic disease -Patient with no overt bleeding preoperatively. -Postop surgical wound site with dressing soaked in blood. -Status post preop transfusion of 1 unit packed red blood cells 04/16/2022. -Hemoglobin currently at 8.0 this morning.   -Repeat hemoglobin at 7.2 the afternoon of 04/18/2022. -Anemia panel done 04/02/2022 with iron of 37, TIBC 251, ferritin 165, folate 8.6. -Patient transfused 2 more units PRBC 04/18/2022 with follow-up CBC with hemoglobin at 9.2.  -Follow H&H. -Transfusion threshold hemoglobin < 8.  6.  Prediabetes -Hemoglobin A1c 6.0. -Outpatient follow-up.  7.  Vitamin D deficiency Vitamin D level of 25 hydroxy at 14.23.   -Vitamin D 50,000 units q. weekly for 6 to 8 weeks and then 800 units daily.   DVT prophylaxis: Lovenox Code Status: Full Family Communication: Updated patient.  No family at bedside. Disposition: SNF.   Status is: Inpatient Remains inpatient appropriate because: Severity of illness   Consultants:  Orthopedics: Dr. Laurance Flatten 04/14/2022 Oncology: Dr. Irene Limbo 04/14/2022  Procedures:  CT right hip 04/13/2022 Plain films of the right femur 04/13/2022 Plain films of the right knee 04/13/2022 CT chest abdomen and pelvis 04/14/2022 Right pathologic subtrochanteric femur fracture status post biopsy right proximal femur by Dr. Laurance Flatten 04/17/2022. Transfuse 1 unit packed red blood cells 04/16/2022 Transfused 2 units PRBC 04/18/2022  Antimicrobials:  Anti-infectives (From admission, onward)    Start     Dose/Rate Route Frequency Ordered Stop   04/17/22 1600  ceFAZolin (ANCEF) IVPB 2g/100 mL premix        2 g 200 mL/hr over 30 Minutes Intravenous Every 8 hours  04/17/22 1304 04/18/22 0100         Subjective: Sitting up in chair.  States just finished working with PT.  States saw orthopedic doctor this morning.  No chest pain.  No shortness of breath.  No abdominal pain.  Some improvement with right hip pain.   Objective: Vitals:   04/19/22 0051 04/19/22 0124 04/19/22 0359 04/19/22 0442  BP: 118/64 117/60 126/72 121/67  Pulse: 83 83 97 85  Resp:  12 14 15  $ Temp: 98.4 F (36.9 C) 98.6 F (37 C) 98.9 F (37.2 C) 98.7 F (37.1 C)  TempSrc: Oral Oral  Oral  SpO2: 99% 100% 100% 100%  Weight:      Height:        Intake/Output Summary (Last 24 hours) at 04/19/2022 1127 Last data filed at 04/19/2022 0852 Gross per 24 hour  Intake 3587.38 ml  Output 600 ml  Net 2987.38 ml    Filed Weights   04/17/22 1318  Weight: 94 kg    Examination:  General exam: NAD. Respiratory system: CTAB.  No wheezes, no crackles, no rhonchi.  Fair air movement.  Speaking in full sentences.  Cardiovascular system: Regular rate rhythm no murmurs rubs or gallops.  No JVD.  No lower extremity edema.  Gastrointestinal system: [place] is soft, nontender, nondistended, positive bowel sounds.  No rebound.  No guarding.   Central nervous system: Alert oriented.  Moving extremities spontaneously.  No focal neurological deficits.   Extremities: Right hip with postop dressing in place.  Decreased tenderness to palpation in the right hip.  No lower extremity edema.  Skin: No rashes, lesions or ulcers Psychiatry: Judgement and insight appear normal. Mood & affect appropriate.  Data Reviewed: I have personally reviewed following labs and imaging studies  CBC: Recent Labs  Lab 04/13/22 1101 04/14/22 0439 04/15/22 0420 04/16/22 0502 04/17/22 0423 04/18/22 0306 04/18/22 1346 04/19/22 0844  WBC 8.3   < > 6.9 6.4 7.5 9.2  --  8.3  NEUTROABS 6.9  --   --  4.4 5.1 7.6  --   --   HGB 9.1*   < > 8.4* 8.5* 9.9* 8.0* 7.2* 9.2*  HCT 27.9*   < > 26.0* 26.0* 30.2*  25.1* 22.3* 28.1*  MCV 93.3   < > 94.9 94.2 91.5 94.7  --  92.7  PLT 254   < > 215 209 208 161  --  119*   < > = values in this interval not displayed.     Basic Metabolic Panel: Recent Labs  Lab 04/15/22 0420 04/16/22 0502 04/17/22 0423 04/18/22 0306 04/19/22 0844  NA 132* 135 135 132* 135  K 4.6 4.6 4.7 5.1 4.0  CL 103 105 105 104 108  CO2 22 24 23 $ 19* 19*  GLUCOSE 81 81 90 148* 178*  BUN 36* 29* 31* 37* 38*  CREATININE 1.56* 1.45* 1.49* 1.97* 1.60*  CALCIUM 8.7* 8.7* 8.8* 8.2* 7.7*  MG 2.2  --   --   --   --   PHOS  --  3.2  --   --   --      GFR: Estimated Creatinine Clearance: 42 mL/min (A) (by C-G formula based on SCr of 1.6 mg/dL (H)).  Liver Function Tests: Recent Labs  Lab 04/16/22 0502 04/17/22 0423  AST  --  30  ALT  --  24  ALKPHOS  --  142*  BILITOT  --  1.0  PROT  --  6.8  ALBUMIN 2.9* 2.9*     CBG: No results for input(s): "GLUCAP" in the last 168 hours.   Recent Results (from the past 240 hour(s))  Surgical pcr screen     Status: None   Collection Time: 04/13/22  8:59 PM   Specimen: Nasal Mucosa; Nasal Swab  Result Value Ref Range Status   MRSA, PCR NEGATIVE NEGATIVE Final   Staphylococcus aureus NEGATIVE NEGATIVE Final    Comment: (NOTE) The Xpert SA Assay (FDA approved for NASAL specimens in patients 29 years of age and older), is one component of a comprehensive surveillance program. It is not intended to diagnose infection nor to guide or monitor treatment. Performed at [facility], [place] [address] [place], [place] [address]   Surgical pcr screen     Status: None   Collection Time: 04/17/22  6:41 AM   Specimen: Nasal Mucosa; Nasal Swab  Result Value Ref Range Status   MRSA, PCR NEGATIVE NEGATIVE Final   Staphylococcus aureus NEGATIVE NEGATIVE Final    Comment: (NOTE) The Xpert SA Assay (FDA approved for NASAL specimens in patients 44 years of age and older), is one component of a  comprehensive surveillance program. It is not intended to diagnose infection nor to guide or monitor treatment. Performed at [facility], [place] [address] [place], [place] [address]          Radiology Studies: DG FEMUR, MIN 2 VIEWS RIGHT  Result Date: 04/17/2022 CLINICAL DATA:  Postop EXAM: RIGHT FEMUR 2 VIEWS COMPARISON:  04/13/2022. FINDINGS: Recent placement intramedullary rod in the femur and compression screw fixation of the right femoral neck with grossly anatomic alignment. No evidence of hardware failure. IMPRESSION: Unremarkable post op changes  ORIF right femoral neck and femur. Electronically Signed   By: Sammie Bench M.D.   On: 04/17/2022 13:59        Scheduled Meds:  acetaminophen  1,000 mg Oral TID   amLODipine  10 mg Oral Daily   atorvastatin  40 mg Oral QPM   [START ON 04/20/2022] calcium carbonate  1 tablet Oral Q breakfast   cholecalciferol  50,000 Units Oral Weekly   doxazosin  2 mg Oral QPM   enoxaparin (LOVENOX) injection  40 mg Subcutaneous Q24H   feeding supplement  237 mL Oral Q24H   ferrous sulfate  325 mg Oral BID   multivitamins with iron  1 tablet Oral Daily   senna-docusate  1 tablet Oral BID   sodium chloride flush  3 mL Intravenous Q12H   Continuous Infusions:  sodium chloride 50 mL/hr at 04/19/22 0459     LOS: 6 days    Time spent: 40 minutes    Irine Seal, MD [facility]   To contact the attending provider between 7A-7P or the covering provider during after hours 7P-7A, please log into the web site [email or web address] and access using universal [place] password for that web site. If you do not have the password, please call the hospital operator.  04/19/2022, 11:27 AM

## 2022-04-19 NOTE — Care Management Important Message (Signed)
Important Message  Patient Details IM Letter given Name: PACER WEHR MRN: [id number] Date of Birth: [birth year]   Medicare Important Message Given:  Yes     Kerin Salen 04/19/2022, 3:09 PM

## 2022-04-20 DIAGNOSIS — C419 Malignant neoplasm of bone and articular cartilage, unspecified: Secondary | ICD-10-CM | POA: Diagnosis not present

## 2022-04-20 DIAGNOSIS — S7221XP Displaced subtrochanteric fracture of right femur, subsequent encounter for closed fracture with malunion: Secondary | ICD-10-CM | POA: Diagnosis not present

## 2022-04-20 DIAGNOSIS — N1831 Chronic kidney disease, stage 3a: Secondary | ICD-10-CM | POA: Diagnosis not present

## 2022-04-20 DIAGNOSIS — I10 Essential (primary) hypertension: Secondary | ICD-10-CM | POA: Diagnosis not present

## 2022-04-20 LAB — RENAL FUNCTION PANEL
Albumin: 2.3 g/dL — ABNORMAL LOW (ref 3.5–5.0)
Anion gap: 5 (ref 5–15)
BUN: 34 mg/dL — ABNORMAL HIGH (ref 8–23)
CO2: 21 mmol/L — ABNORMAL LOW (ref 22–32)
Calcium: 7.6 mg/dL — ABNORMAL LOW (ref 8.9–10.3)
Chloride: 109 mmol/L (ref 98–111)
Creatinine, Ser: 1.4 mg/dL — ABNORMAL HIGH (ref 0.61–1.24)
GFR, Estimated: 51 mL/min — ABNORMAL LOW (ref >60)
Glucose, Bld: 88 mg/dL (ref 70–99)
Phosphorus: 2.1 mg/dL — ABNORMAL LOW (ref 2.5–4.6)
Potassium: 3.9 mmol/L (ref 3.5–5.1)
Sodium: 135 mmol/L (ref 135–145)

## 2022-04-20 LAB — CBC
HCT: 23.8 % — ABNORMAL LOW (ref 39.0–52.0)
Hemoglobin: 7.9 g/dL — ABNORMAL LOW (ref 13.0–17.0)
MCH: 30.6 pg (ref 26.0–34.0)
MCHC: 33.2 g/dL (ref 30.0–36.0)
MCV: 92.2 fL (ref 80.0–100.0)
Platelets: 116 10*3/uL — ABNORMAL LOW (ref 150–400)
RBC: 2.58 MIL/uL — ABNORMAL LOW (ref 4.22–5.81)
RDW: 15.1 % (ref 11.5–15.5)
WBC: 6.4 10*3/uL (ref 4.0–10.5)
nRBC: 0 % (ref 0.0–0.2)

## 2022-04-20 LAB — TYPE AND SCREEN
ABO/RH(D): A POS
Antibody Screen: NEGATIVE
Unit division: 0
Unit division: 0
Unit division: 0

## 2022-04-20 LAB — BPAM RBC
Blood Product Expiration Date: 202403102359
Blood Product Expiration Date: 202403102359
Blood Product Expiration Date: 202403122359
ISSUE DATE / TIME: 202402162333
ISSUE DATE / TIME: 202402182056
ISSUE DATE / TIME: 202402190055
Unit Type and Rh: 6200
Unit Type and Rh: 6200
Unit Type and Rh: 6200

## 2022-04-20 LAB — UPEP/UIFE/LIGHT CHAINS/TP, 24-HR UR
% BETA, Urine: 0 %
ALPHA 1 URINE: 0 %
Albumin, U: 100 %
Alpha 2, Urine: 0 %
Free Kappa Lt Chains,Ur: 9.33 mg/L (ref 1.17–86.46)
Free Kappa/Lambda Ratio: 8.8 (ref 1.83–14.26)
Free Lambda Lt Chains,Ur: 1.06 mg/L (ref 0.27–15.21)
GAMMA GLOBULIN URINE: 0 %
Total Protein, Urine-Ur/day: 80 mg/24 hr (ref 30–150)
Total Protein, Urine: 5 mg/dL
Total Volume: 1600

## 2022-04-20 NOTE — Progress Notes (Addendum)
PROGRESS NOTE    Frank Hernandez  [id number] DOB: 1940-08-30 DOA: 04/13/2022 PCP: [name] (Inactive)    Chief Complaint  Patient presents with   Right Femur Fracture     Brief Narrative:  Patient is a pleasant 82 year old gentleman, ambulates with the help of a walker recent hospitalization, history of hypertension hyperlipidemia CKD stage IIIa, prediabetes, status post left intramedullary intertrochanteric nailing for hip fracture 06/28/2020, recent hospitalization for nontraumatic right knee pain and effusion 03/29/2022-04/05/2022 whereby he underwent aspiration and steroid injection felt secondary to osteoarthritis sent to SNF presenting back with right hip pain.  Plain films of the right hip and pelvis showed subtrochanteric fracture of the right femur, plain films of the right knee with decreased soft tissue swelling no sizable residual visible joint effusion, no acute osseous abnormality, severe medial compartment osteoarthrosis.  CT right hip done with concerns for pathologic fracture, lytic lesions noted.  Patient seen in consultation by orthopedics who have ordered a CT chest abdomen and pelvis, SPEP. -Hematology/oncology consultation pending.   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 -Patient presented from SNF with complaints of right hip pain, no recent fall or trauma noted, fracture noted to be spontaneous. -Plain films of the right femur done with subtrochanteric fracture of the right femur. -Plain films of the right  hip and pelvis with subtrochanteric fracture of the right femur. -Orthopedics consulted by EDP, patient seen in consultation by Dr. Laurance Flatten who recommended CT right hip with plans for possible surgery today 04/14/2022. -CT right hip done with severely displaced fracture involving the intratrochanteric region of proximal right femur, lucency noted in the right femoral neck and intertrochanteric region suggesting the possibility of lytic lesion and pathologic fracture.  There appears to be cortical defect involving the right inferior pubic ramus suggesting possible metastatic lesion in that area.  MRI may be performed for further evaluation. 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  No obvious primary neoplastic process identified.  Large right inguinal hernia containing small bowel loops and part of the bladder.  No acute pulmonary findings of pulmonary nodules.  No acute abdominal pelvic findings, mass lesions or adenopathy.  SPEP/UPEP pending.  -Beta-2 microglobulin elevated at 3.1. -PSA elevated at 8.56. Kappa free light chain elevated at 120, kappa lambda light chain ratio at 8.33. -NWB RLE -Per orthopedics patient underwent surgical intervention as well as biopsy,  04/17/2022..   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 -Per oncology will need tissue sampling which was obtained during fixation of his right femoral fracture on 04/17/2022. -Oncology following and appreciate input and recommendations. -PT /OT following, needs  SNF. -Per orthopedics.  2.  Hypertension -Continue Norvasc,  Cardura.   3.  AKI on CKD stage IIIa -Stable. -Patient and wife deny any prior history of chronic kidney disease. -Slight bump in creatinine, which has improved with gentle hydration.  -Saline lock IV fluids. -Follow-up.  4.  Hyperlipidemia -Continue statin.  5.  Anemia of chronic disease -Patient with no overt bleeding preoperatively. -Postop surgical wound site with dressing soaked in blood. -Status post preop transfusion of 1 unit packed red blood cells 04/16/2022, transfusion of 2 units PRBCs 04/18/2022. -Hemoglobin currently stable at 7.9. -Anemia panel done 04/02/2022 with iron of 37, TIBC 251, ferritin 165, folate 8.6. -Follow H&H. -Transfusion threshold hemoglobin < 7.  6.  Prediabetes -Hemoglobin A1c 6.0. -Outpatient follow-up.  7.  Vitamin D deficiency Vitamin D level of 25 hydroxy at 14.23.   -Continue Vitamin D 50,000 units q. weekly for 6 to 8 weeks and then 800 units daily.   DVT prophylaxis: Lovenox Code Status: Full Family Communication: Updated patient.  No family at bedside. Disposition: SNF.   Status is: Inpatient Remains inpatient appropriate because: Severity of illness   Consultants:  Orthopedics: Dr. Laurance Flatten 04/14/2022 Oncology: Dr. Irene Limbo 04/14/2022  Procedures:  CT right hip 04/13/2022 Plain films of the right femur 04/13/2022 Plain films of the right knee 04/13/2022 CT chest abdomen and pelvis 04/14/2022 Right pathologic subtrochanteric femur fracture status post biopsy right proximal femur by Dr. Laurance Flatten 04/17/2022. Transfuse 1 unit packed red blood cells 04/16/2022 Transfused 2 units PRBC 04/18/2022  Antimicrobials:  Anti-infectives (From admission, onward)    Start     Dose/Rate Route Frequency Ordered Stop   04/17/22 1600  ceFAZolin (ANCEF) IVPB 2g/100 mL premix        2 g 200 mL/hr over 30 Minutes Intravenous Every 8 hours 04/17/22 1304 04/18/22 0100         Subjective: Laying  in bed.  No chest pain.  No shortness of breath.  No abdominal pain.  Tolerating current diet.  Some improvement with right hip pain however tender to palpation.  Stated saw orthopedic doctor today dressing to be changed later on today.    Objective: Vitals:   04/19/22 1259 04/19/22 1749 04/19/22 2144 04/20/22 0514  BP: (!) 125/59 119/63 120/63 125/66  Pulse: 94 95 85 76  Resp: 16 17 17 17  $ Temp: 98.2 F (36.8 C) 98 F (36.7 C) 98.7 F (37.1 C) 98.4 F (36.9 C)  TempSrc: Oral  Oral Oral  SpO2: 100% 100% 100% 99%  Weight:      Height:        Intake/Output Summary (Last 24 hours) at 04/20/2022 1021 Last data filed at 04/20/2022 0332 Gross per 24 hour  Intake 360 ml  Output 700 ml  Net -340 ml    Filed Weights   04/17/22 1318  Weight: 94 kg    Examination:  General exam: NAD. Respiratory system: Lungs clear to auscultation bilaterally.  No wheezes, no crackles, no rhonchi.  Fair air movement.  Speaking in full sentences.  Cardiovascular system: RRR no murmurs rubs or gallops.  No JVD.  No lower extremity edema.   Gastrointestinal system: Abdomen is soft, nontender, nondistended, positive bowel sounds.  No rebound.  No guarding.   Central nervous system: Alert, oriented.  Moving extremities spontaneously.  No focal neurological deficits.   Extremities: Dressing on right hip somewhat soaked with some serosanguineous fluid.  Tender to palpation.   Skin: No rashes, lesions or ulcers Psychiatry: Judgement and insight appear normal. Mood & affect appropriate.  Data Reviewed: I have personally reviewed following labs and imaging studies  CBC: Recent Labs  Lab 04/13/22 1101 04/14/22 0439 04/16/22 0502 04/17/22 0423 04/18/22 0306 04/18/22 1346 04/19/22 0844 04/20/22 0327  WBC 8.3   < > 6.4 7.5 9.2  --  8.3 6.4  NEUTROABS 6.9  --  4.4 5.1 7.6  --   --   --   HGB 9.1*   < > 8.5* 9.9* 8.0* 7.2* 9.2* 7.9*  HCT 27.9*   < > 26.0* 30.2* 25.1* 22.3* 28.1* 23.8*  MCV 93.3    < > 94.2 91.5 94.7  --  92.7 92.2  PLT 254   < > 209 208 161  --  119* 116*   < > = values in this interval not displayed.     Basic Metabolic Panel: Recent Labs  Lab 04/15/22 0420 04/16/22 0502 04/17/22 0423 04/18/22 0306 04/19/22 0844 04/20/22 0327  NA 132* 135 135 132* 135 135  K 4.6 4.6 4.7 5.1 4.0 3.9  CL 103 105 105 104 108 109  CO2 22 24 23 $ 19* 19* 21*  GLUCOSE 81 81 90 148* 178* 88  BUN 36* 29* 31* 37* 38* 34*  CREATININE 1.56* 1.45* 1.49* 1.97* 1.60* 1.40*  CALCIUM 8.7* 8.7* 8.8* 8.2* 7.7* 7.6*  MG 2.2  --   --   --   --   --   PHOS  --  3.2  --   --   --  2.1*     GFR: Estimated Creatinine Clearance: 48 mL/min (A) (by C-G formula based on SCr of 1.4 mg/dL (H)).  Liver Function Tests: Recent Labs  Lab 04/16/22 0502 04/17/22 0423 04/20/22 0327  AST  --  30  --   ALT  --  24  --   ALKPHOS  --  142*  --   BILITOT  --  1.0  --   PROT  --  6.8  --   ALBUMIN 2.9* 2.9* 2.3*     CBG: No results for input(s): "GLUCAP" in the last 168 hours.   Recent Results (from the past 240 hour(s))  Surgical pcr screen     Status: None   Collection Time: 04/13/22  8:59 PM   Specimen: Nasal Mucosa; Nasal Swab  Result Value Ref Range Status   MRSA, PCR NEGATIVE NEGATIVE Final   Staphylococcus aureus NEGATIVE NEGATIVE Final    Comment: (NOTE) The Xpert SA Assay (FDA approved for NASAL specimens in patients 72 years of age and older), is one component of a comprehensive surveillance program. It is not intended to diagnose infection nor to guide or monitor treatment. Performed at [facility], [place] [address] [place], [place] [address]   Surgical pcr screen     Status: None   Collection Time: 04/17/22  6:41 AM   Specimen: Nasal Mucosa; Nasal Swab  Result Value Ref Range Status   MRSA, PCR NEGATIVE NEGATIVE Final   Staphylococcus aureus NEGATIVE NEGATIVE Final    Comment: (NOTE) The Xpert SA Assay (FDA approved for NASAL specimens in patients  53 years of age and older), is one component of a comprehensive surveillance program. It is not intended to diagnose infection nor to guide or monitor treatment. Performed at [facility], [place] [address] [place], [place] [address]          Radiology Studies: No results found.      Scheduled Meds:  acetaminophen  1,000 mg Oral TID   amLODipine  10 mg Oral Daily   atorvastatin  40 mg Oral QPM   calcium carbonate  1 tablet Oral Q breakfast   doxazosin  2 mg Oral QPM   enoxaparin (LOVENOX) injection  40 mg Subcutaneous Q24H   feeding supplement  237 mL Oral Q24H   ferrous sulfate  325 mg Oral BID   multivitamins with iron  1 tablet Oral Daily   senna-docusate  1 tablet Oral BID   sodium chloride flush  3 mL Intravenous Q12H   Vitamin D (Ergocalciferol)  50,000 Units Oral Weekly   Continuous Infusions:    LOS: 7 days    Time spent: 40 minutes    Irine Seal, MD [facility]   To contact the attending provider between 7A-7P or the covering provider during after hours 7P-7A, please log into the web site [email or web address] and access using universal [place] password for that web site. If you do not have the password, please call the hospital operator.  04/20/2022, 10:21 AM

## 2022-04-20 NOTE — Progress Notes (Signed)
Physical Therapy Treatment Patient Details Name: Frank Hernandez MRN: [id number] DOB: February 04, 1941 Today's Date: 04/20/2022   History of Present Illness Patient is a 82 year old male with recent hospitalization for R knee pain1/29/2024-04/05/2022 whereby he underwent aspiration and steroid injection felt secondary to osteoarthritis, d/c to SNF. Pt presented  back to [facility] with incr RLE pain. xray right/pelvis showed subtrochanteric fracture of the right femur, right knee with decreased soft tissue swelling no sizable residual  joint effusion, no acute osseous abnormality, severe medial compartment osteoarthrosis. CT right hip done with concerns for pathologic fracture, lytic lesions noted.  orthopedics consulted, ordered a CT chest abdomen and pelvis; pt underwent IM nail R femur d/t pathological fx +Bx on 04/17/22  -Hematology/oncology consultation pending. PMH: hypertension hyperlipidemia CKD stage IIIa, prediabetes, s/p L intramedullary intertrochanteric nailing for hip fracture 06/28/2020    PT Comments    Pt is progressing toward goals. Cooperative and motivated.  Requiring less assist overall this session. Will benefit from SNF post acute   Recommendations for follow up therapy are one component of a multi-disciplinary discharge planning process, led by the attending physician.  Recommendations may be updated based on patient status, additional functional criteria and insurance authorization.  Follow Up Recommendations  Skilled nursing-short term rehab (<3 hours/day) Can patient physically be transported by private vehicle: No   Assistance Recommended at Discharge Frequent or constant Supervision/Assistance  Patient can return home with the following A lot of help with walking and/or transfers;A lot of help with bathing/dressing/bathroom;Assist for transportation;Help with stairs or ramp for entrance   Equipment Recommendations  None recommended by PT    Recommendations for Other Services        Precautions / Restrictions Precautions Precautions: Fall Precaution Comments: denies falls in past 6 months Restrictions Weight Bearing Restrictions: No RLE Weight Bearing: Weight bearing as tolerated     Mobility  Bed Mobility Overal bed mobility: Needs Assistance Bed Mobility: Supine to Sit, Sit to Supine     Supine to sit: Min assist Sit to supine: Min assist, Mod assist   General bed mobility comments: verbal cues for sequence, increased time and physical A to progress RLE off bed  to transition from supine to sit. min assist to lift RLE on to bed and cues  to rotate trunk; able to scoot laterally in supine with cues    Transfers Overall transfer level: Needs assistance Equipment used: Rolling walker (2 wheels) Transfers: Sit to/from Stand Sit to Stand: Mod assist, Min assist           General transfer comment: STSx2 .assist to rise and transition to RW. cues for LE position and hand placement    Ambulation/Gait               General Gait Details: lateral steps alone EOB, wt shifting limited by pain   Stairs             Wheelchair Mobility    Modified Rankin (Stroke Patients Only)       Balance   Sitting-balance support: No upper extremity supported, Feet supported Sitting balance-Leahy Scale: Fair     Standing balance support: Bilateral upper extremity supported, Reliant on assistive device for balance Standing balance-Leahy Scale: Poor Standing balance comment: reliant on UEs and external assist, nearing supervision for static stand                            Cognition Arousal/Alertness: Awake/alert Behavior During Therapy: [facility]  for tasks assessed/performed Overall Cognitive Status: Within Functional Limits for tasks assessed                                 General Comments: patient is plesant and cooperative, patients wife was present in room as well.        Exercises General Exercises - Lower  Extremity Ankle Circles/Pumps: AROM, 5 reps, Both Heel Slides: AAROM, Right, 10 reps    General Comments        Pertinent Vitals/Pain Pain Assessment Pain Assessment: 0-10 Pain Score: 6  Pain Location: right hip Pain Descriptors / Indicators: Discomfort, Grimacing, Moaning Pain Intervention(s): Limited activity within patient's tolerance, Monitored during session, Premedicated before session, Repositioned    Home Living                          Prior Function            PT Goals (current goals can now be found in the care plan section) Acute Rehab PT Goals Patient Stated Goal: return to walking PT Goal Formulation: With patient Time For Goal Achievement: 05/03/22 Potential to Achieve Goals: Fair Progress towards PT goals: Progressing toward goals    Frequency    Min 3X/week      PT Plan Current plan remains appropriate    Co-evaluation              AM-PAC PT "6 Clicks" Mobility   Outcome Measure  Help needed turning from your back to your side while in a flat bed without using bedrails?: A Lot Help needed moving from lying on your back to sitting on the side of a flat bed without using bedrails?: A Lot Help needed moving to and from a bed to a chair (including a wheelchair)?: A Lot Help needed standing up from a chair using your arms (e.g., wheelchair or bedside chair)?: A Lot Help needed to walk in hospital room?: Total Help needed climbing 3-5 steps with a railing? : Total 6 Click Score: 10    End of Session Equipment Utilized During Treatment: Gait belt Activity Tolerance: Patient tolerated treatment well;Patient limited by pain Patient left: in bed;with call bell/phone within reach;with bed alarm set   PT Visit Diagnosis: Difficulty in walking, not elsewhere classified (R26.2);Pain Pain - Right/Left: Right Pain - part of body: Hip     Time: 1413-1440 PT Time Calculation (min) (ACUTE ONLY): 27 min  Charges:  $Therapeutic Activity:  23-37 mins                     Baxter Flattery, PT  Acute Rehab Dept [facility]) [phone]  [facility] Weekend Pager [facility] only)  [phone]  04/20/2022    [facility] 04/20/2022, 5:01 PM

## 2022-04-20 NOTE — Progress Notes (Signed)
Orthopedic Surgery Progress Note   Assessment: Patient is a 82 y.o. male with right pertrochanteric femur fracture s/p biopsy and CMN   Plan: -Operative plans: complete -Follow up intra-op pathology -Regular diet -DVT ppx: per primary -Weight bearing status: WBAT RLE -PT evaluate and treat -Pain control -Dispo: per primary  ___________________________________________________________________________  Subjective: No acute events overnight. Remains on the floor. No pain when in bed but has significant pain when trying to mobilize. Pain medications are helping with that pain. No pain in any other extremity when trying to mobilize. Denies paresthesias and numbness.   Physical Exam:  General: no acute distress, appears stated age Neurologic: alert, answering questions appropriately, following commands Respiratory: unlabored breathing on room air, symmetric chest rise Psychiatric: appropriate affect, normal cadence to speech Skin: proximal dressings with serosanguinous drainage, distal dressing dry, dressings intact  MSK:   -Right lower extremity Fires quadriceps, hamstrings, tibialis anterior, gastrocnemius and soleus, extensor hallucis longus Does not fire hip flexors due to pain Plantarflexes and dorsiflexes toes Sensation intact to light touch in sural, saphenous, tibial, deep peroneal, and superficial peroneal nerve distributions Foot warm and well perfused   Patient name: Frank Hernandez Patient MRN: [id number] Date: 04/20/22

## 2022-04-20 NOTE — TOC Progression Note (Addendum)
Transition of Care [facility]) - Progression Note    Patient Details  Name: Frank Hernandez MRN: [id number] Date of Birth: 1941-02-22  Transition of Care [facility] CM/SW Contact  Lennart Pall, LCSW Phone Number: 04/20/2022, 10:25 AM  Clinical Narrative:     Spoke with pt and wife today and confirming they are aware therapy recommends return to SNF rehab and they are in agreement.  Pt and wife would like to return to [facility] who have confirmed SNF bed ready.  Have begun insurance authorization.  49 Addendum: Have received insurance authorization [id number]), however, pt not yet medically cleared - hopeful will be cleared for dc by tomorrow.  Expected Discharge Plan: [place] Barriers to Discharge: Continued Medical Work up  Expected Discharge Plan and Services       Living arrangements for the past 2 months: Single Family Home                                       Social Determinants of Health (SDOH) Interventions SDOH Screenings   Food Insecurity: Unknown (04/17/2022)  Housing: Low Risk  (04/17/2022)  Transportation Needs: Unknown (04/17/2022)  Utilities: Unknown (04/17/2022)  Tobacco Use: Low Risk  (04/19/2022)    Readmission Risk Interventions    04/14/2022    1:48 PM 04/02/2022   11:43 AM  Readmission Risk Prevention Plan  Post Dischage Appt  Complete  Medication Screening  Complete  Transportation Screening Complete Complete  PCP or Specialist Appt within 5-7 Days Complete   Home Care Screening Complete   Medication Review (RN CM) Complete

## 2022-04-20 NOTE — NC FL2 (Signed)
[place] LEVEL OF CARE FORM     IDENTIFICATION  Patient Name: Frank Hernandez Birthdate: 08-01-1940 Sex: male Admission Date (Current Location): 04/13/2022  [facility] and [place] Number:  Herbalist and Address:  [facility],  [place], [place]      Provider Number: [id number]  Attending Physician Name and Address:  Eugenie Filler, MD  Relative Name and Phone Number:  spouse, Clayson Deubel [phone]    Current Level of Care: Hospital Recommended Level of Care: [place] Prior Approval Number:    Date Approved/Denied:   PASRR Number: [id number]  Discharge Plan: SNF    Current Diagnoses: Patient Active Problem List   Diagnosis Date Noted   Vitamin D deficiency 04/19/2022   Malignant neoplasm of bone with metastases ([place]) 04/15/2022   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 ([place]) 04/15/2022   Closed displaced subtrochanteric fracture of right femur ([place]) 04/13/2022   Normocytic anemia 04/13/2022   Chronic kidney disease, stage 3a ([place]) 04/03/2022   Decreased oral intake 04/02/2022   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   Essential hypertension 04/01/2022   Pain in joint, lower leg 04/01/2022   Effusion of right knee 03/30/2022   Pressure injury of skin 03/30/2022   Effusion, right knee 03/30/2022   Concussion with unknown loss of consciousness status 12/24/2020   Hypokalemia    Rib fractures 06/10/2020   Severe essential hypertension 11/15/2018   Syncope 12/26/2015   Pre-diabetes 12/26/2015    Orientation RESPIRATION BLADDER Height & Weight     Self, Time, Situation, Place  Normal Continent, External catheter Weight: 207 lb 3.7 oz (94 kg) Height:  5' 9.5" (176.5 cm)  BEHAVIORAL SYMPTOMS/MOOD NEUROLOGICAL BOWEL NUTRITION STATUS      Continent  (regular)  AMBULATORY STATUS COMMUNICATION OF NEEDS Skin   Limited Assist Verbally PU Stage and Appropriate Care,  Other (Comment) (surgical incision) PU Stage 1 Dressing:  (foam dressing)                     Personal Care Assistance Level of Assistance  Bathing, Dressing Bathing Assistance: Limited assistance   Dressing Assistance: Limited assistance     Functional Limitations Info  Sight, Hearing, Speech Sight Info: Impaired Hearing Info: Adequate Speech Info: Adequate    SPECIAL CARE FACTORS FREQUENCY  PT (By licensed PT), OT (By licensed OT)     PT Frequency: 5x/wk OT Frequency: 5x/wk            Contractures Contractures Info: Not present    Additional Factors Info  Code Status, Allergies Code Status Info: Full Allergies Info: NKDA           Current Medications (04/20/2022):  This is the current hospital active medication list Current Facility-Administered Medications  Medication Dose Route Frequency Provider Last Rate Last Admin   acetaminophen (TYLENOL) tablet 1,000 mg  1,000 mg Oral TID Callie Fielding, MD   1,000 mg at 04/20/22 0854   albuterol (PROVENTIL) (2.5 MG/3ML) 0.083% nebulizer solution 2.5 mg  2.5 mg Nebulization Q2H PRN Callie Fielding, MD       amLODipine (NORVASC) tablet 10 mg  10 mg Oral Daily Callie Fielding, MD   10 mg at 04/20/22 0854   atorvastatin (LIPITOR) tablet 40 mg  40 mg Oral QPM Callie Fielding, MD   40 mg at 04/19/22 1818   calcium carbonate (OS-CAL - dosed in mg of elemental calcium) tablet 1,250 mg  1 tablet Oral Q breakfast Callie Fielding, MD   1,250 mg at 04/20/22 [id number]   doxazosin (CARDURA) tablet 2 mg  2 mg Oral QPM Callie Fielding, MD   2 mg at 04/19/22 1818   enoxaparin (LOVENOX) injection 40 mg  40 mg Subcutaneous Q24H Callie Fielding, MD   40 mg at 04/19/22 2125   feeding supplement (ENSURE ENLIVE / ENSURE PLUS) liquid 237 mL  237 mL Oral Q24H Callie Fielding, MD   237 mL at 04/19/22 1645   ferrous sulfate tablet 325 mg  325 mg Oral BID Callie Fielding, MD   325 mg at 04/20/22 [id number]   methocarbamol (ROBAXIN) tablet 500 mg   500 mg Oral Q6H PRN Callie Fielding, MD   500 mg at 04/16/22 2234   morphine (PF) 2 MG/ML injection 2-4 mg  2-4 mg Intravenous Q4H PRN Callie Fielding, MD   2 mg at 04/18/22 1824   multivitamins with iron tablet 1 tablet  1 tablet Oral Daily Callie Fielding, MD   1 tablet at 04/20/22 0854   ondansetron (ZOFRAN) injection 4 mg  4 mg Intravenous Q6H PRN Callie Fielding, MD   4 mg at 04/18/22 1823   senna-docusate (Senokot-S) tablet 1 tablet  1 tablet Oral BID Callie Fielding, MD   1 tablet at 04/20/22 0853   sodium chloride flush (NS) 0.9 % injection 3 mL  3 mL Intravenous Q12H Callie Fielding, MD   3 mL at 04/19/22 2127   traMADol (ULTRAM) tablet 50 mg  50 mg Oral Q6H PRN Callie Fielding, MD   50 mg at 04/20/22 [id number]   Vitamin D (Ergocalciferol) (DRISDOL) 1.25 MG (50000 UNIT) capsule 50,000 Units  50,000 Units Oral Weekly Eugenie Filler, MD   50,000 Units at 04/19/22 1322     Discharge Medications: Please see discharge summary for a list of discharge medications.  Relevant Imaging Results:  Relevant Lab Results:   Additional Information SS# [id number]  Lennart Pall, LCSW

## 2022-04-21 DIAGNOSIS — S7221XP Displaced subtrochanteric fracture of right femur, subsequent encounter for closed fracture with malunion: Secondary | ICD-10-CM | POA: Diagnosis not present

## 2022-04-21 DIAGNOSIS — C9000 Multiple myeloma not having achieved remission: Secondary | ICD-10-CM

## 2022-04-21 DIAGNOSIS — N1831 Chronic kidney disease, stage 3a: Secondary | ICD-10-CM | POA: Diagnosis not present

## 2022-04-21 DIAGNOSIS — D649 Anemia, unspecified: Secondary | ICD-10-CM | POA: Diagnosis not present

## 2022-04-21 DIAGNOSIS — M84551P Pathological fracture in neoplastic disease, right femur, subsequent encounter for fracture with malunion: Secondary | ICD-10-CM | POA: Diagnosis not present

## 2022-04-21 HISTORY — DX: Multiple myeloma not having achieved remission: C90.00

## 2022-04-21 LAB — RENAL FUNCTION PANEL
Albumin: 2.5 g/dL — ABNORMAL LOW (ref 3.5–5.0)
Anion gap: 5 (ref 5–15)
BUN: 29 mg/dL — ABNORMAL HIGH (ref 8–23)
CO2: 22 mmol/L (ref 22–32)
Calcium: 8.4 mg/dL — ABNORMAL LOW (ref 8.9–10.3)
Chloride: 106 mmol/L (ref 98–111)
Creatinine, Ser: 1.29 mg/dL — ABNORMAL HIGH (ref 0.61–1.24)
GFR, Estimated: 56 mL/min — ABNORMAL LOW (ref >60)
Glucose, Bld: 85 mg/dL (ref 70–99)
Phosphorus: 2.2 mg/dL — ABNORMAL LOW (ref 2.5–4.6)
Potassium: 4.2 mmol/L (ref 3.5–5.1)
Sodium: 133 mmol/L — ABNORMAL LOW (ref 135–145)

## 2022-04-21 LAB — CBC
HCT: 26.2 % — ABNORMAL LOW (ref 39.0–52.0)
Hemoglobin: 8.8 g/dL — ABNORMAL LOW (ref 13.0–17.0)
MCH: 30.7 pg (ref 26.0–34.0)
MCHC: 33.6 g/dL (ref 30.0–36.0)
MCV: 91.3 fL (ref 80.0–100.0)
Platelets: 144 10*3/uL — ABNORMAL LOW (ref 150–400)
RBC: 2.87 MIL/uL — ABNORMAL LOW (ref 4.22–5.81)
RDW: 14.8 % (ref 11.5–15.5)
WBC: 6.4 10*3/uL (ref 4.0–10.5)
nRBC: 0 % (ref 0.0–0.2)

## 2022-04-21 LAB — SURGICAL PATHOLOGY

## 2022-04-21 MED ORDER — K PHOS MONO-SOD PHOS DI & MONO 155-852-130 MG PO TABS
500.0000 mg | ORAL_TABLET | Freq: Three times a day (TID) | ORAL | Status: AC
  Administered 2022-04-21 – 2022-04-22 (×4): 500 mg via ORAL
  Filled 2022-04-21 (×4): qty 2

## 2022-04-21 MED ORDER — LOPERAMIDE HCL 2 MG PO CAPS
2.0000 mg | ORAL_CAPSULE | Freq: Three times a day (TID) | ORAL | Status: DC | PRN
  Administered 2022-04-21: 2 mg via ORAL
  Filled 2022-04-21: qty 1

## 2022-04-21 NOTE — Progress Notes (Signed)
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 Also concern for AL amyloidosis based on the biopsy. PLan -Need urgent CT bone marrow aspiration and biopsy.  This has been ordered and hopefully can be done tomorrow prior to discharge to SNF. -Echo to evaluate for cardiac amyloidosis -UPEP did not show any significant proteinuria with low likelihood of renal amyloidosis. -We will set up outpatient PET CT scan and clinic follow-up and treatment plans as outpatient with me/Dr Irene Limbo -Full note to follow -Anticipate patient should be able to discharge to SNF after bone marrow biopsy and echo tomorrow.  Sullivan Lone MD MS

## 2022-04-21 NOTE — TOC Progression Note (Signed)
Transition of Care [facility]) - Progression Note    Patient Details  Name: Frank Hernandez MRN: [id number] Date of Birth: 12-23-1940  Transition [facility]) CM/SW Contact  Lennart Pall, LCSW Phone Number: 04/21/2022, 2:27 PM  Clinical Narrative:    Pt and wife aware that we are awaiting medical clearance for dc.  Have alerted [facility] admissions as well.     Expected Discharge Plan: [place] Barriers to Discharge: Continued Medical Work up  Expected Discharge Plan and Services       Living arrangements for the past 2 months: Single Family Home                                       Social Determinants of Health (SDOH) Interventions SDOH Screenings   Food Insecurity: Unknown (04/17/2022)  Housing: Low Risk  (04/17/2022)  Transportation Needs: Unknown (04/17/2022)  Utilities: Unknown (04/17/2022)  Tobacco Use: Low Risk  (04/19/2022)    Readmission Risk Interventions    04/14/2022    1:48 PM 04/02/2022   11:43 AM  Readmission Risk Prevention Plan  Post Dischage Appt  Complete  Medication Screening  Complete  Transportation Screening Complete Complete  PCP or Specialist Appt within 5-7 Days Complete   Home Care Screening Complete   Medication Review (RN CM) Complete

## 2022-04-21 NOTE — Progress Notes (Signed)
PROGRESS NOTE  Frank Hernandez  [id number] DOB: [birth year] DOA: 04/13/2022 PCP: [name] (Inactive)   Brief Narrative: Patient is a 82 year old male with history of hypertension, hyperlipidemia, CKD stage III, prediabetes, recent left hip fracture status post intramedullary intertrochanteric nailing on 06/28/2020, recent hospitalization for nontraumatic right knee pain/effusion on 1/20  status post aspiration and steroid injection felt to be secondary to osteoarthritis and was sent to SNF presented back with right hip pain.  Plain films of the right hip and pelvis showed subtrochanteric fracture of the right femur.  CT right hip was concerning for pathological right peritrochanteric femur fracture , lytic lesion.  Orthopedics consulted, status post right intramedullary nailing and biopsy of right proximal femur.  Biopsy pending, oncology following for concern of multiple myeloma.  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  No recent history of fall or trauma.  Spontaneous fracture.  CT of right hip showed severe displaced fracture involving the intertrochanteric region of the proximal right femur, lucency noted in the right femoral neck suggesting possibility of lytic lesion and pathological fracture.  Cortical defect in the right inferior pubic ramus suggesting possibility of metastatic lesion.CT chest abdomen and pelvis with subacute displaced right hip  fracture, lytic destructive bone disease involving right inferior pubic ramus, left acetabulum, posterior aspect of L2 vertebral body, could not exclude lytic lesions involving T9 and T10 vertebral bodies, other lytic lesions could be difficult to identify because of underlying osteoporosis.  No obvious primary neoplastic process identified. No acute abdominal/pelvic findings, mass lesions or adenopathy.  SPEP/UPEP... PSA elevated at 8.5.  Kappa free chain elevated at 120. Orthopedics consulted, status post  right intramedullary nailing and biopsy of right proximal femur.  Biopsy pending, oncology following for concern of multiple myeloma.  Hypophosphatemia: Currently being supplemented  Hypertension: On Norvasc, Cardura  AKI on CKD: Stable.  Hyperlipidemia: On statin  Normocytic anemia: Likely history with malignancy.  He is status post 3 units of blood transfusion during this hospitalization.  Hemoglobin in the range of 8.  Has mild thrombocytopenia.  On oral iron supplementation  Prediabetes: A1c of 6.  Outpatient follow-up recommended  Vitamin D deficiency: Continue high-dose vitamin D supplementation for 6 to 8 weeks followed by low-dose daily supplementation  Debility/deconditioning: PT/OT recommending SNF on discharge  Obesity: BMI of 30    Pressure Injury 03/29/22 Buttocks Right Stage 1 -  Intact skin with non-blanchable redness of a localized area usually over a bony prominence. (Active)  03/29/22 2140  Location: Buttocks  Location Orientation: Right  Staging: Stage 1 -  Intact skin with non-blanchable redness of a localized area usually over a bony prominence.  Wound Description (Comments):   Present on Admission: Yes  Dressing Type Foam - Lift dressing to assess site every shift 04/21/22 0730    DVT prophylaxis:enoxaparin (LOVENOX) injection 40 mg Start: 04/13/22 2200     Code Status: Full Code  Family Communication: Wife on phone on 2/21  Patient status:  Inpatient  Patient  is from :SNF  Anticipated discharge to:SNF  Estimated DC date:1-2 days, oncology recommending to wait for biopsy report for management plan   Consultants: Oncology, orthopedics  Procedures: Intramedullary nailing  Antimicrobials:  Anti-infectives (From admission, onward)    Start     Dose/Rate Route Frequency Ordered Stop   04/17/22 1600  ceFAZolin (ANCEF) IVPB 2g/100 mL premix        2 g 200 mL/hr over 30 Minutes Intravenous Every 8 hours 04/17/22 1304 04/18/22 0100       Subjective: Patient seen and examined at bedside today.  Hemodynamically stable.  Comfortable, lying in bed denies any pain or discomfort  Objective: Vitals:   04/20/22 1301 04/20/22 2145 04/21/22 0625 04/21/22 1238  BP: (!) 111/59 116/64 125/76 112/63  Pulse: 65 60 76 82  Resp: 16 17 17 16  $ Temp: 98.4 F (36.9 C) 98.6 F (37 C) 98.2 F (36.8 C) 98.4 F (36.9 C)  TempSrc: Oral Oral Oral   SpO2: 100% 100% 100% 100%  Weight:      Height:        Intake/Output Summary (Last 24 hours) at 04/21/2022 1343 Last data filed at 04/21/2022 1209 Gross per 24 hour  Intake 600 ml  Output 701 ml  Net -101 ml   Filed Weights   04/17/22 1318  Weight: 94 kg    Examination:  General exam: Overall comfortable, not in distress, obese, pleasant elderly gentleman HEENT: PERRL Respiratory system:  no wheezes or crackles  Cardiovascular system: S1 & S2 heard, RRR.  Gastrointestinal system: Abdomen is nondistended, soft and nontender. Central nervous system: Alert and oriented Extremities: No edema, no clubbing ,no cyanosis Skin: No rashes, no ulcers,no icterus     Data Reviewed: I have personally reviewed following labs and imaging studies  CBC: Recent Labs  Lab 04/16/22 0502 04/17/22 0423 04/18/22 0306 04/18/22 1346 04/19/22 0844 04/20/22 0327 04/21/22 0316  WBC 6.4 7.5 9.2  --  8.3 6.4 6.4  NEUTROABS 4.4 5.1 7.6  --   --   --   --   HGB 8.5* 9.9* 8.0* 7.2* 9.2* 7.9* 8.8*   HCT 26.0* 30.2* 25.1* 22.3* 28.1* 23.8* 26.2*  MCV 94.2 91.5 94.7  --  92.7 92.2 91.3  PLT 209 208 161  --  119* 116* [id number]*   Basic Metabolic Panel: Recent Labs  Lab 04/15/22 0420 04/16/22 0502 04/17/22 0423 04/18/22 0306 04/19/22 0844 04/20/22 0327 04/21/22 0316  NA 132* 135 135 132* 135 135 133*  K 4.6 4.6 4.7 5.1 4.0 3.9 4.2  CL 103 105 105 104 108 109 106  CO2 22 24 23 $ 19* 19* 21* 22  GLUCOSE 81 81 90 148* 178* 88 85  BUN 36* 29* 31* 37* 38* 34* 29*  CREATININE 1.56* 1.45* 1.49* 1.97* 1.60* 1.40* 1.29*  CALCIUM 8.7* 8.7* 8.8* 8.2* 7.7* 7.6* 8.4*  MG 2.2  --   --   --   --   --   --   PHOS  --  3.2  --   --   --  2.1* 2.2*     Recent Results (from the past 240 hour(s))  Surgical pcr screen     Status: None   Collection Time: 04/13/22  8:59 PM   Specimen: Nasal Mucosa; Nasal Swab  Result Value Ref Range Status   MRSA, PCR NEGATIVE NEGATIVE Final   Staphylococcus aureus NEGATIVE NEGATIVE Final    Comment: (NOTE) The Xpert SA Assay (FDA approved for NASAL specimens in patients  81 years of age and older), is one component of a comprehensive surveillance program. It is not intended to diagnose infection nor to guide or monitor treatment. Performed at [facility], [place] [address], [place], [place] [address]   Surgical pcr screen     Status: None   Collection Time: 04/17/22  6:41 AM   Specimen: Nasal Mucosa; Nasal Swab  Result Value Ref Range Status   MRSA, PCR NEGATIVE NEGATIVE Final   Staphylococcus aureus NEGATIVE NEGATIVE Final    Comment: (NOTE) The Xpert SA Assay (FDA approved for NASAL specimens in patients 85 years of age and older), is one component of a comprehensive surveillance program. It is not intended to diagnose infection nor to guide or monitor treatment. Performed at [facility], [place] [address] [place], [place] [address]      Radiology Studies: No results found.  Scheduled Meds:  acetaminophen  1,000  mg Oral TID   amLODipine  10 mg Oral Daily   atorvastatin  40 mg Oral QPM   calcium carbonate  1 tablet Oral Q breakfast   doxazosin  2 mg Oral QPM   enoxaparin (LOVENOX) injection  40 mg Subcutaneous Q24H   feeding supplement  237 mL Oral Q24H   ferrous sulfate  325 mg Oral BID   multivitamins with iron  1 tablet Oral Daily   phosphorus  500 mg Oral TID   senna-docusate  1 tablet Oral BID   sodium chloride flush  3 mL Intravenous Q12H   Vitamin D (Ergocalciferol)  50,000 Units Oral Weekly   Continuous Infusions:   LOS: 8 days   Shelly Coss, MD [facility] P2/21/2024, 1:43 PM  PROGRESS NOTE  LISLE SAFRANSKI  [id number] DOB: 1940-10-04 DOA: 04/13/2022 PCP: [name] (Inactive)   Brief Narrative:   Assessment & Plan:  Principal Problem:   Closed displaced subtrochanteric fracture of right femur ([place]) Active Problems:   Essential hypertension   Pre-diabetes   Chronic kidney disease, stage 3a ([place])   Normocytic anemia   Malignant neoplasm of bone with metastases ([place])   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)   Vitamin D deficiency        Pressure Injury 03/29/22 Buttocks Right Stage 1 -  Intact skin with non-blanchable redness of a localized area usually over a bony prominence. (Active)  03/29/22 2140  Location: Buttocks  Location Orientation: Right  Staging: Stage 1 -  Intact skin with non-blanchable redness of a localized area usually over a bony prominence.  Wound Description (Comments):   Present on Admission: Yes  Dressing Type Foam - Lift dressing to assess site every shift 04/21/22 0730    DVT prophylaxis:enoxaparin (LOVENOX) injection 40 mg Start: 04/13/22 2200     Code Status: Full Code  Family Communication:   Patient status:  Patient is from :  Anticipated discharge to:  Estimated DC date:   Consultants:   Procedures:  Antimicrobials:  Anti-infectives (From admission, onward)    Start      Dose/Rate Route Frequency Ordered Stop   04/17/22 1600  ceFAZolin (ANCEF) IVPB 2g/100 mL premix        2 g 200 mL/hr over 30 Minutes Intravenous Every 8 hours 04/17/22 1304 04/18/22 0100       Subjective:   Objective: Vitals:   04/20/22 1301 04/20/22 2145 04/21/22 0625 04/21/22 1238  BP: (!) 111/59 116/64 125/76 112/63  Pulse: 65 60 76 82  Resp: 16 17 17 $ 16  Temp: 98.4 F (36.9 C) 98.6 F (37 C) 98.2 F (36.8 C) 98.4 F (36.9 C)  TempSrc: Oral Oral Oral   SpO2: 100% 100% 100% 100%  Weight:      Height:        Intake/Output Summary (Last 24 hours) at 04/21/2022 1343 Last data filed at 04/21/2022 1209 Gross per 24 hour  Intake 600 ml  Output 701 ml  Net -101 ml   Filed Weights   04/17/22 1318  Weight: 94 kg    Examination:  General exam: Overall comfortable, not in distress HEENT: PERRL Respiratory system:  no wheezes or crackles  Cardiovascular system: S1 & S2 heard, RRR.  Gastrointestinal system: Abdomen is nondistended, soft and nontender. Central nervous system: Alert and oriented Extremities: No edema, no clubbing ,no cyanosis Skin: No rashes, no ulcers,no icterus     Data Reviewed: I have personally reviewed following labs and imaging studies  CBC: Recent Labs  Lab 04/16/22 0502 04/17/22 0423 04/18/22 0306 04/18/22 1346 04/19/22 0844 04/20/22 0327 04/21/22 0316  WBC 6.4 7.5 9.2  --  8.3 6.4 6.4  NEUTROABS 4.4 5.1 7.6  --   --   --   --   HGB 8.5* 9.9* 8.0* 7.2* 9.2* 7.9* 8.8*  HCT 26.0* 30.2* 25.1* 22.3* 28.1* 23.8* 26.2*  MCV 94.2 91.5 94.7  --  92.7 92.2 91.3  PLT 209 208 161  --  119* 116* 123456*   Basic Metabolic Panel: Recent Labs  Lab 04/15/22 0420 04/16/22 0502 04/17/22 0423 04/18/22 0306 04/19/22 0844 04/20/22 0327 04/21/22 0316  NA 132* 135 135 132* 135 135 133*  K 4.6 4.6 4.7 5.1 4.0 3.9 4.2  CL 103 105 105 104 108 109 106  CO2 22 24 23 $ 19* 19* 21* 22  GLUCOSE 81 81 90 148* 178* 88 85  BUN 36* 29* 31* 37* 38* 34* 29*   CREATININE 1.56* 1.45* 1.49* 1.97* 1.60* 1.40* 1.29*  CALCIUM 8.7* 8.7* 8.8* 8.2* 7.7* 7.6* 8.4*  MG 2.2  --   --   --   --   --   --   PHOS  --  3.2  --   --   --  2.1* 2.2*     Recent Results (from the past 240 hour(s))  Surgical pcr screen     Status: None   Collection Time: 04/13/22  8:59 PM   Specimen: Nasal Mucosa; Nasal Swab  Result Value Ref Range Status   MRSA, PCR NEGATIVE NEGATIVE Final   Staphylococcus aureus NEGATIVE NEGATIVE Final    Comment: (NOTE) The Xpert SA Assay (FDA approved for NASAL specimens in patients 24 years of age and older), is one component of a comprehensive surveillance program. It is not intended to diagnose infection nor to guide or monitor treatment. Performed at [facility], [place] [address], [place], [place] [address]   Surgical pcr screen     Status: None   Collection Time: 04/17/22  6:41 AM   Specimen: Nasal Mucosa; Nasal Swab  Result Value Ref Range Status   MRSA, PCR NEGATIVE NEGATIVE Final   Staphylococcus aureus NEGATIVE NEGATIVE Final    Comment: (NOTE) The Xpert SA Assay (FDA approved for NASAL specimens in patients 35 years of age and older), is one component of a comprehensive surveillance program. It is not intended to diagnose infection nor to guide or monitor treatment. Performed at [facility], [place] [address] [place], [place] [address]      Radiology  Studies: No results found.  Scheduled Meds:  acetaminophen  1,000 mg Oral TID   amLODipine  10 mg Oral Daily   atorvastatin  40 mg Oral QPM   calcium carbonate  1 tablet Oral Q breakfast   doxazosin  2 mg Oral QPM   enoxaparin (LOVENOX) injection  40 mg Subcutaneous Q24H   feeding supplement  237 mL Oral Q24H   ferrous sulfate  325 mg Oral BID   multivitamins with iron  1 tablet Oral Daily   phosphorus  500 mg Oral TID   senna-docusate  1 tablet Oral BID   sodium chloride flush  3 mL Intravenous Q12H   Vitamin D  (Ergocalciferol)  50,000 Units Oral Weekly   Continuous Infusions:   LOS: 8 days   Shelly Coss, MD [facility] P2/21/2024, 1:43 PM

## 2022-04-21 NOTE — Progress Notes (Signed)
Orthopedic Surgery Progress Note   Assessment: Patient is a 82 y.o. male with right pertrochanteric femur fracture s/p biopsy and CMN   Plan: -Operative plans: complete -Follow up intra-op pathology -Regular diet -DVT ppx: per primary -Weight bearing status: WBAT RLE -PT evaluate and treat -Pain control -Dispo: per primary  ___________________________________________________________________________  Subjective: No acute events overnight. Remains on the floor. States he was able to do more with therapy yesterday. Was surprised with how much he could do. Leg pain is slowly getting better. Medications are still helping with the pain. Denies paresthesias and numbness.   Physical Exam:  General: no acute distress, appears stated age Neurologic: alert, answering questions appropriately, following commands Respiratory: unlabored breathing on room air, symmetric chest rise Psychiatric: appropriate affect, normal cadence to speech Skin: proximal dressings with small amount of serosanguinous drainage, distal dressing dry, dressings intact  MSK:   -Right lower extremity Fires quadriceps, hamstrings, tibialis anterior, gastrocnemius and soleus, extensor hallucis longus Does not fire hip flexors due to pain Plantarflexes and dorsiflexes toes Sensation intact to light touch in sural, saphenous, tibial, deep peroneal, and superficial peroneal nerve distributions Foot warm and well perfused   Patient name: Frank Hernandez Patient MRN: [id number] Date: 04/21/22

## 2022-04-21 NOTE — Progress Notes (Signed)
HEMATOLOGY/ONCOLOGY INPATIENT PROGRESS NOTE  Date of Service: 04/21/2022  Inpatient Attending: .Shelly Coss, MD   SUBJECTIVE  Patient was initially seen by on 04/14/2022 for evaluation and management of multiple lytic bone lesions concerning for metastatic malignancy and pathologic hip fracture. Patient notes he is feeling better than yesterday. Patient notes he has been working with physical therapy. He denies fever, chills, night sweats, back pain, abdominal pain, or leg swelling.   Patients pathology results from surgery on his rt hip confirms diagnosis of Multiple myeloma . 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   OBJECTIVE: NAD  PHYSICAL EXAMINATION: . Vitals:   04/20/22 0514 04/20/22 1301 04/20/22 2145 04/21/22 0625  BP: 125/66 (!) 111/59 116/64 125/76  Pulse: 76 65 60 76  Resp: 17 16 17 17  $ Temp: 98.4 F (36.9 C) 98.4 F (36.9 C) 98.6 F (37 C) 98.2 F (36.8 C)  TempSrc: Oral Oral Oral Oral  SpO2: 99% 100% 100% 100%  Weight:      Height:       Filed Weights   04/17/22 1318  Weight: 94 kg   .Body mass index is 30.16 kg/m.  [name] Kitchen GENERAL:alert, in no acute distress and comfortable SKIN: no acute rashes, no significant lesions EYES: conjunctiva are pink and non-injected, sclera anicteric OROPHARYNX: MMM, no exudates, no oropharyngeal erythema or ulceration NECK: supple, no JVD LYMPH:  no palpable lymphadenopathy in the cervical, axillary or inguinal regions LUNGS: clear to auscultation b/l with normal respiratory effort HEART: regular rate & rhythm ABDOMEN:  normoactive bowel sounds , non tender, not distended. Extremity: no pedal edema PSYCH: alert & oriented x 3 with fluent speech NEURO: no focal motor/sensory deficits   MEDICAL HISTORY:  Past Medical History:  Diagnosis Date   Bleeding from the nose    Hypertension    Pre-diabetes     SURGICAL HISTORY: Past Surgical History:  Procedure Laterality Date   COLONOSCOPY      INTRAMEDULLARY (IM) NAIL INTERTROCHANTERIC Left 06/11/2020   Procedure: INTRAMEDULLARY (IM) NAIL INTERTROCHANTRIC;  Surgeon: Shona Needles, MD;  Location: [facility];  Service: Orthopedics;  Laterality: Left;   INTRAMEDULLARY (IM) NAIL INTERTROCHANTERIC Right 04/17/2022   Procedure: INTRAMEDULLARY (IM) NAIL INTERTROCHANTERIC;  Surgeon: Callie Fielding, MD;  Location: [facility];  Service: Orthopedics;  Laterality: Right;   KNEE ARTHROSCOPY Bilateral    POLYPECTOMY      SOCIAL HISTORY: Social History   Socioeconomic History   Marital status: Single    Spouse name: Not on file   Number of children: Not on file   Years of education: Not on file   Highest education level: Not on file  Occupational History   Not on file  Tobacco Use   Smoking status: Never   Smokeless tobacco: Never  Vaping Use   Vaping Use: Never used  Substance and Sexual Activity   Alcohol use: No    Alcohol/week: 0.0 standard drinks of alcohol   Drug use: No   Sexual activity: Not on file  Other Topics Concern   Not on file  Social History Narrative   Not on file   Social Determinants of Health   Financial Resource Strain: Not on file  Food Insecurity: Unknown (04/17/2022)   Hunger Vital Sign    Worried About Running Out of Food in the Last Year: Patient refused    Ran Out of Food in the Last Year: Patient refused  Transportation Needs: Unknown (04/17/2022)   [place] - Transportation  Lack of Transportation (Medical): Patient refused    Lack of Transportation (Non-Medical): Patient refused  Physical Activity: Not on file  Stress: Not on file  Social Connections: Not on file  Intimate Partner Violence: Unknown (04/17/2022)   Humiliation, Afraid, Rape, and Kick questionnaire    Fear of Current or Ex-Partner: Patient refused    Emotionally Abused: Patient refused    Physically Abused: Patient refused    Sexually Abused: Patient refused    FAMILY HISTORY: Family History  Problem Relation Age of Onset    Colon cancer Neg Hx     ALLERGIES:  has No Known Allergies.  MEDICATIONS:  Scheduled Meds:  acetaminophen  1,000 mg Oral TID   amLODipine  10 mg Oral Daily   atorvastatin  40 mg Oral QPM   calcium carbonate  1 tablet Oral Q breakfast   doxazosin  2 mg Oral QPM   enoxaparin (LOVENOX) injection  40 mg Subcutaneous Q24H   feeding supplement  237 mL Oral Q24H   ferrous sulfate  325 mg Oral BID   multivitamins with iron  1 tablet Oral Daily   phosphorus  500 mg Oral TID   senna-docusate  1 tablet Oral BID   sodium chloride flush  3 mL Intravenous Q12H   Vitamin D (Ergocalciferol)  50,000 Units Oral Weekly   Continuous Infusions: PRN Meds:.albuterol, methocarbamol, morphine injection, ondansetron (ZOFRAN) IV, traMADol  REVIEW OF SYSTEMS:    10 Point review of Systems was done is negative except as noted above.   LABORATORY DATA:  I have reviewed the data as listed .    Latest Ref Rng & Units 04/21/2022    3:16 AM 04/20/2022    3:27 AM 04/19/2022    8:44 AM  CBC  WBC 4.0 - 10.5 K/uL 6.4  6.4  8.3   Hemoglobin 13.0 - 17.0 g/dL 8.8  7.9  9.2   Hematocrit 39.0 - 52.0 % 26.2  23.8  28.1   Platelets 150 - 400 K/uL 144  116  119    .    Latest Ref Rng & Units 04/21/2022    3:16 AM 04/20/2022    3:27 AM 04/19/2022    8:44 AM  CMP  Glucose 70 - 99 mg/dL 85  88  178   BUN 8 - 23 mg/dL 29  34  38   Creatinine 0.61 - 1.24 mg/dL 1.29  1.40  1.60   Sodium 135 - 145 mmol/L 133  135  135   Potassium 3.5 - 5.1 mmol/L 4.2  3.9  4.0   Chloride 98 - 111 mmol/L 106  109  108   CO2 22 - 32 mmol/L 22  21  19   $ Calcium 8.9 - 10.3 mg/dL 8.4  7.6  7.7    Component     Latest Ref Rng 04/14/2022  Total Protein, Urine-UPE24     Not Estab. mg/dL 5.0   Total Protein, Urine-Ur/day     30 - 150 mg/24 hr 80   ALBUMIN, U     % 100.0   ALPHA 1 URINE     % 0.0   Alpha 2, Urine     % 0.0   % BETA, Urine     % 0.0   GAMMA GLOBULIN URINE     % 0.0   Free Kappa Lt Chains,Ur     1.17 - 86.46  mg/L 9.33   Free Lambda Lt Chains,Ur     0.27 - 15.21 mg/L 1.06 (C)  Free Kappa/Lambda Ratio     1.83 - 14.26  8.80 (C)  Immunofixation Result, Urine Comment (C)  Total Volume 1,600   M-SPIKE %, Urine     Not Observed % Not Observed (C)  NOTE: Comment (C)  Total Protein ELP     6.0 - 8.5 g/dL 6.4   Albumin ELP     2.9 - 4.4 g/dL 3.0   Alpha-1-Globulin     0.0 - 0.4 g/dL 0.3   Alpha-2-Globulin     0.4 - 1.0 g/dL 0.9   Beta Globulin     0.7 - 1.3 g/dL 0.6 (L)   Gamma Globulin     0.4 - 1.8 g/dL 1.6   M-SPIKE, %     Not Observed g/dL 1.2 (H)   SPE Interp. Comment   Comment Comment   Globulin, Total     2.2 - 3.9 g/dL 3.4 (C)  A/G Ratio     0.7 - 1.7  0.9 (C)  Kappa free light chain     3.3 - 19.4 mg/L 120.0 (H)   Lambda free light chains     5.7 - 26.3 mg/L 14.4   Kappa, lambda light chain ratio     0.26 - 1.65  8.33 (H)   LDH     98 - 192 U/L 172   Beta-2 Microglobulin     0.6 - 2.4 mg/L 3.1 (H)   Prostatic Specific Antigen     0.00 - 4.00 ng/mL 8.56 (H)     Legend: (L) Low (H) High (C) Corrected SURGICAL PATHOLOGY CASE: [id number] PATIENT: Pricilla Holm Surgical Pathology Report     Clinical History: Right Subtrochanteric Femur fracture     FINAL MICROSCOPIC DIAGNOSIS:  A. BONE, RIGHT HIP BIOPSY #1: -  Bone with bony regeneration, hemorrhage fibrosis and eosinophilic proteinaceous to material, refer to part C  B. BONE, RIGHT HIP BIOPSY #2: -  Fragments of bone with bony regeneration and marrow fibrosis, see part C  C. BONE, RIGHT HIP BIOPSY #3: -  Plasma cell neoplasm/plasmacytoma. -  Amyloidosis. -  See note.  D. BONE, RIGHT HIP BIOPSY #4: -  Bone with bony regeneration, focal plasmacytosis and eosinophilic proteinaceous debris, see part C  E. BONE, RIGHT HIP REAMINGS: -  Fragments of bone with bony regeneration. -  Fragments of hematopoietic marrow with orderly trilineage hematopoiesis and increased plasma cells, see part  C  Note: All the specimens show bone with bony regeneration, abundant hemorrhage and marrow fibrosis consistent with fracture callus.  In addition there are areas with sheets of plasma cells, best seen on part C.  The plasma cells (CD138 positive) are kappa restricted by kappa/lambda ISH consistent with a plasmacytoma/plasma cell neoplasm. Clinical/radiologic correlation with bone marrow biopsy if clinically indicated to rule out a systemic plasma cell myeloma.  In addition there are areas of eosinophilic proteinaceous material and a Congo red performed on part C highlights the presence of amyloid.   RADIOGRAPHIC STUDIES: I have personally reviewed the radiological images as listed and agreed with the findings in the report. DG FEMUR, MIN 2 VIEWS RIGHT  Result Date: 04/17/2022 CLINICAL DATA:  Postop EXAM: RIGHT FEMUR 2 VIEWS COMPARISON:  04/13/2022. FINDINGS: Recent placement intramedullary rod in the femur and compression screw fixation of the right femoral neck with grossly anatomic alignment. No evidence of hardware failure. IMPRESSION: Unremarkable post op changes ORIF right femoral neck and femur. Electronically Signed   By: Sammie Bench M.D.   On: 04/17/2022 13:59  DG HIP UNILAT WITH PELVIS 1V RIGHT  Result Date: 04/17/2022 CLINICAL DATA:  Fluoro guidance provided EXAM: DG HIP (WITH OR WITHOUT PELVIS) 1V RIGHT FINDINGS: Dose: 40.2 mGy Fluoro time 192s IMPRESSION: C-arm fluoro guidance provided. Electronically Signed   By: Sammie Bench M.D.   On: 04/17/2022 11:04   DG C-Arm 1-60 Min-No Report  Result Date: 04/17/2022 Fluoroscopy was utilized by the requesting physician.  No radiographic interpretation.   DG C-Arm 1-60 Min-No Report  Result Date: 04/17/2022 Fluoroscopy was utilized by the requesting physician.  No radiographic interpretation.   DG C-Arm 1-60 Min-No Report  Result Date: 04/17/2022 Fluoroscopy was utilized by the requesting physician.  No radiographic  interpretation.   CT CHEST ABDOMEN PELVIS WO CONTRAST  Result Date: 04/14/2022 CLINICAL DATA:  Suspected pathologic right hip fracture. EXAM: CT CHEST, ABDOMEN AND PELVIS WITHOUT CONTRAST TECHNIQUE: Multidetector CT imaging of the chest, abdomen and pelvis was performed following the standard protocol without IV contrast. RADIATION DOSE REDUCTION: This exam was performed according to the departmental dose-optimization program which includes automated exposure control, adjustment of the mA and/or kV according to patient size and/or use of iterative reconstruction technique. COMPARISON:  None Available. FINDINGS: CT CHEST FINDINGS Cardiovascular: The heart is normal in size. No pericardial effusion. There is tortuosity, ectasia and calcification of the thoracic aorta. Three-vessel coronary artery calcifications are noted. Mediastinum/Nodes: No mediastinal or hilar mass or lymphadenopathy. The esophagus is grossly normal. Thyroid gland is unremarkable. Lungs/Pleura: No pulmonary mass or pulmonary nodules. No acute pulmonary process. No pleural effusions or pleural nodules. The central tracheobronchial tree is unremarkable. Musculoskeletal: Underline ostial crow CIS. This makes detection of lytic lesions somewhat difficult. I do not see any obvious sternal or rib lesions. Could not exclude lytic lesions involving the T9 and T10 vertebral bodies. Asymmetric right-sided gynecomastia noted without obvious breast mass. CT ABDOMEN PELVIS FINDINGS Hepatobiliary: Small hepatic cysts are unchanged since prior CT scan. No worrisome hepatic lesions without contrast. No intrahepatic biliary dilatation. The gallbladder is unremarkable. No common bile duct dilatation. Pancreas: Age related pancreatic atrophy but no mass or acute inflammation. Spleen: Normal size.  No focal lesions. Adrenals/Urinary Tract: No adrenal gland lesions. No worrisome renal lesions without contrast. Stable simple bilateral renal cysts not requiring any  further evaluation or follow-up. The bladder is grossly normal. Part of the bladder is in a right inguinal hernia. This is a stable finding. Stomach/Bowel: The stomach, duodenum, small and colon are grossly normal. No acute inflammatory process, mass lesions or obstructive findings. Diverticulum noted at the gastric cardia. Descending and sigmoid colon diverticulosis noted along with moderate stool in the rectum. Vascular/Lymphatic: Scattered atherosclerotic calcification involving the aorta and iliac arteries but no aneurysm. No mesenteric or retroperitoneal mass or adenopathy. Reproductive: The prostate gland and seminal vesicles are grossly. Other: There is a large right inguinal hernia containing small bowel loops and part of the bladder. Musculoskeletal: Subacute displaced right hip fracture again noted with areas of attempted callus formation and heterotopic ossification. There is a cortical defect involving the right inferior pubic ramus and also the left acetabulum consistent with destructive lytic bone disease. There is also a lytic lesion involving the posterior aspect of the L2 vertebral body with cortical destruction and a small amount of tumor in the canal. IMPRESSION: 1. Subacute displaced right hip fracture with areas of attempted callus formation and heterotopic ossification. 2. 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  Could not exclude lytic lesions involving the T9 and T10 vertebral bodies. Other lytic lesions could be difficult to identify because of underlying osteoporosis. Myeloma could be a consideration. PET-CT may be helpful for further evaluation and to help identify a suitable biopsy site. 3. No obvious primary neoplastic process is identified without contrast. 4. Large right inguinal hernia containing small bowel loops and part of the bladder. 5. No acute pulmonary findings or pulmonary nodules.  6. No acute abdominal/pelvic findings, mass lesions or adenopathy. 7. Aortic atherosclerosis. 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 Electronically Signed   By: Marijo Sanes M.D.   On: 04/14/2022 13:07   CT Hip Right Wo Contrast  Result Date: 04/13/2022 CLINICAL DATA:  Right femur fracture. EXAM: CT OF THE RIGHT HIP WITHOUT CONTRAST TECHNIQUE: Multidetector CT imaging of the right hip was performed according to the standard protocol. Multiplanar CT image reconstructions were also generated. RADIATION DOSE REDUCTION: This exam was performed according to the departmental dose-optimization program which includes automated exposure control, adjustment of the mA and/or kV according to patient size and/or use of iterative reconstruction technique. COMPARISON:  Radiographs of same day. FINDINGS: Severely displaced fracture is seen involving the intertrochanteric region of the proximal right femur. There is seen lucency involving the right femoral neck which extends into trochanteric region and possible lytic destruction or malignancy cannot be excluded. There is a cortical defect involving the right inferior pubic ramus concerning for possible lytic lesion. Large right inguinal hernia is noted which appears to contain small bowel and a portion of the urinary bladder. IMPRESSION: Severely displaced fracture is seen involving the intertrochanteric region of the proximal right femur. There does appear to be lucency in the right femoral neck and intertrochanteric region suggesting the possibility of lytic lesion and pathologic fracture. There does appear to be cortical defect involving the right inferior pubic ramus suggesting possible metastatic lesion in this area. MRI may be performed for further evaluation. Also noted is large right inguinal hernia which appears to contain small bowel and a portion of the urinary bladder. Electronically Signed   By: Marijo Conception M.D.   On: 04/13/2022 15:50   DG Femur 1V  Right  Result Date: 04/13/2022 CLINICAL DATA:  Right leg pain. EXAM: RIGHT FEMUR 1 VIEW COMPARISON:  Right hip and knee radiographs today. FINDINGS: An acute, displaced subtrochanteric fracture of the right femur is again seen. The more distal femur appears intact. The knee is reported separately. Soft tissue swelling is noted. IMPRESSION: Subtrochanteric fracture of the right femur. Electronically Signed   By: Logan Bores M.D.   On: 04/13/2022 11:43   DG Knee Complete 4 Views Right  Result Date: 04/13/2022 CLINICAL DATA:  Right leg pain. EXAM: RIGHT KNEE - COMPLETE 4+ VIEW COMPARISON:  Right knee radiographs 03/29/2022 FINDINGS: No acute fracture, dislocation, or sizable residual knee joint effusion is identified. Tricompartmental osteoarthrosis is similar to the prior study, including severe medial compartment joint space narrowing. Soft tissue swelling about the knee has decreased. IMPRESSION: 1. Decreased soft tissue swelling and no sizable residual visible joint effusion. 2. No acute osseous abnormality. 3. Severe medial compartment osteoarthrosis. Electronically Signed   By: Logan Bores M.D.   On: 04/13/2022 11:40   DG Hip Unilat With Pelvis 2-3 Views Right  Result Date: 04/13/2022 CLINICAL DATA:  Right leg pain. EXAM: DG HIP (WITH OR WITHOUT PELVIS) 2-3V RIGHT COMPARISON:  None Available. FINDINGS: There is an acute, displaced subtrochanteric fracture of the right femur. The femoral head remains approximated  with the acetabulum on the AP radiographs. Cross-table lateral radiographs are limited by superimposed tissues and underpenetration. Sequelae of previous left femur ORIF are partially visualized. IMPRESSION: Subtrochanteric fracture of the right femur. Electronically Signed   By: Logan Bores M.D.   On: 04/13/2022 11:35   MR KNEE RIGHT WO CONTRAST  Result Date: 04/01/2022 CLINICAL DATA:  Continued right knee pain and swelling for 7 days with limited weight-bearing. Soft tissue infection  suspected. EXAM: MRI OF THE RIGHT KNEE WITHOUT CONTRAST TECHNIQUE: Multiplanar, multisequence MR imaging of the knee was performed. No intravenous contrast was administered. COMPARISON:  Radiographs 03/29/2022 and 03/23/2022. FINDINGS: Despite efforts by the technologist and patient, mild motion artifact is present on today's exam and could not be eliminated. This reduces exam sensitivity and specificity. MENISCI Medial meniscus: The medial meniscus is diffusely diminutive with free edge blunting and peripheral extrusion from the joint space. Medical record indicates previous knee arthroscopy, and these findings could be postsurgical, but are suspicious for diffuse degenerative tearing. No centrally displaced meniscal fragments identified. Lateral meniscus:  Intact with normal morphology. LIGAMENTS Cruciates:  Intact. Collaterals: Intact. There is medial buckling of the MCL related to the meniscal extrusion and marginal osteophytes. CARTILAGE Patellofemoral: Moderate patellofemoral degenerative changes with chondral thinning, surface irregularity and osteophytes. 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 No subchondral edema or erosive changes. Lateral: Mild chondral thinning and peripheral osteophyte formation. No subchondral edema or erosive changes. MISCELLANEOUS Joint:  Small nonspecific mildly complex knee joint effusion. Popliteal Fossa: The popliteus muscle and tendon are intact. Small Baker's cyst. Extensor Mechanism: Patella baja with laxity of the patellar tendon similar to recent radiographs. The visualized quadriceps tendon appears intact. There is heterogeneous T2 hyperintensity throughout the distal vastus lateralis muscle which appears mildly atrophied on the T1 weighted images. Bones: No evidence of acute fracture or dislocation. No evidence of osteomyelitis. Other: As above, T2 hyperintensity in the distal vastus lateralis muscle with  surrounding subcutaneous edema. There is additional subcutaneous edema posteromedially. No focal fluid collections are identified. IMPRESSION: 1. No acute osseous findings or specific evidence of osteomyelitis. Small complex nonspecific knee joint effusion. 2. Heterogeneous T2 hyperintensity throughout the distal vastus lateralis muscle with surrounding subcutaneous edema. These findings are nonspecific and could reflect myositis, subacute denervation or a partial tear. No focal fluid collections identified. 3. Patella baja with laxity of the patellar tendon. The visualized quadriceps tendon appears intact. 4. Tricompartmental degenerative changes, most advanced in the medial compartment. 5. Post meniscectomy changes or chronic degenerative tearing of the medial meniscus. 6. The lateral meniscus, cruciate and collateral ligaments are intact. Electronically Signed   By: Richardean Sale M.D.   On: 04/01/2022 12:08   DG Knee Complete 4 Views Right  Result Date: 03/29/2022 CLINICAL DATA:  Chronic knee pain. EXAM: RIGHT KNEE - COMPLETE 4+ VIEW COMPARISON:  03/23/2018 for FINDINGS: Small suprapatellar joint effusion. Progressive soft tissue edema about the distal portion of the femur and knee. No acute fracture marked medial compartment joint space narrowing with marginal spur formation. Mild degenerative changes noted within the patellofemoral and lateral compartment. Sharpening of the tibial spines. Mild chondrocalcinosis. IMPRESSION: 1. Progressive soft tissue edema about the distal femur and knee. 2. Small joint effusion. 3. 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 Electronically Signed   By: Kerby Moors M.D.   On: 03/29/2022 12:59   VAS [place] LOWER EXTREMITY VENOUS (DVT)  Result Date: 03/23/2022  Lower Venous DVT Study Patient Name:  DERRIC  Clarnce Flock  Date of Exam:   03/23/2022 Medical Rec #: [id number]            Accession #:    [id number] Date of Birth: 02/08/1941             Patient  Gender: M Patient Age:   82 years Exam Location:  [facility] Procedure:      VAS [place] LOWER EXTREMITY VENOUS (DVT) Referring Phys: Marda Stalker --------------------------------------------------------------------------------  Indications: Knee pain.  Limitations: Poor ultrasound/tissue interface. Performing Technologist: Rogelia Rohrer RVT, RDMS  Examination Guidelines: A complete evaluation includes B-mode imaging, spectral Doppler, color Doppler, and power Doppler as needed of all accessible portions of each vessel. Bilateral testing is considered an integral part of a complete examination. Limited examinations for reoccurring indications may be performed as noted. The reflux portion of the exam is performed with the patient in reverse Trendelenburg.  +---------+---------------+---------+-----------+----------+--------------+ RIGHT    CompressibilityPhasicitySpontaneityPropertiesThrombus Aging +---------+---------------+---------+-----------+----------+--------------+ CFV      Full           Yes      Yes                                 +---------+---------------+---------+-----------+----------+--------------+ SFJ      Full                                                        +---------+---------------+---------+-----------+----------+--------------+ FV Prox  Full           Yes      Yes                                 +---------+---------------+---------+-----------+----------+--------------+ FV Mid   Full           Yes      Yes                                 +---------+---------------+---------+-----------+----------+--------------+ FV DistalFull           Yes      Yes                                 +---------+---------------+---------+-----------+----------+--------------+ PFV      Full                                                        +---------+---------------+---------+-----------+----------+--------------+ POP      Full           Yes       Yes                                 +---------+---------------+---------+-----------+----------+--------------+ PTV      Full                                                        +---------+---------------+---------+-----------+----------+--------------+  PERO     Full                                                        +---------+---------------+---------+-----------+----------+--------------+   +----+---------------+---------+-----------+----------+--------------+ LEFTCompressibilityPhasicitySpontaneityPropertiesThrombus Aging +----+---------------+---------+-----------+----------+--------------+ CFV Full           Yes      Yes                                 +----+---------------+---------+-----------+----------+--------------+     Summary: RIGHT: - No evidence of common femoral vein obstruction. - No cystic structure found in the popliteal fossa. Subcutaneous edema seen in area of popliteal fossa and calf.  LEFT: - No evidence of common femoral vein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

## 2022-04-22 ENCOUNTER — Encounter (HOSPITAL_COMMUNITY): Payer: Self-pay | Admitting: Internal Medicine

## 2022-04-22 ENCOUNTER — Inpatient Hospital Stay (HOSPITAL_COMMUNITY): Payer: Medicare PPO

## 2022-04-22 DIAGNOSIS — I10 Essential (primary) hypertension: Secondary | ICD-10-CM | POA: Diagnosis not present

## 2022-04-22 DIAGNOSIS — R4701 Aphasia: Secondary | ICD-10-CM | POA: Diagnosis not present

## 2022-04-22 DIAGNOSIS — N183 Chronic kidney disease, stage 3 unspecified: Secondary | ICD-10-CM | POA: Diagnosis not present

## 2022-04-22 DIAGNOSIS — M25561 Pain in right knee: Secondary | ICD-10-CM | POA: Diagnosis not present

## 2022-04-22 DIAGNOSIS — M6281 Muscle weakness (generalized): Secondary | ICD-10-CM | POA: Diagnosis not present

## 2022-04-22 DIAGNOSIS — E8589 Other amyloidosis: Secondary | ICD-10-CM | POA: Diagnosis not present

## 2022-04-22 DIAGNOSIS — E785 Hyperlipidemia, unspecified: Secondary | ICD-10-CM | POA: Diagnosis not present

## 2022-04-22 DIAGNOSIS — M84551D Pathological fracture in neoplastic disease, right femur, subsequent encounter for fracture with routine healing: Secondary | ICD-10-CM | POA: Diagnosis not present

## 2022-04-22 DIAGNOSIS — M25551 Pain in right hip: Secondary | ICD-10-CM | POA: Diagnosis not present

## 2022-04-22 DIAGNOSIS — S7221XP Displaced subtrochanteric fracture of right femur, subsequent encounter for closed fracture with malunion: Secondary | ICD-10-CM | POA: Diagnosis not present

## 2022-04-22 DIAGNOSIS — S79919A Unspecified injury of unspecified hip, initial encounter: Secondary | ICD-10-CM | POA: Diagnosis not present

## 2022-04-22 DIAGNOSIS — E559 Vitamin D deficiency, unspecified: Secondary | ICD-10-CM | POA: Diagnosis not present

## 2022-04-22 DIAGNOSIS — D649 Anemia, unspecified: Secondary | ICD-10-CM | POA: Diagnosis not present

## 2022-04-22 DIAGNOSIS — L89610 Pressure ulcer of right heel, unstageable: Secondary | ICD-10-CM | POA: Diagnosis not present

## 2022-04-22 DIAGNOSIS — R630 Anorexia: Secondary | ICD-10-CM | POA: Diagnosis not present

## 2022-04-22 DIAGNOSIS — R262 Difficulty in walking, not elsewhere classified: Secondary | ICD-10-CM | POA: Diagnosis not present

## 2022-04-22 DIAGNOSIS — C7951 Secondary malignant neoplasm of bone: Secondary | ICD-10-CM | POA: Diagnosis not present

## 2022-04-22 DIAGNOSIS — Z7982 Long term (current) use of aspirin: Secondary | ICD-10-CM | POA: Diagnosis not present

## 2022-04-22 DIAGNOSIS — M84551A Pathological fracture in neoplastic disease, right femur, initial encounter for fracture: Secondary | ICD-10-CM | POA: Diagnosis not present

## 2022-04-22 DIAGNOSIS — R197 Diarrhea, unspecified: Secondary | ICD-10-CM | POA: Diagnosis not present

## 2022-04-22 DIAGNOSIS — I509 Heart failure, unspecified: Secondary | ICD-10-CM

## 2022-04-22 DIAGNOSIS — E8581 Light chain (AL) amyloidosis: Secondary | ICD-10-CM | POA: Diagnosis not present

## 2022-04-22 DIAGNOSIS — S7221XA Displaced subtrochanteric fracture of right femur, initial encounter for closed fracture: Secondary | ICD-10-CM | POA: Diagnosis not present

## 2022-04-22 DIAGNOSIS — L853 Xerosis cutis: Secondary | ICD-10-CM | POA: Diagnosis not present

## 2022-04-22 DIAGNOSIS — Z79899 Other long term (current) drug therapy: Secondary | ICD-10-CM | POA: Diagnosis not present

## 2022-04-22 DIAGNOSIS — N1831 Chronic kidney disease, stage 3a: Secondary | ICD-10-CM | POA: Diagnosis not present

## 2022-04-22 DIAGNOSIS — D509 Iron deficiency anemia, unspecified: Secondary | ICD-10-CM | POA: Diagnosis not present

## 2022-04-22 DIAGNOSIS — M1711 Unilateral primary osteoarthritis, right knee: Secondary | ICD-10-CM | POA: Diagnosis not present

## 2022-04-22 DIAGNOSIS — R300 Dysuria: Secondary | ICD-10-CM | POA: Diagnosis not present

## 2022-04-22 DIAGNOSIS — M25461 Effusion, right knee: Secondary | ICD-10-CM | POA: Diagnosis not present

## 2022-04-22 DIAGNOSIS — R5381 Other malaise: Secondary | ICD-10-CM | POA: Diagnosis not present

## 2022-04-22 DIAGNOSIS — N390 Urinary tract infection, site not specified: Secondary | ICD-10-CM | POA: Diagnosis not present

## 2022-04-22 DIAGNOSIS — Z7189 Other specified counseling: Secondary | ICD-10-CM

## 2022-04-22 DIAGNOSIS — C9030 Solitary plasmacytoma not having achieved remission: Secondary | ICD-10-CM | POA: Diagnosis not present

## 2022-04-22 DIAGNOSIS — C9000 Multiple myeloma not having achieved remission: Secondary | ICD-10-CM | POA: Diagnosis present

## 2022-04-22 DIAGNOSIS — R29898 Other symptoms and signs involving the musculoskeletal system: Secondary | ICD-10-CM | POA: Diagnosis not present

## 2022-04-22 DIAGNOSIS — I130 Hypertensive heart and chronic kidney disease with heart failure and stage 1 through stage 4 chronic kidney disease, or unspecified chronic kidney disease: Secondary | ICD-10-CM | POA: Diagnosis not present

## 2022-04-22 DIAGNOSIS — E0590 Thyrotoxicosis, unspecified without thyrotoxic crisis or storm: Secondary | ICD-10-CM | POA: Diagnosis not present

## 2022-04-22 DIAGNOSIS — S72001A Fracture of unspecified part of neck of right femur, initial encounter for closed fracture: Secondary | ICD-10-CM | POA: Diagnosis not present

## 2022-04-22 DIAGNOSIS — R1310 Dysphagia, unspecified: Secondary | ICD-10-CM | POA: Diagnosis not present

## 2022-04-22 DIAGNOSIS — Z5112 Encounter for antineoplastic immunotherapy: Secondary | ICD-10-CM | POA: Diagnosis present

## 2022-04-22 DIAGNOSIS — Z7401 Bed confinement status: Secondary | ICD-10-CM | POA: Diagnosis not present

## 2022-04-22 DIAGNOSIS — S7221XD Displaced subtrochanteric fracture of right femur, subsequent encounter for closed fracture with routine healing: Secondary | ICD-10-CM | POA: Diagnosis not present

## 2022-04-22 DIAGNOSIS — Z79624 Long term (current) use of inhibitors of nucleotide synthesis: Secondary | ICD-10-CM | POA: Diagnosis not present

## 2022-04-22 LAB — CBC WITH DIFFERENTIAL/PLATELET
Abs Immature Granulocytes: 0.03 10*3/uL (ref 0.00–0.07)
Basophils Absolute: 0 10*3/uL (ref 0.0–0.1)
Basophils Relative: 1 %
Eosinophils Absolute: 0.4 10*3/uL (ref 0.0–0.5)
Eosinophils Relative: 8 %
HCT: 24.8 % — ABNORMAL LOW (ref 39.0–52.0)
Hemoglobin: 8.3 g/dL — ABNORMAL LOW (ref 13.0–17.0)
Immature Granulocytes: 1 %
Lymphocytes Relative: 18 %
Lymphs Abs: 0.9 10*3/uL (ref 0.7–4.0)
MCH: 30.6 pg (ref 26.0–34.0)
MCHC: 33.5 g/dL (ref 30.0–36.0)
MCV: 91.5 fL (ref 80.0–100.0)
Monocytes Absolute: 0.6 10*3/uL (ref 0.1–1.0)
Monocytes Relative: 12 %
Neutro Abs: 3.1 10*3/uL (ref 1.7–7.7)
Neutrophils Relative %: 60 %
Platelets: 143 10*3/uL — ABNORMAL LOW (ref 150–400)
RBC: 2.71 MIL/uL — ABNORMAL LOW (ref 4.22–5.81)
RDW: 14.8 % (ref 11.5–15.5)
WBC: 5.1 10*3/uL (ref 4.0–10.5)
nRBC: 0 % (ref 0.0–0.2)

## 2022-04-22 LAB — PHOSPHORUS: Phosphorus: 3.2 mg/dL (ref 2.5–4.6)

## 2022-04-22 LAB — ECHOCARDIOGRAM COMPLETE
AR max vel: 2.65 cm2
AV Area VTI: 2.69 cm2
AV Area mean vel: 2.62 cm2
AV Mean grad: 4.5 mmHg
AV Peak grad: 8.7 mmHg
Ao pk vel: 1.48 m/s
Area-P 1/2: 4.6 cm2
Calc EF: 63.2 %
Height: 69.5 in
P 1/2 time: 632 msec
S' Lateral: 3 cm
Single Plane A2C EF: 58.3 %
Single Plane A4C EF: 65.1 %
Weight: 3315.72 oz

## 2022-04-22 MED ORDER — PROCHLORPERAZINE MALEATE 10 MG PO TABS: 10.0000 mg | ORAL_TABLET | Freq: Four times a day (QID) | ORAL | 1 refills | Status: DC | PRN

## 2022-04-22 MED ORDER — PERFLUTREN LIPID MICROSPHERE
1.0000 mL | INTRAVENOUS | Status: AC | PRN
  Administered 2022-04-22: 2 mL via INTRAVENOUS

## 2022-04-22 MED ORDER — FENTANYL CITRATE (PF) 100 MCG/2ML IJ SOLN
INTRAMUSCULAR | Status: AC
  Filled 2022-04-22: qty 2

## 2022-04-22 MED ORDER — LIDOCAINE HCL (PF) 1 % IJ SOLN
INTRAMUSCULAR | Status: AC | PRN
  Administered 2022-04-22: 15 mL

## 2022-04-22 MED ORDER — FENTANYL CITRATE (PF) 100 MCG/2ML IJ SOLN
INTRAMUSCULAR | Status: AC | PRN
  Administered 2022-04-22 (×2): 50 ug via INTRAVENOUS

## 2022-04-22 MED ORDER — MIDAZOLAM HCL 2 MG/2ML IJ SOLN
INTRAMUSCULAR | Status: AC | PRN
  Administered 2022-04-22 (×2): 1 mg via INTRAVENOUS

## 2022-04-22 MED ORDER — ACYCLOVIR 400 MG PO TABS: 400.0000 mg | ORAL_TABLET | Freq: Two times a day (BID) | ORAL | 5 refills | Status: DC

## 2022-04-22 MED ORDER — MIDAZOLAM HCL 2 MG/2ML IJ SOLN
INTRAMUSCULAR | Status: AC
  Filled 2022-04-22: qty 2

## 2022-04-22 MED ORDER — VITAMIN D (ERGOCALCIFEROL) 1.25 MG (50000 UNIT) PO CAPS: 50000.0000 [IU] | ORAL_CAPSULE | ORAL | 0 refills | Status: DC

## 2022-04-22 MED ORDER — ONDANSETRON HCL 8 MG PO TABS: ORAL_TABLET | ORAL | 1 refills | Status: DC

## 2022-04-22 NOTE — Discharge Summary (Signed)
Physician Discharge Summary  Frank Hernandez [id number] DOB: 12-10-1940 DOA: 04/13/2022  PCP: [name] (Inactive)  Admit date: 04/13/2022 Discharge date: 04/22/2022  Admitted From: Home Disposition:  Home  Discharge Condition:Stable CODE STATUS:FULL Diet recommendation:  Regular  Brief/Interim Summary: Patient is a 82 year old male with history of hypertension, hyperlipidemia, CKD stage III, prediabetes, recent left hip fracture status post intramedullary intertrochanteric nailing on 06/28/2020, recent hospitalization for nontraumatic right knee pain/effusion on 1/20  status post aspiration and steroid injection felt to be secondary to osteoarthritis and was sent to SNF presented back with right hip pain.  Plain films of the right hip and pelvis showed subtrochanteric fracture of the right femur.  CT right hip was concerning for pathological right peritrochanteric femur fracture , lytic lesion.  Orthopedics consulted, status post right intramedullary nailing and biopsy of right proximal femur.  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  Oncology consulted and planning for outpatient PET/CT, chemotherapy.  Medically stable for discharge to SNF today  Following problems were addressed during the hospitalization:  Closed subtrochanteric right femur fracture/concern for multiple myeloma/metastatic disease: Presented from SNF with right hip pain.  No recent history of fall or trauma.  Spontaneous fracture.  CT of right hip showed severe displaced fracture involving the intertrochanteric region of the proximal right femur, lucency noted in the right femoral neck suggesting possibility of lytic lesion and pathological fracture.  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  No obvious primary neoplastic process identified. No acute abdominal/pelvic findings, mass lesions or adenopathy. PSA elevated at 8.5.  Kappa free chain elevated at 120. Orthopedics consulted, status post  right intramedullary nailing and biopsy of right proximal femur.  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  Oncology consulted and planning for outpatient PET/CT, chemotherapy.Underwent bone marrow biopsy today.  Echo showed normal EF  Hypophosphatemia:  supplemented and corrected   Hypertension: On Norvasc, Cardura   AKI on CKD 3a: Stable.   Hyperlipidemia: On statin   Normocytic anemia: Likely history with malignancy.  He is status post 3 units of blood transfusion during this hospitalization.  Hemoglobin in the range of 8.  Has mild thrombocytopenia.  On oral iron supplementation   Prediabetes: A1c of 6.  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)   Vitamin D deficiency   Multiple myeloma not having achieved remission ([place])   Counseling regarding advance care planning and goals of care    Discharge Instructions  Discharge Instructions     Diet general   Complete by: As directed    Discharge instructions   Complete by: As directed    1)Please take prescribed  medications as instructed 2)Follow up with Dr Kale,Oncology as an outpatient as soon  as possible,you might be called for appointment   Increase activity slowly   Complete by: As directed    No wound care   Complete by: As directed    Clinic Appointment Request   Complete by: Apr 29, 2022    Contact your oncology clinic or infusion center to schedule this appointment.   Infusion Appointment Request   Complete by: Apr 29, 2022    Contact your oncology clinic or infusion center to schedule this appointment.   Lab Appointment Request   Complete by: Apr 29, 2022    Contact your oncology clinic or infusion center to schedule this appointment.   Clinic Appointment Request   Complete by: May 06, 2022    Contact your oncology clinic or infusion center to schedule this appointment.   Infusion Appointment Request   Complete by: May 06, 2022    Contact your oncology clinic or infusion center to schedule this appointment.   Lab Appointment Request   Complete by: May 06, 2022    Contact your oncology clinic or infusion center to schedule this appointment.   Clinic Appointment Request   Complete by: May 13, 2022    Contact your oncology clinic or infusion center to schedule this appointment.   Infusion Appointment Request   Complete by: May 13, 2022    Contact your oncology clinic or infusion center to schedule this appointment.   Lab Appointment Request   Complete by: May 13, 2022    Contact your oncology clinic or infusion center to schedule this appointment.   Clinic Appointment Request   Complete by: May 20, 2022    Contact your oncology clinic or infusion center to schedule this appointment.   Infusion Appointment Request   Complete by: May 20, 2022    Contact your oncology clinic or infusion center to schedule this appointment.   Lab Appointment Request   Complete by: May 20, 2022    Contact your oncology clinic or infusion center to schedule this appointment.      Allergies as of  04/22/2022   No Known Allergies      Medication List     STOP taking these medications    HYDROcodone-acetaminophen 5-325 MG tablet Commonly known as: NORCO/VICODIN   traMADol 50 MG tablet Commonly known as: Ultram       TAKE these medications    acetaminophen 500 MG tablet Commonly known as: TYLENOL Take 2 tablets (1,000 mg total) by mouth 3 (three) times daily for 21 days. What changed:  medication strength how much to take when to take this reasons to take this   acyclovir 400 MG tablet Commonly known as: ZOVIRAX Take 1 tablet (400 mg total) by mouth 2 (two) times daily.   amLODipine 10 MG tablet Commonly known as: NORVASC Take 10 mg by mouth daily.   aspirin EC 81 MG tablet Take 1 tablet (81 mg total) by mouth 2 (two) times daily. Swallow whole.   atorvastatin 40 MG tablet Commonly known as: LIPITOR Take 40 mg by mouth every evening.   DOXAZOSIN MESYLATE PO Take 2 mg by mouth every evening.   feeding supplement Liqd Take 237 mLs by mouth 2 (two) times daily between meals. What changed:  when to take this additional instructions   ferrous sulfate 325 (65 FE) MG EC tablet Take 1 tablet (325 mg total) by mouth 2 (two) times daily.   multivitamins with iron Tabs tablet Take 1 tablet by mouth daily.   ondansetron 8 MG tablet  Commonly known as: Zofran Take 8 mg by mouth 30 to 60 min prior to Cyclophosphamide administration then take 8 mg every 8 hrs as needed for nausea and vomiting.   oxyCODONE 5 MG immediate release tablet Commonly known as: Roxicodone Take 1-2 tablets (5-10 mg total) by mouth every 4 (four) hours as needed for severe pain or moderate pain.   polyethylene glycol 17 g packet Commonly known as: MiraLax Take 17 g by mouth daily.   prochlorperazine 10 MG tablet Commonly known as: COMPAZINE Take 1 tablet (10 mg total) by mouth every 6 (six) hours as needed for nausea or vomiting.   senna-docusate 8.6-50 MG tablet Commonly known  as: Senokot-S Take 1 tablet by mouth 2 (two) times daily for 14 days. What changed:  when to take this reasons to take this   Vitamin D (Ergocalciferol) 1.25 MG (50000 UNIT) Caps capsule Commonly known as: DRISDOL Take 1 capsule (50,000 Units total) by mouth once a week. Start taking on: April 26, 2022        Follow-up Information     Brunetta Genera, MD. Schedule an appointment as soon as possible for a visit in 1 week(s).   Specialties: Hematology, Oncology Contact information: [place] [address] [phone]                No Known Allergies  Consultations: Oncology,Orthopedics,IR   Procedures/Studies: ECHOCARDIOGRAM COMPLETE  Result Date: 04/22/2022    ECHOCARDIOGRAM REPORT   Patient Name:   Frank Hernandez Date of Exam: 04/22/2022 Medical Rec #:  [id number]           Height:       69.5 in Accession #:    [id number]          Weight:       207.2 lb Date of Birth:  29-Jun-1940            BSA:          2.108 m Patient Age:    64 years            BP:           128/72 mmHg Patient Gender: M                   HR:           68 bpm. Exam Location:  Inpatient Procedure: 2D Echo and Intracardiac Opacification Agent Indications:    CHF  History:        Patient has prior history of Echocardiogram examinations and                 Patient has no prior history of Echocardiogram examinations.                 Risk Factors:Hypertension.  Sonographer:    Harvie Junior Referring Phys: [id number] Brunetta Genera  Sonographer Comments: Technically difficult study due to poor echo windows, suboptimal parasternal window, no subcostal window and patient is obese. Image acquisition challenging due to patient body habitus and Image acquisition challenging due to respiratory motion. IMPRESSIONS  1. Left ventricular ejection fraction, by estimation, is 60 to 65%. The left ventricle has normal function. The left ventricle has no regional wall motion abnormalities.  Left ventricular diastolic parameters are indeterminate.  2. Right ventricular systolic function is normal. The right ventricular size is normal.  3. The mitral valve is grossly normal. No evidence of mitral valve regurgitation. No evidence of mitral stenosis.  4. The aortic valve was not well visualized. Aortic valve regurgitation is mild. Aortic valve sclerosis is present, with no evidence of aortic valve stenosis.  5. IVC not well visualized. FINDINGS  Left Ventricle: Left ventricular ejection fraction, by estimation, is 60 to 65%. The left ventricle has normal function. The left ventricle has no regional wall motion abnormalities. Definity contrast agent was given IV to delineate the left ventricular  endocardial borders. 3D left ventricular ejection fraction analysis performed but not reported based on interpreter judgement due to suboptimal tracking. The left ventricular internal cavity size was normal in size. There is no left ventricular hypertrophy. Left ventricular diastolic parameters are indeterminate. Right Ventricle: The right ventricular size is normal. No increase in right ventricular wall thickness. Right ventricular systolic function is normal. Left Atrium: Left atrial size was normal in size. Right Atrium: Right atrial size was normal in size. Pericardium: There is no evidence of pericardial effusion. Mitral Valve: The mitral valve is grossly normal. No evidence of mitral valve regurgitation. No evidence of mitral valve stenosis. Tricuspid Valve: The tricuspid valve is normal in structure. Tricuspid valve regurgitation is trivial. No evidence of tricuspid stenosis. Aortic Valve: The aortic valve was not well visualized. Aortic valve regurgitation is mild. Aortic regurgitation PHT measures 632 msec. Aortic valve sclerosis is present, with no evidence of aortic valve stenosis. Aortic valve mean gradient measures 4.5 mmHg. Aortic valve peak gradient measures 8.7 mmHg. Aortic valve area, by VTI  measures 2.69 cm. Pulmonic Valve: The pulmonic valve was normal in structure. Pulmonic valve regurgitation is not visualized. No evidence of pulmonic stenosis. Aorta: The aortic root is normal in size and structure. Venous: The inferior vena cava was not well visualized. IAS/Shunts: No atrial level shunt detected by color flow Doppler.  LEFT VENTRICLE PLAX 2D LVIDd:         4.20 cm     Diastology LVIDs:         3.00 cm     LV e' medial:    13.25 cm/s LV PW:         0.90 cm     LV E/e' medial:  8.3 LV IVS:        1.00 cm     LV e' lateral:   15.80 cm/s LVOT diam:     2.20 cm     LV E/e' lateral: 7.0 LV SV:         71 LV SV Index:   34 LVOT Area:     3.80 cm                             3D Volume EF: LV Volumes (MOD)           LV EDV:       126 ml LV vol d, MOD A2C: 45.3 ml LV ESV:       47 ml LV vol d, MOD A4C: 84.7 ml LV SV:        79 ml LV vol s, MOD A2C: 18.9 ml LV vol s, MOD A4C: 29.6 ml LV SV MOD A2C:     26.4 ml LV SV MOD A4C:     84.7 ml LV SV MOD BP:      40.7 ml RIGHT VENTRICLE RV Basal diam:  3.80 cm RV Mid diam:    3.00 cm RV S prime:     15.55 cm/s TAPSE (M-mode): 2.0 cm LEFT ATRIUM  Index        RIGHT ATRIUM           Index LA Vol (A2C):   46.6 ml 22.11 ml/m  RA Area:     15.30 cm LA Vol (A4C):   40.9 ml 19.40 ml/m  RA Volume:   40.60 ml  19.26 ml/m LA Biplane Vol: 48.2 ml 22.87 ml/m  AORTIC VALVE                    PULMONIC VALVE AV Area (Vmax):    2.65 cm     PV Vmax:       1.06 m/s AV Area (Vmean):   2.62 cm     PV Peak grad:  4.5 mmHg AV Area (VTI):     2.69 cm AV Vmax:           147.50 cm/s AV Vmean:          94.975 cm/s AV VTI:            0.264 m AV Peak Grad:      8.7 mmHg AV Mean Grad:      4.5 mmHg LVOT Vmax:         102.85 cm/s LVOT Vmean:        65.400 cm/s LVOT VTI:          0.187 m LVOT/AV VTI ratio: 0.71 AI PHT:            632 msec  AORTA Ao Root diam: 3.50 cm MITRAL VALVE                TRICUSPID VALVE MV Area (PHT): 4.60 cm     TR Peak grad:   29.4 mmHg MV Decel Time:  165 msec     TR Vmax:        271.00 cm/s MV E velocity: 110.00 cm/s MV A velocity: 61.80 cm/s   SHUNTS MV E/A ratio:  1.78         Systemic VTI:  0.19 m                             Systemic Diam: 2.20 cm Cherlynn Kaiser MD Electronically signed by Cherlynn Kaiser MD Signature Date/Time: 04/22/2022/3:32:25 PM    Final    CT BONE MARROW BIOPSY & ASPIRATION  Result Date: 04/22/2022 INDICATION: Right hip biopsy demonstrated plasmacytoma. Bone marrow biopsy requested to evaluate for multiple myeloma. EXAM: CT GUIDED BONE MARROW ASPIRATES AND BIOPSY Physician: Stephan Minister. Henn, MD MEDICATIONS: Moderate sedation ANESTHESIA/SEDATION: Moderate (conscious) sedation was employed during this procedure. A total of Versed 59m and fentanyl 100 mcg was administered intravenously at the order of the provider performing the procedure. Total intra-service moderate sedation time: 10 minutes. Patient's level of consciousness and vital signs were monitored continuously by radiology nurse throughout the procedure under the supervision of the provider performing the procedure. COMPLICATIONS: None immediate. PROCEDURE: The procedure was explained to the patient. The risks and benefits of the procedure were discussed and the patient's questions were addressed. Informed consent was obtained from the patient. The patient was placed prone on CT table. Images of the pelvis were obtained. The back was prepped and draped in sterile fashion. The skin and right posterior ilium were anesthetized with 1% lidocaine. 11 gauge bone needle was directed into the right ilium with CT guidance. Two aspirates and one core biopsy were obtained. Bandage placed over the puncture site. RADIATION DOSE REDUCTION:  This exam was performed according to the departmental dose-optimization program which includes automated exposure control, adjustment of the mA and/or kV according to patient size and/or use of iterative reconstruction technique. FINDINGS: Subtle tiny lucent  areas in the pelvis are concerning for underlying myeloma. Biopsy needle was directed into the posterior right ilium. IMPRESSION: CT guided bone marrow aspiration and core biopsy. Electronically Signed   By: Markus Daft M.D.   On: 04/22/2022 14:07   DG FEMUR, MIN 2 VIEWS RIGHT  Result Date: 04/17/2022 CLINICAL DATA:  Postop EXAM: RIGHT FEMUR 2 VIEWS COMPARISON:  04/13/2022. FINDINGS: Recent placement intramedullary rod in the femur and compression screw fixation of the right femoral neck with grossly anatomic alignment. No evidence of hardware failure. IMPRESSION: Unremarkable post op changes ORIF right femoral neck and femur. Electronically Signed   By: Sammie Bench M.D.   On: 04/17/2022 13:59   DG HIP UNILAT WITH PELVIS 1V RIGHT  Result Date: 04/17/2022 CLINICAL DATA:  Fluoro guidance provided EXAM: DG HIP (WITH OR WITHOUT PELVIS) 1V RIGHT FINDINGS: Dose: 40.2 mGy Fluoro time 192s IMPRESSION: C-arm fluoro guidance provided. Electronically Signed   By: Sammie Bench M.D.   On: 04/17/2022 11:04   DG C-Arm 1-60 Min-No Report  Result Date: 04/17/2022 Fluoroscopy was utilized by the requesting physician.  No radiographic interpretation.   DG C-Arm 1-60 Min-No Report  Result Date: 04/17/2022 Fluoroscopy was utilized by the requesting physician.  No radiographic interpretation.   DG C-Arm 1-60 Min-No Report  Result Date: 04/17/2022 Fluoroscopy was utilized by the requesting physician.  No radiographic interpretation.   CT CHEST ABDOMEN PELVIS WO CONTRAST  Result Date: 04/14/2022 CLINICAL DATA:  Suspected pathologic right hip fracture. EXAM: CT CHEST, ABDOMEN AND PELVIS WITHOUT CONTRAST TECHNIQUE: Multidetector CT imaging of the chest, abdomen and pelvis was performed following the standard protocol without IV contrast. RADIATION DOSE REDUCTION: This exam was performed according to the departmental dose-optimization program which includes automated exposure control, adjustment of the mA  and/or kV according to patient size and/or use of iterative reconstruction technique. COMPARISON:  None Available. FINDINGS: CT CHEST FINDINGS Cardiovascular: The heart is normal in size. No pericardial effusion. There is tortuosity, ectasia and calcification of the thoracic aorta. Three-vessel coronary artery calcifications are noted. Mediastinum/Nodes: No mediastinal or hilar mass or lymphadenopathy. The esophagus is grossly normal. Thyroid gland is unremarkable. Lungs/Pleura: No pulmonary mass or pulmonary nodules. No acute pulmonary process. No pleural effusions or pleural nodules. The central tracheobronchial tree is unremarkable. Musculoskeletal: Underline ostial crow CIS. This makes detection of lytic lesions somewhat difficult. I do not see any obvious sternal or rib lesions. Could not exclude lytic lesions involving the T9 and T10 vertebral bodies. Asymmetric right-sided gynecomastia noted without obvious breast mass. CT ABDOMEN PELVIS FINDINGS Hepatobiliary: Small hepatic cysts are unchanged since prior CT scan. No worrisome hepatic lesions without contrast. No intrahepatic biliary dilatation. The gallbladder is unremarkable. No common bile duct dilatation. Pancreas: Age related pancreatic atrophy but no mass or acute inflammation. Spleen: Normal size.  No focal lesions. Adrenals/Urinary Tract: No adrenal gland lesions. No worrisome renal lesions without contrast. Stable simple bilateral renal cysts not requiring any further evaluation or follow-up. The bladder is grossly normal. Part of the bladder is in a right inguinal hernia. This is a stable finding. Stomach/Bowel: The stomach, duodenum, small and colon are grossly normal. No acute inflammatory process, mass lesions or obstructive findings. Diverticulum noted at the gastric cardia. Descending and sigmoid colon diverticulosis noted along with moderate  stool in the rectum. Vascular/Lymphatic: Scattered atherosclerotic calcification involving the aorta  and iliac arteries but no aneurysm. No mesenteric or retroperitoneal mass or adenopathy. Reproductive: The prostate gland and seminal vesicles are grossly. Other: There is a large right inguinal hernia containing small bowel loops and part of the bladder. Musculoskeletal: Subacute displaced right hip fracture again noted with areas of attempted callus formation and heterotopic ossification. There is a cortical defect involving the right inferior pubic ramus and also the left acetabulum consistent with destructive lytic bone disease. There is also a lytic lesion involving the posterior aspect of the L2 vertebral body with cortical destruction and a small amount of tumor in the canal. IMPRESSION: 1. Subacute displaced right hip fracture with areas of attempted callus formation and heterotopic ossification. 2. 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 Could not exclude lytic lesions involving the T9 and T10 vertebral bodies. Other lytic lesions could be difficult to identify because of underlying osteoporosis. Myeloma could be a consideration. PET-CT may be helpful for further evaluation and to help identify a suitable biopsy site. 3. No obvious primary neoplastic process is identified without contrast. 4. Large right inguinal hernia containing small bowel loops and part of the bladder. 5. No acute pulmonary findings or pulmonary nodules. 6. No acute abdominal/pelvic findings, mass lesions or adenopathy. 7. Aortic atherosclerosis. 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 Electronically Signed   By: Marijo Sanes M.D.   On: 04/14/2022 13:07   CT Hip Right Wo Contrast  Result Date: 04/13/2022 CLINICAL DATA:  Right femur fracture. EXAM: CT OF THE RIGHT HIP WITHOUT CONTRAST TECHNIQUE: Multidetector CT imaging of the right hip was performed according to the standard protocol. Multiplanar CT image reconstructions were also  generated. RADIATION DOSE REDUCTION: This exam was performed according to the departmental dose-optimization program which includes automated exposure control, adjustment of the mA and/or kV according to patient size and/or use of iterative reconstruction technique. COMPARISON:  Radiographs of same day. FINDINGS: Severely displaced fracture is seen involving the intertrochanteric region of the proximal right femur. There is seen lucency involving the right femoral neck which extends into trochanteric region and possible lytic destruction or malignancy cannot be excluded. There is a cortical defect involving the right inferior pubic ramus concerning for possible lytic lesion. Large right inguinal hernia is noted which appears to contain small bowel and a portion of the urinary bladder. IMPRESSION: Severely displaced fracture is seen involving the intertrochanteric region of the proximal right femur. There does appear to be lucency in the right femoral neck and intertrochanteric region suggesting the possibility of lytic lesion and pathologic fracture. There does appear to be cortical defect involving the right inferior pubic ramus suggesting possible metastatic lesion in this area. MRI may be performed for further evaluation. Also noted is large right inguinal hernia which appears to contain small bowel and a portion of the urinary bladder. Electronically Signed   By: Marijo Conception M.D.   On: 04/13/2022 15:50   DG Femur 1V Right  Result Date: 04/13/2022 CLINICAL DATA:  Right leg pain. EXAM: RIGHT FEMUR 1 VIEW COMPARISON:  Right hip and knee radiographs today. FINDINGS: An acute, displaced subtrochanteric fracture of the right femur is again seen. The more distal femur appears intact. The knee is reported separately. Soft tissue swelling is noted. IMPRESSION: Subtrochanteric fracture of the right femur. Electronically Signed   By: Logan Bores M.D.   On: 04/13/2022 11:43  DG Knee Complete 4 Views  Right  Result Date: 04/13/2022 CLINICAL DATA:  Right leg pain. EXAM: RIGHT KNEE - COMPLETE 4+ VIEW COMPARISON:  Right knee radiographs 03/29/2022 FINDINGS: No acute fracture, dislocation, or sizable residual knee joint effusion is identified. Tricompartmental osteoarthrosis is similar to the prior study, including severe medial compartment joint space narrowing. Soft tissue swelling about the knee has decreased. IMPRESSION: 1. Decreased soft tissue swelling and no sizable residual visible joint effusion. 2. No acute osseous abnormality. 3. Severe medial compartment osteoarthrosis. Electronically Signed   By: Logan Bores M.D.   On: 04/13/2022 11:40   DG Hip Unilat With Pelvis 2-3 Views Right  Result Date: 04/13/2022 CLINICAL DATA:  Right leg pain. EXAM: DG HIP (WITH OR WITHOUT PELVIS) 2-3V RIGHT COMPARISON:  None Available. FINDINGS: There is an acute, displaced subtrochanteric fracture of the right femur. The femoral head remains approximated with the acetabulum on the AP radiographs. Cross-table lateral radiographs are limited by superimposed tissues and underpenetration. Sequelae of previous left femur ORIF are partially visualized. IMPRESSION: Subtrochanteric fracture of the right femur. Electronically Signed   By: Logan Bores M.D.   On: 04/13/2022 11:35   MR KNEE RIGHT WO CONTRAST  Result Date: 04/01/2022 CLINICAL DATA:  Continued right knee pain and swelling for 7 days with limited weight-bearing. Soft tissue infection suspected. EXAM: MRI OF THE RIGHT KNEE WITHOUT CONTRAST TECHNIQUE: Multiplanar, multisequence MR imaging of the knee was performed. No intravenous contrast was administered. COMPARISON:  Radiographs 03/29/2022 and 03/23/2022. FINDINGS: Despite efforts by the technologist and patient, mild motion artifact is present on today's exam and could not be eliminated. This reduces exam sensitivity and specificity. MENISCI Medial meniscus: The medial meniscus is diffusely diminutive with free  edge blunting and peripheral extrusion from the joint space. Medical record indicates previous knee arthroscopy, and these findings could be postsurgical, but are suspicious for diffuse degenerative tearing. No centrally displaced meniscal fragments identified. Lateral meniscus:  Intact with normal morphology. LIGAMENTS Cruciates:  Intact. Collaterals: Intact. There is medial buckling of the MCL related to the meniscal extrusion and marginal osteophytes. CARTILAGE Patellofemoral: Moderate patellofemoral degenerative changes with chondral thinning, surface irregularity and osteophytes. 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 No subchondral edema or erosive changes. Lateral: Mild chondral thinning and peripheral osteophyte formation. No subchondral edema or erosive changes. MISCELLANEOUS Joint:  Small nonspecific mildly complex knee joint effusion. Popliteal Fossa: The popliteus muscle and tendon are intact. Small Baker's cyst. Extensor Mechanism: Patella baja with laxity of the patellar tendon similar to recent radiographs. The visualized quadriceps tendon appears intact. There is heterogeneous T2 hyperintensity throughout the distal vastus lateralis muscle which appears mildly atrophied on the T1 weighted images. Bones: No evidence of acute fracture or dislocation. No evidence of osteomyelitis. Other: As above, T2 hyperintensity in the distal vastus lateralis muscle with surrounding subcutaneous edema. There is additional subcutaneous edema posteromedially. No focal fluid collections are identified. IMPRESSION: 1. No acute osseous findings or specific evidence of osteomyelitis. Small complex nonspecific knee joint effusion. 2. Heterogeneous T2 hyperintensity throughout the distal vastus lateralis muscle with surrounding subcutaneous edema. These findings are nonspecific and could reflect myositis, subacute denervation or a partial tear. No focal fluid  collections identified. 3. Patella baja with laxity of the patellar tendon. The visualized quadriceps tendon appears intact. 4. Tricompartmental degenerative changes, most advanced in the medial compartment. 5. Post meniscectomy changes or chronic degenerative tearing of the medial meniscus. 6. The lateral meniscus, cruciate and  collateral ligaments are intact. Electronically Signed   By: Richardean Sale M.D.   On: 04/01/2022 12:08   DG Knee Complete 4 Views Right  Result Date: 03/29/2022 CLINICAL DATA:  Chronic knee pain. EXAM: RIGHT KNEE - COMPLETE 4+ VIEW COMPARISON:  03/23/2018 for FINDINGS: Small suprapatellar joint effusion. Progressive soft tissue edema about the distal portion of the femur and knee. No acute fracture marked medial compartment joint space narrowing with marginal spur formation. Mild degenerative changes noted within the patellofemoral and lateral compartment. Sharpening of the tibial spines. Mild chondrocalcinosis. IMPRESSION: 1. Progressive soft tissue edema about the distal femur and knee. 2. Small joint effusion. 3. 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 Electronically Signed   By: Kerby Moors M.D.   On: 03/29/2022 12:59      Subjective: Patient seen and examined at bedside today.  Hemodynamically stable for discharge.  Briefly discussed discharge planning with wife at bedside in the morning  Discharge Exam: Vitals:   04/22/22 0959 04/22/22 1312  BP: 123/65 106/67  Pulse: 87 92  Resp: 17 17  Temp: 98.6 F (37 C) 98.4 F (36.9 C)  SpO2: 100% 100%   Vitals:   04/22/22 0930 04/22/22 0935 04/22/22 0959 04/22/22 1312  BP: 118/73 122/81 123/65 106/67  Pulse: 87 67 87 92  Resp: 16 14 17 17  $ Temp:   98.6 F (37 C) 98.4 F (36.9 C)  TempSrc:   Oral Oral  SpO2: 100% 100% 100% 100%  Weight:      Height:        General: Pt is alert, awake, not in acute distress Cardiovascular: RRR, S1/S2 +, no rubs, no gallops Respiratory: CTA  bilaterally, no wheezing, no rhonchi Abdominal: Soft, NT, ND, bowel sounds + Extremities: no edema, no cyanosis    The results of significant diagnostics from this hospitalization (including imaging, microbiology, ancillary and laboratory) are listed below for reference.     Microbiology: Recent Results (from the past 240 hour(s))  Surgical pcr screen     Status: None   Collection Time: 04/13/22  8:59 PM   Specimen: Nasal Mucosa; Nasal Swab  Result Value Ref Range Status   MRSA, PCR NEGATIVE NEGATIVE Final   Staphylococcus aureus NEGATIVE NEGATIVE Final    Comment: (NOTE) The Xpert SA Assay (FDA approved for NASAL specimens in patients 62 years of age and older), is one component of a comprehensive surveillance program. It is not intended to diagnose infection nor to guide or monitor treatment. Performed at [facility], [place] [address] [place], [place] [address]   Surgical pcr screen     Status: None   Collection Time: 04/17/22  6:41 AM   Specimen: Nasal Mucosa; Nasal Swab  Result Value Ref Range Status   MRSA, PCR NEGATIVE NEGATIVE Final   Staphylococcus aureus NEGATIVE NEGATIVE Final    Comment: (NOTE) The Xpert SA Assay (FDA approved for NASAL specimens in patients 42 years of age and older), is one component of a comprehensive surveillance program. It is not intended to diagnose infection nor to guide or monitor treatment. Performed at [facility], [place] [address] [place], [place] [address]      Labs: BNP (last 3 results) No results for input(s): "BNP" in the last 8760 hours. Basic Metabolic Panel: Recent Labs  Lab 04/16/22 0502 04/17/22 0423 04/18/22 0306 04/19/22 0844 04/20/22 0327 04/21/22 0316 04/22/22 0314  NA 135 135 132* 135 135 133*  --   K 4.6 4.7 5.1 4.0  3.9 4.2  --   CL 105 105 104 108 109 106  --   CO2 24 23 19* 19* 21* 22  --   GLUCOSE 81 90 148* 178* 88 85  --   BUN 29* 31* 37* 38* 34* 29*  --    CREATININE 1.45* 1.49* 1.97* 1.60* 1.40* 1.29*  --   CALCIUM 8.7* 8.8* 8.2* 7.7* 7.6* 8.4*  --   PHOS 3.2  --   --   --  2.1* 2.2* 3.2   Liver Function Tests: Recent Labs  Lab 04/16/22 0502 04/17/22 0423 04/20/22 0327 04/21/22 0316  AST  --  30  --   --   ALT  --  24  --   --   ALKPHOS  --  142*  --   --   BILITOT  --  1.0  --   --   PROT  --  6.8  --   --   ALBUMIN 2.9* 2.9* 2.3* 2.5*   No results for input(s): "LIPASE", "AMYLASE" in the last 168 hours. No results for input(s): "AMMONIA" in the last 168 hours. CBC: Recent Labs  Lab 04/16/22 0502 04/17/22 0423 04/18/22 0306 04/18/22 1346 04/19/22 0844 04/20/22 0327 04/21/22 0316 04/22/22 0314  WBC 6.4 7.5 9.2  --  8.3 6.4 6.4 5.1  NEUTROABS 4.4 5.1 7.6  --   --   --   --  3.1  HGB 8.5* 9.9* 8.0* 7.2* 9.2* 7.9* 8.8* 8.3*  HCT 26.0* 30.2* 25.1* 22.3* 28.1* 23.8* 26.2* 24.8*  MCV 94.2 91.5 94.7  --  92.7 92.2 91.3 91.5  PLT 209 208 161  --  119* 116* 144* 143*   Cardiac Enzymes: No results for input(s): "CKTOTAL", "CKMB", "CKMBINDEX", "TROPONINI" in the last 168 hours. BNP: Invalid input(s): "POCBNP" CBG: No results for input(s): "GLUCAP" in the last 168 hours. D-Dimer No results for input(s): "DDIMER" in the last 72 hours. Hgb A1c No results for input(s): "HGBA1C" in the last 72 hours. Lipid Profile No results for input(s): "CHOL", "HDL", "LDLCALC", "TRIG", "CHOLHDL", "LDLDIRECT" in the last 72 hours. Thyroid function studies No results for input(s): "TSH", "T4TOTAL", "T3FREE", "THYROIDAB" in the last 72 hours.  Invalid input(s): "FREET3" Anemia work up No results for input(s): "VITAMINB12", "FOLATE", "FERRITIN", "TIBC", "IRON", "RETICCTPCT" in the last 72 hours. Urinalysis    Component Value Date/Time   COLORURINE YELLOW 12/26/2015 [place] 12/26/2015 1055   LABSPEC 1.012 12/26/2015 1055   PHURINE 5.5 12/26/2015 1055   GLUCOSEU NEGATIVE 12/26/2015 1055   HGBUR NEGATIVE 12/26/2015 1055    BILIRUBINUR NEGATIVE 12/26/2015 1055   KETONESUR NEGATIVE 12/26/2015 1055   PROTEINUR NEGATIVE 12/26/2015 1055   NITRITE NEGATIVE 12/26/2015 1055   LEUKOCYTESUR NEGATIVE 12/26/2015 1055   Sepsis Labs Recent Labs  Lab 04/19/22 0844 04/20/22 0327 04/21/22 0316 04/22/22 0314  WBC 8.3 6.4 6.4 5.1   Microbiology Recent Results (from the past 240 hour(s))  Surgical pcr screen     Status: None   Collection Time: 04/13/22  8:59 PM   Specimen: Nasal Mucosa; Nasal Swab  Result Value Ref Range Status   MRSA, PCR NEGATIVE NEGATIVE Final   Staphylococcus aureus NEGATIVE NEGATIVE Final    Comment: (NOTE) The Xpert SA Assay (FDA approved for NASAL specimens in patients 70 years of age and older), is one component of a comprehensive surveillance program. It is not intended to diagnose infection nor to guide or monitor treatment. Performed at [facility], [place]  [address] [place], [place] [address]   Surgical pcr screen     Status: None   Collection Time: 04/17/22  6:41 AM   Specimen: Nasal Mucosa; Nasal Swab  Result Value Ref Range Status   MRSA, PCR NEGATIVE NEGATIVE Final   Staphylococcus aureus NEGATIVE NEGATIVE Final    Comment: (NOTE) The Xpert SA Assay (FDA approved for NASAL specimens in patients 62 years of age and older), is one component of a comprehensive surveillance program. It is not intended to diagnose infection nor to guide or monitor treatment. Performed at [facility], [place] [address], [place], [place] [address]     Please note: You were cared for by a hospitalist during your hospital stay. Once you are discharged, your primary care physician will handle any further medical issues. Please note that NO REFILLS for any discharge medications will be authorized once you are discharged, as it is imperative that you return to your primary care physician (or establish a relationship with a primary care physician if you do not have  one) for your post hospital discharge needs so that they can reassess your need for medications and monitor your lab values.    Time coordinating discharge: 40 minutes  SIGNED:   Shelly Coss, MD  [facility] 04/22/2022, 3:33 PM Pager [phone]  If 7PM-7AM, please contact night-coverage [email or web address] Password [email or web address]

## 2022-04-22 NOTE — Plan of Care (Signed)

## 2022-04-22 NOTE — Plan of Care (Signed)
Plan of care was reviewed and discussed.  

## 2022-04-22 NOTE — Consult Note (Signed)
Chief Complaint: Patient was seen in consultation today for multiple myeloma at the request of Dr. Irene Limbo  Referring Physician(s): Dr. Irene Limbo  Supervising Physician: Markus Daft  Patient Status: [facility] - In-pt  History of Present Illness: Frank Hernandez is a 82 y.o. male from [facility] 04/13/2022 to ED c/o nontraumatic right hip pain. He was d/c to SNF s/p left intramedullary intertrochanteric nailing for hip fracture April 2023 and  previously hospitalized 03/29/22-04/05/22 for right right knee pain and effusion. He was hospitalized after workup in the ED revealed subtrochanteric right femur fracture with concern for lucency in the right femoral neck and intertrochanteric region suggesting lytic lesion and pathologic fracture. 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 Pt was referred by Dr. Irene Limbo to IR for bone marrow biopsy.   Pt denies fever, chills, SOB, CP, abd pain, N/V, dizziness or HA.  He endorses loss of appetite, fatigue and weakness.  He is NPO per order.    Past Medical History:  Diagnosis Date   Bleeding from the nose    Hypertension    Pre-diabetes     Past Surgical History:  Procedure Laterality Date   COLONOSCOPY     INTRAMEDULLARY (IM) NAIL INTERTROCHANTERIC Left 06/11/2020   Procedure: INTRAMEDULLARY (IM) NAIL INTERTROCHANTRIC;  Surgeon: Shona Needles, MD;  Location: [place];  Service: Orthopedics;  Laterality: Left;   INTRAMEDULLARY (IM) NAIL INTERTROCHANTERIC Right 04/17/2022   Procedure: INTRAMEDULLARY (IM) NAIL INTERTROCHANTERIC;  Surgeon: Callie Fielding, MD;  Location: [facility];  Service: Orthopedics;  Laterality: Right;   KNEE ARTHROSCOPY Bilateral    POLYPECTOMY      Allergies: Patient has no known allergies.  Medications: Prior to Admission medications   Medication Sig Start Date End Date Taking? Authorizing Provider  amLODipine (NORVASC) 10 MG tablet Take 10 mg by mouth daily.   Yes  [provider]  aspirin EC 81 MG tablet Take 1 tablet (81 mg total) by mouth 2 (two) times daily. Swallow whole. 04/19/22 05/31/22 Yes Callie Fielding, MD  atorvastatin (LIPITOR) 40 MG tablet Take 40 mg by mouth every evening.   Yes [provider]  DOXAZOSIN MESYLATE PO Take 2 mg by mouth every evening.   Yes [provider]  feeding supplement (ENSURE ENLIVE / ENSURE PLUS) LIQD Take 237 mLs by mouth 2 (two) times daily between meals. Patient taking differently: Take 237 mLs by mouth daily. May use Boost 04/03/22  Yes Mercy Riding, MD  ferrous sulfate 325 (65 FE) MG EC tablet Take 1 tablet (325 mg total) by mouth 2 (two) times daily. 04/03/22 09/30/22 Yes Mercy Riding, MD  HYDROcodone-acetaminophen (NORCO/VICODIN) 5-325 MG tablet Take 1 tablet by mouth every 6 (six) hours as needed for moderate pain.   Yes [provider]  Multiple Vitamins-Iron (MULTIVITAMINS WITH IRON) TABS tablet Take 1 tablet by mouth daily. 04/04/22  Yes Mercy Riding, MD  oxyCODONE (ROXICODONE) 5 MG immediate release tablet Take 1-2 tablets (5-10 mg total) by mouth every 4 (four) hours as needed for severe pain or moderate pain. 04/19/22  Yes Callie Fielding, MD  polyethylene glycol (MIRALAX) 17 g packet Take 17 g by mouth daily. 04/19/22  Yes Callie Fielding, MD  senna-docusate (SENOKOT-S) 8.6-50 MG tablet Take 1 tablet by mouth at bedtime as needed for mild constipation. 04/01/22  Yes Mercy Riding, MD  acetaminophen (TYLENOL) 325 MG tablet Take 2 tablets (650 mg total) by mouth every  6 (six) hours as needed for mild pain or headache (or Fever >/= 101). Patient not taking: Reported on 04/13/2022 04/01/22   Mercy Riding, MD  acetaminophen (TYLENOL) 500 MG tablet Take 2 tablets (1,000 mg total) by mouth 3 (three) times daily for 21 days. 04/19/22 05/10/22  Callie Fielding, MD  senna-docusate (SENOKOT-S) 8.6-50 MG tablet Take 1 tablet by mouth 2 (two) times daily for 14 days. 04/19/22 05/03/22  Callie Fielding, MD  traMADol (ULTRAM) 50 MG tablet Take 1 tablet (50 mg total) by mouth every 8 (eight) hours as needed for moderate pain. Patient not taking: Reported on 04/13/2022 04/01/22   Mercy Riding, MD     Family History  Problem Relation Age of Onset   Colon cancer Neg Hx     Social History   Socioeconomic History   Marital status: Single    Spouse name: Not on file   Number of children: Not on file   Years of education: Not on file   Highest education level: Not on file  Occupational History   Not on file  Tobacco Use   Smoking status: Never   Smokeless tobacco: Never  Vaping Use   Vaping Use: Never used  Substance and Sexual Activity   Alcohol use: No    Alcohol/week: 0.0 standard drinks of alcohol   Drug use: No   Sexual activity: Not on file  Other Topics Concern   Not on file  Social History Narrative   Not on file   Social Determinants of Health   Financial Resource Strain: Not on file  Food Insecurity: Unknown (04/17/2022)   Hunger Vital Sign    Worried About Running Out of Food in the Last Year: Patient refused    Ran Out of Food in the Last Year: Patient refused  Transportation Needs: Unknown (04/17/2022)   PRAPARE - Hydrologist (Medical): Patient refused    Lack of Transportation (Non-Medical): Patient refused  Physical Activity: Not on file  Stress: Not on file  Social Connections: Not on file     Review of Systems: A 12 point ROS discussed and pertinent positives are indicated in the HPI above.  All other systems are negative.  Review of Systems  Constitutional:  Positive for appetite change and fatigue. Negative for chills and fever.  Respiratory:  Negative for shortness of breath.   Cardiovascular:  Negative for chest pain.  Gastrointestinal:  Negative for abdominal pain, nausea and vomiting.  Neurological:  Positive for weakness. Negative for dizziness and headaches.    Vital Signs: BP 129/72 (BP Location: Left  Arm)   Pulse 79   Temp 98.6 F (37 C) (Oral)   Resp 18   Ht 5' 9.5" (1.765 m)   Wt 207 lb 3.7 oz (94 kg)   SpO2 100%   BMI 30.16 kg/m     Physical Exam Vitals reviewed.  Constitutional:      General: He is not in acute distress.    Appearance: Normal appearance. He is not ill-appearing.  HENT:     Head: Normocephalic and atraumatic.     Mouth/Throat:     Mouth: Mucous membranes are dry.     Pharynx: Oropharynx is clear.  Eyes:     Extraocular Movements: Extraocular movements intact.     Pupils: Pupils are equal, round, and reactive to light.  Cardiovascular:     Rate and Rhythm: Normal rate and regular rhythm.     Pulses:  Normal pulses.     Heart sounds: Normal heart sounds. No murmur heard. Pulmonary:     Effort: Pulmonary effort is normal. No respiratory distress.     Breath sounds: Normal breath sounds.  Abdominal:     General: Bowel sounds are normal. There is no distension.     Palpations: Abdomen is soft.     Tenderness: There is no abdominal tenderness. There is no guarding.  Musculoskeletal:     Right lower leg: No edema.     Left lower leg: No edema.  Skin:    General: Skin is warm and dry.  Neurological:     Mental Status: He is alert and oriented to person, place, and time.  Psychiatric:        Mood and Affect: Mood normal.        Behavior: Behavior normal.        Thought Content: Thought content normal.        Judgment: Judgment normal.     Imaging: DG FEMUR, MIN 2 VIEWS RIGHT  Result Date: 04/17/2022 CLINICAL DATA:  Postop EXAM: RIGHT FEMUR 2 VIEWS COMPARISON:  04/13/2022. FINDINGS: Recent placement intramedullary rod in the femur and compression screw fixation of the right femoral neck with grossly anatomic alignment. No evidence of hardware failure. IMPRESSION: Unremarkable post op changes ORIF right femoral neck and femur. Electronically Signed   By: Sammie Bench M.D.   On: 04/17/2022 13:59   DG HIP UNILAT WITH PELVIS 1V RIGHT  Result  Date: 04/17/2022 CLINICAL DATA:  Fluoro guidance provided EXAM: DG HIP (WITH OR WITHOUT PELVIS) 1V RIGHT FINDINGS: Dose: 40.2 mGy Fluoro time 192s IMPRESSION: C-arm fluoro guidance provided. Electronically Signed   By: Sammie Bench M.D.   On: 04/17/2022 11:04   DG C-Arm 1-60 Min-No Report  Result Date: 04/17/2022 Fluoroscopy was utilized by the requesting physician.  No radiographic interpretation.   DG C-Arm 1-60 Min-No Report  Result Date: 04/17/2022 Fluoroscopy was utilized by the requesting physician.  No radiographic interpretation.   DG C-Arm 1-60 Min-No Report  Result Date: 04/17/2022 Fluoroscopy was utilized by the requesting physician.  No radiographic interpretation.   CT CHEST ABDOMEN PELVIS WO CONTRAST  Result Date: 04/14/2022 CLINICAL DATA:  Suspected pathologic right hip fracture. EXAM: CT CHEST, ABDOMEN AND PELVIS WITHOUT CONTRAST TECHNIQUE: Multidetector CT imaging of the chest, abdomen and pelvis was performed following the standard protocol without IV contrast. RADIATION DOSE REDUCTION: This exam was performed according to the departmental dose-optimization program which includes automated exposure control, adjustment of the mA and/or kV according to patient size and/or use of iterative reconstruction technique. COMPARISON:  None Available. FINDINGS: CT CHEST FINDINGS Cardiovascular: The heart is normal in size. No pericardial effusion. There is tortuosity, ectasia and calcification of the thoracic aorta. Three-vessel coronary artery calcifications are noted. Mediastinum/Nodes: No mediastinal or hilar mass or lymphadenopathy. The esophagus is grossly normal. Thyroid gland is unremarkable. Lungs/Pleura: No pulmonary mass or pulmonary nodules. No acute pulmonary process. No pleural effusions or pleural nodules. The central tracheobronchial tree is unremarkable. Musculoskeletal: Underline ostial crow CIS. This makes detection of lytic lesions somewhat difficult. I do not see any  obvious sternal or rib lesions. Could not exclude lytic lesions involving the T9 and T10 vertebral bodies. Asymmetric right-sided gynecomastia noted without obvious breast mass. CT ABDOMEN PELVIS FINDINGS Hepatobiliary: Small hepatic cysts are unchanged since prior CT scan. No worrisome hepatic lesions without contrast. No intrahepatic biliary dilatation. The gallbladder is unremarkable. No common bile duct dilatation. Pancreas:  Age related pancreatic atrophy but no mass or acute inflammation. Spleen: Normal size.  No focal lesions. Adrenals/Urinary Tract: No adrenal gland lesions. No worrisome renal lesions without contrast. Stable simple bilateral renal cysts not requiring any further evaluation or follow-up. The bladder is grossly normal. Part of the bladder is in a right inguinal hernia. This is a stable finding. Stomach/Bowel: The stomach, duodenum, small and colon are grossly normal. No acute inflammatory process, mass lesions or obstructive findings. Diverticulum noted at the gastric cardia. Descending and sigmoid colon diverticulosis noted along with moderate stool in the rectum. Vascular/Lymphatic: Scattered atherosclerotic calcification involving the aorta and iliac arteries but no aneurysm. No mesenteric or retroperitoneal mass or adenopathy. Reproductive: The prostate gland and seminal vesicles are grossly. Other: There is a large right inguinal hernia containing small bowel loops and part of the bladder. Musculoskeletal: Subacute displaced right hip fracture again noted with areas of attempted callus formation and heterotopic ossification. There is a cortical defect involving the right inferior pubic ramus and also the left acetabulum consistent with destructive lytic bone disease. There is also a lytic lesion involving the posterior aspect of the L2 vertebral body with cortical destruction and a small amount of tumor in the canal. IMPRESSION: 1. Subacute displaced right hip fracture with areas of  attempted callus formation and heterotopic ossification. 2. 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 Could not exclude lytic lesions involving the T9 and T10 vertebral bodies. Other lytic lesions could be difficult to identify because of underlying osteoporosis. Myeloma could be a consideration. PET-CT may be helpful for further evaluation and to help identify a suitable biopsy site. 3. No obvious primary neoplastic process is identified without contrast. 4. Large right inguinal hernia containing small bowel loops and part of the bladder. 5. No acute pulmonary findings or pulmonary nodules. 6. No acute abdominal/pelvic findings, mass lesions or adenopathy. 7. Aortic atherosclerosis. 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 Electronically Signed   By: Marijo Sanes M.D.   On: 04/14/2022 13:07   CT Hip Right Wo Contrast  Result Date: 04/13/2022 CLINICAL DATA:  Right femur fracture. EXAM: CT OF THE RIGHT HIP WITHOUT CONTRAST TECHNIQUE: Multidetector CT imaging of the right hip was performed according to the standard protocol. Multiplanar CT image reconstructions were also generated. RADIATION DOSE REDUCTION: This exam was performed according to the departmental dose-optimization program which includes automated exposure control, adjustment of the mA and/or kV according to patient size and/or use of iterative reconstruction technique. COMPARISON:  Radiographs of same day. FINDINGS: Severely displaced fracture is seen involving the intertrochanteric region of the proximal right femur. There is seen lucency involving the right femoral neck which extends into trochanteric region and possible lytic destruction or malignancy cannot be excluded. There is a cortical defect involving the right inferior pubic ramus concerning for possible lytic lesion. Large right inguinal hernia is noted which appears to contain small bowel and a  portion of the urinary bladder. IMPRESSION: Severely displaced fracture is seen involving the intertrochanteric region of the proximal right femur. There does appear to be lucency in the right femoral neck and intertrochanteric region suggesting the possibility of lytic lesion and pathologic fracture. There does appear to be cortical defect involving the right inferior pubic ramus suggesting possible metastatic lesion in this area. MRI may be performed for further evaluation. Also noted is large right inguinal hernia which appears to contain small bowel and a portion of the urinary bladder.  Electronically Signed   By: Marijo Conception M.D.   On: 04/13/2022 15:50   DG Femur 1V Right  Result Date: 04/13/2022 CLINICAL DATA:  Right leg pain. EXAM: RIGHT FEMUR 1 VIEW COMPARISON:  Right hip and knee radiographs today. FINDINGS: An acute, displaced subtrochanteric fracture of the right femur is again seen. The more distal femur appears intact. The knee is reported separately. Soft tissue swelling is noted. IMPRESSION: Subtrochanteric fracture of the right femur. Electronically Signed   By: Logan Bores M.D.   On: 04/13/2022 11:43   DG Knee Complete 4 Views Right  Result Date: 04/13/2022 CLINICAL DATA:  Right leg pain. EXAM: RIGHT KNEE - COMPLETE 4+ VIEW COMPARISON:  Right knee radiographs 03/29/2022 FINDINGS: No acute fracture, dislocation, or sizable residual knee joint effusion is identified. Tricompartmental osteoarthrosis is similar to the prior study, including severe medial compartment joint space narrowing. Soft tissue swelling about the knee has decreased. IMPRESSION: 1. Decreased soft tissue swelling and no sizable residual visible joint effusion. 2. No acute osseous abnormality. 3. Severe medial compartment osteoarthrosis. Electronically Signed   By: Logan Bores M.D.   On: 04/13/2022 11:40   DG Hip Unilat With Pelvis 2-3 Views Right  Result Date: 04/13/2022 CLINICAL DATA:  Right leg pain. EXAM: DG HIP  (WITH OR WITHOUT PELVIS) 2-3V RIGHT COMPARISON:  None Available. FINDINGS: There is an acute, displaced subtrochanteric fracture of the right femur. The femoral head remains approximated with the acetabulum on the AP radiographs. Cross-table lateral radiographs are limited by superimposed tissues and underpenetration. Sequelae of previous left femur ORIF are partially visualized. IMPRESSION: Subtrochanteric fracture of the right femur. Electronically Signed   By: Logan Bores M.D.   On: 04/13/2022 11:35   MR KNEE RIGHT WO CONTRAST  Result Date: 04/01/2022 CLINICAL DATA:  Continued right knee pain and swelling for 7 days with limited weight-bearing. Soft tissue infection suspected. EXAM: MRI OF THE RIGHT KNEE WITHOUT CONTRAST TECHNIQUE: Multiplanar, multisequence MR imaging of the knee was performed. No intravenous contrast was administered. COMPARISON:  Radiographs 03/29/2022 and 03/23/2022. FINDINGS: Despite efforts by the technologist and patient, mild motion artifact is present on today's exam and could not be eliminated. This reduces exam sensitivity and specificity. MENISCI Medial meniscus: The medial meniscus is diffusely diminutive with free edge blunting and peripheral extrusion from the joint space. Medical record indicates previous knee arthroscopy, and these findings could be postsurgical, but are suspicious for diffuse degenerative tearing. No centrally displaced meniscal fragments identified. Lateral meniscus:  Intact with normal morphology. LIGAMENTS Cruciates:  Intact. Collaterals: Intact. There is medial buckling of the MCL related to the meniscal extrusion and marginal osteophytes. CARTILAGE Patellofemoral: Moderate patellofemoral degenerative changes with chondral thinning, surface irregularity and osteophytes. 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 No subchondral edema or erosive changes. Lateral: Mild chondral thinning and  peripheral osteophyte formation. No subchondral edema or erosive changes. MISCELLANEOUS Joint:  Small nonspecific mildly complex knee joint effusion. Popliteal Fossa: The popliteus muscle and tendon are intact. Small Baker's cyst. Extensor Mechanism: Patella baja with laxity of the patellar tendon similar to recent radiographs. The visualized quadriceps tendon appears intact. There is heterogeneous T2 hyperintensity throughout the distal vastus lateralis muscle which appears mildly atrophied on the T1 weighted images. Bones: No evidence of acute fracture or dislocation. No evidence of osteomyelitis. Other: As above, T2 hyperintensity in the distal vastus lateralis muscle with surrounding subcutaneous edema. There is additional subcutaneous edema posteromedially. No focal fluid collections  are identified. IMPRESSION: 1. No acute osseous findings or specific evidence of osteomyelitis. Small complex nonspecific knee joint effusion. 2. Heterogeneous T2 hyperintensity throughout the distal vastus lateralis muscle with surrounding subcutaneous edema. These findings are nonspecific and could reflect myositis, subacute denervation or a partial tear. No focal fluid collections identified. 3. Patella baja with laxity of the patellar tendon. The visualized quadriceps tendon appears intact. 4. Tricompartmental degenerative changes, most advanced in the medial compartment. 5. Post meniscectomy changes or chronic degenerative tearing of the medial meniscus. 6. The lateral meniscus, cruciate and collateral ligaments are intact. Electronically Signed   By: Richardean Sale M.D.   On: 04/01/2022 12:08   DG Knee Complete 4 Views Right  Result Date: 03/29/2022 CLINICAL DATA:  Chronic knee pain. EXAM: RIGHT KNEE - COMPLETE 4+ VIEW COMPARISON:  03/23/2018 for FINDINGS: Small suprapatellar joint effusion. Progressive soft tissue edema about the distal portion of the femur and knee. No acute fracture marked medial compartment joint  space narrowing with marginal spur formation. Mild degenerative changes noted within the patellofemoral and lateral compartment. Sharpening of the tibial spines. Mild chondrocalcinosis. IMPRESSION: 1. Progressive soft tissue edema about the distal femur and knee. 2. Small joint effusion. 3. 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 Electronically Signed   By: Kerby Moors M.D.   On: 03/29/2022 12:59   VAS [place] LOWER EXTREMITY VENOUS (DVT)  Result Date: 03/23/2022  Lower Venous DVT Study Patient Name:  DALEON BUZBY  Date of Exam:   03/23/2022 Medical Rec #: [id number]            Accession #:    [id number] Date of Birth: Sep 05, 1940             Patient Gender: M Patient Age:   5 years Exam Location:  [facility] Procedure:      VAS [place] LOWER EXTREMITY VENOUS (DVT) Referring Phys: Marda Stalker --------------------------------------------------------------------------------  Indications: Knee pain.  Limitations: Poor ultrasound/tissue interface. Performing Technologist: Rogelia Rohrer RVT, RDMS  Examination Guidelines: A complete evaluation includes B-mode imaging, spectral Doppler, color Doppler, and power Doppler as needed of all accessible portions of each vessel. Bilateral testing is considered an integral part of a complete examination. Limited examinations for reoccurring indications may be performed as noted. The reflux portion of the exam is performed with the patient in reverse Trendelenburg.  +---------+---------------+---------+-----------+----------+--------------+ RIGHT    CompressibilityPhasicitySpontaneityPropertiesThrombus Aging +---------+---------------+---------+-----------+----------+--------------+ CFV      Full           Yes      Yes                                 +---------+---------------+---------+-----------+----------+--------------+ SFJ      Full                                                         +---------+---------------+---------+-----------+----------+--------------+ FV Prox  Full           Yes      Yes                                 +---------+---------------+---------+-----------+----------+--------------+ FV Mid   Full  Yes      Yes                                 +---------+---------------+---------+-----------+----------+--------------+ FV DistalFull           Yes      Yes                                 +---------+---------------+---------+-----------+----------+--------------+ PFV      Full                                                        +---------+---------------+---------+-----------+----------+--------------+ POP      Full           Yes      Yes                                 +---------+---------------+---------+-----------+----------+--------------+ PTV      Full                                                        +---------+---------------+---------+-----------+----------+--------------+ PERO     Full                                                        +---------+---------------+---------+-----------+----------+--------------+   +----+---------------+---------+-----------+----------+--------------+ LEFTCompressibilityPhasicitySpontaneityPropertiesThrombus Aging +----+---------------+---------+-----------+----------+--------------+ CFV Full           Yes      Yes                                 +----+---------------+---------+-----------+----------+--------------+     Summary: RIGHT: - No evidence of common femoral vein obstruction. - No cystic structure found in the popliteal fossa. Subcutaneous edema seen in area of popliteal fossa and calf.  LEFT: - No evidence of common femoral vein obstruction.  *See table(s) above for measurements and observations. Electronically signed by Deitra Mayo MD on 03/23/2022 at 4:22:29 PM.    Final    DG Knee Complete 4 Views Right  Result Date: 03/23/2022 CLINICAL  DATA:  Lateral left knee pain starting last night. Radiates up left thigh. EXAM: RIGHT KNEE - COMPLETE 4+ VIEW COMPARISON:  None Available. FINDINGS: There is diffuse decreased bone mineralization. Tiny joint effusion. Moderate patellofemoral joint space narrowing and peripheral osteophytosis. Minimal chronic enthesopathic change at the quadriceps insertion on the patella. Moderate to severe medial and mild lateral compartment joint space narrowing and peripheral osteophytosis. No acute fracture is seen. 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 Electronically Signed   By: Yvonne Kendall M.D.   On: 03/23/2022 08:42    Labs:  CBC: Recent Labs    04/19/22 0844 04/20/22 0327 04/21/22 0316 04/22/22 0314  WBC 8.3 6.4 6.4 5.1  HGB 9.2*  7.9* 8.8* 8.3*  HCT 28.1* 23.8* 26.2* 24.8*  PLT 119* 116* 144* 143*    COAGS: Recent Labs    04/13/22 1101  INR 1.1    BMP: Recent Labs    04/18/22 0306 04/19/22 0844 04/20/22 0327 04/21/22 0316  NA 132* 135 135 133*  K 5.1 4.0 3.9 4.2  CL 104 108 109 106  CO2 19* 19* 21* 22  GLUCOSE 148* 178* 88 85  BUN 37* 38* 34* 29*  CALCIUM 8.2* 7.7* 7.6* 8.4*  CREATININE 1.97* 1.60* 1.40* 1.29*  GFRNONAA 34* 43* 51* 56*    LIVER FUNCTION TESTS: Recent Labs    04/16/22 0502 04/17/22 0423 04/20/22 0327 04/21/22 0316  BILITOT  --  1.0  --   --   AST  --  30  --   --   ALT  --  24  --   --   ALKPHOS  --  142*  --   --   PROT  --  6.8  --   --   ALBUMIN 2.9* 2.9* 2.3* 2.5*    TUMOR MARKERS: No results for input(s): "AFPTM", "CEA", "CA199", "CHROMGRNA" in the last 8760 hours.  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   Pt resting in bed.  He is A&O, calm and pleasant.  He is in no distress.   Risks and benefits of bone marrow  biopsy and aspiration with moderate sedation was discussed with the patient and/or patient's family including, but not limited to bleeding, infection, damage to adjacent structures or low yield requiring additional tests.  All of the questions were answered and there is agreement to proceed.  Consent signed and in chart.  Thank you for this interesting consult.  I greatly enjoyed meeting Frank Hernandez and look forward to participating in their care.  A copy of this report was sent to the requesting provider on this date.  Electronically Signed: Tyson Alias, NP 04/22/2022, 7:45 AM   I spent a total of 20 minutes in face to face in clinical consultation, greater than 50% of which was counseling/coordinating care for multiple myeloma.

## 2022-04-22 NOTE — Progress Notes (Signed)
Orthopedic Surgery Progress Note   Assessment: Patient is a 82 y.o. male with right pertrochanteric femur fracture s/p biopsy and CMN   Plan: -Operative plans: complete -Follow up intra-op pathology (plasmacytoma vs myeloma) -No chemo or radiation until wounds have healed (~2-3 weeks) -Regular diet -DVT ppx: per primary -Weight bearing status: WBAT RLE -PT evaluate and treat -Pain control -Dispo: per primary  ___________________________________________________________________________  Subjective: No acute events overnight. Feels his hip pain continues to improve. Feeling better about putting weight on that leg. Denies paresthesias and numbness.   Physical Exam:  General: no acute distress, appears stated age Neurologic: alert, answering questions appropriately, following commands Respiratory: unlabored breathing on room air, symmetric chest rise Psychiatric: appropriate affect, normal cadence to speech Skin: proximal dressings with serosanguinous drainage, distal dressing dry, dressings intact  MSK:   -Right lower extremity Fires quadriceps, hamstrings, tibialis anterior, gastrocnemius and soleus, extensor hallucis longus Does not fire hip flexors due to pain Plantarflexes and dorsiflexes toes Sensation intact to light touch in sural, saphenous, tibial, deep peroneal, and superficial peroneal nerve distributions Foot warm and well perfused   Patient name: Frank Hernandez Patient MRN: [id number] Date: 04/22/22

## 2022-04-22 NOTE — Care Management Important Message (Signed)
Important Message  Patient Details IM Letter given. Name: Frank Hernandez MRN: [id number] Date of Birth: [birth year]   Medicare Important Message Given:  Yes     Kerin Salen 04/22/2022, 1:16 PM

## 2022-04-22 NOTE — Progress Notes (Signed)
START ON PATHWAY REGIMEN - Multiple Myeloma and Other Plasma Cell Dyscrasias     Cycles 1 and 2: A cycle is every 28 days:     Lenalidomide      Dexamethasone      Daratumumab and hyaluronidase-fihj    Cycles 3 through 6: A cycle is every 28 days:     Lenalidomide      Dexamethasone      Daratumumab and hyaluronidase-fihj    Cycles 7 and beyond: A cycle is every 28 days:     Lenalidomide      Dexamethasone      Daratumumab and hyaluronidase-fihj   **Always confirm dose/schedule in your pharmacy ordering system**  Patient Characteristics: Multiple Myeloma, Newly Diagnosed, Transplant Ineligible or Refused, Unknown or Awaiting Test Results Disease Classification: Multiple Myeloma R-ISS Staging: Unknown Therapeutic Status: Newly Diagnosed Is Patient Eligible for Transplant<= Transplant Ineligible or Refused Risk Status: Awaiting Test Results Intent of Therapy: Non-Curative / Palliative Intent, Discussed with Patient

## 2022-04-22 NOTE — TOC Progression Note (Signed)
Transition of Care [facility]) - Progression Note    Patient Details  Name: Frank Hernandez MRN: [id number] Date of Birth: 1940-07-26  Transition of Care [facility]) CM/SW Contact  Servando Snare, [place] Phone Number: 04/22/2022, 9:31 AM  Clinical Narrative:   Not medically stable. Patient from [facility]. Will need new authorization when medically stable to return.     Expected Discharge Plan: [place] Barriers to Discharge: Continued Medical Work up  Expected Discharge Plan and Services       Living arrangements for the past 2 months: Single Family Home                                       Social Determinants of Health (SDOH) Interventions SDOH Screenings   Food Insecurity: Unknown (04/17/2022)  Housing: Low Risk  (04/17/2022)  Transportation Needs: Unknown (04/17/2022)  Utilities: Unknown (04/17/2022)  Tobacco Use: Low Risk  (04/22/2022)    Readmission Risk Interventions    04/14/2022    1:48 PM 04/02/2022   11:43 AM  Readmission Risk Prevention Plan  Post Dischage Appt  Complete  Medication Screening  Complete  Transportation Screening Complete Complete  PCP or Specialist Appt within 5-7 Days Complete   Home Care Screening Complete   Medication Review (RN CM) Complete

## 2022-04-22 NOTE — Procedures (Signed)
Interventional Radiology Procedure:   Indications: Myeloma work up.  Procedure: CT guided bone marrow biopsy  Findings: 2 aspirates and 1 core from right ilium  Complications: None     EBL: Minimal, less than 10 ml  Plan: Bedrest 2 hours and then resume prior activity orders   Shirel Mallis R. Anselm Pancoast, MD  Pager: [phone]

## 2022-04-22 NOTE — Progress Notes (Signed)
  Echocardiogram 2D Echocardiogram has been performed.  Lana Fish 04/22/2022, 3:14 PM

## 2022-04-22 NOTE — TOC Transition Note (Signed)
Transition of Care [facility]) - CM/SW Discharge Note   Patient Details  Name: Frank Hernandez MRN: [id number] Date of Birth: [birth year]  Transition of Care (TOC) CM/SW Contact:  Joaquin Courts, RN Phone Number: 04/22/2022, 3:34 PM   Clinical Narrative:     Discharge summary sent to [facility], patient will discharge to room 126-A.  [facility] called for transport.    Barriers to Discharge: No Barriers Identified   Patient Goals and CMS Choice      Discharge Placement                    Name of family member notified: [name], Spouse (at bedside) Patient and family notified of of transfer: 04/22/22  Discharge Plan and Services Additional resources added to the After Visit Summary for                            [facility] Arranged: NA [place] Agency: NA        Social Determinants of Health ([place]) Interventions SDOH Screenings   Food Insecurity: Unknown (04/17/2022)  Housing: Low Risk  (04/17/2022)  Transportation Needs: Unknown (04/17/2022)  Utilities: Unknown (04/17/2022)  Tobacco Use: Low Risk  (04/22/2022)     Readmission Risk Interventions    04/14/2022    1:48 PM 04/02/2022   11:43 AM  Readmission Risk Prevention Plan  Post Dischage Appt  Complete  Medication Screening  Complete  Transportation Screening Complete Complete  PCP or Specialist Appt within 5-7 Days Complete   Home Care Screening Complete   Medication Review (RN CM) Complete

## 2022-04-22 NOTE — Progress Notes (Signed)
Physical Therapy Treatment Patient Details Name: Frank Hernandez MRN: [id number] DOB: 28-Nov-1940 Today's Date: 04/22/2022   History of Present Illness Patient is a 82 year old male with recent hospitalization for R knee pain1/29/2024-04/05/2022 whereby he underwent aspiration and steroid injection felt secondary to osteoarthritis, d/c to SNF. Pt presented  back to [facility] with incr RLE pain. xray right/pelvis showed subtrochanteric fracture of the right femur, right knee with decreased soft tissue swelling no sizable residual  joint effusion, no acute osseous abnormality, severe medial compartment osteoarthrosis. CT right hip done with concerns for pathologic fracture, lytic lesions noted.  orthopedics consulted, ordered a CT chest abdomen and pelvis; pt underwent IM nail R femur d/t pathological fx +Bx on 04/17/22  -Hematology/oncology consultation pending. PMH: hypertension hyperlipidemia CKD stage IIIa, prediabetes, s/p L intramedullary intertrochanteric nailing for hip fracture 06/28/2020    PT Comments    POD # 5 General Comments: AxO x 3 cooperative.  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 Assisted OOB to amb required increased time and effort.  General bed mobility comments: demonstarted and instructed how to use a belt to self asisst LE OOB which required increased time. General transfer comment: pt was able to rise from elevated bed Mod Assist + 1 with 25% VC's on proper tech.  Poor flexed posture.  Able to tolerate WBing R LE. General Gait Details: VERY limited amb distance of 3 feet from bed to recliner with very short shuffled steps and difficulty advancing R LE.  Distance limited by fatigue and effort.  Recliner pulled up to pt from behind, "I got to sit". Returned to room in recliner.  Performed a few TE's followed by ICE. Pt plans to return to [facility].   Recommendations for follow up therapy are one component of a multi-disciplinary discharge planning process, led by  the attending physician.  Recommendations may be updated based on patient status, additional functional criteria and insurance authorization.  Follow Up Recommendations  Skilled nursing-short term rehab (<3 hours/day) Can patient physically be transported by private vehicle: No   Assistance Recommended at Discharge Frequent or constant Supervision/Assistance  Patient can return home with the following A lot of help with walking and/or transfers;A lot of help with bathing/dressing/bathroom;Assist for transportation;Help with stairs or ramp for entrance   Equipment Recommendations  None recommended by PT    Recommendations for Other Services       Precautions / Restrictions Precautions Precautions: Fall Precaution Comments: denies falls in past 6 months Restrictions Weight Bearing Restrictions: No RLE Weight Bearing: Weight bearing as tolerated     Mobility  Bed Mobility Overal bed mobility: Needs Assistance Bed Mobility: Supine to Sit     Supine to sit: Min assist     General bed mobility comments: demonstarted and instructed how to use a belt to self asisst LE OOB which required increased time.    Transfers Overall transfer level: Needs assistance Equipment used: Rolling walker (2 wheels) Transfers: Sit to/from Stand Sit to Stand: Mod assist           General transfer comment: pt was able to rise from elevated bed Mod Assist + 1 with 25% VC's on proper tech.  Poor flexed posture.  Able to tolerate WBing R LE.    Ambulation/Gait Ambulation/Gait assistance: Mod assist Gait Distance (Feet): 3 Feet Assistive device: Rolling walker (2 wheels) Gait Pattern/deviations: Step-to pattern, Decreased stance time - right Gait velocity: decreased     General Gait Details: VERY limited amb distance  of 3 feet from bed to recliner with very short shuffled steps and difficulty advancing R LE.  Distance limited by fatigue and effort.  Recliner pulled up to pt from behind, "I got  to sit".   Stairs             Wheelchair Mobility    Modified Rankin (Stroke Patients Only)       Balance                                            Cognition Arousal/Alertness: Awake/alert Behavior During Therapy: WFL for tasks assessed/performed Overall Cognitive Status: Within Functional Limits for tasks assessed                                 General Comments: AxO x 3 cooperative.  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        Exercises  10 reps AP and knee presses    General Comments        Pertinent Vitals/Pain Pain Assessment Pain Assessment: Faces Faces Pain Scale: Hurts little more Pain Location: right hip Pain Descriptors / Indicators: Discomfort, Grimacing, Operative site guarding, Tender Pain Intervention(s): Monitored during session, Premedicated before session, Repositioned, Ice applied    Home Living                          Prior Function            PT Goals (current goals can now be found in the care plan section) Progress towards PT goals: Progressing toward goals    Frequency    Min 3X/week      PT Plan Current plan remains appropriate    Co-evaluation              AM-PAC PT "6 Clicks" Mobility   Outcome Measure  Help needed turning from your back to your side while in a flat bed without using bedrails?: A Lot Help needed moving from lying on your back to sitting on the side of a flat bed without using bedrails?: A Lot Help needed moving to and from a bed to a chair (including a wheelchair)?: A Lot Help needed standing up from a chair using your arms (e.g., wheelchair or bedside chair)?: A Lot Help needed to walk in hospital room?: A Lot Help needed climbing 3-5 steps with a railing? : Total 6 Click Score: 11    End of Session Equipment Utilized During Treatment: Gait belt Activity Tolerance: Patient tolerated treatment well;Patient limited by  pain Patient left: in chair;with call bell/phone within reach;with family/visitor present Nurse Communication: Mobility status PT Visit Diagnosis: Difficulty in walking, not elsewhere classified (R26.2);Pain Pain - Right/Left: Right Pain - part of body: Hip     Time: 1135-1200 PT Time Calculation (min) (ACUTE ONLY): 25 min  Charges:  $Gait Training: 8-22 mins $Therapeutic Activity: 8-22 mins                     {Tanita Palinkas  PTA Acute  [employer] M-F          [phone] Weekend pager [phone]

## 2022-04-23 ENCOUNTER — Telehealth: Payer: Self-pay | Admitting: Hematology

## 2022-04-23 ENCOUNTER — Telehealth: Payer: Self-pay | Admitting: Orthopedic Surgery

## 2022-04-23 ENCOUNTER — Other Ambulatory Visit: Payer: Self-pay | Admitting: Hematology

## 2022-04-23 DIAGNOSIS — I10 Essential (primary) hypertension: Secondary | ICD-10-CM | POA: Diagnosis not present

## 2022-04-23 DIAGNOSIS — M25551 Pain in right hip: Secondary | ICD-10-CM | POA: Diagnosis not present

## 2022-04-23 DIAGNOSIS — N1831 Chronic kidney disease, stage 3a: Secondary | ICD-10-CM | POA: Diagnosis not present

## 2022-04-23 DIAGNOSIS — S7221XD Displaced subtrochanteric fracture of right femur, subsequent encounter for closed fracture with routine healing: Secondary | ICD-10-CM | POA: Diagnosis not present

## 2022-04-23 NOTE — Telephone Encounter (Signed)
Scheduled appts per 2/23 secure chat. Spoke to pt's wife who is aware of appts on 2/29 and appt on 3/1.

## 2022-04-23 NOTE — Telephone Encounter (Signed)
Called patient to confirm dates/times for treatment appointments. Left voicemail with new appointment information and contact details if needing to reschedule.

## 2022-04-23 NOTE — Telephone Encounter (Signed)
[facility] is asking for a weight bearing limit. Please advise..[phone] Miner)

## 2022-04-24 ENCOUNTER — Other Ambulatory Visit: Payer: Self-pay

## 2022-04-25 NOTE — Progress Notes (Addendum)
Pharmacist Chemotherapy Monitoring - Initial Assessment    Anticipated start date: 04/30/22   The following has been reviewed per standard work regarding the patient's treatment regimen: The patient's diagnosis, treatment plan and drug doses, and organ/hematologic function Lab orders and baseline tests specific to treatment regimen  The treatment plan start date, drug sequencing, and pre-medications Prior authorization status  Patient's documented medication list, including drug-drug interaction screen and prescriptions for anti-emetics and supportive care specific to the treatment regimen The drug concentrations, fluid compatibility, administration routes, and timing of the medications to be used The patient's access for treatment and lifetime cumulative dose history, if applicable  The patient's medication allergies and previous infusion related reactions, if applicable   Changes made to treatment plan:  Treatment plan date change  Follow up needed:  Need Pretreatment RBC drawn (ordered). Has had T&S done.   Raul Del [place], [place], BCPS, BCOP 04/25/2022  4:28 PM

## 2022-04-26 DIAGNOSIS — D509 Iron deficiency anemia, unspecified: Secondary | ICD-10-CM | POA: Diagnosis not present

## 2022-04-26 DIAGNOSIS — M1711 Unilateral primary osteoarthritis, right knee: Secondary | ICD-10-CM | POA: Diagnosis not present

## 2022-04-26 DIAGNOSIS — E785 Hyperlipidemia, unspecified: Secondary | ICD-10-CM | POA: Diagnosis not present

## 2022-04-26 DIAGNOSIS — I10 Essential (primary) hypertension: Secondary | ICD-10-CM | POA: Diagnosis not present

## 2022-04-26 NOTE — Telephone Encounter (Signed)
I called and advised Aaron Edelman of Dr. Tawanna Sat message

## 2022-04-27 ENCOUNTER — Other Ambulatory Visit: Payer: Self-pay | Admitting: Hematology

## 2022-04-27 DIAGNOSIS — M25551 Pain in right hip: Secondary | ICD-10-CM | POA: Diagnosis not present

## 2022-04-27 DIAGNOSIS — Z7982 Long term (current) use of aspirin: Secondary | ICD-10-CM | POA: Diagnosis not present

## 2022-04-27 DIAGNOSIS — S7221XD Displaced subtrochanteric fracture of right femur, subsequent encounter for closed fracture with routine healing: Secondary | ICD-10-CM | POA: Diagnosis not present

## 2022-04-27 LAB — SURGICAL PATHOLOGY

## 2022-04-29 ENCOUNTER — Other Ambulatory Visit: Payer: Self-pay

## 2022-04-29 ENCOUNTER — Inpatient Hospital Stay: Payer: Medicare PPO | Attending: Hematology

## 2022-04-29 ENCOUNTER — Inpatient Hospital Stay: Payer: Medicare PPO

## 2022-04-29 ENCOUNTER — Inpatient Hospital Stay (HOSPITAL_BASED_OUTPATIENT_CLINIC_OR_DEPARTMENT_OTHER): Payer: Medicare PPO | Admitting: Hematology

## 2022-04-29 ENCOUNTER — Telehealth: Payer: Self-pay | Admitting: Orthopedic Surgery

## 2022-04-29 VITALS — BP 126/69 | HR 72 | Temp 97.3°F | Resp 18

## 2022-04-29 DIAGNOSIS — C419 Malignant neoplasm of bone and articular cartilage, unspecified: Secondary | ICD-10-CM

## 2022-04-29 DIAGNOSIS — N183 Chronic kidney disease, stage 3 unspecified: Secondary | ICD-10-CM | POA: Diagnosis not present

## 2022-04-29 DIAGNOSIS — D649 Anemia, unspecified: Secondary | ICD-10-CM | POA: Insufficient documentation

## 2022-04-29 DIAGNOSIS — Z7189 Other specified counseling: Secondary | ICD-10-CM

## 2022-04-29 DIAGNOSIS — C9000 Multiple myeloma not having achieved remission: Secondary | ICD-10-CM | POA: Diagnosis not present

## 2022-04-29 LAB — CMP (CANCER CENTER ONLY)
ALT: 21 U/L (ref 0–44)
AST: 42 U/L — ABNORMAL HIGH (ref 15–41)
Albumin: 3.3 g/dL — ABNORMAL LOW (ref 3.5–5.0)
Alkaline Phosphatase: 244 U/L — ABNORMAL HIGH (ref 38–126)
Anion gap: 5 (ref 5–15)
BUN: 13 mg/dL (ref 8–23)
CO2: 26 mmol/L (ref 22–32)
Calcium: 8.7 mg/dL — ABNORMAL LOW (ref 8.9–10.3)
Chloride: 105 mmol/L (ref 98–111)
Creatinine: 1.04 mg/dL (ref 0.61–1.24)
GFR, Estimated: >60 mL/min (ref >60)
Glucose, Bld: 128 mg/dL — ABNORMAL HIGH (ref 70–99)
Potassium: 4.1 mmol/L (ref 3.5–5.1)
Sodium: 136 mmol/L (ref 135–145)
Total Bilirubin: 0.8 mg/dL (ref 0.3–1.2)
Total Protein: 6.5 g/dL (ref 6.5–8.1)

## 2022-04-29 LAB — CBC WITH DIFFERENTIAL (CANCER CENTER ONLY)
Abs Immature Granulocytes: 0.02 10*3/uL (ref 0.00–0.07)
Basophils Absolute: 0 10*3/uL (ref 0.0–0.1)
Basophils Relative: 1 %
Eosinophils Absolute: 0.2 10*3/uL (ref 0.0–0.5)
Eosinophils Relative: 4 %
HCT: 31.5 % — ABNORMAL LOW (ref 39.0–52.0)
Hemoglobin: 10.6 g/dL — ABNORMAL LOW (ref 13.0–17.0)
Immature Granulocytes: 0 %
Lymphocytes Relative: 16 %
Lymphs Abs: 1 10*3/uL (ref 0.7–4.0)
MCH: 30.6 pg (ref 26.0–34.0)
MCHC: 33.7 g/dL (ref 30.0–36.0)
MCV: 91 fL (ref 80.0–100.0)
Monocytes Absolute: 0.7 10*3/uL (ref 0.1–1.0)
Monocytes Relative: 11 %
Neutro Abs: 4.4 10*3/uL (ref 1.7–7.7)
Neutrophils Relative %: 68 %
Platelet Count: 274 10*3/uL (ref 150–400)
RBC: 3.46 MIL/uL — ABNORMAL LOW (ref 4.22–5.81)
RDW: 14.7 % (ref 11.5–15.5)
WBC Count: 6.4 10*3/uL (ref 4.0–10.5)
nRBC: 0 % (ref 0.0–0.2)

## 2022-04-29 LAB — TYPE AND SCREEN
ABO/RH(D): A POS
Antibody Screen: NEGATIVE

## 2022-04-29 NOTE — Telephone Encounter (Signed)
Patients wife called and advised patient is having his first treatment at [facility] tomorrow and it would be a 10 am and to left Dr. Laurance Flatten know, he wanted to check the surgery site. I dont know if the Wife had the correct Dr. Laurance Flatten, but I looked in chart and didn't see Dr. Georgia Duff name I advised patients wife and she said to please tell Dr. Laurance Flatten that information.
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
## 2022-04-29 NOTE — Telephone Encounter (Signed)
I called and lmom to let [name] know that Dr. Laurance Flatten would not be in the office, that he wanted them to come in on Tuesday 05/04/22 to be checked.

## 2022-04-30 ENCOUNTER — Other Ambulatory Visit: Payer: Self-pay

## 2022-04-30 ENCOUNTER — Inpatient Hospital Stay: Payer: Medicare PPO | Attending: Hematology

## 2022-04-30 ENCOUNTER — Telehealth: Payer: Self-pay

## 2022-04-30 VITALS — BP 128/68 | HR 72 | Temp 98.2°F | Resp 18

## 2022-04-30 DIAGNOSIS — I10 Essential (primary) hypertension: Secondary | ICD-10-CM | POA: Diagnosis not present

## 2022-04-30 DIAGNOSIS — S7221XD Displaced subtrochanteric fracture of right femur, subsequent encounter for closed fracture with routine healing: Secondary | ICD-10-CM | POA: Diagnosis not present

## 2022-04-30 DIAGNOSIS — Z7189 Other specified counseling: Secondary | ICD-10-CM

## 2022-04-30 DIAGNOSIS — Z79899 Other long term (current) drug therapy: Secondary | ICD-10-CM | POA: Diagnosis not present

## 2022-04-30 DIAGNOSIS — Z7982 Long term (current) use of aspirin: Secondary | ICD-10-CM | POA: Insufficient documentation

## 2022-04-30 DIAGNOSIS — M25561 Pain in right knee: Secondary | ICD-10-CM | POA: Diagnosis not present

## 2022-04-30 DIAGNOSIS — Z79624 Long term (current) use of inhibitors of nucleotide synthesis: Secondary | ICD-10-CM | POA: Insufficient documentation

## 2022-04-30 DIAGNOSIS — D649 Anemia, unspecified: Secondary | ICD-10-CM | POA: Diagnosis not present

## 2022-04-30 DIAGNOSIS — M25461 Effusion, right knee: Secondary | ICD-10-CM | POA: Diagnosis not present

## 2022-04-30 DIAGNOSIS — M1711 Unilateral primary osteoarthritis, right knee: Secondary | ICD-10-CM | POA: Diagnosis not present

## 2022-04-30 DIAGNOSIS — Z5112 Encounter for antineoplastic immunotherapy: Secondary | ICD-10-CM | POA: Diagnosis not present

## 2022-04-30 DIAGNOSIS — N183 Chronic kidney disease, stage 3 unspecified: Secondary | ICD-10-CM | POA: Diagnosis not present

## 2022-04-30 DIAGNOSIS — C9000 Multiple myeloma not having achieved remission: Secondary | ICD-10-CM | POA: Diagnosis not present

## 2022-04-30 DIAGNOSIS — E8581 Light chain (AL) amyloidosis: Secondary | ICD-10-CM | POA: Diagnosis not present

## 2022-04-30 DIAGNOSIS — I130 Hypertensive heart and chronic kidney disease with heart failure and stage 1 through stage 4 chronic kidney disease, or unspecified chronic kidney disease: Secondary | ICD-10-CM | POA: Diagnosis not present

## 2022-04-30 DIAGNOSIS — N1831 Chronic kidney disease, stage 3a: Secondary | ICD-10-CM | POA: Diagnosis not present

## 2022-04-30 DIAGNOSIS — M25551 Pain in right hip: Secondary | ICD-10-CM | POA: Diagnosis not present

## 2022-04-30 DIAGNOSIS — C419 Malignant neoplasm of bone and articular cartilage, unspecified: Secondary | ICD-10-CM

## 2022-04-30 DIAGNOSIS — S72001A Fracture of unspecified part of neck of right femur, initial encounter for closed fracture: Secondary | ICD-10-CM | POA: Diagnosis not present

## 2022-04-30 MED ORDER — DARATUMUMAB-HYALURONIDASE-FIHJ 1800-30000 MG-UT/15ML ~~LOC~~ SOLN
1800.0000 mg | Freq: Once | SUBCUTANEOUS | Status: AC
  Administered 2022-04-30: 1800 mg via SUBCUTANEOUS
  Filled 2022-04-30: qty 15

## 2022-04-30 MED ORDER — DEXAMETHASONE 4 MG PO TABS
20.0000 mg | ORAL_TABLET | Freq: Once | ORAL | Status: AC
  Administered 2022-04-30: 20 mg via ORAL
  Filled 2022-04-30: qty 5

## 2022-04-30 MED ORDER — BORTEZOMIB CHEMO SQ INJECTION 3.5 MG (2.5MG/ML)
1.3000 mg/m2 | Freq: Once | INTRAMUSCULAR | Status: AC
  Administered 2022-04-30: 2.75 mg via SUBCUTANEOUS
  Filled 2022-04-30: qty 1.1

## 2022-04-30 MED ORDER — ACETAMINOPHEN 325 MG PO TABS
650.0000 mg | ORAL_TABLET | Freq: Once | ORAL | Status: AC
  Administered 2022-04-30: 650 mg via ORAL
  Filled 2022-04-30: qty 2

## 2022-04-30 MED ORDER — MONTELUKAST SODIUM 10 MG PO TABS
10.0000 mg | ORAL_TABLET | Freq: Once | ORAL | Status: AC
  Administered 2022-04-30: 10 mg via ORAL
  Filled 2022-04-30: qty 1

## 2022-04-30 MED ORDER — DENOSUMAB 120 MG/1.7ML ~~LOC~~ SOLN
120.0000 mg | Freq: Once | SUBCUTANEOUS | Status: AC
  Administered 2022-04-30: 120 mg via SUBCUTANEOUS
  Filled 2022-04-30: qty 1.7

## 2022-04-30 MED ORDER — FAMOTIDINE 20 MG PO TABS
40.0000 mg | ORAL_TABLET | Freq: Once | ORAL | Status: AC
  Administered 2022-04-30: 40 mg via ORAL
  Filled 2022-04-30: qty 2

## 2022-04-30 MED ORDER — DIPHENHYDRAMINE HCL 25 MG PO CAPS
50.0000 mg | ORAL_CAPSULE | Freq: Once | ORAL | Status: AC
  Administered 2022-04-30: 50 mg via ORAL
  Filled 2022-04-30: qty 2

## 2022-04-30 NOTE — Progress Notes (Signed)
Patient waited his 2 hour post darzalex FP with no issues. Tolerated treatment well.

## 2022-04-30 NOTE — Telephone Encounter (Signed)
CSW attempted to contact patient per the request of Dr. Irene Limbo to assess needs.  Left vm.

## 2022-04-30 NOTE — Patient Instructions (Signed)
Frank Hernandez  Discharge Instructions: Thank you for choosing [facility] to provide your oncology and hematology care.   If you have a lab appointment with the [facility], please go directly to the [facility] and check in at the registration area.   Wear comfortable clothing and clothing appropriate for easy access to any Portacath or PICC line.   We strive to give you quality time with your provider. You may need to reschedule your appointment if you arrive late (15 or more minutes).  Arriving late affects you and other patients whose appointments are after yours.  Also, if you miss three or more appointments without notifying the office, you may be dismissed from the clinic at the provider's discretion.      For prescription refill requests, have your pharmacy contact our office and allow 72 hours for refills to be completed.    Today you received the following chemotherapy and/or immunotherapy agents Velcade, Darzalex fast pro    To help prevent nausea and vomiting after your treatment, we encourage you to take your nausea medication as directed.  BELOW ARE SYMPTOMS THAT SHOULD BE REPORTED IMMEDIATELY: *FEVER GREATER THAN 100.4 F (38 C) OR HIGHER *CHILLS OR SWEATING *NAUSEA AND VOMITING THAT IS NOT CONTROLLED WITH YOUR NAUSEA MEDICATION *UNUSUAL SHORTNESS OF BREATH *UNUSUAL BRUISING OR BLEEDING *URINARY PROBLEMS (pain or burning when urinating, or frequent urination) *BOWEL PROBLEMS (unusual diarrhea, constipation, pain near the anus) TENDERNESS IN MOUTH AND THROAT WITH OR WITHOUT PRESENCE OF ULCERS (sore throat, sores in mouth, or a toothache) UNUSUAL RASH, SWELLING OR PAIN  UNUSUAL VAGINAL DISCHARGE OR ITCHING   Items with * indicate a potential emergency and should be followed up as soon as possible or go to the Emergency Department if any problems should occur.  Please show the CHEMOTHERAPY ALERT CARD or IMMUNOTHERAPY ALERT  CARD at check-in to the Emergency Department and triage nurse.  Should you have questions after your visit or need to cancel or reschedule your appointment, please contact [facility]  Dept: [phone]  and follow the prompts.  Office hours are 8:00 a.m. to 4:30 p.m. Monday - Friday. Please note that voicemails left after 4:00 p.m. may not be returned until the following business day.  We are closed weekends and major holidays. You have access to a nurse at all times for urgent questions. Please call the main number to the clinic Dept: [phone] and follow the prompts.   For any non-urgent questions, you may also contact your provider using MyChart. We now offer e-Visits for anyone 19 and older to request care online for non-urgent symptoms. For details visit [email or web address].   Also download the MyChart app! Go to the app store, search "MyChart", open the app, select [facility], and log in with your MyChart username and password.   Bortezomib Injection What is this medication? BORTEZOMIB (bor TEZ oh mib) treats lymphoma. It may also be used to treat multiple myeloma, a type of bone marrow cancer. It works by blocking a protein that causes cancer cells to grow and multiply. This helps to slow or stop the spread of cancer cells. This medicine may be used for other purposes; ask your health care provider or pharmacist if you have questions. COMMON BRAND NAME(S): Velcade What should I tell my care team before I take this medication? They need to know if you have any of these conditions: Dehydration Diabetes Heart disease Liver  disease Tingling of the fingers or toes or other nerve disorder An unusual or allergic reaction to bortezomib, other medications, foods, dyes, or preservatives If you or your partner are pregnant or trying to get pregnant Breastfeeding How should I use this medication? This medication is injected into a vein or under the  skin. It is given by your care team in a hospital or clinic setting. Talk to your care team about the use of this medication in children. Special care may be needed. Overdosage: If you think you have taken too much of this medicine contact a poison control center or emergency room at once. NOTE: This medicine is only for you. Do not share this medicine with others. What if I miss a dose? Keep appointments for follow-up doses. It is important not to miss your dose. Call your care team if you are unable to keep an appointment. What may interact with this medication? Ketoconazole Rifampin This list may not describe all possible interactions. Give your health care provider a list of all the medicines, herbs, non-prescription drugs, or dietary supplements you use. Also tell them if you smoke, drink alcohol, or use illegal drugs. Some items may interact with your medicine. What should I watch for while using this medication? Your condition will be monitored carefully while you are receiving this medication. You may need blood work while taking this medication. This medication may affect your coordination, reaction time, or judgment. Do not drive or operate machinery until you know how this medication affects you. Sit up or stand slowly to reduce the risk of dizzy or fainting spells. Drinking alcohol with this medication can increase the risk of these side effects. This medication may increase your risk of getting an infection. Call your care team for advice if you get a fever, chills, sore throat, or other symptoms of a cold or flu. Do not treat yourself. Try to avoid being around people who are sick. Check with your care team if you have severe diarrhea, nausea, and vomiting, or if you sweat a lot. The loss of too much body fluid may make it dangerous for you to take this medication. Talk to your care team if you may be pregnant. Serious birth defects can occur if you take this medication during pregnancy  and for 7 months after the last dose. You will need a negative pregnancy test before starting this medication. Contraception is recommended while taking this medication and for 7 months after the last dose. Your care team can help you find the option that works for you. If your partner can get pregnant, use a condom during sex while taking this medication and for 4 months after the last dose. Do not breastfeed while taking this medication and for 2 months after the last dose. This medication may cause infertility. Talk to your care team if you are concerned about your fertility. What side effects may I notice from receiving this medication? Side effects that you should report to your care team as soon as possible: Allergic reactions--skin rash, itching, hives, swelling of the face, lips, tongue, or throat Bleeding--bloody or black, tar-like stools, vomiting blood or brown material that looks like coffee grounds, red or dark brown urine, small red or purple spots on skin, unusual bruising or bleeding Bleeding in the brain--severe headache, stiff neck, confusion, dizziness, change in vision, numbness or weakness of the face, arm, or leg, trouble speaking, trouble walking, vomiting Bowel blockage--stomach cramping, unable to have a bowel movement or pass gas,  loss of appetite, vomiting Heart failure--shortness of breath, swelling of the ankles, feet, or hands, sudden weight gain, unusual weakness or fatigue Infection--fever, chills, cough, sore throat, wounds that don't heal, pain or trouble when passing urine, general feeling of discomfort or being unwell Liver injury--right upper belly pain, loss of appetite, nausea, light-colored stool, dark yellow or brown urine, yellowing skin or eyes, unusual weakness or fatigue Low blood pressure--dizziness, feeling faint or lightheaded, blurry vision Lung injury--shortness of breath or trouble breathing, cough, spitting up blood, chest pain, fever Pain, tingling,  or numbness in the hands or feet Severe or prolonged diarrhea Stomach pain, bloody diarrhea, pale skin, unusual weakness or fatigue, decrease in the amount of urine, which may be signs of hemolytic uremic syndrome Sudden and severe headache, confusion, change in vision, seizures, which may be signs of posterior reversible encephalopathy syndrome (PRES) TTP--purple spots on the skin or inside the mouth, pale skin, yellowing skin or eyes, unusual weakness or fatigue, fever, fast or irregular heartbeat, confusion, change in vision, trouble speaking, trouble walking Tumor lysis syndrome (TLS)--nausea, vomiting, diarrhea, decrease in the amount of urine, dark urine, unusual weakness or fatigue, confusion, muscle pain or cramps, fast or irregular heartbeat, joint pain Side effects that usually do not require medical attention (report to your care team if they continue or are bothersome): Constipation Diarrhea Fatigue Loss of appetite Nausea This list may not describe all possible side effects. Call your doctor for medical advice about side effects. You may report side effects to FDA at 1-800-FDA-1088. Where should I keep my medication? This medication is given in a hospital or clinic. It will not be stored at home. NOTE: This sheet is a summary. It may not cover all possible information. If you have questions about this medicine, talk to your doctor, pharmacist, or health care provider.  2023 Elsevier/Gold Standard (2021-07-15 00:00:00)   Daratumumab Injection What is this medication? DARATUMUMAB (dar a toom ue mab) treats multiple myeloma, a type of bone marrow cancer. It works by helping your immune system slow or stop the spread of cancer cells. It is a monoclonal antibody. This medicine may be used for other purposes; ask your health care provider or pharmacist if you have questions. COMMON BRAND NAME(S): DARZALEX What should I tell my care team before I take this medication? They need to  know if you have any of these conditions: Hereditary fructose intolerance Infection, such as chickenpox, herpes, hepatitis B Lung or breathing disease, such as asthma, COPD An unusual or allergic reaction to daratumumab, sorbitol, other medications, foods, dyes, or preservatives Pregnant or trying to get pregnant Breastfeeding How should I use this medication? This medication is injected into a vein. It is given by your care team in a hospital or clinic setting. Talk to your care team about the use of this medication in children. Special care may be needed. Overdosage: If you think you have taken too much of this medicine contact a poison control center or emergency room at once. NOTE: This medicine is only for you. Do not share this medicine with others. What if I miss a dose? Keep appointments for follow-up doses. It is important not to miss your dose. Call your care team if you are unable to keep an appointment. What may interact with this medication? Interactions have not been studied. This list may not describe all possible interactions. Give your health care provider a list of all the medicines, herbs, non-prescription drugs, or dietary supplements you use. Also tell  them if you smoke, drink alcohol, or use illegal drugs. Some items may interact with your medicine. What should I watch for while using this medication? Your condition will be monitored carefully while you are receiving this medication. This medication can cause serious allergic reactions. To reduce your risk, your care team may give you other medication to take before receiving this one. Be sure to follow the directions from your care team. This medication can affect the results of blood tests to match your blood type. These changes can last for up to 6 months after the final dose. Your care team will do blood tests to match your blood type before you start treatment. Tell all of your care team that you are being treated with  this medication before receiving a blood transfusion. This medication can affect the results of some tests used to determine treatment response; extra tests may be needed to evaluate response. Talk to your care team if you wish to become pregnant or think you are pregnant. This medication can cause serious birth defects if taken during pregnancy and for 3 months after the last dose. A reliable form of contraception is recommended while taking this medication and for 3 months after the last dose. Talk to your care team about effective forms of contraception. Do not breast-feed while taking this medication. What side effects may I notice from receiving this medication? Side effects that you should report to your care team as soon as possible: Allergic reactions--skin rash, itching, hives, swelling of the face, lips, tongue, or throat Infection--fever, chills, cough, sore throat, wounds that don't heal, pain or trouble when passing urine, general feeling of discomfort or being unwell Infusion reactions--chest pain, shortness of breath or trouble breathing, feeling faint or lightheaded Unusual bruising or bleeding Side effects that usually do not require medical attention (report to your care team if they continue or are bothersome): Constipation Diarrhea Fatigue Nausea Pain, tingling, or numbness in the hands or feet Swelling of the ankles, hands, or feet This list may not describe all possible side effects. Call your doctor for medical advice about side effects. You may report side effects to FDA at 1-800-FDA-1088. Where should I keep my medication? This medication is given in a hospital or clinic. It will not be stored at home. NOTE: This sheet is a summary. It may not cover all possible information. If you have questions about this medicine, talk to your doctor, pharmacist, or health care provider.  2023 Elsevier/Gold Standard (2021-06-10 00:00:00)

## 2022-05-01 ENCOUNTER — Other Ambulatory Visit: Payer: Self-pay

## 2022-05-03 ENCOUNTER — Encounter (HOSPITAL_COMMUNITY): Payer: Self-pay | Admitting: Hematology

## 2022-05-03 ENCOUNTER — Telehealth: Payer: Self-pay | Admitting: *Deleted

## 2022-05-03 DIAGNOSIS — D509 Iron deficiency anemia, unspecified: Secondary | ICD-10-CM | POA: Diagnosis not present

## 2022-05-03 DIAGNOSIS — C7951 Secondary malignant neoplasm of bone: Secondary | ICD-10-CM | POA: Diagnosis not present

## 2022-05-03 DIAGNOSIS — E785 Hyperlipidemia, unspecified: Secondary | ICD-10-CM | POA: Diagnosis not present

## 2022-05-03 DIAGNOSIS — C9000 Multiple myeloma not having achieved remission: Secondary | ICD-10-CM | POA: Diagnosis not present

## 2022-05-03 NOTE — Telephone Encounter (Signed)
I called and advised Frank Hernandez that we need to get him scheduled for f/u after surgery, I scheduled him for 05/06/22 @ 3pm

## 2022-05-03 NOTE — Telephone Encounter (Signed)
Left message on both home & mobile #'s to call back to let us know how he is doing post new treatment.

## 2022-05-03 NOTE — Telephone Encounter (Signed)
-----   Message from Tildon Husky, RN sent at 04/30/2022  1:52 PM EST ----- Regarding: first time treatment call back- darzalex fast pro, velcade Patient received treatment for th first time today. He is followed by Dr. Irene Limbo. Patient received darzalex FP, and Velcade. Patient waited his 2 hour post obs for darzalex today with no issues.

## 2022-05-04 ENCOUNTER — Encounter (HOSPITAL_COMMUNITY): Payer: Self-pay | Admitting: Hematology

## 2022-05-04 DIAGNOSIS — S7221XD Displaced subtrochanteric fracture of right femur, subsequent encounter for closed fracture with routine healing: Secondary | ICD-10-CM | POA: Diagnosis not present

## 2022-05-04 DIAGNOSIS — R29898 Other symptoms and signs involving the musculoskeletal system: Secondary | ICD-10-CM | POA: Diagnosis not present

## 2022-05-04 LAB — PRETREATMENT RBC PHENOTYPE

## 2022-05-05 ENCOUNTER — Encounter: Payer: Self-pay | Admitting: Hematology

## 2022-05-06 ENCOUNTER — Ambulatory Visit (INDEPENDENT_AMBULATORY_CARE_PROVIDER_SITE_OTHER): Payer: Medicare PPO

## 2022-05-06 ENCOUNTER — Ambulatory Visit (INDEPENDENT_AMBULATORY_CARE_PROVIDER_SITE_OTHER): Payer: Medicare PPO | Admitting: Orthopedic Surgery

## 2022-05-06 DIAGNOSIS — M25551 Pain in right hip: Secondary | ICD-10-CM | POA: Diagnosis not present

## 2022-05-06 NOTE — Progress Notes (Addendum)
Orthopedic Surgery Post-operative Office Visit  Procedure: Right pathological subtrochanteric femur fracture, biopsy right proximal femur Date of Surgery: 04/17/2022  Assessment: Patient is a 82 y.o. who is doing well after right pathological subtrochanteric femur fracture s/p cephalomedullary rodding   Plan: -Weight bear as tolerated right lower extremity -Staples removed in office today -Okay to let soap and water run over the incision do not submerge -Okay for radiation to the right femur -Pain management: weaning oxycodone -Return to office in 4 weeks, x-rays needed at next visit: right femur AP/lateral  ___________________________________________________________________________   Subjective: Patient has been in a SNF. Not having much hip pain. Has been ambulating with walker with PT at his facility. There has no been drainage or redness around his incisions. Denies paresthesias and numbness.   Objective:  General: no acute distress, appropriate affect Neurologic: alert, answering questions appropriately, following commands Respiratory: unlabored breathing on room air Skin: incision  is well approximated with no induration, erythema, active/expressible drainage  MSK (RLE):  -Knee swelling resolved -Able to perform transfer and take a couple of steps -EHL/TA/GSC intact -Sensation intact to light touch in sural/saphenous/deep peroneal/superficial peroneal/tibial nerve distributions -foot warm and well perfused, DP pulse palpable   Imaging: X-rays of the right femur taken today were independently reviewed and interpreted, showing cephalomedullary rod in place. Alignment from immediate post-op films is maintained. Screws have not backed out. Lag screws are near center-center position.    Patient name: Frank Hernandez Patient MRN: [id number] Date of visit: 05/06/22

## 2022-05-07 ENCOUNTER — Other Ambulatory Visit: Payer: Self-pay

## 2022-05-07 ENCOUNTER — Inpatient Hospital Stay: Payer: Medicare PPO

## 2022-05-07 VITALS — BP 126/61 | HR 75 | Temp 98.1°F | Resp 16 | Wt 204.8 lb

## 2022-05-07 DIAGNOSIS — Z7982 Long term (current) use of aspirin: Secondary | ICD-10-CM | POA: Diagnosis not present

## 2022-05-07 DIAGNOSIS — Z7189 Other specified counseling: Secondary | ICD-10-CM

## 2022-05-07 DIAGNOSIS — D649 Anemia, unspecified: Secondary | ICD-10-CM | POA: Diagnosis not present

## 2022-05-07 DIAGNOSIS — I130 Hypertensive heart and chronic kidney disease with heart failure and stage 1 through stage 4 chronic kidney disease, or unspecified chronic kidney disease: Secondary | ICD-10-CM | POA: Diagnosis not present

## 2022-05-07 DIAGNOSIS — Z79899 Other long term (current) drug therapy: Secondary | ICD-10-CM | POA: Diagnosis not present

## 2022-05-07 DIAGNOSIS — E8581 Light chain (AL) amyloidosis: Secondary | ICD-10-CM | POA: Diagnosis not present

## 2022-05-07 DIAGNOSIS — N183 Chronic kidney disease, stage 3 unspecified: Secondary | ICD-10-CM | POA: Diagnosis not present

## 2022-05-07 DIAGNOSIS — C9000 Multiple myeloma not having achieved remission: Secondary | ICD-10-CM

## 2022-05-07 DIAGNOSIS — Z79624 Long term (current) use of inhibitors of nucleotide synthesis: Secondary | ICD-10-CM | POA: Diagnosis not present

## 2022-05-07 DIAGNOSIS — Z5112 Encounter for antineoplastic immunotherapy: Secondary | ICD-10-CM | POA: Diagnosis not present

## 2022-05-07 LAB — CBC WITH DIFFERENTIAL (CANCER CENTER ONLY)
Abs Immature Granulocytes: 0 10*3/uL (ref 0.00–0.07)
Basophils Absolute: 0 10*3/uL (ref 0.0–0.1)
Basophils Relative: 0 %
Eosinophils Absolute: 0.2 10*3/uL (ref 0.0–0.5)
Eosinophils Relative: 6 %
HCT: 28.5 % — ABNORMAL LOW (ref 39.0–52.0)
Hemoglobin: 9.4 g/dL — ABNORMAL LOW (ref 13.0–17.0)
Immature Granulocytes: 0 %
Lymphocytes Relative: 17 %
Lymphs Abs: 0.7 10*3/uL (ref 0.7–4.0)
MCH: 30.7 pg (ref 26.0–34.0)
MCHC: 33 g/dL (ref 30.0–36.0)
MCV: 93.1 fL (ref 80.0–100.0)
Monocytes Absolute: 0.7 10*3/uL (ref 0.1–1.0)
Monocytes Relative: 19 %
Neutro Abs: 2.3 10*3/uL (ref 1.7–7.7)
Neutrophils Relative %: 58 %
Platelet Count: 253 10*3/uL (ref 150–400)
RBC: 3.06 MIL/uL — ABNORMAL LOW (ref 4.22–5.81)
RDW: 15.5 % (ref 11.5–15.5)
WBC Count: 3.9 10*3/uL — ABNORMAL LOW (ref 4.0–10.5)
nRBC: 0 % (ref 0.0–0.2)

## 2022-05-07 LAB — CMP (CANCER CENTER ONLY)
ALT: 20 U/L (ref 0–44)
AST: 33 U/L (ref 15–41)
Albumin: 3.5 g/dL (ref 3.5–5.0)
Alkaline Phosphatase: 218 U/L — ABNORMAL HIGH (ref 38–126)
Anion gap: 6 (ref 5–15)
BUN: 18 mg/dL (ref 8–23)
CO2: 23 mmol/L (ref 22–32)
Calcium: 8.8 mg/dL — ABNORMAL LOW (ref 8.9–10.3)
Chloride: 107 mmol/L (ref 98–111)
Creatinine: 1.14 mg/dL (ref 0.61–1.24)
GFR, Estimated: >60 mL/min (ref >60)
Glucose, Bld: 168 mg/dL — ABNORMAL HIGH (ref 70–99)
Potassium: 4.2 mmol/L (ref 3.5–5.1)
Sodium: 136 mmol/L (ref 135–145)
Total Bilirubin: 0.8 mg/dL (ref 0.3–1.2)
Total Protein: 6.9 g/dL (ref 6.5–8.1)

## 2022-05-07 MED ORDER — FAMOTIDINE 20 MG PO TABS
40.0000 mg | ORAL_TABLET | Freq: Once | ORAL | Status: AC
  Administered 2022-05-07: 40 mg via ORAL
  Filled 2022-05-07: qty 2

## 2022-05-07 MED ORDER — DIPHENHYDRAMINE HCL 25 MG PO CAPS
50.0000 mg | ORAL_CAPSULE | Freq: Once | ORAL | Status: AC
  Administered 2022-05-07: 50 mg via ORAL
  Filled 2022-05-07: qty 2

## 2022-05-07 MED ORDER — DARATUMUMAB-HYALURONIDASE-FIHJ 1800-30000 MG-UT/15ML ~~LOC~~ SOLN
1800.0000 mg | Freq: Once | SUBCUTANEOUS | Status: AC
  Administered 2022-05-07: 1800 mg via SUBCUTANEOUS
  Filled 2022-05-07: qty 15

## 2022-05-07 MED ORDER — MONTELUKAST SODIUM 10 MG PO TABS
10.0000 mg | ORAL_TABLET | Freq: Once | ORAL | Status: AC
  Administered 2022-05-07: 10 mg via ORAL
  Filled 2022-05-07: qty 1

## 2022-05-07 MED ORDER — BORTEZOMIB CHEMO SQ INJECTION 3.5 MG (2.5MG/ML)
1.3000 mg/m2 | Freq: Once | INTRAMUSCULAR | Status: AC
  Administered 2022-05-07: 2.75 mg via SUBCUTANEOUS
  Filled 2022-05-07: qty 1.1

## 2022-05-07 MED ORDER — ACETAMINOPHEN 325 MG PO TABS
650.0000 mg | ORAL_TABLET | Freq: Once | ORAL | Status: AC
  Administered 2022-05-07: 650 mg via ORAL
  Filled 2022-05-07: qty 2

## 2022-05-07 MED ORDER — DEXAMETHASONE 4 MG PO TABS
20.0000 mg | ORAL_TABLET | Freq: Once | ORAL | Status: AC
  Administered 2022-05-07: 20 mg via ORAL
  Filled 2022-05-07: qty 5

## 2022-05-07 NOTE — Patient Instructions (Signed)
[facility]  Discharge Instructions: Thank you for choosing [facility] to provide your oncology and hematology care.   If you have a lab appointment with the Cancer Center, please go directly to the Cancer Center and check in at the registration area.   Wear comfortable clothing and clothing appropriate for easy access to any Portacath or PICC line.   We strive to give you quality time with your provider. You may need to reschedule your appointment if you arrive late (15 or more minutes).  Arriving late affects you and other patients whose appointments are after yours.  Also, if you miss three or more appointments without notifying the office, you may be dismissed from the clinic at the provider's discretion.      For prescription refill requests, have your pharmacy contact our office and allow 72 hours for refills to be completed.    Today you received the following chemotherapy and/or immunotherapy agents Velcade, Darzalex fast pro    To help prevent nausea and vomiting after your treatment, we encourage you to take your nausea medication as directed.  BELOW ARE SYMPTOMS THAT SHOULD BE REPORTED IMMEDIATELY: *FEVER GREATER THAN 100.4 F (38 C) OR HIGHER *CHILLS OR SWEATING *NAUSEA AND VOMITING THAT IS NOT CONTROLLED WITH YOUR NAUSEA MEDICATION *UNUSUAL SHORTNESS OF BREATH *UNUSUAL BRUISING OR BLEEDING *URINARY PROBLEMS (pain or burning when urinating, or frequent urination) *BOWEL PROBLEMS (unusual diarrhea, constipation, pain near the anus) TENDERNESS IN MOUTH AND THROAT WITH OR WITHOUT PRESENCE OF ULCERS (sore throat, sores in mouth, or a toothache) UNUSUAL RASH, SWELLING OR PAIN  UNUSUAL VAGINAL DISCHARGE OR ITCHING   Items with * indicate a potential emergency and should be followed up as soon as possible or go to the Emergency Department if any problems should occur.  Please show the CHEMOTHERAPY ALERT CARD or IMMUNOTHERAPY ALERT  CARD at check-in to the Emergency Department and triage nurse.  Should you have questions after your visit or need to cancel or reschedule your appointment, please contact [facility]  Dept: [phone]  and follow the prompts.  Office hours are 8:00 a.m. to 4:30 p.m. Monday - Friday. Please note that voicemails left after 4:00 p.m. may not be returned until the following business day.  We are closed weekends and major holidays. You have access to a nurse at all times for urgent questions. Please call the main number to the clinic Dept: [phone] and follow the prompts.   For any non-urgent questions, you may also contact your provider using MyChart. We now offer e-Visits for anyone 18 and older to request care online for non-urgent symptoms. For details visit [email or web address].   Also download the MyChart app! Go to the app store, search "MyChart", open the app, select [facility], and log in with your MyChart username and password.   Bortezomib Injection What is this medication? BORTEZOMIB (bor TEZ oh mib) treats lymphoma. It may also be used to treat multiple myeloma, a type of bone marrow cancer. It works by blocking a protein that causes cancer cells to grow and multiply. This helps to slow or stop the spread of cancer cells. This medicine may be used for other purposes; ask your health care provider or pharmacist if you have questions. COMMON BRAND NAME(S): Velcade What should I tell my care team before I take this medication? They need to know if you have any of these conditions: Dehydration Diabetes Heart disease Liver   disease Tingling of the fingers or toes or other nerve disorder An unusual or allergic reaction to bortezomib, other medications, foods, dyes, or preservatives If you or your partner are pregnant or trying to get pregnant Breastfeeding How should I use this medication? This medication is injected into a vein or under the  skin. It is given by your care team in a hospital or clinic setting. Talk to your care team about the use of this medication in children. Special care may be needed. Overdosage: If you think you have taken too much of this medicine contact a poison control center or emergency room at once. NOTE: This medicine is only for you. Do not share this medicine with others. What if I miss a dose? Keep appointments for follow-up doses. It is important not to miss your dose. Call your care team if you are unable to keep an appointment. What may interact with this medication? Ketoconazole Rifampin This list may not describe all possible interactions. Give your health care provider a list of all the medicines, herbs, non-prescription drugs, or dietary supplements you use. Also tell them if you smoke, drink alcohol, or use illegal drugs. Some items may interact with your medicine. What should I watch for while using this medication? Your condition will be monitored carefully while you are receiving this medication. You may need blood work while taking this medication. This medication may affect your coordination, reaction time, or judgment. Do not drive or operate machinery until you know how this medication affects you. Sit up or stand slowly to reduce the risk of dizzy or fainting spells. Drinking alcohol with this medication can increase the risk of these side effects. This medication may increase your risk of getting an infection. Call your care team for advice if you get a fever, chills, sore throat, or other symptoms of a cold or flu. Do not treat yourself. Try to avoid being around people who are sick. Check with your care team if you have severe diarrhea, nausea, and vomiting, or if you sweat a lot. The loss of too much body fluid may make it dangerous for you to take this medication. Talk to your care team if you may be pregnant. Serious birth defects can occur if you take this medication during pregnancy  and for 7 months after the last dose. You will need a negative pregnancy test before starting this medication. Contraception is recommended while taking this medication and for 7 months after the last dose. Your care team can help you find the option that works for you. If your partner can get pregnant, use a condom during sex while taking this medication and for 4 months after the last dose. Do not breastfeed while taking this medication and for 2 months after the last dose. This medication may cause infertility. Talk to your care team if you are concerned about your fertility. What side effects may I notice from receiving this medication? Side effects that you should report to your care team as soon as possible: Allergic reactions--skin rash, itching, hives, swelling of the face, lips, tongue, or throat Bleeding--bloody or black, tar-like stools, vomiting blood or brown material that looks like coffee grounds, red or dark brown urine, small red or purple spots on skin, unusual bruising or bleeding Bleeding in the brain--severe headache, stiff neck, confusion, dizziness, change in vision, numbness or weakness of the face, arm, or leg, trouble speaking, trouble walking, vomiting Bowel blockage--stomach cramping, unable to have a bowel movement or pass gas,   loss of appetite, vomiting Heart failure--shortness of breath, swelling of the ankles, feet, or hands, sudden weight gain, unusual weakness or fatigue Infection--fever, chills, cough, sore throat, wounds that don't heal, pain or trouble when passing urine, general feeling of discomfort or being unwell Liver injury--right upper belly pain, loss of appetite, nausea, light-colored stool, dark yellow or brown urine, yellowing skin or eyes, unusual weakness or fatigue Low blood pressure--dizziness, feeling faint or lightheaded, blurry vision Lung injury--shortness of breath or trouble breathing, cough, spitting up blood, chest pain, fever Pain, tingling,  or numbness in the hands or feet Severe or prolonged diarrhea Stomach pain, bloody diarrhea, pale skin, unusual weakness or fatigue, decrease in the amount of urine, which may be signs of hemolytic uremic syndrome Sudden and severe headache, confusion, change in vision, seizures, which may be signs of posterior reversible encephalopathy syndrome (PRES) TTP--purple spots on the skin or inside the mouth, pale skin, yellowing skin or eyes, unusual weakness or fatigue, fever, fast or irregular heartbeat, confusion, change in vision, trouble speaking, trouble walking Tumor lysis syndrome (TLS)--nausea, vomiting, diarrhea, decrease in the amount of urine, dark urine, unusual weakness or fatigue, confusion, muscle pain or cramps, fast or irregular heartbeat, joint pain Side effects that usually do not require medical attention (report to your care team if they continue or are bothersome): Constipation Diarrhea Fatigue Loss of appetite Nausea This list may not describe all possible side effects. Call your doctor for medical advice about side effects. You may report side effects to FDA at 1-800-FDA-1088. Where should I keep my medication? This medication is given in a hospital or clinic. It will not be stored at home. NOTE: This sheet is a summary. It may not cover all possible information. If you have questions about this medicine, talk to your doctor, pharmacist, or health care provider.  2023 Elsevier/Gold Standard (2021-07-15 00:00:00)   Daratumumab Injection What is this medication? DARATUMUMAB (dar a toom ue mab) treats multiple myeloma, a type of bone marrow cancer. It works by helping your immune system slow or stop the spread of cancer cells. It is a monoclonal antibody. This medicine may be used for other purposes; ask your health care provider or pharmacist if you have questions. COMMON BRAND NAME(S): DARZALEX What should I tell my care team before I take this medication? They need to  know if you have any of these conditions: Hereditary fructose intolerance Infection, such as chickenpox, herpes, hepatitis B Lung or breathing disease, such as asthma, COPD An unusual or allergic reaction to daratumumab, sorbitol, other medications, foods, dyes, or preservatives Pregnant or trying to get pregnant Breastfeeding How should I use this medication? This medication is injected into a vein. It is given by your care team in a hospital or clinic setting. Talk to your care team about the use of this medication in children. Special care may be needed. Overdosage: If you think you have taken too much of this medicine contact a poison control center or emergency room at once. NOTE: This medicine is only for you. Do not share this medicine with others. What if I miss a dose? Keep appointments for follow-up doses. It is important not to miss your dose. Call your care team if you are unable to keep an appointment. What may interact with this medication? Interactions have not been studied. This list may not describe all possible interactions. Give your health care provider a list of all the medicines, herbs, non-prescription drugs, or dietary supplements you use. Also tell   them if you smoke, drink alcohol, or use illegal drugs. Some items may interact with your medicine. What should I watch for while using this medication? Your condition will be monitored carefully while you are receiving this medication. This medication can cause serious allergic reactions. To reduce your risk, your care team may give you other medication to take before receiving this one. Be sure to follow the directions from your care team. This medication can affect the results of blood tests to match your blood type. These changes can last for up to 6 months after the final dose. Your care team will do blood tests to match your blood type before you start treatment. Tell all of your care team that you are being treated with  this medication before receiving a blood transfusion. This medication can affect the results of some tests used to determine treatment response; extra tests may be needed to evaluate response. Talk to your care team if you wish to become pregnant or think you are pregnant. This medication can cause serious birth defects if taken during pregnancy and for 3 months after the last dose. A reliable form of contraception is recommended while taking this medication and for 3 months after the last dose. Talk to your care team about effective forms of contraception. Do not breast-feed while taking this medication. What side effects may I notice from receiving this medication? Side effects that you should report to your care team as soon as possible: Allergic reactions--skin rash, itching, hives, swelling of the face, lips, tongue, or throat Infection--fever, chills, cough, sore throat, wounds that don't heal, pain or trouble when passing urine, general feeling of discomfort or being unwell Infusion reactions--chest pain, shortness of breath or trouble breathing, feeling faint or lightheaded Unusual bruising or bleeding Side effects that usually do not require medical attention (report to your care team if they continue or are bothersome): Constipation Diarrhea Fatigue Nausea Pain, tingling, or numbness in the hands or feet Swelling of the ankles, hands, or feet This list may not describe all possible side effects. Call your doctor for medical advice about side effects. You may report side effects to FDA at 1-800-FDA-1088. Where should I keep my medication? This medication is given in a hospital or clinic. It will not be stored at home. NOTE: This sheet is a summary. It may not cover all possible information. If you have questions about this medicine, talk to your doctor, pharmacist, or health care provider.  2023 Elsevier/Gold Standard (2021-06-10 00:00:00)    

## 2022-05-10 DIAGNOSIS — L89610 Pressure ulcer of right heel, unstageable: Secondary | ICD-10-CM | POA: Diagnosis not present

## 2022-05-10 DIAGNOSIS — C7951 Secondary malignant neoplasm of bone: Secondary | ICD-10-CM | POA: Diagnosis not present

## 2022-05-10 DIAGNOSIS — D509 Iron deficiency anemia, unspecified: Secondary | ICD-10-CM | POA: Diagnosis not present

## 2022-05-10 DIAGNOSIS — C9000 Multiple myeloma not having achieved remission: Secondary | ICD-10-CM | POA: Diagnosis not present

## 2022-05-10 DIAGNOSIS — E785 Hyperlipidemia, unspecified: Secondary | ICD-10-CM | POA: Diagnosis not present

## 2022-05-10 DIAGNOSIS — M25551 Pain in right hip: Secondary | ICD-10-CM | POA: Diagnosis not present

## 2022-05-13 DIAGNOSIS — L853 Xerosis cutis: Secondary | ICD-10-CM | POA: Diagnosis not present

## 2022-05-14 ENCOUNTER — Other Ambulatory Visit: Payer: Self-pay

## 2022-05-14 ENCOUNTER — Inpatient Hospital Stay (HOSPITAL_BASED_OUTPATIENT_CLINIC_OR_DEPARTMENT_OTHER): Payer: Medicare PPO | Admitting: Physician Assistant

## 2022-05-14 ENCOUNTER — Inpatient Hospital Stay: Payer: Medicare PPO

## 2022-05-14 VITALS — BP 128/79 | HR 69 | Temp 97.9°F | Resp 16

## 2022-05-14 DIAGNOSIS — Z7982 Long term (current) use of aspirin: Secondary | ICD-10-CM | POA: Diagnosis not present

## 2022-05-14 DIAGNOSIS — E8581 Light chain (AL) amyloidosis: Secondary | ICD-10-CM | POA: Diagnosis not present

## 2022-05-14 DIAGNOSIS — Z7189 Other specified counseling: Secondary | ICD-10-CM | POA: Diagnosis not present

## 2022-05-14 DIAGNOSIS — C9000 Multiple myeloma not having achieved remission: Secondary | ICD-10-CM

## 2022-05-14 DIAGNOSIS — Z79899 Other long term (current) drug therapy: Secondary | ICD-10-CM | POA: Diagnosis not present

## 2022-05-14 DIAGNOSIS — I130 Hypertensive heart and chronic kidney disease with heart failure and stage 1 through stage 4 chronic kidney disease, or unspecified chronic kidney disease: Secondary | ICD-10-CM | POA: Diagnosis not present

## 2022-05-14 DIAGNOSIS — N183 Chronic kidney disease, stage 3 unspecified: Secondary | ICD-10-CM | POA: Diagnosis not present

## 2022-05-14 DIAGNOSIS — Z5112 Encounter for antineoplastic immunotherapy: Secondary | ICD-10-CM | POA: Diagnosis not present

## 2022-05-14 DIAGNOSIS — D649 Anemia, unspecified: Secondary | ICD-10-CM | POA: Diagnosis not present

## 2022-05-14 DIAGNOSIS — Z79624 Long term (current) use of inhibitors of nucleotide synthesis: Secondary | ICD-10-CM | POA: Diagnosis not present

## 2022-05-14 LAB — CMP (CANCER CENTER ONLY)
ALT: 18 U/L (ref 0–44)
AST: 28 U/L (ref 15–41)
Albumin: 3.5 g/dL (ref 3.5–5.0)
Alkaline Phosphatase: 217 U/L — ABNORMAL HIGH (ref 38–126)
Anion gap: 6 (ref 5–15)
BUN: 25 mg/dL — ABNORMAL HIGH (ref 8–23)
CO2: 23 mmol/L (ref 22–32)
Calcium: 8.8 mg/dL — ABNORMAL LOW (ref 8.9–10.3)
Chloride: 103 mmol/L (ref 98–111)
Creatinine: 1.23 mg/dL (ref 0.61–1.24)
GFR, Estimated: 59 mL/min — ABNORMAL LOW (ref >60)
Glucose, Bld: 173 mg/dL — ABNORMAL HIGH (ref 70–99)
Potassium: 4.2 mmol/L (ref 3.5–5.1)
Sodium: 132 mmol/L — ABNORMAL LOW (ref 135–145)
Total Bilirubin: 1 mg/dL (ref 0.3–1.2)
Total Protein: 6.8 g/dL (ref 6.5–8.1)

## 2022-05-14 LAB — CBC WITH DIFFERENTIAL (CANCER CENTER ONLY)
Abs Immature Granulocytes: 0 10*3/uL (ref 0.00–0.07)
Basophils Absolute: 0 10*3/uL (ref 0.0–0.1)
Basophils Relative: 0 %
Eosinophils Absolute: 0 10*3/uL (ref 0.0–0.5)
Eosinophils Relative: 1 %
HCT: 26.8 % — ABNORMAL LOW (ref 39.0–52.0)
Hemoglobin: 9.1 g/dL — ABNORMAL LOW (ref 13.0–17.0)
Immature Granulocytes: 0 %
Lymphocytes Relative: 17 %
Lymphs Abs: 0.6 10*3/uL — ABNORMAL LOW (ref 0.7–4.0)
MCH: 31.4 pg (ref 26.0–34.0)
MCHC: 34 g/dL (ref 30.0–36.0)
MCV: 92.4 fL (ref 80.0–100.0)
Monocytes Absolute: 1 10*3/uL (ref 0.1–1.0)
Monocytes Relative: 27 %
Neutro Abs: 1.9 10*3/uL (ref 1.7–7.7)
Neutrophils Relative %: 55 %
Platelet Count: 169 10*3/uL (ref 150–400)
RBC: 2.9 MIL/uL — ABNORMAL LOW (ref 4.22–5.81)
RDW: 16.3 % — ABNORMAL HIGH (ref 11.5–15.5)
WBC Count: 3.6 10*3/uL — ABNORMAL LOW (ref 4.0–10.5)
nRBC: 0 % (ref 0.0–0.2)

## 2022-05-14 MED ORDER — FAMOTIDINE 20 MG PO TABS
40.0000 mg | ORAL_TABLET | Freq: Once | ORAL | Status: AC
  Administered 2022-05-14: 40 mg via ORAL
  Filled 2022-05-14: qty 2

## 2022-05-14 MED ORDER — DARATUMUMAB-HYALURONIDASE-FIHJ 1800-30000 MG-UT/15ML ~~LOC~~ SOLN
1800.0000 mg | Freq: Once | SUBCUTANEOUS | Status: AC
  Administered 2022-05-14: 1800 mg via SUBCUTANEOUS
  Filled 2022-05-14: qty 15

## 2022-05-14 MED ORDER — DEXAMETHASONE 4 MG PO TABS
20.0000 mg | ORAL_TABLET | Freq: Once | ORAL | Status: AC
  Administered 2022-05-14: 20 mg via ORAL
  Filled 2022-05-14: qty 5

## 2022-05-14 MED ORDER — ACETAMINOPHEN 325 MG PO TABS
650.0000 mg | ORAL_TABLET | Freq: Once | ORAL | Status: AC
  Administered 2022-05-14: 650 mg via ORAL
  Filled 2022-05-14: qty 2

## 2022-05-14 MED ORDER — BORTEZOMIB CHEMO SQ INJECTION 3.5 MG (2.5MG/ML)
1.3000 mg/m2 | Freq: Once | INTRAMUSCULAR | Status: AC
  Administered 2022-05-14: 2.75 mg via SUBCUTANEOUS
  Filled 2022-05-14: qty 1.1

## 2022-05-14 MED ORDER — MONTELUKAST SODIUM 10 MG PO TABS
10.0000 mg | ORAL_TABLET | Freq: Once | ORAL | Status: AC
  Administered 2022-05-14: 10 mg via ORAL
  Filled 2022-05-14: qty 1

## 2022-05-14 MED ORDER — DIPHENHYDRAMINE HCL 25 MG PO CAPS
50.0000 mg | ORAL_CAPSULE | Freq: Once | ORAL | Status: AC
  Administered 2022-05-14: 50 mg via ORAL
  Filled 2022-05-14: qty 2

## 2022-05-14 NOTE — Progress Notes (Signed)
HEMATOLOGY/ONCOLOGY CLINIC NOTE  Date of Service: 05/14/2022  Patient Care Team: [name] (Inactive) as PCP - General (Surgery)  CHIEF COMPLAINTS/PURPOSE OF CONSULTATION:  Evaluation and management of  multiple myeloma   INTERVAL HISTORY: Patient was last seen by Dr.  Limbo on 04/29/2022. In the interim, patient started treatment with Dara/Velcade/Dex.   He was seen toda in the infusion center accompanied by his wife. He reports that he is tolerating the treatment without any new side effects. His energy is slowly improving after hospital discharge. He struggles with his appetite and has lost 20+ lbs over the past month. He denies nausea, vomiting or abdominal pain. He denies any bowel habits changes including recurrent episodes of diarrhea or constipation. He denies easy bruising or signs of active bleeding. He denies fevers, chills, sweats, shortness of breath, chest pain or cough. He has no other complaints.   MEDICAL HISTORY:  Past Medical History:  Diagnosis Date   Bleeding from the nose    Hypertension    Pre-diabetes     SURGICAL HISTORY: Past Surgical History:  Procedure Laterality Date   COLONOSCOPY     INTRAMEDULLARY (IM) NAIL INTERTROCHANTERIC Left 06/11/2020   Procedure: INTRAMEDULLARY (IM) NAIL INTERTROCHANTRIC;  Surgeon: Shona Needles, MD;  Location: [facility];  Service: Orthopedics;  Laterality: Left;   INTRAMEDULLARY (IM) NAIL INTERTROCHANTERIC Right 04/17/2022   Procedure: INTRAMEDULLARY (IM) NAIL INTERTROCHANTERIC;  Surgeon: Callie Fielding, MD;  Location: [facility];  Service: Orthopedics;  Laterality: Right;   KNEE ARTHROSCOPY Bilateral    POLYPECTOMY      SOCIAL HISTORY: Social History   Socioeconomic History   Marital status: Single    Spouse name: Not on file   Number of children: Not on file   Years of education: Not on file   Highest education level: Not on file  Occupational History   Not on file  Tobacco Use   Smoking status: Never    Smokeless tobacco: Never  Vaping Use   Vaping Use: Never used  Substance and Sexual Activity   Alcohol use: No    Alcohol/week: 0.0 standard drinks of alcohol   Drug use: No   Sexual activity: Not on file  Other Topics Concern   Not on file  Social History Narrative   Not on file   Social Determinants of Health   Financial Resource Strain: Not on file  Food Insecurity: Food Insecurity Present (04/29/2022)   Hunger Vital Sign    Worried About Running Out of Food in the Last Year: Never true    Ran Out of Food in the Last Year: Sometimes true  Transportation Needs: No Transportation Needs (04/29/2022)   PRAPARE - Hydrologist (Medical): No    Lack of Transportation (Non-Medical): No  Physical Activity: Not on file  Stress: Not on file  Social Connections: Not on file  Intimate Partner Violence: Not At Risk (04/29/2022)   Humiliation, Afraid, Rape, and Kick questionnaire    Fear of Current or Ex-Partner: No    Emotionally Abused: No    Physically Abused: No    Sexually Abused: No    FAMILY HISTORY: Family History  Problem Relation Age of Onset   Colon cancer Neg Hx     ALLERGIES:  has No Known Allergies.  MEDICATIONS:  Current Outpatient Medications  Medication Sig Dispense Refill   acyclovir (ZOVIRAX) 400 MG tablet Take 1 tablet (400 mg total) by mouth 2 (two) times daily. 60 tablet 5  amLODipine (NORVASC) 10 MG tablet Take 10 mg by mouth daily.     aspirin EC 81 MG tablet Take 1 tablet (81 mg total) by mouth 2 (two) times daily. Swallow whole. 84 tablet 0   atorvastatin (LIPITOR) 40 MG tablet Take 40 mg by mouth every evening.     DOXAZOSIN MESYLATE PO Take 2 mg by mouth every evening.     feeding supplement (ENSURE ENLIVE / ENSURE PLUS) LIQD Take 237 mLs by mouth 2 (two) times daily between meals. (Patient taking differently: Take 237 mLs by mouth daily. May use Boost) 237 mL 12   ferrous sulfate 325 (65 FE) MG EC tablet Take 1 tablet (325  mg total) by mouth 2 (two) times daily. 180 tablet 1   Multiple Vitamins-Iron (MULTIVITAMINS WITH IRON) TABS tablet Take 1 tablet by mouth daily.  0   ondansetron (ZOFRAN) 8 MG tablet Take 8 mg by mouth 30 to 60 min prior to Cyclophosphamide administration then take 8 mg every 8 hrs as needed for nausea and vomiting. 30 tablet 1   oxyCODONE (ROXICODONE) 5 MG immediate release tablet Take 1-2 tablets (5-10 mg total) by mouth every 4 (four) hours as needed for severe pain or moderate pain. 50 tablet 0   polyethylene glycol (MIRALAX) 17 g packet Take 17 g by mouth daily. 14 each 0   prochlorperazine (COMPAZINE) 10 MG tablet Take 1 tablet (10 mg total) by mouth every 6 (six) hours as needed for nausea or vomiting. 30 tablet 1   Vitamin D, Ergocalciferol, (DRISDOL) 1.25 MG (50000 UNIT) CAPS capsule Take 1 capsule (50,000 Units total) by mouth once a week. 5 capsule 0   No current facility-administered medications for this visit.   Facility-Administered Medications Ordered in Other Visits  Medication Dose Route Frequency Provider Last Rate Last Admin   acetaminophen (TYLENOL) tablet 650 mg  650 mg Oral Once Brunetta Genera, MD       bortezomib SQ (VELCADE) chemo injection (2.5mg /mL concentration) 2.75 mg  1.3 mg/m2 (Treatment Plan Recorded) Subcutaneous Once Brunetta Genera, MD       daratumumab-hyaluronidase-fihj [facility] FASPRO) 1800-30000 MG-UT/15ML chemo SQ injection 1,800 mg  1,800 mg Subcutaneous Once Brunetta Genera, MD       dexamethasone (DECADRON) tablet 20 mg  20 mg Oral Once Brunetta Genera, MD       diphenhydrAMINE (BENADRYL) capsule 50 mg  50 mg Oral Once Brunetta Genera, MD       famotidine (PEPCID) tablet 40 mg  40 mg Oral Once Brunetta Genera, MD       montelukast (SINGULAIR) tablet 10 mg  10 mg Oral Once Brunetta Genera, MD        REVIEW OF SYSTEMS:    10 Point review of Systems was done is negative except as noted above.  PHYSICAL  EXAMINATION: ECOG PERFORMANCE STATUS: 2 - Symptomatic, <50% confined to bed   Day 15, Cycle 1 05/14/22  Temp 97.9 F (36.6 C)  Temp src Oral  Pulse 69  Resp 16  BP 128/79    GENERAL:alert, in no acute distress and comfortable SKIN: no acute rashes, no significant lesions EYES: conjunctiva are pink and non-injected, sclera anicteric LYMPH:  no palpable lymphadenopathy in the cervical or supraclavicular regions LUNGS: clear to auscultation b/l with normal respiratory effort HEART: regular rate & rhythm Extremity: no pedal edema PSYCH: alert & oriented x 3 with fluent speech NEURO: no focal motor/sensory deficits  LABORATORY DATA:  I have  reviewed the data as listed .    Latest Ref Rng & Units 05/14/2022    1:23 PM 05/07/2022   10:15 AM 04/29/2022   10:41 AM  CBC  WBC 4.0 - 10.5 K/uL 3.6  3.9  6.4   Hemoglobin 13.0 - 17.0 g/dL 9.1  9.4  10.6   Hematocrit 39.0 - 52.0 % 26.8  28.5  31.5   Platelets 150 - 400 K/uL 169  253  274    .    Latest Ref Rng & Units 05/14/2022    1:23 PM 05/07/2022   10:15 AM 04/29/2022   10:41 AM  CMP  Glucose 70 - 99 mg/dL 173  168  128   BUN 8 - 23 mg/dL 25  18  13    Creatinine 0.61 - 1.24 mg/dL 1.23  1.14  1.04   Sodium 135 - 145 mmol/L 132  136  136   Potassium 3.5 - 5.1 mmol/L 4.2  4.2  4.1   Chloride 98 - 111 mmol/L 103  107  105   CO2 22 - 32 mmol/L 23  23  26    Calcium 8.9 - 10.3 mg/dL 8.8  8.8  8.7   Total Protein 6.5 - 8.1 g/dL 6.8  6.9  6.5   Total Bilirubin 0.3 - 1.2 mg/dL 1.0  0.8  0.8   Alkaline Phos 38 - 126 U/L 217  218  244   AST 15 - 41 U/L 28  33  42   ALT 0 - 44 U/L 18  20  21       04/22/2022 Bone Marrow Biopsy:    RADIOGRAPHIC STUDIES: I have personally reviewed the radiological images as listed and agreed with the findings in the report. ECHOCARDIOGRAM COMPLETE  Result Date: 04/22/2022    ECHOCARDIOGRAM REPORT   Patient Name:   JOSPH RIMANDO Date of Exam: 04/22/2022 Medical Rec #:  [id number]           Height:        69.5 in Accession #:    [id number]          Weight:       207.2 lb Date of Birth:  1940-09-01            BSA:          2.108 m Patient Age:    42 years            BP:           128/72 mmHg Patient Gender: M                   HR:           68 bpm. Exam Location:  Inpatient Procedure: 2D Echo and Intracardiac Opacification Agent Indications:    CHF  History:        Patient has prior history of Echocardiogram examinations and                 Patient has no prior history of Echocardiogram examinations.                 Risk Factors:Hypertension.  Sonographer:    Harvie Junior Referring Phys: [id number] Brunetta Genera  Sonographer Comments: Technically difficult study due to poor echo windows, suboptimal parasternal window, no subcostal window and patient is obese. Image acquisition challenging due to patient body habitus and Image acquisition challenging due to respiratory motion. IMPRESSIONS  1. Left ventricular ejection fraction, by estimation, is 60  to 65%. The left ventricle has normal function. The left ventricle has no regional wall motion abnormalities. Left ventricular diastolic parameters are indeterminate.  2. Right ventricular systolic function is normal. The right ventricular size is normal.  3. The mitral valve is grossly normal. No evidence of mitral valve regurgitation. No evidence of mitral stenosis.  4. The aortic valve was not well visualized. Aortic valve regurgitation is mild. Aortic valve sclerosis is present, with no evidence of aortic valve stenosis.  5. IVC not well visualized. FINDINGS  Left Ventricle: Left ventricular ejection fraction, by estimation, is 60 to 65%. The left ventricle has normal function. The left ventricle has no regional wall motion abnormalities. Definity contrast agent was given IV to delineate the left ventricular  endocardial borders. 3D left ventricular ejection fraction analysis performed but not reported based on interpreter judgement due to suboptimal tracking. The  left ventricular internal cavity size was normal in size. There is no left ventricular hypertrophy. Left ventricular diastolic parameters are indeterminate. Right Ventricle: The right ventricular size is normal. No increase in right ventricular wall thickness. Right ventricular systolic function is normal. Left Atrium: Left atrial size was normal in size. Right Atrium: Right atrial size was normal in size. Pericardium: There is no evidence of pericardial effusion. Mitral Valve: The mitral valve is grossly normal. No evidence of mitral valve regurgitation. No evidence of mitral valve stenosis. Tricuspid Valve: The tricuspid valve is normal in structure. Tricuspid valve regurgitation is trivial. No evidence of tricuspid stenosis. Aortic Valve: The aortic valve was not well visualized. Aortic valve regurgitation is mild. Aortic regurgitation PHT measures 632 msec. Aortic valve sclerosis is present, with no evidence of aortic valve stenosis. Aortic valve mean gradient measures 4.5 mmHg. Aortic valve peak gradient measures 8.7 mmHg. Aortic valve area, by VTI measures 2.69 cm. Pulmonic Valve: The pulmonic valve was normal in structure. Pulmonic valve regurgitation is not visualized. No evidence of pulmonic stenosis. Aorta: The aortic root is normal in size and structure. Venous: The inferior vena cava was not well visualized. IAS/Shunts: No atrial level shunt detected by color flow Doppler.  LEFT VENTRICLE PLAX 2D LVIDd:         4.20 cm     Diastology LVIDs:         3.00 cm     LV e' medial:    13.25 cm/s LV PW:         0.90 cm     LV E/e' medial:  8.3 LV IVS:        1.00 cm     LV e' lateral:   15.80 cm/s LVOT diam:     2.20 cm     LV E/e' lateral: 7.0 LV SV:         71 LV SV Index:   34 LVOT Area:     3.80 cm                             3D Volume EF: LV Volumes (MOD)           LV EDV:       126 ml LV vol d, MOD A2C: 45.3 ml LV ESV:       47 ml LV vol d, MOD A4C: 84.7 ml LV SV:        79 ml LV vol s, MOD A2C: 18.9 ml  LV vol s, MOD A4C: 29.6 ml LV SV MOD A2C:  26.4 ml LV SV MOD A4C:     84.7 ml LV SV MOD BP:      40.7 ml RIGHT VENTRICLE RV Basal diam:  3.80 cm RV Mid diam:    3.00 cm RV S prime:     15.55 cm/s TAPSE (M-mode): 2.0 cm LEFT ATRIUM             Index        RIGHT ATRIUM           Index LA Vol (A2C):   46.6 ml 22.11 ml/m  RA Area:     15.30 cm LA Vol (A4C):   40.9 ml 19.40 ml/m  RA Volume:   40.60 ml  19.26 ml/m LA Biplane Vol: 48.2 ml 22.87 ml/m  AORTIC VALVE                    PULMONIC VALVE AV Area (Vmax):    2.65 cm     PV Vmax:       1.06 m/s AV Area (Vmean):   2.62 cm     PV Peak grad:  4.5 mmHg AV Area (VTI):     2.69 cm AV Vmax:           147.50 cm/s AV Vmean:          94.975 cm/s AV VTI:            0.264 m AV Peak Grad:      8.7 mmHg AV Mean Grad:      4.5 mmHg LVOT Vmax:         102.85 cm/s LVOT Vmean:        65.400 cm/s LVOT VTI:          0.187 m LVOT/AV VTI ratio: 0.71 AI PHT:            632 msec  AORTA Ao Root diam: 3.50 cm MITRAL VALVE                TRICUSPID VALVE MV Area (PHT): 4.60 cm     TR Peak grad:   29.4 mmHg MV Decel Time: 165 msec     TR Vmax:        271.00 cm/s MV E velocity: 110.00 cm/s MV A velocity: 61.80 cm/s   SHUNTS MV E/A ratio:  1.78         Systemic VTI:  0.19 m                             Systemic Diam: 2.20 cm Cherlynn Kaiser MD Electronically signed by Cherlynn Kaiser MD Signature Date/Time: 04/22/2022/3:32:25 PM    Final    CT BONE MARROW BIOPSY & ASPIRATION  Result Date: 04/22/2022 INDICATION: Right hip biopsy demonstrated plasmacytoma. Bone marrow biopsy requested to evaluate for multiple myeloma. EXAM: CT GUIDED BONE MARROW ASPIRATES AND BIOPSY Physician: Stephan Minister. Henn, MD MEDICATIONS: Moderate sedation ANESTHESIA/SEDATION: Moderate (conscious) sedation was employed during this procedure. A total of Versed 2mg  and fentanyl 100 mcg was administered intravenously at the order of the provider performing the procedure. Total intra-service moderate sedation time: 10  minutes. Patient's level of consciousness and vital signs were monitored continuously by radiology nurse throughout the procedure under the supervision of the provider performing the procedure. COMPLICATIONS: None immediate. PROCEDURE: The procedure was explained to the patient. The risks and benefits of the procedure were discussed and the patient's questions were addressed. Informed consent was obtained from  the patient. The patient was placed prone on CT table. Images of the pelvis were obtained. The back was prepped and draped in sterile fashion. The skin and right posterior ilium were anesthetized with 1% lidocaine. 11 gauge bone needle was directed into the right ilium with CT guidance. Two aspirates and one core biopsy were obtained. Bandage placed over the puncture site. RADIATION DOSE REDUCTION: This exam was performed according to the departmental dose-optimization program which includes automated exposure control, adjustment of the mA and/or kV according to patient size and/or use of iterative reconstruction technique. FINDINGS: Subtle tiny lucent areas in the pelvis are concerning for underlying myeloma. Biopsy needle was directed into the posterior right ilium. IMPRESSION: CT guided bone marrow aspiration and core biopsy. Electronically Signed   By: Markus Daft M.D.   On: 04/22/2022 14:07   DG FEMUR, MIN 2 VIEWS RIGHT  Result Date: 04/17/2022 CLINICAL DATA:  Postop EXAM: RIGHT FEMUR 2 VIEWS COMPARISON:  04/13/2022. FINDINGS: Recent placement intramedullary rod in the femur and compression screw fixation of the right femoral neck with grossly anatomic alignment. No evidence of hardware failure. IMPRESSION: Unremarkable post op changes ORIF right femoral neck and femur. Electronically Signed   By: Sammie Bench M.D.   On: 04/17/2022 13:59   DG HIP UNILAT WITH PELVIS 1V RIGHT  Result Date: 04/17/2022 CLINICAL DATA:  Fluoro guidance provided EXAM: DG HIP (WITH OR WITHOUT PELVIS) 1V RIGHT FINDINGS:  Dose: 40.2 mGy Fluoro time 192s IMPRESSION: C-arm fluoro guidance provided. Electronically Signed   By: Sammie Bench M.D.   On: 04/17/2022 11:04   DG C-Arm 1-60 Min-No Report  Result Date: 04/17/2022 Fluoroscopy was utilized by the requesting physician.  No radiographic interpretation.   DG C-Arm 1-60 Min-No Report  Result Date: 04/17/2022 Fluoroscopy was utilized by the requesting physician.  No radiographic interpretation.   DG C-Arm 1-60 Min-No Report  Result Date: 04/17/2022 Fluoroscopy was utilized by the requesting physician.  No radiographic interpretation.    ASSESSMENT & PLAN:  GODWIN JARACZ is a 82 y.o. male who returns for a follow up for multiple myeloma.    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 Kappa light chain was elevated at 120.0, lambda free light chain 14.4 and ratio 8.33. --Underwent bone marrow biopsy on 04/22/2022 that showed 30% abnormal plasma cell and AL Amyloidosis. --Primary treatment includes Daratumumab, Velcade, and Dexamethasone, started on 04/30/2022.  --Started monthly Xgeva for bone support, started on 04/30/2022.     #2 Subacute pathologic intertrochanteric fracture of his right femur s/p IM nailing --Imaging shows lytic lesion in the area confirming the pathologic nature of the fracture. Patient has not had any trauma or falls leading to the fracture. --CT CAP from 04/14/2022 showed multiple other bony lytic lesions without any other clear source of a primary tumor.   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  --Bx from area of pathologic rt hip fracture confirmed daignosis of multiple myeloma.   #4 normocytic anemia.   -Hemoglobin is varying between 8-9.  Likely from metastatic malignancy/myeloma -Normal WBC count and platelets. 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  Could be from chronic hypertension. --UPEP with no  M spike and no significant proteiuria to suggest overt Myeloma kidney or renal amyloidosis.  PLAN: --Due for Cycle 1, Day 15 with velcade and daratumumab today --Labs from today were reviewed and adequate for treatment. WBC 3.6, Hgb 9.1, Plt 169. Creatinine and LFTs normal.  --Proceed with treatment today without any dose modifications. --Sent referral to [facility] to help with patient's diet with 20+ lb weight loss.  --Scheduled for PET scan on 05/20/2022.   FOLLOW-UP: -RTC on 05/21/2022 for labs, follow up visit with Dr. Derric Dealmeida Limbo before Cycle 1, Day 22.   All of the patient's questions were answered with apparent satisfaction. The patient knows to call the clinic with any problems, questions or concerns.   I have spent a total of 30 minutes minutes of face-to-face and non-face-to-face time, preparing to see the patient,  performing a medically appropriate examination, counseling and educating the patient, documenting clinical information in the electronic health record, and care coordination.   Dede Query PA-C Dept of Hematology and [place] at [facility] Phone: [phone]

## 2022-05-14 NOTE — Patient Instructions (Signed)
[facility]  Discharge Instructions: Thank you for choosing [facility] to provide your oncology and hematology care.   If you have a lab appointment with the Cancer Center, please go directly to the Cancer Center and check in at the registration area.   Wear comfortable clothing and clothing appropriate for easy access to any Portacath or PICC line.   We strive to give you quality time with your provider. You may need to reschedule your appointment if you arrive late (15 or more minutes).  Arriving late affects you and other patients whose appointments are after yours.  Also, if you miss three or more appointments without notifying the office, you may be dismissed from the clinic at the provider's discretion.      For prescription refill requests, have your pharmacy contact our office and allow 72 hours for refills to be completed.    Today you received the following chemotherapy and/or immunotherapy agents: bortezomib and daratumumab-hyaluronidase-fihj      To help prevent nausea and vomiting after your treatment, we encourage you to take your nausea medication as directed.  BELOW ARE SYMPTOMS THAT SHOULD BE REPORTED IMMEDIATELY: *FEVER GREATER THAN 100.4 F (38 C) OR HIGHER *CHILLS OR SWEATING *NAUSEA AND VOMITING THAT IS NOT CONTROLLED WITH YOUR NAUSEA MEDICATION *UNUSUAL SHORTNESS OF BREATH *UNUSUAL BRUISING OR BLEEDING *URINARY PROBLEMS (pain or burning when urinating, or frequent urination) *BOWEL PROBLEMS (unusual diarrhea, constipation, pain near the anus) TENDERNESS IN MOUTH AND THROAT WITH OR WITHOUT PRESENCE OF ULCERS (sore throat, sores in mouth, or a toothache) UNUSUAL RASH, SWELLING OR PAIN  UNUSUAL VAGINAL DISCHARGE OR ITCHING   Items with * indicate a potential emergency and should be followed up as soon as possible or go to the Emergency Department if any problems should occur.  Please show the CHEMOTHERAPY ALERT CARD  or IMMUNOTHERAPY ALERT CARD at check-in to the Emergency Department and triage nurse.  Should you have questions after your visit or need to cancel or reschedule your appointment, please contact [facility]  Dept: [phone]  and follow the prompts.  Office hours are 8:00 a.m. to 4:30 p.m. Monday - Friday. Please note that voicemails left after 4:00 p.m. may not be returned until the following business day.  We are closed weekends and major holidays. You have access to a nurse at all times for urgent questions. Please call the main number to the clinic Dept: [phone] and follow the prompts.   For any non-urgent questions, you may also contact your provider using MyChart. We now offer e-Visits for anyone 18 and older to request care online for non-urgent symptoms. For details visit [email or web address].   Also download the MyChart app! Go to the app store, search "MyChart", open the app, select , and log in with your MyChart username and password.   

## 2022-05-17 ENCOUNTER — Telehealth: Payer: Self-pay | Admitting: Hematology

## 2022-05-17 DIAGNOSIS — R630 Anorexia: Secondary | ICD-10-CM | POA: Diagnosis not present

## 2022-05-17 DIAGNOSIS — R5381 Other malaise: Secondary | ICD-10-CM | POA: Diagnosis not present

## 2022-05-17 DIAGNOSIS — R300 Dysuria: Secondary | ICD-10-CM | POA: Diagnosis not present

## 2022-05-17 DIAGNOSIS — R197 Diarrhea, unspecified: Secondary | ICD-10-CM | POA: Diagnosis not present

## 2022-05-17 NOTE — Telephone Encounter (Signed)
Per 3/18 IB reached out to patients wife, she will call back she has to line up transportation.

## 2022-05-19 DIAGNOSIS — R5381 Other malaise: Secondary | ICD-10-CM | POA: Diagnosis not present

## 2022-05-20 ENCOUNTER — Encounter (HOSPITAL_COMMUNITY)
Admission: RE | Admit: 2022-05-20 | Discharge: 2022-05-20 | Disposition: A | Discharge status: Home / Self Care | Payer: Medicare PPO | Source: Ambulatory Visit | Attending: Hematology | Admitting: Hematology

## 2022-05-20 DIAGNOSIS — C9000 Multiple myeloma not having achieved remission: Secondary | ICD-10-CM | POA: Insufficient documentation

## 2022-05-20 LAB — GLUCOSE, CAPILLARY: Glucose-Capillary: 83 mg/dL (ref 70–99)

## 2022-05-20 MED ORDER — FLUDEOXYGLUCOSE F - 18 (FDG) INJECTION
10.0000 | Freq: Once | INTRAVENOUS | Status: AC | PRN
  Administered 2022-05-20: 10.12 via INTRAVENOUS

## 2022-05-21 ENCOUNTER — Inpatient Hospital Stay (HOSPITAL_BASED_OUTPATIENT_CLINIC_OR_DEPARTMENT_OTHER): Payer: Medicare PPO | Admitting: Hematology

## 2022-05-21 ENCOUNTER — Inpatient Hospital Stay: Payer: Medicare PPO

## 2022-05-21 ENCOUNTER — Other Ambulatory Visit: Payer: Self-pay

## 2022-05-21 VITALS — BP 138/66 | HR 68 | Temp 97.2°F | Resp 18

## 2022-05-21 DIAGNOSIS — C9000 Multiple myeloma not having achieved remission: Secondary | ICD-10-CM | POA: Diagnosis not present

## 2022-05-21 DIAGNOSIS — R5381 Other malaise: Secondary | ICD-10-CM | POA: Diagnosis not present

## 2022-05-21 DIAGNOSIS — Z5112 Encounter for antineoplastic immunotherapy: Secondary | ICD-10-CM | POA: Diagnosis not present

## 2022-05-21 DIAGNOSIS — Z79624 Long term (current) use of inhibitors of nucleotide synthesis: Secondary | ICD-10-CM | POA: Diagnosis not present

## 2022-05-21 DIAGNOSIS — E8589 Other amyloidosis: Secondary | ICD-10-CM | POA: Diagnosis not present

## 2022-05-21 DIAGNOSIS — I130 Hypertensive heart and chronic kidney disease with heart failure and stage 1 through stage 4 chronic kidney disease, or unspecified chronic kidney disease: Secondary | ICD-10-CM | POA: Diagnosis not present

## 2022-05-21 DIAGNOSIS — Z79899 Other long term (current) drug therapy: Secondary | ICD-10-CM | POA: Diagnosis not present

## 2022-05-21 DIAGNOSIS — Z7189 Other specified counseling: Secondary | ICD-10-CM | POA: Diagnosis not present

## 2022-05-21 DIAGNOSIS — D649 Anemia, unspecified: Secondary | ICD-10-CM | POA: Diagnosis not present

## 2022-05-21 DIAGNOSIS — C7951 Secondary malignant neoplasm of bone: Secondary | ICD-10-CM | POA: Diagnosis not present

## 2022-05-21 DIAGNOSIS — Z7982 Long term (current) use of aspirin: Secondary | ICD-10-CM | POA: Diagnosis not present

## 2022-05-21 DIAGNOSIS — N183 Chronic kidney disease, stage 3 unspecified: Secondary | ICD-10-CM | POA: Diagnosis not present

## 2022-05-21 DIAGNOSIS — E8581 Light chain (AL) amyloidosis: Secondary | ICD-10-CM | POA: Diagnosis not present

## 2022-05-21 LAB — CBC WITH DIFFERENTIAL (CANCER CENTER ONLY)
Abs Immature Granulocytes: 0.02 10*3/uL (ref 0.00–0.07)
Basophils Absolute: 0 10*3/uL (ref 0.0–0.1)
Basophils Relative: 0 %
Eosinophils Absolute: 0 10*3/uL (ref 0.0–0.5)
Eosinophils Relative: 1 %
HCT: 26 % — ABNORMAL LOW (ref 39.0–52.0)
Hemoglobin: 8.8 g/dL — ABNORMAL LOW (ref 13.0–17.0)
Immature Granulocytes: 0 %
Lymphocytes Relative: 10 %
Lymphs Abs: 0.5 10*3/uL — ABNORMAL LOW (ref 0.7–4.0)
MCH: 31.3 pg (ref 26.0–34.0)
MCHC: 33.8 g/dL (ref 30.0–36.0)
MCV: 92.5 fL (ref 80.0–100.0)
Monocytes Absolute: 0.8 10*3/uL (ref 0.1–1.0)
Monocytes Relative: 17 %
Neutro Abs: 3.3 10*3/uL (ref 1.7–7.7)
Neutrophils Relative %: 72 %
Platelet Count: 157 10*3/uL (ref 150–400)
RBC: 2.81 MIL/uL — ABNORMAL LOW (ref 4.22–5.81)
RDW: 16.3 % — ABNORMAL HIGH (ref 11.5–15.5)
WBC Count: 4.6 10*3/uL (ref 4.0–10.5)
nRBC: 0 % (ref 0.0–0.2)

## 2022-05-21 LAB — CMP (CANCER CENTER ONLY)
ALT: 25 U/L (ref 0–44)
AST: 45 U/L — ABNORMAL HIGH (ref 15–41)
Albumin: 3.4 g/dL — ABNORMAL LOW (ref 3.5–5.0)
Alkaline Phosphatase: 187 U/L — ABNORMAL HIGH (ref 38–126)
Anion gap: 7 (ref 5–15)
BUN: 20 mg/dL (ref 8–23)
CO2: 23 mmol/L (ref 22–32)
Calcium: 8.6 mg/dL — ABNORMAL LOW (ref 8.9–10.3)
Chloride: 105 mmol/L (ref 98–111)
Creatinine: 1.17 mg/dL (ref 0.61–1.24)
GFR, Estimated: >60 mL/min (ref >60)
Glucose, Bld: 95 mg/dL (ref 70–99)
Potassium: 4.2 mmol/L (ref 3.5–5.1)
Sodium: 135 mmol/L (ref 135–145)
Total Bilirubin: 0.9 mg/dL (ref 0.3–1.2)
Total Protein: 6.1 g/dL — ABNORMAL LOW (ref 6.5–8.1)

## 2022-05-21 MED ORDER — FAMOTIDINE 20 MG PO TABS
40.0000 mg | ORAL_TABLET | Freq: Once | ORAL | Status: AC
  Administered 2022-05-21: 40 mg via ORAL
  Filled 2022-05-21: qty 2

## 2022-05-21 MED ORDER — BORTEZOMIB CHEMO SQ INJECTION 3.5 MG (2.5MG/ML)
1.3000 mg/m2 | Freq: Once | INTRAMUSCULAR | Status: AC
  Administered 2022-05-21: 2.75 mg via SUBCUTANEOUS
  Filled 2022-05-21: qty 1.1

## 2022-05-21 MED ORDER — DEXAMETHASONE 4 MG PO TABS
20.0000 mg | ORAL_TABLET | Freq: Once | ORAL | Status: AC
  Administered 2022-05-21: 20 mg via ORAL
  Filled 2022-05-21: qty 5

## 2022-05-21 MED ORDER — DIPHENHYDRAMINE HCL 25 MG PO CAPS
50.0000 mg | ORAL_CAPSULE | Freq: Once | ORAL | Status: AC
  Administered 2022-05-21: 50 mg via ORAL
  Filled 2022-05-21: qty 2

## 2022-05-21 MED ORDER — DARATUMUMAB-HYALURONIDASE-FIHJ 1800-30000 MG-UT/15ML ~~LOC~~ SOLN
1800.0000 mg | Freq: Once | SUBCUTANEOUS | Status: AC
  Administered 2022-05-21: 1800 mg via SUBCUTANEOUS
  Filled 2022-05-21: qty 15

## 2022-05-21 MED ORDER — ACETAMINOPHEN 325 MG PO TABS
650.0000 mg | ORAL_TABLET | Freq: Once | ORAL | Status: AC
  Administered 2022-05-21: 650 mg via ORAL
  Filled 2022-05-21: qty 2
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
## 2022-05-21 NOTE — Patient Instructions (Signed)
[facility]  Discharge Instructions: Thank you for choosing [facility] to provide your oncology and hematology care.   If you have a lab appointment with the Cancer Center, please go directly to the Cancer Center and check in at the registration area.   Wear comfortable clothing and clothing appropriate for easy access to any Portacath or PICC line.   We strive to give you quality time with your provider. You may need to reschedule your appointment if you arrive late (15 or more minutes).  Arriving late affects you and other patients whose appointments are after yours.  Also, if you miss three or more appointments without notifying the office, you may be dismissed from the clinic at the provider's discretion.      For prescription refill requests, have your pharmacy contact our office and allow 72 hours for refills to be completed.    Today you received the following chemotherapy and/or immunotherapy agents: bortezomib and daratumumab-hyaluronidase-fihj      To help prevent nausea and vomiting after your treatment, we encourage you to take your nausea medication as directed.  BELOW ARE SYMPTOMS THAT SHOULD BE REPORTED IMMEDIATELY: *FEVER GREATER THAN 100.4 F (38 C) OR HIGHER *CHILLS OR SWEATING *NAUSEA AND VOMITING THAT IS NOT CONTROLLED WITH YOUR NAUSEA MEDICATION *UNUSUAL SHORTNESS OF BREATH *UNUSUAL BRUISING OR BLEEDING *URINARY PROBLEMS (pain or burning when urinating, or frequent urination) *BOWEL PROBLEMS (unusual diarrhea, constipation, pain near the anus) TENDERNESS IN MOUTH AND THROAT WITH OR WITHOUT PRESENCE OF ULCERS (sore throat, sores in mouth, or a toothache) UNUSUAL RASH, SWELLING OR PAIN  UNUSUAL VAGINAL DISCHARGE OR ITCHING   Items with * indicate a potential emergency and should be followed up as soon as possible or go to the Emergency Department if any problems should occur.  Please show the CHEMOTHERAPY ALERT CARD  or IMMUNOTHERAPY ALERT CARD at check-in to the Emergency Department and triage nurse.  Should you have questions after your visit or need to cancel or reschedule your appointment, please contact [facility]  Dept: [phone]  and follow the prompts.  Office hours are 8:00 a.m. to 4:30 p.m. Monday - Friday. Please note that voicemails left after 4:00 p.m. may not be returned until the following business day.  We are closed weekends and major holidays. You have access to a nurse at all times for urgent questions. Please call the main number to the clinic Dept: [phone] and follow the prompts.   For any non-urgent questions, you may also contact your provider using MyChart. We now offer e-Visits for anyone 18 and older to request care online for non-urgent symptoms. For details visit [email or web address].   Also download the MyChart app! Go to the app store, search "MyChart", open the app, select [facility] [facility], and log in with your MyChart username and password.   

## 2022-05-21 NOTE — Progress Notes (Signed)
Patient seen by MD today  Vitals are within treatment parameters.  Labs reviewed: and are within treatment parameters.  Per physician team, patient is ready for treatment and there are NO modifications to the treatment plan.  

## 2022-05-24 ENCOUNTER — Other Ambulatory Visit: Payer: Self-pay

## 2022-05-24 DIAGNOSIS — N1831 Chronic kidney disease, stage 3a: Secondary | ICD-10-CM | POA: Diagnosis not present

## 2022-05-24 DIAGNOSIS — E785 Hyperlipidemia, unspecified: Secondary | ICD-10-CM | POA: Diagnosis not present

## 2022-05-24 DIAGNOSIS — C9000 Multiple myeloma not having achieved remission: Secondary | ICD-10-CM | POA: Diagnosis not present

## 2022-05-24 DIAGNOSIS — C7951 Secondary malignant neoplasm of bone: Secondary | ICD-10-CM | POA: Diagnosis not present

## 2022-05-24 DIAGNOSIS — E8589 Other amyloidosis: Secondary | ICD-10-CM | POA: Diagnosis not present

## 2022-05-24 DIAGNOSIS — Z7189 Other specified counseling: Secondary | ICD-10-CM

## 2022-05-24 DIAGNOSIS — S7221XD Displaced subtrochanteric fracture of right femur, subsequent encounter for closed fracture with routine healing: Secondary | ICD-10-CM | POA: Diagnosis not present

## 2022-05-24 DIAGNOSIS — D509 Iron deficiency anemia, unspecified: Secondary | ICD-10-CM | POA: Diagnosis not present

## 2022-05-24 DIAGNOSIS — I10 Essential (primary) hypertension: Secondary | ICD-10-CM | POA: Diagnosis not present

## 2022-05-24 MED ORDER — ACYCLOVIR 400 MG PO TABS: 400.0000 mg | ORAL_TABLET | Freq: Two times a day (BID) | ORAL | 5 refills | Status: DC

## 2022-05-24 MED ORDER — ONDANSETRON HCL 8 MG PO TABS: ORAL_TABLET | ORAL | 1 refills | Status: DC

## 2022-05-24 MED ORDER — PROCHLORPERAZINE MALEATE 10 MG PO TABS: 10.0000 mg | ORAL_TABLET | Freq: Four times a day (QID) | ORAL | 1 refills | Status: DC | PRN

## 2022-05-26 ENCOUNTER — Other Ambulatory Visit: Payer: Self-pay

## 2022-05-26 ENCOUNTER — Ambulatory Visit: Payer: Medicare PPO

## 2022-05-26 ENCOUNTER — Encounter: Payer: Medicare PPO | Admitting: Physician Assistant

## 2022-05-26 DIAGNOSIS — C9000 Multiple myeloma not having achieved remission: Secondary | ICD-10-CM

## 2022-05-26 NOTE — Progress Notes (Deleted)
Symptom Management Consult Note [facility]    Patient Care Team: [name] (Inactive) as PCP - General (Surgery)    Name / MRN / DOB: Frank Janssen  NP:[id number]  1940-12-04   Date of visit: 05/26/2022   Chief Complaint/Reason for visit: diarrhea   Current Therapy: Velcade and Darzalex Faspro Last treatment:  Day 22   Cycle 1 on 05/21/22   ASSESSMENT & PLAN: Patient is a 82 y.o. male  with oncologic history of multiple myeloma followed by Dr. Irene Limbo.  I have viewed most recent oncology note and lab work.    #Multiple myeloma - Next appointment with oncologist is 05/28/22   #       Heme/Onc History: Oncology History  Multiple myeloma not having achieved remission ([place])  04/21/2022 Initial Diagnosis   Multiple myeloma not having achieved remission ([place])   04/30/2022 -  Chemotherapy   Patient is on Treatment Plan : PRIMARY AMYLOIDOSIS DaraCyBorD (Daratumumab SQ + Cyclophosphamide PO + Bortezomib SQ + Dexamethasone PO/IV) q28d x 6 cycles / Daratumumab SQ q28d         Interval history-: SKYLAND FEIST is a 82 y.o. male with oncologic history as above presenting to [facility] today with chief complaint of      ROS  All other systems are reviewed and are negative for acute change except as noted in the HPI.    No Known Allergies   Past Medical History:  Diagnosis Date   Bleeding from the nose    Hypertension    Pre-diabetes      Past Surgical History:  Procedure Laterality Date   COLONOSCOPY     INTRAMEDULLARY (IM) NAIL INTERTROCHANTERIC Left 06/11/2020   Procedure: INTRAMEDULLARY (IM) NAIL INTERTROCHANTRIC;  Surgeon: Shona Needles, MD;  Location: [facility];  Service: Orthopedics;  Laterality: Left;   INTRAMEDULLARY (IM) NAIL INTERTROCHANTERIC Right 04/17/2022   Procedure: INTRAMEDULLARY (IM) NAIL INTERTROCHANTERIC;  Surgeon: Callie Fielding, MD;  Location: [facility];  Service: Orthopedics;  Laterality: Right;   KNEE ARTHROSCOPY  Bilateral    POLYPECTOMY      Social History   Socioeconomic History   Marital status: Single    Spouse name: Not on file   Number of children: Not on file   Years of education: Not on file   Highest education level: Not on file  Occupational History   Not on file  Tobacco Use   Smoking status: Never   Smokeless tobacco: Never  Vaping Use   Vaping Use: Never used  Substance and Sexual Activity   Alcohol use: No    Alcohol/week: 0.0 standard drinks of alcohol   Drug use: No   Sexual activity: Not on file  Other Topics Concern   Not on file  Social History Narrative   Not on file   Social Determinants of Health   Financial Resource Strain: Not on file  Food Insecurity: Food Insecurity Present (04/29/2022)   Hunger Vital Sign    Worried About Running Out of Food in the Last Year: Never true    Ran Out of Food in the Last Year: Sometimes true  Transportation Needs: No Transportation Needs (04/29/2022)   PRAPARE - Hydrologist (Medical): No    Lack of Transportation (Non-Medical): No  Physical Activity: Not on file  Stress: Not on file  Social Connections: Not on file  Intimate Partner Violence: Not At Risk (04/29/2022)   Humiliation, Afraid, Rape, and Kick questionnaire  Fear of Current or Ex-Partner: No    Emotionally Abused: No    Physically Abused: No    Sexually Abused: No    Family History  Problem Relation Age of Onset   Colon cancer Neg Hx      Current Outpatient Medications:    acyclovir (ZOVIRAX) 400 MG tablet, Take 1 tablet (400 mg total) by mouth 2 (two) times daily., Disp: 60 tablet, Rfl: 5   amLODipine (NORVASC) 10 MG tablet, Take 10 mg by mouth daily., Disp: , Rfl:    aspirin EC 81 MG tablet, Take 1 tablet (81 mg total) by mouth 2 (two) times daily. Swallow whole., Disp: 84 tablet, Rfl: 0   atorvastatin (LIPITOR) 40 MG tablet, Take 40 mg by mouth every evening., Disp: , Rfl:    DOXAZOSIN MESYLATE PO, Take 2 mg by mouth  every evening., Disp: , Rfl:    feeding supplement (ENSURE ENLIVE / ENSURE PLUS) LIQD, Take 237 mLs by mouth 2 (two) times daily between meals. (Patient taking differently: Take 237 mLs by mouth daily. May use Boost), Disp: 237 mL, Rfl: 12   ferrous sulfate 325 (65 FE) MG EC tablet, Take 1 tablet (325 mg total) by mouth 2 (two) times daily., Disp: 180 tablet, Rfl: 1   Multiple Vitamins-Iron (MULTIVITAMINS WITH IRON) TABS tablet, Take 1 tablet by mouth daily., Disp: , Rfl: 0   ondansetron (ZOFRAN) 8 MG tablet, Take 8 mg by mouth 30 to 60 min prior to Cyclophosphamide administration then take 8 mg every 8 hrs as needed for nausea and vomiting., Disp: 30 tablet, Rfl: 1   oxyCODONE (ROXICODONE) 5 MG immediate release tablet, Take 1-2 tablets (5-10 mg total) by mouth every 4 (four) hours as needed for severe pain or moderate pain., Disp: 50 tablet, Rfl: 0   polyethylene glycol (MIRALAX) 17 g packet, Take 17 g by mouth daily., Disp: 14 each, Rfl: 0   prochlorperazine (COMPAZINE) 10 MG tablet, Take 1 tablet (10 mg total) by mouth every 6 (six) hours as needed for nausea or vomiting., Disp: 30 tablet, Rfl: 1   Vitamin D, Ergocalciferol, (DRISDOL) 1.25 MG (50000 UNIT) CAPS capsule, Take 1 capsule (50,000 Units total) by mouth once a week., Disp: 5 capsule, Rfl: 0  PHYSICAL EXAM: ECOG FS:{CHL ONC [id number]   There were no vitals filed for this visit. Physical Exam     LABORATORY DATA: I have reviewed the data as listed    Latest Ref Rng & Units 05/21/2022    1:13 PM 05/14/2022    1:23 PM 05/07/2022   10:15 AM  CBC  WBC 4.0 - 10.5 K/uL 4.6  3.6  3.9   Hemoglobin 13.0 - 17.0 g/dL 8.8  9.1  9.4   Hematocrit 39.0 - 52.0 % 26.0  26.8  28.5   Platelets 150 - 400 K/uL 157  169  253         Latest Ref Rng & Units 05/21/2022    1:13 PM 05/14/2022    1:23 PM 05/07/2022   10:15 AM  CMP  Glucose 70 - 99 mg/dL 95  173  168   BUN 8 - 23 mg/dL 20  25  18    Creatinine 0.61 - 1.24 mg/dL 1.17  1.23  1.14    Sodium 135 - 145 mmol/L 135  132  136   Potassium 3.5 - 5.1 mmol/L 4.2  4.2  4.2   Chloride 98 - 111 mmol/L 105  103  107   CO2 22 -  32 mmol/L 23  23  23    Calcium 8.9 - 10.3 mg/dL 8.6  8.8  8.8   Total Protein 6.5 - 8.1 g/dL 6.1  6.8  6.9   Total Bilirubin 0.3 - 1.2 mg/dL 0.9  1.0  0.8   Alkaline Phos 38 - 126 U/L 187  217  218   AST 15 - 41 U/L 45  28  33   ALT 0 - 44 U/L 25  18  20         RADIOGRAPHIC STUDIES (from last 24 hours if applicable) I have personally reviewed the radiological images as listed and agreed with the findings in the report. No results found.      Visit Diagnosis: No diagnosis found.   No orders of the defined types were placed in this encounter.   All questions were answered. The patient knows to call the clinic with any problems, questions or concerns. No barriers to learning was detected.  I have spent a total of *** minutes minutes of face-to-face and non-face-to-face time, preparing to see the patient, obtaining and/or reviewing separately obtained history, performing a medically appropriate examination, counseling and educating the patient, ordering tests, documenting clinical information in the electronic health record, and care coordination (communications with other health care professionals or caregivers).    Thank you for allowing me to participate in the care of this patient.    Barrie Folk, PA-C Department of Hematology/Oncology [facility] at [facility] Phone: [phone]  [phone]    05/26/2022 10:30 AM

## 2022-05-26 NOTE — Progress Notes (Addendum)
[facility] OFFICE PROGRESS NOTE  [name] (Inactive) [place]  DIAGNOSIS: Evaluation and management of multiple myeloma   CURRENT THERAPY: Dara/Velcade/Dex.   INTERVAL HISTORY: GEORDON SOLIN 82 y.o. male returns to the clinic today for a follow-up visit accompanied by his wife.  The patient is followed by Dr. Irene Limbo for multiple myeloma.  I am unfamiliar with this patient but he is hypotensive, experiencing weakness/inability to stand, altered mental status,and diarrhea.    The patient's wife has been reiterating that he is "way off" today.  The patient is oriented to person and city.  He believes the year is 88 and the month is February. The patient is giggling inappropriately to the situation in the exam room today. There are a lot of discrepancies between the history obtained by the patient vs. His wife. His wife states after he had his lab work drawn in the lobby he almost "went out" and was not answering/responding to her questions. However, he denies any lightheadedness or dizziness. He did not eat breakfast today. Denies diabetes.  He took his antihypertensive this morning.   The patient's wife also states that he has been having black diarrhea since he had his surgery in February 2024 for ORIF of the right hip.  She estimates he has 3 black stools per day.  Of note, the patient is taking an iron supplement. His wife is not able to tell me if the black stool occurred before or after starting the iron supplement.  They deny any fevers.  Denies any dysuria, cough, sore throat, or skin infections.  Denies any headache or visual changes.  Denies any falls or recent head injuries.  He is unclear what his baseline activity is at home.  He was supposed to undergo treatment with Velcade and daratumumab today.         MEDICAL HISTORY: Past Medical History:  Diagnosis Date   Bleeding from the nose    Hypertension    Pre-diabetes     ALLERGIES:  has No Known  Allergies.  MEDICATIONS:  Current Outpatient Medications  Medication Sig Dispense Refill   acyclovir (ZOVIRAX) 400 MG tablet Take 1 tablet (400 mg total) by mouth 2 (two) times daily. 60 tablet 5   amLODipine (NORVASC) 10 MG tablet Take 10 mg by mouth daily.     aspirin EC 81 MG tablet Take 1 tablet (81 mg total) by mouth 2 (two) times daily. Swallow whole. 84 tablet 0   atorvastatin (LIPITOR) 40 MG tablet Take 40 mg by mouth every evening.     DOXAZOSIN MESYLATE PO Take 2 mg by mouth every evening.     feeding supplement (ENSURE ENLIVE / ENSURE PLUS) LIQD Take 237 mLs by mouth 2 (two) times daily between meals. (Patient taking differently: Take 237 mLs by mouth daily. May use Boost) 237 mL 12   ferrous sulfate 325 (65 FE) MG EC tablet Take 1 tablet (325 mg total) by mouth 2 (two) times daily. 180 tablet 1   Multiple Vitamins-Iron (MULTIVITAMINS WITH IRON) TABS tablet Take 1 tablet by mouth daily.  0   ondansetron (ZOFRAN) 8 MG tablet Take 8 mg by mouth 30 to 60 min prior to Cyclophosphamide administration then take 8 mg every 8 hrs as needed for nausea and vomiting. 30 tablet 1   oxyCODONE (ROXICODONE) 5 MG immediate release tablet Take 1-2 tablets (5-10 mg total) by mouth every 4 (four) hours as needed for severe pain or moderate pain. 50 tablet 0  polyethylene glycol (MIRALAX) 17 g packet Take 17 g by mouth daily. 14 each 0   prochlorperazine (COMPAZINE) 10 MG tablet Take 1 tablet (10 mg total) by mouth every 6 (six) hours as needed for nausea or vomiting. 30 tablet 1   Vitamin D, Ergocalciferol, (DRISDOL) 1.25 MG (50000 UNIT) CAPS capsule Take 1 capsule (50,000 Units total) by mouth once a week. 5 capsule 0   No current facility-administered medications for this visit.    SURGICAL HISTORY:  Past Surgical History:  Procedure Laterality Date   COLONOSCOPY     INTRAMEDULLARY (IM) NAIL INTERTROCHANTERIC Left 06/11/2020   Procedure: INTRAMEDULLARY (IM) NAIL INTERTROCHANTRIC;  Surgeon:  Shona Needles, MD;  Location: [facility] [place];  Service: Orthopedics;  Laterality: Left;   INTRAMEDULLARY (IM) NAIL INTERTROCHANTERIC Right 04/17/2022   Procedure: INTRAMEDULLARY (IM) NAIL INTERTROCHANTERIC;  Surgeon: Callie Fielding, MD;  Location: [facility];  Service: Orthopedics;  Laterality: Right;   KNEE ARTHROSCOPY Bilateral    POLYPECTOMY      REVIEW OF SYSTEMS:   Review of Systems  Constitutional: Positive for weakness and decreased appetite. Negative for chills, fever and unexpected weight change.  HENT: Negative for mouth sores, nosebleeds, sore throat and trouble swallowing.   Eyes: Negative for eye problems and icterus.  Respiratory: Negative for cough, hemoptysis, shortness of breath and wheezing.   Cardiovascular: Negative for chest pain and leg swelling.  Gastrointestinal: Positive for diarrhea.  Negative for abdominal pain, constipation,  nausea and vomiting.  Genitourinary: Negative for bladder incontinence, difficulty urinating, dysuria, frequency and hematuria.   Musculoskeletal: Negative for back pain, gait problem, neck pain and neck stiffness.  Skin: Negative for itching and rash.  Neurological: Negative for dizziness, extremity weakness, gait problem, headaches, light-headedness and seizures.  Hematological: Negative for adenopathy. Does not bruise/bleed easily.  Psychiatric/Behavioral: Positive for AMS. Negative for depression and sleep disturbance. The patient is not nervous/anxious.     PHYSICAL EXAMINATION:  There were no vitals taken for this visit.  ECOG PERFORMANCE STATUS: 3  Physical Exam  Constitutional: Oriented to person and place.  Believes the year is 62 and the month is February. and well-developed, well-nourished, and in no distress.  HENT:  Head: Normocephalic and atraumatic.  Mouth/Throat: Oropharynx is clear and moist. No oropharyngeal exudate.  Eyes: Conjunctivae are normal. Right eye exhibits no discharge. Left eye exhibits no discharge. No scleral  icterus.  Neck: Normal range of motion. Neck supple.  Cardiovascular: Normal rate, regular rhythm, normal heart sounds and intact distal pulses.   Pulmonary/Chest: Effort normal and breath sounds normal. No respiratory distress. No wheezes. No rales.  Abdominal: Soft. Bowel sounds are normal. Exhibits no distension and no mass. There is no tenderness.  Musculoskeletal: Normal range of motion. Exhibits no edema.  Lymphadenopathy:    No cervical adenopathy.  Neurological: Alert and oriented to person and place.  He is not oriented to time.  Exhibits muscle wasting.  He was examined in the wheelchair.  Skin: Skin is warm and dry. No rash noted. Not diaphoretic. No erythema. No pallor.  Psychiatric: Mood.  The patient was giggling in the room randomly.  The patient is oriented to person but believes the year is 22. Vitals reviewed.  LABORATORY DATA: Lab Results  Component Value Date   WBC 4.6 05/21/2022   HGB 8.8 (L) 05/21/2022   HCT 26.0 (L) 05/21/2022   MCV 92.5 05/21/2022   PLT 157 05/21/2022      Chemistry      Component Value Date/Time  NA 135 05/21/2022 1313   K 4.2 05/21/2022 1313   CL 105 05/21/2022 1313   CO2 23 05/21/2022 1313   BUN 20 05/21/2022 1313   CREATININE 1.17 05/21/2022 1313      Component Value Date/Time   CALCIUM 8.6 (L) 05/21/2022 1313   ALKPHOS 187 (H) 05/21/2022 1313   AST 45 (H) 05/21/2022 1313   ALT 25 05/21/2022 1313   BILITOT 0.9 05/21/2022 1313       RADIOGRAPHIC STUDIES:  NM PET Image Initial (PI) Whole Body  Result Date: 05/23/2022 CLINICAL DATA:  Initial treatment strategy for multiple myeloma. EXAM: NUCLEAR MEDICINE PET WHOLE BODY TECHNIQUE: 10.1 mCi F-18 FDG was injected intravenously. Full-ring PET imaging was performed from the head to foot after the radiotracer. CT data was obtained and used for attenuation correction and anatomic localization. Fasting blood glucose: 83 mg/dl COMPARISON:  CT chest abdomen pelvis dated 04/14/2022  FINDINGS: Mediastinal blood pool activity: SUV max 2.6 HEAD/NECK: No hypermetabolic activity in the scalp. No hypermetabolic cervical lymph nodes. 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 Incidental CT findings: none CHEST: No suspicious pulmonary nodules. No hypermetabolic thoracic lymphadenopathy. Incidental CT findings: Moderate coronary atherosclerosis of the LAD and right coronary artery. ABDOMEN/PELVIS: No abnormal hypermetabolism in the liver, spleen, pancreas, or adrenal glands. No hypermetabolic abdominopelvic lymphadenopathy. Incidental CT findings: Bilateral renal cysts, measuring up to 3.3 cm in the left lower pole, benign (Bosniak I). No follow-up is recommended. Sigmoid diverticulosis, without evidence of diverticulitis. Moderate right inguinal/scrotal hernia containing the right anterior bladder (series 4/image 205) and loops of nondilated small bowel (series 4/image 222), without evidence of obstruction. Small fat containing left inguinal hernia. Atherosclerotic calcifications the abdominal aorta and branch vessels. SKELETON: Status post ORIF of the right hip with associated hypermetabolism. Known underlying pathologic fracture. Otherwise, no focal hypermetabolic activity to suggest skeletal metastasis or active myeloma. Specifically, the lytic lesion along the left posterior aspect of the L2 vertebral body (series 4/image 149) is non FDG avid, max SUV 1.5. The lytic lesion along the left iliac bone/acetabulum (series 4/image 195) is essentially non FDG avid, max SUV 1.9. Incidental CT findings: Status post ORIF of the left hip. Degenerative changes of the visualized thoracolumbar spine. EXTREMITIES: No abnormal hypermetabolic activity in the lower extremities. Incidental CT findings: none IMPRESSION: Status post ORIF of the right hip with known underlying pathologic fracture. Additional lytic lesions involving the L2 vertebral  body and left iliac bone/acetabulum are non FDG avid. No findings suggestive of active myeloma. 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 If clinically warranted given the patient's age/comorbidities, consider ENT consultation. Additional ancillary findings as above. Electronically Signed   By: Julian Hy M.D.   On: 05/23/2022 03:12     ASSESSMENT/PLAN:  OSMAN CASE is a 82 y.o. male who returns for a follow up for multiple myeloma.    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 Kappa light chain was elevated at 120.0, lambda free light chain 14.4 and ratio 8.33. --Underwent bone marrow biopsy on 04/22/2022 that showed 30% abnormal plasma cell and AL Amyloidosis. --Primary treatment includes Daratumumab, Velcade, and Dexamethasone, started on 04/30/2022.  --Started monthly Xgeva for bone support, started on 04/30/2022.    --Today, the patient has AKI, hypokalemia, hypotension, altered mental status, and diarrhea -  He will not receive any treatment today.  Instead, he will be sent to the emergency room for further evaluation and management.  According to his wife, the patient is "way off" today. He is not oriented to year or month today. He was giggling inappropriately to the situation in the exam room today.  -Will keep his follow-up appointment on 06/04/2022 as scheduled for reassessment.  #2 Subacute pathologic intertrochanteric fracture of his right femur s/p IM nailing --Imaging shows lytic lesion in the area confirming the pathologic nature of the fracture. Patient has not had any trauma or falls leading to the fracture. --CT CAP from 04/14/2022 showed multiple other bony lytic lesions without any other clear source of a primary tumor. -Reviewed PET scan from 05/20/22 showing non-FDG avid  additional lytic lesions of L2 and left iliac bone/acetabulum.    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  --Bx from area of pathologic rt hip fracture confirmed daignosis of multiple myeloma. --Reviewed PET scan from 05/20/22 showing non-FDG avid additional lytic lesions of L2 and left iliac bone/acetabulum.    #4 normocytic anemia.   -Hemoglobin is varying between 8-9.  Likely from metastatic malignancy/myeloma -Normal WBC count and platelets. 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  Could be from chronic hypertension. --UPEP with no M spike and no significant proteiuria to suggest overt Myeloma kidney or renal amyloidosis. -Has acute on chronic kidney injury today with creatinine of 2.21 (baseline around 1.1-1.2)  #6. Diarrhea/hypokalemia -The patient has been having black diarrhea since his surgery in February 2024 -Unclear if black stool related to iron supplement.  -Significant hypokalemia on labs today with K of 2.5. Likely secondary to diarrhea and poor p.o. intake.     #7.  Hypotension and poor PO intake -The patient is hypotensive today, which per chart review, is unusual for him.  He does have a blood pressure cuff at home.  He did take Norvasc this today as prescribed. -Likely dehydrated from diarrhea and poor po intake   PLAN: --Send emergency room for hypotension, hypokalemia, diarrhea, altered mental status, and acute kidney injury.  --Scheduled for PET scan on 05/20/2022 reviewed   FOLLOW-UP: -RTC as scheduled on 06/04/2022 for labs, follow up visit with Murray Hodgkins before Cycle 2, Day 1.   All of the patient's questions were answered with apparent satisfaction. The patient knows to call the clinic with any problems, questions or concerns.  No orders of the  defined types were placed in this encounter.    The total time spent in the appointment was 30-39 minutes.   Mailin Coglianese L Hara [place], PA-C 05/26/22

## 2022-05-27 ENCOUNTER — Encounter: Payer: Self-pay | Admitting: Hematology

## 2022-05-28 ENCOUNTER — Inpatient Hospital Stay: Payer: Medicare PPO | Admitting: Physician Assistant

## 2022-05-28 ENCOUNTER — Inpatient Hospital Stay (HOSPITAL_BASED_OUTPATIENT_CLINIC_OR_DEPARTMENT_OTHER): Payer: Medicare PPO | Admitting: Physician Assistant

## 2022-05-28 ENCOUNTER — Encounter (HOSPITAL_COMMUNITY): Payer: Self-pay

## 2022-05-28 ENCOUNTER — Other Ambulatory Visit: Payer: Self-pay

## 2022-05-28 ENCOUNTER — Inpatient Hospital Stay: Payer: Medicare PPO

## 2022-05-28 ENCOUNTER — Inpatient Hospital Stay (HOSPITAL_COMMUNITY)
Admission: EM | Admit: 2022-05-28 | Discharge: 2022-06-01 | DRG: 640 | Disposition: A | Discharge status: Home / Self Care | Payer: Medicare PPO | Attending: Internal Medicine | Admitting: Internal Medicine

## 2022-05-28 DIAGNOSIS — E861 Hypovolemia: Secondary | ICD-10-CM | POA: Diagnosis present

## 2022-05-28 DIAGNOSIS — C9000 Multiple myeloma not having achieved remission: Secondary | ICD-10-CM | POA: Diagnosis present

## 2022-05-28 DIAGNOSIS — D649 Anemia, unspecified: Secondary | ICD-10-CM | POA: Diagnosis not present

## 2022-05-28 DIAGNOSIS — Z8551 Personal history of malignant neoplasm of bladder: Secondary | ICD-10-CM | POA: Diagnosis not present

## 2022-05-28 DIAGNOSIS — I498 Other specified cardiac arrhythmias: Secondary | ICD-10-CM | POA: Diagnosis present

## 2022-05-28 DIAGNOSIS — I10 Essential (primary) hypertension: Secondary | ICD-10-CM | POA: Diagnosis not present

## 2022-05-28 DIAGNOSIS — D630 Anemia in neoplastic disease: Secondary | ICD-10-CM | POA: Diagnosis present

## 2022-05-28 DIAGNOSIS — Z79899 Other long term (current) drug therapy: Secondary | ICD-10-CM

## 2022-05-28 DIAGNOSIS — I440 Atrioventricular block, first degree: Secondary | ICD-10-CM | POA: Diagnosis present

## 2022-05-28 DIAGNOSIS — Z5982 Transportation insecurity: Secondary | ICD-10-CM

## 2022-05-28 DIAGNOSIS — I469 Cardiac arrest, cause unspecified: Secondary | ICD-10-CM | POA: Diagnosis not present

## 2022-05-28 DIAGNOSIS — I455 Other specified heart block: Secondary | ICD-10-CM | POA: Diagnosis not present

## 2022-05-28 DIAGNOSIS — R7303 Prediabetes: Secondary | ICD-10-CM | POA: Diagnosis present

## 2022-05-28 DIAGNOSIS — C7951 Secondary malignant neoplasm of bone: Secondary | ICD-10-CM

## 2022-05-28 DIAGNOSIS — E876 Hypokalemia: Principal | ICD-10-CM | POA: Diagnosis present

## 2022-05-28 DIAGNOSIS — E860 Dehydration: Secondary | ICD-10-CM | POA: Diagnosis present

## 2022-05-28 DIAGNOSIS — C679 Malignant neoplasm of bladder, unspecified: Secondary | ICD-10-CM

## 2022-05-28 DIAGNOSIS — E871 Hypo-osmolality and hyponatremia: Secondary | ICD-10-CM | POA: Diagnosis present

## 2022-05-28 DIAGNOSIS — N179 Acute kidney failure, unspecified: Secondary | ICD-10-CM | POA: Diagnosis present

## 2022-05-28 DIAGNOSIS — I129 Hypertensive chronic kidney disease with stage 1 through stage 4 chronic kidney disease, or unspecified chronic kidney disease: Secondary | ICD-10-CM | POA: Diagnosis present

## 2022-05-28 DIAGNOSIS — I9589 Other hypotension: Secondary | ICD-10-CM | POA: Diagnosis present

## 2022-05-28 DIAGNOSIS — I4891 Unspecified atrial fibrillation: Secondary | ICD-10-CM | POA: Diagnosis present

## 2022-05-28 DIAGNOSIS — N1831 Chronic kidney disease, stage 3a: Secondary | ICD-10-CM | POA: Diagnosis present

## 2022-05-28 DIAGNOSIS — Z5941 Food insecurity: Secondary | ICD-10-CM | POA: Diagnosis not present

## 2022-05-28 DIAGNOSIS — L89613 Pressure ulcer of right heel, stage 3: Secondary | ICD-10-CM | POA: Diagnosis present

## 2022-05-28 DIAGNOSIS — E872 Acidosis, unspecified: Secondary | ICD-10-CM | POA: Diagnosis present

## 2022-05-28 DIAGNOSIS — A0472 Enterocolitis due to Clostridium difficile, not specified as recurrent: Secondary | ICD-10-CM | POA: Diagnosis present

## 2022-05-28 DIAGNOSIS — R197 Diarrhea, unspecified: Secondary | ICD-10-CM | POA: Diagnosis present

## 2022-05-28 DIAGNOSIS — I959 Hypotension, unspecified: Secondary | ICD-10-CM | POA: Diagnosis present

## 2022-05-28 DIAGNOSIS — E8581 Light chain (AL) amyloidosis: Secondary | ICD-10-CM | POA: Diagnosis present

## 2022-05-28 DIAGNOSIS — R001 Bradycardia, unspecified: Secondary | ICD-10-CM | POA: Diagnosis not present

## 2022-05-28 DIAGNOSIS — R55 Syncope and collapse: Secondary | ICD-10-CM | POA: Diagnosis present

## 2022-05-28 DIAGNOSIS — Z7189 Other specified counseling: Secondary | ICD-10-CM

## 2022-05-28 HISTORY — DX: Secondary malignant neoplasm of bone: C67.9

## 2022-05-28 HISTORY — DX: Malignant neoplasm of bladder, unspecified: C79.51

## 2022-05-28 LAB — CBC WITH DIFFERENTIAL (CANCER CENTER ONLY)
Abs Immature Granulocytes: 0.01 10*3/uL (ref 0.00–0.07)
Basophils Absolute: 0 10*3/uL (ref 0.0–0.1)
Basophils Relative: 0 %
Eosinophils Absolute: 0.1 10*3/uL (ref 0.0–0.5)
Eosinophils Relative: 2 %
HCT: 26.1 % — ABNORMAL LOW (ref 39.0–52.0)
Hemoglobin: 8.9 g/dL — ABNORMAL LOW (ref 13.0–17.0)
Immature Granulocytes: 0 %
Lymphocytes Relative: 9 %
Lymphs Abs: 0.5 10*3/uL — ABNORMAL LOW (ref 0.7–4.0)
MCH: 30.8 pg (ref 26.0–34.0)
MCHC: 34.1 g/dL (ref 30.0–36.0)
MCV: 90.3 fL (ref 80.0–100.0)
Monocytes Absolute: 0.8 10*3/uL (ref 0.1–1.0)
Monocytes Relative: 15 %
Neutro Abs: 4 10*3/uL (ref 1.7–7.7)
Neutrophils Relative %: 74 %
Platelet Count: 194 10*3/uL (ref 150–400)
RBC: 2.89 MIL/uL — ABNORMAL LOW (ref 4.22–5.81)
RDW: 16.2 % — ABNORMAL HIGH (ref 11.5–15.5)
WBC Count: 5.4 10*3/uL (ref 4.0–10.5)
nRBC: 0 % (ref 0.0–0.2)

## 2022-05-28 LAB — CREATININE, SERUM
Creatinine, Ser: 1.88 mg/dL — ABNORMAL HIGH (ref 0.61–1.24)
GFR, Estimated: 35 mL/min — ABNORMAL LOW (ref >60)

## 2022-05-28 LAB — CMP (CANCER CENTER ONLY)
ALT: 23 U/L (ref 0–44)
AST: 33 U/L (ref 15–41)
Albumin: 3.5 g/dL (ref 3.5–5.0)
Alkaline Phosphatase: 171 U/L — ABNORMAL HIGH (ref 38–126)
Anion gap: 12 (ref 5–15)
BUN: 29 mg/dL — ABNORMAL HIGH (ref 8–23)
CO2: 19 mmol/L — ABNORMAL LOW (ref 22–32)
Calcium: 8.5 mg/dL — ABNORMAL LOW (ref 8.9–10.3)
Chloride: 104 mmol/L (ref 98–111)
Creatinine: 2.24 mg/dL — ABNORMAL HIGH (ref 0.61–1.24)
GFR, Estimated: 29 mL/min — ABNORMAL LOW (ref >60)
Glucose, Bld: 97 mg/dL (ref 70–99)
Potassium: 2.6 mmol/L — CL (ref 3.5–5.1)
Sodium: 135 mmol/L (ref 135–145)
Total Bilirubin: 0.9 mg/dL (ref 0.3–1.2)
Total Protein: 6.3 g/dL — ABNORMAL LOW (ref 6.5–8.1)

## 2022-05-28 LAB — BASIC METABOLIC PANEL
Anion gap: 9 (ref 5–15)
BUN: 27 mg/dL — ABNORMAL HIGH (ref 8–23)
CO2: 18 mmol/L — ABNORMAL LOW (ref 22–32)
Calcium: 8.3 mg/dL — ABNORMAL LOW (ref 8.9–10.3)
Chloride: 107 mmol/L (ref 98–111)
Creatinine, Ser: 1.76 mg/dL — ABNORMAL HIGH (ref 0.61–1.24)
GFR, Estimated: 38 mL/min — ABNORMAL LOW (ref >60)
Glucose, Bld: 94 mg/dL (ref 70–99)
Potassium: 3.5 mmol/L (ref 3.5–5.1)
Sodium: 134 mmol/L — ABNORMAL LOW (ref 135–145)

## 2022-05-28 LAB — CBC WITH DIFFERENTIAL/PLATELET
Abs Immature Granulocytes: 0.01 10*3/uL (ref 0.00–0.07)
Basophils Absolute: 0 10*3/uL (ref 0.0–0.1)
Basophils Relative: 0 %
Eosinophils Absolute: 0.1 10*3/uL (ref 0.0–0.5)
Eosinophils Relative: 1 %
HCT: 26.4 % — ABNORMAL LOW (ref 39.0–52.0)
Hemoglobin: 8.4 g/dL — ABNORMAL LOW (ref 13.0–17.0)
Immature Granulocytes: 0 %
Lymphocytes Relative: 8 %
Lymphs Abs: 0.5 10*3/uL — ABNORMAL LOW (ref 0.7–4.0)
MCH: 30.8 pg (ref 26.0–34.0)
MCHC: 31.8 g/dL (ref 30.0–36.0)
MCV: 96.7 fL (ref 80.0–100.0)
Monocytes Absolute: 1 10*3/uL (ref 0.1–1.0)
Monocytes Relative: 17 %
Neutro Abs: 4.4 10*3/uL (ref 1.7–7.7)
Neutrophils Relative %: 74 %
Platelets: 180 10*3/uL (ref 150–400)
RBC: 2.73 MIL/uL — ABNORMAL LOW (ref 4.22–5.81)
RDW: 16.9 % — ABNORMAL HIGH (ref 11.5–15.5)
WBC: 6 10*3/uL (ref 4.0–10.5)
nRBC: 0 % (ref 0.0–0.2)

## 2022-05-28 LAB — COMPREHENSIVE METABOLIC PANEL
ALT: 24 U/L (ref 0–44)
AST: 36 U/L (ref 15–41)
Albumin: 3.2 g/dL — ABNORMAL LOW (ref 3.5–5.0)
Alkaline Phosphatase: 154 U/L — ABNORMAL HIGH (ref 38–126)
Anion gap: 10 (ref 5–15)
BUN: 30 mg/dL — ABNORMAL HIGH (ref 8–23)
CO2: 19 mmol/L — ABNORMAL LOW (ref 22–32)
Calcium: 8.3 mg/dL — ABNORMAL LOW (ref 8.9–10.3)
Chloride: 103 mmol/L (ref 98–111)
Creatinine, Ser: 2.21 mg/dL — ABNORMAL HIGH (ref 0.61–1.24)
GFR, Estimated: 29 mL/min — ABNORMAL LOW (ref >60)
Glucose, Bld: 140 mg/dL — ABNORMAL HIGH (ref 70–99)
Potassium: 2.5 mmol/L — CL (ref 3.5–5.1)
Sodium: 132 mmol/L — ABNORMAL LOW (ref 135–145)
Total Bilirubin: 0.9 mg/dL (ref 0.3–1.2)
Total Protein: 6.2 g/dL — ABNORMAL LOW (ref 6.5–8.1)

## 2022-05-28 LAB — LACTIC ACID, PLASMA
Lactic Acid, Venous: 2.3 mmol/L (ref 0.5–1.9)
Lactic Acid, Venous: 2.1 mmol/L (ref 0.5–1.9)

## 2022-05-28 LAB — CBC
HCT: 26.8 % — ABNORMAL LOW (ref 39.0–52.0)
Hemoglobin: 8.7 g/dL — ABNORMAL LOW (ref 13.0–17.0)
MCH: 31.2 pg (ref 26.0–34.0)
MCHC: 32.5 g/dL (ref 30.0–36.0)
MCV: 96.1 fL (ref 80.0–100.0)
Platelets: 178 10*3/uL (ref 150–400)
RBC: 2.79 MIL/uL — ABNORMAL LOW (ref 4.22–5.81)
RDW: 16.9 % — ABNORMAL HIGH (ref 11.5–15.5)
WBC: 6.6 10*3/uL (ref 4.0–10.5)
nRBC: 0 % (ref 0.0–0.2)

## 2022-05-28 LAB — MAGNESIUM: Magnesium: 1.6 mg/dL — ABNORMAL LOW (ref 1.7–2.4)

## 2022-05-28 LAB — C DIFFICILE QUICK SCREEN W PCR REFLEX
C Diff antigen: POSITIVE — AB
C Diff toxin: NEGATIVE

## 2022-05-28 MED ORDER — ONDANSETRON HCL 4 MG/2ML IJ SOLN
4.0000 mg | Freq: Four times a day (QID) | INTRAMUSCULAR | Status: DC | PRN
  Administered 2022-05-28: 4 mg via INTRAVENOUS
  Filled 2022-05-28: qty 2

## 2022-05-28 MED ORDER — POTASSIUM CHLORIDE 10 MEQ/100ML IV SOLN
10.0000 meq | INTRAVENOUS | Status: AC
  Administered 2022-05-28 (×2): 10 meq via INTRAVENOUS
  Filled 2022-05-28 (×2): qty 100

## 2022-05-28 MED ORDER — LACTATED RINGERS IV BOLUS
1000.0000 mL | Freq: Once | INTRAVENOUS | Status: AC
  Administered 2022-05-28: 1000 mL via INTRAVENOUS

## 2022-05-28 MED ORDER — POTASSIUM CHLORIDE CRYS ER 20 MEQ PO TBCR
40.0000 meq | EXTENDED_RELEASE_TABLET | Freq: Once | ORAL | Status: AC
  Administered 2022-05-28: 40 meq via ORAL
  Filled 2022-05-28: qty 2

## 2022-05-28 MED ORDER — ASPIRIN 81 MG PO TBEC
81.0000 mg | DELAYED_RELEASE_TABLET | Freq: Every day | ORAL | Status: DC
  Administered 2022-05-28 – 2022-06-01 (×5): 81 mg via ORAL
  Filled 2022-05-28 (×5): qty 1

## 2022-05-28 MED ORDER — OXYCODONE HCL 5 MG PO TABS: 5.0000 mg | ORAL_TABLET | ORAL | Status: DC | PRN

## 2022-05-28 MED ORDER — ACYCLOVIR 400 MG PO TABS
400.0000 mg | ORAL_TABLET | Freq: Two times a day (BID) | ORAL | Status: DC
  Administered 2022-05-28 – 2022-06-01 (×8): 400 mg via ORAL
  Filled 2022-05-28 (×8): qty 1

## 2022-05-28 MED ORDER — LACTATED RINGERS IV SOLN: INTRAVENOUS | Status: DC

## 2022-05-28 MED ORDER — MAGNESIUM OXIDE -MG SUPPLEMENT 400 (240 MG) MG PO TABS
800.0000 mg | ORAL_TABLET | Freq: Once | ORAL | Status: AC
  Administered 2022-05-28: 800 mg via ORAL
  Filled 2022-05-28: qty 2

## 2022-05-28 MED ORDER — TRAZODONE HCL 50 MG PO TABS: 25.0000 mg | ORAL_TABLET | Freq: Every evening | ORAL | Status: DC | PRN

## 2022-05-28 MED ORDER — ASPIRIN 81 MG PO TBEC: 81.0000 mg | DELAYED_RELEASE_TABLET | Freq: Two times a day (BID) | ORAL | Status: DC

## 2022-05-28 MED ORDER — ONDANSETRON HCL 4 MG PO TABS: 4.0000 mg | ORAL_TABLET | Freq: Four times a day (QID) | ORAL | Status: DC | PRN

## 2022-05-28 MED ORDER — HEPARIN SODIUM (PORCINE) 5000 UNIT/ML IJ SOLN
5000.0000 [IU] | Freq: Three times a day (TID) | INTRAMUSCULAR | Status: DC
  Administered 2022-05-28 – 2022-06-01 (×10): 5000 [IU] via SUBCUTANEOUS
  Filled 2022-05-28 (×11): qty 1

## 2022-05-28 MED ORDER — ACETAMINOPHEN 325 MG PO TABS: 650.0000 mg | ORAL_TABLET | Freq: Four times a day (QID) | ORAL | Status: DC | PRN

## 2022-05-28 MED ORDER — ACETAMINOPHEN 650 MG RE SUPP: 650.0000 mg | Freq: Four times a day (QID) | RECTAL | Status: DC | PRN

## 2022-05-28 NOTE — ED Provider Notes (Signed)
[place] EMERGENCY DEPARTMENT AT [facility] Provider Note  CSN: [id number] Arrival date & time: 05/28/22 [id number]  Chief Complaint(s) Hypotension  HPI Frank Hernandez is a 82 y.o. 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  History obtained from patient's wife who states that ever since patient left rehab after his IM nail for his pathologic femur fracture, patient has had difficulty tolerating p.o. and has been deteriorating from a functional standpoint.  Since starting his treatment for multiple myeloma he has had persistent black diarrhea.  He is on iron and takes this medication twice daily.  Patient was seen in oncology clinic today for another treatment and was found to be significantly hypokalemic with a new AKI and the patient was presyncopal.  Patient then sent to the emergency department for further evaluation.  Here in the emergency room, patient appears fatigued with dry tacky mucous membranes and conjunctival pallor.  He denies chest pain, shortness of breath, abdominal pain, nausea, vomiting or other systemic symptoms.   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[place]) 04/15/2022   Closed displaced subtrochanteric fracture of right femur ([place]) 04/13/2022   Normocytic anemia 04/13/2022   Chronic kidney disease, stage 3a ([place])  04/03/2022   Decreased oral intake 04/02/2022   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   Essential hypertension 04/01/2022   Pain in joint, lower leg 04/01/2022   Effusion of right knee 03/30/2022   Pressure injury of skin 03/30/2022   Effusion, right knee 03/30/2022   Concussion with unknown loss of consciousness status 12/24/2020   Hypokalemia    Rib fractures 06/10/2020   Severe essential hypertension 11/15/2018   Syncope 12/26/2015   Pre-diabetes 12/26/2015   Home Medication(s) Prior to Admission medications   Medication Sig Start Date End Date Taking? Authorizing Provider  acyclovir (ZOVIRAX) 400 MG tablet Take 1 tablet (400 mg total) by mouth 2 (two) times daily. 05/24/22   Brunetta Genera, MD  amLODipine (NORVASC) 10 MG tablet Take 10 mg by mouth daily.    [provider]  aspirin EC 81 MG tablet Take 1 tablet (81 mg total) by mouth 2 (two) times daily. Swallow whole. 04/19/22 05/31/22  Callie Fielding, MD  atorvastatin (LIPITOR) 40 MG tablet Take 40 mg by mouth every evening.    [provider]  DOXAZOSIN MESYLATE PO Take 2 mg by mouth every evening.    [provider]  feeding supplement (ENSURE ENLIVE / ENSURE PLUS) LIQD Take 237 mLs by mouth 2 (two) times daily between meals. Patient taking differently: Take 237 mLs by mouth daily. May use Boost 04/03/22   Mercy Riding, MD  ferrous sulfate 325 (65 FE) MG EC tablet Take 1 tablet (325 mg total) by mouth 2 (two) times daily. 04/03/22 09/30/22  Mercy Riding, MD  Multiple Vitamins-Iron (MULTIVITAMINS WITH IRON) TABS tablet Take 1 tablet by  mouth daily. 04/04/22   Mercy Riding, MD  ondansetron (ZOFRAN) 8 MG tablet Take 8 mg by mouth 30 to 60 min prior to Cyclophosphamide administration then take 8 mg every 8 hrs as needed for nausea and vomiting. 05/24/22   Brunetta Genera, MD  oxyCODONE (ROXICODONE) 5 MG immediate release tablet Take 1-2 tablets (5-10 mg total) by mouth every 4 (four) hours as needed for  severe pain or moderate pain. 04/19/22   Callie Fielding, MD  polyethylene glycol (MIRALAX) 17 g packet Take 17 g by mouth daily. 04/19/22   Callie Fielding, MD  prochlorperazine (COMPAZINE) 10 MG tablet Take 1 tablet (10 mg total) by mouth every 6 (six) hours as needed for nausea or vomiting. 05/24/22   Brunetta Genera, MD  Vitamin D, Ergocalciferol, (DRISDOL) 1.25 MG (50000 UNIT) CAPS capsule Take 1 capsule (50,000 Units total) by mouth once a week. 04/26/22   Shelly Coss, MD                                                                                                                                    Past Surgical History Past Surgical History:  Procedure Laterality Date   COLONOSCOPY     INTRAMEDULLARY (IM) NAIL INTERTROCHANTERIC Left 06/11/2020   Procedure: INTRAMEDULLARY (IM) NAIL INTERTROCHANTRIC;  Surgeon: Shona Needles, MD;  Location: [place];  Service: Orthopedics;  Laterality: Left;   INTRAMEDULLARY (IM) NAIL INTERTROCHANTERIC Right 04/17/2022   Procedure: INTRAMEDULLARY (IM) NAIL INTERTROCHANTERIC;  Surgeon: Callie Fielding, MD;  Location: [facility];  Service: Orthopedics;  Laterality: Right;   KNEE ARTHROSCOPY Bilateral    POLYPECTOMY     Family History Family History  Problem Relation Age of Onset   Colon cancer Neg Hx     Social History Social History   Tobacco Use   Smoking status: Never   Smokeless tobacco: Never  Vaping Use   Vaping Use: Never used  Substance Use Topics   Alcohol use: No    Alcohol/week: 0.0 standard drinks of alcohol   Drug use: No   Allergies Patient has no known allergies.  Review of Systems Review of Systems  Constitutional:  Positive for fatigue.  Gastrointestinal:  Positive for diarrhea.  Neurological:  Positive for light-headedness.    Physical Exam Vital Signs  I have reviewed the triage vital signs BP (!) 112/53   Pulse 75   Temp 97.7 F (36.5 C) (Oral)   Resp 13   Ht 5' 9.5" (1.765 m)   Wt 92.5 kg   SpO2 100%    BMI 29.69 kg/m   Physical Exam Constitutional:      General: He is not in acute distress.    Appearance: Normal appearance. He is ill-appearing.  HENT:     Head: Normocephalic and atraumatic.     Comments: Dry tacky mucous membranes    Nose: No congestion or rhinorrhea.  Eyes:  General:        Right eye: No discharge.        Left eye: No discharge.     Extraocular Movements: Extraocular movements intact.     Pupils: Pupils are equal, round, and reactive to light.  Cardiovascular:     Rate and Rhythm: Normal rate and regular rhythm.     Heart sounds: No murmur heard. Pulmonary:     Effort: No respiratory distress.     Breath sounds: No wheezing or rales.  Abdominal:     General: There is no distension.     Tenderness: There is no abdominal tenderness.  Musculoskeletal:        General: Normal range of motion.     Cervical back: Normal range of motion.  Skin:    General: Skin is warm and dry.     Coloration: Skin is pale.  Neurological:     General: No focal deficit present.     Mental Status: He is alert.     ED Results and Treatments Labs (all labs ordered are listed, but only abnormal results are displayed) Labs Reviewed  COMPREHENSIVE METABOLIC PANEL - Abnormal; Notable for the following components:      Result Value   Sodium 132 (*)    Potassium 2.5 (*)    CO2 19 (*)    Glucose, Bld 140 (*)    BUN 30 (*)    Creatinine, Ser 2.21 (*)    Calcium 8.3 (*)    Total Protein 6.2 (*)    Albumin 3.2 (*)    Alkaline Phosphatase 154 (*)    GFR, Estimated 29 (*)    All other components within normal limits  CBC WITH DIFFERENTIAL/PLATELET - Abnormal; Notable for the following components:   RBC 2.73 (*)    Hemoglobin 8.4 (*)    HCT 26.4 (*)    RDW 16.9 (*)    Lymphs Abs 0.5 (*)    All other components within normal limits  GASTROINTESTINAL PANEL BY PCR, STOOL (REPLACES STOOL CULTURE)  C DIFFICILE QUICK SCREEN W PCR REFLEX    LACTIC ACID, PLASMA  LACTIC ACID,  PLASMA                                                                                                                          Radiology No results found.  Pertinent labs & imaging results that were available during my care of the patient were reviewed by me and considered in my medical decision making (see MDM for details).  Medications Ordered in ED Medications  potassium chloride 10 mEq in 100 mL IVPB (10 mEq Intravenous New Bag/Given 05/28/22 0939)  lactated ringers bolus 1,000 mL (has no administration in time range)  lactated ringers infusion (has no administration in time range)  lactated ringers bolus 1,000 mL (1,000 mLs Intravenous New Bag/Given 05/28/22 0932)  potassium chloride SA (KLOR-CON M) CR tablet 40 mEq (40 mEq Oral Given 05/28/22 0941)  magnesium oxide (MAG-OX)  tablet 800 mg (800 mg Oral Given 05/28/22 0941)                                                                                                                                     Procedures .Critical Care  Performed by: Teressa Lower, MD Authorized by: Teressa Lower, MD   Critical care provider statement:    Critical care time (minutes):  30   Critical care was necessary to treat or prevent imminent or life-threatening deterioration of the following conditions:  Dehydration   Critical care was time spent personally by me on the following activities:  Development of treatment plan with patient or surrogate, discussions with consultants, evaluation of patient's response to treatment, examination of patient, ordering and review of laboratory studies, ordering and review of radiographic studies, ordering and performing treatments and interventions, pulse oximetry, re-evaluation of patient's condition and review of old charts Ultrasound ED Peripheral IV (Provider)  Date/Time: 05/28/2022 9:39 AM  Performed by: Teressa Lower, MD Authorized by: Teressa Lower, MD   Procedure details:    Indications: multiple  failed IV attempts     Skin Prep: chlorhexidine gluconate     Location:  Right AC   Angiocath:  18 G   Bedside Ultrasound Guided: Yes     Images: not archived     Patient tolerated procedure without complications: Yes     Dressing applied: Yes      (including critical care time)  Medical Decision Making / ED Course   This patient presents to the ED for concern of diarrhea, fatigue, abnormal labs, this involves an extensive number of treatment options, and is a complaint that carries with it a high risk of complications and morbidity.  The differential diagnosis includes chemotherapy side effect, GI bleed, infectious diarrhea, dehydration, prerenal AKI, progression of myeloma and medical renal disease  MDM: Patient seen emergency room for evaluation of diarrhea and abnormal labs.  Physical exam reveals a fatigued appearing patient with skin pallor and dry tacky mucous membranes.  Laboratory evaluation with a hemoglobin of 8.4, MCV 96.7, mild hyponatremia 132, significant hypokalemia to 2.5, BUN 30, creatinine 2.21.  Aggressive fluid resuscitation and potassium repletion with oral magnesium in addition initiated.  Stool studies ordered.  Patient did have some brief hypotension in the emergency department but was appropriately fluid responsive.  Patient require hospital admission for suspected prerenal AKI in the setting of chemotherapy.  Difficult to tell if black stool is secondary to GI bleed or chemotherapy-induced diarrhea while on iron.    Additional history obtained: -Additional history obtained from wife -External records from outside source obtained and reviewed including: Chart review including previous notes, labs, imaging, consultation notes   Lab Tests: -I ordered, reviewed, and interpreted labs.   The pertinent results include:   Labs Reviewed  COMPREHENSIVE METABOLIC PANEL - Abnormal; Notable for the following components:      Result Value  Sodium 132 (*)    Potassium  2.5 (*)    CO2 19 (*)    Glucose, Bld 140 (*)    BUN 30 (*)    Creatinine, Ser 2.21 (*)    Calcium 8.3 (*)    Total Protein 6.2 (*)    Albumin 3.2 (*)    Alkaline Phosphatase 154 (*)    GFR, Estimated 29 (*)    All other components within normal limits  CBC WITH DIFFERENTIAL/PLATELET - Abnormal; Notable for the following components:   RBC 2.73 (*)    Hemoglobin 8.4 (*)    HCT 26.4 (*)    RDW 16.9 (*)    Lymphs Abs 0.5 (*)    All other components within normal limits  GASTROINTESTINAL PANEL BY PCR, STOOL (REPLACES STOOL CULTURE)  C DIFFICILE QUICK SCREEN W PCR REFLEX    LACTIC ACID, PLASMA  LACTIC ACID, PLASMA      EKG   EKG Interpretation  Date/Time:  Friday May 28 2022 10:52:06 EDT Ventricular Rate:  91 PR Interval:  270 QRS Duration: 69 QT Interval:  423 QTC Calculation: 521 R Axis:   47 Text Interpretation: atiral fibrillation Prolonged QT interval Confirmed by Nycholas Rayner ([id number]) on 05/28/2022 7:02:23 PM         Medicines ordered and prescription drug management: Meds ordered this encounter  Medications   lactated ringers bolus 1,000 mL   potassium chloride SA (KLOR-CON M) CR tablet 40 mEq   magnesium oxide (MAG-OX) tablet 800 mg   potassium chloride 10 mEq in 100 mL IVPB   lactated ringers bolus 1,000 mL   lactated ringers infusion    -I have reviewed the patients home medicines and have made adjustments as needed  Critical interventions Fluid resuscitation, electrolyte repletion   Cardiac Monitoring: The patient was maintained on a cardiac monitor.  I personally viewed and interpreted the cardiac monitored which showed an underlying rhythm of: A-fib, rate controlled  Social Determinants of Health:  Factors impacting patients care include: none   Reevaluation: After the interventions noted above, I reevaluated the patient and found that they have :improved  Co morbidities that complicate the patient evaluation  Past Medical History:   Diagnosis Date   Bleeding from the nose    Hypertension    Pre-diabetes       Dispostion: I considered admission for this patient, and due to critical hypokalemia, new likely prerenal AKI, patient require hospital admission     Final Clinical Impression(s) / ED Diagnoses Final diagnoses:  None     @PCDICTATION @    Teressa Lower, MD 05/28/22 [phone]

## 2022-05-28 NOTE — ED Notes (Signed)
ED TO INPATIENT HANDOFF REPORT  ED Nurse Name and Phone #: [phone]  S Name/Age/Gender Frank Hernandez 82 y.o. male Room/Bed: WA13/WA13  Code Status   Code Status: Full Code  Home/SNF/Other Home Patient oriented to: self, place, time, and situation Is this baseline? Yes has intermittent periods of confusion about date and year  Triage Complete: Triage complete  Chief Complaint Hypokalemia [E87.6]  Triage Note Coming from cancer center for hypotension and mild altered mental status.  Wife reports he has surgery in feb and since stool has been extremely dark and diarrhea.  Patient states "it comes when it wants too".  Patient is alert and oriented x 4 at this time. Patient appears pale and conjunctiva is pale   Allergies No Known Allergies  Level of Care/Admitting Diagnosis ED Disposition     ED Disposition  Admit   Condition  --   Comment  Hospital Area: [place] [[id number]]  Level of Care: Telemetry [5]  Admit to tele based on following criteria: Monitor QTC interval  May place patient in observation at [facility] or [facility] if equivalent level of care is available:: Yes  Covid Evaluation: Asymptomatic - no recent exposure (last 10 days) testing not required  Diagnosis: Hypokalemia [172180]  Admitting Physician: Lucillie Garfinkel [id number]  Attending Physician: Hollice Gong, MIR M [[id number]]          B Medical/Surgery History Past Medical History:  Diagnosis Date   Bleeding from the nose    Hypertension    Pre-diabetes    Past Surgical History:  Procedure Laterality Date   COLONOSCOPY     INTRAMEDULLARY (IM) NAIL INTERTROCHANTERIC Left 06/11/2020   Procedure: INTRAMEDULLARY (IM) NAIL INTERTROCHANTRIC;  Surgeon: Shona Needles, MD;  Location: [place];  Service: Orthopedics;  Laterality: Left;   INTRAMEDULLARY (IM) NAIL INTERTROCHANTERIC Right 04/17/2022   Procedure: INTRAMEDULLARY (IM) NAIL INTERTROCHANTERIC;  Surgeon: Callie Fielding, MD;  Location: [facility];  Service: Orthopedics;  Laterality: Right;   KNEE ARTHROSCOPY Bilateral    POLYPECTOMY       A IV Location/Drains/Wounds Patient Lines/Drains/Airways Status     Active Line/Drains/Airways     Name Placement date Placement time Site Days   Peripheral IV 05/28/22 18 G Left Antecubital 05/28/22  [id number]  Antecubital  less than 1   Pressure Injury 03/29/22 Buttocks Right Stage 1 -  Intact skin with non-blanchable redness of a localized area usually over a bony prominence. 03/29/22  2140  -- 60            Intake/Output Last 24 hours  Intake/Output Summary (Last 24 hours) at 05/28/2022 1115 Last data filed at 05/28/2022 1051 Gross per 24 hour  Intake 100 ml  Output --  Net 100 ml    Labs/Imaging Results for orders placed or performed during the hospital encounter of 05/28/22 (from the past 48 hour(s))  Comprehensive metabolic panel     Status: Abnormal   Collection Time: 05/28/22  9:40 AM  Result Value Ref Range   Sodium 132 (L) 135 - 145 mmol/L   Potassium 2.5 (LL) 3.5 - 5.1 mmol/L    Comment: CRITICAL RESULT CALLED TO, READ BACK BY AND VERIFIED WITH Ardell Makarewicz,M RN @1031  05/28/22 CHILDRESS,E    Chloride 103 98 - 111 mmol/L   CO2 19 (L) 22 - 32 mmol/L   Glucose, Bld 140 (H) 70 - 99 mg/dL    Comment: Glucose reference range applies only to samples taken after fasting for at least 8  hours.   BUN 30 (H) 8 - 23 mg/dL   Creatinine, Ser 2.21 (H) 0.61 - 1.24 mg/dL   Calcium 8.3 (L) 8.9 - 10.3 mg/dL   Total Protein 6.2 (L) 6.5 - 8.1 g/dL   Albumin 3.2 (L) 3.5 - 5.0 g/dL   AST 36 15 - 41 U/L   ALT 24 0 - 44 U/L   Alkaline Phosphatase 154 (H) 38 - 126 U/L   Total Bilirubin 0.9 0.3 - 1.2 mg/dL   GFR, Estimated 29 (L) >60 mL/min    Comment: (NOTE) Calculated using the CKD-EPI Creatinine Equation (2021)    Anion gap 10 5 - 15    Comment: Performed at [facility], [place] [address], [place], [place] [address]  CBC with Differential      Status: Abnormal   Collection Time: 05/28/22  9:40 AM  Result Value Ref Range   WBC 6.0 4.0 - 10.5 K/uL   RBC 2.73 (L) 4.22 - 5.81 MIL/uL   Hemoglobin 8.4 (L) 13.0 - 17.0 g/dL   HCT 26.4 (L) 39.0 - 52.0 %   MCV 96.7 80.0 - 100.0 fL   MCH 30.8 26.0 - 34.0 pg   MCHC 31.8 30.0 - 36.0 g/dL   RDW 16.9 (H) 11.5 - 15.5 %   Platelets 180 150 - 400 K/uL   nRBC 0.0 0.0 - 0.2 %   Neutrophils Relative % 74 %   Neutro Abs 4.4 1.7 - 7.7 K/uL   Lymphocytes Relative 8 %   Lymphs Abs 0.5 (L) 0.7 - 4.0 K/uL   Monocytes Relative 17 %   Monocytes Absolute 1.0 0.1 - 1.0 K/uL   Eosinophils Relative 1 %   Eosinophils Absolute 0.1 0.0 - 0.5 K/uL   Basophils Relative 0 %   Basophils Absolute 0.0 0.0 - 0.1 K/uL   Immature Granulocytes 0 %   Abs Immature Granulocytes 0.01 0.00 - 0.07 K/uL    Comment: Performed at [facility], [place] [address] [place], [place] [address]  Lactic acid, plasma     Status: Abnormal   Collection Time: 05/28/22  9:40 AM  Result Value Ref Range   Lactic Acid, Venous 2.3 (HH) 0.5 - 1.9 mmol/L    Comment: CRITICAL RESULT CALLED TO, READ BACK BY AND VERIFIED WITH Elois Averitt,M RN @ 1049 05/28/22 CHILDRESS,E Performed at [facility], [place] [address], [place],  [address]    No results found.  Pending Labs Unresulted Labs (From admission, onward)     Start     Ordered   05/29/22 [id number]  Basic metabolic panel  Tomorrow morning,   R        05/28/22 1110   05/29/22 0500  CBC  Tomorrow morning,   R        05/28/22 1110   05/28/22 [id number]  Basic metabolic panel  Once,   R        05/28/22 1110   05/28/22 1108  CBC  (heparin)  Once,   R       Comments: Baseline for heparin therapy IF NOT ALREADY DRAWN.  Notify MD if PLT < 100 K.    05/28/22 1110   05/28/22 1108  Creatinine, serum  (heparin)  Once,   R       Comments: Baseline for heparin therapy IF NOT ALREADY DRAWN.    05/28/22 1110   05/28/22 1107  Magnesium  Add-on,   AD        05/28/22  1106  05/28/22 0950  Gastrointestinal Panel by PCR , Stool  (Gastrointestinal Panel by PCR, Stool                                                                                                                                                     **Does Not include CLOSTRIDIUM DIFFICILE testing. **If CDIFF testing is needed, place order from the "C Difficile Testing" order set.**)  Once,   URGENT        05/28/22 0949   05/28/22 0950  C Difficile Quick Screen w PCR reflex  (C Difficile quick screen w PCR reflex panel )  Once, for 24 hours,   URGENT       References:    CDiff Information Tool   05/28/22 0949   05/28/22 0916  Lactic acid, plasma  Now then every 2 hours,   R (with STAT occurrences)      05/28/22 0915            Vitals/Pain Today's Vitals   05/28/22 0900 05/28/22 1015 05/28/22 1045 05/28/22 1055  BP: (!) 83/49 (!) 112/53 120/62 120/60  Pulse: 80 75 69 97  Resp: 17 13 16 20   Temp:      TempSrc:      SpO2: 97% 100% 100% 100%  Weight:      Height:      PainSc:        Isolation Precautions Enteric precautions (UV disinfection)  Medications Medications  potassium chloride 10 mEq in 100 mL IVPB (10 mEq Intravenous New Bag/Given 05/28/22 1054)  lactated ringers infusion (has no administration in time range)  aspirin EC tablet 81 mg (has no administration in time range)  oxyCODONE (Oxy IR/ROXICODONE) immediate release tablet 5-10 mg (has no administration in time range)  acyclovir (ZOVIRAX) tablet 400 mg (has no administration in time range)  heparin injection 5,000 Units (has no administration in time range)  acetaminophen (TYLENOL) tablet 650 mg (has no administration in time range)    Or  acetaminophen (TYLENOL) suppository 650 mg (has no administration in time range)  traZODone (DESYREL) tablet 25 mg (has no administration in time range)  ondansetron (ZOFRAN) tablet 4 mg (has no administration in time range)    Or  ondansetron (ZOFRAN) injection 4 mg (has no  administration in time range)  lactated ringers bolus 1,000 mL (1,000 mLs Intravenous New Bag/Given 05/28/22 0932)  potassium chloride SA (KLOR-CON M) CR tablet 40 mEq (40 mEq Oral Given 05/28/22 0941)  magnesium oxide (MAG-OX) tablet 800 mg (800 mg Oral Given 05/28/22 0941)  lactated ringers bolus 1,000 mL (1,000 mLs Intravenous New Bag/Given 05/28/22 1051)    Mobility non-ambulatory     Focused Assessments Cardiac Assessment Handoff:  Cardiac Rhythm: Atrial fibrillation No results found for: "CKTOTAL", "CKMB", "CKMBINDEX", "TROPONINI" No results found for: "DDIMER" Does the Patient currently have  chest pain? No   , Renal Assessment Handoff:  AKI  R Recommendations: See Admitting Provider Note  Report given to:   Additional Notes: [id number]  Hard stick, IV infiltrated in right upper arm which was [place] IV.  Left AC 18g

## 2022-05-28 NOTE — ED Notes (Signed)
Attempted to straight stick patient. Unsuccessful. Dr. Matilde Sprang notified.

## 2022-05-28 NOTE — Progress Notes (Signed)
Bilat feet x-dry, scaling of skin

## 2022-05-28 NOTE — ED Triage Notes (Signed)
Coming from cancer center for hypotension and mild altered mental status.  Wife reports he has surgery in feb and since stool has been extremely dark and diarrhea.  Patient states "it comes when it wants too".  Patient is alert and oriented x 4 at this time. Patient appears pale and conjunctiva is pale

## 2022-05-28 NOTE — ED Notes (Signed)
I have attempted to secure chat and call floor to get in touch with nurse and no one is answering or responding. Agricultural consultant

## 2022-05-28 NOTE — ED Notes (Signed)
Unable to obtain additional labs.  Called 4W and notified patient will be coming upstairs as it has been over 40 mins since bed assigned.  Spoke to Ron who said he would relay the message.

## 2022-05-28 NOTE — ED Notes (Signed)
Pt full bed change including linen. Barrier cream applied to pt bottom, because pt complained of tenderness. RN notified. Pt resting comfortably in bed now. Pt has no needs at this time. Cam, RN @bedside .

## 2022-05-28 NOTE — ED Notes (Signed)
Pharmacy called by MD due to K running in IV when it infiltrated.  Awaiting further instructions.

## 2022-05-28 NOTE — H&P (Signed)
History and Physical  FRASIER SPRINGBORN [id number] DOB: 11-Aug-1940 DOA: 05/28/2022  PCP: [name] (Inactive)   Chief Complaint: diarrhea, hypotension  HPI: Frank Hernandez is a 82 y.o. male with medical history significant for remote history of bladder cancer who unfortunately had pathologic fracture of the right hip, was diagnosed with multiple myeloma, he also has a history of hypertension, CKD.  He recently started chemotherapy about 3 weeks ago, since that was started, his wife states he has been having copious watery diarrhea.  I spoke to her over the phone.  Patient was recently discharged from rehab to home, she states that he is still very weak, cannot ambulate, and states he has been home he has continued to have copious black looking watery diarrhea.  Patient and wife deny abdominal pain, nausea, vomiting, blood in the stool (though it is dark).  No fevers.  ED Course:   Patient presented to the emergency department with the above complaints, he was noted to be somewhat hypotensive blood pressure 83/49, was fluid responsive but blood pressure is now normal 114/75.  Lab work was also performed, he was noted to have normal white blood cell count, hemoglobin 8.9, platelets 194 potassium was low at 2.6, on recheck confirmed 2.5.  He has lactic acidosis 2.3, creatinine has doubled to 2.24 from his normal baseline of about 1.2.  Patient was started on IV fluids, after which his blood pressure got better.  Stool studies have been ordered.  Patient was also given IV potassium, and hospitalist was contacted for admission.  Currently the patient is on the floor, he is eating lunch, has no complaints has not had any diarrhea since he has been up to the floor.  Review of Systems: Please see HPI for pertinent positives and negatives. A complete 10 system review of systems are otherwise negative.  Past Medical History:  Diagnosis Date   Bleeding from the nose    Hypertension     Pre-diabetes    Past Surgical History:  Procedure Laterality Date   COLONOSCOPY     INTRAMEDULLARY (IM) NAIL INTERTROCHANTERIC Left 06/11/2020   Procedure: INTRAMEDULLARY (IM) NAIL INTERTROCHANTRIC;  Surgeon: Shona Needles, MD;  Location: [facility];  Service: Orthopedics;  Laterality: Left;   INTRAMEDULLARY (IM) NAIL INTERTROCHANTERIC Right 04/17/2022   Procedure: INTRAMEDULLARY (IM) NAIL INTERTROCHANTERIC;  Surgeon: Callie Fielding, MD;  Location: [facility];  Service: Orthopedics;  Laterality: Right;   KNEE ARTHROSCOPY Bilateral    POLYPECTOMY      Social History:  reports that he has never smoked. He has never used smokeless tobacco. He reports that he does not drink alcohol and does not use drugs.   No Known Allergies  Family History  Problem Relation Age of Onset   Colon cancer Neg Hx      Prior to Admission medications   Medication Sig Start Date End Date Taking? Authorizing Provider  acyclovir (ZOVIRAX) 400 MG tablet Take 1 tablet (400 mg total) by mouth 2 (two) times daily. 05/24/22   Brunetta Genera, MD  amLODipine (NORVASC) 10 MG tablet Take 10 mg by mouth daily.    [provider]  aspirin EC 81 MG tablet Take 1 tablet (81 mg total) by mouth 2 (two) times daily. Swallow whole. 04/19/22 05/31/22  Callie Fielding, MD  atorvastatin (LIPITOR) 40 MG tablet Take 40 mg by mouth every evening.    [provider]  DOXAZOSIN MESYLATE PO Take 2 mg by mouth every evening.  [provider]  feeding supplement (ENSURE ENLIVE / ENSURE PLUS) LIQD Take 237 mLs by mouth 2 (two) times daily between meals. Patient taking differently: Take 237 mLs by mouth daily. May use Boost 04/03/22   Mercy Riding, MD  ferrous sulfate 325 (65 FE) MG EC tablet Take 1 tablet (325 mg total) by mouth 2 (two) times daily. 04/03/22 09/30/22  Mercy Riding, MD  Multiple Vitamins-Iron (MULTIVITAMINS WITH IRON) TABS tablet Take 1 tablet by mouth daily. 04/04/22   Mercy Riding, MD  ondansetron  (ZOFRAN) 8 MG tablet Take 8 mg by mouth 30 to 60 min prior to Cyclophosphamide administration then take 8 mg every 8 hrs as needed for nausea and vomiting. 05/24/22   Brunetta Genera, MD  oxyCODONE (ROXICODONE) 5 MG immediate release tablet Take 1-2 tablets (5-10 mg total) by mouth every 4 (four) hours as needed for severe pain or moderate pain. 04/19/22   Callie Fielding, MD  polyethylene glycol (MIRALAX) 17 g packet Take 17 g by mouth daily. 04/19/22   Callie Fielding, MD  prochlorperazine (COMPAZINE) 10 MG tablet Take 1 tablet (10 mg total) by mouth every 6 (six) hours as needed for nausea or vomiting. 05/24/22   Brunetta Genera, MD  Vitamin D, Ergocalciferol, (DRISDOL) 1.25 MG (50000 UNIT) CAPS capsule Take 1 capsule (50,000 Units total) by mouth once a week. 04/26/22   Shelly Coss, MD    Physical Exam: BP 120/60   Pulse 97   Temp 97.7 F (36.5 C) (Oral)   Resp 20   Ht 5' 9.5" (1.765 m)   Wt 92.5 kg   SpO2 100%   BMI 29.69 kg/m   General:  Alert, oriented, calm, in no acute distress, pleasant, nontoxic in appearance and looks his stated age.  Eyes: EOMI, clear conjuctivae, white sclerea Neck: supple, no masses, trachea mildline  Cardiovascular: RRR, no murmurs or rubs, no peripheral edema  Respiratory: clear to auscultation bilaterally, no wheezes, no crackles  Abdomen: soft, nontender, nondistended, normal bowel tones heard  Skin: dry, no rashes  Musculoskeletal: no joint effusions, normal range of motion  Psychiatric: appropriate affect, normal speech  Neurologic: extraocular muscles intact, clear speech, moving all extremities with intact sensorium          Labs on Admission:  Basic Metabolic Panel: Recent Labs  Lab 05/21/22 1313 05/28/22 0720 05/28/22 0940  NA 135 135 132*  K 4.2 2.6* 2.5*  CL 105 104 103  CO2 23 19* 19*  GLUCOSE 95 97 140*  BUN 20 29* 30*  CREATININE 1.17 2.24* 2.21*  CALCIUM 8.6* 8.5* 8.3*   Liver Function Tests: Recent Labs  Lab  05/21/22 1313 05/28/22 0720 05/28/22 0940  AST 45* 33 36  ALT 25 23 24   ALKPHOS 187* 171* 154*  BILITOT 0.9 0.9 0.9  PROT 6.1* 6.3* 6.2*  ALBUMIN 3.4* 3.5 3.2*   No results for input(s): "LIPASE", "AMYLASE" in the last 168 hours. No results for input(s): "AMMONIA" in the last 168 hours. CBC: Recent Labs  Lab 05/21/22 1313 05/28/22 0720 05/28/22 0940  WBC 4.6 5.4 6.0  NEUTROABS 3.3 4.0 4.4  HGB 8.8* 8.9* 8.4*  HCT 26.0* 26.1* 26.4*  MCV 92.5 90.3 96.7  PLT 157 194 180   Cardiac Enzymes: No results for input(s): "CKTOTAL", "CKMB", "CKMBINDEX", "TROPONINI" in the last 168 hours.  BNP (last 3 results) No results for input(s): "BNP" in the last 8760 hours.  ProBNP (last 3 results) No results for  input(s): "PROBNP" in the last 8760 hours.  CBG: No results for input(s): "GLUCAP" in the last 168 hours.  Radiological Exams on Admission: No results found.  Assessment/Plan This is an unfortunate 82 year old gentleman with history of hypertension, CKD, remote history of bladder cancer with recent pathologic fracture of the hip found to have multiple myeloma currently undergoing chemotherapy and had IM nail of the right intertrochanteric hip fracture on 04/17/22.  Since that surgery, his wife states he has been having copious watery diarrhea, he now presents to the hospital with hypotension, hypokalemia and acute on chronic renal failure.   Hypokalemia -likely due to his copious diarrhea and resulting dehydration, etiology of which is unclear at the moment but may be chemotherapy effect. 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 Irene Limbo   Hypotension due to dehydration   Dehydration due to diarrhea   Lactic acidosis, likely due to dehydration, otherwise no evidence of sepsis  DVT prophylaxis: Lovenox     Code Status: Full Code, confirmed with wife over the phone  Consults called: None  Admission status: Observation  Time spent: 49 minutes  Shenna Brissette Neva Seat MD [facility] Pager [phone]  If 7PM-7AM, please contact night-coverage [email or web address] Password [email or web address]  05/28/2022, 11:11 AM

## 2022-05-28 NOTE — Progress Notes (Signed)
Wife stated on phone inability to properly care for pt. On admission pt stated he could not be out of bed at all. Now states that he stood up at home & got himself in wheel chair for the day. Obtaining mixed information.

## 2022-05-28 NOTE — Progress Notes (Signed)
Wt bearing unknown, until after PT assessment. Pt has answered differently to questions. On arrival stated he didn't get OOB, later told nurse he tries to get himself in wheel chair from the bed every morning. Wife stated on phone she is having difficulty caring for patient appropriately.

## 2022-05-29 ENCOUNTER — Other Ambulatory Visit: Payer: Self-pay

## 2022-05-29 DIAGNOSIS — Z5982 Transportation insecurity: Secondary | ICD-10-CM | POA: Diagnosis not present

## 2022-05-29 DIAGNOSIS — R7303 Prediabetes: Secondary | ICD-10-CM | POA: Diagnosis present

## 2022-05-29 DIAGNOSIS — E8581 Light chain (AL) amyloidosis: Secondary | ICD-10-CM | POA: Diagnosis present

## 2022-05-29 DIAGNOSIS — I10 Essential (primary) hypertension: Secondary | ICD-10-CM

## 2022-05-29 DIAGNOSIS — A0472 Enterocolitis due to Clostridium difficile, not specified as recurrent: Secondary | ICD-10-CM | POA: Diagnosis present

## 2022-05-29 DIAGNOSIS — I9589 Other hypotension: Secondary | ICD-10-CM

## 2022-05-29 DIAGNOSIS — I129 Hypertensive chronic kidney disease with stage 1 through stage 4 chronic kidney disease, or unspecified chronic kidney disease: Secondary | ICD-10-CM | POA: Diagnosis present

## 2022-05-29 DIAGNOSIS — R197 Diarrhea, unspecified: Secondary | ICD-10-CM

## 2022-05-29 DIAGNOSIS — R001 Bradycardia, unspecified: Secondary | ICD-10-CM | POA: Diagnosis not present

## 2022-05-29 DIAGNOSIS — E860 Dehydration: Secondary | ICD-10-CM | POA: Diagnosis present

## 2022-05-29 DIAGNOSIS — Z79899 Other long term (current) drug therapy: Secondary | ICD-10-CM | POA: Diagnosis not present

## 2022-05-29 DIAGNOSIS — I440 Atrioventricular block, first degree: Secondary | ICD-10-CM | POA: Diagnosis present

## 2022-05-29 DIAGNOSIS — E872 Acidosis, unspecified: Secondary | ICD-10-CM | POA: Diagnosis present

## 2022-05-29 DIAGNOSIS — N1831 Chronic kidney disease, stage 3a: Secondary | ICD-10-CM | POA: Diagnosis present

## 2022-05-29 DIAGNOSIS — Z8551 Personal history of malignant neoplasm of bladder: Secondary | ICD-10-CM | POA: Diagnosis not present

## 2022-05-29 DIAGNOSIS — I498 Other specified cardiac arrhythmias: Secondary | ICD-10-CM | POA: Diagnosis present

## 2022-05-29 DIAGNOSIS — N179 Acute kidney failure, unspecified: Secondary | ICD-10-CM

## 2022-05-29 DIAGNOSIS — D630 Anemia in neoplastic disease: Secondary | ICD-10-CM | POA: Diagnosis present

## 2022-05-29 DIAGNOSIS — I469 Cardiac arrest, cause unspecified: Secondary | ICD-10-CM | POA: Diagnosis not present

## 2022-05-29 DIAGNOSIS — L89613 Pressure ulcer of right heel, stage 3: Secondary | ICD-10-CM | POA: Diagnosis present

## 2022-05-29 DIAGNOSIS — I455 Other specified heart block: Secondary | ICD-10-CM | POA: Insufficient documentation

## 2022-05-29 DIAGNOSIS — I4891 Unspecified atrial fibrillation: Secondary | ICD-10-CM | POA: Diagnosis present

## 2022-05-29 DIAGNOSIS — C9000 Multiple myeloma not having achieved remission: Secondary | ICD-10-CM

## 2022-05-29 DIAGNOSIS — E871 Hypo-osmolality and hyponatremia: Secondary | ICD-10-CM | POA: Diagnosis present

## 2022-05-29 DIAGNOSIS — E861 Hypovolemia: Secondary | ICD-10-CM | POA: Diagnosis present

## 2022-05-29 DIAGNOSIS — Z5941 Food insecurity: Secondary | ICD-10-CM | POA: Diagnosis not present

## 2022-05-29 DIAGNOSIS — E876 Hypokalemia: Secondary | ICD-10-CM | POA: Diagnosis present

## 2022-05-29 LAB — GASTROINTESTINAL PANEL BY PCR, STOOL (REPLACES STOOL CULTURE)

## 2022-05-29 LAB — CBC
HCT: 23.7 % — ABNORMAL LOW (ref 39.0–52.0)
Hemoglobin: 7.8 g/dL — ABNORMAL LOW (ref 13.0–17.0)
MCH: 30.7 pg (ref 26.0–34.0)
MCHC: 32.9 g/dL (ref 30.0–36.0)
MCV: 93.3 fL (ref 80.0–100.0)
Platelets: 180 10*3/uL (ref 150–400)
RBC: 2.54 MIL/uL — ABNORMAL LOW (ref 4.22–5.81)
RDW: 16.8 % — ABNORMAL HIGH (ref 11.5–15.5)
WBC: 4.7 10*3/uL (ref 4.0–10.5)
nRBC: 0 % (ref 0.0–0.2)

## 2022-05-29 LAB — PHOSPHORUS: Phosphorus: 2.8 mg/dL (ref 2.5–4.6)

## 2022-05-29 LAB — BASIC METABOLIC PANEL
Anion gap: 6 (ref 5–15)
BUN: 25 mg/dL — ABNORMAL HIGH (ref 8–23)
CO2: 21 mmol/L — ABNORMAL LOW (ref 22–32)
Calcium: 8.1 mg/dL — ABNORMAL LOW (ref 8.9–10.3)
Chloride: 108 mmol/L (ref 98–111)
Creatinine, Ser: 1.48 mg/dL — ABNORMAL HIGH (ref 0.61–1.24)
GFR, Estimated: 47 mL/min — ABNORMAL LOW (ref >60)
Glucose, Bld: 79 mg/dL (ref 70–99)
Potassium: 3.1 mmol/L — ABNORMAL LOW (ref 3.5–5.1)
Sodium: 135 mmol/L (ref 135–145)

## 2022-05-29 LAB — SAMPLE TO BLOOD BANK

## 2022-05-29 LAB — MAGNESIUM: Magnesium: 1.6 mg/dL — ABNORMAL LOW (ref 1.7–2.4)

## 2022-05-29 LAB — CLOSTRIDIUM DIFFICILE BY PCR, REFLEXED: Toxigenic C. Difficile by PCR: NEGATIVE

## 2022-05-29 MED ORDER — POTASSIUM CHLORIDE CRYS ER 20 MEQ PO TBCR
40.0000 meq | EXTENDED_RELEASE_TABLET | Freq: Three times a day (TID) | ORAL | Status: AC
  Administered 2022-05-29 (×2): 40 meq via ORAL
  Filled 2022-05-29 (×2): qty 2

## 2022-05-29 MED ORDER — VANCOMYCIN HCL 125 MG PO CAPS
125.0000 mg | ORAL_CAPSULE | Freq: Four times a day (QID) | ORAL | Status: DC
  Administered 2022-05-29 (×3): 125 mg via ORAL
  Filled 2022-05-29 (×5): qty 1

## 2022-05-29 MED ORDER — MAGNESIUM SULFATE 4 GM/100ML IV SOLN
4.0000 g | Freq: Once | INTRAVENOUS | Status: AC
  Administered 2022-05-29: 4 g via INTRAVENOUS
  Filled 2022-05-29: qty 100

## 2022-05-29 MED ORDER — MEDIHONEY WOUND/BURN DRESSING EX PSTE
1.0000 | PASTE | Freq: Every day | CUTANEOUS | Status: DC
  Administered 2022-05-29 – 2022-06-01 (×4): 1 via TOPICAL
  Filled 2022-05-29: qty 44

## 2022-05-29 NOTE — Progress Notes (Signed)
PROGRESS NOTE    Frank Hernandez  [id number] DOB: [birth year] DOA: 05/28/2022 PCP: [name] (Inactive)   Chief Complaint  Patient presents with   Hypotension    Brief Narrative: Patient is a 82 year old gentleman history of remote bladder cancer, recent pathologic fracture of the right hip hospitalized diagnosed with multiple myeloma, CKD, hypertension recently started on treatment for multiple myeloma presented to the ED with copious watery diarrhea, significant weakness, noted to be hypotensive on presentation.  Patient placed on IV fluids, stool studies obtained concerning for C. difficile infection and patient started on oral vancomycin pending PCR results.  BP responded to IV fluids.   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 difficile colitis -Patient presented 1 week history of copious watery diarrhea since being discharged from SNF. -Stool studies sent with positive C. difficile antigen, negative C. difficile toxin, toxigenic C. difficile by PCR was negative. -Due to presentation of multiple episodes of watery loose stools, positive C. difficile antigen, elevated lactic acid level patient started on oral vancomycin. -IV fluids, supportive care.  2.  Severe electrolyte abnormalities/severe hypokalemia/hypomagnesemia -Secondary to GI losses -Potassium on admission noted as low as 2.5. -Potassium currently at 3.1 this morning, magnesium at 1.6. -Magnesium sulfate 4 g IV x 1. -K-Dur 40 mEq p.o. x 2 doses already given this morning. -Repeat labs in the AM.  3.  Acute on chronic kidney stage IIIa -Likely secondary to prerenal azotemia secondary to  diarrhea. -Patient also noted to be hypotensive on presentation. - baseline creatinine approximately 1.5-1.7. -Renal function improving with hydration. -Avoid nephrotoxic agents.  4.  Dehydration -IV fluids.  5.  Ventricular standstill/sinus pause -Patient noted to have a ventricular standstill/sinus pause around 5:20 AM, per RN note patient remained asymptomatic.  Unsure as to whether patient likely sleeping at that time. -Patient noted with severe electrolyte abnormalities of hypomagnesemia, hypokalemia which are being repleted. -Case discussed with cardiology, Dr. Curt Bears who felt if sinus pause/ventricular standstill okay during the sleep cycle nothing further to do at this time as patient not on any AV nodal blocking agents.  Per cardiology monitor and if pulse recurs while patient awake for patient symptomatic then will need a formal cardiology consult.   -Supportive care.  6.  Hypertension -Patient had presented with hypotension and as such antihypertensive medications on hold.  7.  Hypovolemic hypotension -Secondary to volume depletion secondary to GI losses from diarrhea in the setting of antihypertensive medications. -Antihypertensive medications on hold. -BP responded to IV fluids. -Follow.  8.  Recently diagnosed multiple myeloma -Outpatient follow-up with hematology/oncology, Dr.Kale. -Hematology/oncology notified of admission via epic.   DVT prophylaxis: SCDs Code Status: Full Family Communication: Updated patient.  No family at bedside. Disposition: Likely home with home health versus SNF  Status is: Inpatient The patient will require care spanning > 2 midnights and should be moved to inpatient because: Inpatient   Consultants:  None  Procedures:  None  Antimicrobials:  Oral vancomycin 05/29/2022>>>   Subjective: Patient sitting up in bed.  About to eat his breakfast.  States he feels better after eating.  Denies any chest pain or shortness of breath.  No  significant abdominal pain.  Stated had 2 loose watery stools this morning.  Overall feeling better than he did on admission.  Events earlier this morning noted patient had a ventricular standstill around 5:20 AM noted to be asymptomatic at that time.  Objective: Vitals:   05/28/22 2340 05/29/22 0449 05/29/22 0547 05/29/22 1153  BP: (!) 109/57 113/66 113/69 126/69  Pulse: 88 87  71  Resp: 20 20  16   Temp: 99.1 F (37.3 C) 98.9 F (37.2 C)  98.3 F (36.8 C)  TempSrc: Oral Oral  Oral  SpO2: 99% 99%  100%  Weight:      Height:        Intake/Output Summary (Last 24 hours) at 05/29/2022 1747 Last data filed at 05/29/2022 1310 Gross per 24 hour  Intake 2222.99 ml  Output 1700 ml  Net 522.99 ml   Filed Weights   05/28/22 0845  Weight: 92.5 kg    Examination:  General exam: Appears calm and comfortable  Respiratory system: Clear to auscultation.  No wheezes, no crackles, no rhonchi.  Fair air movement.  Speaking in full sentences.  Respiratory effort normal. Cardiovascular system: S1 & S2 heard, RRR. No JVD, murmurs, rubs, gallops or clicks. No pedal edema. Gastrointestinal system: Abdomen is nondistended, soft and nontender. No organomegaly or masses felt. Normal bowel sounds heard. Central nervous system: Alert and oriented. No focal neurological deficits. Extremities: Symmetric 5 x 5 power. Skin: No rashes, lesions or ulcers Psychiatry: Judgement and insight appear normal. Mood & affect appropriate.     Data Reviewed: I have personally reviewed following labs and imaging studies  CBC: Recent Labs  Lab 05/28/22 0720 05/28/22 0940 05/28/22 1446 05/29/22 0406  WBC 5.4 6.0 6.6 4.7  NEUTROABS 4.0 4.4  --   --   HGB 8.9* 8.4* 8.7* 7.8*  HCT 26.1* 26.4* 26.8* 23.7*  MCV 90.3 96.7 96.1 93.3  PLT 194 180 178 [id number]    Basic Metabolic Panel: Recent Labs  Lab 05/28/22 0720 05/28/22 0940 05/28/22 1446 05/28/22 1814 05/29/22 0406  NA 135 132*  --  134* 135  K 2.6* 2.5*   --  3.5 3.1*  CL 104 103  --  107 108  CO2 19* 19*  --  18* 21*  GLUCOSE 97 140*  --  94 79  BUN 29* 30*  --  27* 25*  CREATININE 2.24* 2.21* 1.88* 1.76* 1.48*  CALCIUM 8.5* 8.3*  --  8.3* 8.1*  MG  --   --  1.6*  --  1.6*  PHOS  --   --   --   --  2.8    GFR: Estimated Creatinine Clearance: 44.3 mL/min (A) (by C-G formula based on SCr of 1.48 mg/dL (H)).  Liver Function Tests: Recent Labs  Lab 05/28/22 0720 05/28/22 0940  AST 33 36  ALT 23 24  ALKPHOS 171* 154*  BILITOT 0.9 0.9  PROT 6.3* 6.2*  ALBUMIN 3.5 3.2*    CBG: No results for input(s): "GLUCAP" in the last 168 hours.   Recent Results (from the past 240 hour(s))  C. Diff by PCR, Reflexed     Status: None   Collection Time: 05/28/22 12:38 PM  Result Value Ref Range Status   Toxigenic C. Difficile by PCR NEGATIVE NEGATIVE Final    Comment: Patient is colonized with non toxigenic C. difficile. May not need treatment unless significant symptoms are present. Performed at [facility], [place] [address], [place], [place] [address]   Gastrointestinal  Panel by PCR , Stool     Status: None   Collection Time: 05/28/22  4:32 PM   Specimen: Stool  Result Value Ref Range Status   Campylobacter species NOT DETECTED NOT DETECTED Final   Plesimonas shigelloides NOT DETECTED NOT DETECTED Final   Salmonella species NOT DETECTED NOT DETECTED Final   Yersinia enterocolitica NOT DETECTED NOT DETECTED Final   Vibrio species NOT DETECTED NOT DETECTED Final   Vibrio cholerae NOT DETECTED NOT DETECTED Final   Enteroaggregative E coli (EAEC) NOT DETECTED NOT DETECTED Final   Enteropathogenic E coli (EPEC) NOT DETECTED NOT DETECTED Final   Enterotoxigenic E coli (ETEC) NOT DETECTED NOT DETECTED Final   Shiga like toxin producing E coli (STEC) NOT DETECTED NOT DETECTED Final   Shigella/Enteroinvasive E coli (EIEC) NOT DETECTED NOT DETECTED Final   Cryptosporidium NOT DETECTED NOT DETECTED Final   Cyclospora cayetanensis NOT  DETECTED NOT DETECTED Final   Entamoeba histolytica NOT DETECTED NOT DETECTED Final   Giardia lamblia NOT DETECTED NOT DETECTED Final   Adenovirus F40/41 NOT DETECTED NOT DETECTED Final   Astrovirus NOT DETECTED NOT DETECTED Final   Norovirus GI/GII NOT DETECTED NOT DETECTED Final   Rotavirus A NOT DETECTED NOT DETECTED Final   Sapovirus (I, II, IV, and V) NOT DETECTED NOT DETECTED Final    Comment: Performed at [facility], [address] [place], [place] [address]  C Difficile Quick Screen w PCR reflex     Status: Abnormal   Collection Time: 05/28/22  4:33 PM   Specimen: Stool  Result Value Ref Range Status   C Diff antigen POSITIVE (A) NEGATIVE Final   C Diff toxin NEGATIVE NEGATIVE Final   C Diff interpretation Results are indeterminate. See PCR results.  Final    Comment: Performed at [facility], [place] [address], [place], [place] [address]         Radiology Studies: No results found.      Scheduled Meds:  acyclovir  400 mg Oral BID   aspirin EC  81 mg Oral Daily   heparin  5,000 Units Subcutaneous Q8H   leptospermum manuka honey  1 Application Topical Daily   vancomycin  125 mg Oral QID   Continuous Infusions:  lactated ringers 75 mL/hr at 05/28/22 1803     LOS: 0 days    Time spent: 40 minutes    Irine Seal, MD [facility]   To contact the attending provider between 7A-7P or the covering provider during after hours 7P-7A, please log into the web site [email or web address] and access using universal [place] password for that web site. If you do not have the password, please call the hospital operator.  05/29/2022, 5:47 PM

## 2022-05-29 NOTE — Consult Note (Signed)
WOC Nurse Consult Note: Reason for Consult:Right heel stage 3 PI Wound type:Pressure Pressure Injury POA: Yes Measurement:To be obtained by Bedside RN with next dressing  Treatment/.Dressing/.Topical Care recommendation:  Cleanse with NS, pat dry. Cover with leptospermum Manuka honey (MediHoney) and top with dry gauze. Secure with a few turns of Kerlix roll gauze/paper tape. Change daily. Bilateral pressure redistribution heel boots are provided. Turning and repositioning is in place, but guidance is provided to minimize time in the supine position. A sacral foam is to be placed for PI prevention in that area. Timely incontinence care is to be provided using house incontinence skin care products.  [facility] [address] nursing team will not follow, but will remain available to this patient, the nursing and medical teams.  Please re-consult if needed.  Thank you for inviting [place] to participate in this patient's Plan of Care.  Maudie Flakes, MSN, RN, CNS, [place], Serita Grammes, [employer], [employer] phone:  [phone]

## 2022-05-29 NOTE — TOC Initial Note (Signed)
Transition of Care [facility]) - Initial/Assessment Note    Patient Details  Name: Frank Hernandez MRN: [id number] Date of Birth: 02-May-1940  Transition of Care [facility]) CM/SW Contact:    Rodney Booze, LCSW Phone Number: 05/29/2022, 10:43 AM  Clinical Narrative:                 CSW spoke to the wife of the patient. At this time the wife is unsure of the DC plan. The wife stated that she can not help him shower etc. The wife also reported that the husband just left [facility] a few days ago. The wife stated that if the patient's condition gets better than the husband can come home with home health. This CSW told the patient's wife that TOC will continue to follow up as the patient is closer to DC.   Expected Discharge Plan:  (Wife is not sure at this time, wants to see how his condition is at time of DC) Barriers to Discharge: No Barriers Identified   Patient Goals and CMS Choice Patient states their goals for this hospitalization and ongoing recovery are:: Wife is unsure at this time patient just left SNF a few days ago          Expected Discharge Plan and Services       Living arrangements for the past 2 months: Single Family Home                                      Prior Living Arrangements/Services Living arrangements for the past 2 months: Single Family Home Lives with:: Spouse Patient language and need for interpreter reviewed:: No Do you feel safe going back to the place where you live?: Yes      Need for Family Participation in Patient Care: No (Comment) Care giver support system in place?: Yes (comment)   Criminal Activity/Legal Involvement Pertinent to Current Situation/Hospitalization: No - Comment as needed  Activities of Daily Living Home Assistive Devices/Equipment: Eyeglasses, Wheelchair, Environmental consultant (specify type) ADL Screening (condition at time of admission) Patient's cognitive ability adequate to safely complete daily activities?: No Is the patient deaf or  have difficulty hearing?: No Does the patient have difficulty seeing, even when wearing glasses/contacts?: No Does the patient have difficulty concentrating, remembering, or making decisions?: No Patient able to express need for assistance with ADLs?: Yes Does the patient have difficulty dressing or bathing?: Yes Independently performs ADLs?: No Communication: Independent Dressing (OT): Dependent Is this a change from baseline?: Pre-admission baseline Grooming: Needs assistance Is this a change from baseline?: Pre-admission baseline Feeding: Needs assistance (Needs set up, feeds self) Is this a change from baseline?: Pre-admission baseline Toileting: Dependent Is this a change from baseline?: Pre-admission baseline In/Out Bed: Dependent Is this a change from baseline?: Pre-admission baseline Walks in Home: Dependent Is this a change from baseline?: Pre-admission baseline Does the patient have difficulty walking or climbing stairs?: Yes Weakness of Legs: Both Weakness of Arms/Hands: None  Permission Sought/Granted                  Emotional Assessment       Orientation: : Oriented to Self, Oriented to Place, Oriented to  Time, Oriented to Situation Alcohol / Substance Use: Not Applicable, Never Used Psych Involvement: No (comment)  Admission diagnosis:  Hypokalemia [E87.6] AKI (acute kidney injury) [N17.9] Patient Active Problem List   Diagnosis Date Noted   Sinus  pause 05/29/2022   AKI (acute kidney injury) ([place]) 05/28/2022   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[place]) 05/28/2022   Hypotension 05/28/2022   Dehydration 05/28/2022   Diarrhea 05/28/2022   Lactic acidosis 05/28/2022   Counseling regarding advance care planning and goals of care 04/22/2022   Multiple myeloma not having achieved remission ([place]) 04/21/2022   Vitamin D deficiency 04/19/2022   Malignant neoplasm of bone with metastases ([place]) 04/15/2022   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 ([place]) 04/15/2022   Closed displaced subtrochanteric fracture of right femur ([place]) 04/13/2022   Normocytic anemia 04/13/2022   Chronic kidney disease, stage 3a ([place]) 04/03/2022   Decreased oral intake 04/02/2022   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   Essential hypertension 04/01/2022   Pain in joint, lower leg 04/01/2022   Effusion of right knee 03/30/2022   Pressure injury of skin 03/30/2022   Effusion, right knee 03/30/2022   Concussion with unknown loss of consciousness status 12/24/2020   Hypokalemia    Rib fractures 06/10/2020   Severe essential hypertension 11/15/2018   Syncope 12/26/2015   Pre-diabetes 12/26/2015   PCP:  [name] (Inactive) Pharmacy:   [place] ([place]), [place] - [address] [place] ([place]) [place] [address] Phone: [phone] Fax: [phone]     Social Determinants of Health ([place]) Social History: SDOH Screenings   Food Insecurity: Food Insecurity Present (05/28/2022)  Housing: Low Risk  (05/28/2022)  Transportation Needs: Unmet Transportation Needs (05/28/2022)  Utilities: Not At Risk (05/28/2022)  Depression (PHQ2-9): Low Risk  (04/29/2022)  Tobacco Use: Low Risk  (05/28/2022)   SDOH Interventions: Housing Interventions: Intervention Not Indicated   Readmission Risk Interventions    04/14/2022    1:48 PM 04/02/2022   11:43 AM  Readmission Risk Prevention Plan  Post Dischage Appt  Complete  Medication Screening  Complete  Transportation Screening Complete Complete  PCP or Specialist Appt within 5-7 Days Complete   Home Care Screening Complete   Medication Review (RN CM) Complete

## 2022-05-29 NOTE — TOC Initial Note (Signed)
Transition of Care [facility]) - Initial/Assessment Note    Patient Details  Name: Frank Hernandez MRN: [id number] Date of Birth: Jul 30, 1940  Transition of Care [facility]) CM/SW Contact:    Rodney Booze, LCSW Phone Number: 05/29/2022, 10:55 AM  Clinical Narrative:                   Expected Discharge Plan:  (Wife is not sure at this time, wants to see how his condition is at time of DC) Barriers to Discharge: No Barriers Identified   Patient Goals and CMS Choice Patient states their goals for this hospitalization and ongoing recovery are:: Wife is unsure at this time patient just left SNF a few days ago  CSW spoke with Kia at [facility] at this time Scarlette Ar has stated that patient can come back to the facility however would need other [place]. Kia also stated that the family would more likely be in co-pay however the business office will work out an arrangement for payment. Please follow up with patients wife to see what arrangement she would like when the patient is DC.           Expected Discharge Plan and Services       Living arrangements for the past 2 months: Single Family Home                                      Prior Living Arrangements/Services Living arrangements for the past 2 months: Single Family Home Lives with:: Spouse Patient language and need for interpreter reviewed:: No Do you feel safe going back to the place where you live?: Yes      Need for Family Participation in Patient Care: No (Comment) Care giver support system in place?: Yes (comment)   Criminal Activity/Legal Involvement Pertinent to Current Situation/Hospitalization: No - Comment as needed  Activities of Daily Living Home Assistive Devices/Equipment: Eyeglasses, Wheelchair, Environmental consultant (specify type) ADL Screening (condition at time of admission) Patient's cognitive ability adequate to safely complete daily activities?: No Is the patient deaf or have difficulty hearing?: No Does the patient have  difficulty seeing, even when wearing glasses/contacts?: No Does the patient have difficulty concentrating, remembering, or making decisions?: No Patient able to express need for assistance with ADLs?: Yes Does the patient have difficulty dressing or bathing?: Yes Independently performs ADLs?: No Communication: Independent Dressing (OT): Dependent Is this a change from baseline?: Pre-admission baseline Grooming: Needs assistance Is this a change from baseline?: Pre-admission baseline Feeding: Needs assistance (Needs set up, feeds self) Is this a change from baseline?: Pre-admission baseline Toileting: Dependent Is this a change from baseline?: Pre-admission baseline In/Out Bed: Dependent Is this a change from baseline?: Pre-admission baseline Walks in Home: Dependent Is this a change from baseline?: Pre-admission baseline Does the patient have difficulty walking or climbing stairs?: Yes Weakness of Legs: Both Weakness of Arms/Hands: None  Permission Sought/Granted                  Emotional Assessment       Orientation: : Oriented to Self, Oriented to Place, Oriented to  Time, Oriented to Situation Alcohol / Substance Use: Not Applicable, Never Used Psych Involvement: No (comment)  Admission diagnosis:  Hypokalemia [E87.6] AKI (acute kidney injury) [N17.9] Patient Active Problem List   Diagnosis Date Noted   Sinus pause 05/29/2022   AKI (acute kidney injury) ([place]) 05/28/2022   Bladder cancer metastasized  to bone ([place]) 05/28/2022   Hypotension 05/28/2022   Dehydration 05/28/2022   Diarrhea 05/28/2022   Lactic acidosis 05/28/2022   Counseling regarding advance care planning and goals of care 04/22/2022   Multiple myeloma not having achieved remission ([place]) 04/21/2022   Vitamin D deficiency 04/19/2022   Malignant neoplasm of bone with metastases ([place]) 04/15/2022   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 ([place]) 04/15/2022   Closed displaced  subtrochanteric fracture of right femur ([place]) 04/13/2022   Normocytic anemia 04/13/2022   Chronic kidney disease, stage 3a ([place]) 04/03/2022   Decreased oral intake 04/02/2022   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   Essential hypertension 04/01/2022   Pain in joint, lower leg 04/01/2022   Effusion of right knee 03/30/2022   Pressure injury of skin 03/30/2022   Effusion, right knee 03/30/2022   Concussion with unknown loss of consciousness status 12/24/2020   Hypokalemia    Rib fractures 06/10/2020   Severe essential hypertension 11/15/2018   Syncope 12/26/2015   Pre-diabetes 12/26/2015   PCP:  [name] (Inactive) Pharmacy:   [place] ([place]), [place] - [address] [place]) [place] [address] Phone: [phone] Fax: [phone]     Social Determinants of Health ([place]) Social History: SDOH Screenings   Food Insecurity: Food Insecurity Present (05/28/2022)  Housing: Low Risk  (05/28/2022)  Transportation Needs: Unmet Transportation Needs (05/28/2022)  Utilities: Not At Risk (05/28/2022)  Depression (PHQ2-9): Low Risk  (04/29/2022)  Tobacco Use: Low Risk  (05/28/2022)   SDOH Interventions: Housing Interventions: Intervention Not Indicated   Readmission Risk Interventions    04/14/2022    1:48 PM 04/02/2022   11:43 AM  Readmission Risk Prevention Plan  Post Dischage Appt  Complete  Medication Screening  Complete  Transportation Screening Complete Complete  PCP or Specialist Appt within 5-7 Days Complete   Home Care Screening Complete   Medication Review (RN CM) Complete

## 2022-05-29 NOTE — Progress Notes (Signed)
Primary RN received a call from Tele that patient had ventricular standstill at 0520AM. Checked patient and he was asymptomatic. Notified Raenette Rover NP, EKG and vitals obtained. Provider placed orders for Potassium replacement and electrolyte add-ons. Provider also came to floor to review EKG and saved Tele strip. Patient resting in bed, primary RN will continue with plan as ordered.

## 2022-05-30 DIAGNOSIS — N179 Acute kidney failure, unspecified: Secondary | ICD-10-CM | POA: Diagnosis not present

## 2022-05-30 DIAGNOSIS — N1831 Chronic kidney disease, stage 3a: Secondary | ICD-10-CM

## 2022-05-30 DIAGNOSIS — E876 Hypokalemia: Secondary | ICD-10-CM | POA: Diagnosis not present

## 2022-05-30 DIAGNOSIS — R197 Diarrhea, unspecified: Secondary | ICD-10-CM | POA: Diagnosis not present

## 2022-05-30 DIAGNOSIS — D649 Anemia, unspecified: Secondary | ICD-10-CM

## 2022-05-30 LAB — CBC WITH DIFFERENTIAL/PLATELET
Abs Immature Granulocytes: 0 10*3/uL (ref 0.00–0.07)
Basophils Absolute: 0 10*3/uL (ref 0.0–0.1)
Basophils Relative: 0 %
Eosinophils Absolute: 0.2 10*3/uL (ref 0.0–0.5)
Eosinophils Relative: 4 %
HCT: 22.7 % — ABNORMAL LOW (ref 39.0–52.0)
Hemoglobin: 7.3 g/dL — ABNORMAL LOW (ref 13.0–17.0)
Immature Granulocytes: 0 %
Lymphocytes Relative: 11 %
Lymphs Abs: 0.5 10*3/uL — ABNORMAL LOW (ref 0.7–4.0)
MCH: 30.9 pg (ref 26.0–34.0)
MCHC: 32.2 g/dL (ref 30.0–36.0)
MCV: 96.2 fL (ref 80.0–100.0)
Monocytes Absolute: 0.9 10*3/uL (ref 0.1–1.0)
Monocytes Relative: 20 %
Neutro Abs: 2.8 10*3/uL (ref 1.7–7.7)
Neutrophils Relative %: 65 %
Platelets: 197 10*3/uL (ref 150–400)
RBC: 2.36 MIL/uL — ABNORMAL LOW (ref 4.22–5.81)
RDW: 17.2 % — ABNORMAL HIGH (ref 11.5–15.5)
WBC: 4.3 10*3/uL (ref 4.0–10.5)
nRBC: 0 % (ref 0.0–0.2)

## 2022-05-30 LAB — BASIC METABOLIC PANEL
Anion gap: 4 — ABNORMAL LOW (ref 5–15)
BUN: 22 mg/dL (ref 8–23)
CO2: 22 mmol/L (ref 22–32)
Calcium: 8.4 mg/dL — ABNORMAL LOW (ref 8.9–10.3)
Chloride: 109 mmol/L (ref 98–111)
Creatinine, Ser: 1.25 mg/dL — ABNORMAL HIGH (ref 0.61–1.24)
GFR, Estimated: 58 mL/min — ABNORMAL LOW (ref >60)
Glucose, Bld: 104 mg/dL — ABNORMAL HIGH (ref 70–99)
Potassium: 4.3 mmol/L (ref 3.5–5.1)
Sodium: 135 mmol/L (ref 135–145)

## 2022-05-30 LAB — MAGNESIUM: Magnesium: 2.2 mg/dL (ref 1.7–2.4)

## 2022-05-30 LAB — OCCULT BLOOD X 1 CARD TO LAB, STOOL: Fecal Occult Bld: NEGATIVE

## 2022-05-30 MED ORDER — LIP MEDEX EX OINT
1.0000 | TOPICAL_OINTMENT | CUTANEOUS | Status: DC | PRN
  Filled 2022-05-30: qty 7

## 2022-05-30 MED ORDER — ENSURE ENLIVE PO LIQD
237.0000 mL | Freq: Two times a day (BID) | ORAL | Status: DC
  Administered 2022-05-30 – 2022-06-01 (×6): 237 mL via ORAL

## 2022-05-30 MED ORDER — LOPERAMIDE HCL 2 MG PO CAPS
2.0000 mg | ORAL_CAPSULE | ORAL | Status: DC | PRN
  Administered 2022-05-30: 2 mg via ORAL
  Filled 2022-05-30: qty 1

## 2022-05-30 MED ORDER — FERROUS SULFATE 325 (65 FE) MG PO TABS
325.0000 mg | ORAL_TABLET | Freq: Two times a day (BID) | ORAL | Status: DC
  Administered 2022-05-30 – 2022-06-01 (×6): 325 mg via ORAL
  Filled 2022-05-30 (×6): qty 1

## 2022-05-30 MED ORDER — DOXAZOSIN MESYLATE 2 MG PO TABS
2.0000 mg | ORAL_TABLET | Freq: Every evening | ORAL | Status: DC
  Administered 2022-05-30 – 2022-05-31 (×2): 2 mg via ORAL
  Filled 2022-05-30 (×2): qty 1

## 2022-05-30 MED ORDER — VITAMIN D (ERGOCALCIFEROL) 1.25 MG (50000 UNIT) PO CAPS
50000.0000 [IU] | ORAL_CAPSULE | ORAL | Status: DC
  Administered 2022-05-30: 50000 [IU] via ORAL
  Filled 2022-05-30: qty 1

## 2022-05-30 NOTE — Progress Notes (Addendum)
PROGRESS NOTE    Frank Hernandez  [id number] DOB: 08-Sep-1940 DOA: 05/28/2022 PCP: [name] (Inactive)   Chief Complaint  Patient presents with   Hypotension    Brief Narrative: Patient is a 82 year old gentleman history of remote bladder cancer, recent pathologic fracture of the right hip hospitalized diagnosed with multiple myeloma, CKD, hypertension recently started on treatment for multiple myeloma presented to the ED with copious watery diarrhea, significant weakness, noted to be hypotensive on presentation.  Patient placed on IV fluids, stool studies obtained concerning for C. difficile infection and patient started on oral vancomycin pending PCR results.  BP responded to IV fluids.   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 -Stool studies sent with positive C. difficile antigen, negative C. difficile toxin, -Due to presentation of multiple episodes of watery loose stools, positive C. difficile antigen, elevated lactic acid level patient started on oral vancomycin. -GI pathogen panel negative. -Toxigenic C. difficile by PCR negative. -Consistency of stools have improved. -Patient currently afebrile, normal white count, no significant abdominal pain and as such as toxigenic C. difficile by PCR was negative may likely be colonized. -Saline lock IV fluids. -Discontinue empiric vancomycin. -Imodium as needed.  2.  Severe electrolyte abnormalities/severe hypokalemia/hypomagnesemia -Secondary to GI losses -Potassium on admission noted as low as 2.5. -Potassium currently at 4.3 this  morning, magnesium at 2.2. -Repeat labs in the AM.  3.  Acute on chronic kidney stage IIIa -Likely secondary to prerenal azotemia secondary to diarrhea. -Patient also noted to be hypotensive on presentation. - baseline creatinine approximately 1.5-1.7. -Renal function improving with hydration. -Creatinine down to 1.25. -Saline lock IV fluids. -Avoid nephrotoxic agents.  4.  Dehydration -Saline lock IV fluids.  5.  Ventricular standstill/sinus pause -Patient noted to have a ventricular standstill/sinus pause around 5:20 AM, per RN note patient remained asymptomatic.  Unsure as to whether patient likely sleeping at that time. -Patient noted with severe electrolyte abnormalities of hypomagnesemia, hypokalemia which are being repleted. -Case discussed with cardiology, Dr. Curt Bears who felt if sinus pause/ventricular standstill okay during the sleep cycle nothing further to do at this time as patient not on any AV nodal blocking agents.  Per cardiology monitor and if pulse recurs while patient awake for patient symptomatic then will need a formal cardiology consult.   -Supportive care.  6.  Hypertension -Patient had presented with hypotension and as such antihypertensive medications currently on hold.   -Saline lock IV fluids.   7.  Hypovolemic hypotension -Secondary to volume depletion secondary to GI losses from diarrhea in the setting of antihypertensive medications. -BP improved with hydration. -Continue to hold antihypertensive medications. -Saline lock IV fluids.  8.  Recently diagnosed multiple myeloma -Outpatient follow-up with hematology/oncology, Dr.Kale. -Hematology/oncology notified of admission via epic.  9.  Normocytic anemia -Likely secondary to recently diagnosed multiple myeloma. -FOBT negative. -Patient with no overt bleeding. -Hemoglobin currently at 7.3. -Transfusion threshold hemoglobin < 7.    DVT prophylaxis: SCDs Code Status: Full Family Communication:  Updated patient.  No family at bedside. Disposition: Likely home with home health versus SNF  Status is: Inpatient The patient will require care spanning > 2 midnights and should be moved to inpatient because: Inpatient   Consultants:  None  Procedures:  None  Antimicrobials:  Oral vancomycin 05/29/2022>>> 05/30/2022   Subjective: Sitting up in recliner.  States he feels much better than on admission.  No chest pain.  No shortness of breath.  No abdominal pain.  States he ambulated with PT in the hallway today and did well.  2 stools which were more mushy per RN.  No abdominal pain.  Objective: Vitals:   05/29/22 0547 05/29/22 1153 05/29/22 2108 05/30/22 0448  BP: 113/69 126/69 (!) 104/59 130/76  Pulse:  71 91 77  Resp:  16 19 19   Temp:  98.3 F (36.8 C) 97.9 F (36.6 C) 98.6 F (37 C)  TempSrc:  Oral  Oral  SpO2:  100% 99% 98%  Weight:      Height:        Intake/Output Summary (Last 24 hours) at 05/30/2022 1211 Last data filed at 05/30/2022 0913 Gross per 24 hour  Intake 957 ml  Output 650 ml  Net 307 ml    Filed Weights   05/28/22 0845  Weight: 92.5 kg    Examination:  General exam: NAD Respiratory system: CTAB.  No wheezes, no crackles, no rhonchi.  Fair air movement.  Speaking in full sentences.  Cardiovascular system: RRR no murmurs rubs or gallops.  No JVD.  No lower extremity edema.  Gastrointestinal system: Abdomen is soft, nontender, nondistended, positive bowel sounds.  No rebound.  No guarding.  Central nervous system: Alert and oriented. No focal neurological deficits. Extremities: Symmetric 5 x 5 power. Skin: No rashes, lesions or ulcers Psychiatry: Judgement and insight appear normal. Mood & affect appropriate.     Data Reviewed: I have personally reviewed following labs and imaging studies  CBC: Recent Labs  Lab 05/28/22 0720 05/28/22 0940 05/28/22 1446 05/29/22 0406 05/30/22 0349  WBC 5.4 6.0 6.6 4.7 4.3  NEUTROABS 4.0 4.4  --   --   2.8  HGB 8.9* 8.4* 8.7* 7.8* 7.3*  HCT 26.1* 26.4* 26.8* 23.7* 22.7*  MCV 90.3 96.7 96.1 93.3 96.2  PLT 194 180 178 180 197     Basic Metabolic Panel: Recent Labs  Lab 05/28/22 0720 05/28/22 0940 05/28/22 1446 05/28/22 1814 05/29/22 0406 05/30/22 0349  NA 135 132*  --  134* 135 135  K 2.6* 2.5*  --  3.5 3.1* 4.3  CL 104 103  --  107 108 109  CO2 19* 19*  --  18* 21* 22  GLUCOSE 97 140*  --  94 79 104*  BUN 29* 30*  --  27* 25* 22  CREATININE 2.24* 2.21* 1.88* 1.76* 1.48* 1.25*  CALCIUM 8.5* 8.3*  --  8.3* 8.1* 8.4*  MG  --   --  1.6*  --  1.6* 2.2  PHOS  --   --   --   --  2.8  --      GFR: Estimated Creatinine Clearance: 52.5 mL/min (A) (by C-G formula based on SCr of 1.25 mg/dL (H)).  Liver Function Tests: Recent Labs  Lab 05/28/22 0720 05/28/22 0940  AST 33 36  ALT 23 24  ALKPHOS 171* 154*  BILITOT 0.9 0.9  PROT 6.3* 6.2*  ALBUMIN 3.5 3.2*     CBG: No results for input(s): "GLUCAP" in the last 168 hours.   Recent Results (from the past 240 hour(s))  C. Diff by PCR, Reflexed  Status: None   Collection Time: 05/28/22 12:38 PM  Result Value Ref Range Status   Toxigenic C. Difficile by PCR NEGATIVE NEGATIVE Final    Comment: Patient is colonized with non toxigenic C. difficile. May not need treatment unless significant symptoms are present. Performed at [facility], [place] [address] [place], [place] [address]   Gastrointestinal Panel by PCR , Stool     Status: None   Collection Time: 05/28/22  4:32 PM   Specimen: Stool  Result Value Ref Range Status   Campylobacter species NOT DETECTED NOT DETECTED Final   Plesimonas shigelloides NOT DETECTED NOT DETECTED Final   Salmonella species NOT DETECTED NOT DETECTED Final   Yersinia enterocolitica NOT DETECTED NOT DETECTED Final   Vibrio species NOT DETECTED NOT DETECTED Final   Vibrio cholerae NOT DETECTED NOT DETECTED Final   Enteroaggregative E coli (EAEC) NOT DETECTED NOT DETECTED Final    Enteropathogenic E coli (EPEC) NOT DETECTED NOT DETECTED Final   Enterotoxigenic E coli (ETEC) NOT DETECTED NOT DETECTED Final   Shiga like toxin producing E coli (STEC) NOT DETECTED NOT DETECTED Final   Shigella/Enteroinvasive E coli (EIEC) NOT DETECTED NOT DETECTED Final   Cryptosporidium NOT DETECTED NOT DETECTED Final   Cyclospora cayetanensis NOT DETECTED NOT DETECTED Final   Entamoeba histolytica NOT DETECTED NOT DETECTED Final   Giardia lamblia NOT DETECTED NOT DETECTED Final   Adenovirus F40/41 NOT DETECTED NOT DETECTED Final   Astrovirus NOT DETECTED NOT DETECTED Final   Norovirus GI/GII NOT DETECTED NOT DETECTED Final   Rotavirus A NOT DETECTED NOT DETECTED Final   Sapovirus (I, II, IV, and V) NOT DETECTED NOT DETECTED Final    Comment: Performed at [facility], [place] [address] [place], [place] [address]  C Difficile Quick Screen w PCR reflex     Status: Abnormal   Collection Time: 05/28/22  4:33 PM   Specimen: Stool  Result Value Ref Range Status   C Diff antigen POSITIVE (A) NEGATIVE Final   C Diff toxin NEGATIVE NEGATIVE Final   C Diff interpretation Results are indeterminate. See PCR results.  Final    Comment: Performed at [facility], [place] [address] [place], [place] [address]         Radiology Studies: No results found.      Scheduled Meds:  acyclovir  400 mg Oral BID   aspirin EC  81 mg Oral Daily   doxazosin  2 mg Oral QPM   feeding supplement  237 mL Oral BID BM   ferrous sulfate  325 mg Oral BID WC   heparin  5,000 Units Subcutaneous Q8H   leptospermum manuka honey  1 Application Topical Daily   Vitamin D (Ergocalciferol)  50,000 Units Oral Weekly   Continuous Infusions:  lactated ringers 50 mL/hr at 05/30/22 0911     LOS: 1 day    Time spent: 40 minutes    Irine Seal, MD [facility]   To contact the attending provider between 7A-7P or the covering provider during after hours 7P-7A, please log  into the web site [email or web address] and access using universal [place] password for that web site. If you do not have the password, please call the hospital operator.  05/30/2022, 12:11 PM

## 2022-05-30 NOTE — Progress Notes (Signed)
Pt care assumed from previous RN at 1500 this shift. I have reviewed the previous RN's assessment and agree with their findings. Will continue to follow the plan of care

## 2022-05-30 NOTE — Plan of Care (Signed)
  Problem: Skin Integrity: Goal: Risk for impaired skin integrity will decrease Outcome: Progressing   Problem: Pain Managment: Goal: General experience of comfort will improve Outcome: Progressing   Problem: Safety: Goal: Ability to remain free from injury will improve Outcome: Progressing   Problem: Coping: Goal: Level of anxiety will decrease Outcome: Progressing

## 2022-05-30 NOTE — Evaluation (Signed)
Physical Therapy Evaluation Patient Details Name: Frank Hernandez MRN: [id number] DOB: 06/15/1940 Today's Date: 05/30/2022  History of Present Illness  82 year old gentleman history of remote bladder cancer, pt underwent IM nail R femur d/t pathological fx +Bx on 04/17/22 hospitalized diagnosed with multiple myeloma, CKD, hypertension recently started on treatment for multiple myeloma presented to the ED with copious watery diarrhea, significant weakness, noted to be hypotensive on presentation  Clinical Impression  Pt admitted with above diagnosis.  Pt currently with functional limitations due to the deficits listed below (see PT Problem List). Pt will benefit from acute skilled PT to increase their independence and safety with mobility to allow discharge.  Pt reports feeling better since admission.  Pt able to ambulate 60 feet with RW.  Pt reports recently home from SNF.  Pt lives with his spouse and typically uses walker for mobility.        Recommendations for follow up therapy are one component of a multi-disciplinary discharge planning process, led by the attending physician.  Recommendations may be updated based on patient status, additional functional criteria and insurance authorization.  Follow Up Recommendations       Assistance Recommended at Discharge PRN  Patient can return home with the following  A little help with bathing/dressing/bathroom;Help with stairs or ramp for entrance;Assistance with cooking/housework;Assist for transportation    Equipment Recommendations None recommended by PT  Recommendations for Other Services       Functional Status Assessment Patient has had a recent decline in their functional status and demonstrates the ability to make significant improvements in function in a reasonable and predictable amount of time.     Precautions / Restrictions Precautions Precautions: Fall Precaution Comments: loose stool, wears depends at  home Restrictions Weight Bearing Restrictions: No      Mobility  Bed Mobility               General bed mobility comments: pt in recliner    Transfers Overall transfer level: Needs assistance Equipment used: Rolling walker (2 wheels) Transfers: Sit to/from Stand Sit to Stand: Min guard           General transfer comment: pt used armrests to self assist, decreased control of descent however pt did reach back for armrest    Ambulation/Gait Ambulation/Gait assistance: Min guard Gait Distance (Feet): 60 Feet Assistive device: Rolling walker (2 wheels) Gait Pattern/deviations: Step-through pattern, Decreased stride length Gait velocity: decr     General Gait Details: pt reports feeling well, pt states usually he has w/c following (likely with walking at facility however recently home), distance limited by fatigue per pt, steady with RW and no LOB observed  Stairs            Wheelchair Mobility    Modified Rankin (Stroke Patients Only)       Balance Overall balance assessment: Needs assistance         Standing balance support: No upper extremity supported Standing balance-Leahy Scale: Fair Standing balance comment: able to static stand without UE support, utilizing walker for mobility                             Pertinent Vitals/Pain Pain Assessment Pain Assessment: No/denies pain    Home Living Family/patient expects to be discharged to:: Private residence Living Arrangements: Spouse/significant other Available Help at Discharge: Family;Available 24 hours/day Type of Home: House Home Access: Ramped entrance       Home Layout:  One level Home Equipment: Conservation officer, nature (2 wheels);Wheelchair Dealer (4 wheels)      Prior Function Prior Level of Function : Needs assist             Mobility Comments: recently home from SNF, ambulatory with RW       Hand Dominance        Extremity/Trunk Assessment         Lower Extremity Assessment Lower Extremity Assessment: Generalized weakness       Communication   Communication: No difficulties  Cognition Arousal/Alertness: Awake/alert Behavior During Therapy: WFL for tasks assessed/performed Overall Cognitive Status: Within Functional Limits for tasks assessed                                          General Comments      Exercises     Assessment/Plan    PT Assessment Patient needs continued PT services  PT Problem List Decreased mobility;Decreased activity tolerance;Decreased strength       PT Treatment Interventions Gait training;DME instruction;Therapeutic exercise;Functional mobility training;Therapeutic activities;Patient/family education;Balance training    PT Goals (Current goals can be found in the Care Plan section)  Acute Rehab PT Goals PT Goal Formulation: With patient Time For Goal Achievement: 06/13/22 Potential to Achieve Goals: Good    Frequency Min 3X/week     Co-evaluation               AM-PAC PT "6 Clicks" Mobility  Outcome Measure Help needed turning from your back to your side while in a flat bed without using bedrails?: A Little Help needed moving from lying on your back to sitting on the side of a flat bed without using bedrails?: A Little Help needed moving to and from a bed to a chair (including a wheelchair)?: A Little Help needed standing up from a chair using your arms (e.g., wheelchair or bedside chair)?: A Little Help needed to walk in hospital room?: A Little Help needed climbing 3-5 steps with a railing? : A Little 6 Click Score: 18    End of Session Equipment Utilized During Treatment: Gait belt Activity Tolerance: Patient tolerated treatment well Patient left: in chair;with call bell/phone within reach;with chair alarm set Nurse Communication: Mobility status PT Visit Diagnosis: Difficulty in walking, not elsewhere classified (R26.2)    Time: [id number] PT Time  Calculation (min) (ACUTE ONLY): 16 min   Charges:   PT Evaluation $PT Eval Low Complexity: 1 Low        Kati PT, DPT Physical Therapist Acute Rehabilitation Services Office: [phone]   Frank Hernandez 05/30/2022, 12:06 PM

## 2022-05-30 NOTE — Progress Notes (Signed)
OT Cancellation Note  Patient Details Name: Frank Hernandez MRN: [id number] DOB: Jul 18, 1940   Cancelled Treatment:    Reason Eval/Treat Not Completed: Fatigue/lethargy limiting ability to participate Patient and nurse reporting patient just got back to bed after having busy day moving around with PT and nursing. OT to continue to follow and check back as schedule will allow. Rennie Plowman, [place] Acute Rehabilitation Department Office# [phone]  05/30/2022, 2:38 PM

## 2022-05-31 ENCOUNTER — Other Ambulatory Visit: Payer: Self-pay

## 2022-05-31 DIAGNOSIS — N1831 Chronic kidney disease, stage 3a: Secondary | ICD-10-CM | POA: Diagnosis not present

## 2022-05-31 DIAGNOSIS — R197 Diarrhea, unspecified: Secondary | ICD-10-CM | POA: Diagnosis not present

## 2022-05-31 DIAGNOSIS — E876 Hypokalemia: Secondary | ICD-10-CM | POA: Diagnosis not present

## 2022-05-31 DIAGNOSIS — N179 Acute kidney failure, unspecified: Secondary | ICD-10-CM | POA: Diagnosis not present

## 2022-05-31 LAB — CBC WITH DIFFERENTIAL/PLATELET
Abs Immature Granulocytes: 0.01 10*3/uL (ref 0.00–0.07)
Basophils Absolute: 0 10*3/uL (ref 0.0–0.1)
Basophils Relative: 0 %
Eosinophils Absolute: 0.1 10*3/uL (ref 0.0–0.5)
Eosinophils Relative: 3 %
HCT: 23.3 % — ABNORMAL LOW (ref 39.0–52.0)
Hemoglobin: 7.6 g/dL — ABNORMAL LOW (ref 13.0–17.0)
Immature Granulocytes: 0 %
Lymphocytes Relative: 12 %
Lymphs Abs: 0.6 10*3/uL — ABNORMAL LOW (ref 0.7–4.0)
MCH: 30.6 pg (ref 26.0–34.0)
MCHC: 32.6 g/dL (ref 30.0–36.0)
MCV: 94 fL (ref 80.0–100.0)
Monocytes Absolute: 1 10*3/uL (ref 0.1–1.0)
Monocytes Relative: 22 %
Neutro Abs: 2.9 10*3/uL (ref 1.7–7.7)
Neutrophils Relative %: 63 %
Platelets: 218 10*3/uL (ref 150–400)
RBC: 2.48 MIL/uL — ABNORMAL LOW (ref 4.22–5.81)
RDW: 17.4 % — ABNORMAL HIGH (ref 11.5–15.5)
WBC: 4.6 10*3/uL (ref 4.0–10.5)
nRBC: 0 % (ref 0.0–0.2)

## 2022-05-31 LAB — BASIC METABOLIC PANEL
Anion gap: 4 — ABNORMAL LOW (ref 5–15)
BUN: 17 mg/dL (ref 8–23)
CO2: 23 mmol/L (ref 22–32)
Calcium: 8.5 mg/dL — ABNORMAL LOW (ref 8.9–10.3)
Chloride: 106 mmol/L (ref 98–111)
Creatinine, Ser: 1.11 mg/dL (ref 0.61–1.24)
GFR, Estimated: >60 mL/min (ref >60)
Glucose, Bld: 125 mg/dL — ABNORMAL HIGH (ref 70–99)
Potassium: 3.8 mmol/L (ref 3.5–5.1)
Sodium: 133 mmol/L — ABNORMAL LOW (ref 135–145)

## 2022-05-31 LAB — KAPPA/LAMBDA LIGHT CHAINS
Kappa free light chain: 18.5 mg/L (ref 3.3–19.4)
Kappa, lambda light chain ratio: 1.75 — ABNORMAL HIGH (ref 0.26–1.65)
Lambda free light chains: 10.6 mg/L (ref 5.7–26.3)

## 2022-05-31 NOTE — Progress Notes (Signed)
Physical Therapy Treatment Patient Details Name: Frank Hernandez MRN: [id number] DOB: 1940-03-15 Today's Date: 05/31/2022   History of Present Illness 82 year old gentleman history of remote bladder cancer, pt underwent IM nail R femur d/t pathological fx +Bx on 04/17/22 hospitalized diagnosed with multiple myeloma, CKD, hypertension recently started on treatment for multiple myeloma presented to the ED with copious watery diarrhea, significant weakness, noted to be hypotensive on presentation    PT Comments    Pt agreeable to working with therapy. Asked pt if he needed to use bathroom prior to ambulating, he stated "no." Pt then insisted on calling his wife before attempting to ambulate. After getting off phone, he then stated "I have to use the bathroom." Bowel incontinence/loose stool on the way to the bathroom. Assisted with cleanup/changing gown. After washing his hands, pt was about to ambulate back to the recliner.Too fatigued to attempt hallway ambulation. Will continue to follow and progress activity as tolerated.     Recommendations for follow up therapy are one component of a multi-disciplinary discharge planning process, led by the attending physician.  Recommendations may be updated based on patient status, additional functional criteria and insurance authorization.  Follow Up Recommendations       Assistance Recommended at Discharge Frequent or constant Supervision/Assistance  Patient can return home with the following A little help with bathing/dressing/bathroom;Help with stairs or ramp for entrance;Assistance with cooking/housework;Assist for transportation   Equipment Recommendations  None recommended by PT    Recommendations for Other Services       Precautions / Restrictions Precautions Precautions: Fall Precaution Comments: loose stool, wears depends at home Restrictions Weight Bearing Restrictions: No     Mobility  Bed Mobility               General  bed mobility comments: oob in recliner    Transfers Overall transfer level: Needs assistance Equipment used: Rolling walker (2 wheels) Transfers: Sit to/from Stand Sit to Stand: Mod assist (Min guard from recliner)           General transfer comment: MIn guard from recliner with use of 2 armrests. Mod A from low toilet with only assist for grabbar. Cues for safety, technique, hand placement.    Ambulation/Gait Ambulation/Gait assistance: Min assist Gait Distance (Feet): 15 Feet Assistive device: Rolling walker (2 wheels) Gait Pattern/deviations: Step-through pattern, Decreased stride length       General Gait Details: walked to and from bathroom only on today. distance limited by facility. dyspnea 2/4   Stairs             Wheelchair Mobility    Modified Rankin (Stroke Patients Only)       Balance Overall balance assessment: Needs assistance         Standing balance support: During functional activity, Reliant on assistive device for balance Standing balance-Leahy Scale: Poor                              Cognition Arousal/Alertness: Awake/alert Behavior During Therapy: WFL for tasks assessed/performed Overall Cognitive Status: Impaired/Different from baseline Area of Impairment: Awareness, Memory, Orientation, Problem solving                 Orientation Level: Disoriented to, Situation   Memory: Decreased short-term memory     Awareness: Emergent Problem Solving: Requires verbal cues General Comments: pt insisted he call his wife first before mobilizing        Exercises  General Comments        Pertinent Vitals/Pain Pain Assessment Pain Assessment: No/denies pain    Home Living                   Home Equipment: Rolling Walker (2 wheels);Rollator (4 wheels);Wheelchair - manual;BSC/3in1 Additional Comments: Pt sleeps in his Risk analyst.  BSC atop toilet.    Prior Function            PT Goals  (current goals can now be found in the care plan section) Progress towards PT goals: Progressing toward goals    Frequency    Min 3X/week      PT Plan Current plan remains appropriate    Co-evaluation              AM-PAC PT "6 Clicks" Mobility   Outcome Measure  Help needed turning from your back to your side while in a flat bed without using bedrails?: A Little Help needed moving from lying on your back to sitting on the side of a flat bed without using bedrails?: A Little Help needed moving to and from a bed to a chair (including a wheelchair)?: A Little Help needed standing up from a chair using your arms (e.g., wheelchair or bedside chair)?: A Little Help needed to walk in hospital room?: A Little Help needed climbing 3-5 steps with a railing? : A Lot 6 Click Score: 17    End of Session Equipment Utilized During Treatment: Gait belt Activity Tolerance: Patient limited by fatigue;Patient tolerated treatment well Patient left: in chair;with call bell/phone within reach;with chair alarm set   PT Visit Diagnosis: Difficulty in walking, not elsewhere classified (R26.2);Muscle weakness (generalized) (M62.81)     Time: [id number] PT Time Calculation (min) (ACUTE ONLY): 26 min  Charges:  $Gait Training: 23-37 mins                         Doreatha Massed, PT Acute Rehabilitation  Office: [phone]

## 2022-05-31 NOTE — Plan of Care (Signed)

## 2022-05-31 NOTE — Progress Notes (Signed)
PROGRESS NOTE    LEA ASA  [id number] DOB: 09-15-1940 DOA: 05/28/2022 PCP: [name] (Inactive)   Chief Complaint  Patient presents with   Hypotension    Brief Narrative: Patient is a 82 year old gentleman history of remote bladder cancer, recent pathologic fracture of the right hip hospitalized diagnosed with multiple myeloma, CKD, hypertension recently started on treatment for multiple myeloma presented to the ED with copious watery diarrhea, significant weakness, noted to be hypotensive on presentation.  Patient placed on IV fluids, stool studies obtained concerning for C. difficile infection and patient started on oral vancomycin pending PCR results.  BP responded to IV fluids.   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 -Stool studies sent with positive C. difficile antigen, negative C. difficile toxin, -Due to presentation of multiple episodes of watery loose stools, positive C. difficile antigen, elevated lactic acid level patient started on oral vancomycin. -GI pathogen panel negative. -Toxigenic C. difficile by PCR negative. -Consistency of stools have improved. -Patient currently afebrile, normal white count, no significant abdominal pain and as such as toxigenic C. difficile by PCR was negative may likely be colonized. -Vancomycin initially started empirically which have subsequently been discontinued.   -IV fluids saline lock.   -Imodium as needed.   2.  Severe electrolyte abnormalities/severe hypokalemia/hypomagnesemia -Secondary to GI losses -Potassium on admission noted as low as  2.5. -Potassium currently at 3.8 this morning.  Magnesium at 2.2.   -Repeat labs in the AM.  3.  Acute on chronic kidney stage IIIa -Likely secondary to prerenal azotemia secondary to diarrhea. -Patient also noted to be hypotensive on presentation. - baseline creatinine approximately 1.5-1.7. -Renal function improving with hydration. -Creatinine currently at 1.11. -IV fluids discontinued. -Avoid nephrotoxic agents.  4.  Dehydration -Saline lock IV fluids.  5.  Ventricular standstill/sinus pause/A-fib with slow ventricular response -Patient noted to have a ventricular standstill/sinus pause around 5:20 AM on 05/30/2022, per RN note patient remained asymptomatic.  Unsure as to whether patient likely sleeping at that time. -Patient noted with severe electrolyte abnormalities of hypomagnesemia, hypokalemia which have been corrected.  -Case discussed with cardiology, Dr. Curt Bears who felt if sinus pause/ventricular standstill okay during the sleep cycle nothing further to do at this time as patient not on any AV nodal blocking agents.  Per cardiology monitor and if pulse recurs while patient awake for patient symptomatic then will need a formal cardiology consult.   -Patient noted now on telemetry to be in A-fib with slow ventricular response. -Patient with recent 2D echo 04/22/2022 with a EF of 60 to 65%, no wall motion abnormalities, normal right ventricular systolic function, grossly normal mitral valve, no evidence of AVR, no evidence of mitral stenosis. -Due to new onset A-fib with slow ventricular response rate, significant pause/ventricular standstill will consult with cardiology for further evaluation and management. -Supportive care.  6.  Hypertension -Patient had presented with hypotension and as such antihypertensive medications currently on hold.   -Saline lock IV fluids.   7.  Hypovolemic hypotension -Secondary to volume depletion secondary to GI losses from diarrhea in the setting  of antihypertensive medications. -  BP improved with hydration. -Continue to hold antihypertensive medications.   -IV fluids discontinued.   8.  Recently diagnosed multiple myeloma -Outpatient follow-up with hematology/oncology, Dr.Kale. -Hematology/oncology notified of admission via epic.  9.  Normocytic anemia -Likely secondary to recently diagnosed multiple myeloma. -FOBT negative. -Patient with no overt bleeding. -Hemoglobin currently at 7.3. -Transfusion threshold hemoglobin < 7.    DVT prophylaxis: SCDs Code Status: Full Family Communication: Updated patient.  Updated wife at bedside.  Disposition: Likely home with home health.  Status is: Inpatient The patient will require care spanning > 2 midnights and should be moved to inpatient because: Inpatient   Consultants:  None  Procedures:  None  Antimicrobials:  Oral vancomycin 05/29/2022>>> 05/30/2022   Subjective: Sitting up in bed.  Wife at bedside.  States stool with more consistency.  No chest pain.  No shortness of breath.  Overall feels well.  No significant further pauses noted on telemetry.  Patient however in A-fib with slow ventricular response on telemetry.  Objective: Vitals:   05/30/22 2047 05/31/22 0055 05/31/22 0540 05/31/22 0938  BP: 123/63 121/72 124/72   Pulse: 80 69 72 85  Resp: 20 18 18    Temp: 98.9 F (37.2 C) 98.5 F (36.9 C) 98.5 F (36.9 C)   TempSrc: Oral Oral Oral   SpO2: 99% 100% 100% 100%  Weight:      Height:        Intake/Output Summary (Last 24 hours) at 05/31/2022 1259 Last data filed at 05/31/2022 0540 Gross per 24 hour  Intake 209.78 ml  Output 875 ml  Net -665.22 ml    Filed Weights   05/28/22 0845  Weight: 92.5 kg    Examination:  General exam: NAD Respiratory system: Lungs clear to quotation bilaterally.  No wheezes, no crackles, no rhonchi.  Fair air movement.  Speaking in full sentences.   Cardiovascular system: Irregularly irregular.  No murmurs rubs or  gallops.  No JVD.  No significant lower extremity edema.  Gastrointestinal system: Abdomen is soft, nontender, nondistended, positive bowel sounds.  No rebound.  No guarding.  Central nervous system: Alert and oriented. No focal neurological deficits. Extremities: Symmetric 5 x 5 power. Skin: No rashes, lesions or ulcers Psychiatry: Judgement and insight appear normal. Mood & affect appropriate.     Data Reviewed: I have personally reviewed following labs and imaging studies  CBC: Recent Labs  Lab 05/28/22 0720 05/28/22 0940 05/28/22 1446 05/29/22 0406 05/30/22 0349 05/31/22 0855  WBC 5.4 6.0 6.6 4.7 4.3 4.6  NEUTROABS 4.0 4.4  --   --  2.8 2.9  HGB 8.9* 8.4* 8.7* 7.8* 7.3* 7.6*  HCT 26.1* 26.4* 26.8* 23.7* 22.7* 23.3*  MCV 90.3 96.7 96.1 93.3 96.2 94.0  PLT 194 180 178 180 197 218     Basic Metabolic Panel: Recent Labs  Lab 05/28/22 0940 05/28/22 1446 05/28/22 1814 05/29/22 0406 05/30/22 0349 05/31/22 0855  NA 132*  --  134* 135 135 133*  K 2.5*  --  3.5 3.1* 4.3 3.8  CL 103  --  107 108 109 106  CO2 19*  --  18* 21* 22 23  GLUCOSE 140*  --  94 79 104* 125*  BUN 30*  --  27* 25* 22 17  CREATININE 2.21* 1.88* 1.76* 1.48* 1.25* 1.11  CALCIUM 8.3*  --  8.3* 8.1* 8.4* 8.5*  MG  --  1.6*  --  1.6* 2.2  --   PHOS  --   --   --  2.8  --   --      GFR: Estimated Creatinine Clearance: 59.1 mL/min (by C-G formula based on SCr of 1.11 mg/dL).  Liver Function Tests: Recent Labs  Lab 05/28/22 0720 05/28/22 0940  AST 33 36  ALT 23 24  ALKPHOS 171* 154*  BILITOT 0.9 0.9  PROT 6.3* 6.2*  ALBUMIN 3.5 3.2*     CBG: No results for input(s): "GLUCAP" in the last 168 hours.   Recent Results (from the past 240 hour(s))  C. Diff by PCR, Reflexed     Status: None   Collection Time: 05/28/22 12:38 PM  Result Value Ref Range Status   Toxigenic C. Difficile by PCR NEGATIVE NEGATIVE Final    Comment: Patient is colonized with non toxigenic C. difficile. May not need  treatment unless significant symptoms are present. Performed at [facility], [place] [address], [place], [place] [address]   Gastrointestinal Panel by PCR , Stool     Status: None   Collection Time: 05/28/22  4:32 PM   Specimen: Stool  Result Value Ref Range Status   Campylobacter species NOT DETECTED NOT DETECTED Final   Plesimonas shigelloides NOT DETECTED NOT DETECTED Final   Salmonella species NOT DETECTED NOT DETECTED Final   Yersinia enterocolitica NOT DETECTED NOT DETECTED Final   Vibrio species NOT DETECTED NOT DETECTED Final   Vibrio cholerae NOT DETECTED NOT DETECTED Final   Enteroaggregative E coli (EAEC) NOT DETECTED NOT DETECTED Final   Enteropathogenic E coli (EPEC) NOT DETECTED NOT DETECTED Final   Enterotoxigenic E coli (ETEC) NOT DETECTED NOT DETECTED Final   Shiga like toxin producing E coli (STEC) NOT DETECTED NOT DETECTED Final   Shigella/Enteroinvasive E coli (EIEC) NOT DETECTED NOT DETECTED Final   Cryptosporidium NOT DETECTED NOT DETECTED Final   Cyclospora cayetanensis NOT DETECTED NOT DETECTED Final   Entamoeba histolytica NOT DETECTED NOT DETECTED Final   Giardia lamblia NOT DETECTED NOT DETECTED Final   Adenovirus F40/41 NOT DETECTED NOT DETECTED Final   Astrovirus NOT DETECTED NOT DETECTED Final   Norovirus GI/GII NOT DETECTED NOT DETECTED Final   Rotavirus A NOT DETECTED NOT DETECTED Final   Sapovirus (I, II, IV, and V) NOT DETECTED NOT DETECTED Final    Comment: Performed at [facility], [address] [place], [place] [address]  C Difficile Quick Screen w PCR reflex     Status: Abnormal   Collection Time: 05/28/22  4:33 PM   Specimen: Stool  Result Value Ref Range Status   C Diff antigen POSITIVE (A) NEGATIVE Final   C Diff toxin NEGATIVE NEGATIVE Final   C Diff interpretation Results are indeterminate. See PCR results.  Final    Comment: Performed at [facility], [place] [address], [place], [place] [address]          Radiology Studies: No results found.      Scheduled Meds:  acyclovir  400 mg Oral BID   aspirin EC  81 mg Oral Daily   doxazosin  2 mg Oral QPM   feeding supplement  237 mL Oral BID BM   ferrous sulfate  325 mg Oral BID WC   heparin  5,000 Units Subcutaneous Q8H   leptospermum manuka honey  1 Application Topical Daily   Vitamin D (Ergocalciferol)  50,000 Units Oral Weekly   Continuous Infusions:     LOS: 2 days    Time spent: 40 minutes    Irine Seal, MD [facility]   To  contact the attending provider between 7A-7P or the covering provider during after hours 7P-7A, please log into the web site [email or web address] and access using universal [place] password for that web site. If you do not have the password, please call the hospital operator.  05/31/2022, 12:59 PM

## 2022-05-31 NOTE — Plan of Care (Signed)

## 2022-05-31 NOTE — Evaluation (Signed)
Occupational Therapy Evaluation Patient Details Name: Frank Hernandez MRN: [id number] DOB: 07-23-1940 Today's Date: 05/31/2022   History of Present Illness 82 year old gentleman history of remote bladder cancer, 82 year old gentleman history of remote bladder cancer, pt underwent IM nail R femur d/t pathological fx +Bx on 04/17/22 hospitalized diagnosed with multiple myeloma, CKD, hypertension recently started on treatment for multiple myeloma presented to the ED with copious watery diarrhea, significant weakness, noted to be hypotensive on presentation   Clinical Impression   Patient is currently requiring assistance with ADLs including up to maximum assist with Lower body ADLs, up to minimal assist with Upper body ADLs,  as well as Min assist with functional transfers to toilet.  Current level of function is close to patient's recent baseline, however pt has been in and out of hospitals and SNFs since January and has only been home 4 days, so anticipate that there is still rehab potential to decrease caregiver burden.    During this evaluation, patient was limited by generalized weakness, impaired activity tolerance, and cognitive deficits, all of which has the potential to impact patient's safety and independence during functional mobility, as well as performance for ADLs.  Patient lives at home, with his wife who is able to provide 24/7 supervision and assistance but would like pt to initiate more and increase participation in his own care.  Patient demonstrates fair to good rehab potential, and should benefit from continued skilled occupational therapy services while in acute care to maximize safety, independence and quality of life at home.  Continued occupational therapy service is recommended.  ?      Recommendations for follow up therapy are one component of a multi-disciplinary discharge planning process, led by the attending physician.  Recommendations may be updated based on patient status, additional functional criteria and insurance  authorization.   Assistance Recommended at Discharge Frequent or constant Supervision/Assistance  Patient can return home with the following A little help with bathing/dressing/bathroom;Help with stairs or ramp for entrance;Assist for transportation;Assistance with cooking/housework;Direct supervision/assist for financial management;Direct supervision/assist for medications management;A little help with walking and/or transfers    Functional Status Assessment  Patient has had a recent decline in their functional status and demonstrates the ability to make significant improvements in function in a reasonable and predictable amount of time.  Equipment Recommendations  Other (comment) ("Hip Kit")    Recommendations for Other Services       Precautions / Restrictions Precautions Precautions: Fall Precaution Comments: loose stool, wears depends at home Restrictions Weight Bearing Restrictions: No      Mobility Bed Mobility Overal bed mobility: Modified Independent             General bed mobility comments: HOB elevated, increased time/effort.  Pt sleep in lift recliner at baseline.    Transfers                          Balance Overall balance assessment: Needs assistance Sitting-balance support: Feet supported, Single extremity supported Sitting balance-Leahy Scale: Good     Standing balance support: Single extremity supported, During functional activity Standing balance-Leahy Scale: Fair                             ADL either performed or assessed with clinical judgement   ADL Overall ADL's : Needs assistance/impaired Eating/Feeding: Independent   Grooming: Min guard;Oral care;Standing Grooming Details (indicate cue type and reason): Pt tolerated 1 task at sink then asked to sit  in chair to rest. Upper Body Bathing: Min guard;Sitting   Lower Body Bathing: Moderate assistance;Sitting/lateral leans;Sit to/from stand   Upper Body Dressing :  Minimal assistance;Sitting   Lower Body Dressing: Maximal assistance;Sit to/from stand;Sitting/lateral leans   Toilet Transfer: Minimal assistance;Rolling walker (2 wheels);Stand-pivot Armed forces technical officer Details (indicate cue type and reason): Simulated to recliner. Stood from low EOB to [employer] with Min As Toileting- Water quality scientist and Hygiene: Moderate assistance;Sitting/lateral lean;Sit to/from stand Toileting - Clothing Manipulation Details (indicate cue type and reason): On external catheter     Functional mobility during ADLs: Minimal assistance;Min guard;Cueing for sequencing;Rolling walker (2 wheels)       Vision Baseline Vision/History: 1 Wears glasses Vision Assessment?: No apparent visual deficits     Perception     Praxis      Pertinent Vitals/Pain Pain Assessment Pain Assessment: No/denies pain     Hand Dominance Right   Extremity/Trunk Assessment Upper Extremity Assessment Upper Extremity Assessment: Overall WFL for tasks assessed   Lower Extremity Assessment Lower Extremity Assessment: Generalized weakness       Communication Communication Communication: No difficulties   Cognition Arousal/Alertness: Awake/alert Behavior During Therapy: WFL for tasks assessed/performed Overall Cognitive Status: Impaired/Different from baseline Area of Impairment: Awareness, Memory, Orientation, Problem solving                 Orientation Level: Disoriented to, Situation   Memory: Decreased short-term memory     Awareness: Emergent Problem Solving: Requires verbal cues General Comments: Oriented to all but situation. Pt initiated cell phone call to his wife to answer questions for him. Pt's wife stated, "He's been doing this. He wants me to speak for him." and expressed some concern regarding this.     General Comments       Exercises     Shoulder Instructions      Home Living Family/patient expects to be discharged to:: Private residence                    Bathroom Shower/Tub: Tub/shower unit   Bathroom Toilet: Standard     Home Equipment: Conservation officer, nature (2 wheels);Rollator (4 wheels);Wheelchair - manual;BSC/3in1   Additional Comments: Pt sleeps in his Risk analyst.  BSC atop toilet.      Prior Functioning/Environment                          OT Problem List: Decreased knowledge of use of DME or AE;Decreased activity tolerance;Impaired balance (sitting and/or standing);Decreased cognition      OT Treatment/Interventions: Self-care/ADL training;Therapeutic activities;Cognitive remediation/compensation;Patient/family education;DME and/or AE instruction;Balance training    OT Goals(Current goals can be found in the care plan section) Acute Rehab OT Goals Patient Stated Goal: Per Patient: "Get out of here". Per wife: For pt to initiate more on his own and not look to her as much. OT Goal Formulation: With patient/family Time For Goal Achievement: 06/14/22 Potential to Achieve Goals: Fair ADL Goals Pt Will Perform Grooming: standing;with supervision (Tolerating 3/3 tasks at sinl) Pt Will Perform Lower Body Bathing: with adaptive equipment;with supervision;sitting/lateral leans Pt Will Perform Lower Body Dressing: with adaptive equipment;with supervision;sit to/from stand;sitting/lateral leans (to decrease caregiver burden\) Pt Will Transfer to Toilet: ambulating;with supervision (BSC atop toilet to simulate home) Pt Will Perform Toileting - Clothing Manipulation and hygiene: with supervision;sitting/lateral leans;sit to/from stand  OT Frequency: Min 2X/week    Co-evaluation              AM-PAC  OT "6 Clicks" Daily Activity     Outcome Measure Help from another person eating meals?: None Help from another person taking care of personal grooming?: A Little Help from another person toileting, which includes using toliet, bedpan, or urinal?: A Lot Help from another person bathing (including washing,  rinsing, drying)?: A Lot Help from another person to put on and taking off regular upper body clothing?: A Little Help from another person to put on and taking off regular lower body clothing?: A Lot 6 Click Score: 16   End of Session Equipment Utilized During Treatment: Gait belt;Rolling walker (2 wheels) Nurse Communication: Other (comment) (CNA Into room to check on pt)  Activity Tolerance: Patient limited by fatigue Patient left: in chair;with chair alarm set;with call bell/phone within reach                   Time: [phone] OT Time Calculation (min): 29 min Charges:  OT General Charges $OT Visit: 1 Visit OT Evaluation $OT Eval Low Complexity: 1 Low OT Treatments $Self Care/Home Management : 8-22 mins  Anderson [place], [place] Office: [phone] 05/31/2022  Julien Girt 05/31/2022, 9:50 AM

## 2022-06-01 ENCOUNTER — Other Ambulatory Visit: Payer: Self-pay

## 2022-06-01 DIAGNOSIS — E876 Hypokalemia: Secondary | ICD-10-CM | POA: Diagnosis not present

## 2022-06-01 DIAGNOSIS — R197 Diarrhea, unspecified: Secondary | ICD-10-CM | POA: Diagnosis not present

## 2022-06-01 DIAGNOSIS — E860 Dehydration: Secondary | ICD-10-CM | POA: Diagnosis not present

## 2022-06-01 DIAGNOSIS — R001 Bradycardia, unspecified: Secondary | ICD-10-CM

## 2022-06-01 DIAGNOSIS — N179 Acute kidney failure, unspecified: Secondary | ICD-10-CM | POA: Diagnosis not present

## 2022-06-01 DIAGNOSIS — I498 Other specified cardiac arrhythmias: Secondary | ICD-10-CM

## 2022-06-01 LAB — BASIC METABOLIC PANEL
Anion gap: 6 (ref 5–15)
BUN: 15 mg/dL (ref 8–23)
CO2: 22 mmol/L (ref 22–32)
Calcium: 8.3 mg/dL — ABNORMAL LOW (ref 8.9–10.3)
Chloride: 105 mmol/L (ref 98–111)
Creatinine, Ser: 1.03 mg/dL (ref 0.61–1.24)
GFR, Estimated: >60 mL/min (ref >60)
Glucose, Bld: 91 mg/dL (ref 70–99)
Potassium: 3.8 mmol/L (ref 3.5–5.1)
Sodium: 133 mmol/L — ABNORMAL LOW (ref 135–145)

## 2022-06-01 LAB — CBC
HCT: 21.7 % — ABNORMAL LOW (ref 39.0–52.0)
Hemoglobin: 7.2 g/dL — ABNORMAL LOW (ref 13.0–17.0)
MCH: 30.8 pg (ref 26.0–34.0)
MCHC: 33.2 g/dL (ref 30.0–36.0)
MCV: 92.7 fL (ref 80.0–100.0)
Platelets: 217 10*3/uL (ref 150–400)
RBC: 2.34 MIL/uL — ABNORMAL LOW (ref 4.22–5.81)
RDW: 17.2 % — ABNORMAL HIGH (ref 11.5–15.5)
WBC: 3.9 10*3/uL — ABNORMAL LOW (ref 4.0–10.5)
nRBC: 0 % (ref 0.0–0.2)

## 2022-06-01 LAB — MAGNESIUM: Magnesium: 1.7 mg/dL (ref 1.7–2.4)

## 2022-06-01 MED ORDER — FERROUS SULFATE 325 (65 FE) MG PO TABS: 325.0000 mg | ORAL_TABLET | Freq: Two times a day (BID) | ORAL | 3 refills | Status: DC

## 2022-06-01 MED ORDER — MAGNESIUM SULFATE 2 GM/50ML IV SOLN
2.0000 g | Freq: Once | INTRAVENOUS | Status: AC
  Administered 2022-06-01: 2 g via INTRAVENOUS
  Filled 2022-06-01: qty 50

## 2022-06-01 MED ORDER — ASPIRIN 81 MG PO TBEC: 81.0000 mg | DELAYED_RELEASE_TABLET | Freq: Every day | ORAL | 12 refills | Status: DC

## 2022-06-01 MED ORDER — LOPERAMIDE HCL 2 MG PO CAPS: 2.0000 mg | ORAL_CAPSULE | ORAL | 0 refills | Status: DC | PRN

## 2022-06-01 MED ORDER — POTASSIUM CHLORIDE CRYS ER 20 MEQ PO TBCR
40.0000 meq | EXTENDED_RELEASE_TABLET | Freq: Every day | ORAL | Status: DC
  Administered 2022-06-01: 40 meq via ORAL
  Filled 2022-06-01: qty 2

## 2022-06-01 NOTE — Discharge Summary (Signed)
Physician Discharge Summary  Frank Hernandez [id number] DOB: 09-Oct-1940 DOA: 05/28/2022  PCP: [name] (Inactive)  Admit date: 05/28/2022 Discharge date: 06/01/2022  Time spent: 55 minutes  Recommendations for Outpatient Follow-up:  Follow-up with [name] (Inactive) in 1 week.  On follow-up patient will need a basic metabolic profile, CBC done to follow-up on electrolytes renal function and hemoglobin.  Patient also need a magnesium level checked.  Patient blood pressure need to be reassessed as patient's ARB HCTZ was discontinued during the hospitalization.  Patient may need another agent for blood pressure control as he had presented with acute kidney injury on CKD stage IIIa. Follow-up with Dr. Crissie Sickles, cardiology in 2 weeks for follow-up on bradyarrhythmia.   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  Diet recommendation: Heart healthy  Filed Weights   05/28/22 0845  Weight: 92.5 kg    History of present illness:  HPI per Dr. Scharlene Hernandez is a 82 y.o. male with medical history significant for remote history of bladder cancer who unfortunately had pathologic fracture of the right hip, was diagnosed with multiple myeloma, he also has a history of hypertension, CKD.  He recently started chemotherapy about 3 weeks ago, since that was started, his wife states he has been having copious watery diarrhea.  I spoke to her over the phone.  Patient was recently discharged from rehab to home, she states that he is still very weak, cannot ambulate, and states he has been home he has continued to have copious black looking watery diarrhea.   Patient and wife deny abdominal pain, nausea, vomiting, blood in the stool (though it is dark).  No fevers.   ED Course:    Patient presented to the emergency department with the above complaints, he was noted to be somewhat hypotensive blood pressure 83/49, was fluid responsive but blood pressure is now normal 114/75.  Lab work was also performed, he was noted to have normal white blood cell count, hemoglobin 8.9, platelets 194 potassium was low at 2.6, on recheck confirmed 2.5.  He has lactic acidosis 2.3, creatinine has doubled to 2.24 from his normal baseline of about 1.2.  Patient was started on IV fluids, after which his blood pressure got better.  Stool studies have been ordered.  Patient was also given IV potassium, and hospitalist was contacted for admission.  Currently the patient is on the floor, he is eating lunch, has no complaints has not had any diarrhea since he has been up to the floor.  Hospital Course:  #1 diarrhea -Patient presented 1 week history of copious watery diarrhea since being discharged from SNF. -Stool studies sent with positive C. difficile antigen, negative C. difficile toxin, -Due to presentation of multiple episodes of watery loose stools, positive C. difficile antigen, elevated lactic acid level patient started on oral vancomycin. -GI pathogen panel negative. -Toxigenic C. difficile by PCR negative. -Consistency of stools  improved. -Patient remained afebrile, normal white count, no significant abdominal pain and as such as toxigenic C. difficile by PCR was negative may likely be colonized. -Vancomycin initially started empirically was subsequently discontinued.   -Consistency of stools improved.   -  Outpatient follow-up with PCP.     2.  Severe electrolyte abnormalities/severe hypokalemia/hypomagnesemia -Secondary to GI losses -Potassium on admission noted as low as 2.5. -Electrolytes replete as such that by day of discharge sodium was approximately at 3.8,  magnesium was 1.7 and patient received 2 g of IV magnesium during the hospitalization.  -Outpatient follow-up with PCP.    3.  Acute on chronic kidney stage IIIa -Likely secondary to prerenal azotemia secondary to diarrhea in the setting of ARB/HCTZ. -Patient also noted to be hypotensive on presentation. - baseline creatinine approximately 1.5-1.7. -Patient noted on admission to have a creatinine up as high as 2.24 with last creatinine in epic system of 1.17 on 05/21/2022.   -Patient hydrated with IV fluids renal function improved.  Patient's home antihypertensive medication of ARB HCTZ was held during the hospitalization will not be resumed on discharge.   -Renal function improved such that by day of discharge creatinine was down to 1.03 by day of discharge.   -Outpatient follow-up with PCP.    4.  Dehydration -Patient hydrated with IV fluids and was euvolemic by day of discharge.    5.  Ventricular standstill/sinus pause/bradyarrhythmia -Patient noted to have a ventricular standstill/sinus pause around 5:20 AM on 05/30/2022, per RN note patient remained asymptomatic.  Unsure as to whether patient likely sleeping at that time. -Patient noted with severe electrolyte abnormalities of hypomagnesemia, hypokalemia which were corrected during the hospitalization.  -Case discussed with cardiology, Dr. Curt Bears who felt if sinus pause/ventricular standstill okay during the sleep cycle nothing further to do at this time as patient not on any AV nodal blocking agents.  Per cardiology monitor and if pulse recurs while patient awake for patient symptomatic then will need a formal cardiology consult.   -Patient patient noted on telemetry with some concern for A-fib with slow RVR/bradyarrhythmia.  -Patient with recent 2D echo 04/22/2022 with a EF of 60 to 65%, no wall motion abnormalities, normal right ventricular systolic function, grossly normal mitral valve, no evidence of AVR, no evidence of mitral  stenosis. -Due to bradyarrhythmia, cardiology consulted.  -Patient seen in consultation by Dr. Harl Bowie who reviewed telemetry strips, EKG and felt that this time does no indication for PPM.  Patient noted to be asymptomatic on his prior cardiac monitors.  Per cardiology rhythm mostly been junctional with marked first-degree AV block, no atrial fibrillation noted per cardiology no recent pauses.  -Cardiology felt patient did not need a repeat monitor at this time as he did not present with a syncopal episode and recommended electrolyte repletion with outpatient follow-up with cardiology.  -Patient was discharged in stable improved condition.  -Outpatient follow-up with cardiology, Dr. Lovena Le.   6.  Hypertension -Patient had presented with hypotension and as such antihypertensive medications held during the hospitalization and will be discontinued on discharge.   -Outpatient follow with PCP in 1 week for further evaluation and management of hypertension.     7.  Hypovolemic hypotension -Secondary to volume depletion secondary to GI losses from diarrhea in the setting of antihypertensive medications. -Patient hydrated with IV fluids and blood pressure responded.   -Antihypertensive medications were held and will be discontinued on discharge.   -Outpatient follow-up with PCP in 1 week for further evaluation of antihypertensive medications.   -Patient will be discharged in stable and improved condition.    8.  Recently diagnosed multiple myeloma -Outpatient follow-up with hematology/oncology, Dr.Kale.   9.  Normocytic anemia -Likely secondary to recently diagnosed multiple myeloma. -FOBT negative. -  Patient with no overt bleeding. -Hemoglobin stabilized at 7.2 by day of discharge.   -Outpatient follow-up with PCP and hematology/oncology.  10.  Pressure injury, POA Pressure Injury 05/28/22 Heel Right Stage 3 -  Full thickness tissue loss. Subcutaneous fat may be visible but bone, tendon or  muscle are NOT exposed. Open wound, broken from pressure & dry skin (Active)  05/28/22 1400  Location: Heel  Location Orientation: Right  Staging: Stage 3 -  Full thickness tissue loss. Subcutaneous fat may be visible but bone, tendon or muscle are NOT exposed.  Wound Description (Comments): Open wound, broken from pressure & dry skin  Present on Admission: Yes        Procedures: None  Consultations: Cardiology: Dr. Harl Bowie 06/01/2022  Discharge Exam: Vitals:   06/01/22 0513 06/01/22 1256  BP: (!) 149/71 102/67  Pulse: 80 88  Resp: 18   Temp: 99 F (37.2 C) 98.1 F (36.7 C)  SpO2: 99% 100%    General: NAD Cardiovascular: Regular rate rhythm no murmurs rubs or gallops.  No JVD.  No lower extremity edema. Respiratory: Lungs clear to auscultation bilaterally.  No wheezes, no crackles, no rhonchi.  Fair air movement.  Speaking in full sentences.  Discharge Instructions   Discharge Instructions     Diet - low sodium heart healthy   Complete by: As directed    Discharge wound care:   Complete by: As directed    Pressure Injury 03/29/22 Buttocks Right Stage 1 -  Intact skin with non-blanchable redness of a localized area usually over a bony prominence. 63 days    Pressure Injury 05/28/22 Heel Right Stage 3 -  Full thickness tissue loss. Subcutaneous fat may be visible but bone, tendon or muscle are NOT exposed. Open wound, broken from pressure & dry skin 4 days      Wound Care Orders (From admission, onward)      Start     Ordered   05/29/22 1112    Wound care  Every shift      Comments: Wound care to right heel:  Cleanse with NS, pat dry. Apply Medihoney, top with dry gauze and secure with a few turns of Kerlix roll gauze/paper tape. Place foot (feet) into Prevalon boots.   Increase activity slowly   Complete by: As directed       Allergies as of 06/01/2022   No Known Allergies      Medication List     STOP taking these medications    ferrous sulfate 325 (65  FE) MG EC tablet Replaced by: ferrous sulfate 325 (65 FE) MG tablet   multivitamins with iron Tabs tablet   oxyCODONE 5 MG immediate release tablet Commonly known as: Roxicodone   polyethylene glycol 17 g packet Commonly known as: MiraLax   valsartan-hydrochlorothiazide 160-12.5 MG tablet Commonly known as: DIOVAN-HCT       TAKE these medications    acyclovir 400 MG tablet Commonly known as: ZOVIRAX Take 1 tablet (400 mg total) by mouth 2 (two) times daily.   aspirin EC 81 MG tablet Take 1 tablet (81 mg total) by mouth daily. Swallow whole. Start taking on: June 02, 2022 What changed: when to take this   feeding supplement Liqd Take 237 mLs by mouth 2 (two) times daily between meals. What changed:  when to take this additional instructions   ferrous sulfate 325 (65 FE) MG tablet Take 1 tablet (325 mg total) by mouth 2 (two) times daily with a meal. Replaces: ferrous  sulfate 325 (65 FE) MG EC tablet   loperamide 2 MG capsule Commonly known as: IMODIUM Take 1 capsule (2 mg total) by mouth as needed for diarrhea or loose stools.   ondansetron 8 MG tablet Commonly known as: Zofran Take 8 mg by mouth 30 to 60 min prior to Cyclophosphamide administration then take 8 mg every 8 hrs as needed for nausea and vomiting.   prochlorperazine 10 MG tablet Commonly known as: COMPAZINE Take 1 tablet (10 mg total) by mouth every 6 (six) hours as needed for nausea or vomiting.   Vitamin D (Ergocalciferol) 1.25 MG (50000 UNIT) Caps capsule Commonly known as: DRISDOL Take 1 capsule (50,000 Units total) by mouth once a week.               Discharge Care Instructions  (From admission, onward)           Start     Ordered   06/01/22 0000  Discharge wound care:       Comments: Pressure Injury 03/29/22 Buttocks Right Stage 1 -  Intact skin with non-blanchable redness of a localized area usually over a bony prominence. 63 days    Pressure Injury 05/28/22 Heel Right Stage 3  -  Full thickness tissue loss. Subcutaneous fat may be visible but bone, tendon or muscle are NOT exposed. Open wound, broken from pressure & dry skin 4 days      Wound Care Orders (From admission, onward)      Start     Ordered   05/29/22 1112    Wound care  Every shift      Comments: Wound care to right heel:  Cleanse with NS, pat dry. Apply Medihoney, top with dry gauze and secure with a few turns of Kerlix roll gauze/paper tape. Place foot (feet) into Prevalon boots.   06/01/22 1630           No Known Allergies  Follow-up Information     [name]. Schedule an appointment as soon as possible for a visit in 1 week(s).   Specialty: Surgery Contact information: [place]         Evans Lance, MD. Schedule an appointment as soon as possible for a visit in 2 week(s).   Specialty: Cardiology Contact information: [id number] [place] [address] [phone]                  The results of significant diagnostics from this hospitalization (including imaging, microbiology, ancillary and laboratory) are listed below for reference.    Significant Diagnostic Studies: NM PET Image Initial (PI) Whole Body  Result Date: 05/23/2022 CLINICAL DATA:  Initial treatment strategy for multiple myeloma. EXAM: NUCLEAR MEDICINE PET WHOLE BODY TECHNIQUE: 10.1 mCi F-18 FDG was injected intravenously. Full-ring PET imaging was performed from the head to foot after the radiotracer. CT data was obtained and used for attenuation correction and anatomic localization. Fasting blood glucose: 83 mg/dl COMPARISON:  CT chest abdomen pelvis dated 04/14/2022 FINDINGS: Mediastinal blood pool activity: SUV max 2.6 HEAD/NECK: No hypermetabolic activity in the scalp. No hypermetabolic cervical lymph nodes. 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 Incidental CT findings: none CHEST: No suspicious pulmonary  nodules. No hypermetabolic thoracic lymphadenopathy. Incidental CT findings: Moderate coronary atherosclerosis of the LAD and right coronary artery. ABDOMEN/PELVIS: No abnormal hypermetabolism in the liver, spleen, pancreas, or adrenal glands. No hypermetabolic abdominopelvic lymphadenopathy. Incidental CT findings: Bilateral renal  cysts, measuring up to 3.3 cm in the left lower pole, benign (Bosniak I). No follow-up is recommended. Sigmoid diverticulosis, without evidence of diverticulitis. Moderate right inguinal/scrotal hernia containing the right anterior bladder (series 4/image 205) and loops of nondilated small bowel (series 4/image 222), without evidence of obstruction. Small fat containing left inguinal hernia. Atherosclerotic calcifications the abdominal aorta and branch vessels. SKELETON: Status post ORIF of the right hip with associated hypermetabolism. Known underlying pathologic fracture. Otherwise, no focal hypermetabolic activity to suggest skeletal metastasis or active myeloma. Specifically, the lytic lesion along the left posterior aspect of the L2 vertebral body (series 4/image 149) is non FDG avid, max SUV 1.5. The lytic lesion along the left iliac bone/acetabulum (series 4/image 195) is essentially non FDG avid, max SUV 1.9. Incidental CT findings: Status post ORIF of the left hip. Degenerative changes of the visualized thoracolumbar spine. EXTREMITIES: No abnormal hypermetabolic activity in the lower extremities. Incidental CT findings: none IMPRESSION: Status post ORIF of the right hip with known underlying pathologic fracture. Additional lytic lesions involving the L2 vertebral body and left iliac bone/acetabulum are non FDG avid. No findings suggestive of active myeloma. 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 If clinically warranted given the patient's age/comorbidities, consider ENT consultation. Additional ancillary findings as  above. Electronically Signed   By: Julian Hy M.D.   On: 05/23/2022 03:12    Microbiology: Recent Results (from the past 240 hour(s))  C. Diff by PCR, Reflexed     Status: None   Collection Time: 05/28/22 12:38 PM  Result Value Ref Range Status   Toxigenic C. Difficile by PCR NEGATIVE NEGATIVE Final    Comment: Patient is colonized with non toxigenic C. difficile. May not need treatment unless significant symptoms are present. Performed at [facility], [place] [address] [place], [place] [address]   Gastrointestinal Panel by PCR , Stool     Status: None   Collection Time: 05/28/22  4:32 PM   Specimen: Stool  Result Value Ref Range Status   Campylobacter species NOT DETECTED NOT DETECTED Final   Plesimonas shigelloides NOT DETECTED NOT DETECTED Final   Salmonella species NOT DETECTED NOT DETECTED Final   Yersinia enterocolitica NOT DETECTED NOT DETECTED Final   Vibrio species NOT DETECTED NOT DETECTED Final   Vibrio cholerae NOT DETECTED NOT DETECTED Final   Enteroaggregative E coli (EAEC) NOT DETECTED NOT DETECTED Final   Enteropathogenic E coli (EPEC) NOT DETECTED NOT DETECTED Final   Enterotoxigenic E coli (ETEC) NOT DETECTED NOT DETECTED Final   Shiga like toxin producing E coli (STEC) NOT DETECTED NOT DETECTED Final   Shigella/Enteroinvasive E coli (EIEC) NOT DETECTED NOT DETECTED Final   Cryptosporidium NOT DETECTED NOT DETECTED Final   Cyclospora cayetanensis NOT DETECTED NOT DETECTED Final   Entamoeba histolytica NOT DETECTED NOT DETECTED Final   Giardia lamblia NOT DETECTED NOT DETECTED Final   Adenovirus F40/41 NOT DETECTED NOT DETECTED Final   Astrovirus NOT DETECTED NOT DETECTED Final   Norovirus GI/GII NOT DETECTED NOT DETECTED Final   Rotavirus A NOT DETECTED NOT DETECTED Final   Sapovirus (I, II, IV, and V) NOT DETECTED NOT DETECTED Final    Comment: Performed at [facility], [address] [place], [place] [address]  C Difficile Quick Screen w  PCR reflex     Status: Abnormal   Collection Time: 05/28/22  4:33 PM   Specimen: Stool  Result Value Ref Range Status   C  Diff antigen POSITIVE (A) NEGATIVE Final   C Diff toxin NEGATIVE NEGATIVE Final   C Diff interpretation Results are indeterminate. See PCR results.  Final    Comment: Performed at [facility], [place] [address] [place], [place] [address]     Labs: Basic Metabolic Panel: Recent Labs  Lab 05/28/22 1446 05/28/22 1814 05/29/22 0406 05/30/22 0349 05/31/22 0855 06/01/22 0420  NA  --  134* 135 135 133* 133*  K  --  3.5 3.1* 4.3 3.8 3.8  CL  --  107 108 109 106 105  CO2  --  18* 21* 22 23 22   GLUCOSE  --  94 79 104* 125* 91  BUN  --  27* 25* 22 17 15   CREATININE 1.88* 1.76* 1.48* 1.25* 1.11 1.03  CALCIUM  --  8.3* 8.1* 8.4* 8.5* 8.3*  MG 1.6*  --  1.6* 2.2  --  1.7  PHOS  --   --  2.8  --   --   --    Liver Function Tests: Recent Labs  Lab 05/28/22 0720 05/28/22 0940  AST 33 36  ALT 23 24  ALKPHOS 171* 154*  BILITOT 0.9 0.9  PROT 6.3* 6.2*  ALBUMIN 3.5 3.2*   No results for input(s): "LIPASE", "AMYLASE" in the last 168 hours. No results for input(s): "AMMONIA" in the last 168 hours. CBC: Recent Labs  Lab 05/28/22 0720 05/28/22 0720 05/28/22 0940 05/28/22 1446 05/29/22 0406 05/30/22 0349 05/31/22 0855 06/01/22 0420  WBC 5.4   < > 6.0 6.6 4.7 4.3 4.6 3.9*  NEUTROABS 4.0  --  4.4  --   --  2.8 2.9  --   HGB 8.9*   < > 8.4* 8.7* 7.8* 7.3* 7.6* 7.2*  HCT 26.1*  --  26.4* 26.8* 23.7* 22.7* 23.3* 21.7*  MCV 90.3  --  96.7 96.1 93.3 96.2 94.0 92.7  PLT 194   < > 180 178 180 197 218 217   < > = values in this interval not displayed.   Cardiac Enzymes: No results for input(s): "CKTOTAL", "CKMB", "CKMBINDEX", "TROPONINI" in the last 168 hours. BNP: BNP (last 3 results) No results for input(s): "BNP" in the last 8760 hours.  ProBNP (last 3 results) No results for input(s): "PROBNP" in the last 8760 hours.  CBG: No results for  input(s): "GLUCAP" in the last 168 hours.     Signed:  Irine Seal MD.  [facility] 06/01/2022, 4:47 PM

## 2022-06-01 NOTE — Progress Notes (Signed)
Initial Nutrition Assessment  DOCUMENTATION CODES:   Not applicable  INTERVENTION:  - Regular diet.  - Ensure Plus High Protein po BID, each supplement provides 350 kcal and 20 grams of protein. - Continue iron and vitamin D supplementation as medically appropriate. - Monitor weights.   NUTRITION DIAGNOSIS:   Increased nutrient needs related to chronic illness as evidenced by estimated needs.  GOAL:   Patient will meet greater than or equal to 90% of their needs  MONITOR:   PO intake, Supplement acceptance, Weight trends  REASON FOR ASSESSMENT:   Malnutrition Screening Tool    ASSESSMENT:   82 y.o. male with PMH remote history of bladder cancer, multiple myeloma, hypertension, CKD who recently started chemotherapy (about 3 weeks PTA), and since that time has had copious amounts of watery diarrhea.  Patient reports he is unsure of a UBW but feels he has likely lost some weight over the past few weeks since being at rehab.  Per EMR, patient weighed at 226# at the beginning of February and dropped to 207# by the middle of February. This would be a 19# or 8% weight loss in less than 1 month, which is significant. However, weight appears stable since that time.   Patient admits he has only been eating 1 meal a day recently as he has not liked the food at rehab. Doesn't like the food in the hospital either. Consuming an average of 50% of meals since admit. Notes appetite is good just doesn't like the food.  Thankfully, he has been receiving Ensure and likes it. Has been drinking twice daily. Encouraged patient to continue to consume supplements to aid in meeting nutritional needs.    Medications reviewed and include: 325mg  iron, 50000 units vitamin D weekly  Labs reviewed:  Na 133 HA1C 6.0   NUTRITION - FOCUSED PHYSICAL EXAM:  Deferred  Diet Order:   Diet Order             Diet regular Room service appropriate? Yes; Fluid consistency: Thin  Diet effective now                    EDUCATION NEEDS:  Education needs have been addressed  Skin:  Skin Assessment: Skin Integrity Issues: Skin Integrity Issues:: Stage III Stage III: L heel  Last BM:  4/1  Height:  Ht Readings from Last 1 Encounters:  05/28/22 5' 9.5" (1.765 m)   Weight:  Wt Readings from Last 1 Encounters:  05/28/22 92.5 kg    BMI:  Body mass index is 29.69 kg/m.  Estimated Nutritional Needs:  Kcal:  1950-2100 kcals Protein:  90-110 grams Fluid:  >/= 1.9L    Samson Frederic RD, LDN For contact information, refer to [facility].

## 2022-06-01 NOTE — Consult Note (Signed)
Cardiology Consultation   Patient ID: Frank Hernandez MRN: [id number]; DOB: 1940-06-27  Admit date: 05/28/2022 Date of Consult: 06/01/2022  PCP:  [name] (Inactive)   [place] Providers Cardiologist:  None        Patient Profile:   Frank Hernandez is a 82 y.o. male with a hx of bladder cancer, multiple myeloma (diagnosed s/p pathologic fracture of hip), hypertension, CKD who is being seen 06/01/2022 for the evaluation of bradyarrhythmia at the request of Dr. Grandville Silos.  History of Present Illness:   Mr. Suite presented to the ED on 3/29 with symptoms of significant diarrhea. Of note, patient started chemotherapy for multiple myeloma 3 weeks ago and this is when diarrhea began. Patient was found to be hypotensive though this improved with fluids. There was concern for C.dif but GI PCR panel negative. Patient with multiple electrolyte derangements this admission. Mg remains low at 1.7 this morning, K at 3.8.  Early in the morning on 3/31, patient was noted to have a pause on his telemetry.  This prompted patient's primary team to reach out to Dr. Curt Bears who advised that patient continued to be monitored and that sinus pause/ventricular standstill during sleep would not require further inpatient evaluation.  On subsequent evaluation of telemetry, patient's primary team felt that patient was demonstrating new onset atrial fibrillation with slow ventricular response and reached out to cardiology for formal consult.  Patient has been seen by Dr. Acie Fredrickson in the past following an episode of near syncope.  Given his syncope, a 30-day event monitor was placed, though it appears the patient's episode of passing out was associated with taking his antihypertensive medications and not eating breakfast.  30-day monitor noted pauses of 3 to 4 seconds, mostly at night, associated with heart rate in the 30s.  Patient was also seen by Dr. Lovena Le with EP and it was felt that his near  syncopal episodes were not likely to be bradycardia mediated.  Today patient tells me that he has not felt symptoms of lightheadedness or dizziness recently.  He also denies palpitations and racing heartbeat.  Patient denies chest pain and shortness of breath or significant changes to exertional tolerance.    Past Medical History:  Diagnosis Date   Bleeding from the nose    Hypertension    Pre-diabetes     Past Surgical History:  Procedure Laterality Date   COLONOSCOPY     INTRAMEDULLARY (IM) NAIL INTERTROCHANTERIC Left 06/11/2020   Procedure: INTRAMEDULLARY (IM) NAIL INTERTROCHANTRIC;  Surgeon: Shona Needles, MD;  Location: [facility];  Service: Orthopedics;  Laterality: Left;   INTRAMEDULLARY (IM) NAIL INTERTROCHANTERIC Right 04/17/2022   Procedure: INTRAMEDULLARY (IM) NAIL INTERTROCHANTERIC;  Surgeon: Callie Fielding, MD;  Location: [facility];  Service: Orthopedics;  Laterality: Right;   KNEE ARTHROSCOPY Bilateral    POLYPECTOMY       Home Medications:  Prior to Admission medications   Medication Sig Start Date End Date Taking? Authorizing Provider  acyclovir (ZOVIRAX) 400 MG tablet Take 1 tablet (400 mg total) by mouth 2 (two) times daily. 05/24/22  Yes Brunetta Genera, MD  ondansetron (ZOFRAN) 8 MG tablet Take 8 mg by mouth 30 to 60 min prior to Cyclophosphamide administration then take 8 mg every 8 hrs as needed for nausea and vomiting. 05/24/22  Yes Brunetta Genera, MD  prochlorperazine (COMPAZINE) 10 MG tablet Take 1 tablet (10 mg total) by mouth every 6 (six) hours as needed for nausea or vomiting. 05/24/22  Yes Brunetta Genera, MD  valsartan-hydrochlorothiazide (DIOVAN-HCT) 160-12.5 MG tablet Take 1 tablet by mouth daily.   Yes [provider]  feeding supplement (ENSURE ENLIVE / ENSURE PLUS) LIQD Take 237 mLs by mouth 2 (two) times daily between meals. Patient taking differently: Take 237 mLs by mouth daily. May use Boost 04/03/22   Mercy Riding, MD  ferrous  sulfate 325 (65 FE) MG EC tablet Take 1 tablet (325 mg total) by mouth 2 (two) times daily. Patient not taking: Reported on 05/30/2022 04/03/22 09/30/22  Mercy Riding, MD  Multiple Vitamins-Iron (MULTIVITAMINS WITH IRON) TABS tablet Take 1 tablet by mouth daily. Patient not taking: Reported on 05/30/2022 04/04/22   Mercy Riding, MD  oxyCODONE (ROXICODONE) 5 MG immediate release tablet Take 1-2 tablets (5-10 mg total) by mouth every 4 (four) hours as needed for severe pain or moderate pain. Patient not taking: Reported on 05/30/2022 04/19/22   Callie Fielding, MD  polyethylene glycol (MIRALAX) 17 g packet Take 17 g by mouth daily. Patient not taking: Reported on 05/30/2022 04/19/22   Callie Fielding, MD  Vitamin D, Ergocalciferol, (DRISDOL) 1.25 MG (50000 UNIT) CAPS capsule Take 1 capsule (50,000 Units total) by mouth once a week. Patient not taking: Reported on 05/30/2022 04/26/22   Shelly Coss, MD    Inpatient Medications: Scheduled Meds:  acyclovir  400 mg Oral BID   aspirin EC  81 mg Oral Daily   doxazosin  2 mg Oral QPM   feeding supplement  237 mL Oral BID BM   ferrous sulfate  325 mg Oral BID WC   heparin  5,000 Units Subcutaneous Q8H   leptospermum manuka honey  1 Application Topical Daily   Vitamin D (Ergocalciferol)  50,000 Units Oral Weekly   Continuous Infusions:  PRN Meds: acetaminophen **OR** acetaminophen, lip balm, loperamide, ondansetron **OR** ondansetron (ZOFRAN) IV, oxyCODONE, traZODone  Allergies:   No Known Allergies  Social History:   Social History   Socioeconomic History   Marital status: Single    Spouse name: Not on file   Number of children: Not on file   Years of education: Not on file   Highest education level: Not on file  Occupational History   Not on file  Tobacco Use   Smoking status: Never   Smokeless tobacco: Never  Vaping Use   Vaping Use: Never used  Substance and Sexual Activity   Alcohol use: No    Alcohol/week: 0.0 standard drinks of  alcohol   Drug use: No   Sexual activity: Not on file  Other Topics Concern   Not on file  Social History Narrative   Not on file   Social Determinants of Health   Financial Resource Strain: Not on file  Food Insecurity: Food Insecurity Present (05/28/2022)   Hunger Vital Sign    Worried About Running Out of Food in the Last Year: Never true    Ran Out of Food in the Last Year: Sometimes true  Transportation Needs: Unmet Transportation Needs (05/28/2022)   PRAPARE - Hydrologist (Medical): Yes    Lack of Transportation (Non-Medical): Yes  Physical Activity: Not on file  Stress: Not on file  Social Connections: Not on file  Intimate Partner Violence: Not At Risk (05/28/2022)   Humiliation, Afraid, Rape, and Kick questionnaire    Fear of Current or Ex-Partner: No    Emotionally Abused: No    Physically Abused: No    Sexually Abused:  No    Family History:    Family History  Problem Relation Age of Onset   Colon cancer Neg Hx      ROS:  Please see the history of present illness.   All other ROS reviewed and negative.     Physical Exam/Data:   Vitals:   05/31/22 0938 05/31/22 1316 05/31/22 1748 06/01/22 0513  BP:  105/65 (!) 146/76 (!) 149/71  Pulse: 85 70  80  Resp:  20  18  Temp:  99.3 F (37.4 C)  99 F (37.2 C)  TempSrc:  Oral  Oral  SpO2: 100% 99%  99%  Weight:      Height:        Intake/Output Summary (Last 24 hours) at 06/01/2022 0846 Last data filed at 05/31/2022 2100 Gross per 24 hour  Intake 720 ml  Output 1000 ml  Net -280 ml      05/28/2022    8:45 AM 05/07/2022   10:00 AM 04/17/2022    1:18 PM  Last 3 Weights  Weight (lbs) 204 lb 204 lb 12 oz 207 lb 3.7 oz  Weight (kg) 92.534 kg 92.874 kg 94 kg     Body mass index is 29.69 kg/m.  General:  Well nourished, well developed, in no acute distress HEENT: normal Neck: no JVD Vascular: No carotid bruits; Distal pulses 2+ bilaterally Cardiac:  normal S1, S2; RRR; no murmur   Lungs:  clear to auscultation bilaterally, no wheezing, rhonchi or rales  Abd: soft, nontender, no hepatomegaly  Ext: no edema Musculoskeletal:  No deformities, BUE and BLE strength normal and equal Skin: warm and dry  Neuro:  CNs 2-12 intact, no focal abnormalities noted Psych:  Normal affect   EKG:  The EKG was personally reviewed and demonstrates: EKG today with sinus rhythm, what appears to be marked first-degree AV block       Telemetry:  Telemetry was personally reviewed and demonstrates: Telemetry is most convincing for a first-degree AV block with sinus arrhythmia.  I am not convinced that this is atrial fibrillation.  Occasional pauses are noted on telemetry.  Relevant CV Studies:  04/22/22 TTE   1. Left ventricular ejection fraction, by estimation, is 60 to 65%. The  left ventricle has normal function. The left ventricle has no regional  wall motion abnormalities. Left ventricular diastolic parameters are  indeterminate.   2. Right ventricular systolic function is normal. The right ventricular  size is normal.   3. The mitral valve is grossly normal. No evidence of mitral valve  regurgitation. No evidence of mitral stenosis.   4. The aortic valve was not well visualized. Aortic valve regurgitation  is mild. Aortic valve sclerosis is present, with no evidence of aortic  valve stenosis.   5. IVC not well visualized.   FINDINGS   Left Ventricle: Left ventricular ejection fraction, by estimation, is 60  to 65%. The left ventricle has normal function. The left ventricle has no  regional wall motion abnormalities. Definity contrast agent was given IV  to delineate the left ventricular   endocardial borders. 3D left ventricular ejection fraction analysis  performed but not reported based on interpreter judgement due to  suboptimal tracking. The left ventricular internal cavity size was normal  in size. There is no left ventricular  hypertrophy. Left ventricular  diastolic parameters are indeterminate.   Right Ventricle: The right ventricular size is normal. No increase in  right ventricular wall thickness. Right ventricular systolic function is  normal.  Left Atrium: Left atrial size was normal in size.   Right Atrium: Right atrial size was normal in size.   Pericardium: There is no evidence of pericardial effusion.   Mitral Valve: The mitral valve is grossly normal. No evidence of mitral  valve regurgitation. No evidence of mitral valve stenosis.   Tricuspid Valve: The tricuspid valve is normal in structure. Tricuspid  valve regurgitation is trivial. No evidence of tricuspid stenosis.   Aortic Valve: The aortic valve was not well visualized. Aortic valve  regurgitation is mild. Aortic regurgitation PHT measures 632 msec. Aortic  valve sclerosis is present, with no evidence of aortic valve stenosis.  Aortic valve mean gradient measures 4.5  mmHg. Aortic valve peak gradient measures 8.7 mmHg. Aortic valve area, by  VTI measures 2.69 cm.   Pulmonic Valve: The pulmonic valve was normal in structure. Pulmonic valve  regurgitation is not visualized. No evidence of pulmonic stenosis.   Aorta: The aortic root is normal in size and structure.   Venous: The inferior vena cava was not well visualized.   IAS/Shunts: No atrial level shunt detected by color flow Doppler.   Laboratory Data:  High Sensitivity Troponin:  No results for input(s): "TROPONINIHS" in the last 720 hours.   Chemistry Recent Labs  Lab 05/29/22 0406 05/30/22 0349 05/31/22 0855 06/01/22 0420  NA 135 135 133* 133*  K 3.1* 4.3 3.8 3.8  CL 108 109 106 105  CO2 21* 22 23 22   GLUCOSE 79 104* 125* 91  BUN 25* 22 17 15   CREATININE 1.48* 1.25* 1.11 1.03  CALCIUM 8.1* 8.4* 8.5* 8.3*  MG 1.6* 2.2  --  1.7  GFRNONAA 47* 58* >60 >60  ANIONGAP 6 4* 4* 6    Recent Labs  Lab 05/28/22 0720 05/28/22 0940  PROT 6.3* 6.2*  ALBUMIN 3.5 3.2*  AST 33 36  ALT 23 24  ALKPHOS  171* 154*  BILITOT 0.9 0.9   Lipids No results for input(s): "CHOL", "TRIG", "HDL", "LABVLDL", "LDLCALC", "CHOLHDL" in the last 168 hours.  Hematology Recent Labs  Lab 05/30/22 0349 05/31/22 0855 06/01/22 0420  WBC 4.3 4.6 3.9*  RBC 2.36* 2.48* 2.34*  HGB 7.3* 7.6* 7.2*  HCT 22.7* 23.3* 21.7*  MCV 96.2 94.0 92.7  MCH 30.9 30.6 30.8  MCHC 32.2 32.6 33.2  RDW 17.2* 17.4* 17.2*  PLT 197 218 217   Thyroid No results for input(s): "TSH", "FREET4" in the last 168 hours.  BNPNo results for input(s): "BNP", "PROBNP" in the last 168 hours.  DDimer No results for input(s): "DDIMER" in the last 168 hours.   Radiology/Studies:  No results found.   Assessment and Plan:   [place] [facility]  Cardiology consulted due to concern of bradycardia arrhythmia, pauses overnight on telemetry.  I have personally reviewed patient's telemetry and EKGs.  Overall his rhythm appears to be consistent with that previously seen on 2018 30-day event monitor.  Appears to eminently be a sinus rhythm with marked first-degree AV block and intermittent pauses.    As patient is not currently symptomatic, would favor more conservative approach.  Will plan to place a 14-day static heart monitor, though will review static versus life with Dr. Harl Bowie).  Avoid AV nodal blocking agents. At this does not appear to be atrial fibrillation, would not anticoagulate.  Additionally with multiple myeloma patient has anemia down to 7.2 Maintain potassium greater than 4, magnesium greater than 2  Hypertension  Antihypertensive agents initially held due to presumed dehydration induced hypotension.  BP now appears to be recovered.  Would resume home agents.  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

## 2022-06-02 LAB — MULTIPLE MYELOMA PANEL, SERUM
Albumin SerPl Elph-Mcnc: 3.2 g/dL (ref 2.9–4.4)
Albumin/Glob SerPl: 1.2 (ref 0.7–1.7)
Alpha 1: 0.3 g/dL (ref 0.0–0.4)
Alpha2 Glob SerPl Elph-Mcnc: 0.9 g/dL (ref 0.4–1.0)
B-Globulin SerPl Elph-Mcnc: 0.6 g/dL — ABNORMAL LOW (ref 0.7–1.3)
Gamma Glob SerPl Elph-Mcnc: 0.9 g/dL (ref 0.4–1.8)
Globulin, Total: 2.7 g/dL (ref 2.2–3.9)
IgA: 19 mg/dL — ABNORMAL LOW (ref 61–437)
IgG (Immunoglobin G), Serum: 1044 mg/dL (ref 603–1613)
IgM (Immunoglobulin M), Srm: 13 mg/dL — ABNORMAL LOW (ref 15–143)
M Protein SerPl Elph-Mcnc: 0.6 g/dL — ABNORMAL HIGH
Total Protein ELP: 5.9 g/dL — ABNORMAL LOW (ref 6.0–8.5)

## 2022-06-04 ENCOUNTER — Inpatient Hospital Stay (HOSPITAL_BASED_OUTPATIENT_CLINIC_OR_DEPARTMENT_OTHER): Payer: Medicare PPO | Admitting: Physician Assistant

## 2022-06-04 ENCOUNTER — Ambulatory Visit (INDEPENDENT_AMBULATORY_CARE_PROVIDER_SITE_OTHER): Payer: Medicare PPO

## 2022-06-04 ENCOUNTER — Inpatient Hospital Stay: Payer: Medicare PPO | Attending: Hematology

## 2022-06-04 ENCOUNTER — Other Ambulatory Visit: Payer: Self-pay

## 2022-06-04 ENCOUNTER — Ambulatory Visit (INDEPENDENT_AMBULATORY_CARE_PROVIDER_SITE_OTHER): Payer: Medicare PPO | Admitting: Orthopedic Surgery

## 2022-06-04 ENCOUNTER — Inpatient Hospital Stay: Payer: Medicare PPO

## 2022-06-04 VITALS — BP 105/56 | HR 96 | Temp 97.5°F | Resp 18 | Wt 219.1 lb

## 2022-06-04 DIAGNOSIS — R4781 Slurred speech: Secondary | ICD-10-CM | POA: Diagnosis not present

## 2022-06-04 DIAGNOSIS — Z5112 Encounter for antineoplastic immunotherapy: Secondary | ICD-10-CM | POA: Insufficient documentation

## 2022-06-04 DIAGNOSIS — Z7189 Other specified counseling: Secondary | ICD-10-CM

## 2022-06-04 DIAGNOSIS — Z79899 Other long term (current) drug therapy: Secondary | ICD-10-CM | POA: Insufficient documentation

## 2022-06-04 DIAGNOSIS — M25551 Pain in right hip: Secondary | ICD-10-CM

## 2022-06-04 DIAGNOSIS — N183 Chronic kidney disease, stage 3 unspecified: Secondary | ICD-10-CM | POA: Insufficient documentation

## 2022-06-04 DIAGNOSIS — E878 Other disorders of electrolyte and fluid balance, not elsewhere classified: Secondary | ICD-10-CM | POA: Insufficient documentation

## 2022-06-04 DIAGNOSIS — Z79624 Long term (current) use of inhibitors of nucleotide synthesis: Secondary | ICD-10-CM | POA: Insufficient documentation

## 2022-06-04 DIAGNOSIS — C9000 Multiple myeloma not having achieved remission: Secondary | ICD-10-CM | POA: Insufficient documentation

## 2022-06-04 DIAGNOSIS — E8581 Light chain (AL) amyloidosis: Secondary | ICD-10-CM | POA: Diagnosis not present

## 2022-06-04 DIAGNOSIS — D649 Anemia, unspecified: Secondary | ICD-10-CM | POA: Diagnosis not present

## 2022-06-04 LAB — MAGNESIUM: Magnesium: 1.6 mg/dL — ABNORMAL LOW (ref 1.7–2.4)

## 2022-06-04 LAB — CBC WITH DIFFERENTIAL (CANCER CENTER ONLY)
Abs Immature Granulocytes: 0.01 10*3/uL (ref 0.00–0.07)
Basophils Absolute: 0 10*3/uL (ref 0.0–0.1)
Basophils Relative: 0 %
Eosinophils Absolute: 0.1 10*3/uL (ref 0.0–0.5)
Eosinophils Relative: 3 %
HCT: 23.7 % — ABNORMAL LOW (ref 39.0–52.0)
Hemoglobin: 8.1 g/dL — ABNORMAL LOW (ref 13.0–17.0)
Immature Granulocytes: 0 %
Lymphocytes Relative: 12 %
Lymphs Abs: 0.6 10*3/uL — ABNORMAL LOW (ref 0.7–4.0)
MCH: 31.3 pg (ref 26.0–34.0)
MCHC: 34.2 g/dL (ref 30.0–36.0)
MCV: 91.5 fL (ref 80.0–100.0)
Monocytes Absolute: 1.1 10*3/uL — ABNORMAL HIGH (ref 0.1–1.0)
Monocytes Relative: 22 %
Neutro Abs: 3.1 10*3/uL (ref 1.7–7.7)
Neutrophils Relative %: 63 %
Platelet Count: 273 10*3/uL (ref 150–400)
RBC: 2.59 MIL/uL — ABNORMAL LOW (ref 4.22–5.81)
RDW: 17.2 % — ABNORMAL HIGH (ref 11.5–15.5)
WBC Count: 4.9 10*3/uL (ref 4.0–10.5)
nRBC: 0 % (ref 0.0–0.2)

## 2022-06-04 LAB — CMP (CANCER CENTER ONLY)
ALT: 14 U/L (ref 0–44)
AST: 27 U/L (ref 15–41)
Albumin: 3.4 g/dL — ABNORMAL LOW (ref 3.5–5.0)
Alkaline Phosphatase: 156 U/L — ABNORMAL HIGH (ref 38–126)
Anion gap: 8 (ref 5–15)
BUN: 13 mg/dL (ref 8–23)
CO2: 23 mmol/L (ref 22–32)
Calcium: 9.3 mg/dL (ref 8.9–10.3)
Chloride: 101 mmol/L (ref 98–111)
Creatinine: 1.26 mg/dL — ABNORMAL HIGH (ref 0.61–1.24)
GFR, Estimated: 57 mL/min — ABNORMAL LOW (ref >60)
Glucose, Bld: 82 mg/dL (ref 70–99)
Potassium: 4.7 mmol/L (ref 3.5–5.1)
Sodium: 132 mmol/L — ABNORMAL LOW (ref 135–145)
Total Bilirubin: 0.7 mg/dL (ref 0.3–1.2)
Total Protein: 6.1 g/dL — ABNORMAL LOW (ref 6.5–8.1)

## 2022-06-04 LAB — SAMPLE TO BLOOD BANK

## 2022-06-04 MED ORDER — SODIUM CHLORIDE 0.9 % IV SOLN: INTRAVENOUS | Status: AC

## 2022-06-04 MED ORDER — MAGNESIUM SULFATE 2 GM/50ML IV SOLN
2.0000 g | Freq: Once | INTRAVENOUS | Status: AC
  Administered 2022-06-04: 2 g via INTRAVENOUS
  Filled 2022-06-04: qty 50

## 2022-06-04 NOTE — Progress Notes (Signed)
Orthopedic Surgery Post-operative Office Visit   Procedure: Right pathological subtrochanteric femur fracture, biopsy right proximal femur Date of Surgery: 04/17/2022 (~6 weeks post-op)   Assessment: Patient is a 82 y.o. who is recovering well from his cephalomedullary rodding but has had complications with chemotherapy     Plan: -Weight bear as tolerated right lower extremity -Continue with chemotherapy -Okay to submerge wound at this point -Pain management: OTC medications -Return to office in 6 weeks, x-rays needed at next visit: right femur AP/lateral   ___________________________________________________________________________     Subjective: Since her last visit, he was admitted to hospitalist service for diarrhea with electrolyte abnormalities.  He was diagnosed with C. difficile.  Has been treated with antibiotics and Imodium.  His diarrhea is attributed to his round of chemotherapy.  He has been feeling tired because of his chemotherapy and has not been doing much.  He is now at home.  He is able to ambulate around the house with a walker.  He is not having any hip or right leg pain.  Denies paresthesias and numbness.  Has not noticed any redness or drainage from his incisions.   Objective:   General: no acute distress, appropriate affect Neurologic: alert, answering questions appropriately, following commands Respiratory: unlabored breathing on room air Skin: incision well-healed with no erythema, induration, active/expressible drainage   MSK (RLE):   -Was able to walk the steps without his walker -Knee swelling resolved -EHL/TA/GSC intact -Sensation intact to light touch in sural/saphenous/deep peroneal/superficial peroneal/tibial nerve distributions -Foot warm and well perfused, DP pulse palpable     Imaging: X-rays of the right femur taken/06/2022 were independently reviewed and interpreted, showing lag screws well centered within the femoral head.  Interval callus  formation seen laterally.  Fracture alignment maintained.  No lucency around the screws. No new fracture seen.     Patient name: Frank Hernandez Patient MRN: [id number] Date of visit: 06/04/22

## 2022-06-05 ENCOUNTER — Other Ambulatory Visit: Payer: Self-pay

## 2022-06-05 DIAGNOSIS — C7951 Secondary malignant neoplasm of bone: Secondary | ICD-10-CM | POA: Diagnosis not present

## 2022-06-05 DIAGNOSIS — N1831 Chronic kidney disease, stage 3a: Secondary | ICD-10-CM | POA: Diagnosis not present

## 2022-06-05 DIAGNOSIS — L89613 Pressure ulcer of right heel, stage 3: Secondary | ICD-10-CM | POA: Diagnosis not present

## 2022-06-05 DIAGNOSIS — I129 Hypertensive chronic kidney disease with stage 1 through stage 4 chronic kidney disease, or unspecified chronic kidney disease: Secondary | ICD-10-CM | POA: Diagnosis not present

## 2022-06-05 DIAGNOSIS — C9000 Multiple myeloma not having achieved remission: Secondary | ICD-10-CM | POA: Diagnosis not present

## 2022-06-05 DIAGNOSIS — I959 Hypotension, unspecified: Secondary | ICD-10-CM | POA: Diagnosis not present

## 2022-06-05 DIAGNOSIS — C679 Malignant neoplasm of bladder, unspecified: Secondary | ICD-10-CM | POA: Diagnosis not present

## 2022-06-05 DIAGNOSIS — M84551D Pathological fracture in neoplastic disease, right femur, subsequent encounter for fracture with routine healing: Secondary | ICD-10-CM | POA: Diagnosis not present

## 2022-06-05 DIAGNOSIS — L89322 Pressure ulcer of left buttock, stage 2: Secondary | ICD-10-CM | POA: Diagnosis not present

## 2022-06-07 ENCOUNTER — Encounter: Payer: Self-pay | Admitting: Hematology

## 2022-06-07 LAB — KAPPA/LAMBDA LIGHT CHAINS
Kappa free light chain: 19 mg/L (ref 3.3–19.4)
Kappa, lambda light chain ratio: 1.46 (ref 0.26–1.65)
Lambda free light chains: 13 mg/L (ref 5.7–26.3)

## 2022-06-07 NOTE — Progress Notes (Signed)
[facility] OFFICE PROGRESS NOTE  Frank Hernandez (Inactive) [place]  DIAGNOSIS: Evaluation and management of multiple myeloma   CURRENT THERAPY: Dara/Velcade/Dex.   INTERVAL HISTORY: Frank Hernandez 82 y.o. male returns to the clinic today for a follow-up visit accompanied by his wife.  The patient is followed by Dr. Candise Che for multiple myeloma.  He was last seen by Cassie Heilingoetter PA-C on 05/28/2022. In the interim, he was hospitalized from 05/28/2022-06/01/2022 due to diarrhea, severe electrolyte abnormalities, AKI and dehydration.  He presents today for a hospital follow up.   Mr. Shillingburg reports his diarrhea has resolved with formed stools. He is compliant with taking antidiarrheals as needed. He reports his energy levels are slowly improving since hospital discharge. His appetite is fair and is trying to supplement his diet with two protein shakes per day. He denies nausea, vomiting or abdominal pain. He denies easy bruising or signs of bleeding. He denies fevers, chills, sweats, shortness of breath, chest pain or cough. He has no other complaints.    MEDICAL HISTORY: Past Medical History:  Diagnosis Date   Bleeding from the nose    Hypertension    Pre-diabetes     ALLERGIES:  has No Known Allergies.  MEDICATIONS:  Current Outpatient Medications  Medication Sig Dispense Refill   acyclovir (ZOVIRAX) 400 MG tablet Take 1 tablet (400 mg total) by mouth 2 (two) times daily. 60 tablet 5   aspirin EC 81 MG tablet Take 1 tablet (81 mg total) by mouth daily. Swallow whole. 30 tablet 12   feeding supplement (ENSURE ENLIVE / ENSURE PLUS) LIQD Take 237 mLs by mouth 2 (two) times daily between meals. (Patient taking differently: Take 237 mLs by mouth daily. May use Boost) 237 mL 12   ferrous sulfate 325 (65 FE) MG tablet Take 1 tablet (325 mg total) by mouth 2 (two) times daily with a meal.  3   loperamide (IMODIUM) 2 MG capsule Take 1 capsule (2 mg total) by mouth as needed  for diarrhea or loose stools. 30 capsule 0   ondansetron (ZOFRAN) 8 MG tablet Take 8 mg by mouth 30 to 60 min prior to Cyclophosphamide administration then take 8 mg every 8 hrs as needed for nausea and vomiting. 30 tablet 1   prochlorperazine (COMPAZINE) 10 MG tablet Take 1 tablet (10 mg total) by mouth every 6 (six) hours as needed for nausea or vomiting. 30 tablet 1   Vitamin D, Ergocalciferol, (DRISDOL) 1.25 MG (50000 UNIT) CAPS capsule Take 1 capsule (50,000 Units total) by mouth once a week. 5 capsule 0   No current facility-administered medications for this visit.    SURGICAL HISTORY:  Past Surgical History:  Procedure Laterality Date   COLONOSCOPY     INTRAMEDULLARY (IM) NAIL INTERTROCHANTERIC Left 06/11/2020   Procedure: INTRAMEDULLARY (IM) NAIL INTERTROCHANTRIC;  Surgeon: Roby Lofts, MD;  Location: [facility] OR;  Service: Orthopedics;  Laterality: Left;   INTRAMEDULLARY (IM) NAIL INTERTROCHANTERIC Right 04/17/2022   Procedure: INTRAMEDULLARY (IM) NAIL INTERTROCHANTERIC;  Surgeon: London Sheer, MD;  Location: [facility] ORS;  Service: Orthopedics;  Laterality: Right;   KNEE ARTHROSCOPY Bilateral    POLYPECTOMY      REVIEW OF SYSTEMS:   Review of Systems  Constitutional: Positive for weakness and decreased appetite. Negative for chills, fever and unexpected weight change.  HENT: Negative for mouth sores, nosebleeds, sore throat and trouble swallowing.   Eyes: Negative for eye problems and icterus.  Respiratory: Negative for cough, hemoptysis, shortness of breath  and wheezing.   Cardiovascular: Negative for chest pain and leg swelling.  Gastrointestinal: Positive for diarrhea.  Negative for abdominal pain, constipation,  nausea and vomiting.  Genitourinary: Negative for bladder incontinence, difficulty urinating, dysuria, frequency and hematuria.   Musculoskeletal: Negative for back pain, gait problem, neck pain and neck stiffness.  Skin: Negative for itching and rash.  Neurological:  Negative for dizziness, extremity weakness, gait problem, headaches, light-headedness and seizures.  Hematological: Negative for adenopathy. Does not bruise/bleed easily.  Psychiatric/Behavioral: Positive for AMS. Negative for depression and sleep disturbance. The patient is not nervous/anxious.     PHYSICAL EXAMINATION:  Blood pressure (!) 105/56, pulse 96, temperature (!) 97.5 F (36.4 C), resp. rate 18, weight 219 lb 1.6 oz (99.4 kg), SpO2 100 %.  ECOG PERFORMANCE STATUS: 3  Physical Exam  Constitutional: Oriented to person and place.  Believes the year is 32 and the month is February. and well-developed, well-nourished, and in no distress.  HENT:  Head: Normocephalic and atraumatic.  Mouth/Throat: Oropharynx is clear and moist. No oropharyngeal exudate.  Eyes: Conjunctivae are normal. Right eye exhibits no discharge. Left eye exhibits no discharge. No scleral icterus.  Neck: Normal range of motion. Neck supple.  Cardiovascular: Normal rate, regular rhythm, normal heart sounds and intact distal pulses.   Pulmonary/Chest: Effort normal and breath sounds normal. No respiratory distress. No wheezes. No rales.  Abdominal: Soft. Bowel sounds are normal. Exhibits no distension and no mass. There is no tenderness.  Musculoskeletal: Normal range of motion. Exhibits no edema.  Lymphadenopathy:    No cervical adenopathy.  Neurological: Alert and oriented to person and place.  He is not oriented to time.  Exhibits muscle wasting.  He was examined in the wheelchair.  Skin: Skin is warm and dry. No rash noted. Not diaphoretic. No erythema. No pallor.  Psychiatric: Mood.  The patient was giggling in the room randomly.  The patient is oriented to person but believes the year is 6. Vitals reviewed.  LABORATORY DATA: Lab Results  Component Value Date   WBC 4.9 06/04/2022   HGB 8.1 (L) 06/04/2022   HCT 23.7 (L) 06/04/2022   MCV 91.5 06/04/2022   PLT 273 06/04/2022      Chemistry       Component Value Date/Time   NA 132 (L) 06/04/2022 1312   K 4.7 06/04/2022 1312   CL 101 06/04/2022 1312   CO2 23 06/04/2022 1312   BUN 13 06/04/2022 1312   CREATININE 1.26 (H) 06/04/2022 1312      Component Value Date/Time   CALCIUM 9.3 06/04/2022 1312   ALKPHOS 156 (H) 06/04/2022 1312   AST 27 06/04/2022 1312   ALT 14 06/04/2022 1312   BILITOT 0.7 06/04/2022 1312       RADIOGRAPHIC STUDIES:  NM PET Image Initial (PI) Whole Body  Result Date: 05/23/2022 CLINICAL DATA:  Initial treatment strategy for multiple myeloma. EXAM: NUCLEAR MEDICINE PET WHOLE BODY TECHNIQUE: 10.1 mCi F-18 FDG was injected intravenously. Full-ring PET imaging was performed from the head to foot after the radiotracer. CT data was obtained and used for attenuation correction and anatomic localization. Fasting blood glucose: 83 mg/dl COMPARISON:  CT chest abdomen pelvis dated 04/14/2022 FINDINGS: Mediastinal blood pool activity: SUV max 2.6 HEAD/NECK: No hypermetabolic activity in the scalp. No hypermetabolic cervical lymph nodes. 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 Incidental CT findings: none CHEST: No suspicious pulmonary nodules. No hypermetabolic thoracic lymphadenopathy.  Incidental CT findings: Moderate coronary atherosclerosis of the LAD and right coronary artery. ABDOMEN/PELVIS: No abnormal hypermetabolism in the liver, spleen, pancreas, or adrenal glands. No hypermetabolic abdominopelvic lymphadenopathy. Incidental CT findings: Bilateral renal cysts, measuring up to 3.3 cm in the left lower pole, benign (Bosniak I). No follow-up is recommended. Sigmoid diverticulosis, without evidence of diverticulitis. Moderate right inguinal/scrotal hernia containing the right anterior bladder (series 4/image 205) and loops of nondilated small bowel (series 4/image 222), without evidence of obstruction. Small fat containing left inguinal hernia.  Atherosclerotic calcifications the abdominal aorta and branch vessels. SKELETON: Status post ORIF of the right hip with associated hypermetabolism. Known underlying pathologic fracture. Otherwise, no focal hypermetabolic activity to suggest skeletal metastasis or active myeloma. Specifically, the lytic lesion along the left posterior aspect of the L2 vertebral body (series 4/image 149) is non FDG avid, max SUV 1.5. The lytic lesion along the left iliac bone/acetabulum (series 4/image 195) is essentially non FDG avid, max SUV 1.9. Incidental CT findings: Status post ORIF of the left hip. Degenerative changes of the visualized thoracolumbar spine. EXTREMITIES: No abnormal hypermetabolic activity in the lower extremities. Incidental CT findings: none IMPRESSION: Status post ORIF of the right hip with known underlying pathologic fracture. Additional lytic lesions involving the L2 vertebral body and left iliac bone/acetabulum are non FDG avid. No findings suggestive of active myeloma. 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 If clinically warranted given the patient's age/comorbidities, consider ENT consultation. Additional ancillary findings as above. Electronically Signed   By: Charline BillsSriyesh  Krishnan M.D.   On: 05/23/2022 03:12     ASSESSMENT/PLAN:  Frank Hernandez is a 82 y.o. male who returns for a follow up for multiple myeloma.    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 Kappa light chain was elevated at 120.0, lambda free light chain 14.4 and ratio 8.33. --Underwent bone marrow biopsy on 04/22/2022 that showed 30% abnormal plasma cell and AL Amyloidosis. --Primary treatment includes Daratumumab, Velcade, and Dexamethasone, started on 04/30/2022.  --Started monthly Xgeva for bone support, started on  04/30/2022.    -  #2 Subacute pathologic intertrochanteric fracture of his right femur s/p IM nailing --Imaging shows lytic lesion in the area confirming the pathologic nature of the fracture. Patient has not had any trauma or falls leading to the fracture. --CT CAP from 04/14/2022 showed multiple other bony lytic lesions without any other clear source of a primary tumor. -PET scan from 05/20/22 showing non-FDG avid additional lytic lesions of L2 and left iliac bone/acetabulum.    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  --Bx from area of pathologic rt hip fracture confirmed daignosis of multiple myeloma. -- PET scan from 05/20/22 showing non-FDG avid additional lytic lesions of L2 and left iliac bone/acetabulum.    #4 normocytic anemia.   -Hemoglobin is varying between 8-9.  Likely from metastatic malignancy/myeloma -Normal WBC count and platelets. 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  Could be from chronic hypertension. --UPEP with no M spike and no significant proteiuria to suggest overt Myeloma kidney or renal amyloidosis.  #6. Diarrhea/electrolyte abnormalities/AKI/hypotension -Secondary to diarrhea. Patient was hospitalized from 05/28/2022-06/01/2022.  -Initial  stool studies sent with position C diff antigen but negative C diff toxin so treated with oral vancomycin.GI pathogen panel negative. Toxigenic C diff by PCR was negative so vancomycin was discontinued.   PLAN: -Presents today for hospital follow up. Patient's energy is slowly improving. No longer experience diarrhea. -Labs today show Wbc 4.9, Hgb 8.1, Plt 273, Creatinine 1.26, Mag 1.6, LFTs normal. -Recommend to defer treatment by one week to continue to recover from recent hospitalization. -Proceed with 1 L of  IV fluids and 2 gm of IV mag today -Continue with protein shakes 2-3x/day -Stay hydrated drink 1-2 L of fluids per day/    FOLLOW-UP: -RTC in one week for labs, follow up with Dr. Candise Che to assess if patient can resume Cycle 2, Day 1 of Dara/Velcade/Dex.   All of the patient's questions were answered with apparent satisfaction. The patient knows to call the clinic with any problems, questions or concerns.  Orders Placed This Encounter  Procedures   Multiple Myeloma Panel (SPEP&IFE w/QIG)    Standing Status:   Future    Standing Expiration Date:   06/11/2023   Kappa/lambda light chains    Standing Status:   Future    Standing Expiration Date:   06/11/2023   CBC with Differential (Cancer Center Only)    Standing Status:   Future    Standing Expiration Date:   06/11/2023   CMP (Cancer Center only)    Standing Status:   Future    Standing Expiration Date:   06/11/2023     I have spent a total of 30 minutes minutes of face-to-face and non-face-to-face time, preparing to see the patient, obtaining and/or reviewing separately  counseling and educating the patient,  documenting clinical information in the electronic health record, and care coordination.   Georga Kaufmann PA-C Dept of Hematology and Oncology [facility] at [facility] Phone: [phone]

## 2022-06-08 DIAGNOSIS — Z79899 Other long term (current) drug therapy: Secondary | ICD-10-CM | POA: Diagnosis not present

## 2022-06-08 DIAGNOSIS — I498 Other specified cardiac arrhythmias: Secondary | ICD-10-CM | POA: Diagnosis not present

## 2022-06-08 DIAGNOSIS — E878 Other disorders of electrolyte and fluid balance, not elsewhere classified: Secondary | ICD-10-CM | POA: Diagnosis not present

## 2022-06-08 DIAGNOSIS — C9000 Multiple myeloma not having achieved remission: Secondary | ICD-10-CM | POA: Diagnosis not present

## 2022-06-08 DIAGNOSIS — D649 Anemia, unspecified: Secondary | ICD-10-CM | POA: Diagnosis not present

## 2022-06-08 DIAGNOSIS — L89613 Pressure ulcer of right heel, stage 3: Secondary | ICD-10-CM | POA: Diagnosis not present

## 2022-06-08 DIAGNOSIS — N1831 Chronic kidney disease, stage 3a: Secondary | ICD-10-CM | POA: Diagnosis not present

## 2022-06-09 DIAGNOSIS — I129 Hypertensive chronic kidney disease with stage 1 through stage 4 chronic kidney disease, or unspecified chronic kidney disease: Secondary | ICD-10-CM | POA: Diagnosis not present

## 2022-06-09 DIAGNOSIS — L89613 Pressure ulcer of right heel, stage 3: Secondary | ICD-10-CM | POA: Diagnosis not present

## 2022-06-09 DIAGNOSIS — C679 Malignant neoplasm of bladder, unspecified: Secondary | ICD-10-CM | POA: Diagnosis not present

## 2022-06-09 DIAGNOSIS — I959 Hypotension, unspecified: Secondary | ICD-10-CM | POA: Diagnosis not present

## 2022-06-09 DIAGNOSIS — C7951 Secondary malignant neoplasm of bone: Secondary | ICD-10-CM | POA: Diagnosis not present

## 2022-06-09 DIAGNOSIS — C9000 Multiple myeloma not having achieved remission: Secondary | ICD-10-CM | POA: Diagnosis not present

## 2022-06-09 DIAGNOSIS — L89322 Pressure ulcer of left buttock, stage 2: Secondary | ICD-10-CM | POA: Diagnosis not present

## 2022-06-09 DIAGNOSIS — M84551D Pathological fracture in neoplastic disease, right femur, subsequent encounter for fracture with routine healing: Secondary | ICD-10-CM | POA: Diagnosis not present

## 2022-06-09 DIAGNOSIS — N1831 Chronic kidney disease, stage 3a: Secondary | ICD-10-CM | POA: Diagnosis not present

## 2022-06-09 LAB — MULTIPLE MYELOMA PANEL, SERUM
Albumin SerPl Elph-Mcnc: 3.1 g/dL (ref 2.9–4.4)
Albumin/Glob SerPl: 1.2 (ref 0.7–1.7)
Alpha 1: 0.3 g/dL (ref 0.0–0.4)
Alpha2 Glob SerPl Elph-Mcnc: 0.9 g/dL (ref 0.4–1.0)
B-Globulin SerPl Elph-Mcnc: 0.6 g/dL — ABNORMAL LOW (ref 0.7–1.3)
Gamma Glob SerPl Elph-Mcnc: 0.8 g/dL (ref 0.4–1.8)
Globulin, Total: 2.6 g/dL (ref 2.2–3.9)
IgA: 21 mg/dL — ABNORMAL LOW (ref 61–437)
IgG (Immunoglobin G), Serum: 943 mg/dL (ref 603–1613)
IgM (Immunoglobulin M), Srm: 10 mg/dL — ABNORMAL LOW (ref 15–143)
M Protein SerPl Elph-Mcnc: 0.4 g/dL — ABNORMAL HIGH
Total Protein ELP: 5.7 g/dL — ABNORMAL LOW (ref 6.0–8.5)

## 2022-06-10 ENCOUNTER — Telehealth: Payer: Self-pay

## 2022-06-10 ENCOUNTER — Ambulatory Visit: Payer: Medicare PPO | Admitting: Physician Assistant

## 2022-06-10 ENCOUNTER — Other Ambulatory Visit: Payer: Self-pay

## 2022-06-10 DIAGNOSIS — C7951 Secondary malignant neoplasm of bone: Secondary | ICD-10-CM | POA: Diagnosis not present

## 2022-06-10 DIAGNOSIS — M84551D Pathological fracture in neoplastic disease, right femur, subsequent encounter for fracture with routine healing: Secondary | ICD-10-CM | POA: Diagnosis not present

## 2022-06-10 DIAGNOSIS — C9000 Multiple myeloma not having achieved remission: Secondary | ICD-10-CM | POA: Diagnosis not present

## 2022-06-10 DIAGNOSIS — N1831 Chronic kidney disease, stage 3a: Secondary | ICD-10-CM | POA: Diagnosis not present

## 2022-06-10 DIAGNOSIS — I959 Hypotension, unspecified: Secondary | ICD-10-CM | POA: Diagnosis not present

## 2022-06-10 DIAGNOSIS — I129 Hypertensive chronic kidney disease with stage 1 through stage 4 chronic kidney disease, or unspecified chronic kidney disease: Secondary | ICD-10-CM | POA: Diagnosis not present

## 2022-06-10 DIAGNOSIS — C679 Malignant neoplasm of bladder, unspecified: Secondary | ICD-10-CM | POA: Diagnosis not present

## 2022-06-10 DIAGNOSIS — L89322 Pressure ulcer of left buttock, stage 2: Secondary | ICD-10-CM | POA: Diagnosis not present

## 2022-06-10 DIAGNOSIS — L89613 Pressure ulcer of right heel, stage 3: Secondary | ICD-10-CM | POA: Diagnosis not present

## 2022-06-10 MED ORDER — VITAMIN D (ERGOCALCIFEROL) 1.25 MG (50000 UNIT) PO CAPS
50000.0000 [IU] | ORAL_CAPSULE | ORAL | 0 refills | Status: DC
Start: 2022-06-10 — End: 2022-06-25

## 2022-06-10 NOTE — Telephone Encounter (Signed)
Pt's wife called in to report pt not eating well. Pt has stated that trearmenrts are making him feel bad and he is not sure he wants to continue with them. Pt to see PAC prior to tx this week. Will have pt and wife discuss with PAC /Irene at that time. Dr Candise Che made aware.

## 2022-06-11 ENCOUNTER — Telehealth: Payer: Self-pay

## 2022-06-11 ENCOUNTER — Other Ambulatory Visit: Payer: Self-pay

## 2022-06-11 ENCOUNTER — Inpatient Hospital Stay: Payer: Medicare PPO

## 2022-06-11 ENCOUNTER — Inpatient Hospital Stay (HOSPITAL_BASED_OUTPATIENT_CLINIC_OR_DEPARTMENT_OTHER): Payer: Medicare PPO | Admitting: Physician Assistant

## 2022-06-11 VITALS — BP 117/64 | HR 72 | Temp 97.9°F | Resp 18 | Ht 69.5 in

## 2022-06-11 DIAGNOSIS — C9000 Multiple myeloma not having achieved remission: Secondary | ICD-10-CM

## 2022-06-11 DIAGNOSIS — E8581 Light chain (AL) amyloidosis: Secondary | ICD-10-CM | POA: Diagnosis not present

## 2022-06-11 DIAGNOSIS — C419 Malignant neoplasm of bone and articular cartilage, unspecified: Secondary | ICD-10-CM

## 2022-06-11 DIAGNOSIS — R4781 Slurred speech: Secondary | ICD-10-CM

## 2022-06-11 DIAGNOSIS — Z7189 Other specified counseling: Secondary | ICD-10-CM

## 2022-06-11 DIAGNOSIS — Z79899 Other long term (current) drug therapy: Secondary | ICD-10-CM | POA: Diagnosis not present

## 2022-06-11 DIAGNOSIS — E876 Hypokalemia: Secondary | ICD-10-CM

## 2022-06-11 DIAGNOSIS — D649 Anemia, unspecified: Secondary | ICD-10-CM | POA: Diagnosis not present

## 2022-06-11 DIAGNOSIS — Z5112 Encounter for antineoplastic immunotherapy: Secondary | ICD-10-CM | POA: Diagnosis not present

## 2022-06-11 DIAGNOSIS — E878 Other disorders of electrolyte and fluid balance, not elsewhere classified: Secondary | ICD-10-CM | POA: Diagnosis not present

## 2022-06-11 DIAGNOSIS — N183 Chronic kidney disease, stage 3 unspecified: Secondary | ICD-10-CM | POA: Diagnosis not present

## 2022-06-11 DIAGNOSIS — Z79624 Long term (current) use of inhibitors of nucleotide synthesis: Secondary | ICD-10-CM | POA: Diagnosis not present

## 2022-06-11 LAB — CMP (CANCER CENTER ONLY)
ALT: 10 U/L (ref 0–44)
AST: 22 U/L (ref 15–41)
Albumin: 3.6 g/dL (ref 3.5–5.0)
Alkaline Phosphatase: 142 U/L — ABNORMAL HIGH (ref 38–126)
Anion gap: 14 (ref 5–15)
BUN: 19 mg/dL (ref 8–23)
CO2: 18 mmol/L — ABNORMAL LOW (ref 22–32)
Calcium: 8.9 mg/dL (ref 8.9–10.3)
Chloride: 104 mmol/L (ref 98–111)
Creatinine: 1.65 mg/dL — ABNORMAL HIGH (ref 0.61–1.24)
GFR, Estimated: 41 mL/min — ABNORMAL LOW (ref >60)
Glucose, Bld: 72 mg/dL (ref 70–99)
Potassium: 2.5 mmol/L — CL (ref 3.5–5.1)
Sodium: 136 mmol/L (ref 135–145)
Total Bilirubin: 0.7 mg/dL (ref 0.3–1.2)
Total Protein: 6.3 g/dL — ABNORMAL LOW (ref 6.5–8.1)

## 2022-06-11 LAB — CBC WITH DIFFERENTIAL (CANCER CENTER ONLY)
Abs Immature Granulocytes: 0.01 10*3/uL (ref 0.00–0.07)
Basophils Absolute: 0 10*3/uL (ref 0.0–0.1)
Basophils Relative: 0 %
Eosinophils Absolute: 0.1 10*3/uL (ref 0.0–0.5)
Eosinophils Relative: 2 %
HCT: 24.5 % — ABNORMAL LOW (ref 39.0–52.0)
Hemoglobin: 8.4 g/dL — ABNORMAL LOW (ref 13.0–17.0)
Immature Granulocytes: 0 %
Lymphocytes Relative: 13 %
Lymphs Abs: 0.6 10*3/uL — ABNORMAL LOW (ref 0.7–4.0)
MCH: 30.9 pg (ref 26.0–34.0)
MCHC: 34.3 g/dL (ref 30.0–36.0)
MCV: 90.1 fL (ref 80.0–100.0)
Monocytes Absolute: 1 10*3/uL (ref 0.1–1.0)
Monocytes Relative: 21 %
Neutro Abs: 3 10*3/uL (ref 1.7–7.7)
Neutrophils Relative %: 64 %
Platelet Count: 276 10*3/uL (ref 150–400)
RBC: 2.72 MIL/uL — ABNORMAL LOW (ref 4.22–5.81)
RDW: 17.2 % — ABNORMAL HIGH (ref 11.5–15.5)
WBC Count: 4.8 10*3/uL (ref 4.0–10.5)
nRBC: 0 % (ref 0.0–0.2)

## 2022-06-11 LAB — MAGNESIUM: Magnesium: 2 mg/dL (ref 1.7–2.4)

## 2022-06-11 MED ORDER — POTASSIUM CHLORIDE IN NACL 20-0.9 MEQ/L-% IV SOLN
INTRAVENOUS | Status: AC
  Filled 2022-06-11: qty 1000

## 2022-06-11 MED ORDER — POTASSIUM CHLORIDE CRYS ER 20 MEQ PO TBCR
40.0000 meq | EXTENDED_RELEASE_TABLET | Freq: Once | ORAL | Status: AC
  Administered 2022-06-11: 40 meq via ORAL
  Filled 2022-06-11: qty 2

## 2022-06-11 MED ORDER — POTASSIUM CHLORIDE CRYS ER 20 MEQ PO TBCR: 20.0000 meq | EXTENDED_RELEASE_TABLET | Freq: Two times a day (BID) | ORAL | 0 refills | Status: DC

## 2022-06-11 NOTE — Progress Notes (Signed)
No treatment today per Frank Hernandez, Frank Hernandez. IVF and potassium only. Per Dr Candise Che, hold [place] as well.

## 2022-06-11 NOTE — Patient Instructions (Addendum)
Rehydration, Older Adult  Rehydration is the replacement of fluids, salts, and minerals in the body (electrolytes) that are lost during dehydration. Dehydration is when there is not enough water or other fluids in the body. This happens when you lose more fluids than you take in. People who are age 81 or older have a higher risk of dehydration than younger adults. This is because in older age, the body: Is less able to maintain the right amount of water. Does not respond to temperature changes as well. Does not get a sense of thirst as easily or quickly. Other causes include: Not drinking enough fluids. This can occur when you are ill, when you forget to drink, or when you are doing activities that require a lot of energy, especially in hot weather. Conditions that cause loss of water or other fluids. These include diarrhea, vomiting, sweating, or urinating a lot. Other illnesses, such as fever or infection. Certain medicines, such as those that remove excess fluid from the body (diuretics). Symptoms of mild or moderate dehydration may include thirst, dry lips and mouth, and dizziness. Symptoms of severe dehydration may include increased heart rate, confusion, fainting, and not urinating. In severe cases, you may need to get fluids through an IV at the hospital. For mild or moderate cases, you can usually rehydrate at home by drinking certain fluids as told by your health care provider. What are the risks? Rehydration is usually safe. Taking in too much fluid (overhydration) can be a problem but is rare. Overhydration can cause an imbalance of electrolytes in the body, kidney failure, fluid in the lungs, or a decrease in salt (sodium) levels in the body. Supplies needed: You will need an oral rehydration solution (ORS) if your health care provider tells you to use one. This is a drink to treat dehydration. It can be found in pharmacies and retail stores. How to rehydrate Fluids Follow  instructions from your health care provider about what to drink. The kind of fluid and the amount you should drink depend on your condition. In general, you should choose drinks that you prefer. If told by your health care provider, drink an ORS. Make an ORS by following instructions on the package. Start by drinking small amounts, about  cup (120 mL) every 5-10 minutes. Slowly increase how much you drink until you have taken in the amount recommended by your health care provider. Drink enough clear fluids to keep your urine pale yellow. If you were told to drink an ORS, finish it first, then start slowly drinking other clear fluids. Drink fluids such as: Water. This includes sparkling and flavored water. Drinking only water can lead to having too little sodium in your body (hyponatremia). Follow the advice of your health care provider. Water from ice chips you suck on. Fruit juice with water added to it(diluted). Sports drinks. Hot or cold herbal teas. Broth-based soups. Coffee. Milk or milk products. Food Follow instructions from your health care provider about what to eat while you rehydrate. Your health care provider may recommend that you slowly begin eating regular foods in small amounts. Eat foods that contain a healthy balance of electrolytes, such as bananas, oranges, potatoes, tomatoes, and spinach. Avoid foods that are greasy or contain a lot of sugar. In some cases, you may get nutrition through a feeding tube that is passed through your nose and into your stomach (nasogastric tube, or NG tube). This may be done if you have uncontrolled vomiting or diarrhea. Drinks to avoid  Certain drinks may make dehydration worse. While you rehydrate, avoid drinking alcohol. How to tell if you are recovering from dehydration You may be getting better if: You are urinating more often than before you started rehydrating. Your urine is pale yellow. Your energy level improves. You vomit less  often. You have diarrhea less often. Your appetite improves or returns to normal. You feel less dizzy or light-headed. Your skin tone and color start to look more normal. Follow these instructions at home: Take over-the-counter and prescription medicines only as told by your health care provider. Do not take sodium tablets. Doing this can lead to having too much sodium in your body (hypernatremia). Contact a health care provider if: You continue to have symptoms of mild or moderate dehydration, such as: Thirst. Dry lips. Slightly dry mouth. Dizziness. Dark urine or less urine than usual. Muscle cramps. You continue to vomit or have diarrhea. Get help right away if: You have symptoms of dehydration that get worse. You have a fever. You have a severe headache. You have been vomiting and have problems, such as: Your vomiting gets worse. Your vomit includes blood or green matter (bile). You cannot eat or drink without vomiting. You have problems with urination or bowel movements, such as: Diarrhea that gets worse. Blood in your stool (feces). This may cause stool to look black and tarry. Not urinating, or urinating only a small amount of very dark urine, within 6-8 hours. You have trouble breathing. You have symptoms that get worse with treatment. These symptoms may be an emergency. Get help right away. Call 911. Do not wait to see if the symptoms will go away. Do not drive yourself to the hospital. This information is not intended to replace advice given to you by your health care provider. Make sure you discuss any questions you have with your health care provider. Document Revised: 07/01/2021 Document Reviewed: 06/29/2021 Elsevier Patient Education  2023 Elsevier Inc.  Potassium Chloride Injection What is this medication? POTASSIUM CHLORIDE (poe TASS i um KLOOR ide) prevents and treats low levels of potassium in your body. Potassium plays an important role in maintaining the  health of your kidneys, heart, muscles, and nervous system. This medicine may be used for other purposes; ask your health care provider or pharmacist if you have questions. COMMON BRAND NAME(S): PROAMP What should I tell my care team before I take this medication? They need to know if you have any of these conditions: Addison disease Dehydration Diabetes (high blood sugar) Heart disease High levels of potassium in the blood Irregular heartbeat or rhythm Kidney disease Large areas of burned skin An unusual or allergic reaction to potassium, other medications, foods, dyes, or preservatives Pregnant or trying to get pregnant Breast-feeding How should I use this medication? This medication is injected into a vein. It is given in a hospital or clinic setting. Talk to your care team about the use of this medication in children. Special care may be needed. Overdosage: If you think you have taken too much of this medicine contact a poison control center or emergency room at once. NOTE: This medicine is only for you. Do not share this medicine with others. What if I miss a dose? This does not apply. This medication is not for regular use. What may interact with this medication? Do not take this medication with any of the following: Certain diuretics, such as spironolactone, triamterene Eplerenone Sodium polystyrene sulfonate This medication may also interact with the following: Certain medications for  blood pressure or heart disease, such as lisinopril, losartan, quinapril, valsartan Medications that lower your chance of fighting infection, such as cyclosporine, tacrolimus NSAIDs, medications for pain and inflammation, such as ibuprofen or naproxen Other potassium supplements Salt substitutes This list may not describe all possible interactions. Give your health care provider a list of all the medicines, herbs, non-prescription drugs, or dietary supplements you use. Also tell them if you  smoke, drink alcohol, or use illegal drugs. Some items may interact with your medicine. What should I watch for while using this medication? Visit your care team for regular checks on your progress. Tell your care team if your symptoms do not start to get better or if they get worse. You may need blood work while you are taking this medication. Avoid salt substitutes unless you are told otherwise by your care team. What side effects may I notice from receiving this medication? Side effects that you should report to your care team as soon as possible: Allergic reactions--skin rash, itching, hives, swelling of the face, lips, tongue, or throat High potassium level--muscle weakness, fast or irregular heartbeat Side effects that usually do not require medical attention (report to your care team if they continue or are bothersome): Diarrhea Nausea Stomach pain Vomiting This list may not describe all possible side effects. Call your doctor for medical advice about side effects. You may report side effects to FDA at 1-800-FDA-1088. Where should I keep my medication? This medication is given in a hospital or clinic. It will not be stored at home. NOTE: This sheet is a summary. It may not cover all possible information. If you have questions about this medicine, talk to your doctor, pharmacist, or health care provider.  2023 Elsevier/Gold Standard (2007-04-08 00:00:00)

## 2022-06-11 NOTE — Telephone Encounter (Signed)
CRITICAL VALUE STICKER  CRITICAL VALUE:   potassium  2.5  RECEIVER (on-site recipient of call):  Daneil Dolin, LPN  DATE & TIME NOTIFIED: 06/11/2022   13:57  MESSENGER (representative from lab):  Shanda Bumps  MD NOTIFIED: Georga Kaufmann, PA  TIME OF NOTIFICATION:  13:59  RESPONSE:

## 2022-06-13 ENCOUNTER — Encounter: Payer: Self-pay | Admitting: Hematology

## 2022-06-13 NOTE — Progress Notes (Unsigned)
Office Visit    Patient Name: Frank Hernandez Date of Encounter: 06/13/2022  Primary Care Provider:  [name] (Inactive) Primary Cardiologist:  None Primary Electrophysiologist: None  Chief Complaint    Frank Hernandez is a 82 y.o. male with PMH of bradycardia, HTN, history of bladder CA, multiple myeloma (s/p pathological fracture of the hip), CKD stage IIIa who presents today for posthospital follow-up of bradycardia.  Past Medical History    Past Medical History:  Diagnosis Date   Bleeding from the nose    Hypertension    Pre-diabetes    Past Surgical History:  Procedure Laterality Date   COLONOSCOPY     INTRAMEDULLARY (IM) NAIL INTERTROCHANTERIC Left 06/11/2020   Procedure: INTRAMEDULLARY (IM) NAIL INTERTROCHANTRIC;  Surgeon: Roby Lofts, MD;  Location: [facility] OR;  Service: Orthopedics;  Laterality: Left;   INTRAMEDULLARY (IM) NAIL INTERTROCHANTERIC Right 04/17/2022   Procedure: INTRAMEDULLARY (IM) NAIL INTERTROCHANTERIC;  Surgeon: London Sheer, MD;  Location: [facility];  Service: Orthopedics;  Laterality: Right;   KNEE ARTHROSCOPY Bilateral    POLYPECTOMY      Allergies  No Known Allergies  History of Present Illness    Frank Hernandez  is a 82 year old male with the above mention past medical history who presents today for post hospital follow-up of bradycardia.  Frank Hernandez was initially seen by Dr. Elease Hashimoto in 2018 for complaint of near syncope.  He wore a 30-day event monitor that showed pauses at night with heart rates in the 30s and heart rates in the 140s during the day.  He was referred to EP and was seen by Dr. Ladona Ridgel on 06/21/2016.  Discussion was made for ILR but due to patient being asymptomatic decision was made to hold off and practice watchful waiting.  During visit blood pressure was elevated and patient was encouraged to decrease salt intake.  He presented to the ED on 05/28/2022 with complaint of hypotension.  He recently started  treatment for multiple myeloma 3 weeks prior to his visit.  He was noted to also be suffering from hypokalemia and low magnesium.  EKG in the ED showed junctional rhythm with first-degree AVB.  He was asymptomatic with otherwise stable rhythm.  Since last being seen in the office patient reports***.  Patient denies chest pain, palpitations, dyspnea, PND, orthopnea, nausea, vomiting, dizziness, syncope, edema, weight gain, or early satiety.     ***Notes: -Blood pressure due to CKD stage IIIa -Patient may need new event monitor Home Medications    Current Outpatient Medications  Medication Sig Dispense Refill   acyclovir (ZOVIRAX) 400 MG tablet Take 1 tablet (400 mg total) by mouth 2 (two) times daily. 60 tablet 5   aspirin EC 81 MG tablet Take 1 tablet (81 mg total) by mouth daily. Swallow whole. 30 tablet 12   feeding supplement (ENSURE ENLIVE / ENSURE PLUS) LIQD Take 237 mLs by mouth 2 (two) times daily between meals. (Patient taking differently: Take 237 mLs by mouth daily. May use Boost) 237 mL 12   ferrous sulfate 325 (65 FE) MG tablet Take 1 tablet (325 mg total) by mouth 2 (two) times daily with a meal.  3   loperamide (IMODIUM) 2 MG capsule Take 1 capsule (2 mg total) by mouth as needed for diarrhea or loose stools. 30 capsule 0   ondansetron (ZOFRAN) 8 MG tablet Take 8 mg by mouth 30 to 60 min prior to Cyclophosphamide administration then take 8 mg every 8 hrs  as needed for nausea and vomiting. 30 tablet 1   potassium chloride SA (KLOR-CON M) 20 MEQ tablet Take 1 tablet (20 mEq total) by mouth 2 (two) times daily. 60 tablet 0   prochlorperazine (COMPAZINE) 10 MG tablet Take 1 tablet (10 mg total) by mouth every 6 (six) hours as needed for nausea or vomiting. 30 tablet 1   Vitamin D, Ergocalciferol, (DRISDOL) 1.25 MG (50000 UNIT) CAPS capsule Take 1 capsule (50,000 Units total) by mouth once a week. 5 capsule 0   No current facility-administered medications for this visit.      Review of Systems  Please see the history of present illness.    (+)*** (+)***  All other systems reviewed and are otherwise negative except as noted above.  Physical Exam    Wt Readings from Last 3 Encounters:  06/04/22 219 lb 1.6 oz (99.4 kg)  05/28/22 204 lb (92.5 kg)  05/07/22 204 lb 12 oz (92.9 kg)   ZO:XWRUE were no vitals filed for this visit.,There is no height or weight on file to calculate BMI.  Constitutional:      Appearance: Healthy appearance. Not in distress.  Neck:     Vascular: JVD normal.  Pulmonary:     Effort: Pulmonary effort is normal.     Breath sounds: No wheezing. No rales. Diminished in the bases Cardiovascular:     Normal rate. Regular rhythm. Normal S1. Normal S2.      Murmurs: There is no murmur.  Edema:    Peripheral edema absent.  Abdominal:     Palpations: Abdomen is soft non tender. There is no hepatomegaly.  Skin:    General: Skin is warm and dry.  Neurological:     General: No focal deficit present.     Mental Status: Alert and oriented to person, place and time.     Cranial Nerves: Cranial nerves are intact.  EKG/LABS/ Recent Cardiac Studies    ECG personally reviewed by me today - ***  Risk Assessment/Calculations:   {Does this patient have ATRIAL FIBRILLATION?[phone]}        Lab Results  Component Value Date   WBC 4.8 06/11/2022   HGB 8.4 (L) 06/11/2022   HCT 24.5 (L) 06/11/2022   MCV 90.1 06/11/2022   PLT 276 06/11/2022   Lab Results  Component Value Date   CREATININE 1.65 (H) 06/11/2022   BUN 19 06/11/2022   NA 136 06/11/2022   K 2.5 (LL) 06/11/2022   CL 104 06/11/2022   CO2 18 (L) 06/11/2022   Lab Results  Component Value Date   ALT 10 06/11/2022   AST 22 06/11/2022   ALKPHOS 142 (H) 06/11/2022   BILITOT 0.7 06/11/2022   No results found for: "CHOL", "HDL", "LDLCALC", "LDLDIRECT", "TRIG", "CHOLHDL"  Lab Results  Component Value Date   HGBA1C 6.0 (H) 04/13/2022    Cardiac Studies & Procedures        ECHOCARDIOGRAM  ECHOCARDIOGRAM COMPLETE 04/22/2022  Narrative ECHOCARDIOGRAM REPORT    Patient Name:   Frank Hernandez Date of Exam: 04/22/2022 Medical Rec #:  [id number]           Height:       69.5 in Accession #:    [id number]          Weight:       207.2 lb Date of Birth:  [birth year]            BSA:          2.108 m  Patient Age:    81 years            BP:           128/72 mmHg Patient Gender: M                   HR:           68 bpm. Exam Location:  Inpatient  Procedure: 2D Echo and Intracardiac Opacification Agent  Indications:    CHF  History:        Patient has prior history of Echocardiogram examinations and Patient has no prior history of Echocardiogram examinations. Risk Factors:Hypertension.  Sonographer:    Cathie Hoops Referring Phys: [id number] Johney Maine   Sonographer Comments: Technically difficult study due to poor echo windows, suboptimal parasternal window, no subcostal window and patient is obese. Image acquisition challenging due to patient body habitus and Image acquisition challenging due to respiratory motion. IMPRESSIONS   1. Left ventricular ejection fraction, by estimation, is 60 to 65%. The left ventricle has normal function. The left ventricle has no regional wall motion abnormalities. Left ventricular diastolic parameters are indeterminate. 2. Right ventricular systolic function is normal. The right ventricular size is normal. 3. The mitral valve is grossly normal. No evidence of mitral valve regurgitation. No evidence of mitral stenosis. 4. The aortic valve was not well visualized. Aortic valve regurgitation is mild. Aortic valve sclerosis is present, with no evidence of aortic valve stenosis. 5. IVC not well visualized.  FINDINGS Left Ventricle: Left ventricular ejection fraction, by estimation, is 60 to 65%. The left ventricle has normal function. The left ventricle has no regional wall motion abnormalities. Definity contrast  agent was given IV to delineate the left ventricular endocardial borders. 3D left ventricular ejection fraction analysis performed but not reported based on interpreter judgement due to suboptimal tracking. The left ventricular internal cavity size was normal in size. There is no left ventricular hypertrophy. Left ventricular diastolic parameters are indeterminate.  Right Ventricle: The right ventricular size is normal. No increase in right ventricular wall thickness. Right ventricular systolic function is normal.  Left Atrium: Left atrial size was normal in size.  Right Atrium: Right atrial size was normal in size.  Pericardium: There is no evidence of pericardial effusion.  Mitral Valve: The mitral valve is grossly normal. No evidence of mitral valve regurgitation. No evidence of mitral valve stenosis.  Tricuspid Valve: The tricuspid valve is normal in structure. Tricuspid valve regurgitation is trivial. No evidence of tricuspid stenosis.  Aortic Valve: The aortic valve was not well visualized. Aortic valve regurgitation is mild. Aortic regurgitation PHT measures 632 msec. Aortic valve sclerosis is present, with no evidence of aortic valve stenosis. Aortic valve mean gradient measures 4.5 mmHg. Aortic valve peak gradient measures 8.7 mmHg. Aortic valve area, by VTI measures 2.69 cm.  Pulmonic Valve: The pulmonic valve was normal in structure. Pulmonic valve regurgitation is not visualized. No evidence of pulmonic stenosis.  Aorta: The aortic root is normal in size and structure.  Venous: The inferior vena cava was not well visualized.  IAS/Shunts: No atrial level shunt detected by color flow Doppler.   LEFT VENTRICLE PLAX 2D LVIDd:         4.20 cm     Diastology LVIDs:         3.00 cm     LV e' medial:    13.25 cm/s LV PW:         0.90 cm  LV E/e' medial:  8.3 LV IVS:        1.00 cm     LV e' lateral:   15.80 cm/s LVOT diam:     2.20 cm     LV E/e' lateral: 7.0 LV SV:          71 LV SV Index:   34 LVOT Area:     3.80 cm  3D Volume EF: LV Volumes (MOD)           LV EDV:       126 ml LV vol d, MOD A2C: 45.3 ml LV ESV:       47 ml LV vol d, MOD A4C: 84.7 ml LV SV:        79 ml LV vol s, MOD A2C: 18.9 ml LV vol s, MOD A4C: 29.6 ml LV SV MOD A2C:     26.4 ml LV SV MOD A4C:     84.7 ml LV SV MOD BP:      40.7 ml  RIGHT VENTRICLE RV Basal diam:  3.80 cm RV Mid diam:    3.00 cm RV S prime:     15.55 cm/s TAPSE (M-mode): 2.0 cm  LEFT ATRIUM             Index        RIGHT ATRIUM           Index LA Vol (A2C):   46.6 ml 22.11 ml/m  RA Area:     15.30 cm LA Vol (A4C):   40.9 ml 19.40 ml/m  RA Volume:   40.60 ml  19.26 ml/m LA Biplane Vol: 48.2 ml 22.87 ml/m AORTIC VALVE                    PULMONIC VALVE AV Area (Vmax):    2.65 cm     PV Vmax:       1.06 m/s AV Area (Vmean):   2.62 cm     PV Peak grad:  4.5 mmHg AV Area (VTI):     2.69 cm AV Vmax:           147.50 cm/s AV Vmean:          94.975 cm/s AV VTI:            0.264 m AV Peak Grad:      8.7 mmHg AV Mean Grad:      4.5 mmHg LVOT Vmax:         102.85 cm/s LVOT Vmean:        65.400 cm/s LVOT VTI:          0.187 m LVOT/AV VTI ratio: 0.71 AI PHT:            632 msec  AORTA Ao Root diam: 3.50 cm  MITRAL VALVE                TRICUSPID VALVE MV Area (PHT): 4.60 cm     TR Peak grad:   29.4 mmHg MV Decel Time: 165 msec     TR Vmax:        271.00 cm/s MV E velocity: 110.00 cm/s MV A velocity: 61.80 cm/s   SHUNTS MV E/A ratio:  1.78         Systemic VTI:  0.19 m Systemic Diam: 2.20 cm  Weston Brass MD Electronically signed by Weston Brass MD Signature Date/Time: 04/22/2022/3:32:25 PM    Final    MONITORS  CARDIAC EVENT MONITOR 06/30/2016  Narrative  NSR  Frequent episodes of pauses ( primarily [place] I) while sleeping  Occasional episodes of tachycardia           Assessment & Plan    1.  Bradycardia arrhythmia: -Patient recently admitted for hypovolemia and hypotension  with bradycardia and pauses on overnight telemetry.  EKG showed first-degree AVB with junctional rhythm. -Today patient reports***  2..  Essential hypertension: -Patient's last blood pressure was***  3.  CKD stage IIIa: -Patient's most recent creatinine was 1.65 on 4/12  4.  History of multiple myeloma:  5.  Hypokalemia: -Patient potassium was 2.5 on 4/12      Disposition: Follow-up with None or APP in *** months {Are you ordering a CV Procedure (e.g. stress test, cath, DCCV, TEE, etc)?   Press F2        :[id number]}   Medication Adjustments/Labs and Tests Ordered: Current medicines are reviewed at length with the patient today.  Concerns regarding medicines are outlined above.   Signed, Napoleon Form, Leodis Rains, NP 06/13/2022, 11:46 AM [facility]  Note:  This document was prepared using Dragon voice recognition software and may include unintentional dictation errors.

## 2022-06-13 NOTE — Progress Notes (Signed)
[facility] OFFICE PROGRESS NOTE  Frank Hernandez (Inactive)   DIAGNOSIS: Evaluation and management of multiple myeloma   CURRENT THERAPY: Dara/Velcade/Dex.   INTERVAL HISTORY: Frank Hernandez 82 y.o. male returns to the clinic today for a follow-up visit accompanied by his wife.  The patient is followed by Dr. Candise Che for multiple myeloma.  He was last seen by me on 06/04/2022.   Mr. Propps reports that his energy levels are still poor. His wife adds that he isn't eating or drinking much. He did have 1-2 episodes of nausea per day with occasional vomiting. His diarrhea has improved but he has 1-2 episodes per day. His stools are dark. His wife reports that his speech a slurred and she cannot understand him at time.He denies easy bruising or signs of bleeding. He denies fevers, chills, sweats, shortness of breath, chest pain or cough. He has no other complaints.    MEDICAL HISTORY: Past Medical History:  Diagnosis Date   Bleeding from the nose    Hypertension    Pre-diabetes     ALLERGIES:  has No Known Allergies.  MEDICATIONS:  Current Outpatient Medications  Medication Sig Dispense Refill   acyclovir (ZOVIRAX) 400 MG tablet Take 1 tablet (400 mg total) by mouth 2 (two) times daily. 60 tablet 5   aspirin EC 81 MG tablet Take 1 tablet (81 mg total) by mouth daily. Swallow whole. 30 tablet 12   feeding supplement (ENSURE ENLIVE / ENSURE PLUS) LIQD Take 237 mLs by mouth 2 (two) times daily between meals. (Patient taking differently: Take 237 mLs by mouth daily. May use Boost) 237 mL 12   ferrous sulfate 325 (65 FE) MG tablet Take 1 tablet (325 mg total) by mouth 2 (two) times daily with a meal.  3   loperamide (IMODIUM) 2 MG capsule Take 1 capsule (2 mg total) by mouth as needed for diarrhea or loose stools. 30 capsule 0   ondansetron (ZOFRAN) 8 MG tablet Take 8 mg by mouth 30 to 60 min prior to Cyclophosphamide administration then take 8 mg every 8 hrs as needed for  nausea and vomiting. 30 tablet 1   potassium chloride SA (KLOR-CON M) 20 MEQ tablet Take 1 tablet (20 mEq total) by mouth 2 (two) times daily. 60 tablet 0   prochlorperazine (COMPAZINE) 10 MG tablet Take 1 tablet (10 mg total) by mouth every 6 (six) hours as needed for nausea or vomiting. 30 tablet 1   Vitamin D, Ergocalciferol, (DRISDOL) 1.25 MG (50000 UNIT) CAPS capsule Take 1 capsule (50,000 Units total) by mouth once a week. 5 capsule 0   No current facility-administered medications for this visit.    SURGICAL HISTORY:  Past Surgical History:  Procedure Laterality Date   COLONOSCOPY     INTRAMEDULLARY (IM) NAIL INTERTROCHANTERIC Left 06/11/2020   Procedure: INTRAMEDULLARY (IM) NAIL INTERTROCHANTRIC;  Surgeon: Roby Lofts, MD;  Location: [facility] OR;  Service: Orthopedics;  Laterality: Left;   INTRAMEDULLARY (IM) NAIL INTERTROCHANTERIC Right 04/17/2022   Procedure: INTRAMEDULLARY (IM) NAIL INTERTROCHANTERIC;  Surgeon: London Sheer, MD;  Location: [facility] ORS;  Service: Orthopedics;  Laterality: Right;   KNEE ARTHROSCOPY Bilateral    POLYPECTOMY      REVIEW OF SYSTEMS:   Review of Systems  Constitutional: Positive for weakness and decreased appetite. Negative for chills, fever and unexpected weight change.  HENT: Negative for mouth sores, nosebleeds, sore throat and trouble swallowing.   Eyes: Negative for eye problems and icterus.  Respiratory: Negative for  cough, hemoptysis, shortness of breath and wheezing.   Cardiovascular: Negative for chest pain and leg swelling.  Gastrointestinal: Positive for nausea, vomiting and diarrhea.  Negative for abdominal pain, constipation. Genitourinary: Negative for bladder incontinence, difficulty urinating, dysuria, frequency and hematuria.   Musculoskeletal: Negative for back pain, gait problem, neck pain and neck stiffness.  Skin: Negative for itching and rash.  Neurological: Negative for dizziness, extremity weakness, gait problem, headaches,  light-headedness and seizures.  Hematological: Negative for adenopathy. Does not bruise/bleed easily.  Psychiatric/Behavioral: Positive for AMS. Negative for depression and sleep disturbance. The patient is not nervous/anxious.     PHYSICAL EXAMINATION:  Blood pressure 117/64, pulse 72, temperature 97.9 F (36.6 C), temperature source Temporal, resp. rate 18, height 5' 9.5" (1.765 m), SpO2 100 %.  ECOG PERFORMANCE STATUS: 3  Physical Exam  Constitutional: Oriented to person and place. Well-developed, well-nourished, and in no distress.  HENT:  Head: Normocephalic and atraumatic.  Mouth/Throat: Oropharynx is clear and moist. No oropharyngeal exudate.  Eyes: Conjunctivae are normal. Right eye exhibits no discharge. Left eye exhibits no discharge. No scleral icterus.  Neck: Normal range of motion. Neck supple.  Cardiovascular: Normal rate, regular rhythm, normal heart sounds and intact distal pulses.   Pulmonary/Chest: Effort normal and breath sounds normal. No respiratory distress. No wheezes. No rales.  Musculoskeletal: Normal range of motion. Exhibits no edema.  Lymphadenopathy:    No cervical adenopathy.  Neurological: Alert and oriented to person, place and time. He does have occasional episodes of slurred speech. No other neurological deficits.  Exhibits muscle wasting.  He was examined in the wheelchair.   Skin: Skin is warm and dry. No rash noted. Not diaphoretic. No erythema. No pallor.   LABORATORY DATA: Lab Results  Component Value Date   WBC 4.8 06/11/2022   HGB 8.4 (L) 06/11/2022   HCT 24.5 (L) 06/11/2022   MCV 90.1 06/11/2022   PLT 276 06/11/2022      Chemistry      Component Value Date/Time   NA 136 06/11/2022 1317   K 2.5 (LL) 06/11/2022 1317   CL 104 06/11/2022 1317   CO2 18 (L) 06/11/2022 1317   BUN 19 06/11/2022 1317   CREATININE 1.65 (H) 06/11/2022 1317      Component Value Date/Time   CALCIUM 8.9 06/11/2022 1317   ALKPHOS 142 (H) 06/11/2022 1317    AST 22 06/11/2022 1317   ALT 10 06/11/2022 1317   BILITOT 0.7 06/11/2022 1317       RADIOGRAPHIC STUDIES:  NM PET Image Initial (PI) Whole Body  Result Date: 05/23/2022 CLINICAL DATA:  Initial treatment strategy for multiple myeloma. EXAM: NUCLEAR MEDICINE PET WHOLE BODY TECHNIQUE: 10.1 mCi F-18 FDG was injected intravenously. Full-ring PET imaging was performed from the head to foot after the radiotracer. CT data was obtained and used for attenuation correction and anatomic localization. Fasting blood glucose: 83 mg/dl COMPARISON:  CT chest abdomen pelvis dated 04/14/2022 FINDINGS: Mediastinal blood pool activity: SUV max 2.6 HEAD/NECK: No hypermetabolic activity in the scalp. No hypermetabolic cervical lymph nodes. 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 Incidental CT findings: none CHEST: No suspicious pulmonary nodules. No hypermetabolic thoracic lymphadenopathy. Incidental CT findings: Moderate coronary atherosclerosis of the LAD and right coronary artery. ABDOMEN/PELVIS: No abnormal hypermetabolism in the liver, spleen, pancreas, or adrenal glands. No hypermetabolic abdominopelvic lymphadenopathy. Incidental CT findings: Bilateral renal cysts, measuring up to 3.3 cm in the left lower pole,  benign (Bosniak I). No follow-up is recommended. Sigmoid diverticulosis, without evidence of diverticulitis. Moderate right inguinal/scrotal hernia containing the right anterior bladder (series 4/image 205) and loops of nondilated small bowel (series 4/image 222), without evidence of obstruction. Small fat containing left inguinal hernia. Atherosclerotic calcifications the abdominal aorta and branch vessels. SKELETON: Status post ORIF of the right hip with associated hypermetabolism. Known underlying pathologic fracture. Otherwise, no focal hypermetabolic activity to suggest skeletal metastasis or active myeloma. Specifically, the lytic lesion  along the left posterior aspect of the L2 vertebral body (series 4/image 149) is non FDG avid, max SUV 1.5. The lytic lesion along the left iliac bone/acetabulum (series 4/image 195) is essentially non FDG avid, max SUV 1.9. Incidental CT findings: Status post ORIF of the left hip. Degenerative changes of the visualized thoracolumbar spine. EXTREMITIES: No abnormal hypermetabolic activity in the lower extremities. Incidental CT findings: none IMPRESSION: Status post ORIF of the right hip with known underlying pathologic fracture. Additional lytic lesions involving the L2 vertebral body and left iliac bone/acetabulum are non FDG avid. No findings suggestive of active myeloma. 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 If clinically warranted given the patient's age/comorbidities, consider ENT consultation. Additional ancillary findings as above. Electronically Signed   By: Charline Bills M.D.   On: 05/23/2022 03:12     ASSESSMENT/PLAN:  TRAVANTE KNEE is a 82 y.o. male who returns for a follow up for multiple myeloma.    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 Kappa light chain was elevated at 120.0, lambda free light chain 14.4 and ratio 8.33. --Underwent bone marrow biopsy on 04/22/2022 that showed 30% abnormal plasma cell and AL Amyloidosis. --Primary treatment includes Daratumumab, Velcade, and Dexamethasone, started on 04/30/2022.  --Started monthly Xgeva for bone support, started on 04/30/2022.     #2 Subacute pathologic intertrochanteric fracture of his right femur s/p IM nailing --Imaging shows lytic lesion in the area confirming the pathologic nature of the fracture. Patient has not had any trauma or falls leading to the fracture. --CT CAP from 04/14/2022 showed multiple other bony lytic  lesions without any other clear source of a primary tumor. -PET scan from 05/20/22 showing non-FDG avid additional lytic lesions of L2 and left iliac bone/acetabulum.    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  --Bx from area of pathologic rt hip fracture confirmed daignosis of multiple myeloma. -- PET scan from 05/20/22 showing non-FDG avid additional lytic lesions of L2 and left iliac bone/acetabulum.    #4 normocytic anemia.   -Hemoglobin is varying between 8-9.  Likely from metastatic malignancy/myeloma -Normal WBC count and platelets. 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  Could be from chronic hypertension. --UPEP with no M spike and no significant proteiuria to suggest overt Myeloma kidney or renal amyloidosis.  #6. Diarrhea/electrolyte abnormalities/AKI/hypotension -Secondary to diarrhea. Patient was hospitalized from 05/28/2022-06/01/2022.  -Initial stool studies sent with position C diff antigen but negative C diff toxin so treated with oral vancomycin.GI pathogen panel negative. Toxigenic C diff by PCR was negative so vancomycin was discontinued.   PLAN: -Presents today to determine if he can resume treatment. -Labs today  show Wbc 4.8, Hgb 8.4, Plt 276, Creatinine increased to 1.65, Potassium is 2.5, Mag 2.0, LFTs normal. 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 Candise Che okay to monitor but we will get CT head to further evaluate.    FOLLOW-UP: -RTC in one week for labs, follow up with Dr. Candise Che to  assess if patient can resume Cycle 2, Day 1 of Dara/Velcade/Dex.   All of the patient's questions were answered with apparent satisfaction. The patient knows to call the clinic with any problems, questions or concerns.  No orders of the defined types were placed in this encounter.    I have spent a total of 30 minutes minutes of face-to-face and non-face-to-face time, preparing to see the patient, obtaining and/or reviewing separately  counseling and educating the patient,  documenting clinical information in the electronic health record, and care coordination.   Georga Kaufmann PA-C Dept of Hematology and Oncology [facility] Phone: [phone]

## 2022-06-14 ENCOUNTER — Encounter: Payer: Self-pay | Admitting: Nurse Practitioner

## 2022-06-14 ENCOUNTER — Ambulatory Visit (INDEPENDENT_AMBULATORY_CARE_PROVIDER_SITE_OTHER): Payer: Medicare PPO

## 2022-06-14 ENCOUNTER — Other Ambulatory Visit: Payer: Self-pay | Admitting: Nurse Practitioner

## 2022-06-14 ENCOUNTER — Ambulatory Visit: Payer: Medicare PPO | Attending: Physician Assistant | Admitting: Nurse Practitioner

## 2022-06-14 VITALS — BP 128/68 | HR 78 | Ht 69.5 in | Wt 219.0 lb

## 2022-06-14 DIAGNOSIS — C9000 Multiple myeloma not having achieved remission: Secondary | ICD-10-CM

## 2022-06-14 DIAGNOSIS — E876 Hypokalemia: Secondary | ICD-10-CM | POA: Diagnosis not present

## 2022-06-14 DIAGNOSIS — I498 Other specified cardiac arrhythmias: Secondary | ICD-10-CM

## 2022-06-14 DIAGNOSIS — R001 Bradycardia, unspecified: Secondary | ICD-10-CM

## 2022-06-14 DIAGNOSIS — I10 Essential (primary) hypertension: Secondary | ICD-10-CM | POA: Diagnosis not present

## 2022-06-14 DIAGNOSIS — N179 Acute kidney failure, unspecified: Secondary | ICD-10-CM

## 2022-06-14 LAB — KAPPA/LAMBDA LIGHT CHAINS
Kappa free light chain: 21.5 mg/L — ABNORMAL HIGH (ref 3.3–19.4)
Kappa, lambda light chain ratio: 1.72 — ABNORMAL HIGH (ref 0.26–1.65)
Lambda free light chains: 12.5 mg/L (ref 5.7–26.3)

## 2022-06-14 NOTE — Patient Instructions (Signed)
Medication Instructions:  Your physician recommends that you continue on your current medications as directed. Please refer to the Current Medication list given to you today. *If you need a refill on your cardiac medications before your next appointment, please call your pharmacy*   Lab Work: None ordered   Testing/Procedures: ZIO XT- Long Term Monitor Instructions  Your physician has requested you wear a ZIO patch monitor for 14 days.  This is a single patch monitor. [employer] supplies one patch monitor per enrollment. Additional stickers are not available. Please do not apply patch if you will be having a Nuclear Stress Test,  Echocardiogram, Cardiac CT, MRI, or Chest Xray during the period you would be wearing the  monitor. The patch cannot be worn during these tests. You cannot remove and re-apply the  ZIO XT patch monitor.  Your ZIO patch monitor will be mailed 3 day USPS to your address on file. It may take 3-5 days  to receive your monitor after you have been enrolled.  Once you have received your monitor, please review the enclosed instructions. Your monitor  has already been registered assigning a specific monitor serial # to you.  Billing and Patient Assistance Program Information  We have supplied [employer] with any of your insurance information on file for billing purposes. [employer] offers a sliding scale Patient Assistance Program for patients that do not have  insurance, or whose insurance does not completely cover the cost of the ZIO monitor.  You must apply for the Patient Assistance Program to qualify for this discounted rate.  To apply, please call [employer] at [phone], select option 4, select option 2, ask to apply for  Patient Assistance Program. Theodore Demark will ask your household income, and how many people  are in your household. They will quote your out-of-pocket cost based on that information.  [employer] will also be able to set up a 53-month interest-free payment  plan if needed.  Applying the monitor   Shave hair from upper left chest.  Hold abrader disc by orange tab. Rub abrader in 40 strokes over the upper left chest as  indicated in your monitor instructions.  Clean area with 4 enclosed alcohol pads. Let dry.  Apply patch as indicated in monitor instructions. Patch will be placed under collarbone on left  side of chest with arrow pointing upward.  Rub patch adhesive wings for 2 minutes. Remove white label marked "1". Remove the white  label marked "2". Rub patch adhesive wings for 2 additional minutes.  While looking in a mirror, press and release button in center of patch. A small green light will  flash 3-4 times. This will be your only indicator that the monitor has been turned on.  Do not shower for the first 24 hours. You may shower after the first 24 hours.  Press the button if you feel a symptom. You will hear a small click. Record Date, Time and  Symptom in the Patient Logbook.  When you are ready to remove the patch, follow instructions on the last 2 pages of Patient  Logbook. Stick patch monitor onto the last page of Patient Logbook.  Place Patient Logbook in the blue and white box. Use locking tab on box and tape box closed  securely. The blue and white box has prepaid postage on it. Please place it in the mailbox as  soon as possible. Your physician should have your test results approximately 7 days after the  monitor has been [facility]  Call [facility] Customer Care at [phone] if you have questions regarding  your ZIO XT patch monitor. Call them immediately if you see an orange light blinking on your  monitor.  If your monitor falls off in less than 4 days, contact our Monitor department at [phone].  If your monitor becomes loose or falls off after 4 days call [employer] at [phone] for  suggestions on securing your monitor    Follow-Up: At [facility], you and your health  needs are our priority.  As part of our continuing mission to provide you with exceptional heart care, we have created designated Provider Care Teams.  These Care Teams include your primary Cardiologist (physician) and Advanced Practice Providers (APPs -  Physician Assistants and Nurse Practitioners) who all work together to provide you with the care you need, when you need it.  We recommend signing up for the patient portal called "MyChart".  Sign up information is provided on this After Visit Summary.  MyChart is used to connect with patients for Virtual Visits (Telemedicine).  Patients are able to view lab/test results, encounter notes, upcoming appointments, etc.  Non-urgent messages can be sent to your provider as well.   To learn more about what you can do with MyChart, go to [email or web address].    Your next appointment:   3 month(s)  Provider:   Robin Searing, NP       Other Instructions

## 2022-06-14 NOTE — Progress Notes (Unsigned)
Enrolled for [employer] to mail a ZIO XT long term holter monitor to the patients address on file.   Dr. Nahser to read. 

## 2022-06-15 ENCOUNTER — Other Ambulatory Visit: Payer: Self-pay

## 2022-06-15 DIAGNOSIS — C679 Malignant neoplasm of bladder, unspecified: Secondary | ICD-10-CM | POA: Diagnosis not present

## 2022-06-15 DIAGNOSIS — I959 Hypotension, unspecified: Secondary | ICD-10-CM | POA: Diagnosis not present

## 2022-06-15 DIAGNOSIS — C7951 Secondary malignant neoplasm of bone: Secondary | ICD-10-CM | POA: Diagnosis not present

## 2022-06-15 DIAGNOSIS — I129 Hypertensive chronic kidney disease with stage 1 through stage 4 chronic kidney disease, or unspecified chronic kidney disease: Secondary | ICD-10-CM | POA: Diagnosis not present

## 2022-06-15 DIAGNOSIS — M84551D Pathological fracture in neoplastic disease, right femur, subsequent encounter for fracture with routine healing: Secondary | ICD-10-CM | POA: Diagnosis not present

## 2022-06-15 DIAGNOSIS — L89322 Pressure ulcer of left buttock, stage 2: Secondary | ICD-10-CM | POA: Diagnosis not present

## 2022-06-15 DIAGNOSIS — C9000 Multiple myeloma not having achieved remission: Secondary | ICD-10-CM | POA: Diagnosis not present

## 2022-06-15 DIAGNOSIS — L89613 Pressure ulcer of right heel, stage 3: Secondary | ICD-10-CM | POA: Diagnosis not present

## 2022-06-15 DIAGNOSIS — N1831 Chronic kidney disease, stage 3a: Secondary | ICD-10-CM | POA: Diagnosis not present

## 2022-06-16 ENCOUNTER — Other Ambulatory Visit (HOSPITAL_BASED_OUTPATIENT_CLINIC_OR_DEPARTMENT_OTHER): Payer: Medicare PPO

## 2022-06-16 DIAGNOSIS — I129 Hypertensive chronic kidney disease with stage 1 through stage 4 chronic kidney disease, or unspecified chronic kidney disease: Secondary | ICD-10-CM | POA: Diagnosis not present

## 2022-06-16 DIAGNOSIS — I498 Other specified cardiac arrhythmias: Secondary | ICD-10-CM

## 2022-06-16 DIAGNOSIS — C9000 Multiple myeloma not having achieved remission: Secondary | ICD-10-CM | POA: Diagnosis not present

## 2022-06-16 DIAGNOSIS — R001 Bradycardia, unspecified: Secondary | ICD-10-CM | POA: Diagnosis not present

## 2022-06-16 DIAGNOSIS — E876 Hypokalemia: Secondary | ICD-10-CM

## 2022-06-16 DIAGNOSIS — M84551D Pathological fracture in neoplastic disease, right femur, subsequent encounter for fracture with routine healing: Secondary | ICD-10-CM | POA: Diagnosis not present

## 2022-06-16 DIAGNOSIS — C7951 Secondary malignant neoplasm of bone: Secondary | ICD-10-CM | POA: Diagnosis not present

## 2022-06-16 DIAGNOSIS — L89322 Pressure ulcer of left buttock, stage 2: Secondary | ICD-10-CM | POA: Diagnosis not present

## 2022-06-16 DIAGNOSIS — L89613 Pressure ulcer of right heel, stage 3: Secondary | ICD-10-CM | POA: Diagnosis not present

## 2022-06-16 DIAGNOSIS — C679 Malignant neoplasm of bladder, unspecified: Secondary | ICD-10-CM | POA: Diagnosis not present

## 2022-06-16 DIAGNOSIS — I959 Hypotension, unspecified: Secondary | ICD-10-CM | POA: Diagnosis not present

## 2022-06-16 DIAGNOSIS — N1831 Chronic kidney disease, stage 3a: Secondary | ICD-10-CM | POA: Diagnosis not present

## 2022-06-16 LAB — MULTIPLE MYELOMA PANEL, SERUM
Albumin SerPl Elph-Mcnc: 3.2 g/dL (ref 2.9–4.4)
Albumin/Glob SerPl: 1.2 (ref 0.7–1.7)
Alpha 1: 0.4 g/dL (ref 0.0–0.4)
Alpha2 Glob SerPl Elph-Mcnc: 1 g/dL (ref 0.4–1.0)
B-Globulin SerPl Elph-Mcnc: 0.6 g/dL — ABNORMAL LOW (ref 0.7–1.3)
Gamma Glob SerPl Elph-Mcnc: 0.8 g/dL (ref 0.4–1.8)
Globulin, Total: 2.7 g/dL (ref 2.2–3.9)
IgA: 26 mg/dL — ABNORMAL LOW (ref 61–437)
IgG (Immunoglobin G), Serum: 950 mg/dL (ref 603–1613)
IgM (Immunoglobulin M), Srm: 12 mg/dL — ABNORMAL LOW (ref 15–143)
M Protein SerPl Elph-Mcnc: 0.5 g/dL — ABNORMAL HIGH
Total Protein ELP: 5.9 g/dL — ABNORMAL LOW (ref 6.0–8.5)

## 2022-06-18 ENCOUNTER — Inpatient Hospital Stay: Payer: Medicare PPO

## 2022-06-18 ENCOUNTER — Inpatient Hospital Stay: Payer: Medicare PPO | Admitting: Physician Assistant

## 2022-06-18 ENCOUNTER — Inpatient Hospital Stay (HOSPITAL_BASED_OUTPATIENT_CLINIC_OR_DEPARTMENT_OTHER): Payer: Medicare PPO | Admitting: Hematology

## 2022-06-18 ENCOUNTER — Other Ambulatory Visit: Payer: Medicare PPO

## 2022-06-18 ENCOUNTER — Other Ambulatory Visit: Payer: Self-pay

## 2022-06-18 VITALS — BP 140/59 | HR 68

## 2022-06-18 VITALS — BP 134/75 | HR 63 | Temp 97.3°F | Resp 13 | Wt 208.7 lb

## 2022-06-18 DIAGNOSIS — Z79624 Long term (current) use of inhibitors of nucleotide synthesis: Secondary | ICD-10-CM | POA: Diagnosis not present

## 2022-06-18 DIAGNOSIS — C9000 Multiple myeloma not having achieved remission: Secondary | ICD-10-CM

## 2022-06-18 DIAGNOSIS — E878 Other disorders of electrolyte and fluid balance, not elsewhere classified: Secondary | ICD-10-CM | POA: Diagnosis not present

## 2022-06-18 DIAGNOSIS — E876 Hypokalemia: Secondary | ICD-10-CM

## 2022-06-18 DIAGNOSIS — Z5112 Encounter for antineoplastic immunotherapy: Secondary | ICD-10-CM | POA: Diagnosis not present

## 2022-06-18 DIAGNOSIS — Z5111 Encounter for antineoplastic chemotherapy: Secondary | ICD-10-CM | POA: Diagnosis not present

## 2022-06-18 DIAGNOSIS — Z7189 Other specified counseling: Secondary | ICD-10-CM

## 2022-06-18 DIAGNOSIS — R4781 Slurred speech: Secondary | ICD-10-CM | POA: Diagnosis not present

## 2022-06-18 DIAGNOSIS — N183 Chronic kidney disease, stage 3 unspecified: Secondary | ICD-10-CM | POA: Diagnosis not present

## 2022-06-18 DIAGNOSIS — E8581 Light chain (AL) amyloidosis: Secondary | ICD-10-CM | POA: Diagnosis not present

## 2022-06-18 DIAGNOSIS — C419 Malignant neoplasm of bone and articular cartilage, unspecified: Secondary | ICD-10-CM

## 2022-06-18 DIAGNOSIS — Z79899 Other long term (current) drug therapy: Secondary | ICD-10-CM | POA: Diagnosis not present

## 2022-06-18 DIAGNOSIS — D649 Anemia, unspecified: Secondary | ICD-10-CM | POA: Diagnosis not present

## 2022-06-18 LAB — CBC WITH DIFFERENTIAL (CANCER CENTER ONLY)
Abs Immature Granulocytes: 0.01 10*3/uL (ref 0.00–0.07)
Basophils Absolute: 0 10*3/uL (ref 0.0–0.1)
Basophils Relative: 0 %
Eosinophils Absolute: 0.1 10*3/uL (ref 0.0–0.5)
Eosinophils Relative: 2 %
HCT: 27.2 % — ABNORMAL LOW (ref 39.0–52.0)
Hemoglobin: 9.2 g/dL — ABNORMAL LOW (ref 13.0–17.0)
Immature Granulocytes: 0 %
Lymphocytes Relative: 24 %
Lymphs Abs: 1.2 10*3/uL (ref 0.7–4.0)
MCH: 30.9 pg (ref 26.0–34.0)
MCHC: 33.8 g/dL (ref 30.0–36.0)
MCV: 91.3 fL (ref 80.0–100.0)
Monocytes Absolute: 1 10*3/uL (ref 0.1–1.0)
Monocytes Relative: 19 %
Neutro Abs: 2.7 10*3/uL (ref 1.7–7.7)
Neutrophils Relative %: 55 %
Platelet Count: 245 10*3/uL (ref 150–400)
RBC: 2.98 MIL/uL — ABNORMAL LOW (ref 4.22–5.81)
RDW: 19.1 % — ABNORMAL HIGH (ref 11.5–15.5)
WBC Count: 4.9 10*3/uL (ref 4.0–10.5)
nRBC: 0 % (ref 0.0–0.2)

## 2022-06-18 LAB — CMP (CANCER CENTER ONLY)
ALT: 10 U/L (ref 0–44)
AST: 21 U/L (ref 15–41)
Albumin: 3.6 g/dL (ref 3.5–5.0)
Alkaline Phosphatase: 127 U/L — ABNORMAL HIGH (ref 38–126)
Anion gap: 8 (ref 5–15)
BUN: 11 mg/dL (ref 8–23)
CO2: 23 mmol/L (ref 22–32)
Calcium: 9.4 mg/dL (ref 8.9–10.3)
Chloride: 112 mmol/L — ABNORMAL HIGH (ref 98–111)
Creatinine: 1.22 mg/dL (ref 0.61–1.24)
GFR, Estimated: 60 mL/min — ABNORMAL LOW (ref >60)
Glucose, Bld: 118 mg/dL — ABNORMAL HIGH (ref 70–99)
Potassium: 3 mmol/L — ABNORMAL LOW (ref 3.5–5.1)
Sodium: 143 mmol/L (ref 135–145)
Total Bilirubin: 0.8 mg/dL (ref 0.3–1.2)
Total Protein: 6.2 g/dL — ABNORMAL LOW (ref 6.5–8.1)

## 2022-06-18 MED ORDER — DENOSUMAB 120 MG/1.7ML ~~LOC~~ SOLN
120.0000 mg | Freq: Once | SUBCUTANEOUS | Status: AC
  Administered 2022-06-18: 120 mg via SUBCUTANEOUS
  Filled 2022-06-18: qty 1.7

## 2022-06-18 MED ORDER — DEXAMETHASONE 4 MG PO TABS
12.0000 mg | ORAL_TABLET | Freq: Once | ORAL | Status: AC
  Administered 2022-06-18: 12 mg via ORAL
  Filled 2022-06-18: qty 3

## 2022-06-18 MED ORDER — DARATUMUMAB-HYALURONIDASE-FIHJ 1800-30000 MG-UT/15ML ~~LOC~~ SOLN
1800.0000 mg | Freq: Once | SUBCUTANEOUS | Status: AC
  Administered 2022-06-18: 1800 mg via SUBCUTANEOUS
  Filled 2022-06-18: qty 15

## 2022-06-18 MED ORDER — DIPHENHYDRAMINE HCL 25 MG PO CAPS
25.0000 mg | ORAL_CAPSULE | Freq: Once | ORAL | Status: AC
  Administered 2022-06-18: 25 mg via ORAL
  Filled 2022-06-18: qty 1

## 2022-06-18 MED ORDER — ACETAMINOPHEN 325 MG PO TABS
650.0000 mg | ORAL_TABLET | Freq: Once | ORAL | Status: AC
  Administered 2022-06-18: 650 mg via ORAL
  Filled 2022-06-18: qty 2

## 2022-06-18 MED ORDER — FAMOTIDINE 20 MG PO TABS
40.0000 mg | ORAL_TABLET | Freq: Once | ORAL | Status: AC
  Administered 2022-06-18: 40 mg via ORAL
  Filled 2022-06-18: qty 2

## 2022-06-18 NOTE — Progress Notes (Signed)
Patient seen by Dr. Addison Naegeli are within treatment parameters.  Labs reviewed: and are within treatment parameters.. Dr Candise Che aware K: 3.0  Per physician team, patient is ready for treatment. Please note that modifications are being made to the treatment plan including No velcade today. Pt to get Dara only
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
## 2022-06-18 NOTE — Patient Instructions (Signed)
[facility]  Discharge Instructions: Thank you for choosing [facility] to provide your oncology and hematology care.   If you have a lab appointment with the Cancer Center, please go directly to the Cancer Center and check in at the registration area.   Wear comfortable clothing and clothing appropriate for easy access to any Portacath or PICC line.   We strive to give you quality time with your provider. You may need to reschedule your appointment if you arrive late (15 or more minutes).  Arriving late affects you and other patients whose appointments are after yours.  Also, if you miss three or more appointments without notifying the office, you may be dismissed from the clinic at the provider's discretion.      For prescription refill requests, have your pharmacy contact our office and allow 72 hours for refills to be completed.    Today you received the following chemotherapy and/or immunotherapy agents: Darzalex Faspro      To help prevent nausea and vomiting after your treatment, we encourage you to take your nausea medication as directed.  BELOW ARE SYMPTOMS THAT SHOULD BE REPORTED IMMEDIATELY: *FEVER GREATER THAN 100.4 F (38 C) OR HIGHER *CHILLS OR SWEATING *NAUSEA AND VOMITING THAT IS NOT CONTROLLED WITH YOUR NAUSEA MEDICATION *UNUSUAL SHORTNESS OF BREATH *UNUSUAL BRUISING OR BLEEDING *URINARY PROBLEMS (pain or burning when urinating, or frequent urination) *BOWEL PROBLEMS (unusual diarrhea, constipation, pain near the anus) TENDERNESS IN MOUTH AND THROAT WITH OR WITHOUT PRESENCE OF ULCERS (sore throat, sores in mouth, or a toothache) UNUSUAL RASH, SWELLING OR PAIN  UNUSUAL VAGINAL DISCHARGE OR ITCHING   Items with * indicate a potential emergency and should be followed up as soon as possible or go to the Emergency Department if any problems should occur.  Please show the CHEMOTHERAPY ALERT CARD or IMMUNOTHERAPY ALERT CARD at  check-in to the Emergency Department and triage nurse.  Should you have questions after your visit or need to cancel or reschedule your appointment, please contact [facility]  Dept: [phone]  and follow the prompts.  Office hours are 8:00 a.m. to 4:30 p.m. Monday - Friday. Please note that voicemails left after 4:00 p.m. may not be returned until the following business day.  We are closed weekends and major holidays. You have access to a nurse at all times for urgent questions. Please call the main number to the clinic Dept: [phone] and follow the prompts.   For any non-urgent questions, you may also contact your provider using MyChart. We now offer e-Visits for anyone 18 and older to request care online for non-urgent symptoms. For details visit [email or web address].   Also download the MyChart app! Go to the app store, search "MyChart", open the app, select [place] [facility], and log in with your MyChart username and password.  Denosumab Injection (Oncology) What is this medication? DENOSUMAB (den oh SUE mab) prevents weakened bones caused by cancer. It may also be used to treat noncancerous bone tumors that cannot be removed by surgery. It can also be used to treat high calcium levels in the blood caused by cancer. It works by blocking a protein that causes bones to break down quickly. This slows down the release of calcium from bones, which lowers calcium levels in your blood. It also makes your bones stronger and less likely to break (fracture). This medicine may be used for other purposes; ask your health care provider or   pharmacist if you have questions. COMMON BRAND NAME(S): XGEVA What should I tell my care team before I take this medication? They need to know if you have any of these conditions: Dental disease Having surgery or tooth extraction Infection Kidney disease Low levels of calcium or vitamin D in the blood Malnutrition On  hemodialysis Skin conditions or sensitivity Thyroid or parathyroid disease An unusual reaction to denosumab, other medications, foods, dyes, or preservatives Pregnant or trying to get pregnant Breast-feeding How should I use this medication? This medication is for injection under the skin. It is given by your care team in a hospital or clinic setting. A special MedGuide will be given to you before each treatment. Be sure to read this information carefully each time. Talk to your care team about the use of this medication in children. While it may be prescribed for children as young as 13 years for selected conditions, precautions do apply. Overdosage: If you think you have taken too much of this medicine contact a poison control center or emergency room at once. NOTE: This medicine is only for you. Do not share this medicine with others. What if I miss a dose? Keep appointments for follow-up doses. It is important not to miss your dose. Call your care team if you are unable to keep an appointment. What may interact with this medication? Do not take this medication with any of the following: Other medications containing denosumab This medication may also interact with the following: Medications that lower your chance of fighting infection Steroid medications, such as prednisone or cortisone This list may not describe all possible interactions. Give your health care provider a list of all the medicines, herbs, non-prescription drugs, or dietary supplements you use. Also tell them if you smoke, drink alcohol, or use illegal drugs. Some items may interact with your medicine. What should I watch for while using this medication? Your condition will be monitored carefully while you are receiving this medication. You may need blood work while taking this medication. This medication may increase your risk of getting an infection. Call your care team for advice if you get a fever, chills, sore throat,  or other symptoms of a cold or flu. Do not treat yourself. Try to avoid being around people who are sick. You should make sure you get enough calcium and vitamin D while you are taking this medication, unless your care team tells you not to. Discuss the foods you eat and the vitamins you take with your care team. Some people who take this medication have severe bone, joint, or muscle pain. This medication may also increase your risk for jaw problems or a broken thigh bone. Tell your care team right away if you have severe pain in your jaw, bones, joints, or muscles. Tell your care team if you have any pain that does not go away or that gets worse. Talk to your care team if you may be pregnant. Serious birth defects can occur if you take this medication during pregnancy and for 5 months after the last dose. You will need a negative pregnancy test before starting this medication. Contraception is recommended while taking this medication and for 5 months after the last dose. Your care team can help you find the option that works for you. What side effects may I notice from receiving this medication? Side effects that you should report to your care team as soon as possible: Allergic reactions--skin rash, itching, hives, swelling of the face, lips, tongue, or   throat Bone, joint, or muscle pain Low calcium level--muscle pain or cramps, confusion, tingling, or numbness in the hands or feet Osteonecrosis of the jaw--pain, swelling, or redness in the mouth, numbness of the jaw, poor healing after dental work, unusual discharge from the mouth, visible bones in the mouth Side effects that usually do not require medical attention (report to your care team if they continue or are bothersome): Cough Diarrhea Fatigue Headache Nausea This list may not describe all possible side effects. Call your doctor for medical advice about side effects. You may report side effects to FDA at 1-800-FDA-1088. Where should I keep  my medication? This medication is given in a hospital or clinic. It will not be stored at home. NOTE: This sheet is a summary. It may not cover all possible information. If you have questions about this medicine, talk to your doctor, pharmacist, or health care provider.  2023 Elsevier/Gold Standard (2021-07-06 00:00:00)   

## 2022-06-18 NOTE — Progress Notes (Unsigned)
Patient seen by Dr. Addison Naegeli are {Blank single:19197::"not all within treatment parameters. ***","within treatment parameters."}  Labs reviewed: {Blank single:19197::"and are not all within treatment parameters. ***","and are within treatment parameters."}  Per physician team, {Blank single:19197::"patient is ready for treatment. Please note that modifications are being made to the treatment plan including ***","patient will not be receiving treatment today.","patient is ready for treatment and there are NO modifications to the treatment plan."}

## 2022-06-21 LAB — KAPPA/LAMBDA LIGHT CHAINS
Kappa free light chain: 19.9 mg/L — ABNORMAL HIGH (ref 3.3–19.4)
Kappa, lambda light chain ratio: 1.44 (ref 0.26–1.65)
Lambda free light chains: 13.8 mg/L (ref 5.7–26.3)

## 2022-06-22 DIAGNOSIS — C679 Malignant neoplasm of bladder, unspecified: Secondary | ICD-10-CM | POA: Diagnosis not present

## 2022-06-22 DIAGNOSIS — C7951 Secondary malignant neoplasm of bone: Secondary | ICD-10-CM | POA: Diagnosis not present

## 2022-06-22 DIAGNOSIS — L89322 Pressure ulcer of left buttock, stage 2: Secondary | ICD-10-CM | POA: Diagnosis not present

## 2022-06-22 DIAGNOSIS — I959 Hypotension, unspecified: Secondary | ICD-10-CM | POA: Diagnosis not present

## 2022-06-22 DIAGNOSIS — I129 Hypertensive chronic kidney disease with stage 1 through stage 4 chronic kidney disease, or unspecified chronic kidney disease: Secondary | ICD-10-CM | POA: Diagnosis not present

## 2022-06-22 DIAGNOSIS — M84551D Pathological fracture in neoplastic disease, right femur, subsequent encounter for fracture with routine healing: Secondary | ICD-10-CM | POA: Diagnosis not present

## 2022-06-22 DIAGNOSIS — N1831 Chronic kidney disease, stage 3a: Secondary | ICD-10-CM | POA: Diagnosis not present

## 2022-06-22 DIAGNOSIS — C9000 Multiple myeloma not having achieved remission: Secondary | ICD-10-CM | POA: Diagnosis not present

## 2022-06-22 DIAGNOSIS — L89613 Pressure ulcer of right heel, stage 3: Secondary | ICD-10-CM | POA: Diagnosis not present

## 2022-06-23 DIAGNOSIS — C9000 Multiple myeloma not having achieved remission: Secondary | ICD-10-CM | POA: Diagnosis not present

## 2022-06-23 DIAGNOSIS — C679 Malignant neoplasm of bladder, unspecified: Secondary | ICD-10-CM | POA: Diagnosis not present

## 2022-06-23 DIAGNOSIS — L89613 Pressure ulcer of right heel, stage 3: Secondary | ICD-10-CM | POA: Diagnosis not present

## 2022-06-23 DIAGNOSIS — M84551D Pathological fracture in neoplastic disease, right femur, subsequent encounter for fracture with routine healing: Secondary | ICD-10-CM | POA: Diagnosis not present

## 2022-06-23 DIAGNOSIS — I129 Hypertensive chronic kidney disease with stage 1 through stage 4 chronic kidney disease, or unspecified chronic kidney disease: Secondary | ICD-10-CM | POA: Diagnosis not present

## 2022-06-23 DIAGNOSIS — I959 Hypotension, unspecified: Secondary | ICD-10-CM | POA: Diagnosis not present

## 2022-06-23 DIAGNOSIS — L89322 Pressure ulcer of left buttock, stage 2: Secondary | ICD-10-CM | POA: Diagnosis not present

## 2022-06-23 DIAGNOSIS — N1831 Chronic kidney disease, stage 3a: Secondary | ICD-10-CM | POA: Diagnosis not present

## 2022-06-23 DIAGNOSIS — C7951 Secondary malignant neoplasm of bone: Secondary | ICD-10-CM | POA: Diagnosis not present

## 2022-06-24 ENCOUNTER — Encounter: Payer: Self-pay | Admitting: Hematology

## 2022-06-24 DIAGNOSIS — L89322 Pressure ulcer of left buttock, stage 2: Secondary | ICD-10-CM | POA: Diagnosis not present

## 2022-06-24 DIAGNOSIS — M84551D Pathological fracture in neoplastic disease, right femur, subsequent encounter for fracture with routine healing: Secondary | ICD-10-CM | POA: Diagnosis not present

## 2022-06-24 DIAGNOSIS — I959 Hypotension, unspecified: Secondary | ICD-10-CM | POA: Diagnosis not present

## 2022-06-24 DIAGNOSIS — C7951 Secondary malignant neoplasm of bone: Secondary | ICD-10-CM | POA: Diagnosis not present

## 2022-06-24 DIAGNOSIS — L89613 Pressure ulcer of right heel, stage 3: Secondary | ICD-10-CM | POA: Diagnosis not present

## 2022-06-24 DIAGNOSIS — I129 Hypertensive chronic kidney disease with stage 1 through stage 4 chronic kidney disease, or unspecified chronic kidney disease: Secondary | ICD-10-CM | POA: Diagnosis not present

## 2022-06-24 DIAGNOSIS — C9000 Multiple myeloma not having achieved remission: Secondary | ICD-10-CM | POA: Diagnosis not present

## 2022-06-24 DIAGNOSIS — N1831 Chronic kidney disease, stage 3a: Secondary | ICD-10-CM | POA: Diagnosis not present

## 2022-06-24 DIAGNOSIS — C679 Malignant neoplasm of bladder, unspecified: Secondary | ICD-10-CM | POA: Diagnosis not present

## 2022-06-24 LAB — MULTIPLE MYELOMA PANEL, SERUM
Albumin SerPl Elph-Mcnc: 3.4 g/dL (ref 2.9–4.4)
Albumin/Glob SerPl: 1.4 (ref 0.7–1.7)
Alpha 1: 0.3 g/dL (ref 0.0–0.4)
Alpha2 Glob SerPl Elph-Mcnc: 0.8 g/dL (ref 0.4–1.0)
B-Globulin SerPl Elph-Mcnc: 0.6 g/dL — ABNORMAL LOW (ref 0.7–1.3)
Gamma Glob SerPl Elph-Mcnc: 0.8 g/dL (ref 0.4–1.8)
Globulin, Total: 2.6 g/dL (ref 2.2–3.9)
IgA: 31 mg/dL — ABNORMAL LOW (ref 61–437)
IgG (Immunoglobin G), Serum: 954 mg/dL (ref 603–1613)
IgM (Immunoglobulin M), Srm: 20 mg/dL (ref 15–143)
M Protein SerPl Elph-Mcnc: 0.4 g/dL — ABNORMAL HIGH
Total Protein ELP: 6 g/dL (ref 6.0–8.5)
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
## 2022-06-25 ENCOUNTER — Inpatient Hospital Stay: Payer: Medicare PPO

## 2022-06-25 ENCOUNTER — Ambulatory Visit (HOSPITAL_COMMUNITY): Payer: Medicare PPO

## 2022-06-25 ENCOUNTER — Other Ambulatory Visit: Payer: Self-pay

## 2022-06-25 ENCOUNTER — Inpatient Hospital Stay (HOSPITAL_BASED_OUTPATIENT_CLINIC_OR_DEPARTMENT_OTHER): Payer: Medicare PPO | Admitting: Hematology

## 2022-06-25 VITALS — BP 158/78 | HR 76 | Resp 18

## 2022-06-25 VITALS — BP 136/90 | HR 67 | Temp 97.5°F | Resp 18

## 2022-06-25 DIAGNOSIS — Z7189 Other specified counseling: Secondary | ICD-10-CM | POA: Diagnosis not present

## 2022-06-25 DIAGNOSIS — C9000 Multiple myeloma not having achieved remission: Secondary | ICD-10-CM

## 2022-06-25 DIAGNOSIS — Z79899 Other long term (current) drug therapy: Secondary | ICD-10-CM | POA: Diagnosis not present

## 2022-06-25 DIAGNOSIS — E8581 Light chain (AL) amyloidosis: Secondary | ICD-10-CM | POA: Diagnosis not present

## 2022-06-25 DIAGNOSIS — D649 Anemia, unspecified: Secondary | ICD-10-CM | POA: Diagnosis not present

## 2022-06-25 DIAGNOSIS — N183 Chronic kidney disease, stage 3 unspecified: Secondary | ICD-10-CM | POA: Diagnosis not present

## 2022-06-25 DIAGNOSIS — E876 Hypokalemia: Secondary | ICD-10-CM

## 2022-06-25 DIAGNOSIS — Z79624 Long term (current) use of inhibitors of nucleotide synthesis: Secondary | ICD-10-CM | POA: Diagnosis not present

## 2022-06-25 DIAGNOSIS — R4781 Slurred speech: Secondary | ICD-10-CM | POA: Diagnosis not present

## 2022-06-25 DIAGNOSIS — Z5112 Encounter for antineoplastic immunotherapy: Secondary | ICD-10-CM | POA: Diagnosis not present

## 2022-06-25 DIAGNOSIS — E878 Other disorders of electrolyte and fluid balance, not elsewhere classified: Secondary | ICD-10-CM | POA: Diagnosis not present

## 2022-06-25 LAB — CBC WITH DIFFERENTIAL (CANCER CENTER ONLY)
Abs Immature Granulocytes: 0 10*3/uL (ref 0.00–0.07)
Basophils Absolute: 0 10*3/uL (ref 0.0–0.1)
Basophils Relative: 0 %
Eosinophils Absolute: 0.1 10*3/uL (ref 0.0–0.5)
Eosinophils Relative: 2 %
HCT: 26.4 % — ABNORMAL LOW (ref 39.0–52.0)
Hemoglobin: 9.1 g/dL — ABNORMAL LOW (ref 13.0–17.0)
Immature Granulocytes: 0 %
Lymphocytes Relative: 25 %
Lymphs Abs: 0.9 10*3/uL (ref 0.7–4.0)
MCH: 31.5 pg (ref 26.0–34.0)
MCHC: 34.5 g/dL (ref 30.0–36.0)
MCV: 91.3 fL (ref 80.0–100.0)
Monocytes Absolute: 0.7 10*3/uL (ref 0.1–1.0)
Monocytes Relative: 20 %
Neutro Abs: 1.8 10*3/uL (ref 1.7–7.7)
Neutrophils Relative %: 53 %
Platelet Count: 245 10*3/uL (ref 150–400)
RBC: 2.89 MIL/uL — ABNORMAL LOW (ref 4.22–5.81)
RDW: 19.4 % — ABNORMAL HIGH (ref 11.5–15.5)
WBC Count: 3.5 10*3/uL — ABNORMAL LOW (ref 4.0–10.5)
nRBC: 0 % (ref 0.0–0.2)

## 2022-06-25 LAB — CMP (CANCER CENTER ONLY)
ALT: 10 U/L (ref 0–44)
AST: 24 U/L (ref 15–41)
Albumin: 3.7 g/dL (ref 3.5–5.0)
Alkaline Phosphatase: 115 U/L (ref 38–126)
Anion gap: 11 (ref 5–15)
BUN: 7 mg/dL — ABNORMAL LOW (ref 8–23)
CO2: 18 mmol/L — ABNORMAL LOW (ref 22–32)
Calcium: 7.7 mg/dL — ABNORMAL LOW (ref 8.9–10.3)
Chloride: 113 mmol/L — ABNORMAL HIGH (ref 98–111)
Creatinine: 1.25 mg/dL — ABNORMAL HIGH (ref 0.61–1.24)
GFR, Estimated: 58 mL/min — ABNORMAL LOW (ref >60)
Glucose, Bld: 108 mg/dL — ABNORMAL HIGH (ref 70–99)
Potassium: 2.7 mmol/L — CL (ref 3.5–5.1)
Sodium: 142 mmol/L (ref 135–145)
Total Bilirubin: 0.9 mg/dL (ref 0.3–1.2)
Total Protein: 6 g/dL — ABNORMAL LOW (ref 6.5–8.1)

## 2022-06-25 MED ORDER — FAMOTIDINE 20 MG PO TABS
40.0000 mg | ORAL_TABLET | Freq: Once | ORAL | Status: AC
  Administered 2022-06-25: 40 mg via ORAL
  Filled 2022-06-25: qty 2

## 2022-06-25 MED ORDER — POTASSIUM CHLORIDE 20 MEQ PO PACK: 40.0000 meq | PACK | ORAL | Status: DC

## 2022-06-25 MED ORDER — DARATUMUMAB-HYALURONIDASE-FIHJ 1800-30000 MG-UT/15ML ~~LOC~~ SOLN
1800.0000 mg | Freq: Once | SUBCUTANEOUS | Status: AC
  Administered 2022-06-25: 1800 mg via SUBCUTANEOUS
  Filled 2022-06-25: qty 15

## 2022-06-25 MED ORDER — POTASSIUM CHLORIDE CRYS ER 20 MEQ PO TBCR
40.0000 meq | EXTENDED_RELEASE_TABLET | ORAL | Status: AC
  Administered 2022-06-25 (×2): 40 meq via ORAL
  Filled 2022-06-25 (×2): qty 2

## 2022-06-25 MED ORDER — ACYCLOVIR 400 MG PO TABS
400.0000 mg | ORAL_TABLET | Freq: Every day | ORAL | 5 refills | Status: DC
Start: 2022-06-25 — End: 2022-09-10

## 2022-06-25 MED ORDER — DIPHENHYDRAMINE HCL 25 MG PO CAPS
25.0000 mg | ORAL_CAPSULE | Freq: Once | ORAL | Status: AC
  Administered 2022-06-25: 25 mg via ORAL
  Filled 2022-06-25: qty 1

## 2022-06-25 MED ORDER — POTASSIUM CHLORIDE CRYS ER 20 MEQ PO TBCR: 40.0000 meq | EXTENDED_RELEASE_TABLET | Freq: Two times a day (BID) | ORAL | 0 refills | Status: DC

## 2022-06-25 MED ORDER — VITAMIN D (ERGOCALCIFEROL) 1.25 MG (50000 UNIT) PO CAPS
50000.0000 [IU] | ORAL_CAPSULE | ORAL | 0 refills | Status: DC
Start: 2022-06-25 — End: 2022-07-09

## 2022-06-25 MED ORDER — DEXAMETHASONE 4 MG PO TABS
8.0000 mg | ORAL_TABLET | Freq: Once | ORAL | Status: AC
  Administered 2022-06-25: 8 mg via ORAL
  Filled 2022-06-25: qty 2

## 2022-06-25 MED ORDER — ACETAMINOPHEN 325 MG PO TABS
650.0000 mg | ORAL_TABLET | Freq: Once | ORAL | Status: AC
  Administered 2022-06-25: 650 mg via ORAL
  Filled 2022-06-25: qty 2

## 2022-06-25 NOTE — Progress Notes (Signed)
Patient seen by Dr. Addison Naegeli are within treatment parameters.  Labs reviewed: and are not all within treatment parameters. Dr Candise Che aware K: 2.7,  CR: 1.25  Per physician team, patient is ready for treatment. Please note that modifications are being made to the treatment plan including Pt to get oral K today in infusion/ orders in signed and held

## 2022-06-25 NOTE — Patient Instructions (Signed)
[facility]  Discharge Instructions: Thank you for choosing [facility] to provide your oncology and hematology care.   If you have a lab appointment with the Cancer Center, please go directly to the Cancer Center and check in at the registration area.   Wear comfortable clothing and clothing appropriate for easy access to any Portacath or PICC line.   We strive to give you quality time with your provider. You may need to reschedule your appointment if you arrive late (15 or more minutes).  Arriving late affects you and other patients whose appointments are after yours.  Also, if you miss three or more appointments without notifying the office, you may be dismissed from the clinic at the provider's discretion.      For prescription refill requests, have your pharmacy contact our office and allow 72 hours for refills to be completed.    Today you received the following chemotherapy and/or immunotherapy agents Darzalex Faspro      To help prevent nausea and vomiting after your treatment, we encourage you to take your nausea medication as directed.  BELOW ARE SYMPTOMS THAT SHOULD BE REPORTED IMMEDIATELY: *FEVER GREATER THAN 100.4 F (38 C) OR HIGHER *CHILLS OR SWEATING *NAUSEA AND VOMITING THAT IS NOT CONTROLLED WITH YOUR NAUSEA MEDICATION *UNUSUAL SHORTNESS OF BREATH *UNUSUAL BRUISING OR BLEEDING *URINARY PROBLEMS (pain or burning when urinating, or frequent urination) *BOWEL PROBLEMS (unusual diarrhea, constipation, pain near the anus) TENDERNESS IN MOUTH AND THROAT WITH OR WITHOUT PRESENCE OF ULCERS (sore throat, sores in mouth, or a toothache) UNUSUAL RASH, SWELLING OR PAIN  UNUSUAL VAGINAL DISCHARGE OR ITCHING   Items with * indicate a potential emergency and should be followed up as soon as possible or go to the Emergency Department if any problems should occur.  Please show the CHEMOTHERAPY ALERT CARD or IMMUNOTHERAPY ALERT CARD at  check-in to the Emergency Department and triage nurse.  Should you have questions after your visit or need to cancel or reschedule your appointment, please contact [facility]  Dept: [phone]  and follow the prompts.  Office hours are 8:00 a.m. to 4:30 p.m. Monday - Friday. Please note that voicemails left after 4:00 p.m. may not be returned until the following business day.  We are closed weekends and major holidays. You have access to a nurse at all times for urgent questions. Please call the main number to the clinic Dept: [phone] and follow the prompts.   For any non-urgent questions, you may also contact your provider using MyChart. We now offer e-Visits for anyone 18 and older to request care online for non-urgent symptoms. For details visit [email or web address].   Also download the MyChart app! Go to the app store, search "MyChart", open the app, select , and log in with your MyChart username and password.  

## 2022-06-28 DIAGNOSIS — C7951 Secondary malignant neoplasm of bone: Secondary | ICD-10-CM | POA: Diagnosis not present

## 2022-06-28 DIAGNOSIS — I129 Hypertensive chronic kidney disease with stage 1 through stage 4 chronic kidney disease, or unspecified chronic kidney disease: Secondary | ICD-10-CM | POA: Diagnosis not present

## 2022-06-28 DIAGNOSIS — L89613 Pressure ulcer of right heel, stage 3: Secondary | ICD-10-CM | POA: Diagnosis not present

## 2022-06-28 DIAGNOSIS — N1831 Chronic kidney disease, stage 3a: Secondary | ICD-10-CM | POA: Diagnosis not present

## 2022-06-28 DIAGNOSIS — L89322 Pressure ulcer of left buttock, stage 2: Secondary | ICD-10-CM | POA: Diagnosis not present

## 2022-06-28 DIAGNOSIS — M84551D Pathological fracture in neoplastic disease, right femur, subsequent encounter for fracture with routine healing: Secondary | ICD-10-CM | POA: Diagnosis not present

## 2022-06-28 DIAGNOSIS — I959 Hypotension, unspecified: Secondary | ICD-10-CM | POA: Diagnosis not present

## 2022-06-28 DIAGNOSIS — C679 Malignant neoplasm of bladder, unspecified: Secondary | ICD-10-CM | POA: Diagnosis not present

## 2022-06-28 DIAGNOSIS — C9000 Multiple myeloma not having achieved remission: Secondary | ICD-10-CM | POA: Diagnosis not present

## 2022-06-30 DIAGNOSIS — C9000 Multiple myeloma not having achieved remission: Secondary | ICD-10-CM | POA: Diagnosis not present

## 2022-06-30 DIAGNOSIS — C679 Malignant neoplasm of bladder, unspecified: Secondary | ICD-10-CM | POA: Diagnosis not present

## 2022-06-30 DIAGNOSIS — M84551D Pathological fracture in neoplastic disease, right femur, subsequent encounter for fracture with routine healing: Secondary | ICD-10-CM | POA: Diagnosis not present

## 2022-06-30 DIAGNOSIS — N1831 Chronic kidney disease, stage 3a: Secondary | ICD-10-CM | POA: Diagnosis not present

## 2022-06-30 DIAGNOSIS — L89322 Pressure ulcer of left buttock, stage 2: Secondary | ICD-10-CM | POA: Diagnosis not present

## 2022-06-30 DIAGNOSIS — C7951 Secondary malignant neoplasm of bone: Secondary | ICD-10-CM | POA: Diagnosis not present

## 2022-06-30 DIAGNOSIS — I129 Hypertensive chronic kidney disease with stage 1 through stage 4 chronic kidney disease, or unspecified chronic kidney disease: Secondary | ICD-10-CM | POA: Diagnosis not present

## 2022-06-30 DIAGNOSIS — L89613 Pressure ulcer of right heel, stage 3: Secondary | ICD-10-CM | POA: Diagnosis not present

## 2022-06-30 DIAGNOSIS — I959 Hypotension, unspecified: Secondary | ICD-10-CM | POA: Diagnosis not present

## 2022-07-01 ENCOUNTER — Ambulatory Visit (HOSPITAL_COMMUNITY)
Admission: RE | Admit: 2022-07-01 | Discharge: 2022-07-01 | Disposition: A | Discharge status: Home / Self Care | Payer: Medicare PPO | Source: Ambulatory Visit | Attending: Physician Assistant | Admitting: Physician Assistant

## 2022-07-01 DIAGNOSIS — R4781 Slurred speech: Secondary | ICD-10-CM | POA: Diagnosis not present

## 2022-07-01 DIAGNOSIS — C679 Malignant neoplasm of bladder, unspecified: Secondary | ICD-10-CM | POA: Diagnosis not present

## 2022-07-01 DIAGNOSIS — L89613 Pressure ulcer of right heel, stage 3: Secondary | ICD-10-CM | POA: Diagnosis not present

## 2022-07-01 DIAGNOSIS — N1831 Chronic kidney disease, stage 3a: Secondary | ICD-10-CM | POA: Diagnosis not present

## 2022-07-01 DIAGNOSIS — I959 Hypotension, unspecified: Secondary | ICD-10-CM | POA: Diagnosis not present

## 2022-07-01 DIAGNOSIS — C7951 Secondary malignant neoplasm of bone: Secondary | ICD-10-CM | POA: Diagnosis not present

## 2022-07-01 DIAGNOSIS — I129 Hypertensive chronic kidney disease with stage 1 through stage 4 chronic kidney disease, or unspecified chronic kidney disease: Secondary | ICD-10-CM | POA: Diagnosis not present

## 2022-07-01 DIAGNOSIS — L89322 Pressure ulcer of left buttock, stage 2: Secondary | ICD-10-CM | POA: Diagnosis not present

## 2022-07-01 DIAGNOSIS — M84551D Pathological fracture in neoplastic disease, right femur, subsequent encounter for fracture with routine healing: Secondary | ICD-10-CM | POA: Diagnosis not present

## 2022-07-01 DIAGNOSIS — C9000 Multiple myeloma not having achieved remission: Secondary | ICD-10-CM | POA: Diagnosis not present

## 2022-07-01 DIAGNOSIS — I6782 Cerebral ischemia: Secondary | ICD-10-CM | POA: Diagnosis not present

## 2022-07-01 MED ORDER — SODIUM CHLORIDE (PF) 0.9 % IJ SOLN
INTRAMUSCULAR | Status: AC
  Filled 2022-07-01: qty 50

## 2022-07-01 MED ORDER — IOHEXOL 300 MG/ML  SOLN
75.0000 mL | Freq: Once | INTRAMUSCULAR | Status: AC | PRN
  Administered 2022-07-01: 75 mL via INTRAVENOUS

## 2022-07-02 ENCOUNTER — Encounter: Payer: Self-pay | Admitting: Hematology

## 2022-07-02 ENCOUNTER — Inpatient Hospital Stay: Payer: Medicare PPO

## 2022-07-02 ENCOUNTER — Other Ambulatory Visit: Payer: Self-pay

## 2022-07-02 ENCOUNTER — Inpatient Hospital Stay: Payer: Medicare PPO | Admitting: Dietician

## 2022-07-02 ENCOUNTER — Inpatient Hospital Stay: Payer: Medicare PPO | Attending: Hematology

## 2022-07-02 VITALS — BP 138/80 | HR 49 | Temp 97.9°F | Resp 18

## 2022-07-02 DIAGNOSIS — D649 Anemia, unspecified: Secondary | ICD-10-CM | POA: Insufficient documentation

## 2022-07-02 DIAGNOSIS — I129 Hypertensive chronic kidney disease with stage 1 through stage 4 chronic kidney disease, or unspecified chronic kidney disease: Secondary | ICD-10-CM | POA: Insufficient documentation

## 2022-07-02 DIAGNOSIS — N183 Chronic kidney disease, stage 3 unspecified: Secondary | ICD-10-CM | POA: Insufficient documentation

## 2022-07-02 DIAGNOSIS — Z7189 Other specified counseling: Secondary | ICD-10-CM

## 2022-07-02 DIAGNOSIS — Z7982 Long term (current) use of aspirin: Secondary | ICD-10-CM | POA: Diagnosis not present

## 2022-07-02 DIAGNOSIS — Z5112 Encounter for antineoplastic immunotherapy: Secondary | ICD-10-CM | POA: Insufficient documentation

## 2022-07-02 DIAGNOSIS — F32A Depression, unspecified: Secondary | ICD-10-CM | POA: Insufficient documentation

## 2022-07-02 DIAGNOSIS — C9000 Multiple myeloma not having achieved remission: Secondary | ICD-10-CM

## 2022-07-02 DIAGNOSIS — Z79899 Other long term (current) drug therapy: Secondary | ICD-10-CM | POA: Diagnosis not present

## 2022-07-02 DIAGNOSIS — E8581 Light chain (AL) amyloidosis: Secondary | ICD-10-CM | POA: Diagnosis not present

## 2022-07-02 DIAGNOSIS — Z79624 Long term (current) use of inhibitors of nucleotide synthesis: Secondary | ICD-10-CM | POA: Diagnosis not present

## 2022-07-02 LAB — CMP (CANCER CENTER ONLY)
ALT: 10 U/L (ref 0–44)
AST: 22 U/L (ref 15–41)
Albumin: 3.7 g/dL (ref 3.5–5.0)
Alkaline Phosphatase: 95 U/L (ref 38–126)
Anion gap: 9 (ref 5–15)
BUN: 7 mg/dL — ABNORMAL LOW (ref 8–23)
CO2: 18 mmol/L — ABNORMAL LOW (ref 22–32)
Calcium: 7.2 mg/dL — ABNORMAL LOW (ref 8.9–10.3)
Chloride: 115 mmol/L — ABNORMAL HIGH (ref 98–111)
Creatinine: 1.45 mg/dL — ABNORMAL HIGH (ref 0.61–1.24)
GFR, Estimated: 48 mL/min — ABNORMAL LOW (ref >60)
Glucose, Bld: 89 mg/dL (ref 70–99)
Potassium: 2.8 mmol/L — ABNORMAL LOW (ref 3.5–5.1)
Sodium: 142 mmol/L (ref 135–145)
Total Bilirubin: 1 mg/dL (ref 0.3–1.2)
Total Protein: 5.9 g/dL — ABNORMAL LOW (ref 6.5–8.1)

## 2022-07-02 LAB — CBC WITH DIFFERENTIAL (CANCER CENTER ONLY)
Abs Immature Granulocytes: 0.01 10*3/uL (ref 0.00–0.07)
Basophils Absolute: 0 10*3/uL (ref 0.0–0.1)
Basophils Relative: 0 %
Eosinophils Absolute: 0.1 10*3/uL (ref 0.0–0.5)
Eosinophils Relative: 4 %
HCT: 26.8 % — ABNORMAL LOW (ref 39.0–52.0)
Hemoglobin: 9.2 g/dL — ABNORMAL LOW (ref 13.0–17.0)
Immature Granulocytes: 0 %
Lymphocytes Relative: 36 %
Lymphs Abs: 1.1 10*3/uL (ref 0.7–4.0)
MCH: 31.3 pg (ref 26.0–34.0)
MCHC: 34.3 g/dL (ref 30.0–36.0)
MCV: 91.2 fL (ref 80.0–100.0)
Monocytes Absolute: 0.7 10*3/uL (ref 0.1–1.0)
Monocytes Relative: 23 %
Neutro Abs: 1.1 10*3/uL — ABNORMAL LOW (ref 1.7–7.7)
Neutrophils Relative %: 37 %
Platelet Count: 237 10*3/uL (ref 150–400)
RBC: 2.94 MIL/uL — ABNORMAL LOW (ref 4.22–5.81)
RDW: 18.9 % — ABNORMAL HIGH (ref 11.5–15.5)
WBC Count: 3 10*3/uL — ABNORMAL LOW (ref 4.0–10.5)
nRBC: 0 % (ref 0.0–0.2)

## 2022-07-02 MED ORDER — POTASSIUM CHLORIDE CRYS ER 20 MEQ PO TBCR
40.0000 meq | EXTENDED_RELEASE_TABLET | Freq: Once | ORAL | Status: AC
  Administered 2022-07-02: 40 meq via ORAL
  Filled 2022-07-02: qty 2

## 2022-07-02 MED ORDER — DEXAMETHASONE 4 MG PO TABS
8.0000 mg | ORAL_TABLET | Freq: Once | ORAL | Status: AC
  Administered 2022-07-02: 8 mg via ORAL
  Filled 2022-07-02: qty 2

## 2022-07-02 MED ORDER — DIPHENHYDRAMINE HCL 25 MG PO CAPS
25.0000 mg | ORAL_CAPSULE | Freq: Once | ORAL | Status: AC
  Administered 2022-07-02: 25 mg via ORAL
  Filled 2022-07-02: qty 1

## 2022-07-02 MED ORDER — DARATUMUMAB-HYALURONIDASE-FIHJ 1800-30000 MG-UT/15ML ~~LOC~~ SOLN
1800.0000 mg | Freq: Once | SUBCUTANEOUS | Status: AC
  Administered 2022-07-02: 1800 mg via SUBCUTANEOUS
  Filled 2022-07-02: qty 15

## 2022-07-02 MED ORDER — POTASSIUM CHLORIDE CRYS ER 20 MEQ PO TBCR
40.0000 meq | EXTENDED_RELEASE_TABLET | ORAL | Status: AC
  Administered 2022-07-02: 40 meq via ORAL
  Filled 2022-07-02: qty 2

## 2022-07-02 MED ORDER — ACETAMINOPHEN 325 MG PO TABS
650.0000 mg | ORAL_TABLET | Freq: Once | ORAL | Status: AC
  Administered 2022-07-02: 650 mg via ORAL
  Filled 2022-07-02: qty 2

## 2022-07-02 MED ORDER — FAMOTIDINE 20 MG PO TABS
40.0000 mg | ORAL_TABLET | Freq: Once | ORAL | Status: AC
  Administered 2022-07-02: 40 mg via ORAL
  Filled 2022-07-02: qty 2

## 2022-07-02 NOTE — Progress Notes (Signed)
Nutrition Assessment   Reason for Assessment: Hospital follow-up   ASSESSMENT: 82 year old male with multiple myeloma. He is currently receiving DaraCyBorD q28d. He is under the care of Dr. Candise Che  Past medical history includes HTN, hypotension, pathologic fracture of right femur, CKD3  3/29-4/2 admission with hypokalemia   Met with patient and wife during infusion. Patient reports appetite is poor secondary to altered taste. Smells are also bothersome. Foods are not tasting good. He likes the taste of citrus per wife, says he eats oranges pretty good. Patient reports liking beanie weenies recently. Had a can for supper last night. Patient enjoys cream of chicken soup. This taste like it normally does. Patient does not like the taste of Ensure of Boost. He denies nausea, vomiting, constipation. He is having loose stools a couple times daily.   Nutrition Focused Physical Exam: deferred    Medications: ferrous sultate, zofran, imodium, klor-con, compazine, drisdol   Labs: K 2.8, BUN 7, Cr 1.45   Anthropometrics:   Height: 5'9.5" Weight: 208 lb 11.2 oz (4/19) UBW: 226 lb (2/3) BMI: 30.38   NUTRITION DIAGNOSIS: Inadequate oral intake related to side effects of therapy as evidenced by reported altered taste, decreased appetite, 8% (18 lb) under reported usual weight in 3 months - significant for time frame   INTERVENTION: Educated on strategies for altered taste and smell. Suggested trying baking soda salt water rinses several times daily and before meals - handout with tips + recipe provided Discussed importance of smaller more frequent meals/snacks vs attempting 3 larger meals - handout with snack ideas provided Discussed strategies for adding calories and protein to foods, encouraged lean proteins with all meals - soft moist high protein foods provided Encouraged oral nutrition supplement as tolerated for added calories and protein - samples of Boost Breeze + Boost VHC as well as  Pedialyte hydration packets given for patient to try Contact information given    MONITORING, EVALUATION, GOAL: Patient will tolerate increased calories and protein to minimize further weight loss    Next Visit: Friday May 31 during infusion

## 2022-07-02 NOTE — Progress Notes (Signed)
Pt is ok for tx today per Dr Candise Che with ANC of 1.1

## 2022-07-02 NOTE — Progress Notes (Signed)
 KCL Po here and rpt dose in 2 h prior to him leaving and continue PO KCL at home  VO per Dr. Candise Che for K+ of 2.8. Patient and patient spouse aware

## 2022-07-02 NOTE — Patient Instructions (Signed)
[facility]  Discharge Instructions: Thank you for choosing [facility] to provide your oncology and hematology care.   If you have a lab appointment with the Cancer Center, please go directly to the Cancer Center and check in at the registration area.   Wear comfortable clothing and clothing appropriate for easy access to any Portacath or PICC line.   We strive to give you quality time with your provider. You may need to reschedule your appointment if you arrive late (15 or more minutes).  Arriving late affects you and other patients whose appointments are after yours.  Also, if you miss three or more appointments without notifying the office, you may be dismissed from the clinic at the provider's discretion.      For prescription refill requests, have your pharmacy contact our office and allow 72 hours for refills to be completed.    Today you received the following chemotherapy and/or immunotherapy agents Darzalex Faspro      To help prevent nausea and vomiting after your treatment, we encourage you to take your nausea medication as directed.  BELOW ARE SYMPTOMS THAT SHOULD BE REPORTED IMMEDIATELY: *FEVER GREATER THAN 100.4 F (38 C) OR HIGHER *CHILLS OR SWEATING *NAUSEA AND VOMITING THAT IS NOT CONTROLLED WITH YOUR NAUSEA MEDICATION *UNUSUAL SHORTNESS OF BREATH *UNUSUAL BRUISING OR BLEEDING *URINARY PROBLEMS (pain or burning when urinating, or frequent urination) *BOWEL PROBLEMS (unusual diarrhea, constipation, pain near the anus) TENDERNESS IN MOUTH AND THROAT WITH OR WITHOUT PRESENCE OF ULCERS (sore throat, sores in mouth, or a toothache) UNUSUAL RASH, SWELLING OR PAIN  UNUSUAL VAGINAL DISCHARGE OR ITCHING   Items with * indicate a potential emergency and should be followed up as soon as possible or go to the Emergency Department if any problems should occur.  Please show the CHEMOTHERAPY ALERT CARD or IMMUNOTHERAPY ALERT CARD at  check-in to the Emergency Department and triage nurse.  Should you have questions after your visit or need to cancel or reschedule your appointment, please contact  [facility]  Dept: [phone]  and follow the prompts.  Office hours are 8:00 a.m. to 4:30 p.m. Monday - Friday. Please note that voicemails left after 4:00 p.m. may not be returned until the following business day.  We are closed weekends and major holidays. You have access to a nurse at all times for urgent questions. Please call the main number to the clinic Dept: [phone] and follow the prompts.   For any non-urgent questions, you may also contact your provider using MyChart. We now offer e-Visits for anyone 18 and older to request care online for non-urgent symptoms. For details visit [email or web address].   Also download the MyChart app! Go to the app store, search "MyChart", open the app, select , and log in with your MyChart username and password.  

## 2022-07-05 DIAGNOSIS — C7951 Secondary malignant neoplasm of bone: Secondary | ICD-10-CM | POA: Diagnosis not present

## 2022-07-05 DIAGNOSIS — M84551D Pathological fracture in neoplastic disease, right femur, subsequent encounter for fracture with routine healing: Secondary | ICD-10-CM | POA: Diagnosis not present

## 2022-07-05 DIAGNOSIS — L89613 Pressure ulcer of right heel, stage 3: Secondary | ICD-10-CM | POA: Diagnosis not present

## 2022-07-05 DIAGNOSIS — N1831 Chronic kidney disease, stage 3a: Secondary | ICD-10-CM | POA: Diagnosis not present

## 2022-07-05 DIAGNOSIS — C9000 Multiple myeloma not having achieved remission: Secondary | ICD-10-CM | POA: Diagnosis not present

## 2022-07-05 DIAGNOSIS — I129 Hypertensive chronic kidney disease with stage 1 through stage 4 chronic kidney disease, or unspecified chronic kidney disease: Secondary | ICD-10-CM | POA: Diagnosis not present

## 2022-07-05 DIAGNOSIS — I959 Hypotension, unspecified: Secondary | ICD-10-CM | POA: Diagnosis not present

## 2022-07-05 DIAGNOSIS — C679 Malignant neoplasm of bladder, unspecified: Secondary | ICD-10-CM | POA: Diagnosis not present

## 2022-07-05 DIAGNOSIS — L89322 Pressure ulcer of left buttock, stage 2: Secondary | ICD-10-CM | POA: Diagnosis not present

## 2022-07-06 DIAGNOSIS — L89322 Pressure ulcer of left buttock, stage 2: Secondary | ICD-10-CM | POA: Diagnosis not present

## 2022-07-06 DIAGNOSIS — C9000 Multiple myeloma not having achieved remission: Secondary | ICD-10-CM | POA: Diagnosis not present

## 2022-07-06 DIAGNOSIS — N1831 Chronic kidney disease, stage 3a: Secondary | ICD-10-CM | POA: Diagnosis not present

## 2022-07-06 DIAGNOSIS — C679 Malignant neoplasm of bladder, unspecified: Secondary | ICD-10-CM | POA: Diagnosis not present

## 2022-07-06 DIAGNOSIS — L89613 Pressure ulcer of right heel, stage 3: Secondary | ICD-10-CM | POA: Diagnosis not present

## 2022-07-06 DIAGNOSIS — I129 Hypertensive chronic kidney disease with stage 1 through stage 4 chronic kidney disease, or unspecified chronic kidney disease: Secondary | ICD-10-CM | POA: Diagnosis not present

## 2022-07-06 DIAGNOSIS — M84551D Pathological fracture in neoplastic disease, right femur, subsequent encounter for fracture with routine healing: Secondary | ICD-10-CM | POA: Diagnosis not present

## 2022-07-06 DIAGNOSIS — C7951 Secondary malignant neoplasm of bone: Secondary | ICD-10-CM | POA: Diagnosis not present

## 2022-07-06 DIAGNOSIS — I959 Hypotension, unspecified: Secondary | ICD-10-CM | POA: Diagnosis not present

## 2022-07-07 DIAGNOSIS — I959 Hypotension, unspecified: Secondary | ICD-10-CM | POA: Diagnosis not present

## 2022-07-07 DIAGNOSIS — L89322 Pressure ulcer of left buttock, stage 2: Secondary | ICD-10-CM | POA: Diagnosis not present

## 2022-07-07 DIAGNOSIS — C7951 Secondary malignant neoplasm of bone: Secondary | ICD-10-CM | POA: Diagnosis not present

## 2022-07-07 DIAGNOSIS — I129 Hypertensive chronic kidney disease with stage 1 through stage 4 chronic kidney disease, or unspecified chronic kidney disease: Secondary | ICD-10-CM | POA: Diagnosis not present

## 2022-07-07 DIAGNOSIS — L89613 Pressure ulcer of right heel, stage 3: Secondary | ICD-10-CM | POA: Diagnosis not present

## 2022-07-07 DIAGNOSIS — N1831 Chronic kidney disease, stage 3a: Secondary | ICD-10-CM | POA: Diagnosis not present

## 2022-07-07 DIAGNOSIS — C9000 Multiple myeloma not having achieved remission: Secondary | ICD-10-CM | POA: Diagnosis not present

## 2022-07-07 DIAGNOSIS — C679 Malignant neoplasm of bladder, unspecified: Secondary | ICD-10-CM | POA: Diagnosis not present

## 2022-07-07 DIAGNOSIS — M84551D Pathological fracture in neoplastic disease, right femur, subsequent encounter for fracture with routine healing: Secondary | ICD-10-CM | POA: Diagnosis not present

## 2022-07-08 ENCOUNTER — Telehealth: Payer: Self-pay

## 2022-07-08 DIAGNOSIS — N1831 Chronic kidney disease, stage 3a: Secondary | ICD-10-CM | POA: Diagnosis not present

## 2022-07-08 DIAGNOSIS — L89613 Pressure ulcer of right heel, stage 3: Secondary | ICD-10-CM | POA: Diagnosis not present

## 2022-07-08 DIAGNOSIS — C7951 Secondary malignant neoplasm of bone: Secondary | ICD-10-CM | POA: Diagnosis not present

## 2022-07-08 DIAGNOSIS — I129 Hypertensive chronic kidney disease with stage 1 through stage 4 chronic kidney disease, or unspecified chronic kidney disease: Secondary | ICD-10-CM | POA: Diagnosis not present

## 2022-07-08 DIAGNOSIS — M84551D Pathological fracture in neoplastic disease, right femur, subsequent encounter for fracture with routine healing: Secondary | ICD-10-CM | POA: Diagnosis not present

## 2022-07-08 DIAGNOSIS — L89322 Pressure ulcer of left buttock, stage 2: Secondary | ICD-10-CM | POA: Diagnosis not present

## 2022-07-08 DIAGNOSIS — C679 Malignant neoplasm of bladder, unspecified: Secondary | ICD-10-CM | POA: Diagnosis not present

## 2022-07-08 DIAGNOSIS — C9000 Multiple myeloma not having achieved remission: Secondary | ICD-10-CM | POA: Diagnosis not present

## 2022-07-08 DIAGNOSIS — I959 Hypotension, unspecified: Secondary | ICD-10-CM | POA: Diagnosis not present

## 2022-07-08 NOTE — Telephone Encounter (Signed)
Attempted to contact pt per Georga Kaufmann [facility], left pt a message: CT head was negative for any acute abnormalities.

## 2022-07-09 ENCOUNTER — Telehealth: Payer: Self-pay

## 2022-07-09 ENCOUNTER — Inpatient Hospital Stay: Payer: Medicare PPO

## 2022-07-09 ENCOUNTER — Telehealth: Payer: Self-pay | Admitting: Cardiovascular Disease

## 2022-07-09 ENCOUNTER — Inpatient Hospital Stay (HOSPITAL_BASED_OUTPATIENT_CLINIC_OR_DEPARTMENT_OTHER): Payer: Medicare PPO | Admitting: Hematology

## 2022-07-09 ENCOUNTER — Other Ambulatory Visit: Payer: Self-pay

## 2022-07-09 VITALS — BP 126/76 | HR 76 | Temp 97.3°F | Resp 13 | Wt 202.5 lb

## 2022-07-09 VITALS — BP 139/69 | HR 72 | Temp 97.8°F | Resp 16

## 2022-07-09 DIAGNOSIS — E876 Hypokalemia: Secondary | ICD-10-CM

## 2022-07-09 DIAGNOSIS — Z5112 Encounter for antineoplastic immunotherapy: Secondary | ICD-10-CM | POA: Diagnosis not present

## 2022-07-09 DIAGNOSIS — Z7189 Other specified counseling: Secondary | ICD-10-CM

## 2022-07-09 DIAGNOSIS — C9000 Multiple myeloma not having achieved remission: Secondary | ICD-10-CM

## 2022-07-09 DIAGNOSIS — I129 Hypertensive chronic kidney disease with stage 1 through stage 4 chronic kidney disease, or unspecified chronic kidney disease: Secondary | ICD-10-CM | POA: Diagnosis not present

## 2022-07-09 DIAGNOSIS — E8581 Light chain (AL) amyloidosis: Secondary | ICD-10-CM | POA: Diagnosis not present

## 2022-07-09 DIAGNOSIS — Z7982 Long term (current) use of aspirin: Secondary | ICD-10-CM | POA: Diagnosis not present

## 2022-07-09 DIAGNOSIS — Z79624 Long term (current) use of inhibitors of nucleotide synthesis: Secondary | ICD-10-CM | POA: Diagnosis not present

## 2022-07-09 DIAGNOSIS — I498 Other specified cardiac arrhythmias: Secondary | ICD-10-CM | POA: Diagnosis not present

## 2022-07-09 DIAGNOSIS — N183 Chronic kidney disease, stage 3 unspecified: Secondary | ICD-10-CM | POA: Diagnosis not present

## 2022-07-09 DIAGNOSIS — D649 Anemia, unspecified: Secondary | ICD-10-CM | POA: Diagnosis not present

## 2022-07-09 DIAGNOSIS — R001 Bradycardia, unspecified: Secondary | ICD-10-CM | POA: Diagnosis not present

## 2022-07-09 DIAGNOSIS — F32A Depression, unspecified: Secondary | ICD-10-CM | POA: Diagnosis not present

## 2022-07-09 LAB — CBC WITH DIFFERENTIAL (CANCER CENTER ONLY)
Abs Immature Granulocytes: 0 10*3/uL (ref 0.00–0.07)
Basophils Absolute: 0 10*3/uL (ref 0.0–0.1)
Basophils Relative: 0 %
Eosinophils Absolute: 0.1 10*3/uL (ref 0.0–0.5)
Eosinophils Relative: 3 %
HCT: 26.7 % — ABNORMAL LOW (ref 39.0–52.0)
Hemoglobin: 9.5 g/dL — ABNORMAL LOW (ref 13.0–17.0)
Immature Granulocytes: 0 %
Lymphocytes Relative: 34 %
Lymphs Abs: 1 10*3/uL (ref 0.7–4.0)
MCH: 32.4 pg (ref 26.0–34.0)
MCHC: 35.6 g/dL (ref 30.0–36.0)
MCV: 91.1 fL (ref 80.0–100.0)
Monocytes Absolute: 0.6 10*3/uL (ref 0.1–1.0)
Monocytes Relative: 19 %
Neutro Abs: 1.4 10*3/uL — ABNORMAL LOW (ref 1.7–7.7)
Neutrophils Relative %: 44 %
Platelet Count: 252 10*3/uL (ref 150–400)
RBC: 2.93 MIL/uL — ABNORMAL LOW (ref 4.22–5.81)
RDW: 18.4 % — ABNORMAL HIGH (ref 11.5–15.5)
WBC Count: 3.1 10*3/uL — ABNORMAL LOW (ref 4.0–10.5)
nRBC: 0 % (ref 0.0–0.2)

## 2022-07-09 LAB — CMP (CANCER CENTER ONLY)
ALT: 9 U/L (ref 0–44)
AST: 23 U/L (ref 15–41)
Albumin: 3.7 g/dL (ref 3.5–5.0)
Alkaline Phosphatase: 100 U/L (ref 38–126)
Anion gap: 9 (ref 5–15)
BUN: 7 mg/dL — ABNORMAL LOW (ref 8–23)
CO2: 17 mmol/L — ABNORMAL LOW (ref 22–32)
Calcium: 7.1 mg/dL — ABNORMAL LOW (ref 8.9–10.3)
Chloride: 117 mmol/L — ABNORMAL HIGH (ref 98–111)
Creatinine: 1.36 mg/dL — ABNORMAL HIGH (ref 0.61–1.24)
GFR, Estimated: 52 mL/min — ABNORMAL LOW (ref >60)
Glucose, Bld: 95 mg/dL (ref 70–99)
Potassium: 2.8 mmol/L — ABNORMAL LOW (ref 3.5–5.1)
Sodium: 143 mmol/L (ref 135–145)
Total Bilirubin: 0.9 mg/dL (ref 0.3–1.2)
Total Protein: 6.1 g/dL — ABNORMAL LOW (ref 6.5–8.1)

## 2022-07-09 MED ORDER — POTASSIUM CHLORIDE CRYS ER 20 MEQ PO TBCR: 40.0000 meq | EXTENDED_RELEASE_TABLET | Freq: Two times a day (BID) | ORAL | 0 refills | Status: DC

## 2022-07-09 MED ORDER — DIPHENHYDRAMINE HCL 25 MG PO CAPS
25.0000 mg | ORAL_CAPSULE | Freq: Once | ORAL | Status: AC
  Administered 2022-07-09: 25 mg via ORAL
  Filled 2022-07-09: qty 1

## 2022-07-09 MED ORDER — ACETAMINOPHEN 325 MG PO TABS
650.0000 mg | ORAL_TABLET | Freq: Once | ORAL | Status: AC
  Administered 2022-07-09: 650 mg via ORAL
  Filled 2022-07-09: qty 2

## 2022-07-09 MED ORDER — DARATUMUMAB-HYALURONIDASE-FIHJ 1800-30000 MG-UT/15ML ~~LOC~~ SOLN
1800.0000 mg | Freq: Once | SUBCUTANEOUS | Status: AC
  Administered 2022-07-09: 1800 mg via SUBCUTANEOUS
  Filled 2022-07-09: qty 15

## 2022-07-09 MED ORDER — VITAMIN D (ERGOCALCIFEROL) 1.25 MG (50000 UNIT) PO CAPS
50000.0000 [IU] | ORAL_CAPSULE | ORAL | 0 refills | Status: DC
Start: 2022-07-09 — End: 2023-01-25

## 2022-07-09 MED ORDER — DEXAMETHASONE 4 MG PO TABS
6.0000 mg | ORAL_TABLET | Freq: Once | ORAL | Status: AC
  Administered 2022-07-09: 6 mg via ORAL
  Filled 2022-07-09: qty 2

## 2022-07-09 MED ORDER — FAMOTIDINE 20 MG PO TABS
40.0000 mg | ORAL_TABLET | Freq: Once | ORAL | Status: AC
  Administered 2022-07-09: 40 mg via ORAL
  Filled 2022-07-09: qty 2

## 2022-07-09 MED ORDER — MIRTAZAPINE 15 MG PO TABS: 15.0000 mg | ORAL_TABLET | Freq: Every day | ORAL | 1 refills | Status: DC

## 2022-07-09 NOTE — Telephone Encounter (Signed)
   Cardiac Monitor Alert  Date of alert:  07/09/2022   Patient Name: Frank Hernandez  DOB: [birth year]  MRN: [id number]   [facility] Cardiologist: Dr Domingo Cocking [facility] EP:  None    Monitor Information: Long Term Monitor [ZioXT]  Reason:  Bradycardia Ordering provider:  Robin Searing, NP   Alert Ventricular Tachycardia Pause(s) - Longest:  3.9 seconds This is the 1st alert for this rhythm.   Next Cardiology Appointment   Date:  10/01/2022  Provider:  Robin Searing, NP  The patient was contacted today.  He is asymptomatic. Patient reports he feels good and has not experienced any dizziness, syncope or near syncope.  Arrhythmia, symptoms and history reviewed with Dr Clifton James (DOD).   Plan:  Forward to Dr Ladona Ridgel and Robin Searing, NP for review     Eilleen Kempf, RN  07/09/2022 3:45 PM

## 2022-07-09 NOTE — Telephone Encounter (Signed)
Return call to [facility] to report an abnormal ZIO patch results. This has already been addressed with patient and provider and ZIO results are posted in EPIC. Please refer to previous call note/encounter for details.

## 2022-07-09 NOTE — Patient Instructions (Addendum)
[facility]  Discharge Instructions: Thank you for choosing [facility] to provide your oncology and hematology care.   If you have a lab appointment with the Cancer Center, please go directly to the Cancer Center and check in at the registration area.   Wear comfortable clothing and clothing appropriate for easy access to any Portacath or PICC line.   We strive to give you quality time with your provider. You may need to reschedule your appointment if you arrive late (15 or more minutes).  Arriving late affects you and other patients whose appointments are after yours.  Also, if you miss three or more appointments without notifying the office, you may be dismissed from the clinic at the provider's discretion.      For prescription refill requests, have your pharmacy contact our office and allow 72 hours for refills to be completed.    Today you received the following chemotherapy and/or immunotherapy agent: Daratumumab (Darzalex Faspro)   To help prevent nausea and vomiting after your treatment, we encourage you to take your nausea medication as directed.  BELOW ARE SYMPTOMS THAT SHOULD BE REPORTED IMMEDIATELY: *FEVER GREATER THAN 100.4 F (38 C) OR HIGHER *CHILLS OR SWEATING *NAUSEA AND VOMITING THAT IS NOT CONTROLLED WITH YOUR NAUSEA MEDICATION *UNUSUAL SHORTNESS OF BREATH *UNUSUAL BRUISING OR BLEEDING *URINARY PROBLEMS (pain or burning when urinating, or frequent urination) *BOWEL PROBLEMS (unusual diarrhea, constipation, pain near the anus) TENDERNESS IN MOUTH AND THROAT WITH OR WITHOUT PRESENCE OF ULCERS (sore throat, sores in mouth, or a toothache) UNUSUAL RASH, SWELLING OR PAIN  UNUSUAL VAGINAL DISCHARGE OR ITCHING   Items with * indicate a potential emergency and should be followed up as soon as possible or go to the Emergency Department if any problems should occur.  Please show the CHEMOTHERAPY ALERT CARD or IMMUNOTHERAPY  ALERT CARD at check-in to the Emergency Department and triage nurse.  Should you have questions after your visit or need to cancel or reschedule your appointment, please contact [facility]  Dept: [phone]  and follow the prompts.  Office hours are 8:00 a.m. to 4:30 p.m. Monday - Friday. Please note that voicemails left after 4:00 p.m. may not be returned until the following business day.  We are closed weekends and major holidays. You have access to a nurse at all times for urgent questions. Please call the main number to the clinic Dept: [phone] and follow the prompts.   For any non-urgent questions, you may also contact your provider using MyChart. We now offer e-Visits for anyone 38 and older to request care online for non-urgent symptoms. For details visit [email or web address].   Also download the MyChart app! Go to the app store, search "MyChart", open the app, select [facility], and log in with your MyChart username and password.  Daratumumab Injection What is this medication? DARATUMUMAB (dar a toom ue mab) treats multiple myeloma, a type of bone marrow cancer. It works by helping your immune system slow or stop the spread of cancer cells. It is a monoclonal antibody. This medicine may be used for other purposes; ask your health care provider or pharmacist if you have questions. COMMON BRAND NAME(S): DARZALEX What should I tell my care team before I take this medication? They need to know if you have any of these conditions: Hereditary fructose intolerance Infection, such as chickenpox, herpes, hepatitis B Lung or breathing disease, such as asthma, COPD An unusual  or allergic reaction to daratumumab, sorbitol, other medications, foods, dyes, or preservatives Pregnant or trying to get pregnant Breastfeeding How should I use this medication? This medication is injected into a vein. It is given by your care team in a hospital or clinic  setting. Talk to your care team about the use of this medication in children. Special care may be needed. Overdosage: If you think you have taken too much of this medicine contact a poison control center or emergency room at once. NOTE: This medicine is only for you. Do not share this medicine with others. What if I miss a dose? Keep appointments for follow-up doses. It is important not to miss your dose. Call your care team if you are unable to keep an appointment. What may interact with this medication? Interactions have not been studied. This list may not describe all possible interactions. Give your health care provider a list of all the medicines, herbs, non-prescription drugs, or dietary supplements you use. Also tell them if you smoke, drink alcohol, or use illegal drugs. Some items may interact with your medicine. What should I watch for while using this medication? Your condition will be monitored carefully while you are receiving this medication. This medication can cause serious allergic reactions. To reduce your risk, your care team may give you other medication to take before receiving this one. Be sure to follow the directions from your care team. This medication can affect the results of blood tests to match your blood type. These changes can last for up to 6 months after the final dose. Your care team will do blood tests to match your blood type before you start treatment. Tell all of your care team that you are being treated with this medication before receiving a blood transfusion. This medication can affect the results of some tests used to determine treatment response; extra tests may be needed to evaluate response. Talk to your care team if you wish to become pregnant or think you are pregnant. This medication can cause serious birth defects if taken during pregnancy and for 3 months after the last dose. A reliable form of contraception is recommended while taking this medication  and for 3 months after the last dose. Talk to your care team about effective forms of contraception. Do not breast-feed while taking this medication. What side effects may I notice from receiving this medication? Side effects that you should report to your care team as soon as possible: Allergic reactions--skin rash, itching, hives, swelling of the face, lips, tongue, or throat Infection--fever, chills, cough, sore throat, wounds that don't heal, pain or trouble when passing urine, general feeling of discomfort or being unwell Infusion reactions--chest pain, shortness of breath or trouble breathing, feeling faint or lightheaded Unusual bruising or bleeding Side effects that usually do not require medical attention (report to your care team if they continue or are bothersome): Constipation Diarrhea Fatigue Nausea Pain, tingling, or numbness in the hands or feet Swelling of the ankles, hands, or feet This list may not describe all possible side effects. Call your doctor for medical advice about side effects. You may report side effects to FDA at 1-800-FDA-1088. Where should I keep my medication? This medication is given in a hospital or clinic. It will not be stored at home. NOTE: This sheet is a summary. It may not cover all possible information. If you have questions about this medicine, talk to your doctor, pharmacist, or health care provider.  2023 Elsevier/Gold Standard (2021-06-10 00:00:00)  Hypokalemia Hypokalemia means that the amount of potassium in the blood is lower than normal. Potassium is a mineral (electrolyte) that helps regulate the amount of fluid in the body. It also stimulates muscle tightening (contraction) and helps nerves work properly. Normally, most of the body's potassium is inside cells, and only a very small amount is in the blood. Because the amount in the blood is so small, minor changes to potassium levels in the blood can be life-threatening. What are the  causes? This condition may be caused by: Antibiotic medicine. Diarrhea or vomiting. Taking too much of a medicine that helps you have a bowel movement (laxative) can cause diarrhea and lead to hypokalemia. Chronic kidney disease (CKD). Medicines that help the body get rid of excess fluid (diuretics). Eating disorders, such as anorexia or bulimia. Low magnesium levels in the body. Sweating a lot. What are the signs or symptoms? Symptoms of this condition include: Weakness. Constipation. Fatigue. Muscle cramps. Mental confusion. Skipped heartbeats or irregular heartbeat (palpitations). Tingling or numbness. How is this diagnosed? This condition is diagnosed with a blood test. How is this treated? This condition may be treated by: Taking potassium supplements. Adjusting the medicines that you take. Eating more foods that contain a lot of potassium. If your potassium level is very low, you may need to get potassium through an IV and be monitored in the hospital. Follow these instructions at home: Eating and drinking  Eat a healthy diet. A healthy diet includes fresh fruits and vegetables, whole grains, healthy fats, and lean proteins. If told, eat more foods that contain a lot of potassium. These include: Nuts, such as peanuts and pistachios. Seeds, such as sunflower seeds and pumpkin seeds. Peas, lentils, and lima beans. Whole grain and bran cereals and breads. Fresh fruits and vegetables, such as apricots, avocado, bananas, cantaloupe, kiwi, oranges, tomatoes, asparagus, and potatoes. Juices, such as orange, tomato, and prune. Lean meats, including fish. Milk and milk products, such as yogurt. General instructions Take over-the-counter and prescription medicines only as told by your health care provider. This includes vitamins, natural food products, and supplements. Keep all follow-up visits. This is important. Contact a health care provider if: You have weakness that gets  worse. You feel your heart pounding or racing. You vomit. You have diarrhea. You have diabetes and you have trouble keeping your blood sugar in your target range. Get help right away if: You have chest pain. You have shortness of breath. You have vomiting or diarrhea that lasts for more than 2 days. You faint. These symptoms may be an emergency. Get help right away. Call 911. Do not wait to see if the symptoms will go away. Do not drive yourself to the hospital. Summary Hypokalemia means that the amount of potassium in the blood is lower than normal. This condition is diagnosed with a blood test. Hypokalemia may be treated by taking potassium supplements, adjusting the medicines that you take, or eating more foods that are high in potassium. If your potassium level is very low, you may need to get potassium through an IV and be monitored in the hospital. This information is not intended to replace advice given to you by your health care provider. Make sure you discuss any questions you have with your health care provider. Document Revised: 10/30/2020 Document Reviewed: 10/30/2020 Elsevier Patient Education  2023 [employer].

## 2022-07-09 NOTE — Progress Notes (Signed)
Patient seen by Dr. Addison Naegeli are within treatment parameters.  Labs reviewed: and are not all within treatment parameters. ANC 1.4, ok to proceed with tx per Dr Candise Che  Per physician team, patient is ready for treatment and there are NO modifications to the treatment plan.

## 2022-07-09 NOTE — Telephone Encounter (Signed)
Caller is reporting abnormal Zio patch results. 
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
## 2022-07-12 DIAGNOSIS — I129 Hypertensive chronic kidney disease with stage 1 through stage 4 chronic kidney disease, or unspecified chronic kidney disease: Secondary | ICD-10-CM | POA: Diagnosis not present

## 2022-07-12 DIAGNOSIS — C7951 Secondary malignant neoplasm of bone: Secondary | ICD-10-CM | POA: Diagnosis not present

## 2022-07-12 DIAGNOSIS — M84551D Pathological fracture in neoplastic disease, right femur, subsequent encounter for fracture with routine healing: Secondary | ICD-10-CM | POA: Diagnosis not present

## 2022-07-12 DIAGNOSIS — C679 Malignant neoplasm of bladder, unspecified: Secondary | ICD-10-CM | POA: Diagnosis not present

## 2022-07-12 DIAGNOSIS — L89613 Pressure ulcer of right heel, stage 3: Secondary | ICD-10-CM | POA: Diagnosis not present

## 2022-07-12 DIAGNOSIS — C9000 Multiple myeloma not having achieved remission: Secondary | ICD-10-CM | POA: Diagnosis not present

## 2022-07-12 DIAGNOSIS — I959 Hypotension, unspecified: Secondary | ICD-10-CM | POA: Diagnosis not present

## 2022-07-12 DIAGNOSIS — N1831 Chronic kidney disease, stage 3a: Secondary | ICD-10-CM | POA: Diagnosis not present

## 2022-07-12 DIAGNOSIS — L89322 Pressure ulcer of left buttock, stage 2: Secondary | ICD-10-CM | POA: Diagnosis not present

## 2022-07-14 DIAGNOSIS — L89322 Pressure ulcer of left buttock, stage 2: Secondary | ICD-10-CM | POA: Diagnosis not present

## 2022-07-14 DIAGNOSIS — N1831 Chronic kidney disease, stage 3a: Secondary | ICD-10-CM | POA: Diagnosis not present

## 2022-07-14 DIAGNOSIS — C679 Malignant neoplasm of bladder, unspecified: Secondary | ICD-10-CM | POA: Diagnosis not present

## 2022-07-14 DIAGNOSIS — I959 Hypotension, unspecified: Secondary | ICD-10-CM | POA: Diagnosis not present

## 2022-07-14 DIAGNOSIS — C9000 Multiple myeloma not having achieved remission: Secondary | ICD-10-CM | POA: Diagnosis not present

## 2022-07-14 DIAGNOSIS — I129 Hypertensive chronic kidney disease with stage 1 through stage 4 chronic kidney disease, or unspecified chronic kidney disease: Secondary | ICD-10-CM | POA: Diagnosis not present

## 2022-07-14 DIAGNOSIS — C7951 Secondary malignant neoplasm of bone: Secondary | ICD-10-CM | POA: Diagnosis not present

## 2022-07-14 DIAGNOSIS — L89613 Pressure ulcer of right heel, stage 3: Secondary | ICD-10-CM | POA: Diagnosis not present

## 2022-07-14 DIAGNOSIS — M84551D Pathological fracture in neoplastic disease, right femur, subsequent encounter for fracture with routine healing: Secondary | ICD-10-CM | POA: Diagnosis not present

## 2022-07-15 ENCOUNTER — Telehealth: Payer: Self-pay | Admitting: Orthopedic Surgery

## 2022-07-15 ENCOUNTER — Ambulatory Visit: Payer: Medicare PPO | Admitting: Orthopedic Surgery

## 2022-07-15 DIAGNOSIS — L89613 Pressure ulcer of right heel, stage 3: Secondary | ICD-10-CM | POA: Diagnosis not present

## 2022-07-15 DIAGNOSIS — C679 Malignant neoplasm of bladder, unspecified: Secondary | ICD-10-CM | POA: Diagnosis not present

## 2022-07-15 DIAGNOSIS — L89322 Pressure ulcer of left buttock, stage 2: Secondary | ICD-10-CM | POA: Diagnosis not present

## 2022-07-15 DIAGNOSIS — I959 Hypotension, unspecified: Secondary | ICD-10-CM | POA: Diagnosis not present

## 2022-07-15 DIAGNOSIS — C7951 Secondary malignant neoplasm of bone: Secondary | ICD-10-CM | POA: Diagnosis not present

## 2022-07-15 DIAGNOSIS — I129 Hypertensive chronic kidney disease with stage 1 through stage 4 chronic kidney disease, or unspecified chronic kidney disease: Secondary | ICD-10-CM | POA: Diagnosis not present

## 2022-07-15 DIAGNOSIS — C9000 Multiple myeloma not having achieved remission: Secondary | ICD-10-CM | POA: Diagnosis not present

## 2022-07-15 DIAGNOSIS — M84551D Pathological fracture in neoplastic disease, right femur, subsequent encounter for fracture with routine healing: Secondary | ICD-10-CM | POA: Diagnosis not present

## 2022-07-15 DIAGNOSIS — N1831 Chronic kidney disease, stage 3a: Secondary | ICD-10-CM | POA: Diagnosis not present

## 2022-07-15 NOTE — Telephone Encounter (Signed)
Orthopedic Telephone Call  Patient was supposed to have a appointment with me today.  He did not show up to the appointment so I called him.  He states that he is doing well.  He is ambulating with a walker but does not have any pain in his hip when he is doing so.  He is not taking any medications for pain.  Denies paresthesias and numbness.  Has not noticed any redness or drainage around his incisions. He is doing well and it is difficult for him and his wife to get to the office so I said we could space his follow up to 6 months from surgery (mid August). I will have my office call him to help him make that appointment. I will obtain new x-rays at that visit. I told him to call the office if he develops pain in the hip or has concerns about his surgery.   London Sheer, MD  Orthopedic Surgeon

## 2022-07-16 ENCOUNTER — Other Ambulatory Visit: Payer: Self-pay | Admitting: Hematology

## 2022-07-16 ENCOUNTER — Other Ambulatory Visit: Payer: Self-pay

## 2022-07-16 ENCOUNTER — Inpatient Hospital Stay: Payer: Medicare PPO

## 2022-07-16 ENCOUNTER — Telehealth: Payer: Self-pay | Admitting: *Deleted

## 2022-07-16 ENCOUNTER — Encounter: Payer: Self-pay | Admitting: Hematology

## 2022-07-16 VITALS — BP 170/82 | HR 69 | Temp 98.1°F | Resp 18 | Wt 199.5 lb

## 2022-07-16 DIAGNOSIS — C419 Malignant neoplasm of bone and articular cartilage, unspecified: Secondary | ICD-10-CM

## 2022-07-16 DIAGNOSIS — E8581 Light chain (AL) amyloidosis: Secondary | ICD-10-CM | POA: Diagnosis not present

## 2022-07-16 DIAGNOSIS — F32A Depression, unspecified: Secondary | ICD-10-CM | POA: Diagnosis not present

## 2022-07-16 DIAGNOSIS — Z79624 Long term (current) use of inhibitors of nucleotide synthesis: Secondary | ICD-10-CM | POA: Diagnosis not present

## 2022-07-16 DIAGNOSIS — I129 Hypertensive chronic kidney disease with stage 1 through stage 4 chronic kidney disease, or unspecified chronic kidney disease: Secondary | ICD-10-CM | POA: Diagnosis not present

## 2022-07-16 DIAGNOSIS — C9000 Multiple myeloma not having achieved remission: Secondary | ICD-10-CM | POA: Diagnosis not present

## 2022-07-16 DIAGNOSIS — D649 Anemia, unspecified: Secondary | ICD-10-CM | POA: Diagnosis not present

## 2022-07-16 DIAGNOSIS — E876 Hypokalemia: Secondary | ICD-10-CM

## 2022-07-16 DIAGNOSIS — Z7189 Other specified counseling: Secondary | ICD-10-CM

## 2022-07-16 DIAGNOSIS — N183 Chronic kidney disease, stage 3 unspecified: Secondary | ICD-10-CM | POA: Diagnosis not present

## 2022-07-16 DIAGNOSIS — Z5112 Encounter for antineoplastic immunotherapy: Secondary | ICD-10-CM | POA: Diagnosis not present

## 2022-07-16 DIAGNOSIS — Z7982 Long term (current) use of aspirin: Secondary | ICD-10-CM | POA: Diagnosis not present

## 2022-07-16 LAB — CMP (CANCER CENTER ONLY)
ALT: 10 U/L (ref 0–44)
AST: 23 U/L (ref 15–41)
Albumin: 3.7 g/dL (ref 3.5–5.0)
Alkaline Phosphatase: 105 U/L (ref 38–126)
Anion gap: 12 (ref 5–15)
BUN: 7 mg/dL — ABNORMAL LOW (ref 8–23)
CO2: 16 mmol/L — ABNORMAL LOW (ref 22–32)
Calcium: 6.7 mg/dL — ABNORMAL LOW (ref 8.9–10.3)
Chloride: 116 mmol/L — ABNORMAL HIGH (ref 98–111)
Creatinine: 1.49 mg/dL — ABNORMAL HIGH (ref 0.61–1.24)
GFR, Estimated: 47 mL/min — ABNORMAL LOW (ref >60)
Glucose, Bld: 95 mg/dL (ref 70–99)
Potassium: 2.6 mmol/L — CL (ref 3.5–5.1)
Sodium: 144 mmol/L (ref 135–145)
Total Bilirubin: 0.8 mg/dL (ref 0.3–1.2)
Total Protein: 6 g/dL — ABNORMAL LOW (ref 6.5–8.1)

## 2022-07-16 LAB — CBC WITH DIFFERENTIAL (CANCER CENTER ONLY)
Abs Immature Granulocytes: 0.01 10*3/uL (ref 0.00–0.07)
Basophils Absolute: 0 10*3/uL (ref 0.0–0.1)
Basophils Relative: 0 %
Eosinophils Absolute: 0.1 10*3/uL (ref 0.0–0.5)
Eosinophils Relative: 2 %
HCT: 27.7 % — ABNORMAL LOW (ref 39.0–52.0)
Hemoglobin: 9.9 g/dL — ABNORMAL LOW (ref 13.0–17.0)
Immature Granulocytes: 0 %
Lymphocytes Relative: 32 %
Lymphs Abs: 1.3 10*3/uL (ref 0.7–4.0)
MCH: 32.1 pg (ref 26.0–34.0)
MCHC: 35.7 g/dL (ref 30.0–36.0)
MCV: 89.9 fL (ref 80.0–100.0)
Monocytes Absolute: 0.8 10*3/uL (ref 0.1–1.0)
Monocytes Relative: 19 %
Neutro Abs: 1.9 10*3/uL (ref 1.7–7.7)
Neutrophils Relative %: 47 %
Platelet Count: 230 10*3/uL (ref 150–400)
RBC: 3.08 MIL/uL — ABNORMAL LOW (ref 4.22–5.81)
RDW: 18.4 % — ABNORMAL HIGH (ref 11.5–15.5)
WBC Count: 4.1 10*3/uL (ref 4.0–10.5)
nRBC: 0 % (ref 0.0–0.2)

## 2022-07-16 MED ORDER — ACETAMINOPHEN 325 MG PO TABS
650.0000 mg | ORAL_TABLET | Freq: Once | ORAL | Status: AC
  Administered 2022-07-16: 650 mg via ORAL
  Filled 2022-07-16: qty 2

## 2022-07-16 MED ORDER — POTASSIUM CHLORIDE 10 MEQ/100ML IV SOLN
10.0000 meq | INTRAVENOUS | Status: AC
  Administered 2022-07-16 (×2): 10 meq via INTRAVENOUS
  Filled 2022-07-16: qty 100

## 2022-07-16 MED ORDER — DENOSUMAB 120 MG/1.7ML ~~LOC~~ SOLN: 120.0000 mg | Freq: Once | SUBCUTANEOUS | Status: DC

## 2022-07-16 MED ORDER — FAMOTIDINE 20 MG PO TABS
40.0000 mg | ORAL_TABLET | Freq: Once | ORAL | Status: AC
  Administered 2022-07-16: 40 mg via ORAL
  Filled 2022-07-16: qty 2

## 2022-07-16 MED ORDER — SODIUM CHLORIDE 0.9 % IV SOLN: INTRAVENOUS | Status: DC

## 2022-07-16 MED ORDER — DEXAMETHASONE 4 MG PO TABS
6.0000 mg | ORAL_TABLET | Freq: Once | ORAL | Status: AC
  Administered 2022-07-16: 6 mg via ORAL
  Filled 2022-07-16: qty 2

## 2022-07-16 MED ORDER — DARATUMUMAB-HYALURONIDASE-FIHJ 1800-30000 MG-UT/15ML ~~LOC~~ SOLN
1800.0000 mg | Freq: Once | SUBCUTANEOUS | Status: DC
  Filled 2022-07-16: qty 15

## 2022-07-16 MED ORDER — DIPHENHYDRAMINE HCL 25 MG PO CAPS
25.0000 mg | ORAL_CAPSULE | Freq: Once | ORAL | Status: AC
  Administered 2022-07-16: 25 mg via ORAL
  Filled 2022-07-16: qty 1

## 2022-07-16 MED ORDER — POTASSIUM CHLORIDE CRYS ER 20 MEQ PO TBCR
40.0000 meq | EXTENDED_RELEASE_TABLET | Freq: Once | ORAL | Status: AC
  Administered 2022-07-16: 40 meq via ORAL
  Filled 2022-07-16: qty 2

## 2022-07-16 MED ORDER — POTASSIUM CHLORIDE 20 MEQ PO PACK: 40.0000 meq | PACK | Freq: Once | ORAL | Status: DC

## 2022-07-16 NOTE — Patient Instructions (Addendum)
  Increase Potassium to 3 times per Day  Hypokalemia Hypokalemia means that the amount of potassium in the blood is lower than normal. Potassium is a mineral (electrolyte) that helps regulate the amount of fluid in the body. It also stimulates muscle tightening (contraction) and helps nerves work properly. Normally, most of the body's potassium is inside cells, and only a very small amount is in the blood. Because the amount in the blood is so small, minor changes to potassium levels in the blood can be life-threatening. What are the causes? This condition may be caused by: Antibiotic medicine. Diarrhea or vomiting. Taking too much of a medicine that helps you have a bowel movement (laxative) can cause diarrhea and lead to hypokalemia. Chronic kidney disease (CKD). Medicines that help the body get rid of excess fluid (diuretics). Eating disorders, such as anorexia or bulimia. Low magnesium levels in the body. Sweating a lot. What are the signs or symptoms? Symptoms of this condition include: Weakness. Constipation. Fatigue. Muscle cramps. Mental confusion. Skipped heartbeats or irregular heartbeat (palpitations). Tingling or numbness. How is this diagnosed? This condition is diagnosed with a blood test. How is this treated? This condition may be treated by: Taking potassium supplements. Adjusting the medicines that you take. Eating more foods that contain a lot of potassium. If your potassium level is very low, you may need to get potassium through an IV and be monitored in the hospital. Follow these instructions at home: Eating and drinking  Eat a healthy diet. A healthy diet includes fresh fruits and vegetables, whole grains, healthy fats, and lean proteins. If told, eat more foods that contain a lot of potassium. These include: Nuts, such as peanuts and pistachios. Seeds, such as sunflower seeds and pumpkin seeds. Peas, lentils, and lima beans. Whole grain and bran cereals  and breads. Fresh fruits and vegetables, such as apricots, avocado, bananas, cantaloupe, kiwi, oranges, tomatoes, asparagus, and potatoes. Juices, such as orange, tomato, and prune. Lean meats, including fish. Milk and milk products, such as yogurt. General instructions Take over-the-counter and prescription medicines only as told by your health care provider. This includes vitamins, natural food products, and supplements. Keep all follow-up visits. This is important. Contact a health care provider if: You have weakness that gets worse. You feel your heart pounding or racing. You vomit. You have diarrhea. You have diabetes and you have trouble keeping your blood sugar in your target range. Get help right away if: You have chest pain. You have shortness of breath. You have vomiting or diarrhea that lasts for more than 2 days. You faint. These symptoms may be an emergency. Get help right away. Call 911. Do not wait to see if the symptoms will go away. Do not drive yourself to the hospital. Summary Hypokalemia means that the amount of potassium in the blood is lower than normal. This condition is diagnosed with a blood test. Hypokalemia may be treated by taking potassium supplements, adjusting the medicines that you take, or eating more foods that are high in potassium. If your potassium level is very low, you may need to get potassium through an IV and be monitored in the hospital. This information is not intended to replace advice given to you by your health care provider. Make sure you discuss any questions you have with your health care provider. Document Revised: 10/30/2020 Document Reviewed: 10/30/2020 Elsevier Patient Education  2023 [employer].

## 2022-07-16 NOTE — Telephone Encounter (Signed)
CRITICAL VALUE STICKER  CRITICAL VALUE:Potassium 2.6  RECEIVER (on-site recipient of call):Sandi K, RN  DATE & TIME NOTIFIED: 07/16/22; 1440  MESSENGER (representative from lab):Perry, J  MD NOTIFIED: Dr. Candise Che    TIME OF NOTIFICATION:1443  RESPONSE: Information acknowledged

## 2022-07-18 NOTE — Telephone Encounter (Signed)
No change in treatment. We do not treat asymptomatic rhythms.

## 2022-07-19 DIAGNOSIS — I959 Hypotension, unspecified: Secondary | ICD-10-CM | POA: Diagnosis not present

## 2022-07-19 DIAGNOSIS — C679 Malignant neoplasm of bladder, unspecified: Secondary | ICD-10-CM | POA: Diagnosis not present

## 2022-07-19 DIAGNOSIS — C7951 Secondary malignant neoplasm of bone: Secondary | ICD-10-CM | POA: Diagnosis not present

## 2022-07-19 DIAGNOSIS — M84551D Pathological fracture in neoplastic disease, right femur, subsequent encounter for fracture with routine healing: Secondary | ICD-10-CM | POA: Diagnosis not present

## 2022-07-19 DIAGNOSIS — C9000 Multiple myeloma not having achieved remission: Secondary | ICD-10-CM | POA: Diagnosis not present

## 2022-07-19 DIAGNOSIS — L89613 Pressure ulcer of right heel, stage 3: Secondary | ICD-10-CM | POA: Diagnosis not present

## 2022-07-19 DIAGNOSIS — I129 Hypertensive chronic kidney disease with stage 1 through stage 4 chronic kidney disease, or unspecified chronic kidney disease: Secondary | ICD-10-CM | POA: Diagnosis not present

## 2022-07-19 DIAGNOSIS — L89322 Pressure ulcer of left buttock, stage 2: Secondary | ICD-10-CM | POA: Diagnosis not present

## 2022-07-19 DIAGNOSIS — N1831 Chronic kidney disease, stage 3a: Secondary | ICD-10-CM | POA: Diagnosis not present

## 2022-07-19 LAB — KAPPA/LAMBDA LIGHT CHAINS
Kappa free light chain: 16.7 mg/L (ref 3.3–19.4)
Kappa, lambda light chain ratio: 1.27 (ref 0.26–1.65)
Lambda free light chains: 13.1 mg/L (ref 5.7–26.3)

## 2022-07-21 DIAGNOSIS — L89613 Pressure ulcer of right heel, stage 3: Secondary | ICD-10-CM | POA: Diagnosis not present

## 2022-07-21 DIAGNOSIS — I129 Hypertensive chronic kidney disease with stage 1 through stage 4 chronic kidney disease, or unspecified chronic kidney disease: Secondary | ICD-10-CM | POA: Diagnosis not present

## 2022-07-21 DIAGNOSIS — N1831 Chronic kidney disease, stage 3a: Secondary | ICD-10-CM | POA: Diagnosis not present

## 2022-07-21 DIAGNOSIS — M84551D Pathological fracture in neoplastic disease, right femur, subsequent encounter for fracture with routine healing: Secondary | ICD-10-CM | POA: Diagnosis not present

## 2022-07-21 DIAGNOSIS — I959 Hypotension, unspecified: Secondary | ICD-10-CM | POA: Diagnosis not present

## 2022-07-21 DIAGNOSIS — C7951 Secondary malignant neoplasm of bone: Secondary | ICD-10-CM | POA: Diagnosis not present

## 2022-07-21 DIAGNOSIS — C9000 Multiple myeloma not having achieved remission: Secondary | ICD-10-CM | POA: Diagnosis not present

## 2022-07-21 DIAGNOSIS — C679 Malignant neoplasm of bladder, unspecified: Secondary | ICD-10-CM | POA: Diagnosis not present

## 2022-07-21 DIAGNOSIS — L89322 Pressure ulcer of left buttock, stage 2: Secondary | ICD-10-CM | POA: Diagnosis not present

## 2022-07-22 DIAGNOSIS — C9000 Multiple myeloma not having achieved remission: Secondary | ICD-10-CM | POA: Diagnosis not present

## 2022-07-22 DIAGNOSIS — N1831 Chronic kidney disease, stage 3a: Secondary | ICD-10-CM | POA: Diagnosis not present

## 2022-07-22 DIAGNOSIS — M84551D Pathological fracture in neoplastic disease, right femur, subsequent encounter for fracture with routine healing: Secondary | ICD-10-CM | POA: Diagnosis not present

## 2022-07-22 DIAGNOSIS — I959 Hypotension, unspecified: Secondary | ICD-10-CM | POA: Diagnosis not present

## 2022-07-22 DIAGNOSIS — L89613 Pressure ulcer of right heel, stage 3: Secondary | ICD-10-CM | POA: Diagnosis not present

## 2022-07-22 DIAGNOSIS — I129 Hypertensive chronic kidney disease with stage 1 through stage 4 chronic kidney disease, or unspecified chronic kidney disease: Secondary | ICD-10-CM | POA: Diagnosis not present

## 2022-07-22 DIAGNOSIS — C679 Malignant neoplasm of bladder, unspecified: Secondary | ICD-10-CM | POA: Diagnosis not present

## 2022-07-22 DIAGNOSIS — C7951 Secondary malignant neoplasm of bone: Secondary | ICD-10-CM | POA: Diagnosis not present

## 2022-07-22 DIAGNOSIS — L89322 Pressure ulcer of left buttock, stage 2: Secondary | ICD-10-CM | POA: Diagnosis not present

## 2022-07-23 ENCOUNTER — Other Ambulatory Visit: Payer: Self-pay

## 2022-07-23 ENCOUNTER — Ambulatory Visit: Payer: Medicare PPO

## 2022-07-23 ENCOUNTER — Other Ambulatory Visit: Payer: Medicare PPO

## 2022-07-23 ENCOUNTER — Ambulatory Visit: Payer: Medicare PPO | Admitting: Physician Assistant

## 2022-07-27 LAB — MULTIPLE MYELOMA PANEL, SERUM
Albumin SerPl Elph-Mcnc: 3.4 g/dL (ref 2.9–4.4)
Albumin/Glob SerPl: 1.5 (ref 0.7–1.7)
Alpha 1: 0.3 g/dL (ref 0.0–0.4)
Alpha2 Glob SerPl Elph-Mcnc: 0.7 g/dL (ref 0.4–1.0)
B-Globulin SerPl Elph-Mcnc: 0.6 g/dL — ABNORMAL LOW (ref 0.7–1.3)
Gamma Glob SerPl Elph-Mcnc: 0.7 g/dL (ref 0.4–1.8)
Globulin, Total: 2.3 g/dL (ref 2.2–3.9)
IgA: 32 mg/dL — ABNORMAL LOW (ref 61–437)
IgG (Immunoglobin G), Serum: 925 mg/dL (ref 603–1613)
IgM (Immunoglobulin M), Srm: 19 mg/dL (ref 15–143)
M Protein SerPl Elph-Mcnc: 0.3 g/dL — ABNORMAL HIGH
Total Protein ELP: 5.7 g/dL — ABNORMAL LOW (ref 6.0–8.5)

## 2022-07-28 DIAGNOSIS — M84551D Pathological fracture in neoplastic disease, right femur, subsequent encounter for fracture with routine healing: Secondary | ICD-10-CM | POA: Diagnosis not present

## 2022-07-28 DIAGNOSIS — C9000 Multiple myeloma not having achieved remission: Secondary | ICD-10-CM | POA: Diagnosis not present

## 2022-07-28 DIAGNOSIS — C679 Malignant neoplasm of bladder, unspecified: Secondary | ICD-10-CM | POA: Diagnosis not present

## 2022-07-28 DIAGNOSIS — C7951 Secondary malignant neoplasm of bone: Secondary | ICD-10-CM | POA: Diagnosis not present

## 2022-07-28 DIAGNOSIS — I959 Hypotension, unspecified: Secondary | ICD-10-CM | POA: Diagnosis not present

## 2022-07-28 DIAGNOSIS — I129 Hypertensive chronic kidney disease with stage 1 through stage 4 chronic kidney disease, or unspecified chronic kidney disease: Secondary | ICD-10-CM | POA: Diagnosis not present

## 2022-07-28 DIAGNOSIS — N1831 Chronic kidney disease, stage 3a: Secondary | ICD-10-CM | POA: Diagnosis not present

## 2022-07-28 DIAGNOSIS — L89322 Pressure ulcer of left buttock, stage 2: Secondary | ICD-10-CM | POA: Diagnosis not present

## 2022-07-28 DIAGNOSIS — L89613 Pressure ulcer of right heel, stage 3: Secondary | ICD-10-CM | POA: Diagnosis not present

## 2022-07-29 ENCOUNTER — Other Ambulatory Visit: Payer: Self-pay

## 2022-07-29 ENCOUNTER — Emergency Department (HOSPITAL_COMMUNITY): Payer: Medicare PPO

## 2022-07-29 ENCOUNTER — Inpatient Hospital Stay (HOSPITAL_COMMUNITY)
Admission: EM | Admit: 2022-07-29 | Discharge: 2022-08-16 | DRG: 391 | Disposition: A | Discharge status: Skilled Nursing Facility | Payer: Medicare PPO | Attending: Internal Medicine | Admitting: Internal Medicine

## 2022-07-29 ENCOUNTER — Encounter (HOSPITAL_COMMUNITY): Payer: Self-pay

## 2022-07-29 DIAGNOSIS — R0989 Other specified symptoms and signs involving the circulatory and respiratory systems: Secondary | ICD-10-CM | POA: Diagnosis not present

## 2022-07-29 DIAGNOSIS — J9601 Acute respiratory failure with hypoxia: Secondary | ICD-10-CM | POA: Diagnosis not present

## 2022-07-29 DIAGNOSIS — L89326 Pressure-induced deep tissue damage of left buttock: Secondary | ICD-10-CM | POA: Diagnosis not present

## 2022-07-29 DIAGNOSIS — C9000 Multiple myeloma not having achieved remission: Secondary | ICD-10-CM | POA: Diagnosis not present

## 2022-07-29 DIAGNOSIS — E46 Unspecified protein-calorie malnutrition: Secondary | ICD-10-CM | POA: Diagnosis not present

## 2022-07-29 DIAGNOSIS — K5730 Diverticulosis of large intestine without perforation or abscess without bleeding: Secondary | ICD-10-CM | POA: Diagnosis not present

## 2022-07-29 DIAGNOSIS — E441 Mild protein-calorie malnutrition: Secondary | ICD-10-CM | POA: Diagnosis not present

## 2022-07-29 DIAGNOSIS — R140 Abdominal distension (gaseous): Secondary | ICD-10-CM | POA: Diagnosis not present

## 2022-07-29 DIAGNOSIS — Z5982 Transportation insecurity: Secondary | ICD-10-CM

## 2022-07-29 DIAGNOSIS — D630 Anemia in neoplastic disease: Secondary | ICD-10-CM | POA: Diagnosis present

## 2022-07-29 DIAGNOSIS — M25561 Pain in right knee: Secondary | ICD-10-CM | POA: Diagnosis not present

## 2022-07-29 DIAGNOSIS — E872 Acidosis, unspecified: Secondary | ICD-10-CM | POA: Diagnosis present

## 2022-07-29 DIAGNOSIS — R109 Unspecified abdominal pain: Secondary | ICD-10-CM | POA: Diagnosis not present

## 2022-07-29 DIAGNOSIS — E8581 Light chain (AL) amyloidosis: Secondary | ICD-10-CM | POA: Diagnosis present

## 2022-07-29 DIAGNOSIS — D649 Anemia, unspecified: Secondary | ICD-10-CM | POA: Diagnosis present

## 2022-07-29 DIAGNOSIS — C799 Secondary malignant neoplasm of unspecified site: Secondary | ICD-10-CM | POA: Diagnosis present

## 2022-07-29 DIAGNOSIS — K5981 Ogilvie syndrome: Principal | ICD-10-CM

## 2022-07-29 DIAGNOSIS — E559 Vitamin D deficiency, unspecified: Secondary | ICD-10-CM | POA: Diagnosis not present

## 2022-07-29 DIAGNOSIS — R9431 Abnormal electrocardiogram [ECG] [EKG]: Secondary | ICD-10-CM | POA: Diagnosis not present

## 2022-07-29 DIAGNOSIS — M7989 Other specified soft tissue disorders: Secondary | ICD-10-CM | POA: Diagnosis not present

## 2022-07-29 DIAGNOSIS — Z7982 Long term (current) use of aspirin: Secondary | ICD-10-CM

## 2022-07-29 DIAGNOSIS — E440 Moderate protein-calorie malnutrition: Secondary | ICD-10-CM | POA: Diagnosis present

## 2022-07-29 DIAGNOSIS — I878 Other specified disorders of veins: Secondary | ICD-10-CM | POA: Diagnosis present

## 2022-07-29 DIAGNOSIS — E861 Hypovolemia: Secondary | ICD-10-CM | POA: Diagnosis present

## 2022-07-29 DIAGNOSIS — N179 Acute kidney failure, unspecified: Secondary | ICD-10-CM | POA: Diagnosis not present

## 2022-07-29 DIAGNOSIS — I129 Hypertensive chronic kidney disease with stage 1 through stage 4 chronic kidney disease, or unspecified chronic kidney disease: Secondary | ICD-10-CM | POA: Diagnosis present

## 2022-07-29 DIAGNOSIS — Z1152 Encounter for screening for COVID-19: Secondary | ICD-10-CM

## 2022-07-29 DIAGNOSIS — I82409 Acute embolism and thrombosis of unspecified deep veins of unspecified lower extremity: Secondary | ICD-10-CM | POA: Diagnosis not present

## 2022-07-29 DIAGNOSIS — Z7401 Bed confinement status: Secondary | ICD-10-CM | POA: Diagnosis not present

## 2022-07-29 DIAGNOSIS — L03116 Cellulitis of left lower limb: Secondary | ICD-10-CM | POA: Diagnosis present

## 2022-07-29 DIAGNOSIS — L03115 Cellulitis of right lower limb: Secondary | ICD-10-CM | POA: Diagnosis present

## 2022-07-29 DIAGNOSIS — R7303 Prediabetes: Secondary | ICD-10-CM | POA: Diagnosis present

## 2022-07-29 DIAGNOSIS — K6389 Other specified diseases of intestine: Secondary | ICD-10-CM | POA: Diagnosis not present

## 2022-07-29 DIAGNOSIS — N1831 Chronic kidney disease, stage 3a: Secondary | ICD-10-CM | POA: Diagnosis not present

## 2022-07-29 DIAGNOSIS — L89311 Pressure ulcer of right buttock, stage 1: Secondary | ICD-10-CM | POA: Diagnosis present

## 2022-07-29 DIAGNOSIS — R0600 Dyspnea, unspecified: Secondary | ICD-10-CM

## 2022-07-29 DIAGNOSIS — R0602 Shortness of breath: Secondary | ICD-10-CM | POA: Diagnosis not present

## 2022-07-29 DIAGNOSIS — I82532 Chronic embolism and thrombosis of left popliteal vein: Secondary | ICD-10-CM | POA: Diagnosis present

## 2022-07-29 DIAGNOSIS — Z7189 Other specified counseling: Secondary | ICD-10-CM | POA: Diagnosis not present

## 2022-07-29 DIAGNOSIS — E8809 Other disorders of plasma-protein metabolism, not elsewhere classified: Secondary | ICD-10-CM | POA: Diagnosis present

## 2022-07-29 DIAGNOSIS — Z452 Encounter for adjustment and management of vascular access device: Secondary | ICD-10-CM | POA: Diagnosis not present

## 2022-07-29 DIAGNOSIS — E860 Dehydration: Secondary | ICD-10-CM | POA: Diagnosis present

## 2022-07-29 DIAGNOSIS — L89321 Pressure ulcer of left buttock, stage 1: Secondary | ICD-10-CM | POA: Diagnosis present

## 2022-07-29 DIAGNOSIS — A040 Enteropathogenic Escherichia coli infection: Secondary | ICD-10-CM | POA: Diagnosis not present

## 2022-07-29 DIAGNOSIS — L89613 Pressure ulcer of right heel, stage 3: Secondary | ICD-10-CM | POA: Diagnosis not present

## 2022-07-29 DIAGNOSIS — K4020 Bilateral inguinal hernia, without obstruction or gangrene, not specified as recurrent: Secondary | ICD-10-CM | POA: Diagnosis present

## 2022-07-29 DIAGNOSIS — Z6832 Body mass index (BMI) 32.0-32.9, adult: Secondary | ICD-10-CM

## 2022-07-29 DIAGNOSIS — I82412 Acute embolism and thrombosis of left femoral vein: Secondary | ICD-10-CM | POA: Diagnosis present

## 2022-07-29 DIAGNOSIS — R609 Edema, unspecified: Secondary | ICD-10-CM | POA: Diagnosis not present

## 2022-07-29 DIAGNOSIS — K5939 Other megacolon: Secondary | ICD-10-CM | POA: Diagnosis not present

## 2022-07-29 DIAGNOSIS — R059 Cough, unspecified: Secondary | ICD-10-CM | POA: Diagnosis not present

## 2022-07-29 DIAGNOSIS — I2693 Single subsegmental pulmonary embolism without acute cor pulmonale: Secondary | ICD-10-CM | POA: Diagnosis not present

## 2022-07-29 DIAGNOSIS — K56609 Unspecified intestinal obstruction, unspecified as to partial versus complete obstruction: Secondary | ICD-10-CM | POA: Diagnosis not present

## 2022-07-29 DIAGNOSIS — I10 Essential (primary) hypertension: Secondary | ICD-10-CM | POA: Diagnosis not present

## 2022-07-29 DIAGNOSIS — K5669 Other partial intestinal obstruction: Secondary | ICD-10-CM | POA: Diagnosis not present

## 2022-07-29 DIAGNOSIS — R062 Wheezing: Secondary | ICD-10-CM | POA: Diagnosis not present

## 2022-07-29 DIAGNOSIS — R197 Diarrhea, unspecified: Secondary | ICD-10-CM | POA: Diagnosis not present

## 2022-07-29 DIAGNOSIS — Z79899 Other long term (current) drug therapy: Secondary | ICD-10-CM

## 2022-07-29 DIAGNOSIS — I2699 Other pulmonary embolism without acute cor pulmonale: Secondary | ICD-10-CM | POA: Diagnosis present

## 2022-07-29 DIAGNOSIS — M1711 Unilateral primary osteoarthritis, right knee: Secondary | ICD-10-CM | POA: Diagnosis present

## 2022-07-29 DIAGNOSIS — L89316 Pressure-induced deep tissue damage of right buttock: Secondary | ICD-10-CM | POA: Diagnosis not present

## 2022-07-29 DIAGNOSIS — J690 Pneumonitis due to inhalation of food and vomit: Secondary | ICD-10-CM | POA: Diagnosis not present

## 2022-07-29 DIAGNOSIS — J441 Chronic obstructive pulmonary disease with (acute) exacerbation: Secondary | ICD-10-CM | POA: Diagnosis not present

## 2022-07-29 DIAGNOSIS — E669 Obesity, unspecified: Secondary | ICD-10-CM | POA: Diagnosis present

## 2022-07-29 DIAGNOSIS — C7951 Secondary malignant neoplasm of bone: Secondary | ICD-10-CM | POA: Diagnosis not present

## 2022-07-29 DIAGNOSIS — I700 Atherosclerosis of aorta: Secondary | ICD-10-CM | POA: Diagnosis not present

## 2022-07-29 DIAGNOSIS — M6281 Muscle weakness (generalized): Secondary | ICD-10-CM | POA: Diagnosis not present

## 2022-07-29 DIAGNOSIS — K219 Gastro-esophageal reflux disease without esophagitis: Secondary | ICD-10-CM | POA: Diagnosis not present

## 2022-07-29 DIAGNOSIS — R4701 Aphasia: Secondary | ICD-10-CM | POA: Diagnosis not present

## 2022-07-29 DIAGNOSIS — K567 Ileus, unspecified: Secondary | ICD-10-CM | POA: Diagnosis not present

## 2022-07-29 DIAGNOSIS — E876 Hypokalemia: Secondary | ICD-10-CM | POA: Diagnosis not present

## 2022-07-29 DIAGNOSIS — Z4659 Encounter for fitting and adjustment of other gastrointestinal appliance and device: Secondary | ICD-10-CM | POA: Diagnosis not present

## 2022-07-29 DIAGNOSIS — R531 Weakness: Secondary | ICD-10-CM | POA: Diagnosis not present

## 2022-07-29 DIAGNOSIS — R079 Chest pain, unspecified: Secondary | ICD-10-CM | POA: Diagnosis not present

## 2022-07-29 DIAGNOSIS — H409 Unspecified glaucoma: Secondary | ICD-10-CM | POA: Insufficient documentation

## 2022-07-29 DIAGNOSIS — I2609 Other pulmonary embolism with acute cor pulmonale: Secondary | ICD-10-CM | POA: Diagnosis not present

## 2022-07-29 DIAGNOSIS — S91001A Unspecified open wound, right ankle, initial encounter: Secondary | ICD-10-CM | POA: Diagnosis not present

## 2022-07-29 DIAGNOSIS — I82432 Acute embolism and thrombosis of left popliteal vein: Secondary | ICD-10-CM | POA: Diagnosis not present

## 2022-07-29 HISTORY — DX: Other pulmonary embolism without acute cor pulmonale: I26.99

## 2022-07-29 HISTORY — DX: Cellulitis of right lower limb: L03.115

## 2022-07-29 LAB — BASIC METABOLIC PANEL
Anion gap: 9 (ref 5–15)
BUN: 11 mg/dL (ref 8–23)
CO2: 12 mmol/L — ABNORMAL LOW (ref 22–32)
Calcium: 6.9 mg/dL — ABNORMAL LOW (ref 8.9–10.3)
Chloride: 119 mmol/L — ABNORMAL HIGH (ref 98–111)
Creatinine, Ser: 1.83 mg/dL — ABNORMAL HIGH (ref 0.61–1.24)
GFR, Estimated: 37 mL/min — ABNORMAL LOW (ref >60)
Glucose, Bld: 94 mg/dL (ref 70–99)
Potassium: 3.3 mmol/L — ABNORMAL LOW (ref 3.5–5.1)
Sodium: 140 mmol/L (ref 135–145)

## 2022-07-29 LAB — BRAIN NATRIURETIC PEPTIDE: B Natriuretic Peptide: 145.9 pg/mL — ABNORMAL HIGH (ref 0.0–100.0)

## 2022-07-29 LAB — CBC WITH DIFFERENTIAL/PLATELET
Abs Immature Granulocytes: 0.01 10*3/uL (ref 0.00–0.07)
Basophils Absolute: 0 10*3/uL (ref 0.0–0.1)
Basophils Relative: 0 %
Eosinophils Absolute: 0.2 10*3/uL (ref 0.0–0.5)
Eosinophils Relative: 4 %
HCT: 29.8 % — ABNORMAL LOW (ref 39.0–52.0)
Hemoglobin: 9.9 g/dL — ABNORMAL LOW (ref 13.0–17.0)
Immature Granulocytes: 0 %
Lymphocytes Relative: 33 %
Lymphs Abs: 2 10*3/uL (ref 0.7–4.0)
MCH: 31.9 pg (ref 26.0–34.0)
MCHC: 33.2 g/dL (ref 30.0–36.0)
MCV: 96.1 fL (ref 80.0–100.0)
Monocytes Absolute: 1 10*3/uL (ref 0.1–1.0)
Monocytes Relative: 17 %
Neutro Abs: 2.8 10*3/uL (ref 1.7–7.7)
Neutrophils Relative %: 46 %
Platelets: 252 10*3/uL (ref 150–400)
RBC: 3.1 MIL/uL — ABNORMAL LOW (ref 4.22–5.81)
RDW: 18.8 % — ABNORMAL HIGH (ref 11.5–15.5)
WBC: 6 10*3/uL (ref 4.0–10.5)
nRBC: 0 % (ref 0.0–0.2)

## 2022-07-29 LAB — GLUCOSE, CAPILLARY
Glucose-Capillary: 114 mg/dL — ABNORMAL HIGH (ref 70–99)
Glucose-Capillary: 138 mg/dL — ABNORMAL HIGH (ref 70–99)

## 2022-07-29 LAB — URINALYSIS, ROUTINE W REFLEX MICROSCOPIC
Bilirubin Urine: NEGATIVE
Glucose, UA: NEGATIVE mg/dL
Hgb urine dipstick: NEGATIVE
Ketones, ur: NEGATIVE mg/dL
Leukocytes,Ua: NEGATIVE
Nitrite: NEGATIVE
Protein, ur: NEGATIVE mg/dL
Specific Gravity, Urine: 1.02 (ref 1.005–1.030)
pH: 5 (ref 5.0–8.0)

## 2022-07-29 LAB — BLOOD GAS, VENOUS
Acid-base deficit: 14.8 mmol/L — ABNORMAL HIGH (ref 0.0–2.0)
Bicarbonate: 12.2 mmol/L — ABNORMAL LOW (ref 20.0–28.0)
O2 Saturation: 38.4 %
Patient temperature: 37
pCO2, Ven: 32 mmHg — ABNORMAL LOW (ref 44–60)
pH, Ven: 7.19 — CL (ref 7.25–7.43)
pO2, Ven: <31 mmHg — CL (ref 32–45)

## 2022-07-29 LAB — STREP PNEUMONIAE URINARY ANTIGEN: Strep Pneumo Urinary Antigen: NEGATIVE

## 2022-07-29 LAB — TROPONIN I (HIGH SENSITIVITY)
Troponin I (High Sensitivity): 36 ng/L — ABNORMAL HIGH (ref <18)
Troponin I (High Sensitivity): 32 ng/L — ABNORMAL HIGH (ref <18)

## 2022-07-29 LAB — HEPARIN LEVEL (UNFRACTIONATED): Heparin Unfractionated: >1.1 IU/mL — ABNORMAL HIGH (ref 0.30–0.70)

## 2022-07-29 LAB — LACTIC ACID, PLASMA
Lactic Acid, Venous: 2.1 mmol/L (ref 0.5–1.9)
Lactic Acid, Venous: 1.8 mmol/L (ref 0.5–1.9)

## 2022-07-29 LAB — MRSA NEXT GEN BY PCR, NASAL: MRSA by PCR Next Gen: NOT DETECTED

## 2022-07-29 LAB — SARS CORONAVIRUS 2 BY RT PCR: SARS Coronavirus 2 by RT PCR: NEGATIVE

## 2022-07-29 LAB — MAGNESIUM: Magnesium: 1.4 mg/dL — ABNORMAL LOW (ref 1.7–2.4)

## 2022-07-29 MED ORDER — METOPROLOL TARTRATE 5 MG/5ML IV SOLN
5.0000 mg | Freq: Three times a day (TID) | INTRAVENOUS | Status: DC | PRN
  Administered 2022-08-01: 5 mg via INTRAVENOUS
  Filled 2022-07-29 (×2): qty 5

## 2022-07-29 MED ORDER — PIPERACILLIN-TAZOBACTAM 3.375 G IVPB 30 MIN
3.3750 g | Freq: Once | INTRAVENOUS | Status: AC
  Administered 2022-07-29: 3.375 g via INTRAVENOUS
  Filled 2022-07-29: qty 50

## 2022-07-29 MED ORDER — HEPARIN (PORCINE) 25000 UT/250ML-% IV SOLN
1350.0000 [IU]/h | INTRAVENOUS | Status: DC
  Administered 2022-07-29: 1350 [IU]/h via INTRAVENOUS
  Filled 2022-07-29 (×2): qty 250

## 2022-07-29 MED ORDER — SODIUM CHLORIDE (PF) 0.9 % IJ SOLN
INTRAMUSCULAR | Status: AC
  Filled 2022-07-29: qty 50

## 2022-07-29 MED ORDER — PROCHLORPERAZINE EDISYLATE 10 MG/2ML IJ SOLN: 5.0000 mg | INTRAMUSCULAR | Status: DC | PRN

## 2022-07-29 MED ORDER — IOHEXOL 350 MG/ML SOLN
75.0000 mL | Freq: Once | INTRAVENOUS | Status: AC | PRN
  Administered 2022-07-29: 100 mL via INTRAVENOUS

## 2022-07-29 MED ORDER — ACETAMINOPHEN 650 MG RE SUPP: 650.0000 mg | Freq: Four times a day (QID) | RECTAL | Status: DC | PRN

## 2022-07-29 MED ORDER — POTASSIUM CHLORIDE 10 MEQ/100ML IV SOLN
10.0000 meq | INTRAVENOUS | Status: AC
  Administered 2022-07-29 (×6): 10 meq via INTRAVENOUS
  Filled 2022-07-29 (×6): qty 100

## 2022-07-29 MED ORDER — DEXAMETHASONE SODIUM PHOSPHATE 10 MG/ML IJ SOLN
10.0000 mg | Freq: Once | INTRAMUSCULAR | Status: AC
  Administered 2022-07-29: 10 mg via INTRAVENOUS
  Filled 2022-07-29: qty 1

## 2022-07-29 MED ORDER — PANTOPRAZOLE SODIUM 40 MG IV SOLR
40.0000 mg | INTRAVENOUS | Status: DC
  Administered 2022-07-29 – 2022-08-03 (×6): 40 mg via INTRAVENOUS
  Filled 2022-07-29 (×6): qty 10

## 2022-07-29 MED ORDER — FENTANYL CITRATE PF 50 MCG/ML IJ SOSY
25.0000 ug | PREFILLED_SYRINGE | Freq: Once | INTRAMUSCULAR | Status: AC
  Administered 2022-07-29: 25 ug via INTRAVENOUS
  Filled 2022-07-29: qty 1

## 2022-07-29 MED ORDER — METRONIDAZOLE 500 MG/100ML IV SOLN
500.0000 mg | Freq: Two times a day (BID) | INTRAVENOUS | Status: DC
  Administered 2022-07-29 (×2): 500 mg via INTRAVENOUS
  Filled 2022-07-29 (×3): qty 100

## 2022-07-29 MED ORDER — VANCOMYCIN HCL 1500 MG/300ML IV SOLN: 1500.0000 mg | INTRAVENOUS | Status: DC

## 2022-07-29 MED ORDER — FUROSEMIDE 10 MG/ML IJ SOLN: 20.0000 mg | Freq: Once | INTRAMUSCULAR | Status: DC

## 2022-07-29 MED ORDER — MAGNESIUM SULFATE 2 GM/50ML IV SOLN
2.0000 g | Freq: Once | INTRAVENOUS | Status: AC
  Administered 2022-07-29: 2 g via INTRAVENOUS
  Filled 2022-07-29: qty 50

## 2022-07-29 MED ORDER — SODIUM CHLORIDE 0.9 % IV SOLN
2.0000 g | Freq: Two times a day (BID) | INTRAVENOUS | Status: DC
  Administered 2022-07-29 – 2022-07-30 (×2): 2 g via INTRAVENOUS
  Filled 2022-07-29 (×2): qty 12.5

## 2022-07-29 MED ORDER — HYDROMORPHONE HCL 1 MG/ML IJ SOLN: 0.5000 mg | INTRAMUSCULAR | Status: DC | PRN

## 2022-07-29 MED ORDER — VANCOMYCIN HCL 2000 MG/400ML IV SOLN
2000.0000 mg | Freq: Once | INTRAVENOUS | Status: AC
  Administered 2022-07-29: 2000 mg via INTRAVENOUS
  Filled 2022-07-29: qty 400

## 2022-07-29 MED ORDER — ONDANSETRON HCL 4 MG/2ML IJ SOLN
4.0000 mg | Freq: Four times a day (QID) | INTRAMUSCULAR | Status: DC | PRN
Start: 2022-07-29 — End: 2022-07-29

## 2022-07-29 MED ORDER — HEPARIN (PORCINE) 25000 UT/250ML-% IV SOLN
1150.0000 [IU]/h | INTRAVENOUS | Status: DC
  Administered 2022-07-30: 1150 [IU]/h via INTRAVENOUS
  Filled 2022-07-29: qty 250

## 2022-07-29 MED ORDER — HEPARIN BOLUS VIA INFUSION
2500.0000 [IU] | Freq: Once | INTRAVENOUS | Status: AC
  Administered 2022-07-29: 2500 [IU] via INTRAVENOUS
  Filled 2022-07-29: qty 2500

## 2022-07-29 MED ORDER — ACETAMINOPHEN 325 MG PO TABS
650.0000 mg | ORAL_TABLET | Freq: Four times a day (QID) | ORAL | Status: DC | PRN
  Administered 2022-07-31: 650 mg via ORAL
  Filled 2022-07-29: qty 2

## 2022-07-29 MED ORDER — CALCIUM GLUCONATE-NACL 1-0.675 GM/50ML-% IV SOLN
1.0000 g | Freq: Once | INTRAVENOUS | Status: AC
  Administered 2022-07-29: 1000 mg via INTRAVENOUS
  Filled 2022-07-29: qty 50

## 2022-07-29 MED ORDER — CHLORHEXIDINE GLUCONATE CLOTH 2 % EX PADS
6.0000 | MEDICATED_PAD | Freq: Every day | CUTANEOUS | Status: DC
  Administered 2022-07-29 – 2022-07-30 (×2): 6 via TOPICAL

## 2022-07-29 MED ORDER — IPRATROPIUM-ALBUTEROL 0.5-2.5 (3) MG/3ML IN SOLN
3.0000 mL | Freq: Once | RESPIRATORY_TRACT | Status: AC
  Administered 2022-07-29: 3 mL via RESPIRATORY_TRACT
  Filled 2022-07-29: qty 3

## 2022-07-29 MED ORDER — ORAL CARE MOUTH RINSE: 15.0000 mL | OROMUCOSAL | Status: DC | PRN

## 2022-07-29 MED ORDER — ONDANSETRON HCL 4 MG PO TABS: 4.0000 mg | ORAL_TABLET | Freq: Four times a day (QID) | ORAL | Status: DC | PRN

## 2022-07-29 NOTE — ED Provider Notes (Signed)
[place] [facility]ERGENCY DEPARTMENT AT [facility] Provider Note   CSN: [id number] Arrival date & time: 07/29/22  6962     History  No chief complaint on file.   Frank Hernandez is a 82 y.o. male. With pmh multiple myeloma on mab therapy, CKD, HTN, pre-DM presenting with difficulty breathing, wheezing and coughing over the past 2 to 3 days.  Patient and patient's wife helped provide history together.  Patient is denying any chest pain but does complain of some abdominal discomfort.  Wife notes that he has had significantly decreased p.o. intake.  He has had no vomiting and notes very small loose stools.  No fevers or chills noted but currently on an antibiotic she notes for a cellulitis and wound of his right leg.  He has had increased swelling of bilateral lower extremities and increasing generalized fatigue and weakness.  Patient's wife finally called EMS today due to all ongoing symptoms.  HPI     Home Medications Prior to Admission medications   Medication Sig Start Date End Date Taking? Authorizing Provider  acyclovir (ZOVIRAX) 400 MG tablet Take 1 tablet (400 mg total) by mouth daily. 06/25/22   Johney Maine, MD  aspirin EC 81 MG tablet Take 1 tablet (81 mg total) by mouth daily. Swallow whole. 06/02/22   Rodolph Bong, MD  feeding supplement (ENSURE ENLIVE / ENSURE PLUS) LIQD Take 237 mLs by mouth 2 (two) times daily between meals. Patient taking differently: Take 237 mLs by mouth daily. May use Boost 04/03/22   Almon Hercules, MD  ferrous sulfate 325 (65 FE) MG tablet Take 1 tablet (325 mg total) by mouth 2 (two) times daily with a meal. 06/01/22   Rodolph Bong, MD  loperamide (IMODIUM) 2 MG capsule Take 1 capsule (2 mg total) by mouth as needed for diarrhea or loose stools. 06/01/22   Rodolph Bong, MD  mirtazapine (REMERON) 15 MG tablet Take 1 tablet (15 mg total) by mouth at bedtime. 07/09/22   Johney Maine, MD  ondansetron (ZOFRAN) 8 MG tablet  Take 8 mg by mouth 30 to 60 min prior to Cyclophosphamide administration then take 8 mg every 8 hrs as needed for nausea and vomiting. 05/24/22   Johney Maine, MD  potassium chloride SA (KLOR-CON M) 20 MEQ tablet Take 2 tablets (40 mEq total) by mouth 2 (two) times daily. 07/09/22   Johney Maine, MD  prochlorperazine (COMPAZINE) 10 MG tablet Take 1 tablet (10 mg total) by mouth every 6 (six) hours as needed for nausea or vomiting. 05/24/22   Johney Maine, MD  Vitamin D, Ergocalciferol, (DRISDOL) 1.25 MG (50000 UNIT) CAPS capsule Take 1 capsule (50,000 Units total) by mouth once a week. 07/09/22   Johney Maine, MD      Allergies    Patient has no known allergies.    Review of Systems   Review of Systems  Physical Exam Updated Vital Signs BP (!) 146/74 (BP Location: Right Arm) Comment: Simultaneous filing. User may not have seen previous data. Comment (BP Location): Simultaneous filing. User may not have seen previous data.  Pulse 88   Temp 98.6 F (37 C) (Oral) Comment (Src): Simultaneous filing. User may not have seen previous data.  Resp (!) 22   Ht 5\' 9"  (1.753 m)   Wt 90.3 kg   SpO2 100%   BMI 29.39 kg/m  Physical Exam Constitutional: Alert and oriented.  Ill-appearing speaking in broken sentences with  audible wheezing Eyes: Conjunctivae are normal. ENT      Head: Normocephalic and atraumatic.      Neck: No stridor. Cardiovascular: S1, S2, regular rate and rhythm, equal palpable radial pulses Respiratory: Normal respiratory effort. Breath sounds are normal. Gastrointestinal: Distended but soft and diffusely tender with significant tenderness on palpation of the left inguinal region but no obvious large hernia however palpable bowel on this area.  There is edema and swelling around the right inguinal region but no significant tenderness to the touch. Musculoskeletal:  Significant equal 3+ pitting edema bilateral lower extremities with venous stasis  changes.  Pressure ulcer visualized on posterior right calcaneus with minimal white discharge.  Mild surrounding erythema and warmth.  Tender to light touch. Neurologic: Normal speech and language.  Diffuse weakness on exam with 4 out of 5 strength.  Sensation grossly intact. Skin: Venous stasis changes of bilateral lower extremities and pressure ulcer noted to right posterior calcaneus Psychiatric: Mood and affect are normal. Speech and behavior are normal.  ED Results / Procedures / Treatments   Labs (all labs ordered are listed, but only abnormal results are displayed) Labs Reviewed  BRAIN NATRIURETIC PEPTIDE - Abnormal; Notable for the following components:      Result Value   B Natriuretic Peptide 145.9 (*)    All other components within normal limits  CBC WITH DIFFERENTIAL/PLATELET - Abnormal; Notable for the following components:   RBC 3.10 (*)    Hemoglobin 9.9 (*)    HCT 29.8 (*)    RDW 18.8 (*)    All other components within normal limits  BASIC METABOLIC PANEL - Abnormal; Notable for the following components:   Potassium 3.3 (*)    Chloride 119 (*)    CO2 12 (*)    Creatinine, Ser 1.83 (*)    Calcium 6.9 (*)    GFR, Estimated 37 (*)    All other components within normal limits  BLOOD GAS, VENOUS - Abnormal; Notable for the following components:   pH, Ven 7.19 (*)    pCO2, Ven 32 (*)    pO2, Ven <31 (*)    Bicarbonate 12.2 (*)    Acid-base deficit 14.8 (*)    All other components within normal limits  MAGNESIUM - Abnormal; Notable for the following components:   Magnesium 1.4 (*)    All other components within normal limits  LACTIC ACID, PLASMA - Abnormal; Notable for the following components:   Lactic Acid, Venous 2.1 (*)    All other components within normal limits  TROPONIN I (HIGH SENSITIVITY) - Abnormal; Notable for the following components:   Troponin I (High Sensitivity) 36 (*)    All other components within normal limits  SARS CORONAVIRUS 2 BY RT PCR   CULTURE, BLOOD (ROUTINE X 2)  CULTURE, BLOOD (ROUTINE X 2)  LACTIC ACID, PLASMA  URINALYSIS, ROUTINE W REFLEX MICROSCOPIC  TROPONIN I (HIGH SENSITIVITY)    EKG EKG Interpretation  Date/Time:  Thursday Jul 29 2022 08:20:23 EDT Ventricular Rate:  88 PR Interval:  171 QRS Duration: 80 QT Interval:  436 QTC Calculation: 528 R Axis:   19 Text Interpretation: Sinus rhythm Abnormal R-wave progression, early transition Minimal ST depression, anterior leads Prolonged QT interval Confirmed by Vivien Rossetti ([id number]) on 07/29/2022 8:24:27 AM  Radiology DG Abd 1 View  Result Date: 07/29/2022 CLINICAL DATA:  NG tube placement EXAM: ABDOMEN - 1 VIEW COMPARISON:  Same day CT abdomen/pelvis FINDINGS: The enteric catheter tip and sidehole project over the expected  location of the stomach. There is marked gaseous distention of the large bowel as seen on same-day CT. There is no definite free intraperitoneal air, within the confines of supine technique. IMPRESSION: Enteric catheter tip and sidehole project over the stomach. Electronically Signed   By: Lesia Hausen M.D.   On: 07/29/2022 10:34   DG Chest 2 View  Result Date: 07/29/2022 CLINICAL DATA:  shortness of breath, wheezing EXAM: CHEST - 2 VIEW COMPARISON:  PET-CT March 21, 24. FINDINGS: The heart size and mediastinal contours are within normal limits. Both lungs are clear. No visible pleural effusions or pneumothorax. The visualized skeletal structures are unremarkable. Gaseous distension of visualized bowel loops in the upper abdomen. Recommend dedicated KUB to further evaluate. IMPRESSION: 1. No active cardiopulmonary disease. 2. Gaseous distension of visualized bowel loops in the upper abdomen. Recommend dedicated KUB to further evaluate. Electronically Signed   By: Feliberto Harts M.D.   On: 07/29/2022 08:56    Procedures .Critical Care  Performed by: Mardene Sayer, MD Authorized by: Mardene Sayer, MD   Critical care provider  statement:    Critical care time (minutes):  60   Critical care was necessary to treat or prevent imminent or life-threatening deterioration of the following conditions:  Metabolic crisis, respiratory failure and sepsis   Critical care was time spent personally by me on the following activities:  Development of treatment plan with patient or surrogate, discussions with consultants, evaluation of patient's response to treatment, examination of patient, ordering and review of laboratory studies, ordering and review of radiographic studies, ordering and performing treatments and interventions, pulse oximetry, re-evaluation of patient's condition, review of old charts and obtaining history from patient or surrogate   Care discussed with: admitting provider       Medications Ordered in ED Medications  vancomycin (VANCOREADY) IVPB 2000 mg/400 mL (2,000 mg Intravenous New Bag/Given 07/29/22 1044)  magnesium sulfate IVPB 2 g 50 mL (2 g Intravenous New Bag/Given 07/29/22 1112)  heparin ADULT infusion 100 units/mL (25000 units/257mL) (has no administration in time range)  heparin bolus via infusion 2,500 Units (has no administration in time range)  ipratropium-albuterol (DUONEB) 0.5-2.5 (3) MG/3ML nebulizer solution 3 mL (3 mLs Nebulization Given 07/29/22 0900)  dexamethasone (DECADRON) injection 10 mg (10 mg Intravenous Given 07/29/22 0900)  fentaNYL (SUBLIMAZE) injection 25 mcg (25 mcg Intravenous Given 07/29/22 0914)  piperacillin-tazobactam (ZOSYN) IVPB 3.375 g (0 g Intravenous Stopped 07/29/22 1038)  iohexol (OMNIPAQUE) 350 MG/ML injection 75 mL (100 mLs Intravenous Contrast Given 07/29/22 0949)    ED Course/ Medical Decision Making/ A&P Clinical Course as of 07/29/22 1117  Thu Jul 29, 2022  1030 Called by radiology- Small lower lobe PE and new large left inguinal hernia with evidence of possible  transition point within. [VB]    Clinical Course User Index [VB] Mardene Sayer, MD                              Medical Decision Making NDREW PIKE is a 82 y.o. male. With pmh multiple myeloma on mab therapy, CKD, HTN, pre-DM presenting with difficulty breathing, wheezing and coughing over the past 2 to 3 days.  On exam, patient appears to be in mild to moderate respiratory distress with audible wheezing associated with rhonchi bilaterally, O2 sat 100% on room air, respiratory rate in the high 20s low 30s with accessory muscle use.  He has significant pitting edema of lower  extremities and venous stasis changes.  His initial vitals were otherwise stable.  Started patient on DuoNeb, Decadron and BiPAP with improvement of work of breathing and symptoms.  On initial chest x-ray which I personally reviewed there is no evidence of pneumothorax, pneumonia or pulmonary edema however notable for significant gaseous distention of upper abdomen.  On abdominal exam, he was noted to be distended and diffusely tender but not peritonitic concerning for bowel obstruction likely contributing to shortness of breath and work of breathing.  NG tube placed for decompression.  Labs notable for normal white blood cell count 6.0, chronic anemia hemoglobin 9.9.  AKI creatinine 1.83 with hypokalemia 3.3 and hypomagnesemia 1.4 with prolonged QTc.  Start repletion of magnesium and potassium.  Lactate 2.1.  [place] acidosis pH 7.19, bicarbonate 12, anion gap 9.  Diarrhea has likely been contributing.  Mildly elevated lactate 2.1.  Started on broad-spectrum antibiotics for coverage of cellulitis of RLE, possible aspiration pneumonia and bowel ischemia.  CTA PE study notable for small distal subsegmental PE started on heparin drip with elevated troponin 36.  Called surgery for concerns of bowel obstruction noted on CTAP with transition point and left inguinal hernia.  Attempted reduction felt bowel unclear if truly reduced or not.  NG tube has been placed as discussed.  Dr. Luisa Hart is Editor, commissioning.  Currently,  planning for medical management.  Spoke with Dr. Robb Matar who will admit patient for further workup and management.  Amount and/or Complexity of Data Reviewed Labs: ordered. Radiology: ordered.  Risk Prescription drug management. Decision regarding hospitalization.     { Final Clinical Impression(s) / ED Diagnoses Final diagnoses:  SBO (small bowel obstruction) (HCC)  Single subsegmental pulmonary embolism without acute cor pulmonale (HCC)  Dyspnea, unspecified type    Rx / DC Orders ED Discharge Orders     None         Mardene Sayer, MD 07/29/22 1117

## 2022-07-29 NOTE — ED Triage Notes (Signed)
Pt bib EMS. Family called due to pt wheezing. EMS heard rhonchi on auscultation. BLE with edema. Is taking abts for cellulitis. Albuterol tx given. VS; T: 134/68, P: 80s, R: 24, SPO2: 100% RA. C/O pain to RLE.

## 2022-07-29 NOTE — H&P (Signed)
History and Physical    Patient: Frank Hernandez UJW:[id number] DOB: [birth year] DOA: 07/29/2022 DOS: the patient was seen and examined on 07/29/2022 PCP: [name] (Inactive)  Patient coming from: Home  Chief Complaint: No chief complaint on file.  HPI: Frank Hernandez is a 82 y.o. male with medical history significant of epistaxis, hypertension, prediabetes, stage IIIa CKD, pathological fracture of the right femur, multiple myeloma not in remission who was brought to the emergency department via EMS due to wheezing, rhonchi.    EMS gave albuterol and route. He has been recently taking antibiotics for cellulitis.   He has had worsening of his feet wounds, particularly on the right. His appetite has been decreased and his abdomen has gotten distended.  He is having small diarrheal bowel movements and still passing gas.  No nausea, emesis, constipation, melena or hematochezia.  No flank pain, dysuria, frequency or hematuria.He denied fever, chills, rhinorrhea, sore throat, wheezing or hemoptysis.  No chest pain, palpitations, diaphoresis, PND, orthopnea or pitting edema of the lower extremities.   No polyuria, polydipsia, polyphagia or blurred vision.   ED course: Initial vital signs were temperature 98.6 F, pulse 87, respirations 22, BP 146/74 mmHg O2 sat 100% on room air.  The patient received dexamethasone 10 mg IVP, fentanyl 25 mcg IVP, magnesium sulfate 2 g IVP, Zosyn 3.375 g IVPB, a DuoNeb and was started on a heparin infusion.  Lab work: CBC showed white count 6.0, hemoglobin 9.9 g/dL platelets 562.  Venous blood gas showed a pH of 7.19, pCO2 of 32 and pO2 less than 31 mmHg.  Bicarbonate was 12.2 and acid-base deficit of 14.8 mmol/L.  BMP showed a sodium 140, potassium 3.3, chloride 119 and CO2 12 mmol/L with an anion gap of 9.  Glucose 94, BUN 11, creatinine 1.83 and calcium 6.9 mg/dL.  Lactic acid is 2.1 then 1.8 mmol/L.  Troponin 136 then 32 ng/L.  BNP 146 pg/mL.  Imaging: 2  view chest radiograph with no active cardiopulmonary disease.  There is gaseous distention of the visualized bowel loops in the upper abdomen.  CTA chest showed left lower lobe segmental pulmonary embolism with overall minimal clot burden.  CT abdomen with dilated colon with air-fluid level seen and scattered colonic diverticula.  Transition staying in the area of the new left inguinal hernia involving the sigmoid colon.  No pneumatosis, free air or fluid.  Small bowel and stomach are nondilated but there is an enteric tube.  Separate large known right inguinal hernia involving small bowel and significant portion of the urinary bladder.  Lytic bone lesions consistent with known history of multiple myeloma.   Review of Systems: As mentioned in the history of present illness. All other systems reviewed and are negative.  Past Medical History:  Diagnosis Date   Bleeding from the nose    Hypertension    Pre-diabetes    Past Surgical History:  Procedure Laterality Date   COLONOSCOPY     INTRAMEDULLARY (IM) NAIL INTERTROCHANTERIC Left 06/11/2020   Procedure: INTRAMEDULLARY (IM) NAIL INTERTROCHANTRIC;  Surgeon: Roby Lofts, MD;  Location: [facility] OR;  Service: Orthopedics;  Laterality: Left;   INTRAMEDULLARY (IM) NAIL INTERTROCHANTERIC Right 04/17/2022   Procedure: INTRAMEDULLARY (IM) NAIL INTERTROCHANTERIC;  Surgeon: London Sheer, MD;  Location: [facility] ORS;  Service: Orthopedics;  Laterality: Right;   KNEE ARTHROSCOPY Bilateral    POLYPECTOMY     Social History:  reports that he has never smoked. He has never used smokeless tobacco. He reports that  he does not drink alcohol and does not use drugs.  No Known Allergies  Family History  Problem Relation Age of Onset   Colon cancer Neg Hx     Prior to Admission medications   Medication Sig Start Date End Date Taking? Authorizing Provider  acetaminophen (TYLENOL) 500 MG tablet Take 1,000 mg by mouth daily as needed for moderate pain.   Yes [provider]  acyclovir (ZOVIRAX) 400 MG tablet Take 1 tablet (400 mg total) by mouth daily. 06/25/22  Yes Johney Maine, MD  aspirin EC 81 MG tablet Take 1 tablet (81 mg total) by mouth daily. Swallow whole. 06/02/22  Yes Rodolph Bong, MD  feeding supplement (ENSURE ENLIVE / ENSURE PLUS) LIQD Take 237 mLs by mouth 2 (two) times daily between meals. Patient taking differently: Take 237 mLs by mouth daily as needed (nurition). 04/03/22  Yes Almon Hercules, MD  ferrous sulfate 325 (65 FE) MG tablet Take 1 tablet (325 mg total) by mouth 2 (two) times daily with a meal. Patient taking differently: Take 325 mg by mouth 3 (three) times a week. 06/01/22  Yes Rodolph Bong, MD  loperamide (IMODIUM) 2 MG capsule Take 1 capsule (2 mg total) by mouth as needed for diarrhea or loose stools. Patient taking differently: Take 2 mg by mouth in the morning and at bedtime. 06/01/22  Yes Rodolph Bong, MD  potassium chloride SA (KLOR-CON M) 20 MEQ tablet Take 2 tablets (40 mEq total) by mouth 2 (two) times daily. Patient taking differently: Take 20 mEq by mouth 3 (three) times daily. 07/09/22  Yes Johney Maine, MD  Vitamin D, Ergocalciferol, (DRISDOL) 1.25 MG (50000 UNIT) CAPS capsule Take 1 capsule (50,000 Units total) by mouth once a week. 07/09/22  Yes Johney Maine, MD  doxazosin (CARDURA) 2 MG tablet Take 2 mg by mouth daily. Patient not taking: Reported on 07/29/2022 06/22/22   [provider]  mirtazapine (REMERON) 15 MG tablet Take 1 tablet (15 mg total) by mouth at bedtime. Patient not taking: Reported on 07/29/2022 07/09/22   Johney Maine, MD  ondansetron (ZOFRAN) 8 MG tablet Take 8 mg by mouth 30 to 60 min prior to Cyclophosphamide administration then take 8 mg every 8 hrs as needed for nausea and vomiting. Patient not taking: Reported on 07/29/2022 05/24/22   Johney Maine, MD  prochlorperazine (COMPAZINE) 10 MG tablet Take 1 tablet (10 mg total) by mouth  every 6 (six) hours as needed for nausea or vomiting. Patient not taking: Reported on 07/29/2022 05/24/22   Johney Maine, MD    Physical Exam: Vitals:   07/29/22 8295 07/29/22 0903 07/29/22 1145 07/29/22 1156  BP:   114/61   Pulse:  88 79   Resp:  (!) 22 (!) 22   Temp:    98.3 F (36.8 C)  TempSrc:    Oral  SpO2:  100% 100%   Weight: 90.3 kg     Height: 5\' 9"  (1.753 m)      Physical Exam Vitals and nursing note reviewed.  Constitutional:      General: He is awake. He is not in acute distress.    Appearance: Normal appearance. He is overweight. He is ill-appearing.  HENT:     Head: Normocephalic.     Nose: No rhinorrhea.  Eyes:     General: No scleral icterus.    Pupils: Pupils are equal, round, and reactive to light.  Neck:  Vascular: No JVD.  Cardiovascular:     Rate and Rhythm: Normal rate and regular rhythm.     Heart sounds: S1 normal and S2 normal.  Pulmonary:     Breath sounds: Wheezing present.  Abdominal:     General: There is distension.     Palpations: Abdomen is soft.     Tenderness: There is abdominal tenderness.  Musculoskeletal:     Cervical back: Neck supple.     Right lower leg: Edema present.     Left lower leg: Edema present.  Skin:    General: Skin is warm and dry.  Neurological:     General: No focal deficit present.     Mental Status: He is alert and oriented to person, place, and time.  Psychiatric:        Mood and Affect: Mood normal.        Behavior: Behavior normal. Behavior is cooperative.    Data Reviewed:  Results are pending, will review when available.  04/22/2022 TTE IMPRESSIONS    1. Left ventricular ejection fraction, by estimation, is 60 to 65%. The  left ventricle has normal function. The left ventricle has no regional  wall motion abnormalities. Left ventricular diastolic parameters are  indeterminate.   2. Right ventricular systolic function is normal. The right ventricular  size is normal.   3. The mitral  valve is grossly normal. No evidence of mitral valve  regurgitation. No evidence of mitral stenosis.   4. The aortic valve was not well visualized. Aortic valve regurgitation  is mild. Aortic valve sclerosis is present, with no evidence of aortic  valve stenosis.   5. IVC not well visualized.   EKG: Vent. rate 88 BPM PR interval 171 ms QRS duration 80 ms QT/QTcB 436/528 ms P-R-T axes 0 19 -22 Sinus rhythm Abnormal R-wave progression, early transition Minimal ST depression, anterior leads Prolonged QT interval  Assessment and Plan: Principal Problem:   Bowel obstruction (HCC) Inpatient/SDU. Keep NPO. Continue NTG at [facility]. Continue IV fluids. Analgesics as needed. Antiemetics as needed. Pantoprazole 40 mg IVP every 24 hours. Keep electrolytes optimized. Follow-up CBC and CMP in AM. Follow-up imaging in the morning. General surgery input appreciated.  Active Problems:   Acute pulmonary embolism (HCC) Cardiovascularly stable. Continue heparin per pharmacy.   Check echocardiogram.    Cellulitis of both lower extremities Continue vancomycin per pharmacy. Begin cefepime 2 g IVPB every 12 hours. Begin metronidazole 500 mg IVPB every 12 hours. Consult wound and ostomy care.    Hypomagnesemia Supplementing. Follow level as needed.    Hypokalemia Continue potassium supplementation. Follow-up level in the morning.    Prolonged QT interval Avoid QT prolonging meds as possible. KCl has been supplemented. Magnesium sulfate 2 g IVPB now. Keep electrolytes optimized. Check EKG in the morning.    Essential hypertension Currently NPO. Metoprolol 5 mg IVP every 8 hours as needed.    Pre-diabetes CBG monitoring every 6 hours while NPO.    Chronic kidney disease, stage 3a (HCC) Monitor renal function and electrolytes.    Normocytic anemia Monitor hematocrit and hemoglobin. Transfuse as needed.    Multiple myeloma not having achieved remission [facility]) Follow-up with  oncology as an outpatient as scheduled.     Advance Care Planning:   Code Status: Full Code   Consults: [place] [facility] surgery.  Family Communication:   Severity of Illness: The appropriate patient status for this patient is INPATIENT. Inpatient status is judged to be reasonable and necessary in order to  provide the required intensity of service to ensure the patient's safety. The patient's presenting symptoms, physical exam findings, and initial radiographic and laboratory data in the context of their chronic comorbidities is felt to place them at high risk for further clinical deterioration. Furthermore, it is not anticipated that the patient will be medically stable for discharge from the hospital within 2 midnights of admission.   * I certify that at the point of admission it is my clinical judgment that the patient will require inpatient hospital care spanning beyond 2 midnights from the point of admission due to high intensity of service, high risk for further deterioration and high frequency of surveillance required.*  Author: Bobette Mo, MD 07/29/2022 11:58 AM  For on call review [email or web address].   This document was prepared using Dragon voice recognition software and may contain some unintended transcription errors.

## 2022-07-29 NOTE — Progress Notes (Signed)
ANTICOAGULATION CONSULT NOTE - Initial Consult  Pharmacy Consult for heparin Indication: pulmonary embolus  No Known Allergies  Patient Measurements: Height: 5\' 9"  (175.3 cm) Weight: 90.3 kg (199 lb) IBW/kg (Calculated) : 70.7 Heparin Dosing Weight: 88.9 kg  Vital Signs: Temp: 98.6 F (37 C) (05/30 0821) Temp Source: Oral (05/30 0821) BP: 146/74 (05/30 0821) Pulse Rate: 88 (05/30 0903)  Labs: Recent Labs    07/29/22 0835  HGB 9.9*  HCT 29.8*  PLT 252  CREATININE 1.83*  TROPONINIHS 36*    Estimated Creatinine Clearance: 35.2 mL/min (A) (by C-G formula based on SCr of 1.83 mg/dL (H)).   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 Pharmacy consulted to dose heparin for new small distal PE.  SCr 1.83, Hg 9.8, PLT 252.    Goal of Therapy:  Heparin level 0.3-0.7 units/ml Monitor platelets by anticoagulation protocol: Yes   Plan:  Give 2500 units bolus x 1 Start heparin infusion at 1350 units/hr Check anti-Xa level in 8 hours and daily while on heparin Continue to monitor H&H and platelets  Herby Abraham, Pharm.D Use secure chat for questions 07/29/2022 10:54 AM

## 2022-07-29 NOTE — ED Notes (Signed)
CRITICAL VALUE STICKER  CRITICAL VALUE: PH-7.19, PO2-<31  RECEIVER (on-site recipient of call): Lamonte Richer RN  DATE & TIME NOTIFIED: [phone], 07/29/2022  MESSENGER (representative from lab):  MD NOTIFIED: Elpidio Anis, MD  TIME OF NOTIFICATION: 0909  RESPONSE: Orders received.

## 2022-07-29 NOTE — Progress Notes (Addendum)
ANTICOAGULATION CONSULT NOTE - Follow Up Consult  Pharmacy Consult for Heparin Indication: pulmonary embolus  No Known Allergies  Patient Measurements: Height: 5\' 9"  (175.3 cm) Weight: 90.3 kg (199 lb) IBW/kg (Calculated) : 70.7 Heparin Dosing Weight: 88.9 kg  Vital Signs: Temp: 98.1 F (36.7 C) (05/30 2000) Temp Source: Oral (05/30 2000) BP: 150/84 (05/30 2100) Pulse Rate: 69 (05/30 2100)  Labs: Recent Labs    07/29/22 0835 07/29/22 1150 07/29/22 2049  HGB 9.9*  --   --   HCT 29.8*  --   --   PLT 252  --   --   HEPARINUNFRC  --   --  >1.10*  CREATININE 1.83*  --   --   TROPONINIHS 36* 32*  --     Estimated Creatinine Clearance: 35.2 mL/min (A) (by C-G formula based on SCr of 1.83 mg/dL (H)).  Assessment: Anticoag: Hep for PE: Hep level >1.1. Rn reports drawn from opposite arm.  Goal of Therapy:  Heparin level 0.3-0.7 units/ml Monitor platelets by anticoagulation protocol: Yes   Plan:  IV heparin hold x 1 hr and resume at 1150 units/hr Recheck heparin level in 8 hrs. Daily HL and CBC   Willow Shidler S. Merilynn Finland, PharmD, BCPS Clinical Staff Pharmacist [email or web address] Merilynn Finland, Shamona Wirtz Stillinger 07/29/2022,9:31 PM

## 2022-07-29 NOTE — ED Notes (Signed)
ED TO INPATIENT HANDOFF REPORT  ED Nurse Name and Phone #: Bronda Alfred  S Name/Age/Gender Frank Hernandez 82 y.o. male Room/Bed: WA19/WA19  Code Status   Code Status: Prior  Home/SNF/Other Home Patient oriented to: self, time, place, situation Is this baseline? Yes   Triage Complete: Triage complete  Chief Complaint Bowel obstruction (HCC) [K56.609]  Triage Note Pt bib EMS. Family called due to pt wheezing. EMS heard rhonchi on auscultation. BLE with edema. Is taking abts for cellulitis. Albuterol tx given. VS; T: 134/68, P: 80s, R: 24, SPO2: 100% RA. C/O pain to RLE.    Allergies No Known Allergies  Level of Care/Admitting Diagnosis ED Disposition     ED Disposition  Admit   Condition  --   Comment  Hospital Area: [facility] [[id number]]  Level of Care: Stepdown [14]  Admit to SDU based on following criteria: Respiratory Distress:  Frequent assessment and/or intervention to maintain adequate ventilation/respiration, pulmonary toilet, and respiratory treatment.  Admit to SDU based on following criteria: Severe physiological/psychological symptoms:  Any diagnosis requiring assessment & intervention at least every 4 hours on an ongoing basis to obtain desired patient outcomes including stability and rehabilitation  May admit patient to [facility] or [facility] if equivalent level of care is available:: No  Covid Evaluation: Asymptomatic - no recent exposure (last 10 days) testing not required  Diagnosis: Bowel obstruction [facility]) [[id number]]  Admitting Physician: Bobette Mo [[id number]]  Attending Physician: Bobette Mo [[id number]]  Certification:: I certify this patient will need inpatient services for at least 2 midnights  Estimated Length of Stay: 2          B Medical/Surgery History Past Medical History:  Diagnosis Date   Bleeding from the nose    Hypertension    Pre-diabetes    Past Surgical History:  Procedure Laterality Date    COLONOSCOPY     INTRAMEDULLARY (IM) NAIL INTERTROCHANTERIC Left 06/11/2020   Procedure: INTRAMEDULLARY (IM) NAIL INTERTROCHANTRIC;  Surgeon: Roby Lofts, MD;  Location: [facility] OR;  Service: Orthopedics;  Laterality: Left;   INTRAMEDULLARY (IM) NAIL INTERTROCHANTERIC Right 04/17/2022   Procedure: INTRAMEDULLARY (IM) NAIL INTERTROCHANTERIC;  Surgeon: London Sheer, MD;  Location: [facility] ORS;  Service: Orthopedics;  Laterality: Right;   KNEE ARTHROSCOPY Bilateral    POLYPECTOMY       A IV Location/Drains/Wounds Patient Lines/Drains/Airways Status     Active Line/Drains/Airways     Name Placement date Placement time Site Days   Peripheral IV 07/29/22 20 G 1" Anterior;Distal;Right;Upper Antecubital 07/29/22  0820  Antecubital  less than 1   Peripheral IV 07/29/22 22 G 1" Right;Posterior Hand 07/29/22  1129  Hand  less than 1   Pressure Injury 03/29/22 Buttocks Right Stage 1 -  Intact skin with non-blanchable redness of a localized area usually over a bony prominence. 03/29/22  2140  -- 122   Pressure Injury 05/28/22 Heel Right Stage 3 -  Full thickness tissue loss. Subcutaneous fat may be visible but bone, tendon or muscle are NOT exposed. Open wound, broken from pressure & dry skin 05/28/22  1400  -- 62   Wound / Incision (Open or Dehisced) Heel Left --  --  Heel  --            Intake/Output Last 24 hours No intake or output data in the 24 hours ending 07/29/22 1153  Labs/Imaging Results for orders placed or performed during the hospital encounter of 07/29/22 (from the past 48 hour(s))  SARS Coronavirus 2 by RT PCR (hospital order, performed in [facility] hospital lab) *cepheid single result test* Anterior Nasal Swab     Status: None   Collection Time: 07/29/22  8:26 AM   Specimen: Anterior Nasal Swab  Result Value Ref Range   SARS Coronavirus 2 by RT PCR NEGATIVE NEGATIVE    Comment: (NOTE) SARS-CoV-2 target nucleic acids are NOT DETECTED.  The SARS-CoV-2 RNA is generally  detectable in upper and lower respiratory specimens during the acute phase of infection. The lowest concentration of SARS-CoV-2 viral copies this assay can detect is 250 copies / mL. A negative result does not preclude SARS-CoV-2 infection and should not be used as the sole basis for treatment or other patient management decisions.  A negative result may occur with improper specimen collection / handling, submission of specimen other than nasopharyngeal swab, presence of viral mutation(s) within the areas targeted by this assay, and inadequate number of viral copies (<250 copies / mL). A negative result must be combined with clinical observations, patient history, and epidemiological information.  Fact Sheet for Patients:   RoadLapTop.co.za  Fact Sheet for Healthcare Providers: http://kim-miller.com/  This test is not yet approved or  cleared by the [place] FDA and has been authorized for detection and/or diagnosis of SARS-CoV-2 by FDA under an Emergency Use Authorization (EUA).  This EUA will remain in effect (meaning this test can be used) for the duration of the COVID-19 declaration under Section 564(b)(1) of the Act, 21 U.S.C. section 360bbb-3(b)(1), unless the authorization is terminated or revoked sooner.  Performed at [facility], [address], [place], [place] [address]   Brain natriuretic peptide     Status: Abnormal   Collection Time: 07/29/22  8:35 AM  Result Value Ref Range   B Natriuretic Peptide 145.9 (H) 0.0 - 100.0 pg/mL    Comment: Performed at [facility], [address], [place], [place] [address]  CBC with Differential     Status: Abnormal   Collection Time: 07/29/22  8:35 AM  Result Value Ref Range   WBC 6.0 4.0 - 10.5 K/uL   RBC 3.10 (L) 4.22 - 5.81 MIL/uL   Hemoglobin 9.9 (L) 13.0 - 17.0 g/dL   HCT 13.0 (L) 86.5 - 78.4 %   MCV 96.1 80.0 - 100.0 fL   MCH 31.9  26.0 - 34.0 pg   MCHC 33.2 30.0 - 36.0 g/dL   RDW 69.6 (H) 29.5 - 28.4 %   Platelets 252 150 - 400 K/uL   nRBC 0.0 0.0 - 0.2 %   Neutrophils Relative % 46 %   Neutro Abs 2.8 1.7 - 7.7 K/uL   Lymphocytes Relative 33 %   Lymphs Abs 2.0 0.7 - 4.0 K/uL   Monocytes Relative 17 %   Monocytes Absolute 1.0 0.1 - 1.0 K/uL   Eosinophils Relative 4 %   Eosinophils Absolute 0.2 0.0 - 0.5 K/uL   Basophils Relative 0 %   Basophils Absolute 0.0 0.0 - 0.1 K/uL   Immature Granulocytes 0 %   Abs Immature Granulocytes 0.01 0.00 - 0.07 K/uL    Comment: Performed at [facility], [address], [place], [place] [address]  Troponin I (High Sensitivity)     Status: Abnormal   Collection Time: 07/29/22  8:35 AM  Result Value Ref Range   Troponin I (High Sensitivity) 36 (H) <18 ng/L    Comment: (NOTE) Elevated high sensitivity troponin I (hsTnI) values and significant  changes across serial measurements may suggest ACS but many other  chronic and acute conditions are known to elevate hsTnI results.  Refer to the "Links" section for chest pain algorithms and additional  guidance. Performed at [facility], [address] [place], [place] [address]   Basic metabolic panel     Status: Abnormal   Collection Time: 07/29/22  8:35 AM  Result Value Ref Range   Sodium 140 135 - 145 mmol/L   Potassium 3.3 (L) 3.5 - 5.1 mmol/L   Chloride 119 (H) 98 - 111 mmol/L   CO2 12 (L) 22 - 32 mmol/L   Glucose, Bld 94 70 - 99 mg/dL    Comment: Glucose reference range applies only to samples taken after fasting for at least 8 hours.   BUN 11 8 - 23 mg/dL   Creatinine, Ser 6.04 (H) 0.61 - 1.24 mg/dL   Calcium 6.9 (L) 8.9 - 10.3 mg/dL   GFR, Estimated 37 (L) >60 mL/min    Comment: (NOTE) Calculated using the CKD-EPI Creatinine Equation (2021)    Anion gap 9 5 - 15    Comment: Performed at [facility], [address], [place], [place] [address]  Magnesium      Status: Abnormal   Collection Time: 07/29/22  8:35 AM  Result Value Ref Range   Magnesium 1.4 (L) 1.7 - 2.4 mg/dL    Comment: Performed at [facility], [address], [place], [place] [address]  Blood gas, venous     Status: Abnormal   Collection Time: 07/29/22  8:52 AM  Result Value Ref Range   pH, Ven 7.19 (LL) 7.25 - 7.43    Comment: CRITICAL RESULT CALLED TO, READ BACK BY AND VERIFIED WITH: TEETER,Z. RN AT 7829 07/29/22 MULLINS,T    pCO2, Ven 32 (L) 44 - 60 mmHg   pO2, Ven <31 (LL) 32 - 45 mmHg    Comment: CRITICAL RESULT CALLED TO, READ BACK BY AND VERIFIED WITH: TEETER,Z. RN AT 5621 07/29/22 MULLINS,T    Bicarbonate 12.2 (L) 20.0 - 28.0 mmol/L   Acid-base deficit 14.8 (H) 0.0 - 2.0 mmol/L   O2 Saturation 38.4 %   Patient temperature 37.0     Comment: Performed at [facility], [address], [place], [place] [address]  Lactic acid, plasma     Status: Abnormal   Collection Time: 07/29/22  9:18 AM  Result Value Ref Range   Lactic Acid, Venous 2.1 (HH) 0.5 - 1.9 mmol/L    Comment: CRITICAL RESULT CALLED TO, READ BACK BY AND VERIFIED WITH TEETERS Z. RN @1002  07/29/22 MCLEAN K. Performed at [facility], [address] [place], [place] [address]    DG Abd 1 View  Result Date: 07/29/2022 CLINICAL DATA:  NG tube placement EXAM: ABDOMEN - 1 VIEW COMPARISON:  Same day CT abdomen/pelvis FINDINGS: The enteric catheter tip and sidehole project over the expected location of the stomach. There is marked gaseous distention of the large bowel as seen on same-day CT. There is no definite free intraperitoneal air, within the confines of supine technique. IMPRESSION: Enteric catheter tip and sidehole project over the stomach. Electronically Signed   By: Lesia Hausen M.D.   On: 07/29/2022 10:34   DG Chest 2 View  Result Date: 07/29/2022 CLINICAL DATA:  shortness of breath, wheezing EXAM: CHEST - 2 VIEW COMPARISON:  PET-CT March 21,  24. FINDINGS: The heart size and mediastinal contours are within normal limits.  Both lungs are clear. No visible pleural effusions or pneumothorax. The visualized skeletal structures are unremarkable. Gaseous distension of visualized bowel loops in the upper abdomen. Recommend dedicated KUB to further evaluate. IMPRESSION: 1. No active cardiopulmonary disease. 2. Gaseous distension of visualized bowel loops in the upper abdomen. Recommend dedicated KUB to further evaluate. Electronically Signed   By: Feliberto Harts M.D.   On: 07/29/2022 08:56    Pending Labs Unresulted Labs (From admission, onward)     Start     Ordered   07/29/22 0926  Urinalysis, Routine w reflex microscopic -Urine, Clean Catch  Once,   URGENT       Question:  Specimen Source  Answer:  Urine, Clean Catch   07/29/22 0926   07/29/22 0914  Blood culture (routine x 2)  BLOOD CULTURE X 2,   R      07/29/22 0913   07/29/22 0914  Lactic acid, plasma  Now then every 2 hours,   R      07/29/22 0913            Vitals/Pain Today's Vitals   07/29/22 0822 07/29/22 0903 07/29/22 0942 07/29/22 1145  BP:    114/61  Pulse:  88  79  Resp:  (!) 22  (!) 22  Temp:      TempSrc:      SpO2:  100%  100%  Weight: 90.3 kg     Height: 5\' 9"  (1.753 m)     PainSc: 10-Worst pain ever  10-Worst pain ever     Isolation Precautions No active isolations  Medications Medications  vancomycin (VANCOREADY) IVPB 2000 mg/400 mL (2,000 mg Intravenous New Bag/Given 07/29/22 1044)  magnesium sulfate IVPB 2 g 50 mL (2 g Intravenous New Bag/Given 07/29/22 1112)  heparin ADULT infusion 100 units/mL (25000 units/232mL) (has no administration in time range)  heparin bolus via infusion 2,500 Units (has no administration in time range)  magnesium sulfate IVPB 2 g 50 mL (has no administration in time range)  potassium chloride 10 mEq in 100 mL IVPB (has no administration in time range)  calcium gluconate 1 g/ 50 mL sodium chloride IVPB (has no  administration in time range)  ipratropium-albuterol (DUONEB) 0.5-2.5 (3) MG/3ML nebulizer solution 3 mL (3 mLs Nebulization Given 07/29/22 0900)  dexamethasone (DECADRON) injection 10 mg (10 mg Intravenous Given 07/29/22 0900)  fentaNYL (SUBLIMAZE) injection 25 mcg (25 mcg Intravenous Given 07/29/22 0914)  piperacillin-tazobactam (ZOSYN) IVPB 3.375 g (0 g Intravenous Stopped 07/29/22 1038)  iohexol (OMNIPAQUE) 350 MG/ML injection 75 mL (100 mLs Intravenous Contrast Given 07/29/22 0949)    Mobility walks     Focused Assessments    R Recommendations: See Admitting Provider Note  Report given to:   Additional Notes:

## 2022-07-29 NOTE — Progress Notes (Signed)
Pharmacy Antibiotic Note  Frank Hernandez is a 82 y.o. male admitted on 07/29/2022 with cellulitis.  Pharmacy has been consulted for cefepime & vancomycin dosing.  Plan: Vancomycin 2 gm IV x 1 then 1500 mg IV q48hr for eAUC 541.5 using SCr 1.83, Vd 0.5 (BMI 29.3) Cefepime  2 mg IV q12  Flagyl 500 IV q12 per MD  Height: 5\' 9"  (175.3 cm) Weight: 90.3 kg (199 lb) IBW/kg (Calculated) : 70.7  Temp (24hrs), Avg:98.5 F (36.9 C), Min:98.3 F (36.8 C), Max:98.6 F (37 C)  Recent Labs  Lab 07/29/22 0835 07/29/22 0918  WBC 6.0  --   CREATININE 1.83*  --   LATICACIDVEN  --  2.1*    Estimated Creatinine Clearance: 35.2 mL/min (A) (by C-G formula based on SCr of 1.83 mg/dL (H)).    No Known Allergies  Antimicrobials this admission: 5/30 Vanc>> 5/30 Cefepime>> 5/30 zosyn x 1  5/30 Flagyl>>  Dose adjustments this admission:  Microbiology results: 5/30 BCx2:    Thank you for allowing pharmacy to be a part of this patient's care.  Herby Abraham, Pharm.D Use secure chat for questions 07/29/2022 12:50 PM

## 2022-07-29 NOTE — Consult Note (Signed)
Frank Hernandez 21-Feb-1941  [id number].    Requesting MD: Elpidio Anis, MD Chief Complaint/Reason for Consult: bowel obstruction, RIH   HPI:  Frank Hernandez is an 82 y/o M who presented to the ED with SOB. Patient currently on chemotherapy for multiple myeloma, also has CKD, HTN, pre-diabetes. Patient tells me that he has had abdominal pain since April bit it acutely worsened  2-3 days ago. He states he is still passing gas and having small caliber loose stools. Wife reported decreased PO intake. Denied vomiting. Denied fever, chills, currently on antibiotics for cellulitis and wound of RLE. He does note increased BLE edema and generalized fatigue and weakness. Due to persistent symptoms family called EMS. Workup in the ED showed bowel obstruction, bilateral inguinal hernias, and new subsegmental PE. No prior abdominal surgery. Not on blood thinners, just baby ASA. Denies tobacco or alcohol use. Currently gets [facility] wound care for RLE wound.   ROS: Review of Systems  All other systems reviewed and are negative.   Family History  Problem Relation Age of Onset   Colon cancer Neg Hx     Past Medical History:  Diagnosis Date   Bleeding from the nose    Hypertension    Pre-diabetes     Past Surgical History:  Procedure Laterality Date   COLONOSCOPY     INTRAMEDULLARY (IM) NAIL INTERTROCHANTERIC Left 06/11/2020   Procedure: INTRAMEDULLARY (IM) NAIL INTERTROCHANTRIC;  Surgeon: Roby Lofts, MD;  Location: [facility] OR;  Service: Orthopedics;  Laterality: Left;   INTRAMEDULLARY (IM) NAIL INTERTROCHANTERIC Right 04/17/2022   Procedure: INTRAMEDULLARY (IM) NAIL INTERTROCHANTERIC;  Surgeon: London Sheer, MD;  Location: [facility] ORS;  Service: Orthopedics;  Laterality: Right;   KNEE ARTHROSCOPY Bilateral    POLYPECTOMY      Social History:  reports that he has never smoked. He has never used smokeless tobacco. He reports that he does not drink alcohol and does not use drugs.  Allergies: No  Known Allergies  (Not in a hospital admission)    Physical Exam: Blood pressure (!) 146/74, pulse 88, temperature 98.6 F (37 C), temperature source Oral, resp. rate (!) 22, height 5\' 9"  (1.753 m), weight 90.3 kg, SpO2 100 %. General: ill appearing male on BiPap HEENT: head -normocephalic, atraumatic; Eyes: PERRLA, no conjunctival injection; anicteric sclerae  Neck- Trachea is midline, no thyromegaly or JVD appreciated.  CV- RRR, normal S1/S2, no M/R/G, BLE edema with chronic skin changes  Pulm- breathing is slightly labored on bipap non-labored Abd- soft, non-tender, mild distention, bilateral inguinal hernias - LIH is reducible but immediately pops back out, RIH I was unable to reduce.  GU- deferred  MSK- UE/LE symmetrical,  Neuro- nonfocal exam  Psych- Alert and Oriented x3 with appropriate affect Skin: warm and dry, no rashes or lesions   Results for orders placed or performed during the hospital encounter of 07/29/22 (from the past 48 hour(s))  SARS Coronavirus 2 by RT PCR (hospital order, performed in [facility] hospital lab) *cepheid single result test* Anterior Nasal Swab     Status: None   Collection Time: 07/29/22  8:26 AM   Specimen: Anterior Nasal Swab  Result Value Ref Range   SARS Coronavirus 2 by RT PCR NEGATIVE NEGATIVE    Comment: (NOTE) SARS-CoV-2 target nucleic acids are NOT DETECTED.  The SARS-CoV-2 RNA is generally detectable in upper and lower respiratory specimens during the acute phase of infection. The lowest concentration of SARS-CoV-2 viral copies this assay can detect is 250  copies / mL. A negative result does not preclude SARS-CoV-2 infection and should not be used as the sole basis for treatment or other patient management decisions.  A negative result may occur with improper specimen collection / handling, submission of specimen other than nasopharyngeal swab, presence of viral mutation(s) within the areas targeted by this assay, and inadequate  number of viral copies (<250 copies / mL). A negative result must be combined with clinical observations, patient history, and epidemiological information.  Fact Sheet for Patients:   RoadLapTop.co.za  Fact Sheet for Healthcare Providers: http://kim-miller.com/  This test is not yet approved or  cleared by the [place] FDA and has been authorized for detection and/or diagnosis of SARS-CoV-2 by FDA under an Emergency Use Authorization (EUA).  This EUA will remain in effect (meaning this test can be used) for the duration of the COVID-19 declaration under Section 564(b)(1) of the Act, 21 U.S.C. section 360bbb-3(b)(1), unless the authorization is terminated or revoked sooner.  Performed at [facility], [address], [place], [place] [address]   Brain natriuretic peptide     Status: Abnormal   Collection Time: 07/29/22  8:35 AM  Result Value Ref Range   B Natriuretic Peptide 145.9 (H) 0.0 - 100.0 pg/mL    Comment: Performed at [facility], [address], [place], [place] [address]  CBC with Differential     Status: Abnormal   Collection Time: 07/29/22  8:35 AM  Result Value Ref Range   WBC 6.0 4.0 - 10.5 K/uL   RBC 3.10 (L) 4.22 - 5.81 MIL/uL   Hemoglobin 9.9 (L) 13.0 - 17.0 g/dL   HCT 09.8 (L) 11.9 - 14.7 %   MCV 96.1 80.0 - 100.0 fL   MCH 31.9 26.0 - 34.0 pg   MCHC 33.2 30.0 - 36.0 g/dL   RDW 82.9 (H) 56.2 - 13.0 %   Platelets 252 150 - 400 K/uL   nRBC 0.0 0.0 - 0.2 %   Neutrophils Relative % 46 %   Neutro Abs 2.8 1.7 - 7.7 K/uL   Lymphocytes Relative 33 %   Lymphs Abs 2.0 0.7 - 4.0 K/uL   Monocytes Relative 17 %   Monocytes Absolute 1.0 0.1 - 1.0 K/uL   Eosinophils Relative 4 %   Eosinophils Absolute 0.2 0.0 - 0.5 K/uL   Basophils Relative 0 %   Basophils Absolute 0.0 0.0 - 0.1 K/uL   Immature Granulocytes 0 %   Abs Immature Granulocytes 0.01 0.00 - 0.07 K/uL    Comment:  Performed at [facility], [address], [place], [place] [address]  Troponin I (High Sensitivity)     Status: Abnormal   Collection Time: 07/29/22  8:35 AM  Result Value Ref Range   Troponin I (High Sensitivity) 36 (H) <18 ng/L    Comment: (NOTE) Elevated high sensitivity troponin I (hsTnI) values and significant  changes across serial measurements may suggest ACS but many other  chronic and acute conditions are known to elevate hsTnI results.  Refer to the "Links" section for chest pain algorithms and additional  guidance. Performed at [facility], [address], [place], [place] [address]   Basic metabolic panel     Status: Abnormal   Collection Time: 07/29/22  8:35 AM  Result Value Ref Range   Sodium 140 135 - 145 mmol/L   Potassium 3.3 (L) 3.5 - 5.1 mmol/L   Chloride 119 (H) 98 - 111 mmol/L  CO2 12 (L) 22 - 32 mmol/L   Glucose, Bld 94 70 - 99 mg/dL    Comment: Glucose reference range applies only to samples taken after fasting for at least 8 hours.   BUN 11 8 - 23 mg/dL   Creatinine, Ser 0.98 (H) 0.61 - 1.24 mg/dL   Calcium 6.9 (L) 8.9 - 10.3 mg/dL   GFR, Estimated 37 (L) >60 mL/min    Comment: (NOTE) Calculated using the CKD-EPI Creatinine Equation (2021)    Anion gap 9 5 - 15    Comment: Performed at [facility], [address] [place], [place] [address]  Magnesium     Status: Abnormal   Collection Time: 07/29/22  8:35 AM  Result Value Ref Range   Magnesium 1.4 (L) 1.7 - 2.4 mg/dL    Comment: Performed at [facility], [address] [place], [place] [address]  Blood gas, venous     Status: Abnormal   Collection Time: 07/29/22  8:52 AM  Result Value Ref Range   pH, Ven 7.19 (LL) 7.25 - 7.43    Comment: CRITICAL RESULT CALLED TO, READ BACK BY AND VERIFIED WITH: TEETER,Z. RN AT 6213 07/29/22 MULLINS,T    pCO2, Ven 32 (L) 44 - 60 mmHg   pO2, Ven <31 (LL) 32 - 45 mmHg    Comment:  CRITICAL RESULT CALLED TO, READ BACK BY AND VERIFIED WITH: TEETER,Z. RN AT 0865 07/29/22 MULLINS,T    Bicarbonate 12.2 (L) 20.0 - 28.0 mmol/L   Acid-base deficit 14.8 (H) 0.0 - 2.0 mmol/L   O2 Saturation 38.4 %   Patient temperature 37.0     Comment: Performed at [facility], [address] [place], [place] [address]  Lactic acid, plasma     Status: Abnormal   Collection Time: 07/29/22  9:18 AM  Result Value Ref Range   Lactic Acid, Venous 2.1 (HH) 0.5 - 1.9 mmol/L    Comment: CRITICAL RESULT CALLED TO, READ BACK BY AND VERIFIED WITH TEETERS Z. RN @1002  07/29/22 MCLEAN K. Performed at [facility], [address] [place], [place] [address]    DG Abd 1 View  Result Date: 07/29/2022 CLINICAL DATA:  NG tube placement EXAM: ABDOMEN - 1 VIEW COMPARISON:  Same day CT abdomen/pelvis FINDINGS: The enteric catheter tip and sidehole project over the expected location of the stomach. There is marked gaseous distention of the large bowel as seen on same-day CT. There is no definite free intraperitoneal air, within the confines of supine technique. IMPRESSION: Enteric catheter tip and sidehole project over the stomach. Electronically Signed   By: Lesia Hausen M.D.   On: 07/29/2022 10:34   DG Chest 2 View  Result Date: 07/29/2022 CLINICAL DATA:  shortness of breath, wheezing EXAM: CHEST - 2 VIEW COMPARISON:  PET-CT March 21, 24. FINDINGS: The heart size and mediastinal contours are within normal limits. Both lungs are clear. No visible pleural effusions or pneumothorax. The visualized skeletal structures are unremarkable. Gaseous distension of visualized bowel loops in the upper abdomen. Recommend dedicated KUB to further evaluate. IMPRESSION: 1. No active cardiopulmonary disease. 2. Gaseous distension of visualized bowel loops in the upper abdomen. Recommend dedicated KUB to further evaluate. Electronically Signed   By: Feliberto Harts M.D.   On: 07/29/2022 08:56       Assessment/Plan 82 y/o male with MMP including but not limited to 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  He has bilateral inguinal hernias that were both present on a previous CT scan. On todays CT he has signs of large bowel obstruction with massive dilation of the colon, it appears to me that the transition zone may be within the left inguinal hernia, which I was able to reduce but it did immediately recur. Will review CT with radiology and my attending to confirm that the transition does appear to be at the left inguinal hernia. Agree with NGT decompression. No signs of strangulated bowel and no emergent need for surgery today. May end up needing surgical fixation this admission. [facility]CS will follow closely.   I reviewed nursing notes, ED provider notes, hospitalist notes, last 24 h vitals and pain scores, last 48 h intake and output, last 24 h labs and trends, and last 24 h imaging results.  Adam Phenix, [facility] 07/29/2022, 10:51 AM Please see Amion for pager number during day hours 7:00am-4:30pm or 7:00am -11:30am on weekends

## 2022-07-29 NOTE — Progress Notes (Addendum)
A consult was received from an ED physician for zosyn and vancomycin per pharmacy dosing.  The patient's profile has been reviewed for ht/wt/allergies/indication/available labs.    A one time order has been placed for zosyn 3.375 gm IV x1 over 30 min and vancomycin 2000 mg IV x1.  Further antibiotics/pharmacy consults should be ordered by admitting physician if indicated.                       Thank you, Lucia Gaskins 07/29/2022  9:17 AM

## 2022-07-30 ENCOUNTER — Inpatient Hospital Stay (HOSPITAL_COMMUNITY): Payer: Medicare PPO

## 2022-07-30 ENCOUNTER — Encounter: Payer: Medicare PPO | Admitting: Dietician

## 2022-07-30 ENCOUNTER — Encounter: Payer: Self-pay | Admitting: Hematology

## 2022-07-30 ENCOUNTER — Other Ambulatory Visit: Payer: Medicare PPO

## 2022-07-30 ENCOUNTER — Ambulatory Visit: Payer: Medicare PPO

## 2022-07-30 ENCOUNTER — Other Ambulatory Visit (HOSPITAL_COMMUNITY): Payer: Self-pay

## 2022-07-30 ENCOUNTER — Other Ambulatory Visit: Payer: Self-pay

## 2022-07-30 DIAGNOSIS — R609 Edema, unspecified: Secondary | ICD-10-CM | POA: Diagnosis not present

## 2022-07-30 DIAGNOSIS — K56609 Unspecified intestinal obstruction, unspecified as to partial versus complete obstruction: Secondary | ICD-10-CM | POA: Diagnosis not present

## 2022-07-30 DIAGNOSIS — I2693 Single subsegmental pulmonary embolism without acute cor pulmonale: Secondary | ICD-10-CM | POA: Diagnosis not present

## 2022-07-30 LAB — GLUCOSE, CAPILLARY
Glucose-Capillary: 107 mg/dL — ABNORMAL HIGH (ref 70–99)
Glucose-Capillary: 83 mg/dL (ref 70–99)
Glucose-Capillary: 91 mg/dL (ref 70–99)
Glucose-Capillary: 98 mg/dL (ref 70–99)

## 2022-07-30 LAB — CULTURE, BLOOD (ROUTINE X 2): Culture: NO GROWTH

## 2022-07-30 LAB — ECHOCARDIOGRAM COMPLETE
AR max vel: 2.53 cm2
AV Area VTI: 2.35 cm2
AV Area mean vel: 2.59 cm2
AV Mean grad: 6 mmHg
AV Peak grad: 10.8 mmHg
Ao pk vel: 1.64 m/s
Height: 69 in
P 1/2 time: 434 msec
S' Lateral: 2.6 cm
Weight: 3184 oz

## 2022-07-30 LAB — CBC
HCT: 27.9 % — ABNORMAL LOW (ref 39.0–52.0)
Hemoglobin: 9 g/dL — ABNORMAL LOW (ref 13.0–17.0)
MCH: 32 pg (ref 26.0–34.0)
MCHC: 32.3 g/dL (ref 30.0–36.0)
MCV: 99.3 fL (ref 80.0–100.0)
Platelets: 230 10*3/uL (ref 150–400)
RBC: 2.81 MIL/uL — ABNORMAL LOW (ref 4.22–5.81)
RDW: 18.7 % — ABNORMAL HIGH (ref 11.5–15.5)
WBC: 6.3 10*3/uL (ref 4.0–10.5)
nRBC: 0 % (ref 0.0–0.2)

## 2022-07-30 LAB — BLOOD GAS, VENOUS
Acid-base deficit: 11 mmol/L — ABNORMAL HIGH (ref 0.0–2.0)
Bicarbonate: 13.4 mmol/L — ABNORMAL LOW (ref 20.0–28.0)
O2 Saturation: 80.1 %
Patient temperature: 36.7
pCO2, Ven: 26 mmHg — ABNORMAL LOW (ref 44–60)
pH, Ven: 7.32 (ref 7.25–7.43)
pO2, Ven: 42 mmHg (ref 32–45)

## 2022-07-30 LAB — C DIFFICILE QUICK SCREEN W PCR REFLEX
C Diff antigen: NEGATIVE
C Diff interpretation: NOT DETECTED
C Diff toxin: NEGATIVE

## 2022-07-30 LAB — BASIC METABOLIC PANEL
Anion gap: 10 (ref 5–15)
BUN: 13 mg/dL (ref 8–23)
CO2: 9 mmol/L — ABNORMAL LOW (ref 22–32)
Calcium: 7.1 mg/dL — ABNORMAL LOW (ref 8.9–10.3)
Chloride: 122 mmol/L — ABNORMAL HIGH (ref 98–111)
Creatinine, Ser: 1.39 mg/dL — ABNORMAL HIGH (ref 0.61–1.24)
GFR, Estimated: 51 mL/min — ABNORMAL LOW (ref >60)
Glucose, Bld: 129 mg/dL — ABNORMAL HIGH (ref 70–99)
Potassium: 4 mmol/L (ref 3.5–5.1)
Sodium: 141 mmol/L (ref 135–145)

## 2022-07-30 LAB — COMPREHENSIVE METABOLIC PANEL
ALT: 12 U/L (ref 0–44)
AST: 24 U/L (ref 15–41)
Albumin: 3 g/dL — ABNORMAL LOW (ref 3.5–5.0)
Alkaline Phosphatase: 85 U/L (ref 38–126)
Anion gap: 10 (ref 5–15)
BUN: 13 mg/dL (ref 8–23)
CO2: 9 mmol/L — ABNORMAL LOW (ref 22–32)
Calcium: 6.7 mg/dL — ABNORMAL LOW (ref 8.9–10.3)
Chloride: 122 mmol/L — ABNORMAL HIGH (ref 98–111)
Creatinine, Ser: 1.54 mg/dL — ABNORMAL HIGH (ref 0.61–1.24)
GFR, Estimated: 45 mL/min — ABNORMAL LOW (ref >60)
Glucose, Bld: 99 mg/dL (ref 70–99)
Potassium: 3.5 mmol/L (ref 3.5–5.1)
Sodium: 141 mmol/L (ref 135–145)
Total Bilirubin: 1 mg/dL (ref 0.3–1.2)
Total Protein: 5.5 g/dL — ABNORMAL LOW (ref 6.5–8.1)

## 2022-07-30 LAB — HEPARIN LEVEL (UNFRACTIONATED): Heparin Unfractionated: >1.1 IU/mL — ABNORMAL HIGH (ref 0.30–0.70)

## 2022-07-30 LAB — BRAIN NATRIURETIC PEPTIDE: B Natriuretic Peptide: 357.6 pg/mL — ABNORMAL HIGH (ref 0.0–100.0)

## 2022-07-30 LAB — MAGNESIUM: Magnesium: 2 mg/dL (ref 1.7–2.4)

## 2022-07-30 MED ORDER — POTASSIUM CHLORIDE 10 MEQ/100ML IV SOLN: 10.0000 meq | INTRAVENOUS | Status: DC

## 2022-07-30 MED ORDER — CALCIUM GLUCONATE-NACL 2-0.675 GM/100ML-% IV SOLN
2.0000 g | Freq: Once | INTRAVENOUS | Status: AC
  Administered 2022-07-30: 2000 mg via INTRAVENOUS
  Filled 2022-07-30: qty 100

## 2022-07-30 MED ORDER — IPRATROPIUM-ALBUTEROL 0.5-2.5 (3) MG/3ML IN SOLN
3.0000 mL | Freq: Three times a day (TID) | RESPIRATORY_TRACT | Status: DC
  Administered 2022-07-30: 3 mL via RESPIRATORY_TRACT
  Filled 2022-07-30: qty 3

## 2022-07-30 MED ORDER — POTASSIUM CHLORIDE 10 MEQ/100ML IV SOLN
10.0000 meq | INTRAVENOUS | Status: AC
  Administered 2022-07-30 (×4): 10 meq via INTRAVENOUS
  Filled 2022-07-30 (×4): qty 100

## 2022-07-30 MED ORDER — SODIUM CHLORIDE 0.9 % IV SOLN
100.0000 mg | Freq: Two times a day (BID) | INTRAVENOUS | Status: DC
  Administered 2022-07-30 – 2022-07-31 (×3): 100 mg via INTRAVENOUS
  Filled 2022-07-30 (×3): qty 100

## 2022-07-30 MED ORDER — SODIUM CHLORIDE 0.9 % IV BOLUS
500.0000 mL | Freq: Once | INTRAVENOUS | Status: AC
  Administered 2022-07-30: 500 mL via INTRAVENOUS

## 2022-07-30 MED ORDER — IPRATROPIUM-ALBUTEROL 0.5-2.5 (3) MG/3ML IN SOLN
3.0000 mL | Freq: Two times a day (BID) | RESPIRATORY_TRACT | Status: DC
  Administered 2022-07-30 – 2022-07-31 (×2): 3 mL via RESPIRATORY_TRACT
  Filled 2022-07-30 (×2): qty 3

## 2022-07-30 MED ORDER — CEFAZOLIN SODIUM-DEXTROSE 2-4 GM/100ML-% IV SOLN
2.0000 g | Freq: Three times a day (TID) | INTRAVENOUS | Status: DC
  Administered 2022-07-30 – 2022-08-01 (×6): 2 g via INTRAVENOUS
  Filled 2022-07-30 (×6): qty 100

## 2022-07-30 MED ORDER — SODIUM BICARBONATE 650 MG PO TABS
650.0000 mg | ORAL_TABLET | Freq: Two times a day (BID) | ORAL | Status: DC
  Administered 2022-07-30: 650 mg via ORAL
  Filled 2022-07-30: qty 1

## 2022-07-30 MED ORDER — ACYCLOVIR 400 MG PO TABS
400.0000 mg | ORAL_TABLET | Freq: Every day | ORAL | Status: DC
  Administered 2022-07-30 – 2022-08-15 (×17): 400 mg via ORAL
  Filled 2022-07-30 (×18): qty 1

## 2022-07-30 MED ORDER — PERFLUTREN LIPID MICROSPHERE
1.0000 mL | INTRAVENOUS | Status: AC | PRN
  Administered 2022-07-30: 3 mL via INTRAVENOUS

## 2022-07-30 MED ORDER — ENOXAPARIN SODIUM 100 MG/ML IJ SOSY
1.0000 mg/kg | PREFILLED_SYRINGE | Freq: Two times a day (BID) | INTRAMUSCULAR | Status: DC
  Administered 2022-07-30 – 2022-08-07 (×18): 90 mg via SUBCUTANEOUS
  Filled 2022-07-30 (×19): qty 1

## 2022-07-30 MED ORDER — SODIUM BICARBONATE 8.4 % IV SOLN
INTRAVENOUS | Status: DC
  Filled 2022-07-30: qty 150
  Filled 2022-07-30: qty 1000

## 2022-07-30 MED ORDER — CHLORHEXIDINE GLUCONATE CLOTH 2 % EX PADS
6.0000 | MEDICATED_PAD | Freq: Every day | CUTANEOUS | Status: DC
  Administered 2022-07-30 – 2022-08-08 (×9): 6 via TOPICAL

## 2022-07-30 MED ORDER — MEDIHONEY WOUND/BURN DRESSING EX PSTE
1.0000 | PASTE | Freq: Every day | CUTANEOUS | Status: DC
  Administered 2022-07-30 – 2022-08-16 (×18): 1 via TOPICAL
  Filled 2022-07-30 (×2): qty 44

## 2022-07-30 NOTE — Progress Notes (Signed)
PROGRESS NOTE    Frank Hernandez  ZOX:[id number] DOB: Sep 21, 1940 DOA: 07/29/2022 PCP: [name] (Inactive)   Brief Narrative: 82 year old with past medical history significant for epistaxis, hypertension, prediabetes, stage IIIa CKD, pathological fracture of the right femoral, multiple myeloma who presents to the ED due to wheezing and rhonchi.  EMS gave albuterol and routes.  Patient has been taking antibiotics for cellulitis.  He has had worsening of his right foot wound.  He reports abdominal distention, he report small watery stool.  Evaluation in the ED chest x-ray no active cardiopulmonary disease.  CTA chest showed left lower lobe segmental pulmonary PE with overall minimal clot burden.  CT abdomen with dilated colon with air-fluid level seen and scattered colonic diverticula.  Transition seen in the area of the new left inguinal hernia involving the sigmoid colon.  No pneumatosis.  Small bowel and stomach are known dilated but there is an enteric tube.  Separate large known right inguinal hernia involving the small bowel and significant portion of the urinary bladder.  Lytic bone lesion consistent with known history of myeloma.  Patient admitted with PE and a small bowel obstruction.  Surgery has been consulted.   Assessment & Plan:   Principal Problem:   Bowel obstruction (HCC) Active Problems:   Essential hypertension   Pre-diabetes   Hypokalemia   Chronic kidney disease, stage 3a (HCC)   Normocytic anemia   Multiple myeloma not having achieved remission (HCC)   Hypomagnesemia   Prolonged QT interval   Cellulitis of both lower extremities   Acute pulmonary embolism (HCC)   1-Acute Pulmonary Embolism: CTA Chest: Left lower lobe segmental pulmonary embolism. Overall minimal clot burden. He was started on heparin gtt. Plan to transition to Lovenox.  Follow ECHO.   2-SBO: -Presents with obstructive symptoms, CT abdomen pelvis showed Dilated colon with air-fluid  level seen and scattered colonic diverticula. Transition seen in the area of the new left inguinal hernia involving the sigmoid colon. Please correlate for reduce ability. No pneumatosis, free air or fluid. Small bowel and stomach are nondilated but there is an enteric tube. -NG tube  has been removed by General Surgery.  -He is having Multiples watery BM. -Started on Clear liquid diet.   3-Diarrhea; check for C diff.   4-Metabolic Acidosis: in setting of Diarrhea, dehydration.  Started IV bicarb Gtt.  IV bolus.  Check B-met this afternoon.    5-Cellulitis of both lower extremity: Change Vancomycin to Doxy due to AKI.  Change cefepime to Ancef to avoid confusion.   6-Hypomagnesemia: Replaced.   7-Hypokalemia: Will replete with 4 runs IV, now he will be on Bicarb Gtt.   Prolonged QT 2: replete electrolytes.   Hypertension: PRN Metoprolol.   Pre-Diabetes: On diet controlled.   AKI on CKD stage IIIa: Previous Cr 1.4--1.3 Presents with Cr 1.8.  Related to hypovolemia. Pre renal.  Improving with fluids.   Normocytic  anemia: anemia of malignancy MM Monitor hb.   Multiple myeloma Needs to follow up with oncology out patient.  See wound care documentation below.  Pressure Injury 03/29/22 Buttocks Right Stage 1 -  Intact skin with non-blanchable redness of a localized area usually over a bony prominence. (Active)  03/29/22 2140  Location: Buttocks  Location Orientation: Right  Staging: Stage 1 -  Intact skin with non-blanchable redness of a localized area usually over a bony prominence.  Wound Description (Comments):   Present on Admission: Yes     Pressure Injury 05/28/22 Heel Right  Stage 3 -  Full thickness tissue loss. Subcutaneous fat may be visible but bone, tendon or muscle are NOT exposed. Open wound, broken from pressure & dry skin (Active)  05/28/22 1400  Location: Heel  Location Orientation: Right  Staging: Stage 3 -  Full thickness tissue loss. Subcutaneous fat  may be visible but bone, tendon or muscle are NOT exposed.  Wound Description (Comments): Open wound, broken from pressure & dry skin  Present on Admission: Yes  Dressing Type Foam - Lift dressing to assess site every shift 07/29/22 2000                  Estimated body mass index is 29.39 kg/m as calculated from the following:   Height as of this encounter: 5\' 9"  (1.753 m).   Weight as of this encounter: 90.3 kg.   DVT prophylaxis: Lovenox Code Status: Full code Family Communication: no family at bedside.  Disposition Plan:  Status is: Inpatient Remains inpatient appropriate because: management of medical problems, AKI, metabolic acidosis. PE    Consultants:  Surgery   Procedures:  ECHO  Antimicrobials:    Subjective: He is alert, conversant. Denies abdominal pain. He has been having watery diarrhea.   Objective: Vitals:   07/30/22 0400 07/30/22 0500 07/30/22 0600 07/30/22 0700  BP: (!) 146/58 (!) 156/67 (!) 158/67 (!) 156/65  Pulse: 66 80 75 70  Resp: 15 19 (!) 26 16  Temp: 98.3 F (36.8 C)     TempSrc: Oral     SpO2: 100% 99% 100% 100%  Weight:      Height:        Intake/Output Summary (Last 24 hours) at 07/30/2022 0742 Last data filed at 07/30/2022 0700 Gross per 24 hour  Intake 1763.68 ml  Output 700 ml  Net 1063.68 ml   Filed Weights   07/29/22 0822  Weight: 90.3 kg    Examination:  General exam: Appears calm and comfortable  Respiratory system: Clear to auscultation. Respiratory effort normal. Cardiovascular system: S1 & S2 heard, RRR. Gastrointestinal system: Abdomen is nondistended, soft and nontender.  Central nervous system: Alert and oriented.  Extremities: Right heel with open wound, coin size.    Data Reviewed: I have personally reviewed following labs and imaging studies  CBC: Recent Labs  Lab 07/29/22 0835 07/30/22 0316  WBC 6.0 6.3  NEUTROABS 2.8  --   HGB 9.9* 9.0*  HCT 29.8* 27.9*  MCV 96.1 99.3  PLT 252 230    Basic Metabolic Panel: Recent Labs  Lab 07/29/22 0835 07/30/22 0316  NA 140 141  K 3.3* 3.5  CL 119* 122*  CO2 12* 9*  GLUCOSE 94 99  BUN 11 13  CREATININE 1.83* 1.54*  CALCIUM 6.9* 6.7*  MG 1.4* 2.0   GFR: Estimated Creatinine Clearance: 41.8 mL/min (A) (by C-G formula based on SCr of 1.54 mg/dL (H)). Liver Function Tests: Recent Labs  Lab 07/30/22 0316  AST 24  ALT 12  ALKPHOS 85  BILITOT 1.0  PROT 5.5*  ALBUMIN 3.0*   No results for input(s): "LIPASE", "AMYLASE" in the last 168 hours. No results for input(s): "AMMONIA" in the last 168 hours. Coagulation Profile: No results for input(s): "INR", "PROTIME" in the last 168 hours. Cardiac Enzymes: No results for input(s): "CKTOTAL", "CKMB", "CKMBINDEX", "TROPONINI" in the last 168 hours. BNP (last 3 results) No results for input(s): "PROBNP" in the last 8760 hours. HbA1C: No results for input(s): "HGBA1C" in the last 72 hours. CBG: Recent Labs  Lab 07/29/22 1756 07/29/22 2333 07/30/22 0555  GLUCAP 138* 114* 83   Lipid Profile: No results for input(s): "CHOL", "HDL", "LDLCALC", "TRIG", "CHOLHDL", "LDLDIRECT" in the last 72 hours. Thyroid Function Tests: No results for input(s): "TSH", "T4TOTAL", "FREET4", "T3FREE", "THYROIDAB" in the last 72 hours. Anemia Panel: No results for input(s): "VITAMINB12", "FOLATE", "FERRITIN", "TIBC", "IRON", "RETICCTPCT" in the last 72 hours. Sepsis Labs: Recent Labs  Lab 07/29/22 0918 07/29/22 1150  LATICACIDVEN 2.1* 1.8    Recent Results (from the past 240 hour(s))  SARS Coronavirus 2 by RT PCR (hospital order, performed in [facility] hospital lab) *cepheid single result test* Anterior Nasal Swab     Status: None   Collection Time: 07/29/22  8:26 AM   Specimen: Anterior Nasal Swab  Result Value Ref Range Status   SARS Coronavirus 2 by RT PCR NEGATIVE NEGATIVE Final    Comment: (NOTE) SARS-CoV-2 target nucleic acids are NOT DETECTED.  The SARS-CoV-2 RNA is generally  detectable in upper and lower respiratory specimens during the acute phase of infection. The lowest concentration of SARS-CoV-2 viral copies this assay can detect is 250 copies / mL. A negative result does not preclude SARS-CoV-2 infection and should not be used as the sole basis for treatment or other patient management decisions.  A negative result may occur with improper specimen collection / handling, submission of specimen other than nasopharyngeal swab, presence of viral mutation(s) within the areas targeted by this assay, and inadequate number of viral copies (<250 copies / mL). A negative result must be combined with clinical observations, patient history, and epidemiological information.  Fact Sheet for Patients:   RoadLapTop.co.za  Fact Sheet for Healthcare Providers: http://kim-miller.com/  This test is not yet approved or  cleared by the [place] FDA and has been authorized for detection and/or diagnosis of SARS-CoV-2 by FDA under an Emergency Use Authorization (EUA).  This EUA will remain in effect (meaning this test can be used) for the duration of the COVID-19 declaration under Section 564(b)(1) of the Act, 21 U.S.C. section 360bbb-3(b)(1), unless the authorization is terminated or revoked sooner.  Performed at [facility], [address] [place], [place] [address]   MRSA Next Gen by PCR, Nasal     Status: None   Collection Time: 07/29/22  3:21 PM   Specimen: Nasal Mucosa; Nasal Swab  Result Value Ref Range Status   MRSA by PCR Next Gen NOT DETECTED NOT DETECTED Final    Comment: (NOTE) The GeneXpert MRSA Assay (FDA approved for NASAL specimens only), is one component of a comprehensive MRSA colonization surveillance program. It is not intended to diagnose MRSA infection nor to guide or monitor treatment for MRSA infections. Test performance is not FDA approved in patients less than 31  years old. Performed at [facility], [address], [place], [place] [address]          Radiology Studies: DG Ankle Complete Right  Result Date: 07/29/2022 CLINICAL DATA:  Ankle wound. EXAM: RIGHT ANKLE - COMPLETE 3+ VIEW COMPARISON:  None Available. FINDINGS: There is no evidence of fracture, dislocation, or joint effusion. There is no evidence of arthropathy or other focal bone abnormality. Vascular calcifications are noted. No definite lytic destruction is noted. No definite soft tissue wound is noted. IMPRESSION: No acute abnormality. Electronically Signed   By: Lupita Raider M.D.   On: 07/29/2022 10:39   CT Angio Chest PE W and/or Wo Contrast  Result Date: 07/29/2022 CLINICAL DATA:  Dyspnea, shortness of breath. 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 EXAM: CT ANGIOGRAPHY CHEST CT ABDOMEN AND PELVIS WITH CONTRAST TECHNIQUE: Multidetector CT imaging of the chest was performed using the standard protocol during bolus administration of intravenous contrast. Multiplanar CT image reconstructions and MIPs were obtained to evaluate the vascular anatomy. Multidetector CT imaging of the abdomen and pelvis was performed using the standard protocol during bolus administration of intravenous contrast. RADIATION DOSE REDUCTION: This exam was performed according to the departmental dose-optimization program which includes automated exposure control, adjustment of the mA and/or kV according to patient size and/or use of iterative reconstruction technique. CONTRAST:  OMNIPAQUE IOHEXOL 350 MG/ML SOLN COMPARISON:  Standard CT with contrast 04/14/2022. Chest x-ray earlier 07/29/2018. PET-CT scan 05/20/2018 FINDINGS: CTA CHEST FINDINGS Cardiovascular: There is some mild breathing motion throughout the examination. Heart is nonenlarged. No pericardial effusion. Coronary artery calcifications are seen. The thoracic aorta has a overall normal caliber with some  scattered atherosclerotic plaque. There is some pulsation artifact along the ascending aorta. There is some enlargement of the main pulmonary artery. Please correlate for pulmonary artery hypertension. There is a left lower lobe pulmonary embolism along segmental vessel. No other substantial pulmonary emboli are identified. Minimal clot burden. Mediastinum/Nodes: No specific abnormal lymph node enlargement identified in the axillary regions, hilum or mediastinum. Preserved thyroid gland. Enteric tube along the esophagus with tip extending into the upper stomach. Lungs/Pleura: Breathing motion. No consolidation, pneumothorax or effusion. Mild linear opacity at the bases likely scar or atelectasis. Musculoskeletal: Curvature and degenerative changes along the spine. Review of the MIP images confirms the above findings. CT ABDOMEN and PELVIS FINDINGS Hepatobiliary: Stable small hepatic benign-appearing cystic lesions. No specific imaging follow-up. Gallbladder is contracted. Patent portal vein. Pancreas: Mild global pancreatic atrophy.  No pancreatic mass. Spleen: Normal in size without focal abnormality. Adrenals/Urinary Tract: The adrenal glands are preserved. 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 No specific imaging follow-up. No collecting system dilatation. The ureters have normal course and caliber extending down to the bladder. Bladder has preserved contour but a portion of the right side of the bladder extends into the right separate inguinal hernia. Stomach/Bowel: On this non oral contrast exam the stomach is nondilated. Small bowel is nondilated. Large bowel is dilated. Right-sided has dimensions approaching up to 10 cm in diameter. There are air-fluid levels. The transverse colon is slightly less dilated as is the descending colon with some scattered diverticula along the descending and sigmoid colon. The previous left inguinal hernia which contain only fat, now involves the sigmoid  colon and there is a transition from the dilatation to the nondilated at the level of the inguinal hernia. Please correlate with reduced stability. There is a separate large right inguinal hernia which was seen previous involving mostly the bladder but also significant small bowel and mesenteric fat. Again the small bowel is nondilated. Vascular/Lymphatic: Aortic atherosclerosis. No enlarged abdominal or pelvic lymph nodes. Reproductive: Prostate is unremarkable. Other: Anasarca.  No free intra-abdominal air or free fluid. Musculoskeletal: Streak artifact related to the patient's bilateral dynamic hip screws. Degenerative changes of the spine and pelvis. Once again there are scattered lytic lesions along the pelvis and lumbar spine as previously described. Review of the MIP images confirms the above findings. Critical Value/emergent results were called by telephone at the time of interpretation on 07/29/2022 at 7:34 am to provider Vivien Rossetti , who verbally acknowledged these results. IMPRESSION: Left lower lobe segmental pulmonary embolism. Overall minimal  clot burden. Breathing motion. Dilated colon with air-fluid level seen and scattered colonic diverticula. Transition seen in the area of the new left inguinal hernia involving the sigmoid colon. Please correlate for reduce ability. No pneumatosis, free air or fluid. Small bowel and stomach are nondilated but there is an enteric tube. Separate large known right inguinal hernia involving small bowel and significant portion of the urinary bladder. Enteric tube in the stomach. Lytic bone lesions consistent with known history of myeloma. Electronically Signed   By: Karen Kays M.D.   On: 07/29/2022 10:38   CT ABDOMEN PELVIS W CONTRAST  Result Date: 07/29/2022 CLINICAL DATA:  Dyspnea, shortness of breath. 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 EXAM: CT ANGIOGRAPHY CHEST CT ABDOMEN AND PELVIS WITH CONTRAST TECHNIQUE: Multidetector CT  imaging of the chest was performed using the standard protocol during bolus administration of intravenous contrast. Multiplanar CT image reconstructions and MIPs were obtained to evaluate the vascular anatomy. Multidetector CT imaging of the abdomen and pelvis was performed using the standard protocol during bolus administration of intravenous contrast. RADIATION DOSE REDUCTION: This exam was performed according to the departmental dose-optimization program which includes automated exposure control, adjustment of the mA and/or kV according to patient size and/or use of iterative reconstruction technique. CONTRAST:  OMNIPAQUE IOHEXOL 350 MG/ML SOLN COMPARISON:  Standard CT with contrast 04/14/2022. Chest x-ray earlier 07/29/2018. PET-CT scan 05/20/2018 FINDINGS: CTA CHEST FINDINGS Cardiovascular: There is some mild breathing motion throughout the examination. Heart is nonenlarged. No pericardial effusion. Coronary artery calcifications are seen. The thoracic aorta has a overall normal caliber with some scattered atherosclerotic plaque. There is some pulsation artifact along the ascending aorta. There is some enlargement of the main pulmonary artery. Please correlate for pulmonary artery hypertension. There is a left lower lobe pulmonary embolism along segmental vessel. No other substantial pulmonary emboli are identified. Minimal clot burden. Mediastinum/Nodes: No specific abnormal lymph node enlargement identified in the axillary regions, hilum or mediastinum. Preserved thyroid gland. Enteric tube along the esophagus with tip extending into the upper stomach. Lungs/Pleura: Breathing motion. No consolidation, pneumothorax or effusion. Mild linear opacity at the bases likely scar or atelectasis. Musculoskeletal: Curvature and degenerative changes along the spine. Review of the MIP images confirms the above findings. CT ABDOMEN and PELVIS FINDINGS Hepatobiliary: Stable small hepatic benign-appearing cystic  lesions. No specific imaging follow-up. Gallbladder is contracted. Patent portal vein. Pancreas: Mild global pancreatic atrophy.  No pancreatic mass. Spleen: Normal in size without focal abnormality. Adrenals/Urinary Tract: The adrenal glands are preserved. 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 No specific imaging follow-up. No collecting system dilatation. The ureters have normal course and caliber extending down to the bladder. Bladder has preserved contour but a portion of the right side of the bladder extends into the right separate inguinal hernia. Stomach/Bowel: On this non oral contrast exam the stomach is nondilated. Small bowel is nondilated. Large bowel is dilated. Right-sided has dimensions approaching up to 10 cm in diameter. There are air-fluid levels. The transverse colon is slightly less dilated as is the descending colon with some scattered diverticula along the descending and sigmoid colon. The previous left inguinal hernia which contain only fat, now involves the sigmoid colon and there is a transition from the dilatation to the nondilated at the level of the inguinal hernia. Please correlate with reduced stability. There is a separate large right inguinal hernia which was seen previous involving mostly the bladder but also significant small bowel and mesenteric  fat. Again the small bowel is nondilated. Vascular/Lymphatic: Aortic atherosclerosis. No enlarged abdominal or pelvic lymph nodes. Reproductive: Prostate is unremarkable. Other: Anasarca.  No free intra-abdominal air or free fluid. Musculoskeletal: Streak artifact related to the patient's bilateral dynamic hip screws. Degenerative changes of the spine and pelvis. Once again there are scattered lytic lesions along the pelvis and lumbar spine as previously described. Review of the MIP images confirms the above findings. Critical Value/emergent results were called by telephone at the time of interpretation on 07/29/2022 at  7:34 am to provider Vivien Rossetti , who verbally acknowledged these results. IMPRESSION: Left lower lobe segmental pulmonary embolism. Overall minimal clot burden. Breathing motion. Dilated colon with air-fluid level seen and scattered colonic diverticula. Transition seen in the area of the new left inguinal hernia involving the sigmoid colon. Please correlate for reduce ability. No pneumatosis, free air or fluid. Small bowel and stomach are nondilated but there is an enteric tube. Separate large known right inguinal hernia involving small bowel and significant portion of the urinary bladder. Enteric tube in the stomach. Lytic bone lesions consistent with known history of myeloma. Electronically Signed   By: Karen Kays M.D.   On: 07/29/2022 10:38   DG Abd 1 View  Result Date: 07/29/2022 CLINICAL DATA:  NG tube placement EXAM: ABDOMEN - 1 VIEW COMPARISON:  Same day CT abdomen/pelvis FINDINGS: The enteric catheter tip and sidehole project over the expected location of the stomach. There is marked gaseous distention of the large bowel as seen on same-day CT. There is no definite free intraperitoneal air, within the confines of supine technique. IMPRESSION: Enteric catheter tip and sidehole project over the stomach. Electronically Signed   By: Lesia Hausen M.D.   On: 07/29/2022 10:34   DG Chest 2 View  Result Date: 07/29/2022 CLINICAL DATA:  shortness of breath, wheezing EXAM: CHEST - 2 VIEW COMPARISON:  PET-CT March 21, 24. FINDINGS: The heart size and mediastinal contours are within normal limits. Both lungs are clear. No visible pleural effusions or pneumothorax. The visualized skeletal structures are unremarkable. Gaseous distension of visualized bowel loops in the upper abdomen. Recommend dedicated KUB to further evaluate. IMPRESSION: 1. No active cardiopulmonary disease. 2. Gaseous distension of visualized bowel loops in the upper abdomen. Recommend dedicated KUB to further evaluate. Electronically  Signed   By: Feliberto Harts M.D.   On: 07/29/2022 08:56        Scheduled Meds:  Chlorhexidine Gluconate Cloth  6 each Topical Daily   leptospermum manuka honey  1 Application Topical Daily   pantoprazole (PROTONIX) IV  40 mg Intravenous Q24H   Continuous Infusions:  calcium gluconate     ceFEPime (MAXIPIME) IV Stopped (07/30/22 7829)   heparin 1,150 Units/hr (07/30/22 0700)   metronidazole Stopped (07/29/22 2229)   potassium chloride     [START ON 07/31/2022] vancomycin       LOS: 1 day    Time spent: 35 minutes.     Alba Cory, MD [facility]   If 7PM-7AM, please contact night-coverage [email or web address]  07/30/2022, 7:42 AM

## 2022-07-30 NOTE — TOC Benefit Eligibility Note (Signed)
Patient Advocate Encounter  Insurance verification completed.    The patient is currently admitted and upon discharge could be taking Eliquis 5 mg.  The current 30 day co-pay is $47.00.   The patient is insured through [employer] Part D   This test claim was processed through [facility]- copay amounts may vary at other pharmacies due to pharmacy/plan contracts, or as the patient moves through the different stages of their insurance plan.  Benjaman Artman, CPHT Pharmacy Patient Advocate Specialist [facility] Patient Advocate Team Direct Number: [phone]  Fax: [phone]       

## 2022-07-30 NOTE — Progress Notes (Signed)
ANTICOAGULATION CONSULT NOTE - Follow Up  Pharmacy Consult for heparin > therapeutic LMWH Indication: pulmonary embolus  No Known Allergies  Patient Measurements: Height: 5\' 9"  (175.3 cm) Weight: 90.3 kg (199 lb) IBW/kg (Calculated) : 70.7 Heparin Dosing Weight: 89 kg  Vital Signs: Temp: 98.3 F (36.8 C) (05/31 0400) Temp Source: Oral (05/31 0400) BP: 156/65 (05/31 0700) Pulse Rate: 70 (05/31 0700)  Labs: Recent Labs    07/29/22 0835 07/29/22 1150 07/29/22 2049 07/30/22 0316  HGB 9.9*  --   --  9.0*  HCT 29.8*  --   --  27.9*  PLT 252  --   --  230  HEPARINUNFRC  --   --  >1.10*  --   CREATININE 1.83*  --   --  1.54*  TROPONINIHS 36* 32*  --   --     Estimated Creatinine Clearance: 41.8 mL/min (A) (by C-G formula based on SCr of 1.54 mg/dL (H)).   Medical History: Past Medical History:  Diagnosis Date   Bleeding from the nose    Hypertension    Pre-diabetes     Medications: No anticoagulants PTA  Assessment: Pt is an 42 yoM with PMH significant for CKD, multiple myeloma - currently receiving chemotherapy outpatient (last dose given 5/10). CTA positive for left lower lobe segmental PE - pharmacy consulted to dose heparin.   Today, 07/30/22 Heparin level > 1.1 remains supratherapeutic despite holding heparin infusion and decreasing rate to 1150 units/hr CBC: Hgb slightly low/decreased, Plt WNL SCr 1.54 is ~baseline No signs of bleeding - confirmed with RN.  Diet advanced to clear liquid. Multiple BM. No plan for surgical intervention at this time. D/w TRH - will transition anticoagulation from UFH to therapeutic enoxaparin.   Goal of Therapy:  Heparin level 0.3-0.7 units/ml Monitor platelets by anticoagulation protocol: Yes   Plan:  Stop heparin. Hold anticoagulation x1 hour given supratherapeutic level Initiate enoxaparin 90 mg (1 mg/kg) subQ q12h Monitor CBC, renal function.  Monitor for signs of bleeding  Follow for ability to transition to PO  anticoagulation with advancing diet.  Cindi Carbon, PharmD 07/30/2022,7:37 AM

## 2022-07-30 NOTE — Consult Note (Signed)
WOC Nurse Consult Note: Reason for Consult:Right heel wound, pressure injury Stage 3. Receives treatment for this in the community by [facility]. Patent is s/p right hip fracture, 04/17/22. Wound type:pressure Pressure Injury POA: Yes Measurement:Per Nursing Flow Sheet on 07/29/22:  3cm x 1.5cm x 0.5cm Wound ZOX:WRUEA, pink, moist Drainage (amount, consistency, odor) Scant serosanguinous Periwound:intact Dressing procedure/placement/frequency: I have provided Nursing with guidance for care of this pressure injury while in house using a daily soap and water cleanse, rinse and dry followed by an application of leptospermum Manuka honey (MediHoney). This is to be topped with dry gauze and secured with Kerlix roll gauze/paper tape. Feet are to be placed into Prevalon boots. Turning and repositioning is in place, time in the supine position is to be minimized. A sacral foam is to be placed for PI prophylaxis.  WOC nursing team will not follow, but will remain available to this patient, the nursing and medical teams.  Please re-consult if needed.  Thank you for inviting [place] to participate in this patient's Plan of Care.  Ladona Mow, MSN, RN, CNS, GNP, Leda Min, [employer], [employer] phone:  [phone]

## 2022-07-30 NOTE — Progress Notes (Signed)
Subjective/Chief Complaint: Patient awake alert.  Comfortable.  Asking for ice cream.  Multiple bowel movements.  Nothing on his NG tube.   Objective: Vital signs in last 24 hours: Temp:  [97.5 F (36.4 C)-98.3 F (36.8 C)] 97.8 F (36.6 C) (05/31 0748) Pulse Rate:  [66-88] 70 (05/31 0700) Resp:  [15-26] 16 (05/31 0700) BP: (114-172)/(52-86) 156/65 (05/31 0700) SpO2:  [96 %-100 %] 100 % (05/31 0700) FiO2 (%):  [28 %] 28 % (05/30 0903) Last BM Date : 07/29/22  Intake/Output from previous day: 05/30 0701 - 05/31 0700 In: 1763.7 [I.V.:247.5; NG/GT:30; IV Piggyback:1486.2] Out: 700 [Urine:700] Intake/Output this shift: No intake/output data recorded.  Abdomen: Less distended.  Reducible left inguinal hernia.  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  No rebound or guarding.  No abdominal breathing noted.  KUB reviewed which shows colonic distention.  Lab Results:  Recent Labs    07/29/22 0835 07/30/22 0316  WBC 6.0 6.3  HGB 9.9* 9.0*  HCT 29.8* 27.9*  PLT 252 230   BMET Recent Labs    07/29/22 0835 07/30/22 0316  NA 140 141  K 3.3* 3.5  CL 119* 122*  CO2 12* 9*  GLUCOSE 94 99  BUN 11 13  CREATININE 1.83* 1.54*  CALCIUM 6.9* 6.7*   PT/INR No results for input(s): "LABPROT", "INR" in the last 72 hours. ABG Recent Labs    07/29/22 0852  HCO3 12.2*    Studies/Results: DG Ankle Complete Right  Result Date: 07/29/2022 CLINICAL DATA:  Ankle wound. EXAM: RIGHT ANKLE - COMPLETE 3+ VIEW COMPARISON:  None Available. FINDINGS: There is no evidence of fracture, dislocation, or joint effusion. There is no evidence of arthropathy or other focal bone abnormality. Vascular calcifications are noted. No definite lytic destruction is noted. No definite soft tissue wound is noted. IMPRESSION: No acute abnormality. Electronically Signed   By: Lupita Raider M.D.   On: 07/29/2022 10:39   CT Angio Chest PE W and/or Wo Contrast  Result Date:  07/29/2022 CLINICAL DATA:  Dyspnea, shortness of breath. 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 EXAM: CT ANGIOGRAPHY CHEST CT ABDOMEN AND PELVIS WITH CONTRAST TECHNIQUE: Multidetector CT imaging of the chest was performed using the standard protocol during bolus administration of intravenous contrast. Multiplanar CT image reconstructions and MIPs were obtained to evaluate the vascular anatomy. Multidetector CT imaging of the abdomen and pelvis was performed using the standard protocol during bolus administration of intravenous contrast. RADIATION DOSE REDUCTION: This exam was performed according to the departmental dose-optimization program which includes automated exposure control, adjustment of the mA and/or kV according to patient size and/or use of iterative reconstruction technique. CONTRAST:  OMNIPAQUE IOHEXOL 350 MG/ML SOLN COMPARISON:  Standard CT with contrast 04/14/2022. Chest x-ray earlier 07/29/2018. PET-CT scan 05/20/2018 FINDINGS: CTA CHEST FINDINGS Cardiovascular: There is some mild breathing motion throughout the examination. Heart is nonenlarged. No pericardial effusion. Coronary artery calcifications are seen. The thoracic aorta has a overall normal caliber with some scattered atherosclerotic plaque. There is some pulsation artifact along the ascending aorta. There is some enlargement of the main pulmonary artery. Please correlate for pulmonary artery hypertension. There is a left lower lobe pulmonary embolism along segmental vessel. No other substantial pulmonary emboli are identified. Minimal clot burden. Mediastinum/Nodes: No specific abnormal lymph node enlargement identified in the axillary regions, hilum or mediastinum. Preserved thyroid gland. Enteric tube along the esophagus with tip extending into the upper stomach. Lungs/Pleura: Breathing motion. No consolidation, pneumothorax  or effusion. Mild linear opacity at the bases likely scar or atelectasis.  Musculoskeletal: Curvature and degenerative changes along the spine. Review of the MIP images confirms the above findings. CT ABDOMEN and PELVIS FINDINGS Hepatobiliary: Stable small hepatic benign-appearing cystic lesions. No specific imaging follow-up. Gallbladder is contracted. Patent portal vein. Pancreas: Mild global pancreatic atrophy.  No pancreatic mass. Spleen: Normal in size without focal abnormality. Adrenals/Urinary Tract: The adrenal glands are preserved. 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 No specific imaging follow-up. No collecting system dilatation. The ureters have normal course and caliber extending down to the bladder. Bladder has preserved contour but a portion of the right side of the bladder extends into the right separate inguinal hernia. Stomach/Bowel: On this non oral contrast exam the stomach is nondilated. Small bowel is nondilated. Large bowel is dilated. Right-sided has dimensions approaching up to 10 cm in diameter. There are air-fluid levels. The transverse colon is slightly less dilated as is the descending colon with some scattered diverticula along the descending and sigmoid colon. The previous left inguinal hernia which contain only fat, now involves the sigmoid colon and there is a transition from the dilatation to the nondilated at the level of the inguinal hernia. Please correlate with reduced stability. There is a separate large right inguinal hernia which was seen previous involving mostly the bladder but also significant small bowel and mesenteric fat. Again the small bowel is nondilated. Vascular/Lymphatic: Aortic atherosclerosis. No enlarged abdominal or pelvic lymph nodes. Reproductive: Prostate is unremarkable. Other: Anasarca.  No free intra-abdominal air or free fluid. Musculoskeletal: Streak artifact related to the patient's bilateral dynamic hip screws. Degenerative changes of the spine and pelvis. Once again there are scattered lytic lesions  along the pelvis and lumbar spine as previously described. Review of the MIP images confirms the above findings. Critical Value/emergent results were called by telephone at the time of interpretation on 07/29/2022 at 7:34 am to provider Vivien Rossetti , who verbally acknowledged these results. IMPRESSION: Left lower lobe segmental pulmonary embolism. Overall minimal clot burden. Breathing motion. Dilated colon with air-fluid level seen and scattered colonic diverticula. Transition seen in the area of the new left inguinal hernia involving the sigmoid colon. Please correlate for reduce ability. No pneumatosis, free air or fluid. Small bowel and stomach are nondilated but there is an enteric tube. Separate large known right inguinal hernia involving small bowel and significant portion of the urinary bladder. Enteric tube in the stomach. Lytic bone lesions consistent with known history of myeloma. Electronically Signed   By: Karen Kays M.D.   On: 07/29/2022 10:38   CT ABDOMEN PELVIS W CONTRAST  Result Date: 07/29/2022 CLINICAL DATA:  Dyspnea, shortness of breath. 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 EXAM: CT ANGIOGRAPHY CHEST CT ABDOMEN AND PELVIS WITH CONTRAST TECHNIQUE: Multidetector CT imaging of the chest was performed using the standard protocol during bolus administration of intravenous contrast. Multiplanar CT image reconstructions and MIPs were obtained to evaluate the vascular anatomy. Multidetector CT imaging of the abdomen and pelvis was performed using the standard protocol during bolus administration of intravenous contrast. RADIATION DOSE REDUCTION: This exam was performed according to the departmental dose-optimization program which includes automated exposure control, adjustment of the mA and/or kV according to patient size and/or use of iterative reconstruction technique. CONTRAST:  OMNIPAQUE IOHEXOL 350 MG/ML SOLN COMPARISON:  Standard CT with contrast 04/14/2022.  Chest x-ray earlier 07/29/2018. PET-CT scan 05/20/2018 FINDINGS: CTA CHEST FINDINGS Cardiovascular: There is some  mild breathing motion throughout the examination. Heart is nonenlarged. No pericardial effusion. Coronary artery calcifications are seen. The thoracic aorta has a overall normal caliber with some scattered atherosclerotic plaque. There is some pulsation artifact along the ascending aorta. There is some enlargement of the main pulmonary artery. Please correlate for pulmonary artery hypertension. There is a left lower lobe pulmonary embolism along segmental vessel. No other substantial pulmonary emboli are identified. Minimal clot burden. Mediastinum/Nodes: No specific abnormal lymph node enlargement identified in the axillary regions, hilum or mediastinum. Preserved thyroid gland. Enteric tube along the esophagus with tip extending into the upper stomach. Lungs/Pleura: Breathing motion. No consolidation, pneumothorax or effusion. Mild linear opacity at the bases likely scar or atelectasis. Musculoskeletal: Curvature and degenerative changes along the spine. Review of the MIP images confirms the above findings. CT ABDOMEN and PELVIS FINDINGS Hepatobiliary: Stable small hepatic benign-appearing cystic lesions. No specific imaging follow-up. Gallbladder is contracted. Patent portal vein. Pancreas: Mild global pancreatic atrophy.  No pancreatic mass. Spleen: Normal in size without focal abnormality. Adrenals/Urinary Tract: The adrenal glands are preserved. 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 No specific imaging follow-up. No collecting system dilatation. The ureters have normal course and caliber extending down to the bladder. Bladder has preserved contour but a portion of the right side of the bladder extends into the right separate inguinal hernia. Stomach/Bowel: On this non oral contrast exam the stomach is nondilated. Small bowel is nondilated. Large bowel is dilated. Right-sided  has dimensions approaching up to 10 cm in diameter. There are air-fluid levels. The transverse colon is slightly less dilated as is the descending colon with some scattered diverticula along the descending and sigmoid colon. The previous left inguinal hernia which contain only fat, now involves the sigmoid colon and there is a transition from the dilatation to the nondilated at the level of the inguinal hernia. Please correlate with reduced stability. There is a separate large right inguinal hernia which was seen previous involving mostly the bladder but also significant small bowel and mesenteric fat. Again the small bowel is nondilated. Vascular/Lymphatic: Aortic atherosclerosis. No enlarged abdominal or pelvic lymph nodes. Reproductive: Prostate is unremarkable. Other: Anasarca.  No free intra-abdominal air or free fluid. Musculoskeletal: Streak artifact related to the patient's bilateral dynamic hip screws. Degenerative changes of the spine and pelvis. Once again there are scattered lytic lesions along the pelvis and lumbar spine as previously described. Review of the MIP images confirms the above findings. Critical Value/emergent results were called by telephone at the time of interpretation on 07/29/2022 at 7:34 am to provider Vivien Rossetti , who verbally acknowledged these results. IMPRESSION: Left lower lobe segmental pulmonary embolism. Overall minimal clot burden. Breathing motion. Dilated colon with air-fluid level seen and scattered colonic diverticula. Transition seen in the area of the new left inguinal hernia involving the sigmoid colon. Please correlate for reduce ability. No pneumatosis, free air or fluid. Small bowel and stomach are nondilated but there is an enteric tube. Separate large known right inguinal hernia involving small bowel and significant portion of the urinary bladder. Enteric tube in the stomach. Lytic bone lesions consistent with known history of myeloma. Electronically Signed    By: Karen Kays M.D.   On: 07/29/2022 10:38   DG Abd 1 View  Result Date: 07/29/2022 CLINICAL DATA:  NG tube placement EXAM: ABDOMEN - 1 VIEW COMPARISON:  Same day CT abdomen/pelvis FINDINGS: The enteric catheter tip and sidehole project over the expected location of the stomach.  There is marked gaseous distention of the large bowel as seen on same-day CT. There is no definite free intraperitoneal air, within the confines of supine technique. IMPRESSION: Enteric catheter tip and sidehole project over the stomach. Electronically Signed   By: Lesia Hausen M.D.   On: 07/29/2022 10:34   DG Chest 2 View  Result Date: 07/29/2022 CLINICAL DATA:  shortness of breath, wheezing EXAM: CHEST - 2 VIEW COMPARISON:  PET-CT March 21, 24. FINDINGS: The heart size and mediastinal contours are within normal limits. Both lungs are clear. No visible pleural effusions or pneumothorax. The visualized skeletal structures are unremarkable. Gaseous distension of visualized bowel loops in the upper abdomen. Recommend dedicated KUB to further evaluate. IMPRESSION: 1. No active cardiopulmonary disease. 2. Gaseous distension of visualized bowel loops in the upper abdomen. Recommend dedicated KUB to further evaluate. Electronically Signed   By: Feliberto Harts M.D.   On: 07/29/2022 08:56    Anti-infectives: Anti-infectives (From admission, onward)    Start     Dose/Rate Route Frequency Ordered Stop   07/31/22 1000  vancomycin (VANCOREADY) IVPB 1500 mg/300 mL  Status:  Discontinued        1,500 mg 150 mL/hr over 120 Minutes Intravenous Every 48 hours 07/29/22 1243 07/30/22 0755   07/30/22 1400  ceFAZolin (ANCEF) IVPB 2g/100 mL premix        2 g 200 mL/hr over 30 Minutes Intravenous Every 8 hours 07/30/22 0843     07/30/22 0900  doxycycline (VIBRAMYCIN) 100 mg in sodium chloride 0.9 % 250 mL IVPB        100 mg 125 mL/hr over 120 Minutes Intravenous Every 12 hours 07/30/22 0809     07/29/22 1800  ceFEPIme (MAXIPIME) 2 g in  sodium chloride 0.9 % 100 mL IVPB  Status:  Discontinued        2 g 200 mL/hr over 30 Minutes Intravenous Every 12 hours 07/29/22 1234 07/30/22 0841   07/29/22 1230  metroNIDAZOLE (FLAGYL) IVPB 500 mg        500 mg 100 mL/hr over 60 Minutes Intravenous Every 12 hours 07/29/22 1205     07/29/22 0930  piperacillin-tazobactam (ZOSYN) IVPB 3.375 g        3.375 g 100 mL/hr over 30 Minutes Intravenous  Once 07/29/22 0920 07/29/22 1038   07/29/22 0930  vancomycin (VANCOREADY) IVPB 2000 mg/400 mL        2,000 mg 200 mL/hr over 120 Minutes Intravenous  Once 07/29/22 1610 07/29/22 1246       Assessment/Plan:   82 y/o male with MMP including but not limited to 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 He has bilateral inguinal hernias that were both present on a previous CT scan.   Patient clinically is not obstructed and having multiple bowel movements with less abdominal distention.  May require testing for C. difficile since she had diarrhea and has been on antibiotics and that may explain his colonic dilation as well.  He has no signs of peritonitis.  NG tube is not sucking much out.  Recommend removing it today and I talked to the ICU nurse about that  Okay to start clear liquids for today  Overall status is quite poor given metastatic cancer, and extremely poor health.  He does not require repair of his hernias at this point in time and it is unclear if this is actually causing any sort of obstruction.  These are chronic.  Okay  to anticoagulate for now.  We will follow along but do not recommend operative intervention due to extremely poor health and the fact that he is not acting obstructed at this point in time.  We will monitor closely though for any change in his condition.    LOS: 1 day    Dortha Schwalbe MD 07/30/2022 Moderate complexity

## 2022-07-30 NOTE — Progress Notes (Signed)
Bilateral lower extremity venous duplex has been completed. Preliminary results can be found in CV Proc through chart review.  Results were given to the patient's nurse, Tresa Res.  07/30/22 4:36 PM Olen Cordial RVT

## 2022-07-30 NOTE — TOC Initial Note (Signed)
Transition of Care [facility]) - Initial/Assessment Note    Patient Details  Name: Frank Hernandez MRN: [id number] Date of Birth: [birth year]  Transition of Care [facility]) CM/SW Contact:    Adrian Prows, RN Phone Number: 07/30/2022, 2:57 PM  Clinical Narrative:                 [facility] consult for d/c planning; pt confused; spoke w/ pt and wife Frank Hernandez in room; Mrs Bramson is his POC [phone]); pt is from home and they plan for him to return at d/c; she says pt does not experience IPV, housing/food insecurity, or difficulty paying utilities; pt's wife says  he has transportation but she needs help getting him into car; the pt wears glasses; he does not have dentures or HA; pt as walker, wheel chair, cane, shower chair,and grab bars in shower; she says pt had HH w/ [facility] that ended on Monday; he does not have home home oxygen; TOC will follow.   Expected Discharge Plan: Home/Self Care Barriers to Discharge: Continued Medical Work up   Patient Goals and CMS Choice Patient states their goals for this hospitalization and ongoing recovery are:: Pt will return home per pt's wife [name]          Expected Discharge Plan and Services   Discharge Planning Services: CM Consult   Living arrangements for the past 2 months: Single Family Home                                      Prior Living Arrangements/Services Living arrangements for the past 2 months: Single Family Home Lives with:: Spouse Patient language and need for interpreter reviewed:: Yes Do you feel safe going back to the place where you live?: Yes      Need for Family Participation in Patient Care: Yes (Comment) Care giver support system in place?: Yes (comment) Current home services: DME (cane, walker, wheelchair) Criminal Activity/Legal Involvement Pertinent to Current Situation/Hospitalization: No - Comment as needed  Activities of Daily Living      Permission Sought/Granted Permission  sought to share information with : Case Manager Permission granted to share information with : Yes, Verbal Permission Granted  Share Information with NAME: Burnard Bunting, RN, CM     Permission granted to share info w Relationship: Kayne Coultas (spouse) [phone]     Emotional Assessment Appearance:: Appears older than stated age Attitude/Demeanor/Rapport: Gracious Affect (typically observed): Accepting Orientation: : Oriented to Self Alcohol / Substance Use: Not Applicable Psych Involvement: No (comment)  Admission diagnosis:  Bowel obstruction (HCC) [K56.609] SBO (small bowel obstruction) (HCC) [K56.609] Dyspnea, unspecified type [R06.00] Single subsegmental pulmonary embolism without acute cor pulmonale (HCC) [I26.93] Patient Active Problem List   Diagnosis Date Noted   Hypomagnesemia 07/29/2022   Bowel obstruction (HCC) 07/29/2022   Prolonged QT interval 07/29/2022   Cellulitis of both lower extremities 07/29/2022   Acute pulmonary embolism (HCC) 07/29/2022   Glaucoma 07/29/2022   Bradyarrhythmia 06/01/2022   Sinus pause 05/29/2022   Multiple myeloma (HCC) 05/29/2022   AKI (acute kidney injury) (HCC) 05/28/2022   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   Hypotension 05/28/2022   Dehydration 05/28/2022   Diarrhea 05/28/2022   Lactic acidosis 05/28/2022   Counseling regarding advance care planning and goals of care 04/22/2022   Multiple myeloma not having achieved remission (HCC) 04/21/2022   Vitamin D deficiency 04/19/2022   Malignant neoplasm  of bone with metastases (HCC) 04/15/2022   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   Closed displaced subtrochanteric fracture of right femur (HCC) 04/13/2022   Normocytic anemia 04/13/2022   Chronic kidney disease, stage 3a (HCC) 04/03/2022   Decreased oral intake 04/02/2022   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   Essential hypertension 04/01/2022   Pain in joint, lower  leg 04/01/2022   Effusion of right knee 03/30/2022   Pressure injury of skin 03/30/2022   Effusion, right knee 03/30/2022   Concussion with unknown loss of consciousness status 12/24/2020   Hypokalemia    Rib fractures 06/10/2020   Severe essential hypertension 11/15/2018   Syncope 12/26/2015   Pre-diabetes 12/26/2015   PCP:  [name] (Inactive) Pharmacy:   [id number] - [place]), [place] - [address] [place] ([place]) [place] [address] Phone: [phone] Fax: [phone]     Social Determinants of Health (SDOH) Social History: SDOH Screenings   Food Insecurity: Food Insecurity Present (05/28/2022)  Housing: Low Risk  (05/28/2022)  Transportation Needs: Unmet Transportation Needs (05/28/2022)  Utilities: Not At Risk (05/28/2022)  Depression (PHQ2-9): Low Risk  (04/29/2022)  Tobacco Use: Low Risk  (07/29/2022)   SDOH Interventions: Food Insecurity Interventions: Intervention Not Indicated, Inpatient TOC   Readmission Risk Interventions    04/14/2022    1:48 PM 04/02/2022   11:43 AM  Readmission Risk Prevention Plan  Post Dischage Appt  Complete  Medication Screening  Complete  Transportation Screening Complete Complete  PCP or Specialist Appt within 5-7 Days Complete   Home Care Screening Complete   Medication Review (RN CM) Complete

## 2022-07-31 DIAGNOSIS — K6389 Other specified diseases of intestine: Secondary | ICD-10-CM | POA: Diagnosis not present

## 2022-07-31 DIAGNOSIS — R197 Diarrhea, unspecified: Secondary | ICD-10-CM | POA: Diagnosis not present

## 2022-07-31 DIAGNOSIS — K56609 Unspecified intestinal obstruction, unspecified as to partial versus complete obstruction: Secondary | ICD-10-CM | POA: Diagnosis not present

## 2022-07-31 DIAGNOSIS — C9000 Multiple myeloma not having achieved remission: Secondary | ICD-10-CM | POA: Diagnosis not present

## 2022-07-31 DIAGNOSIS — I2699 Other pulmonary embolism without acute cor pulmonale: Secondary | ICD-10-CM | POA: Diagnosis not present

## 2022-07-31 LAB — GASTROINTESTINAL PANEL BY PCR, STOOL (REPLACES STOOL CULTURE)

## 2022-07-31 LAB — CBC
HCT: 24 % — ABNORMAL LOW (ref 39.0–52.0)
Hemoglobin: 8.2 g/dL — ABNORMAL LOW (ref 13.0–17.0)
MCH: 32.3 pg (ref 26.0–34.0)
MCHC: 34.2 g/dL (ref 30.0–36.0)
MCV: 94.5 fL (ref 80.0–100.0)
Platelets: 212 10*3/uL (ref 150–400)
RBC: 2.54 MIL/uL — ABNORMAL LOW (ref 4.22–5.81)
RDW: 18.7 % — ABNORMAL HIGH (ref 11.5–15.5)
WBC: 5 10*3/uL (ref 4.0–10.5)
nRBC: 0 % (ref 0.0–0.2)

## 2022-07-31 LAB — COMPREHENSIVE METABOLIC PANEL
ALT: 13 U/L (ref 0–44)
AST: 24 U/L (ref 15–41)
Albumin: 2.7 g/dL — ABNORMAL LOW (ref 3.5–5.0)
Alkaline Phosphatase: 80 U/L (ref 38–126)
Anion gap: 10 (ref 5–15)
BUN: 12 mg/dL (ref 8–23)
CO2: 12 mmol/L — ABNORMAL LOW (ref 22–32)
Calcium: 6.7 mg/dL — ABNORMAL LOW (ref 8.9–10.3)
Chloride: 120 mmol/L — ABNORMAL HIGH (ref 98–111)
Creatinine, Ser: 1.29 mg/dL — ABNORMAL HIGH (ref 0.61–1.24)
GFR, Estimated: 56 mL/min — ABNORMAL LOW (ref >60)
Glucose, Bld: 89 mg/dL (ref 70–99)
Potassium: 2.4 mmol/L — CL (ref 3.5–5.1)
Sodium: 142 mmol/L (ref 135–145)
Total Bilirubin: 0.5 mg/dL (ref 0.3–1.2)
Total Protein: 4.8 g/dL — ABNORMAL LOW (ref 6.5–8.1)

## 2022-07-31 LAB — BASIC METABOLIC PANEL
Anion gap: 9 (ref 5–15)
BUN: 10 mg/dL (ref 8–23)
CO2: 13 mmol/L — ABNORMAL LOW (ref 22–32)
Calcium: 7 mg/dL — ABNORMAL LOW (ref 8.9–10.3)
Chloride: 121 mmol/L — ABNORMAL HIGH (ref 98–111)
Creatinine, Ser: 1.27 mg/dL — ABNORMAL HIGH (ref 0.61–1.24)
GFR, Estimated: 57 mL/min — ABNORMAL LOW (ref >60)
Glucose, Bld: 105 mg/dL — ABNORMAL HIGH (ref 70–99)
Potassium: 2.6 mmol/L — CL (ref 3.5–5.1)
Sodium: 143 mmol/L (ref 135–145)

## 2022-07-31 LAB — GLUCOSE, CAPILLARY
Glucose-Capillary: 91 mg/dL (ref 70–99)
Glucose-Capillary: 105 mg/dL — ABNORMAL HIGH (ref 70–99)

## 2022-07-31 LAB — CULTURE, BLOOD (ROUTINE X 2)

## 2022-07-31 LAB — MAGNESIUM: Magnesium: 1.5 mg/dL — ABNORMAL LOW (ref 1.7–2.4)

## 2022-07-31 MED ORDER — SODIUM BICARBONATE 650 MG PO TABS
1300.0000 mg | ORAL_TABLET | Freq: Three times a day (TID) | ORAL | Status: DC
  Administered 2022-07-31 – 2022-08-16 (×48): 1300 mg via ORAL
  Filled 2022-07-31 (×48): qty 2

## 2022-07-31 MED ORDER — POTASSIUM CHLORIDE CRYS ER 20 MEQ PO TBCR
40.0000 meq | EXTENDED_RELEASE_TABLET | Freq: Once | ORAL | Status: AC
  Administered 2022-07-31: 40 meq via ORAL
  Filled 2022-07-31: qty 4

## 2022-07-31 MED ORDER — MAGNESIUM SULFATE 2 GM/50ML IV SOLN
2.0000 g | Freq: Once | INTRAVENOUS | Status: AC
  Administered 2022-07-31: 2 g via INTRAVENOUS
  Filled 2022-07-31: qty 50

## 2022-07-31 MED ORDER — POTASSIUM CHLORIDE IN NACL 20-0.9 MEQ/L-% IV SOLN
INTRAVENOUS | Status: DC
  Filled 2022-07-31 (×8): qty 1000

## 2022-07-31 MED ORDER — POTASSIUM CHLORIDE 20 MEQ PO PACK
40.0000 meq | PACK | Freq: Once | ORAL | Status: AC
  Administered 2022-07-31: 40 meq via ORAL
  Filled 2022-07-31: qty 2

## 2022-07-31 MED ORDER — POTASSIUM CHLORIDE 10 MEQ/100ML IV SOLN
10.0000 meq | INTRAVENOUS | Status: AC
  Administered 2022-07-31 – 2022-08-01 (×5): 10 meq via INTRAVENOUS
  Filled 2022-07-31 (×5): qty 100

## 2022-07-31 MED ORDER — BUDESONIDE 0.25 MG/2ML IN SUSP
0.2500 mg | Freq: Two times a day (BID) | RESPIRATORY_TRACT | Status: DC
  Administered 2022-07-31 – 2022-08-16 (×32): 0.25 mg via RESPIRATORY_TRACT
  Filled 2022-07-31 (×34): qty 2

## 2022-07-31 MED ORDER — CALCIUM GLUCONATE-NACL 2-0.675 GM/100ML-% IV SOLN
2.0000 g | Freq: Once | INTRAVENOUS | Status: AC
  Administered 2022-07-31: 2000 mg via INTRAVENOUS
  Filled 2022-07-31: qty 100

## 2022-07-31 MED ORDER — POTASSIUM CHLORIDE CRYS ER 20 MEQ PO TBCR
40.0000 meq | EXTENDED_RELEASE_TABLET | Freq: Two times a day (BID) | ORAL | Status: DC
  Administered 2022-07-31: 40 meq via ORAL
  Filled 2022-07-31 (×2): qty 2

## 2022-07-31 MED ORDER — POTASSIUM CHLORIDE 10 MEQ/100ML IV SOLN
10.0000 meq | INTRAVENOUS | Status: AC
  Administered 2022-07-31 (×3): 10 meq via INTRAVENOUS
  Filled 2022-07-31 (×3): qty 100

## 2022-07-31 MED ORDER — IPRATROPIUM-ALBUTEROL 0.5-2.5 (3) MG/3ML IN SOLN
3.0000 mL | Freq: Three times a day (TID) | RESPIRATORY_TRACT | Status: DC
  Administered 2022-07-31 – 2022-08-02 (×6): 3 mL via RESPIRATORY_TRACT
  Filled 2022-07-31 (×6): qty 3

## 2022-07-31 MED ORDER — AZITHROMYCIN 250 MG PO TABS
500.0000 mg | ORAL_TABLET | Freq: Every day | ORAL | Status: AC
  Administered 2022-07-31 – 2022-08-02 (×3): 500 mg via ORAL
  Filled 2022-07-31 (×3): qty 2

## 2022-07-31 NOTE — Progress Notes (Signed)
PROGRESS NOTE    Frank Hernandez  ZOX:[id number] DOB: November 17, 1940 DOA: 07/29/2022 PCP: [name] (Inactive)   Brief Narrative: 82 year old with past medical history significant for epistaxis, hypertension, prediabetes, stage IIIa CKD, pathological fracture of the right femoral, multiple myeloma who presents to the ED due to wheezing and rhonchi.  EMS gave albuterol and routes.  Patient has been taking antibiotics for cellulitis.  He has had worsening of his right foot wound.  He reports abdominal distention, he report small watery stool.  Evaluation in the ED chest x-ray no active cardiopulmonary disease.  CTA chest showed left lower lobe segmental pulmonary PE with overall minimal clot burden.  CT abdomen with dilated colon with air-fluid level seen and scattered colonic diverticula.  Transition seen in the area of the new left inguinal hernia involving the sigmoid colon.  No pneumatosis.  Small bowel and stomach are known dilated but there is an enteric tube.  Separate large known right inguinal hernia involving the small bowel and significant portion of the urinary bladder.  Lytic bone lesion consistent with known history of myeloma.  Patient admitted with PE and a small bowel obstruction.  Surgery has been consulted.   Assessment & Plan:   Principal Problem:   Bowel obstruction (HCC) Active Problems:   Essential hypertension   Pre-diabetes   Hypokalemia   Chronic kidney disease, stage 3a (HCC)   Normocytic anemia   Multiple myeloma not having achieved remission (HCC)   Hypomagnesemia   Prolonged QT interval   Cellulitis of both lower extremities   Acute pulmonary embolism (HCC)   1-Acute Pulmonary Embolism, Acute DVT left femoral vein -CTA Chest: Left lower lobe segmental pulmonary embolism. Overall minimal clot burden. -Doppler LE: indings consistent with acute deep vein thrombosis involving the left common femoral vein, and left femoral vein. Findings consistent with age  indeterminate deep vein thrombosis  involving the left popliteal vein.  Continue with  Lovenox.  ECHO: RV function Normal.   2-SBO: -Presents with obstructive symptoms, CT abdomen pelvis showed Dilated colon with air-fluid level seen and scattered colonic diverticula. Transition seen in the area of the new left inguinal hernia involving the sigmoid colon. Please correlate for reduce ability. No pneumatosis, free air or fluid. Small bowel and stomach are nondilated but there is an enteric tube. -NG tube  has been removed by General Surgery.  -He is having Multiples watery BM. -Started on Clear liquid diet.   3-Diarrhea; C diff negative. Having issues with Diarrhea since last hospitalization.  GI pathogen  positive for enteropathogenic E coli.  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  Avoid Imodium.  Could consider neostigmine.    4-Metabolic Acidosis: in setting of Diarrhea, dehydration.  Holding Bicarb Gtt due to hypokalemia.  Check B-met this afternoon.  Start Bicarb oral supplement.   5-Cellulitis of both lower extremity: Change Vancomycin to Doxy due to AKI.  Change cefepime to Ancef to avoid confusion.   6-Hypomagnesemia: IV magnesium ordered.   7-Hypokalemia: IV KCL and oral potassium supplement ordered.  Repeat Bmet this afternoon.   Prolonged QT 2: Replete electrolytes. Check EKG before Zithromax given   Respiratory Distress; Wheezing.  Check x ray negative for pulmonary edema. He has profuse diarrhea. Will continue with low rate IV fluids.  Will schedule nebulizer.   Hypertension: PRN Metoprolol.   Pre-Diabetes: On diet controlled.   AKI on CKD stage IIIa: Previous Cr 1.4--1.3 Presents with Cr 1.8.  Related to hypovolemia.  Pre renal.  Improving with fluids.   Normocytic  anemia: anemia of malignancy MM Monitor hb.   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 PRN tylenol  Multiple myeloma Needs to follow up with oncology out patient.  See wound care documentation below.  Pressure Injury 03/29/22 Buttocks Right Stage 1 -  Intact skin with non-blanchable redness of a localized area usually over a bony prominence. (Active)  03/29/22 2140  Location: Buttocks  Location Orientation: Right  Staging: Stage 1 -  Intact skin with non-blanchable redness of a localized area usually over a bony prominence.  Wound Description (Comments):   Present on Admission: Yes     Pressure Injury 05/28/22 Heel Right Stage 3 -  Full thickness tissue loss. Subcutaneous fat may be visible but bone, tendon or muscle are NOT exposed. Open wound, broken from pressure & dry skin (Active)  05/28/22 1400  Location: Heel  Location Orientation: Right  Staging: Stage 3 -  Full thickness tissue loss. Subcutaneous fat may be visible but bone, tendon or muscle are NOT exposed.  Wound Description (Comments): Open wound, broken from pressure & dry skin  Present on Admission: Yes  Dressing Type Foam - Lift dressing to assess site every shift;Honey 07/30/22 2100                  Estimated body mass index is 29.39 kg/m as calculated from the following:   Height as of this encounter: 5\' 9"  (1.753 m).   Weight as of this encounter: 90.3 kg.   DVT prophylaxis: Lovenox Code Status: Full code Family Communication: Wife who was at bedside 5/31 Disposition Plan:  Status is: Inpatient Remains inpatient appropriate because: management of medical problems, AKI, metabolic acidosis. PE    Consultants:  Surgery   Procedures:  ECHO  Antimicrobials:    Subjective: He is breathing well, no wheezing. Denies dyspnea. He is still having diarrhea. LE edema   Objective: Vitals:   07/31/22 0400 07/31/22 0500 07/31/22 0600 07/31/22 0700  BP: (!) 144/63 (!) 161/56 129/69   Pulse: 68 62 69 63  Resp: 16 16 14 18   Temp:      TempSrc:      SpO2:  99% 100% 91% 100%  Weight:      Height:        Intake/Output Summary (Last 24 hours) at 07/31/2022 0707 Last data filed at 07/31/2022 0531 Gross per 24 hour  Intake 1705.87 ml  Output 1350 ml  Net 355.87 ml    Filed Weights   07/29/22 1610  Weight: 90.3 kg    Examination:  General exam: Alert, NAD Respiratory system: CTA Cardiovascular system: S 1, S 2 RRR Gastrointestinal system: BS present, soft, nt Central nervous system: alert Extremities: Right heel with open wound, coin size.    Data Reviewed: I have personally reviewed following labs and imaging studies  CBC: Recent Labs  Lab 07/29/22 0835 07/30/22 0316 07/31/22 0301  WBC 6.0 6.3 5.0  NEUTROABS 2.8  --   --   HGB 9.9* 9.0* 8.2*  HCT 29.8* 27.9* 24.0*  MCV 96.1 99.3 94.5  PLT 252 230 212    Basic Metabolic Panel: Recent Labs  Lab 07/29/22 0835 07/30/22 0316 07/30/22 1521 07/31/22 0301  NA 140 141 141 142  K 3.3* 3.5 4.0 2.4*  CL 119* 122* 122* 120*  CO2 12* 9* 9* 12*  GLUCOSE 94 99 129* 89  BUN 11 13 13 12   CREATININE  1.83* 1.54* 1.39* 1.29*  CALCIUM 6.9* 6.7* 7.1* 6.7*  MG 1.4* 2.0  --  1.5*    GFR: Estimated Creatinine Clearance: 49.9 mL/min (A) (by C-G formula based on SCr of 1.29 mg/dL (H)). Liver Function Tests: Recent Labs  Lab 07/30/22 0316 07/31/22 0301  AST 24 24  ALT 12 13  ALKPHOS 85 80  BILITOT 1.0 0.5  PROT 5.5* 4.8*  ALBUMIN 3.0* 2.7*    No results for input(s): "LIPASE", "AMYLASE" in the last 168 hours. No results for input(s): "AMMONIA" in the last 168 hours. Coagulation Profile: No results for input(s): "INR", "PROTIME" in the last 168 hours. Cardiac Enzymes: No results for input(s): "CKTOTAL", "CKMB", "CKMBINDEX", "TROPONINI" in the last 168 hours. BNP (last 3 results) No results for input(s): "PROBNP" in the last 8760 hours. HbA1C: No results for input(s): "HGBA1C" in the last 72 hours. CBG: Recent Labs  Lab 07/29/22 2333 07/30/22 0555 07/30/22 1149  07/30/22 1742 07/30/22 2335  GLUCAP 114* 83 91 107* 98    Lipid Profile: No results for input(s): "CHOL", "HDL", "LDLCALC", "TRIG", "CHOLHDL", "LDLDIRECT" in the last 72 hours. Thyroid Function Tests: No results for input(s): "TSH", "T4TOTAL", "FREET4", "T3FREE", "THYROIDAB" in the last 72 hours. Anemia Panel: No results for input(s): "VITAMINB12", "FOLATE", "FERRITIN", "TIBC", "IRON", "RETICCTPCT" in the last 72 hours. Sepsis Labs: Recent Labs  Lab 07/29/22 0918 07/29/22 1150  LATICACIDVEN 2.1* 1.8     Recent Results (from the past 240 hour(s))  SARS Coronavirus 2 by RT PCR (hospital order, performed in [facility] hospital lab) *cepheid single result test* Anterior Nasal Swab     Status: None   Collection Time: 07/29/22  8:26 AM   Specimen: Anterior Nasal Swab  Result Value Ref Range Status   SARS Coronavirus 2 by RT PCR NEGATIVE NEGATIVE Final    Comment: (NOTE) SARS-CoV-2 target nucleic acids are NOT DETECTED.  The SARS-CoV-2 RNA is generally detectable in upper and lower respiratory specimens during the acute phase of infection. The lowest concentration of SARS-CoV-2 viral copies this assay can detect is 250 copies / mL. A negative result does not preclude SARS-CoV-2 infection and should not be used as the sole basis for treatment or other patient management decisions.  A negative result may occur with improper specimen collection / handling, submission of specimen other than nasopharyngeal swab, presence of viral mutation(s) within the areas targeted by this assay, and inadequate number of viral copies (<250 copies / mL). A negative result must be combined with clinical observations, patient history, and epidemiological information.  Fact Sheet for Patients:   RoadLapTop.co.za  Fact Sheet for Healthcare Providers: http://kim-miller.com/  This test is not yet approved or  cleared by the [place] FDA and has been  authorized for detection and/or diagnosis of SARS-CoV-2 by FDA under an Emergency Use Authorization (EUA).  This EUA will remain in effect (meaning this test can be used) for the duration of the COVID-19 declaration under Section 564(b)(1) of the Act, 21 U.S.C. section 360bbb-3(b)(1), unless the authorization is terminated or revoked sooner.  Performed at [facility], [address] [place], [place] [address]   Blood culture (routine x 2)     Status: None (Preliminary result)   Collection Time: 07/29/22  9:19 AM   Specimen: BLOOD  Result Value Ref Range Status   Specimen Description   Final    BLOOD PERIPHERAL Performed at [facility], [address] [place], [place] [address]    Special Requests  Final    BOTTLES DRAWN AEROBIC AND ANAEROBIC Blood Culture adequate volume Performed at [facility], [address], [place], [place] [address]    Culture   Final    NO GROWTH < 24 HOURS Performed at [facility], [address], [place], [place] [address]    Report Status PENDING  Incomplete  Blood culture (routine x 2)     Status: None (Preliminary result)   Collection Time: 07/29/22  9:22 AM   Specimen: BLOOD  Result Value Ref Range Status   Specimen Description   Final    BLOOD RIGHT ANTECUBITAL Performed at [facility], [address], [place], [place] [address]    Special Requests   Final    BOTTLES DRAWN AEROBIC AND ANAEROBIC Blood Culture adequate volume Performed at [facility], [address], [place], [place] [address]    Culture   Final    NO GROWTH < 24 HOURS Performed at [facility], [address] [place], [place] [address]    Report Status PENDING  Incomplete  MRSA Next Gen by PCR, Nasal     Status: None   Collection Time: 07/29/22  3:21 PM   Specimen: Nasal Mucosa; Nasal Swab  Result Value Ref Range Status   MRSA by PCR Next Gen NOT DETECTED  NOT DETECTED Final    Comment: (NOTE) The GeneXpert MRSA Assay (FDA approved for NASAL specimens only), is one component of a comprehensive MRSA colonization surveillance program. It is not intended to diagnose MRSA infection nor to guide or monitor treatment for MRSA infections. Test performance is not FDA approved in patients less than 8 years old. Performed at [facility], [address], [place], [place] [address]   C Difficile Quick Screen w PCR reflex     Status: None   Collection Time: 07/30/22  1:46 PM   Specimen: STOOL  Result Value Ref Range Status   C Diff antigen NEGATIVE NEGATIVE Final   C Diff toxin NEGATIVE NEGATIVE Final   C Diff interpretation No C. difficile detected.  Final    Comment: Performed at [facility], [address] [place], [place] [address]         Radiology Studies: [facility] Chest Port 1 View  Result Date: 07/30/2022 CLINICAL DATA:  Shortness of breath. EXAM: PORTABLE CHEST 1 VIEW COMPARISON:  Chest radiograph dated Jul 29, 2022 FINDINGS: The heart size and mediastinal contours are within normal limits. Both lungs are clear. Elevation of the left hemidiaphragm with distended bowel loops in the left upper quadrant. The visualized skeletal structures are unremarkable. IMPRESSION: 1. No acute cardiopulmonary process 2. Elevation of the left hemidiaphragm with distended bowel loops in the left upper quadrant. Electronically Signed   By: Larose Hires D.O.   On: 07/30/2022 22:40   DG KNEE 3 VIEW RIGHT  Result Date: 07/30/2022 CLINICAL DATA:  Right knee pain. EXAM: RIGHT KNEE - 3 VIEW COMPARISON:  Right femur radiographs 06/04/2022 FINDINGS: There is diffuse decreased bone mineralization. Severe patellofemoral joint space narrowing and peripheral osteophytosis. No joint effusion. Moderate posterior proximal tibial degenerative spurring. Moderate to severe medial joint space narrowing and peripheral osteophytosis. Minimal  peripheral lateral chronic degenerative spurring without significant joint space narrowing. A calcified density measuring 6 mm again overlies the distal anterior lateral thigh soft tissues, nonspecific. No acute fracture is seen. No dislocation. Partial visualization of femoral intramedullary nail. 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 Electronically Signed   By: Neita Garnet M.D.   On: 07/30/2022 17:59   VAS [place] LOWER EXTREMITY VENOUS (DVT)  Result Date: 07/30/2022  Lower Venous DVT Study Patient Name:  Frank Hernandez  Date of Exam:   07/30/2022 Medical Rec #: [id number]            Accession #:    [id number] Date of Birth: 03-Sep-1940             Patient Gender: M Patient Age:   29 years Exam Location:  [facility] Procedure:      VAS [place] LOWER EXTREMITY VENOUS (DVT) Referring Phys: Jon Billings Chee Dimon --------------------------------------------------------------------------------  Indications: Edema.  Risk Factors: Confirmed PE. Anticoagulation: Heparin. Limitations: Body habitus, poor ultrasound/tissue interface and patient positioning. Comparison Study: No prior studies. Performing Technologist: Chanda Busing RVT  Examination Guidelines: A complete evaluation includes B-mode imaging, spectral Doppler, color Doppler, and power Doppler as needed of all accessible portions of each vessel. Bilateral testing is considered an integral part of a complete examination. Limited examinations for reoccurring indications may be performed as noted. The reflux portion of the exam is performed with the patient in reverse Trendelenburg.  +---------+---------------+---------+-----------+----------+-------------------+ RIGHT    CompressibilityPhasicitySpontaneityPropertiesThrombus Aging      +---------+---------------+---------+-----------+----------+-------------------+ CFV      Full           Yes      Yes                                       +---------+---------------+---------+-----------+----------+-------------------+ SFJ      Full                                                             +---------+---------------+---------+-----------+----------+-------------------+ FV Prox  Full                                                             +---------+---------------+---------+-----------+----------+-------------------+ FV Mid                  Yes      Yes                                      +---------+---------------+---------+-----------+----------+-------------------+ FV Distal               Yes      Yes                                      +---------+---------------+---------+-----------+----------+-------------------+ PFV      Full                                                             +---------+---------------+---------+-----------+----------+-------------------+  POP      Full           Yes      Yes                                      +---------+---------------+---------+-----------+----------+-------------------+ PTV      Full                                                             +---------+---------------+---------+-----------+----------+-------------------+ PERO                                                  Not well visualized +---------+---------------+---------+-----------+----------+-------------------+   +---------+---------------+---------+-----------+----------+-------------------+ LEFT     CompressibilityPhasicitySpontaneityPropertiesThrombus Aging      +---------+---------------+---------+-----------+----------+-------------------+ CFV      Partial        Yes      Yes                  Acute               +---------+---------------+---------+-----------+----------+-------------------+ SFJ      Full                                                             +---------+---------------+---------+-----------+----------+-------------------+ FV  Prox  Partial        Yes      No                   Acute               +---------+---------------+---------+-----------+----------+-------------------+ FV Mid   Partial        Yes      Yes                  Age Indeterminate   +---------+---------------+---------+-----------+----------+-------------------+ FV Distal                                             Not well visualized +---------+---------------+---------+-----------+----------+-------------------+ PFV      Full           Yes      Yes                                      +---------+---------------+---------+-----------+----------+-------------------+ POP      Partial        Yes      Yes                  Age Indeterminate   +---------+---------------+---------+-----------+----------+-------------------+ PTV      Full                                                             +---------+---------------+---------+-----------+----------+-------------------+  PERO                                                  Not well visualized +---------+---------------+---------+-----------+----------+-------------------+ Thrombus located in the common femoral vein is noted to be in the distal segment.    Summary: RIGHT: - There is no evidence of deep vein thrombosis in the lower extremity. However, portions of this examination were limited- see technologist comments above.  - No cystic structure found in the popliteal fossa.  LEFT: - Findings consistent with acute deep vein thrombosis involving the left common femoral vein, and left femoral vein. - Findings consistent with age indeterminate deep vein thrombosis involving the left popliteal vein. - No cystic structure found in the popliteal fossa.  *See table(s) above for measurements and observations. Electronically signed by Sherald Hess MD on 07/30/2022 at 4:42:31 PM.    Final    ECHOCARDIOGRAM COMPLETE  Result Date: 07/30/2022    ECHOCARDIOGRAM REPORT   Patient  Name:   Frank Hernandez Date of Exam: 07/30/2022 Medical Rec #:  [id number]           Height:       69.0 in Accession #:    [id number]          Weight:       199.0 lb Date of Birth:  April 01, 1940            BSA:          2.062 m Patient Age:    82 years            BP:           156/65 mmHg Patient Gender: M                   HR:           74 bpm. Exam Location:  Inpatient Procedure: 2D Echo, Color Doppler, Cardiac Doppler and Intracardiac            Opacification Agent Indications:    Pulmonary Embolus  History:        Patient has prior history of Echocardiogram examinations, most                 recent 04/22/2022. CKD, Signs/Symptoms:Syncope; Risk                 Factors:Hypertension.  Sonographer:    Milbert Coulter Referring Phys: [id number] DAVID MANUEL ORTIZ  Sonographer Comments: Technically difficult study due to poor echo windows, suboptimal parasternal window, suboptimal apical window and no subcostal window. Image acquisition challenging due to patient body habitus and Image acquisition challenging due to uncooperative patient. IMPRESSIONS  1. Left ventricular ejection fraction, by estimation, is 65 to 70%. The left ventricle has normal function. The left ventricle has no regional wall motion abnormalities. Left ventricular diastolic parameters are consistent with Grade I diastolic dysfunction (impaired relaxation).  2. Right ventricular systolic function is normal. The right ventricular size is normal.  3. The mitral valve is normal in structure. Trivial mitral valve regurgitation. No evidence of mitral stenosis.  4. The aortic valve is normal in structure. Aortic valve regurgitation is trivial. No aortic stenosis is present.  5. The inferior vena cava is normal in size with greater than 50% respiratory variability, suggesting right atrial pressure of 3 mmHg. FINDINGS  Left Ventricle: Left  ventricular ejection fraction, by estimation, is 65 to 70%. The left ventricle has normal function. The left ventricle has no  regional wall motion abnormalities. Definity contrast agent was given IV to delineate the left ventricular  endocardial borders. The left ventricular internal cavity size was normal in size. There is no left ventricular hypertrophy. Left ventricular diastolic parameters are consistent with Grade I diastolic dysfunction (impaired relaxation). Right Ventricle: The right ventricular size is normal. No increase in right ventricular wall thickness. Right ventricular systolic function is normal. Left Atrium: Left atrial size was normal in size. Right Atrium: Right atrial size was normal in size. Pericardium: There is no evidence of pericardial effusion. Mitral Valve: The mitral valve is normal in structure. Trivial mitral valve regurgitation. No evidence of mitral valve stenosis. Tricuspid Valve: The tricuspid valve is normal in structure. Tricuspid valve regurgitation is mild . No evidence of tricuspid stenosis. Aortic Valve: The aortic valve is normal in structure. Aortic valve regurgitation is trivial. Aortic regurgitation PHT measures 434 msec. No aortic stenosis is present. Aortic valve mean gradient measures 6.0 mmHg. Aortic valve peak gradient measures 10.8 mmHg. Aortic valve area, by VTI measures 2.35 cm. Pulmonic Valve: The pulmonic valve was normal in structure. Pulmonic valve regurgitation is not visualized. No evidence of pulmonic stenosis. Aorta: The aortic root is normal in size and structure. Venous: The inferior vena cava is normal in size with greater than 50% respiratory variability, suggesting right atrial pressure of 3 mmHg. IAS/Shunts: No atrial level shunt detected by color flow Doppler.  LEFT VENTRICLE PLAX 2D LVIDd:         3.30 cm LVIDs:         2.60 cm LV PW:         1.50 cm LV IVS:        1.60 cm LVOT diam:     2.20 cm LV SV:         64 LV SV Index:   31 LVOT Area:     3.80 cm  RIGHT VENTRICLE TAPSE (M-mode): 1.8 cm AORTIC VALVE AV Area (Vmax):    2.53 cm AV Area (Vmean):   2.59 cm AV Area  (VTI):     2.35 cm AV Vmax:           164.00 cm/s AV Vmean:          107.000 cm/s AV VTI:            0.271 m AV Peak Grad:      10.8 mmHg AV Mean Grad:      6.0 mmHg LVOT Vmax:         109.20 cm/s LVOT Vmean:        72.850 cm/s LVOT VTI:          0.167 m LVOT/AV VTI ratio: 0.62 AI PHT:            434 msec  AORTA Ao Root diam: 4.30 cm TRICUSPID VALVE TR Peak grad:   42.0 mmHg TR Vmax:        324.00 cm/s  SHUNTS Systemic VTI:  0.17 m Systemic Diam: 2.20 cm Chilton Si MD Electronically signed by Chilton Si MD Signature Date/Time: 07/30/2022/3:47:49 PM    Final    DG Abd 1 View  Result Date: 07/30/2022 CLINICAL DATA:  Small bowel obstruction. EXAM: ABDOMEN - 1 VIEW COMPARISON:  Jul 29, 2022. FINDINGS: Stable marked gaseous distention of colon is noted. No definite small bowel dilatation is noted. Distal tip of nasogastric tube  is seen in expected position of proximal stomach. IMPRESSION: Stable gaseous distention of colon most consistent with ileus or distal colonic obstruction. Electronically Signed   By: Lupita Raider M.D.   On: 07/30/2022 09:04   DG Ankle Complete Right  Result Date: 07/29/2022 CLINICAL DATA:  Ankle wound. EXAM: RIGHT ANKLE - COMPLETE 3+ VIEW COMPARISON:  None Available. FINDINGS: There is no evidence of fracture, dislocation, or joint effusion. There is no evidence of arthropathy or other focal bone abnormality. Vascular calcifications are noted. No definite lytic destruction is noted. No definite soft tissue wound is noted. IMPRESSION: No acute abnormality. Electronically Signed   By: Lupita Raider M.D.   On: 07/29/2022 10:39   CT Angio Chest PE W and/or Wo Contrast  Result Date: 07/29/2022 CLINICAL DATA:  Dyspnea, shortness of breath. 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 EXAM: CT ANGIOGRAPHY CHEST CT ABDOMEN AND PELVIS WITH CONTRAST TECHNIQUE: Multidetector CT imaging of the chest was performed using the standard protocol during bolus  administration of intravenous contrast. Multiplanar CT image reconstructions and MIPs were obtained to evaluate the vascular anatomy. Multidetector CT imaging of the abdomen and pelvis was performed using the standard protocol during bolus administration of intravenous contrast. RADIATION DOSE REDUCTION: This exam was performed according to the departmental dose-optimization program which includes automated exposure control, adjustment of the mA and/or kV according to patient size and/or use of iterative reconstruction technique. CONTRAST:  OMNIPAQUE IOHEXOL 350 MG/ML SOLN COMPARISON:  Standard CT with contrast 04/14/2022. Chest x-ray earlier 07/29/2018. PET-CT scan 05/20/2018 FINDINGS: CTA CHEST FINDINGS Cardiovascular: There is some mild breathing motion throughout the examination. Heart is nonenlarged. No pericardial effusion. Coronary artery calcifications are seen. The thoracic aorta has a overall normal caliber with some scattered atherosclerotic plaque. There is some pulsation artifact along the ascending aorta. There is some enlargement of the main pulmonary artery. Please correlate for pulmonary artery hypertension. There is a left lower lobe pulmonary embolism along segmental vessel. No other substantial pulmonary emboli are identified. Minimal clot burden. Mediastinum/Nodes: No specific abnormal lymph node enlargement identified in the axillary regions, hilum or mediastinum. Preserved thyroid gland. Enteric tube along the esophagus with tip extending into the upper stomach. Lungs/Pleura: Breathing motion. No consolidation, pneumothorax or effusion. Mild linear opacity at the bases likely scar or atelectasis. Musculoskeletal: Curvature and degenerative changes along the spine. Review of the MIP images confirms the above findings. CT ABDOMEN and PELVIS FINDINGS Hepatobiliary: Stable small hepatic benign-appearing cystic lesions. No specific imaging follow-up. Gallbladder is contracted. Patent portal  vein. Pancreas: Mild global pancreatic atrophy.  No pancreatic mass. Spleen: Normal in size without focal abnormality. Adrenals/Urinary Tract: The adrenal glands are preserved. 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 No specific imaging follow-up. No collecting system dilatation. The ureters have normal course and caliber extending down to the bladder. Bladder has preserved contour but a portion of the right side of the bladder extends into the right separate inguinal hernia. Stomach/Bowel: On this non oral contrast exam the stomach is nondilated. Small bowel is nondilated. Large bowel is dilated. Right-sided has dimensions approaching up to 10 cm in diameter. There are air-fluid levels. The transverse colon is slightly less dilated as is the descending colon with some scattered diverticula along the descending and sigmoid colon. The previous left inguinal hernia which contain only fat, now involves the sigmoid colon and there is a transition from the dilatation to the nondilated at the level of the inguinal hernia.  Please correlate with reduced stability. There is a separate large right inguinal hernia which was seen previous involving mostly the bladder but also significant small bowel and mesenteric fat. Again the small bowel is nondilated. Vascular/Lymphatic: Aortic atherosclerosis. No enlarged abdominal or pelvic lymph nodes. Reproductive: Prostate is unremarkable. Other: Anasarca.  No free intra-abdominal air or free fluid. Musculoskeletal: Streak artifact related to the patient's bilateral dynamic hip screws. Degenerative changes of the spine and pelvis. Once again there are scattered lytic lesions along the pelvis and lumbar spine as previously described. Review of the MIP images confirms the above findings. Critical Value/emergent results were called by telephone at the time of interpretation on 07/29/2022 at 7:34 am to provider Vivien Rossetti , who verbally acknowledged these results.  IMPRESSION: Left lower lobe segmental pulmonary embolism. Overall minimal clot burden. Breathing motion. Dilated colon with air-fluid level seen and scattered colonic diverticula. Transition seen in the area of the new left inguinal hernia involving the sigmoid colon. Please correlate for reduce ability. No pneumatosis, free air or fluid. Small bowel and stomach are nondilated but there is an enteric tube. Separate large known right inguinal hernia involving small bowel and significant portion of the urinary bladder. Enteric tube in the stomach. Lytic bone lesions consistent with known history of myeloma. Electronically Signed   By: Karen Kays M.D.   On: 07/29/2022 10:38   CT ABDOMEN PELVIS W CONTRAST  Result Date: 07/29/2022 CLINICAL DATA:  Dyspnea, shortness of breath. 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 EXAM: CT ANGIOGRAPHY CHEST CT ABDOMEN AND PELVIS WITH CONTRAST TECHNIQUE: Multidetector CT imaging of the chest was performed using the standard protocol during bolus administration of intravenous contrast. Multiplanar CT image reconstructions and MIPs were obtained to evaluate the vascular anatomy. Multidetector CT imaging of the abdomen and pelvis was performed using the standard protocol during bolus administration of intravenous contrast. RADIATION DOSE REDUCTION: This exam was performed according to the departmental dose-optimization program which includes automated exposure control, adjustment of the mA and/or kV according to patient size and/or use of iterative reconstruction technique. CONTRAST:  OMNIPAQUE IOHEXOL 350 MG/ML SOLN COMPARISON:  Standard CT with contrast 04/14/2022. Chest x-ray earlier 07/29/2018. PET-CT scan 05/20/2018 FINDINGS: CTA CHEST FINDINGS Cardiovascular: There is some mild breathing motion throughout the examination. Heart is nonenlarged. No pericardial effusion. Coronary artery calcifications are seen. The thoracic aorta has a overall normal  caliber with some scattered atherosclerotic plaque. There is some pulsation artifact along the ascending aorta. There is some enlargement of the main pulmonary artery. Please correlate for pulmonary artery hypertension. There is a left lower lobe pulmonary embolism along segmental vessel. No other substantial pulmonary emboli are identified. Minimal clot burden. Mediastinum/Nodes: No specific abnormal lymph node enlargement identified in the axillary regions, hilum or mediastinum. Preserved thyroid gland. Enteric tube along the esophagus with tip extending into the upper stomach. Lungs/Pleura: Breathing motion. No consolidation, pneumothorax or effusion. Mild linear opacity at the bases likely scar or atelectasis. Musculoskeletal: Curvature and degenerative changes along the spine. Review of the MIP images confirms the above findings. CT ABDOMEN and PELVIS FINDINGS Hepatobiliary: Stable small hepatic benign-appearing cystic lesions. No specific imaging follow-up. Gallbladder is contracted. Patent portal vein. Pancreas: Mild global pancreatic atrophy.  No pancreatic mass. Spleen: Normal in size without focal abnormality. Adrenals/Urinary Tract: The adrenal glands are preserved. 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 No specific imaging follow-up. No collecting system dilatation. The ureters have normal course and caliber extending down to  the bladder. Bladder has preserved contour but a portion of the right side of the bladder extends into the right separate inguinal hernia. Stomach/Bowel: On this non oral contrast exam the stomach is nondilated. Small bowel is nondilated. Large bowel is dilated. Right-sided has dimensions approaching up to 10 cm in diameter. There are air-fluid levels. The transverse colon is slightly less dilated as is the descending colon with some scattered diverticula along the descending and sigmoid colon. The previous left inguinal hernia which contain only fat, now  involves the sigmoid colon and there is a transition from the dilatation to the nondilated at the level of the inguinal hernia. Please correlate with reduced stability. There is a separate large right inguinal hernia which was seen previous involving mostly the bladder but also significant small bowel and mesenteric fat. Again the small bowel is nondilated. Vascular/Lymphatic: Aortic atherosclerosis. No enlarged abdominal or pelvic lymph nodes. Reproductive: Prostate is unremarkable. Other: Anasarca.  No free intra-abdominal air or free fluid. Musculoskeletal: Streak artifact related to the patient's bilateral dynamic hip screws. Degenerative changes of the spine and pelvis. Once again there are scattered lytic lesions along the pelvis and lumbar spine as previously described. Review of the MIP images confirms the above findings. Critical Value/emergent results were called by telephone at the time of interpretation on 07/29/2022 at 7:34 am to provider Vivien Rossetti , who verbally acknowledged these results. IMPRESSION: Left lower lobe segmental pulmonary embolism. Overall minimal clot burden. Breathing motion. Dilated colon with air-fluid level seen and scattered colonic diverticula. Transition seen in the area of the new left inguinal hernia involving the sigmoid colon. Please correlate for reduce ability. No pneumatosis, free air or fluid. Small bowel and stomach are nondilated but there is an enteric tube. Separate large known right inguinal hernia involving small bowel and significant portion of the urinary bladder. Enteric tube in the stomach. Lytic bone lesions consistent with known history of myeloma. Electronically Signed   By: Karen Kays M.D.   On: 07/29/2022 10:38   DG Abd 1 View  Result Date: 07/29/2022 CLINICAL DATA:  NG tube placement EXAM: ABDOMEN - 1 VIEW COMPARISON:  Same day CT abdomen/pelvis FINDINGS: The enteric catheter tip and sidehole project over the expected location of the stomach.  There is marked gaseous distention of the large bowel as seen on same-day CT. There is no definite free intraperitoneal air, within the confines of supine technique. IMPRESSION: Enteric catheter tip and sidehole project over the stomach. Electronically Signed   By: Lesia Hausen M.D.   On: 07/29/2022 10:34   DG Chest 2 View  Result Date: 07/29/2022 CLINICAL DATA:  shortness of breath, wheezing EXAM: CHEST - 2 VIEW COMPARISON:  PET-CT March 21, 24. FINDINGS: The heart size and mediastinal contours are within normal limits. Both lungs are clear. No visible pleural effusions or pneumothorax. The visualized skeletal structures are unremarkable. Gaseous distension of visualized bowel loops in the upper abdomen. Recommend dedicated KUB to further evaluate. IMPRESSION: 1. No active cardiopulmonary disease. 2. Gaseous distension of visualized bowel loops in the upper abdomen. Recommend dedicated KUB to further evaluate. Electronically Signed   By: Feliberto Harts M.D.   On: 07/29/2022 08:56        Scheduled Meds:  acyclovir  400 mg Oral QHS   Chlorhexidine Gluconate Cloth  6 each Topical Daily   enoxaparin (LOVENOX) injection  1 mg/kg Subcutaneous Q12H   ipratropium-albuterol  3 mL Nebulization BID   leptospermum manuka honey  1 Application Topical  Daily   pantoprazole (PROTONIX) IV  40 mg Intravenous Q24H   potassium chloride  40 mEq Oral Once   sodium bicarbonate  1,300 mg Oral TID   Continuous Infusions:   ceFAZolin (ANCEF) IV Stopped (07/31/22 0548)   doxycycline (VIBRAMYCIN) IV Stopped (07/30/22 2149)   potassium chloride 10 mEq (07/31/22 0641)     LOS: 2 days    Time spent: 35 minutes.     Alba Cory, MD [facility]   If 7PM-7AM, please contact night-coverage [email or web address]  07/31/2022, 7:07 AM

## 2022-07-31 NOTE — Consult Note (Signed)
Consultation  Referring Provider:     Dr. Sunnie Nielsen Primary Care Physician:  [name] (Inactive) Primary Gastroenterologist:       Dr. Myrtie Neither  Reason for Consultation:     Suspected Bowel obstruction, diarrhea         HPI:   MUNEER GURGANIOUS is a 82 y.o. male with multiple myeloma currently receiving treatment, CKD stage III, history of pathologic hip fracture who was brought to the emergency department via EMS from home with symptoms of wheezing, but also with decreased appetite and abdominal distention.   Chest x-ray on admission showed no significant pulmonary disease, but a CT a showed a left lower lobe segmental pulmonary embolism.  A CT of the abdomen showed a dilated colon with air-fluid levels with a suspected transition in the area of a left inguinal hernia involving the sigmoid colon.  He was also noted to have cellulitis of bilateral lower extremities and started on cefepime and Flagyl, which was subsequently changed to doxycycline due to renal insufficiency and Ancef.  He was started on  heparin drip for his pulmonary embolism.  Surgery saw the patient on admission and recommended continued supportive care with NG tube decompression.  Low concern for colonic obstruction and hernia given reducibility of hernia on exam and passage of large liquid bowel movements.  C. difficile testing was notable for a positive antigen, but negative toxin.  GI PCR panel is still pending.  He has continued to pass multiple large volume loose stools and has had improved abdominal distention.  His NG tube was removed yesterday and he has been tolerating a liquid diet with no worsening of his symptoms.  On my discussion with the patient, he tells me that he has been having problems with diarrhea since March, around the same time that he started his chemotherapy for multiple myeloma.  He tells me that his oncologist told him to expect having diarrhea as a side effect of the therapy.  He typically  has been having 3-4 loose bowel movements, usually with incontinence.  Sometimes he can make it to the bathroom, other times he cannot.  He has had nocturnal bowel movements.  He has been wearing diapers at home.  He has not been having problems with abdominal pain or blood in the stool.   He denies having problems with diarrhea prior to March.  He has had some formed stools over the past few months, but the majority have been loose.  Since he has been in the hospital, he has only had liquid stools. He has had decreased appetite and p.o. intake, which he attributes to a general disinterest in food.  He sometimes has nausea and vomiting. He has been taking Imodium, typically 2 tablets a day, but cannot tell me for how long, or when his last dose was.  He reports developing abdominal distention a couple days before presenting to the emergency department.  This has not been a problem for him in the past.  Currently, he states that his abdominal distention is much improved from when he was admitted.  He denies receiving any antibiotics recently prior to admission.  Past Medical History:  Diagnosis Date   Bleeding from the nose    Hypertension    Pre-diabetes     Past Surgical History:  Procedure Laterality Date   COLONOSCOPY     INTRAMEDULLARY (IM) NAIL INTERTROCHANTERIC Left 06/11/2020   Procedure: INTRAMEDULLARY (IM) NAIL INTERTROCHANTRIC;  Surgeon: Roby Lofts, MD;  Location: [facility] OR;  Service:  Orthopedics;  Laterality: Left;   INTRAMEDULLARY (IM) NAIL INTERTROCHANTERIC Right 04/17/2022   Procedure: INTRAMEDULLARY (IM) NAIL INTERTROCHANTERIC;  Surgeon: London Sheer, MD;  Location: [facility];  Service: Orthopedics;  Laterality: Right;   KNEE ARTHROSCOPY Bilateral    POLYPECTOMY      Family History  Problem Relation Age of Onset   Colon cancer Neg Hx      Social History   Tobacco Use   Smoking status: Never   Smokeless tobacco: Never  Vaping Use   Vaping Use: Never used   Substance Use Topics   Alcohol use: No    Alcohol/week: 0.0 standard drinks of alcohol   Drug use: No    Prior to Admission medications   Medication Sig Start Date End Date Taking? Authorizing Provider  acetaminophen (TYLENOL) 500 MG tablet Take 1,000 mg by mouth daily as needed for moderate pain.   Yes [provider]  acyclovir (ZOVIRAX) 400 MG tablet Take 1 tablet (400 mg total) by mouth daily. 06/25/22  Yes Johney Maine, MD  aspirin EC 81 MG tablet Take 1 tablet (81 mg total) by mouth daily. Swallow whole. 06/02/22  Yes Rodolph Bong, MD  feeding supplement (ENSURE ENLIVE / ENSURE PLUS) LIQD Take 237 mLs by mouth 2 (two) times daily between meals. Patient taking differently: Take 237 mLs by mouth daily as needed (nurition). 04/03/22  Yes Almon Hercules, MD  ferrous sulfate 325 (65 FE) MG tablet Take 1 tablet (325 mg total) by mouth 2 (two) times daily with a meal. Patient taking differently: Take 325 mg by mouth 3 (three) times a week. 06/01/22  Yes Rodolph Bong, MD  loperamide (IMODIUM) 2 MG capsule Take 1 capsule (2 mg total) by mouth as needed for diarrhea or loose stools. Patient taking differently: Take 2 mg by mouth in the morning and at bedtime. 06/01/22  Yes Rodolph Bong, MD  potassium chloride SA (KLOR-CON M) 20 MEQ tablet Take 2 tablets (40 mEq total) by mouth 2 (two) times daily. Patient taking differently: Take 20 mEq by mouth 3 (three) times daily. 07/09/22  Yes Johney Maine, MD  Vitamin D, Ergocalciferol, (DRISDOL) 1.25 MG (50000 UNIT) CAPS capsule Take 1 capsule (50,000 Units total) by mouth once a week. 07/09/22  Yes Johney Maine, MD  doxazosin (CARDURA) 2 MG tablet Take 2 mg by mouth daily. Patient not taking: Reported on 07/29/2022 06/22/22   [provider]  mirtazapine (REMERON) 15 MG tablet Take 1 tablet (15 mg total) by mouth at bedtime. Patient not taking: Reported on 07/29/2022 07/09/22   Johney Maine, MD   ondansetron (ZOFRAN) 8 MG tablet Take 8 mg by mouth 30 to 60 min prior to Cyclophosphamide administration then take 8 mg every 8 hrs as needed for nausea and vomiting. Patient not taking: Reported on 07/29/2022 05/24/22   Johney Maine, MD  prochlorperazine (COMPAZINE) 10 MG tablet Take 1 tablet (10 mg total) by mouth every 6 (six) hours as needed for nausea or vomiting. Patient not taking: Reported on 07/29/2022 05/24/22   Johney Maine, MD    Current Facility-Administered Medications  Medication Dose Route Frequency Provider Last Rate Last Admin   0.9 % NaCl with KCl 20 mEq/ L  infusion   Intravenous Continuous Regalado, Belkys A, MD       acetaminophen (TYLENOL) tablet 650 mg  650 mg Oral Q6H PRN Bobette Mo, MD       Or   acetaminophen (  TYLENOL) suppository 650 mg  650 mg Rectal Q6H PRN Bobette Mo, MD       acyclovir (ZOVIRAX) tablet 400 mg  400 mg Oral QHS Regalado, Belkys A, MD   400 mg at 07/30/22 2127   budesonide (PULMICORT) nebulizer solution 0.25 mg  0.25 mg Nebulization BID Regalado, Belkys A, MD       calcium gluconate 2 g/ 100 mL sodium chloride IVPB  2 g Intravenous Once Regalado, Belkys A, MD       ceFAZolin (ANCEF) IVPB 2g/100 mL premix  2 g Intravenous Q8H Regalado, Belkys A, MD   Stopped at 07/31/22 0538   Chlorhexidine Gluconate Cloth 2 % PADS 6 each  6 each Topical Daily Regalado, Belkys A, MD   6 each at 07/30/22 2100   doxycycline (VIBRAMYCIN) 100 mg in sodium chloride 0.9 % 250 mL IVPB  100 mg Intravenous Q12H Regalado, Belkys A, MD 125 mL/hr at 07/31/22 0838 100 mg at 07/31/22 0838   enoxaparin (LOVENOX) injection 90 mg  1 mg/kg Subcutaneous Q12H Cindi Carbon, RPH   90 mg at 07/30/22 2128   HYDROmorphone (DILAUDID) injection 0.5 mg  0.5 mg Intravenous Q2H PRN Bobette Mo, MD       ipratropium-albuterol (DUONEB) 0.5-2.5 (3) MG/3ML nebulizer solution 3 mL  3 mL Nebulization TID Regalado, Belkys A, MD       leptospermum manuka honey  (MEDIHONEY) paste 1 Application  1 Application Topical Daily Bobette Mo, MD   1 Application at 07/30/22 0800   metoprolol tartrate (LOPRESSOR) injection 5 mg  5 mg Intravenous Q8H PRN Bobette Mo, MD       Oral care mouth rinse  15 mL Mouth Rinse PRN Bobette Mo, MD       pantoprazole (PROTONIX) injection 40 mg  40 mg Intravenous Q24H Bobette Mo, MD   40 mg at 07/30/22 1344   potassium chloride 10 mEq in 100 mL IVPB  10 mEq Intravenous Q1 Hr x 3 Anthoney Harada, NP 100 mL/hr at 07/31/22 0835 10 mEq at 07/31/22 0835   potassium chloride SA (KLOR-CON M) CR tablet 40 mEq  40 mEq Oral Once Anthoney Harada, NP       prochlorperazine (COMPAZINE) injection 5 mg  5 mg Intravenous Q4H PRN Bobette Mo, MD       sodium bicarbonate tablet 1,300 mg  1,300 mg Oral TID Regalado, Belkys A, MD   1,300 mg at 07/31/22 0831    Allergies as of 07/29/2022   (No Known Allergies)     Review of Systems:    As per HPI, otherwise negative    Physical Exam:  Vital signs in last 24 hours: Temp:  [97.3 F (36.3 C)-98.3 F (36.8 C)] 98.1 F (36.7 C) (06/01 0800) Pulse Rate:  [62-80] 63 (06/01 0700) Resp:  [14-23] 18 (06/01 0700) BP: (125-167)/(52-86) 129/69 (06/01 0600) SpO2:  [91 %-100 %] 100 % (06/01 0722) Last BM Date : 07/31/22 General:   Pleasant chronically ill appearing African-American male lying in bed, in NAD Head:  Normocephalic and atraumatic. Eyes:   No icterus.   Conjunctiva pink. Ears:  Normal auditory acuity. Neck:  Supple Lungs:  Respirations even and unlabored. Lungs clear to auscultation bilaterally.   No wheezes, crackles, or rhonchi.  Heart:  Regular rate and rhythm; no MRG Abdomen:  Soft, nondistended, nontender.  Bowel sounds are hyperactive and high-pitched. No appreciable masses or hepatomegaly.  Rectal:  Not performed.  Msk:  Symmetrical  without gross deformities.  Extremities: Bilateral heel wounds with dressings, not removed Neurologic:   Alert and  oriented x4;  grossly normal neurologically. Skin:  Intact without significant lesions or rashes. Psych:  Alert and cooperative. Normal affect.  LAB RESULTS: Recent Labs    07/29/22 0835 07/30/22 0316 07/31/22 0301  WBC 6.0 6.3 5.0  HGB 9.9* 9.0* 8.2*  HCT 29.8* 27.9* 24.0*  PLT 252 230 212   BMET Recent Labs    07/30/22 0316 07/30/22 1521 07/31/22 0301  NA 141 141 142  K 3.5 4.0 2.4*  CL 122* 122* 120*  CO2 9* 9* 12*  GLUCOSE 99 129* 89  BUN 13 13 12   CREATININE 1.54* 1.39* 1.29*  CALCIUM 6.7* 7.1* 6.7*   LFT Recent Labs    07/31/22 0301  PROT 4.8*  ALBUMIN 2.7*  AST 24  ALT 13  ALKPHOS 80  BILITOT 0.5   PT/INR No results for input(s): "LABPROT", "INR" in the last 72 hours.  STUDIES: DG Chest Port 1 View  Result Date: 07/30/2022 CLINICAL DATA:  Shortness of breath. EXAM: PORTABLE CHEST 1 VIEW COMPARISON:  Chest radiograph dated Jul 29, 2022 FINDINGS: The heart size and mediastinal contours are within normal limits. Both lungs are clear. Elevation of the left hemidiaphragm with distended bowel loops in the left upper quadrant. The visualized skeletal structures are unremarkable. IMPRESSION: 1. No acute cardiopulmonary process 2. Elevation of the left hemidiaphragm with distended bowel loops in the left upper quadrant. Electronically Signed   By: Larose Hires D.O.   On: 07/30/2022 22:40   DG KNEE 3 VIEW RIGHT  Result Date: 07/30/2022 CLINICAL DATA:  Right knee pain. EXAM: RIGHT KNEE - 3 VIEW COMPARISON:  Right femur radiographs 06/04/2022 FINDINGS: There is diffuse decreased bone mineralization. Severe patellofemoral joint space narrowing and peripheral osteophytosis. No joint effusion. Moderate posterior proximal tibial degenerative spurring. Moderate to severe medial joint space narrowing and peripheral osteophytosis. Minimal peripheral lateral chronic degenerative spurring without significant joint space narrowing. A calcified density measuring 6 mm  again overlies the distal anterior lateral thigh soft tissues, nonspecific. No acute fracture is seen. No dislocation. Partial visualization of femoral intramedullary nail. 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 Electronically Signed   By: Neita Garnet M.D.   On: 07/30/2022 17:59   VAS [place] LOWER EXTREMITY VENOUS (DVT)  Result Date: 07/30/2022  Lower Venous DVT Study Patient Name:  MERT SUGIMOTO  Date of Exam:   07/30/2022 Medical Rec #: [id number]            Accession #:    [id number] Date of Birth: [birth year]             Patient Gender: M Patient Age:   32 years Exam Location:  [facility] Procedure:      VAS [place] LOWER EXTREMITY VENOUS (DVT) Referring Phys: Jon Billings REGALADO --------------------------------------------------------------------------------  Indications: Edema.  Risk Factors: Confirmed PE. Anticoagulation: Heparin. Limitations: Body habitus, poor ultrasound/tissue interface and patient positioning. Comparison Study: No prior studies. Performing Technologist: Chanda Busing RVT  Examination Guidelines: A complete evaluation includes B-mode imaging, spectral Doppler, color Doppler, and power Doppler as needed of all accessible portions of each vessel. Bilateral testing is considered an integral part of a complete examination. Limited examinations for reoccurring indications may be performed as noted. The reflux portion of the exam is performed with the patient in reverse Trendelenburg.  +---------+---------------+---------+-----------+----------+-------------------+ RIGHT    CompressibilityPhasicitySpontaneityPropertiesThrombus Aging      +---------+---------------+---------+-----------+----------+-------------------+ CFV  Full           Yes      Yes                                      +---------+---------------+---------+-----------+----------+-------------------+ SFJ      Full                                                              +---------+---------------+---------+-----------+----------+-------------------+ FV Prox  Full                                                             +---------+---------------+---------+-----------+----------+-------------------+ FV Mid                  Yes      Yes                                      +---------+---------------+---------+-----------+----------+-------------------+ FV Distal               Yes      Yes                                      +---------+---------------+---------+-----------+----------+-------------------+ PFV      Full                                                             +---------+---------------+---------+-----------+----------+-------------------+ POP      Full           Yes      Yes                                      +---------+---------------+---------+-----------+----------+-------------------+ PTV      Full                                                             +---------+---------------+---------+-----------+----------+-------------------+ PERO                                                  Not well visualized +---------+---------------+---------+-----------+----------+-------------------+   +---------+---------------+---------+-----------+----------+-------------------+ LEFT     CompressibilityPhasicitySpontaneityPropertiesThrombus Aging      +---------+---------------+---------+-----------+----------+-------------------+ CFV      Partial        Yes  Yes                  Acute               +---------+---------------+---------+-----------+----------+-------------------+ SFJ      Full                                                             +---------+---------------+---------+-----------+----------+-------------------+ FV Prox  Partial        Yes      No                   Acute                +---------+---------------+---------+-----------+----------+-------------------+ FV Mid   Partial        Yes      Yes                  Age Indeterminate   +---------+---------------+---------+-----------+----------+-------------------+ FV Distal                                             Not well visualized +---------+---------------+---------+-----------+----------+-------------------+ PFV      Full           Yes      Yes                                      +---------+---------------+---------+-----------+----------+-------------------+ POP      Partial        Yes      Yes                  Age Indeterminate   +---------+---------------+---------+-----------+----------+-------------------+ PTV      Full                                                             +---------+---------------+---------+-----------+----------+-------------------+ PERO                                                  Not well visualized +---------+---------------+---------+-----------+----------+-------------------+ Thrombus located in the common femoral vein is noted to be in the distal segment.    Summary: RIGHT: - There is no evidence of deep vein thrombosis in the lower extremity. However, portions of this examination were limited- see technologist comments above.  - No cystic structure found in the popliteal fossa.  LEFT: - Findings consistent with acute deep vein thrombosis involving the left common femoral vein, and left femoral vein. - Findings consistent with age indeterminate deep vein thrombosis involving the left popliteal vein. - No cystic structure found in the popliteal fossa.  *See table(s) above for measurements and observations. Electronically signed by Sherald Hess MD on 07/30/2022 at 4:42:31 PM.    Final    ECHOCARDIOGRAM COMPLETE  Result  Date: 07/30/2022    ECHOCARDIOGRAM REPORT   Patient Name:   JERMARCUS GRAUBERGER Date of Exam: 07/30/2022 Medical Rec #:  [id number]            Height:       69.0 in Accession #:    [id number]          Weight:       199.0 lb Date of Birth:  1940/07/20            BSA:          2.062 m Patient Age:    82 years            BP:           156/65 mmHg Patient Gender: M                   HR:           74 bpm. Exam Location:  Inpatient Procedure: 2D Echo, Color Doppler, Cardiac Doppler and Intracardiac            Opacification Agent Indications:    Pulmonary Embolus  History:        Patient has prior history of Echocardiogram examinations, most                 recent 04/22/2022. CKD, Signs/Symptoms:Syncope; Risk                 Factors:Hypertension.  Sonographer:    Milbert Coulter Referring Phys: [id number] DAVID MANUEL ORTIZ  Sonographer Comments: Technically difficult study due to poor echo windows, suboptimal parasternal window, suboptimal apical window and no subcostal window. Image acquisition challenging due to patient body habitus and Image acquisition challenging due to uncooperative patient. IMPRESSIONS  1. Left ventricular ejection fraction, by estimation, is 65 to 70%. The left ventricle has normal function. The left ventricle has no regional wall motion abnormalities. Left ventricular diastolic parameters are consistent with Grade I diastolic dysfunction (impaired relaxation).  2. Right ventricular systolic function is normal. The right ventricular size is normal.  3. The mitral valve is normal in structure. Trivial mitral valve regurgitation. No evidence of mitral stenosis.  4. The aortic valve is normal in structure. Aortic valve regurgitation is trivial. No aortic stenosis is present.  5. The inferior vena cava is normal in size with greater than 50% respiratory variability, suggesting right atrial pressure of 3 mmHg. FINDINGS  Left Ventricle: Left ventricular ejection fraction, by estimation, is 65 to 70%. The left ventricle has normal function. The left ventricle has no regional wall motion abnormalities. Definity contrast agent was given IV to  delineate the left ventricular  endocardial borders. The left ventricular internal cavity size was normal in size. There is no left ventricular hypertrophy. Left ventricular diastolic parameters are consistent with Grade I diastolic dysfunction (impaired relaxation). Right Ventricle: The right ventricular size is normal. No increase in right ventricular wall thickness. Right ventricular systolic function is normal. Left Atrium: Left atrial size was normal in size. Right Atrium: Right atrial size was normal in size. Pericardium: There is no evidence of pericardial effusion. Mitral Valve: The mitral valve is normal in structure. Trivial mitral valve regurgitation. No evidence of mitral valve stenosis. Tricuspid Valve: The tricuspid valve is normal in structure. Tricuspid valve regurgitation is mild . No evidence of tricuspid stenosis. Aortic Valve: The aortic valve is normal in structure. Aortic valve regurgitation is trivial. Aortic regurgitation PHT measures 434 msec. No aortic stenosis is present. Aortic valve  mean gradient measures 6.0 mmHg. Aortic valve peak gradient measures 10.8 mmHg. Aortic valve area, by VTI measures 2.35 cm. Pulmonic Valve: The pulmonic valve was normal in structure. Pulmonic valve regurgitation is not visualized. No evidence of pulmonic stenosis. Aorta: The aortic root is normal in size and structure. Venous: The inferior vena cava is normal in size with greater than 50% respiratory variability, suggesting right atrial pressure of 3 mmHg. IAS/Shunts: No atrial level shunt detected by color flow Doppler.  LEFT VENTRICLE PLAX 2D LVIDd:         3.30 cm LVIDs:         2.60 cm LV PW:         1.50 cm LV IVS:        1.60 cm LVOT diam:     2.20 cm LV SV:         64 LV SV Index:   31 LVOT Area:     3.80 cm  RIGHT VENTRICLE TAPSE (M-mode): 1.8 cm AORTIC VALVE AV Area (Vmax):    2.53 cm AV Area (Vmean):   2.59 cm AV Area (VTI):     2.35 cm AV Vmax:           164.00 cm/s AV Vmean:          107.000  cm/s AV VTI:            0.271 m AV Peak Grad:      10.8 mmHg AV Mean Grad:      6.0 mmHg LVOT Vmax:         109.20 cm/s LVOT Vmean:        72.850 cm/s LVOT VTI:          0.167 m LVOT/AV VTI ratio: 0.62 AI PHT:            434 msec  AORTA Ao Root diam: 4.30 cm TRICUSPID VALVE TR Peak grad:   42.0 mmHg TR Vmax:        324.00 cm/s  SHUNTS Systemic VTI:  0.17 m Systemic Diam: 2.20 cm Chilton Si MD Electronically signed by Chilton Si MD Signature Date/Time: 07/30/2022/3:47:49 PM    Final    DG Abd 1 View  Result Date: 07/30/2022 CLINICAL DATA:  Small bowel obstruction. EXAM: ABDOMEN - 1 VIEW COMPARISON:  Jul 29, 2022. FINDINGS: Stable marked gaseous distention of colon is noted. No definite small bowel dilatation is noted. Distal tip of nasogastric tube is seen in expected position of proximal stomach. IMPRESSION: Stable gaseous distention of colon most consistent with ileus or distal colonic obstruction. Electronically Signed   By: Lupita Raider M.D.   On: 07/30/2022 09:04   DG Ankle Complete Right  Result Date: 07/29/2022 CLINICAL DATA:  Ankle wound. EXAM: RIGHT ANKLE - COMPLETE 3+ VIEW COMPARISON:  None Available. FINDINGS: There is no evidence of fracture, dislocation, or joint effusion. There is no evidence of arthropathy or other focal bone abnormality. Vascular calcifications are noted. No definite lytic destruction is noted. No definite soft tissue wound is noted. IMPRESSION: No acute abnormality. Electronically Signed   By: Lupita Raider M.D.   On: 07/29/2022 10:39   CT Angio Chest PE W and/or Wo Contrast  Result Date: 07/29/2022 CLINICAL DATA:  Dyspnea, shortness of breath. 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 EXAM: CT ANGIOGRAPHY CHEST CT ABDOMEN AND PELVIS WITH CONTRAST TECHNIQUE: Multidetector CT imaging of the chest was performed using the standard protocol during bolus administration of intravenous contrast. Multiplanar CT  image reconstructions and MIPs  were obtained to evaluate the vascular anatomy. Multidetector CT imaging of the abdomen and pelvis was performed using the standard protocol during bolus administration of intravenous contrast. RADIATION DOSE REDUCTION: This exam was performed according to the departmental dose-optimization program which includes automated exposure control, adjustment of the mA and/or kV according to patient size and/or use of iterative reconstruction technique. CONTRAST:  OMNIPAQUE IOHEXOL 350 MG/ML SOLN COMPARISON:  Standard CT with contrast 04/14/2022. Chest x-ray earlier 07/29/2018. PET-CT scan 05/20/2018 FINDINGS: CTA CHEST FINDINGS Cardiovascular: There is some mild breathing motion throughout the examination. Heart is nonenlarged. No pericardial effusion. Coronary artery calcifications are seen. The thoracic aorta has a overall normal caliber with some scattered atherosclerotic plaque. There is some pulsation artifact along the ascending aorta. There is some enlargement of the main pulmonary artery. Please correlate for pulmonary artery hypertension. There is a left lower lobe pulmonary embolism along segmental vessel. No other substantial pulmonary emboli are identified. Minimal clot burden. Mediastinum/Nodes: No specific abnormal lymph node enlargement identified in the axillary regions, hilum or mediastinum. Preserved thyroid gland. Enteric tube along the esophagus with tip extending into the upper stomach. Lungs/Pleura: Breathing motion. No consolidation, pneumothorax or effusion. Mild linear opacity at the bases likely scar or atelectasis. Musculoskeletal: Curvature and degenerative changes along the spine. Review of the MIP images confirms the above findings. CT ABDOMEN and PELVIS FINDINGS Hepatobiliary: Stable small hepatic benign-appearing cystic lesions. No specific imaging follow-up. Gallbladder is contracted. Patent portal vein. Pancreas: Mild global pancreatic atrophy.  No pancreatic mass. Spleen: Normal in  size without focal abnormality. Adrenals/Urinary Tract: The adrenal glands are preserved. 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 No specific imaging follow-up. No collecting system dilatation. The ureters have normal course and caliber extending down to the bladder. Bladder has preserved contour but a portion of the right side of the bladder extends into the right separate inguinal hernia. Stomach/Bowel: On this non oral contrast exam the stomach is nondilated. Small bowel is nondilated. Large bowel is dilated. Right-sided has dimensions approaching up to 10 cm in diameter. There are air-fluid levels. The transverse colon is slightly less dilated as is the descending colon with some scattered diverticula along the descending and sigmoid colon. The previous left inguinal hernia which contain only fat, now involves the sigmoid colon and there is a transition from the dilatation to the nondilated at the level of the inguinal hernia. Please correlate with reduced stability. There is a separate large right inguinal hernia which was seen previous involving mostly the bladder but also significant small bowel and mesenteric fat. Again the small bowel is nondilated. Vascular/Lymphatic: Aortic atherosclerosis. No enlarged abdominal or pelvic lymph nodes. Reproductive: Prostate is unremarkable. Other: Anasarca.  No free intra-abdominal air or free fluid. Musculoskeletal: Streak artifact related to the patient's bilateral dynamic hip screws. Degenerative changes of the spine and pelvis. Once again there are scattered lytic lesions along the pelvis and lumbar spine as previously described. Review of the MIP images confirms the above findings. Critical Value/emergent results were called by telephone at the time of interpretation on 07/29/2022 at 7:34 am to provider Vivien Rossetti , who verbally acknowledged these results. IMPRESSION: Left lower lobe segmental pulmonary embolism. Overall minimal clot burden.  Breathing motion. Dilated colon with air-fluid level seen and scattered colonic diverticula. Transition seen in the area of the new left inguinal hernia involving the sigmoid colon. Please correlate for reduce ability. No pneumatosis, free air or fluid. Small bowel  and stomach are nondilated but there is an enteric tube. Separate large known right inguinal hernia involving small bowel and significant portion of the urinary bladder. Enteric tube in the stomach. Lytic bone lesions consistent with known history of myeloma. Electronically Signed   By: Karen Kays M.D.   On: 07/29/2022 10:38   CT ABDOMEN PELVIS W CONTRAST  Result Date: 07/29/2022 CLINICAL DATA:  Dyspnea, shortness of breath. 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 EXAM: CT ANGIOGRAPHY CHEST CT ABDOMEN AND PELVIS WITH CONTRAST TECHNIQUE: Multidetector CT imaging of the chest was performed using the standard protocol during bolus administration of intravenous contrast. Multiplanar CT image reconstructions and MIPs were obtained to evaluate the vascular anatomy. Multidetector CT imaging of the abdomen and pelvis was performed using the standard protocol during bolus administration of intravenous contrast. RADIATION DOSE REDUCTION: This exam was performed according to the departmental dose-optimization program which includes automated exposure control, adjustment of the mA and/or kV according to patient size and/or use of iterative reconstruction technique. CONTRAST:  OMNIPAQUE IOHEXOL 350 MG/ML SOLN COMPARISON:  Standard CT with contrast 04/14/2022. Chest x-ray earlier 07/29/2018. PET-CT scan 05/20/2018 FINDINGS: CTA CHEST FINDINGS Cardiovascular: There is some mild breathing motion throughout the examination. Heart is nonenlarged. No pericardial effusion. Coronary artery calcifications are seen. The thoracic aorta has a overall normal caliber with some scattered atherosclerotic plaque. There is some pulsation artifact along  the ascending aorta. There is some enlargement of the main pulmonary artery. Please correlate for pulmonary artery hypertension. There is a left lower lobe pulmonary embolism along segmental vessel. No other substantial pulmonary emboli are identified. Minimal clot burden. Mediastinum/Nodes: No specific abnormal lymph node enlargement identified in the axillary regions, hilum or mediastinum. Preserved thyroid gland. Enteric tube along the esophagus with tip extending into the upper stomach. Lungs/Pleura: Breathing motion. No consolidation, pneumothorax or effusion. Mild linear opacity at the bases likely scar or atelectasis. Musculoskeletal: Curvature and degenerative changes along the spine. Review of the MIP images confirms the above findings. CT ABDOMEN and PELVIS FINDINGS Hepatobiliary: Stable small hepatic benign-appearing cystic lesions. No specific imaging follow-up. Gallbladder is contracted. Patent portal vein. Pancreas: Mild global pancreatic atrophy.  No pancreatic mass. Spleen: Normal in size without focal abnormality. Adrenals/Urinary Tract: The adrenal glands are preserved. 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 No specific imaging follow-up. No collecting system dilatation. The ureters have normal course and caliber extending down to the bladder. Bladder has preserved contour but a portion of the right side of the bladder extends into the right separate inguinal hernia. Stomach/Bowel: On this non oral contrast exam the stomach is nondilated. Small bowel is nondilated. Large bowel is dilated. Right-sided has dimensions approaching up to 10 cm in diameter. There are air-fluid levels. The transverse colon is slightly less dilated as is the descending colon with some scattered diverticula along the descending and sigmoid colon. The previous left inguinal hernia which contain only fat, now involves the sigmoid colon and there is a transition from the dilatation to the nondilated at the  level of the inguinal hernia. Please correlate with reduced stability. There is a separate large right inguinal hernia which was seen previous involving mostly the bladder but also significant small bowel and mesenteric fat. Again the small bowel is nondilated. Vascular/Lymphatic: Aortic atherosclerosis. No enlarged abdominal or pelvic lymph nodes. Reproductive: Prostate is unremarkable. Other: Anasarca.  No free intra-abdominal air or free fluid. Musculoskeletal: Streak artifact related to the patient's bilateral dynamic hip screws.  Degenerative changes of the spine and pelvis. Once again there are scattered lytic lesions along the pelvis and lumbar spine as previously described. Review of the MIP images confirms the above findings. Critical Value/emergent results were called by telephone at the time of interpretation on 07/29/2022 at 7:34 am to provider Vivien Rossetti , who verbally acknowledged these results. IMPRESSION: Left lower lobe segmental pulmonary embolism. Overall minimal clot burden. Breathing motion. Dilated colon with air-fluid level seen and scattered colonic diverticula. Transition seen in the area of the new left inguinal hernia involving the sigmoid colon. Please correlate for reduce ability. No pneumatosis, free air or fluid. Small bowel and stomach are nondilated but there is an enteric tube. Separate large known right inguinal hernia involving small bowel and significant portion of the urinary bladder. Enteric tube in the stomach. Lytic bone lesions consistent with known history of myeloma. Electronically Signed   By: Karen Kays M.D.   On: 07/29/2022 10:38   DG Abd 1 View  Result Date: 07/29/2022 CLINICAL DATA:  NG tube placement EXAM: ABDOMEN - 1 VIEW COMPARISON:  Same day CT abdomen/pelvis FINDINGS: The enteric catheter tip and sidehole project over the expected location of the stomach. There is marked gaseous distention of the large bowel as seen on same-day CT. There is no definite  free intraperitoneal air, within the confines of supine technique. IMPRESSION: Enteric catheter tip and sidehole project over the stomach. Electronically Signed   By: Lesia Hausen M.D.   On: 07/29/2022 10:34     PREVIOUS ENDOSCOPIES:            Colonoscopy Jul 23, 2015 (Dr. Myrtie Neither) Indication: Polyp surveillance 2 mm polyp in hepatic flexure, left-sided diverticulosis   Impression / Plan:   82 year old male with multiple myeloma, currently receiving treatment, admitted with respiratory symptoms, found to have new pulmonary embolism, also with abdominal distention for several days and several months of diarrhea with incontinence, found to have significantly dilated colon.  Initially, colonic dilation thought to be secondary to possible obstruction, possibly involving his large inguinal hernia.  However, he does not appear clinically obstructed and his hernia is reducible. 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  He certainly has risk factors for it given his malignancy and immobility.  However, he has continued to have rather profuse watery diarrhea, which is atypical for Ogilvie's. Given that his diarrhea started with his chemotherapy, I think this is the most likely explanation for his diarrhea, but this would not explain his colonic distention.  He has been taking frequent Imodium, which could potentially lead to some degree of colonic atony. I think a C. difficile infection is unlikely, given the negative toxin assay and the lack of significant abdominal pain or other symptoms, but if PCR test positive for C. difficile, then I think treating with oral vancomycin would be reasonable.  We could consider therapies for Ogilvie's to include neostigmine, but given his clinical improvement in his abdominal distention, and his good p.o. tolerance, I am reluctant to recommend that at this time.  I definitely would not consider endoscopic decompression at this time.  I  agree with advancing his diet as tolerated and encouraging out of bed activity.  Hopefully this will improve his colonic ileus.  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 - Can consider pharmacologic therapy for Ogilvie's to include neostigmine if patient does not show clinical improvement  Diarrhea, suspect related to chemotherapy - Await GI PCR pathogen panel/C. difficile PCR   Thanks   LOS: 2 days   Jenel Lucks  07/31/2022, 9:35 AM

## 2022-07-31 NOTE — Progress Notes (Signed)
Subjective/Chief Complaint: Patient denies nausea vomiting.  He does have bowel movements which are loose.   Objective: Vital signs in last 24 hours: Temp:  [97.3 F (36.3 C)-98.3 F (36.8 C)] 98.1 F (36.7 C) (06/01 0800) Pulse Rate:  [62-86] 63 (06/01 0700) Resp:  [14-23] 18 (06/01 0700) BP: (125-167)/(52-86) 129/69 (06/01 0600) SpO2:  [91 %-100 %] 100 % (06/01 0722) Last BM Date : 07/30/22  Intake/Output from previous day: 05/31 0701 - 06/01 0700 In: 1705.9 [I.V.:675; IV Piggyback:1030.9] Out: 1350 [Urine:1200; Stool:150] Intake/Output this shift: Total I/O In: 638.8 [I.V.:288.4; IV Piggyback:350.4] Out: -   Abdomen: Soft nondistended.  Reduced left inguinal hernia noted.  Chronic  right inguinal scrotal hernia noted without tenderness, redness.  Lab Results:  Recent Labs    07/30/22 0316 07/31/22 0301  WBC 6.3 5.0  HGB 9.0* 8.2*  HCT 27.9* 24.0*  PLT 230 212   BMET Recent Labs    07/30/22 1521 07/31/22 0301  NA 141 142  K 4.0 2.4*  CL 122* 120*  CO2 9* 12*  GLUCOSE 129* 89  BUN 13 12  CREATININE 1.39* 1.29*  CALCIUM 7.1* 6.7*   PT/INR No results for input(s): "LABPROT", "INR" in the last 72 hours. ABG Recent Labs    07/29/22 0852 07/30/22 2306  HCO3 12.2* 13.4*    Studies/Results: DG Chest Port 1 View  Result Date: 07/30/2022 CLINICAL DATA:  Shortness of breath. EXAM: PORTABLE CHEST 1 VIEW COMPARISON:  Chest radiograph dated Jul 29, 2022 FINDINGS: The heart size and mediastinal contours are within normal limits. Both lungs are clear. Elevation of the left hemidiaphragm with distended bowel loops in the left upper quadrant. The visualized skeletal structures are unremarkable. IMPRESSION: 1. No acute cardiopulmonary process 2. Elevation of the left hemidiaphragm with distended bowel loops in the left upper quadrant. Electronically Signed   By: Larose Hires D.O.   On: 07/30/2022 22:40   DG KNEE 3 VIEW RIGHT  Result Date: 07/30/2022 CLINICAL  DATA:  Right knee pain. EXAM: RIGHT KNEE - 3 VIEW COMPARISON:  Right femur radiographs 06/04/2022 FINDINGS: There is diffuse decreased bone mineralization. Severe patellofemoral joint space narrowing and peripheral osteophytosis. No joint effusion. Moderate posterior proximal tibial degenerative spurring. Moderate to severe medial joint space narrowing and peripheral osteophytosis. Minimal peripheral lateral chronic degenerative spurring without significant joint space narrowing. A calcified density measuring 6 mm again overlies the distal anterior lateral thigh soft tissues, nonspecific. No acute fracture is seen. No dislocation. Partial visualization of femoral intramedullary nail. 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 Electronically Signed   By: Neita Garnet M.D.   On: 07/30/2022 17:59   VAS [place] LOWER EXTREMITY VENOUS (DVT)  Result Date: 07/30/2022  Lower Venous DVT Study Patient Name:  Frank Hernandez  Date of Exam:   07/30/2022 Medical Rec #: [id number]            Accession #:    [id number] Date of Birth: 04-03-1940             Patient Gender: M Patient Age:   81 years Exam Location:  [facility] Procedure:      VAS [place] LOWER EXTREMITY VENOUS (DVT) Referring Phys: Jon Billings REGALADO --------------------------------------------------------------------------------  Indications: Edema.  Risk Factors: Confirmed PE. Anticoagulation: Heparin. Limitations: Body habitus, poor ultrasound/tissue interface and patient positioning. Comparison Study: No prior studies. Performing Technologist: Chanda Busing RVT  Examination Guidelines: A complete evaluation includes B-mode imaging, spectral Doppler, color Doppler, and power Doppler  as needed of all accessible portions of each vessel. Bilateral testing is considered an integral part of a complete examination. Limited examinations for reoccurring indications may be performed as noted. The reflux portion of the  exam is performed with the patient in reverse Trendelenburg.  +---------+---------------+---------+-----------+----------+-------------------+ RIGHT    CompressibilityPhasicitySpontaneityPropertiesThrombus Aging      +---------+---------------+---------+-----------+----------+-------------------+ CFV      Full           Yes      Yes                                      +---------+---------------+---------+-----------+----------+-------------------+ SFJ      Full                                                             +---------+---------------+---------+-----------+----------+-------------------+ FV Prox  Full                                                             +---------+---------------+---------+-----------+----------+-------------------+ FV Mid                  Yes      Yes                                      +---------+---------------+---------+-----------+----------+-------------------+ FV Distal               Yes      Yes                                      +---------+---------------+---------+-----------+----------+-------------------+ PFV      Full                                                             +---------+---------------+---------+-----------+----------+-------------------+ POP      Full           Yes      Yes                                      +---------+---------------+---------+-----------+----------+-------------------+ PTV      Full                                                             +---------+---------------+---------+-----------+----------+-------------------+ PERO  Not well visualized +---------+---------------+---------+-----------+----------+-------------------+   +---------+---------------+---------+-----------+----------+-------------------+ LEFT     CompressibilityPhasicitySpontaneityPropertiesThrombus Aging       +---------+---------------+---------+-----------+----------+-------------------+ CFV      Partial        Yes      Yes                  Acute               +---------+---------------+---------+-----------+----------+-------------------+ SFJ      Full                                                             +---------+---------------+---------+-----------+----------+-------------------+ FV Prox  Partial        Yes      No                   Acute               +---------+---------------+---------+-----------+----------+-------------------+ FV Mid   Partial        Yes      Yes                  Age Indeterminate   +---------+---------------+---------+-----------+----------+-------------------+ FV Distal                                             Not well visualized +---------+---------------+---------+-----------+----------+-------------------+ PFV      Full           Yes      Yes                                      +---------+---------------+---------+-----------+----------+-------------------+ POP      Partial        Yes      Yes                  Age Indeterminate   +---------+---------------+---------+-----------+----------+-------------------+ PTV      Full                                                             +---------+---------------+---------+-----------+----------+-------------------+ PERO                                                  Not well visualized +---------+---------------+---------+-----------+----------+-------------------+ Thrombus located in the common femoral vein is noted to be in the distal segment.    Summary: RIGHT: - There is no evidence of deep vein thrombosis in the lower extremity. However, portions of this examination were limited- see technologist comments above.  - No cystic structure found in the popliteal fossa.  LEFT: - Findings consistent with acute deep vein thrombosis involving the left common femoral vein,  and left femoral vein. - Findings consistent with age indeterminate deep vein thrombosis involving the left  popliteal vein. - No cystic structure found in the popliteal fossa.  *See table(s) above for measurements and observations. Electronically signed by Sherald Hess MD on 07/30/2022 at 4:42:31 PM.    Final    ECHOCARDIOGRAM COMPLETE  Result Date: 07/30/2022    ECHOCARDIOGRAM REPORT   Patient Name:   Frank Hernandez Date of Exam: 07/30/2022 Medical Rec #:  [id number]           Height:       69.0 in Accession #:    [id number]          Weight:       199.0 lb Date of Birth:  18-Nov-1940            BSA:          2.062 m Patient Age:    82 years            BP:           156/65 mmHg Patient Gender: M                   HR:           74 bpm. Exam Location:  Inpatient Procedure: 2D Echo, Color Doppler, Cardiac Doppler and Intracardiac            Opacification Agent Indications:    Pulmonary Embolus  History:        Patient has prior history of Echocardiogram examinations, most                 recent 04/22/2022. CKD, Signs/Symptoms:Syncope; Risk                 Factors:Hypertension.  Sonographer:    Milbert Coulter Referring Phys: [id number] DAVID MANUEL ORTIZ  Sonographer Comments: Technically difficult study due to poor echo windows, suboptimal parasternal window, suboptimal apical window and no subcostal window. Image acquisition challenging due to patient body habitus and Image acquisition challenging due to uncooperative patient. IMPRESSIONS  1. Left ventricular ejection fraction, by estimation, is 65 to 70%. The left ventricle has normal function. The left ventricle has no regional wall motion abnormalities. Left ventricular diastolic parameters are consistent with Grade I diastolic dysfunction (impaired relaxation).  2. Right ventricular systolic function is normal. The right ventricular size is normal.  3. The mitral valve is normal in structure. Trivial mitral valve regurgitation. No evidence of mitral stenosis.   4. The aortic valve is normal in structure. Aortic valve regurgitation is trivial. No aortic stenosis is present.  5. The inferior vena cava is normal in size with greater than 50% respiratory variability, suggesting right atrial pressure of 3 mmHg. FINDINGS  Left Ventricle: Left ventricular ejection fraction, by estimation, is 65 to 70%. The left ventricle has normal function. The left ventricle has no regional wall motion abnormalities. Definity contrast agent was given IV to delineate the left ventricular  endocardial borders. The left ventricular internal cavity size was normal in size. There is no left ventricular hypertrophy. Left ventricular diastolic parameters are consistent with Grade I diastolic dysfunction (impaired relaxation). Right Ventricle: The right ventricular size is normal. No increase in right ventricular wall thickness. Right ventricular systolic function is normal. Left Atrium: Left atrial size was normal in size. Right Atrium: Right atrial size was normal in size. Pericardium: There is no evidence of pericardial effusion. Mitral Valve: The mitral valve is normal in structure. Trivial mitral valve regurgitation. No evidence of mitral valve stenosis. Tricuspid Valve: The tricuspid valve is normal  in structure. Tricuspid valve regurgitation is mild . No evidence of tricuspid stenosis. Aortic Valve: The aortic valve is normal in structure. Aortic valve regurgitation is trivial. Aortic regurgitation PHT measures 434 msec. No aortic stenosis is present. Aortic valve mean gradient measures 6.0 mmHg. Aortic valve peak gradient measures 10.8 mmHg. Aortic valve area, by VTI measures 2.35 cm. Pulmonic Valve: The pulmonic valve was normal in structure. Pulmonic valve regurgitation is not visualized. No evidence of pulmonic stenosis. Aorta: The aortic root is normal in size and structure. Venous: The inferior vena cava is normal in size with greater than 50% respiratory variability, suggesting right  atrial pressure of 3 mmHg. IAS/Shunts: No atrial level shunt detected by color flow Doppler.  LEFT VENTRICLE PLAX 2D LVIDd:         3.30 cm LVIDs:         2.60 cm LV PW:         1.50 cm LV IVS:        1.60 cm LVOT diam:     2.20 cm LV SV:         64 LV SV Index:   31 LVOT Area:     3.80 cm  RIGHT VENTRICLE TAPSE (M-mode): 1.8 cm AORTIC VALVE AV Area (Vmax):    2.53 cm AV Area (Vmean):   2.59 cm AV Area (VTI):     2.35 cm AV Vmax:           164.00 cm/s AV Vmean:          107.000 cm/s AV VTI:            0.271 m AV Peak Grad:      10.8 mmHg AV Mean Grad:      6.0 mmHg LVOT Vmax:         109.20 cm/s LVOT Vmean:        72.850 cm/s LVOT VTI:          0.167 m LVOT/AV VTI ratio: 0.62 AI PHT:            434 msec  AORTA Ao Root diam: 4.30 cm TRICUSPID VALVE TR Peak grad:   42.0 mmHg TR Vmax:        324.00 cm/s  SHUNTS Systemic VTI:  0.17 m Systemic Diam: 2.20 cm Chilton Si MD Electronically signed by Chilton Si MD Signature Date/Time: 07/30/2022/3:47:49 PM    Final    DG Abd 1 View  Result Date: 07/30/2022 CLINICAL DATA:  Small bowel obstruction. EXAM: ABDOMEN - 1 VIEW COMPARISON:  Jul 29, 2022. FINDINGS: Stable marked gaseous distention of colon is noted. No definite small bowel dilatation is noted. Distal tip of nasogastric tube is seen in expected position of proximal stomach. IMPRESSION: Stable gaseous distention of colon most consistent with ileus or distal colonic obstruction. Electronically Signed   By: Lupita Raider M.D.   On: 07/30/2022 09:04   DG Ankle Complete Right  Result Date: 07/29/2022 CLINICAL DATA:  Ankle wound. EXAM: RIGHT ANKLE - COMPLETE 3+ VIEW COMPARISON:  None Available. FINDINGS: There is no evidence of fracture, dislocation, or joint effusion. There is no evidence of arthropathy or other focal bone abnormality. Vascular calcifications are noted. No definite lytic destruction is noted. No definite soft tissue wound is noted. IMPRESSION: No acute abnormality. Electronically  Signed   By: Lupita Raider M.D.   On: 07/29/2022 10:39   CT Angio Chest PE W and/or Wo Contrast  Result Date: 07/29/2022 CLINICAL DATA:  Dyspnea, shortness  of breath. 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 EXAM: CT ANGIOGRAPHY CHEST CT ABDOMEN AND PELVIS WITH CONTRAST TECHNIQUE: Multidetector CT imaging of the chest was performed using the standard protocol during bolus administration of intravenous contrast. Multiplanar CT image reconstructions and MIPs were obtained to evaluate the vascular anatomy. Multidetector CT imaging of the abdomen and pelvis was performed using the standard protocol during bolus administration of intravenous contrast. RADIATION DOSE REDUCTION: This exam was performed according to the departmental dose-optimization program which includes automated exposure control, adjustment of the mA and/or kV according to patient size and/or use of iterative reconstruction technique. CONTRAST:  OMNIPAQUE IOHEXOL 350 MG/ML SOLN COMPARISON:  Standard CT with contrast 04/14/2022. Chest x-ray earlier 07/29/2018. PET-CT scan 05/20/2018 FINDINGS: CTA CHEST FINDINGS Cardiovascular: There is some mild breathing motion throughout the examination. Heart is nonenlarged. No pericardial effusion. Coronary artery calcifications are seen. The thoracic aorta has a overall normal caliber with some scattered atherosclerotic plaque. There is some pulsation artifact along the ascending aorta. There is some enlargement of the main pulmonary artery. Please correlate for pulmonary artery hypertension. There is a left lower lobe pulmonary embolism along segmental vessel. No other substantial pulmonary emboli are identified. Minimal clot burden. Mediastinum/Nodes: No specific abnormal lymph node enlargement identified in the axillary regions, hilum or mediastinum. Preserved thyroid gland. Enteric tube along the esophagus with tip extending into the upper stomach. Lungs/Pleura: Breathing motion.  No consolidation, pneumothorax or effusion. Mild linear opacity at the bases likely scar or atelectasis. Musculoskeletal: Curvature and degenerative changes along the spine. Review of the MIP images confirms the above findings. CT ABDOMEN and PELVIS FINDINGS Hepatobiliary: Stable small hepatic benign-appearing cystic lesions. No specific imaging follow-up. Gallbladder is contracted. Patent portal vein. Pancreas: Mild global pancreatic atrophy.  No pancreatic mass. Spleen: Normal in size without focal abnormality. Adrenals/Urinary Tract: The adrenal glands are preserved. 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 No specific imaging follow-up. No collecting system dilatation. The ureters have normal course and caliber extending down to the bladder. Bladder has preserved contour but a portion of the right side of the bladder extends into the right separate inguinal hernia. Stomach/Bowel: On this non oral contrast exam the stomach is nondilated. Small bowel is nondilated. Large bowel is dilated. Right-sided has dimensions approaching up to 10 cm in diameter. There are air-fluid levels. The transverse colon is slightly less dilated as is the descending colon with some scattered diverticula along the descending and sigmoid colon. The previous left inguinal hernia which contain only fat, now involves the sigmoid colon and there is a transition from the dilatation to the nondilated at the level of the inguinal hernia. Please correlate with reduced stability. There is a separate large right inguinal hernia which was seen previous involving mostly the bladder but also significant small bowel and mesenteric fat. Again the small bowel is nondilated. Vascular/Lymphatic: Aortic atherosclerosis. No enlarged abdominal or pelvic lymph nodes. Reproductive: Prostate is unremarkable. Other: Anasarca.  No free intra-abdominal air or free fluid. Musculoskeletal: Streak artifact related to the patient's bilateral dynamic  hip screws. Degenerative changes of the spine and pelvis. Once again there are scattered lytic lesions along the pelvis and lumbar spine as previously described. Review of the MIP images confirms the above findings. Critical Value/emergent results were called by telephone at the time of interpretation on 07/29/2022 at 7:34 am to provider Vivien Rossetti , who verbally acknowledged these results. IMPRESSION: Left lower lobe segmental pulmonary embolism. Overall minimal clot burden.  Breathing motion. Dilated colon with air-fluid level seen and scattered colonic diverticula. Transition seen in the area of the new left inguinal hernia involving the sigmoid colon. Please correlate for reduce ability. No pneumatosis, free air or fluid. Small bowel and stomach are nondilated but there is an enteric tube. Separate large known right inguinal hernia involving small bowel and significant portion of the urinary bladder. Enteric tube in the stomach. Lytic bone lesions consistent with known history of myeloma. Electronically Signed   By: Karen Kays M.D.   On: 07/29/2022 10:38   CT ABDOMEN PELVIS W CONTRAST  Result Date: 07/29/2022 CLINICAL DATA:  Dyspnea, shortness of breath. 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 EXAM: CT ANGIOGRAPHY CHEST CT ABDOMEN AND PELVIS WITH CONTRAST TECHNIQUE: Multidetector CT imaging of the chest was performed using the standard protocol during bolus administration of intravenous contrast. Multiplanar CT image reconstructions and MIPs were obtained to evaluate the vascular anatomy. Multidetector CT imaging of the abdomen and pelvis was performed using the standard protocol during bolus administration of intravenous contrast. RADIATION DOSE REDUCTION: This exam was performed according to the departmental dose-optimization program which includes automated exposure control, adjustment of the mA and/or kV according to patient size and/or use of iterative reconstruction  technique. CONTRAST:  OMNIPAQUE IOHEXOL 350 MG/ML SOLN COMPARISON:  Standard CT with contrast 04/14/2022. Chest x-ray earlier 07/29/2018. PET-CT scan 05/20/2018 FINDINGS: CTA CHEST FINDINGS Cardiovascular: There is some mild breathing motion throughout the examination. Heart is nonenlarged. No pericardial effusion. Coronary artery calcifications are seen. The thoracic aorta has a overall normal caliber with some scattered atherosclerotic plaque. There is some pulsation artifact along the ascending aorta. There is some enlargement of the main pulmonary artery. Please correlate for pulmonary artery hypertension. There is a left lower lobe pulmonary embolism along segmental vessel. No other substantial pulmonary emboli are identified. Minimal clot burden. Mediastinum/Nodes: No specific abnormal lymph node enlargement identified in the axillary regions, hilum or mediastinum. Preserved thyroid gland. Enteric tube along the esophagus with tip extending into the upper stomach. Lungs/Pleura: Breathing motion. No consolidation, pneumothorax or effusion. Mild linear opacity at the bases likely scar or atelectasis. Musculoskeletal: Curvature and degenerative changes along the spine. Review of the MIP images confirms the above findings. CT ABDOMEN and PELVIS FINDINGS Hepatobiliary: Stable small hepatic benign-appearing cystic lesions. No specific imaging follow-up. Gallbladder is contracted. Patent portal vein. Pancreas: Mild global pancreatic atrophy.  No pancreatic mass. Spleen: Normal in size without focal abnormality. Adrenals/Urinary Tract: The adrenal glands are preserved. 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 No specific imaging follow-up. No collecting system dilatation. The ureters have normal course and caliber extending down to the bladder. Bladder has preserved contour but a portion of the right side of the bladder extends into the right separate inguinal hernia. Stomach/Bowel: On this non  oral contrast exam the stomach is nondilated. Small bowel is nondilated. Large bowel is dilated. Right-sided has dimensions approaching up to 10 cm in diameter. There are air-fluid levels. The transverse colon is slightly less dilated as is the descending colon with some scattered diverticula along the descending and sigmoid colon. The previous left inguinal hernia which contain only fat, now involves the sigmoid colon and there is a transition from the dilatation to the nondilated at the level of the inguinal hernia. Please correlate with reduced stability. There is a separate large right inguinal hernia which was seen previous involving mostly the bladder but also significant small bowel and mesenteric fat. Again  the small bowel is nondilated. Vascular/Lymphatic: Aortic atherosclerosis. No enlarged abdominal or pelvic lymph nodes. Reproductive: Prostate is unremarkable. Other: Anasarca.  No free intra-abdominal air or free fluid. Musculoskeletal: Streak artifact related to the patient's bilateral dynamic hip screws. Degenerative changes of the spine and pelvis. Once again there are scattered lytic lesions along the pelvis and lumbar spine as previously described. Review of the MIP images confirms the above findings. Critical Value/emergent results were called by telephone at the time of interpretation on 07/29/2022 at 7:34 am to provider Vivien Rossetti , who verbally acknowledged these results. IMPRESSION: Left lower lobe segmental pulmonary embolism. Overall minimal clot burden. Breathing motion. Dilated colon with air-fluid level seen and scattered colonic diverticula. Transition seen in the area of the new left inguinal hernia involving the sigmoid colon. Please correlate for reduce ability. No pneumatosis, free air or fluid. Small bowel and stomach are nondilated but there is an enteric tube. Separate large known right inguinal hernia involving small bowel and significant portion of the urinary bladder.  Enteric tube in the stomach. Lytic bone lesions consistent with known history of myeloma. Electronically Signed   By: Karen Kays M.D.   On: 07/29/2022 10:38   DG Abd 1 View  Result Date: 07/29/2022 CLINICAL DATA:  NG tube placement EXAM: ABDOMEN - 1 VIEW COMPARISON:  Same day CT abdomen/pelvis FINDINGS: The enteric catheter tip and sidehole project over the expected location of the stomach. There is marked gaseous distention of the large bowel as seen on same-day CT. There is no definite free intraperitoneal air, within the confines of supine technique. IMPRESSION: Enteric catheter tip and sidehole project over the stomach. Electronically Signed   By: Lesia Hausen M.D.   On: 07/29/2022 10:34    Anti-infectives: Anti-infectives (From admission, onward)    Start     Dose/Rate Route Frequency Ordered Stop   07/31/22 1000  vancomycin (VANCOREADY) IVPB 1500 mg/300 mL  Status:  Discontinued        1,500 mg 150 mL/hr over 120 Minutes Intravenous Every 48 hours 07/29/22 1243 07/30/22 0755   07/30/22 2200  acyclovir (ZOVIRAX) tablet 400 mg        400 mg Oral Daily at bedtime 07/30/22 1843     07/30/22 1400  ceFAZolin (ANCEF) IVPB 2g/100 mL premix        2 g 200 mL/hr over 30 Minutes Intravenous Every 8 hours 07/30/22 0843     07/30/22 0900  doxycycline (VIBRAMYCIN) 100 mg in sodium chloride 0.9 % 250 mL IVPB        100 mg 125 mL/hr over 120 Minutes Intravenous Every 12 hours 07/30/22 0809     07/29/22 1800  ceFEPIme (MAXIPIME) 2 g in sodium chloride 0.9 % 100 mL IVPB  Status:  Discontinued        2 g 200 mL/hr over 30 Minutes Intravenous Every 12 hours 07/29/22 1234 07/30/22 0841   07/29/22 1230  metroNIDAZOLE (FLAGYL) IVPB 500 mg  Status:  Discontinued        500 mg 100 mL/hr over 60 Minutes Intravenous Every 12 hours 07/29/22 1205 07/30/22 0909   07/29/22 0930  piperacillin-tazobactam (ZOSYN) IVPB 3.375 g        3.375 g 100 mL/hr over 30 Minutes Intravenous  Once 07/29/22 0920 07/29/22 1038    07/29/22 0930  vancomycin (VANCOREADY) IVPB 2000 mg/400 mL        2,000 mg 200 mL/hr over 120 Minutes Intravenous  Once 07/29/22 0923 07/29/22 1246  Assessment/Plan:    LOS: 2 days   82 y/o male with MMP including but not limited to 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 He has bilateral inguinal hernias that were both present on a previous CT scan.    Patient clinically is not obstructed and having multiple bowel movements with less abdominal distention. Overall status is quite poor given metastatic cancer, and extremely poor health. He does not require repair of his hernias at this point in time and it is unclear if this is actually causing any sort of obstruction. These are chronic. Okay to anticoagulate for now. We will follow along but do not recommend operative intervention due to extremely poor health and the fact that he is not acting obstructed at this point in time   [facility]   Dortha Schwalbe MD  07/31/2022 MODERATE  COMPLEXITY

## 2022-07-31 NOTE — Evaluation (Signed)
Physical Therapy Evaluation Patient Details Name: Frank Hernandez MRN: [id number] DOB: June 10, 1940 Today's Date: 07/31/2022  History of Present Illness  82 year old gentleman history of remote bladder cancer, pt underwent IM nail R femur d/t pathological fx +Bx on 04/17/22 hospitalized diagnosed with multiple myeloma, CKD, hypertension recently started on treatment for multiple myeloma and admitted 07/29/22 with respiratory symptoms, found to have new pulmonary embolism, also with abdominal distention for several days and several months of diarrhea with incontinence, found to have significantly dilated colon (per GI, Colonic distention, 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  Pt currently with functional limitations due to the deficits listed below (see PT Problem List). Pt will benefit from acute skilled PT to increase their independence and safety with mobility to allow discharge.  Pt pleasant and agreeable to be OOB today.  Pt assisted with bed mobility and transfer OOB to recliner.  Pt currently requiring moderate to maximum assist (+2 for safety) to mobilize.  Pt from home with spouse and was receiving HHPT prior to this admission.  Pt reports poor experience with previous SNF stay and plans to return home upon d/c. Pt would benefit from maximizing home services upon d/c.        Recommendations for follow up therapy are one component of a multi-disciplinary discharge planning process, led by the attending physician.  Recommendations may be updated based on patient status, additional functional criteria and insurance authorization.  Follow Up Recommendations Can patient physically be transported by private vehicle: No     Assistance Recommended at Discharge Frequent or constant Supervision/Assistance  Patient can return home with the following  A lot of help with walking and/or transfers;A lot of help with  bathing/dressing/bathroom;Assistance with cooking/housework;Assist for transportation;Help with stairs or ramp for entrance    Equipment Recommendations None recommended by PT  Recommendations for Other Services       Functional Status Assessment Patient has had a recent decline in their functional status and demonstrates the ability to make significant improvements in function in a reasonable and predictable amount of time.     Precautions / Restrictions Precautions Precautions: Fall Precaution Comments: incontinent B/B per RN, currently with male wicking system and rectal bag      Mobility  Bed Mobility Overal bed mobility: Needs Assistance Bed Mobility: Rolling, Sidelying to Sit Rolling: Mod assist, Max assist, +2 for physical assistance Sidelying to sit: Mod assist, Max assist, +2 for physical assistance       General bed mobility comments: assist to bend knees and reach for bed rail for rolling and completing roll, assist for LEs over EOB and trunk upright (log roll for peri area comfort)    Transfers Overall transfer level: Needs assistance Equipment used: Rolling walker (2 wheels) Transfers: Sit to/from Stand, Bed to chair/wheelchair/BSC Sit to Stand: Mod assist, +2 physical assistance Stand pivot transfers: Min assist, +2 safety/equipment         General transfer comment: multimodal cues for hand placement, weight shifting and assist to rise and steady, posterior bias present and pt required time to correct; pt unable to march in place but able to pivot feet so started with pivoting and then pt able to take small steps to recliner (recliner positioned close to bed for safety)    Ambulation/Gait                  Careers information officer  Modified Rankin (Stroke Patients Only)       Balance Overall balance assessment: Needs assistance Sitting-balance support: Feet supported, No upper extremity supported Sitting balance-Leahy  Scale: Fair     Standing balance support: Bilateral upper extremity supported, Reliant on assistive device for balance, During functional activity Standing balance-Leahy Scale: Poor                               Pertinent Vitals/Pain Pain Assessment Pain Assessment: No/denies pain (none at rest, grimacing with some movements due to rectal tube)    Home Living Family/patient expects to be discharged to:: Private residence Living Arrangements: Spouse/significant other Available Help at Discharge: Family;Available 24 hours/day Type of Home: House Home Access: Ramped entrance       Home Layout: One level Home Equipment: Agricultural consultant (2 wheels);Rollator (4 wheels);Wheelchair - manual;BSC/3in1      Prior Function Prior Level of Function : Needs assist             Mobility Comments: has had recent previous admission, currently from home and plans to return home (bad SNF experience per pt), ambulatory with walker and was receiving HHPT       Hand Dominance        Extremity/Trunk Assessment   Upper Extremity Assessment Upper Extremity Assessment: RUE deficits/detail RUE Deficits / Details: observed R UE swelling and RN aware    Lower Extremity Assessment Lower Extremity Assessment: Generalized weakness    Cervical / Trunk Assessment Cervical / Trunk Assessment: Kyphotic  Communication   Communication: No difficulties  Cognition Arousal/Alertness: Awake/alert Behavior During Therapy: WFL for tasks assessed/performed Overall Cognitive Status: Within Functional Limits for tasks assessed                                          General Comments      Exercises     Assessment/Plan    PT Assessment Patient needs continued PT services  PT Problem List Decreased strength;Decreased activity tolerance;Decreased balance;Decreased mobility       PT Treatment Interventions Gait training;DME instruction;Therapeutic exercise;Balance  training;Functional mobility training;Therapeutic activities;Patient/family education    PT Goals (Current goals can be found in the Care Plan section)  Acute Rehab PT Goals PT Goal Formulation: With patient Time For Goal Achievement: 08/14/22 Potential to Achieve Goals: Good    Frequency Min 1X/week     Co-evaluation               AM-PAC PT "6 Clicks" Mobility  Outcome Measure Help needed turning from your back to your side while in a flat bed without using bedrails?: Total Help needed moving from lying on your back to sitting on the side of a flat bed without using bedrails?: Total Help needed moving to and from a bed to a chair (including a wheelchair)?: A Lot Help needed standing up from a chair using your arms (e.g., wheelchair or bedside chair)?: A Lot Help needed to walk in hospital room?: Total Help needed climbing 3-5 steps with a railing? : Total 6 Click Score: 8    End of Session Equipment Utilized During Treatment: Gait belt Activity Tolerance: Patient tolerated treatment well Patient left: in chair;with call bell/phone within reach;with chair alarm set;with nursing/sitter in room Nurse Communication: Mobility status PT Visit Diagnosis: Difficulty in walking, not elsewhere classified (R26.2);Muscle weakness (generalized) (M62.81)  Time: 1610-9604 PT Time Calculation (min) (ACUTE ONLY): 25 min   Charges:   PT Evaluation $PT Eval Moderate Complexity: 1 Mod PT Treatments $Therapeutic Activity: 8-22 mins       Paulino Door, DPT Physical Therapist Acute Rehabilitation Services Office: [phone]   Kati L Payson 07/31/2022, 1:01 PM

## 2022-07-31 NOTE — Evaluation (Signed)
Occupational Therapy Evaluation Patient Details Name: Frank Hernandez MRN: [id number] DOB: 1940/12/14 Today's Date: 07/31/2022   History of Present Illness 82 year old gentleman history of remote bladder cancer, pt underwent IM nail R femur d/t pathological fx +Bx on 04/17/22 hospitalized diagnosed with multiple myeloma, CKD, hypertension recently started on treatment for multiple myeloma and admitted 07/29/22 with respiratory symptoms, found to have new pulmonary embolism, also with abdominal distention for several days and several months of diarrhea with incontinence, found to have significantly dilated colon (per GI, Colonic distention, 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 He currently requires increased assist for tasks, such as lower body dressing, toileting, and bed mobility. Without further OT services, he is at risk for restricted ADL participation, as well as further deconditioning and weakness.  He desires to return home with resumption of home therapy when discharged from the hospital; his spouse appeared receptive to alternative post-acute therapy services if needed, particularly short-term SNF rehab.      Recommendations for follow up therapy are one component of a multi-disciplinary discharge planning process, led by the attending physician.  Recommendations may be updated based on patient status, additional functional criteria and insurance authorization.   Assistance Recommended at Discharge Frequent or constant Supervision/Assistance  Patient can return home with the following A lot of help with bathing/dressing/bathroom;Assistance with cooking/housework;Direct supervision/assist for medications management;Assist for transportation;Help with stairs or ramp for  entrance;A lot of help with walking and/or transfers    Functional Status Assessment  Patient has had a recent decline in their functional status and demonstrates the ability to make significant improvements in function in a reasonable and predictable amount of time.  Equipment Recommendations  None recommended by OT       Precautions / Restrictions Precautions Precautions: Fall Restrictions Weight Bearing Restrictions: No Other Position/Activity Restrictions: enteric precautions      Mobility Bed Mobility Overal bed mobility: Needs Assistance Bed Mobility: Rolling Rolling: Mod assist              Tra        ADL either performed or assessed with clinical judgement   ADL Overall ADL's : Needs assistance/impaired Eating/Feeding: Set up;Bed level   Grooming: Minimal assistance;Bed level Grooming Details (indicate cue type and reason): simulated         Upper Body Dressing : Minimal assistance Upper Body Dressing Details (indicate cue type and reason): simulated at bed level Lower Body Dressing: Maximal assistance;Bed level       Toileting- Clothing Manipulation and Hygiene: Maximal assistance;Bed level               Vision   Additional Comments: he correctly read the time on the wall clock            Pertinent Vitals/Pain Pain Assessment Pain Assessment: 0-10 Pain Score: 8  Pain Location: R heel/foot Pain Intervention(s): Monitored during session, Limited activity within patient's tolerance, Other (comment) (He reported receiving pain medication.)     Hand Dominance Right   Extremity/Trunk Assessment Upper Extremity Assessment Upper Extremity Assessment:  (BUE AROM WFL. Generalized weakness. BUE grip strength 4-/5)   Lower Extremity Assessment Lower Extremity Assessment: Generalized weakness       Communication Communication Communication: No difficulties   Cognition Arousal/Alertness: Awake/alert Behavior During Therapy: WFL for  tasks assessed/performed Overall Cognitive Status: Within Functional  Limits for tasks assessed                           Home Living Family/patient expects to be discharged to:: Unsure Living Arrangements: Spouse/significant other Available Help at Discharge: Family Type of Home: House Home Access: Ramped entrance     Home Layout: One level               Home Equipment: Agricultural consultant (2 wheels);BSC/3in1;Wheelchair - manual   Additional Comments: Pt sleeps in his Lift recliner.  BSC atop toilet.      Prior Functioning/Environment Prior Level of Function : Needs assist             Mobility Comments:  (Pt used a RW to ambulate household distances. He was receiving HH PT, OT and nursing) ADLs Comments:  (He could feed himself, his spouse assisted with bathing (spongebaths), he could perform toileting without assist, and he required min assist for lower body dressing. She spouse managed cooking and cleaning.)        OT Problem List: Decreased strength;Decreased activity tolerance;Impaired balance (sitting and/or standing);Pain;Increased edema      OT Treatment/Interventions: Self-care/ADL training;Therapeutic exercise;Energy conservation;DME and/or AE instruction;Therapeutic activities;Patient/family education;Balance training    OT Goals(Current goals can be found in the care plan section) Acute Rehab OT Goals Patient Stated Goal: to return home at discharge OT Goal Formulation: With patient Time For Goal Achievement: 08/14/22 Potential to Achieve Goals: Good ADL Goals Pt Will Perform Grooming: with set-up;sitting Pt Will Perform Upper Body Dressing: with set-up;sitting Pt Will Perform Lower Body Dressing: with min guard assist;sit to/from stand Pt Will Transfer to Toilet: with min guard assist;ambulating Pt Will Perform Toileting - Clothing Manipulation and hygiene: with min guard assist;sit to/from stand  OT Frequency: Min 1X/week       AM-PAC OT "6  Clicks" Daily Activity     Outcome Measure Help from another person eating meals?: None Help from another person taking care of personal grooming?: A Little Help from another person toileting, which includes using toliet, bedpan, or urinal?: A Lot Help from another person bathing (including washing, rinsing, drying)?: A Lot Help from another person to put on and taking off regular upper body clothing?: A Little Help from another person to put on and taking off regular lower body clothing?: A Lot 6 Click Score: 16   End of Session Nurse Communication: Mobility status  Activity Tolerance: Other (comment) (Fair tolerance) Patient left: in bed;with call bell/phone within reach;with bed alarm set;with family/visitor present  OT Visit Diagnosis: Muscle weakness (generalized) (M62.81);Pain Pain - part of body:  (R heel/foot)                Time: 1710-1726 OT Time Calculation (min): 16 min Charges:  OT General Charges $OT Visit: 1 Visit OT Evaluation $OT Eval Moderate Complexity: 1 Mod    Francoise Chojnowski L Zakaria Sedor, OTR/L 07/31/2022, 5:38 PM

## 2022-08-01 DIAGNOSIS — C9000 Multiple myeloma not having achieved remission: Secondary | ICD-10-CM

## 2022-08-01 DIAGNOSIS — I2699 Other pulmonary embolism without acute cor pulmonale: Secondary | ICD-10-CM | POA: Diagnosis not present

## 2022-08-01 DIAGNOSIS — K5981 Ogilvie syndrome: Secondary | ICD-10-CM

## 2022-08-01 DIAGNOSIS — K6389 Other specified diseases of intestine: Secondary | ICD-10-CM | POA: Diagnosis not present

## 2022-08-01 DIAGNOSIS — R197 Diarrhea, unspecified: Secondary | ICD-10-CM | POA: Diagnosis not present

## 2022-08-01 LAB — COMPREHENSIVE METABOLIC PANEL
ALT: 9 U/L (ref 0–44)
AST: 22 U/L (ref 15–41)
Albumin: 2.5 g/dL — ABNORMAL LOW (ref 3.5–5.0)
Alkaline Phosphatase: 68 U/L (ref 38–126)
Anion gap: 9 (ref 5–15)
BUN: 9 mg/dL (ref 8–23)
CO2: 12 mmol/L — ABNORMAL LOW (ref 22–32)
Calcium: 6.8 mg/dL — ABNORMAL LOW (ref 8.9–10.3)
Chloride: 122 mmol/L — ABNORMAL HIGH (ref 98–111)
Creatinine, Ser: 1.11 mg/dL (ref 0.61–1.24)
GFR, Estimated: >60 mL/min (ref >60)
Glucose, Bld: 90 mg/dL (ref 70–99)
Potassium: 2.6 mmol/L — CL (ref 3.5–5.1)
Sodium: 143 mmol/L (ref 135–145)
Total Bilirubin: 0.8 mg/dL (ref 0.3–1.2)
Total Protein: 4.7 g/dL — ABNORMAL LOW (ref 6.5–8.1)

## 2022-08-01 LAB — GLUCOSE, CAPILLARY
Glucose-Capillary: 96 mg/dL (ref 70–99)
Glucose-Capillary: 102 mg/dL — ABNORMAL HIGH (ref 70–99)
Glucose-Capillary: 95 mg/dL (ref 70–99)
Glucose-Capillary: 133 mg/dL — ABNORMAL HIGH (ref 70–99)
Glucose-Capillary: 115 mg/dL — ABNORMAL HIGH (ref 70–99)

## 2022-08-01 LAB — BASIC METABOLIC PANEL
Anion gap: 11 (ref 5–15)
BUN: 7 mg/dL — ABNORMAL LOW (ref 8–23)
CO2: 11 mmol/L — ABNORMAL LOW (ref 22–32)
Calcium: 6.9 mg/dL — ABNORMAL LOW (ref 8.9–10.3)
Chloride: 120 mmol/L — ABNORMAL HIGH (ref 98–111)
Creatinine, Ser: 1.07 mg/dL (ref 0.61–1.24)
GFR, Estimated: >60 mL/min (ref >60)
Glucose, Bld: 116 mg/dL — ABNORMAL HIGH (ref 70–99)
Potassium: 3.3 mmol/L — ABNORMAL LOW (ref 3.5–5.1)
Sodium: 142 mmol/L (ref 135–145)

## 2022-08-01 LAB — CBC
HCT: 23.9 % — ABNORMAL LOW (ref 39.0–52.0)
Hemoglobin: 8 g/dL — ABNORMAL LOW (ref 13.0–17.0)
MCH: 31.3 pg (ref 26.0–34.0)
MCHC: 33.5 g/dL (ref 30.0–36.0)
MCV: 93.4 fL (ref 80.0–100.0)
Platelets: 213 10*3/uL (ref 150–400)
RBC: 2.56 MIL/uL — ABNORMAL LOW (ref 4.22–5.81)
RDW: 18.3 % — ABNORMAL HIGH (ref 11.5–15.5)
WBC: 3.9 10*3/uL — ABNORMAL LOW (ref 4.0–10.5)
nRBC: 0 % (ref 0.0–0.2)

## 2022-08-01 LAB — OVA + PARASITE EXAM

## 2022-08-01 LAB — MAGNESIUM: Magnesium: 1.7 mg/dL (ref 1.7–2.4)

## 2022-08-01 LAB — CULTURE, BLOOD (ROUTINE X 2): Special Requests: ADEQUATE

## 2022-08-01 MED ORDER — POTASSIUM CHLORIDE 10 MEQ/100ML IV SOLN
10.0000 meq | INTRAVENOUS | Status: AC
  Administered 2022-08-01 (×5): 10 meq via INTRAVENOUS
  Filled 2022-08-01 (×5): qty 100

## 2022-08-01 MED ORDER — POTASSIUM CHLORIDE CRYS ER 20 MEQ PO TBCR
40.0000 meq | EXTENDED_RELEASE_TABLET | Freq: Once | ORAL | Status: AC
  Administered 2022-08-01: 40 meq via ORAL
  Filled 2022-08-01: qty 2

## 2022-08-01 MED ORDER — POTASSIUM CHLORIDE 10 MEQ/100ML IV SOLN
10.0000 meq | INTRAVENOUS | Status: AC
  Administered 2022-08-01 (×5): 10 meq via INTRAVENOUS
  Filled 2022-08-01 (×4): qty 100

## 2022-08-01 MED ORDER — CEFAZOLIN SODIUM-DEXTROSE 2-4 GM/100ML-% IV SOLN
2.0000 g | Freq: Three times a day (TID) | INTRAVENOUS | Status: AC
  Administered 2022-08-01 – 2022-08-03 (×6): 2 g via INTRAVENOUS
  Filled 2022-08-01 (×6): qty 100

## 2022-08-01 MED ORDER — MAGNESIUM SULFATE 2 GM/50ML IV SOLN
2.0000 g | Freq: Once | INTRAVENOUS | Status: AC
  Administered 2022-08-01: 2 g via INTRAVENOUS
  Filled 2022-08-01: qty 50

## 2022-08-01 NOTE — Progress Notes (Signed)
[facility] ROUNDING NOTE   Subjective: Patient reports he is feeling well today.  He denies any abdominal pain or discomfort.  He tells me that he had chicken for breakfast this morning and vomited it up, but the patient's nurse states that the patient did not order breakfast, and that he had some choking with taking his pills with applesauce, but otherwise has not had any emesis. GI PCR panel notable for presence of E. coli, and he was started on azithromycin. He continues to pass large amounts of liquid stool into his FMS, and continues to have issues with hypokalemia.   Objective: Vital signs in last 24 hours: Temp:  [97.5 F (36.4 C)-98.5 F (36.9 C)] 97.9 F (36.6 C) (06/02 0807) Pulse Rate:  [57-77] 66 (06/02 0500) Resp:  [13-21] 14 (06/02 0500) BP: (131-154)/(49-74) 146/63 (06/02 0500) SpO2:  [90 %-100 %] 100 % (06/02 0832) Last BM Date : 07/31/22 General: NAD Lungs:  CTA b/l, no w/r/r Heart:  RRR, no m/r/g Abdomen:  Soft, NT, ND, hyperactive, hollow/tinkling bowel sounds Ext:  No c/c/e    Intake/Output from previous day: 06/01 0701 - 06/02 0700 In: 2172.2 [P.O.:480; I.V.:766.5; IV Piggyback:925.7] Out: 3690 [Urine:3000; Stool:690] Intake/Output this shift: No intake/output data recorded.   Lab Results: Recent Labs    07/30/22 0316 07/31/22 0301 08/01/22 0304  WBC 6.3 5.0 3.9*  HGB 9.0* 8.2* 8.0*  PLT 230 212 213  MCV 99.3 94.5 93.4   BMET Recent Labs    07/31/22 0301 07/31/22 1901 08/01/22 0304  NA 142 143 143  K 2.4* 2.6* 2.6*  CL 120* 121* 122*  CO2 12* 13* 12*  GLUCOSE 89 105* 90  BUN 12 10 9   CREATININE 1.29* 1.27* 1.11  CALCIUM 6.7* 7.0* 6.8*   LFT Recent Labs    07/30/22 0316 07/31/22 0301 08/01/22 0304  PROT 5.5* 4.8* 4.7*  ALBUMIN 3.0* 2.7* 2.5*  AST 24 24 22   ALT 12 13 9   ALKPHOS 85 80 68  BILITOT 1.0 0.5 0.8   PT/INR No results for input(s): "INR" in the last 72 hours.    Imaging/Other results: DG Chest  Port 1 View  Result Date: 07/30/2022 CLINICAL DATA:  Shortness of breath. EXAM: PORTABLE CHEST 1 VIEW COMPARISON:  Chest radiograph dated Jul 29, 2022 FINDINGS: The heart size and mediastinal contours are within normal limits. Both lungs are clear. Elevation of the left hemidiaphragm with distended bowel loops in the left upper quadrant. The visualized skeletal structures are unremarkable. IMPRESSION: 1. No acute cardiopulmonary process 2. Elevation of the left hemidiaphragm with distended bowel loops in the left upper quadrant. Electronically Signed   By: Larose Hires D.O.   On: 07/30/2022 22:40   DG KNEE 3 VIEW RIGHT  Result Date: 07/30/2022 CLINICAL DATA:  Right knee pain. EXAM: RIGHT KNEE - 3 VIEW COMPARISON:  Right femur radiographs 06/04/2022 FINDINGS: There is diffuse decreased bone mineralization. Severe patellofemoral joint space narrowing and peripheral osteophytosis. No joint effusion. Moderate posterior proximal tibial degenerative spurring. Moderate to severe medial joint space narrowing and peripheral osteophytosis. Minimal peripheral lateral chronic degenerative spurring without significant joint space narrowing. A calcified density measuring 6 mm again overlies the distal anterior lateral thigh soft tissues, nonspecific. No acute fracture is seen. No dislocation. Partial visualization of femoral intramedullary nail. 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 Electronically Signed   By: Neita Garnet M.D.   On: 07/30/2022 17:59   VAS [place] LOWER EXTREMITY VENOUS (DVT)  Result Date: 07/30/2022  Lower Venous DVT Study Patient Name:  Frank Hernandez  Date of Exam:   07/30/2022 Medical Rec #: [id number]            Accession #:    [id number] Date of Birth: [birth year]             Patient Gender: M Patient Age:   58 years Exam Location:  [facility] Procedure:      VAS [place] LOWER EXTREMITY VENOUS (DVT) Referring Phys: Jon Billings REGALADO  --------------------------------------------------------------------------------  Indications: Edema.  Risk Factors: Confirmed PE. Anticoagulation: Heparin. Limitations: Body habitus, poor ultrasound/tissue interface and patient positioning. Comparison Study: No prior studies. Performing Technologist: Chanda Busing RVT  Examination Guidelines: A complete evaluation includes B-mode imaging, spectral Doppler, color Doppler, and power Doppler as needed of all accessible portions of each vessel. Bilateral testing is considered an integral part of a complete examination. Limited examinations for reoccurring indications may be performed as noted. The reflux portion of the exam is performed with the patient in reverse Trendelenburg.  +---------+---------------+---------+-----------+----------+-------------------+ RIGHT    CompressibilityPhasicitySpontaneityPropertiesThrombus Aging      +---------+---------------+---------+-----------+----------+-------------------+ CFV      Full           Yes      Yes                                      +---------+---------------+---------+-----------+----------+-------------------+ SFJ      Full                                                             +---------+---------------+---------+-----------+----------+-------------------+ FV Prox  Full                                                             +---------+---------------+---------+-----------+----------+-------------------+ FV Mid                  Yes      Yes                                      +---------+---------------+---------+-----------+----------+-------------------+ FV Distal               Yes      Yes                                      +---------+---------------+---------+-----------+----------+-------------------+ PFV      Full                                                              +---------+---------------+---------+-----------+----------+-------------------+ POP      Full  Yes      Yes                                      +---------+---------------+---------+-----------+----------+-------------------+ PTV      Full                                                             +---------+---------------+---------+-----------+----------+-------------------+ PERO                                                  Not well visualized +---------+---------------+---------+-----------+----------+-------------------+   +---------+---------------+---------+-----------+----------+-------------------+ LEFT     CompressibilityPhasicitySpontaneityPropertiesThrombus Aging      +---------+---------------+---------+-----------+----------+-------------------+ CFV      Partial        Yes      Yes                  Acute               +---------+---------------+---------+-----------+----------+-------------------+ SFJ      Full                                                             +---------+---------------+---------+-----------+----------+-------------------+ FV Prox  Partial        Yes      No                   Acute               +---------+---------------+---------+-----------+----------+-------------------+ FV Mid   Partial        Yes      Yes                  Age Indeterminate   +---------+---------------+---------+-----------+----------+-------------------+ FV Distal                                             Not well visualized +---------+---------------+---------+-----------+----------+-------------------+ PFV      Full           Yes      Yes                                      +---------+---------------+---------+-----------+----------+-------------------+ POP      Partial        Yes      Yes                  Age Indeterminate   +---------+---------------+---------+-----------+----------+-------------------+  PTV      Full                                                             +---------+---------------+---------+-----------+----------+-------------------+  PERO                                                  Not well visualized +---------+---------------+---------+-----------+----------+-------------------+ Thrombus located in the common femoral vein is noted to be in the distal segment.    Summary: RIGHT: - There is no evidence of deep vein thrombosis in the lower extremity. However, portions of this examination were limited- see technologist comments above.  - No cystic structure found in the popliteal fossa.  LEFT: - Findings consistent with acute deep vein thrombosis involving the left common femoral vein, and left femoral vein. - Findings consistent with age indeterminate deep vein thrombosis involving the left popliteal vein. - No cystic structure found in the popliteal fossa.  *See table(s) above for measurements and observations. Electronically signed by Sherald Hess MD on 07/30/2022 at 4:42:31 PM.    Final    ECHOCARDIOGRAM COMPLETE  Result Date: 07/30/2022    ECHOCARDIOGRAM REPORT   Patient Name:   Frank Hernandez Date of Exam: 07/30/2022 Medical Rec #:  [id number]           Height:       69.0 in Accession #:    [id number]          Weight:       199.0 lb Date of Birth:  October 18, 1940            BSA:          2.062 m Patient Age:    82 years            BP:           156/65 mmHg Patient Gender: M                   HR:           74 bpm. Exam Location:  Inpatient Procedure: 2D Echo, Color Doppler, Cardiac Doppler and Intracardiac            Opacification Agent Indications:    Pulmonary Embolus  History:        Patient has prior history of Echocardiogram examinations, most                 recent 04/22/2022. CKD, Signs/Symptoms:Syncope; Risk                 Factors:Hypertension.  Sonographer:    Milbert Coulter Referring Phys: [id number] DAVID MANUEL ORTIZ  Sonographer Comments: Technically  difficult study due to poor echo windows, suboptimal parasternal window, suboptimal apical window and no subcostal window. Image acquisition challenging due to patient body habitus and Image acquisition challenging due to uncooperative patient. IMPRESSIONS  1. Left ventricular ejection fraction, by estimation, is 65 to 70%. The left ventricle has normal function. The left ventricle has no regional wall motion abnormalities. Left ventricular diastolic parameters are consistent with Grade I diastolic dysfunction (impaired relaxation).  2. Right ventricular systolic function is normal. The right ventricular size is normal.  3. The mitral valve is normal in structure. Trivial mitral valve regurgitation. No evidence of mitral stenosis.  4. The aortic valve is normal in structure. Aortic valve regurgitation is trivial. No aortic stenosis is present.  5. The inferior vena cava is normal in size with greater than 50% respiratory variability, suggesting right atrial pressure of 3 mmHg. FINDINGS  Left Ventricle: Left  ventricular ejection fraction, by estimation, is 65 to 70%. The left ventricle has normal function. The left ventricle has no regional wall motion abnormalities. Definity contrast agent was given IV to delineate the left ventricular  endocardial borders. The left ventricular internal cavity size was normal in size. There is no left ventricular hypertrophy. Left ventricular diastolic parameters are consistent with Grade I diastolic dysfunction (impaired relaxation). Right Ventricle: The right ventricular size is normal. No increase in right ventricular wall thickness. Right ventricular systolic function is normal. Left Atrium: Left atrial size was normal in size. Right Atrium: Right atrial size was normal in size. Pericardium: There is no evidence of pericardial effusion. Mitral Valve: The mitral valve is normal in structure. Trivial mitral valve regurgitation. No evidence of mitral valve stenosis. Tricuspid  Valve: The tricuspid valve is normal in structure. Tricuspid valve regurgitation is mild . No evidence of tricuspid stenosis. Aortic Valve: The aortic valve is normal in structure. Aortic valve regurgitation is trivial. Aortic regurgitation PHT measures 434 msec. No aortic stenosis is present. Aortic valve mean gradient measures 6.0 mmHg. Aortic valve peak gradient measures 10.8 mmHg. Aortic valve area, by VTI measures 2.35 cm. Pulmonic Valve: The pulmonic valve was normal in structure. Pulmonic valve regurgitation is not visualized. No evidence of pulmonic stenosis. Aorta: The aortic root is normal in size and structure. Venous: The inferior vena cava is normal in size with greater than 50% respiratory variability, suggesting right atrial pressure of 3 mmHg. IAS/Shunts: No atrial level shunt detected by color flow Doppler.  LEFT VENTRICLE PLAX 2D LVIDd:         3.30 cm LVIDs:         2.60 cm LV PW:         1.50 cm LV IVS:        1.60 cm LVOT diam:     2.20 cm LV SV:         64 LV SV Index:   31 LVOT Area:     3.80 cm  RIGHT VENTRICLE TAPSE (M-mode): 1.8 cm AORTIC VALVE AV Area (Vmax):    2.53 cm AV Area (Vmean):   2.59 cm AV Area (VTI):     2.35 cm AV Vmax:           164.00 cm/s AV Vmean:          107.000 cm/s AV VTI:            0.271 m AV Peak Grad:      10.8 mmHg AV Mean Grad:      6.0 mmHg LVOT Vmax:         109.20 cm/s LVOT Vmean:        72.850 cm/s LVOT VTI:          0.167 m LVOT/AV VTI ratio: 0.62 AI PHT:            434 msec  AORTA Ao Root diam: 4.30 cm TRICUSPID VALVE TR Peak grad:   42.0 mmHg TR Vmax:        324.00 cm/s  SHUNTS Systemic VTI:  0.17 m Systemic Diam: 2.20 cm Chilton Si MD Electronically signed by Chilton Si MD Signature Date/Time: 07/30/2022/3:47:49 PM    Final       Assessment and Plan:  82 year old male with multiple myeloma, currently receiving treatment, admitted with respiratory symptoms, found to have new pulmonary embolism, also with abdominal distention for  several days and several months of diarrhea with incontinence, found to have significantly dilated colon.  Initially, colonic dilation thought to be secondary to possible obstruction, possibly involving his large inguinal hernia, but given his ongoing passage of stool and lack of significant pain, obstruction was felt to be very unlikely.  Given the significant colonic dilation with minimal symptoms, a pseudoobstruction may be more likely. He was found to have E. coli on his GI pathogen panel, which may explain his profuse watery diarrhea, but would not explain his colonic distention.  Also, the patient reports having diarrhea for many months, which would be atypical for an E. coli infection.  However, there may be some issues with the reliability of his reported history, given that he reported having vomiting this morning, but this was denied by his nursing staff.  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 - Can consider pharmacologic therapy for Ogilvie's to include neostigmine if patient does not show clinical improvement - Repeat plain film in next 24-48 hours   Diarrhea, possibly related to daratumumab; E. Coli detected on PCR panel, unclear if true pathogen - Agree with brief course of azithromycin for possible E. Coli infection to see if diarrhea improves.  PE - Lovenox  Cellulitis - Abx per hospitalist  Dr. Chales Abrahams will be assuming inpatient GI care tomorrow   Jenel Lucks, MD  08/01/2022, 11:05 AM [facility]

## 2022-08-01 NOTE — Progress Notes (Signed)
Subjective/Chief Complaint: Denies abdominal pain   Objective: Vital signs in last 24 hours: Temp:  [97.5 F (36.4 C)-98.5 F (36.9 C)] 97.9 F (36.6 C) (06/02 0807) Pulse Rate:  [57-77] 66 (06/02 0500) Resp:  [13-21] 14 (06/02 0500) BP: (120-154)/(49-74) 146/63 (06/02 0500) SpO2:  [90 %-100 %] 100 % (06/02 0832) Last BM Date : 07/31/22  Intake/Output from previous day: 06/01 0701 - 06/02 0700 In: 2172.2 [P.O.:480; I.V.:766.5; IV Piggyback:925.7] Out: 3690 [Urine:3000; Stool:690] Intake/Output this shift: No intake/output data recorded.  Abdomen: soft ND NT LIH reduced RIH unchanged   Lab Results:  Recent Labs    07/31/22 0301 08/01/22 0304  WBC 5.0 3.9*  HGB 8.2* 8.0*  HCT 24.0* 23.9*  PLT 212 213   BMET Recent Labs    07/31/22 1901 08/01/22 0304  NA 143 143  K 2.6* 2.6*  CL 121* 122*  CO2 13* 12*  GLUCOSE 105* 90  BUN 10 9  CREATININE 1.27* 1.11  CALCIUM 7.0* 6.8*   PT/INR No results for input(s): "LABPROT", "INR" in the last 72 hours. ABG Recent Labs    07/30/22 2306  HCO3 13.4*    Studies/Results: DG Chest Port 1 View  Result Date: 07/30/2022 CLINICAL DATA:  Shortness of breath. EXAM: PORTABLE CHEST 1 VIEW COMPARISON:  Chest radiograph dated Jul 29, 2022 FINDINGS: The heart size and mediastinal contours are within normal limits. Both lungs are clear. Elevation of the left hemidiaphragm with distended bowel loops in the left upper quadrant. The visualized skeletal structures are unremarkable. IMPRESSION: 1. No acute cardiopulmonary process 2. Elevation of the left hemidiaphragm with distended bowel loops in the left upper quadrant. Electronically Signed   By: Larose Hires D.O.   On: 07/30/2022 22:40   DG KNEE 3 VIEW RIGHT  Result Date: 07/30/2022 CLINICAL DATA:  Right knee pain. EXAM: RIGHT KNEE - 3 VIEW COMPARISON:  Right femur radiographs 06/04/2022 FINDINGS: There is diffuse decreased bone mineralization. Severe patellofemoral joint space  narrowing and peripheral osteophytosis. No joint effusion. Moderate posterior proximal tibial degenerative spurring. Moderate to severe medial joint space narrowing and peripheral osteophytosis. Minimal peripheral lateral chronic degenerative spurring without significant joint space narrowing. A calcified density measuring 6 mm again overlies the distal anterior lateral thigh soft tissues, nonspecific. No acute fracture is seen. No dislocation. Partial visualization of femoral intramedullary nail. 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 Electronically Signed   By: Neita Garnet M.D.   On: 07/30/2022 17:59   VAS [place] LOWER EXTREMITY VENOUS (DVT)  Result Date: 07/30/2022  Lower Venous DVT Study Patient Name:  Frank Hernandez  Date of Exam:   07/30/2022 Medical Rec #: [id number]            Accession #:    [id number] Date of Birth: 12/29/1940             Patient Gender: M Patient Age:   82 years Exam Location:  [facility] Procedure:      VAS [place] LOWER EXTREMITY VENOUS (DVT) Referring Phys: Jon Billings REGALADO --------------------------------------------------------------------------------  Indications: Edema.  Risk Factors: Confirmed PE. Anticoagulation: Heparin. Limitations: Body habitus, poor ultrasound/tissue interface and patient positioning. Comparison Study: No prior studies. Performing Technologist: Chanda Busing RVT  Examination Guidelines: A complete evaluation includes B-mode imaging, spectral Doppler, color Doppler, and power Doppler as needed of all accessible portions of each vessel. Bilateral testing is considered an integral part of a complete examination. Limited examinations for reoccurring indications may be performed as  noted. The reflux portion of the exam is performed with the patient in reverse Trendelenburg.  +---------+---------------+---------+-----------+----------+-------------------+ RIGHT     CompressibilityPhasicitySpontaneityPropertiesThrombus Aging      +---------+---------------+---------+-----------+----------+-------------------+ CFV      Full           Yes      Yes                                      +---------+---------------+---------+-----------+----------+-------------------+ SFJ      Full                                                             +---------+---------------+---------+-----------+----------+-------------------+ FV Prox  Full                                                             +---------+---------------+---------+-----------+----------+-------------------+ FV Mid                  Yes      Yes                                      +---------+---------------+---------+-----------+----------+-------------------+ FV Distal               Yes      Yes                                      +---------+---------------+---------+-----------+----------+-------------------+ PFV      Full                                                             +---------+---------------+---------+-----------+----------+-------------------+ POP      Full           Yes      Yes                                      +---------+---------------+---------+-----------+----------+-------------------+ PTV      Full                                                             +---------+---------------+---------+-----------+----------+-------------------+ PERO                                                  Not well  visualized +---------+---------------+---------+-----------+----------+-------------------+   +---------+---------------+---------+-----------+----------+-------------------+ LEFT     CompressibilityPhasicitySpontaneityPropertiesThrombus Aging      +---------+---------------+---------+-----------+----------+-------------------+ CFV      Partial        Yes      Yes                  Acute                +---------+---------------+---------+-----------+----------+-------------------+ SFJ      Full                                                             +---------+---------------+---------+-----------+----------+-------------------+ FV Prox  Partial        Yes      No                   Acute               +---------+---------------+---------+-----------+----------+-------------------+ FV Mid   Partial        Yes      Yes                  Age Indeterminate   +---------+---------------+---------+-----------+----------+-------------------+ FV Distal                                             Not well visualized +---------+---------------+---------+-----------+----------+-------------------+ PFV      Full           Yes      Yes                                      +---------+---------------+---------+-----------+----------+-------------------+ POP      Partial        Yes      Yes                  Age Indeterminate   +---------+---------------+---------+-----------+----------+-------------------+ PTV      Full                                                             +---------+---------------+---------+-----------+----------+-------------------+ PERO                                                  Not well visualized +---------+---------------+---------+-----------+----------+-------------------+ Thrombus located in the common femoral vein is noted to be in the distal segment.    Summary: RIGHT: - There is no evidence of deep vein thrombosis in the lower extremity. However, portions of this examination were limited- see technologist comments above.  - No cystic structure found in the popliteal fossa.  LEFT: - Findings consistent with acute deep vein thrombosis involving the left common femoral vein, and left femoral vein. - Findings consistent with age indeterminate deep vein thrombosis involving the left popliteal vein. -  No cystic structure found in the  popliteal fossa.  *See table(s) above for measurements and observations. Electronically signed by Sherald Hess MD on 07/30/2022 at 4:42:31 PM.    Final    ECHOCARDIOGRAM COMPLETE  Result Date: 07/30/2022    ECHOCARDIOGRAM REPORT   Patient Name:   Frank Hernandez Date of Exam: 07/30/2022 Medical Rec #:  [id number]           Height:       69.0 in Accession #:    [id number]          Weight:       199.0 lb Date of Birth:  24-Oct-1940            BSA:          2.062 m Patient Age:    82 years            BP:           156/65 mmHg Patient Gender: M                   HR:           74 bpm. Exam Location:  Inpatient Procedure: 2D Echo, Color Doppler, Cardiac Doppler and Intracardiac            Opacification Agent Indications:    Pulmonary Embolus  History:        Patient has prior history of Echocardiogram examinations, most                 recent 04/22/2022. CKD, Signs/Symptoms:Syncope; Risk                 Factors:Hypertension.  Sonographer:    Milbert Coulter Referring Phys: [id number] DAVID MANUEL ORTIZ  Sonographer Comments: Technically difficult study due to poor echo windows, suboptimal parasternal window, suboptimal apical window and no subcostal window. Image acquisition challenging due to patient body habitus and Image acquisition challenging due to uncooperative patient. IMPRESSIONS  1. Left ventricular ejection fraction, by estimation, is 65 to 70%. The left ventricle has normal function. The left ventricle has no regional wall motion abnormalities. Left ventricular diastolic parameters are consistent with Grade I diastolic dysfunction (impaired relaxation).  2. Right ventricular systolic function is normal. The right ventricular size is normal.  3. The mitral valve is normal in structure. Trivial mitral valve regurgitation. No evidence of mitral stenosis.  4. The aortic valve is normal in structure. Aortic valve regurgitation is trivial. No aortic stenosis is present.  5. The inferior vena cava is normal in size  with greater than 50% respiratory variability, suggesting right atrial pressure of 3 mmHg. FINDINGS  Left Ventricle: Left ventricular ejection fraction, by estimation, is 65 to 70%. The left ventricle has normal function. The left ventricle has no regional wall motion abnormalities. Definity contrast agent was given IV to delineate the left ventricular  endocardial borders. The left ventricular internal cavity size was normal in size. There is no left ventricular hypertrophy. Left ventricular diastolic parameters are consistent with Grade I diastolic dysfunction (impaired relaxation). Right Ventricle: The right ventricular size is normal. No increase in right ventricular wall thickness. Right ventricular systolic function is normal. Left Atrium: Left atrial size was normal in size. Right Atrium: Right atrial size was normal in size. Pericardium: There is no evidence of pericardial effusion. Mitral Valve: The mitral valve is normal in structure. Trivial mitral valve regurgitation. No evidence of mitral valve stenosis. Tricuspid Valve: The tricuspid valve is normal in structure.  Tricuspid valve regurgitation is mild . No evidence of tricuspid stenosis. Aortic Valve: The aortic valve is normal in structure. Aortic valve regurgitation is trivial. Aortic regurgitation PHT measures 434 msec. No aortic stenosis is present. Aortic valve mean gradient measures 6.0 mmHg. Aortic valve peak gradient measures 10.8 mmHg. Aortic valve area, by VTI measures 2.35 cm. Pulmonic Valve: The pulmonic valve was normal in structure. Pulmonic valve regurgitation is not visualized. No evidence of pulmonic stenosis. Aorta: The aortic root is normal in size and structure. Venous: The inferior vena cava is normal in size with greater than 50% respiratory variability, suggesting right atrial pressure of 3 mmHg. IAS/Shunts: No atrial level shunt detected by color flow Doppler.  LEFT VENTRICLE PLAX 2D LVIDd:         3.30 cm LVIDs:         2.60 cm  LV PW:         1.50 cm LV IVS:        1.60 cm LVOT diam:     2.20 cm LV SV:         64 LV SV Index:   31 LVOT Area:     3.80 cm  RIGHT VENTRICLE TAPSE (M-mode): 1.8 cm AORTIC VALVE AV Area (Vmax):    2.53 cm AV Area (Vmean):   2.59 cm AV Area (VTI):     2.35 cm AV Vmax:           164.00 cm/s AV Vmean:          107.000 cm/s AV VTI:            0.271 m AV Peak Grad:      10.8 mmHg AV Mean Grad:      6.0 mmHg LVOT Vmax:         109.20 cm/s LVOT Vmean:        72.850 cm/s LVOT VTI:          0.167 m LVOT/AV VTI ratio: 0.62 AI PHT:            434 msec  AORTA Ao Root diam: 4.30 cm TRICUSPID VALVE TR Peak grad:   42.0 mmHg TR Vmax:        324.00 cm/s  SHUNTS Systemic VTI:  0.17 m Systemic Diam: 2.20 cm Chilton Si MD Electronically signed by Chilton Si MD Signature Date/Time: 07/30/2022/3:47:49 PM    Final     Anti-infectives: Anti-infectives (From admission, onward)    Start     Dose/Rate Route Frequency Ordered Stop   08/01/22 1400  ceFAZolin (ANCEF) IVPB 2g/100 mL premix        2 g 200 mL/hr over 30 Minutes Intravenous Every 8 hours 08/01/22 0824 08/03/22 1359   07/31/22 1600  azithromycin (ZITHROMAX) tablet 500 mg        500 mg Oral Daily 07/31/22 1446 08/03/22 0959   07/31/22 1000  vancomycin (VANCOREADY) IVPB 1500 mg/300 mL  Status:  Discontinued        1,500 mg 150 mL/hr over 120 Minutes Intravenous Every 48 hours 07/29/22 1243 07/30/22 0755   07/30/22 2200  acyclovir (ZOVIRAX) tablet 400 mg        400 mg Oral Daily at bedtime 07/30/22 1843     07/30/22 1400  ceFAZolin (ANCEF) IVPB 2g/100 mL premix  Status:  Discontinued        2 g 200 mL/hr over 30 Minutes Intravenous Every 8 hours 07/30/22 0843 08/01/22 0824   07/30/22 0900  doxycycline (VIBRAMYCIN) 100 mg in  sodium chloride 0.9 % 250 mL IVPB  Status:  Discontinued        100 mg 125 mL/hr over 120 Minutes Intravenous Every 12 hours 07/30/22 0809 07/31/22 1445   07/29/22 1800  ceFEPIme (MAXIPIME) 2 g in sodium chloride 0.9 % 100 mL  IVPB  Status:  Discontinued        2 g 200 mL/hr over 30 Minutes Intravenous Every 12 hours 07/29/22 1234 07/30/22 0841   07/29/22 1230  metroNIDAZOLE (FLAGYL) IVPB 500 mg  Status:  Discontinued        500 mg 100 mL/hr over 60 Minutes Intravenous Every 12 hours 07/29/22 1205 07/30/22 0909   07/29/22 0930  piperacillin-tazobactam (ZOSYN) IVPB 3.375 g        3.375 g 100 mL/hr over 30 Minutes Intravenous  Once 07/29/22 0920 07/29/22 1038   07/29/22 0930  vancomycin (VANCOREADY) IVPB 2000 mg/400 mL        2,000 mg 200 mL/hr over 120 Minutes Intravenous  Once 07/29/22 1610 07/29/22 1246       Assessment/Plan:      82 y/o male with MMP including but not limited to 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 He has bilateral inguinal hernias that were both present on a previous CT scan.   Look ok today  C diff negative    No immediate plans for surgery given poor overall heath   No acute indication for intervention  Clovis Pu Takirah Binford MD  08/01/2022 Moderate complexity

## 2022-08-01 NOTE — Progress Notes (Signed)
PROGRESS NOTE    Frank Hernandez  BJY:[id number] DOB: [birth year] DOA: 07/29/2022 PCP: [name] (Inactive)   Brief Narrative: 82 year old with past medical history significant for epistaxis, hypertension, prediabetes, stage IIIa CKD, pathological fracture of the right femoral, multiple myeloma who presents to the ED due to wheezing and rhonchi.  EMS gave albuterol and routes.  Patient has been taking antibiotics for cellulitis.  He has had worsening of his right foot wound.  He reports abdominal distention, he report small watery stool.  Evaluation in the ED chest x-ray no active cardiopulmonary disease.  CTA chest showed left lower lobe segmental pulmonary PE with overall minimal clot burden.  CT abdomen with dilated colon with air-fluid level seen and scattered colonic diverticula.  Transition seen in the area of the new left inguinal hernia involving the sigmoid colon.  No pneumatosis.  Small bowel and stomach are known dilated but there is an enteric tube.  Separate large known right inguinal hernia involving the small bowel and significant portion of the urinary bladder.  Lytic bone lesion consistent with known history of myeloma.  Patient admitted with PE and a small bowel obstruction.  Surgery has been consulted.   Assessment & Plan:   Principal Problem:   Bowel obstruction (HCC) Active Problems:   Essential hypertension   Pre-diabetes   Hypokalemia   Chronic kidney disease, stage 3a (HCC)   Normocytic anemia   Multiple myeloma not having achieved remission (HCC)   Hypomagnesemia   Prolonged QT interval   Cellulitis of both lower extremities   Acute pulmonary embolism (HCC)   1-Acute Pulmonary Embolism, Acute DVT left femoral vein -CTA Chest: Left lower lobe segmental pulmonary embolism. Overall minimal clot burden. -Doppler LE: indings consistent with acute deep vein thrombosis involving the left common femoral vein, and left femoral vein. Findings consistent with age  indeterminate deep vein thrombosis  involving the left popliteal vein.  Continue with  Lovenox.  ECHO: RV function Normal.   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 Transition seen in the area of the new left inguinal hernia involving the sigmoid colon. Please correlate for reduce ability. No pneumatosis, free air or fluid. Small bowel and stomach are nondilated but there is an enteric tube. -NG tube  has been removed by General Surgery.  -He is having Multiples watery BM. -Diest advanced to regular.  Plan to repeat X ray tomorrow. Could consider Neostigmine if no improvement.   3-Diarrhea; C diff negative. Having issues with Diarrhea since last hospitalization.  GI pathogen  positive for enteropathogenic E coli.  Started Azithro, short course.  Thought to be related to Chemo: Daratumumab.  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  Avoid Imodium.  Could consider neostigmine if no clinical improvement. Repeat X ray tomorrow.     4-Metabolic Acidosis: in setting of Diarrhea, dehydration.  Holding Bicarb Gtt due to hypokalemia.  Continue with Bicarb oral supplement.   5-Cellulitis of both lower extremity: Change Vancomycin to Doxy due to AKI.  Change cefepime to Ancef to avoid confusion. Plan to treat for total 5 days. Day 4  6-Hypomagnesemia: IV magnesium ordered.   7-Hypokalemia: IV KCL times 5 runs. Oral.  Repeat Bmet this afternoon.   Prolonged QT 2: Replete electrolytes. QT 450  Respiratory Distress; Wheezing.  Check x ray negative for pulmonary edema. He has profuse diarrhea. Will  continue with low rate IV fluids.  Continue with schedule nebulizer.   Hypertension: PRN Metoprolol.   Pre-Diabetes: On diet controlled.   AKI on CKD  stage IIIa: Previous Cr 1.4--1.3 Presents with Cr 1.8.  Related to hypovolemia. Pre renal.  Improving with fluids.   Normocytic  anemia: anemia of malignancy MM Monitor hb.   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 PRN tylenol  Multiple myeloma Needs to follow up with oncology out patient.  See wound care documentation below.  Pressure Injury 03/29/22 Buttocks Right Stage 1 -  Intact skin with non-blanchable redness of a localized area usually over a bony prominence. (Active)  03/29/22 2140  Location: Buttocks  Location Orientation: Right  Staging: Stage 1 -  Intact skin with non-blanchable redness of a localized area usually over a bony prominence.  Wound Description (Comments):   Present on Admission: Yes     Pressure Injury 05/28/22 Heel Right Stage 3 -  Full thickness tissue loss. Subcutaneous fat may be visible but bone, tendon or muscle are NOT exposed. Open wound, broken from pressure & dry skin (Active)  05/28/22 1400  Location: Heel  Location Orientation: Right  Staging: Stage 3 -  Full thickness tissue loss. Subcutaneous fat may be visible but bone, tendon or muscle are NOT exposed.  Wound Description (Comments): Open wound, broken from pressure & dry skin  Present on Admission: Yes  Dressing Type Foam - Lift dressing to assess site every shift 08/01/22 0800    Estimated body mass index is 29.39 kg/m as calculated from the following:   Height as of this encounter: 5\' 9"  (1.753 m).   Weight as of this encounter: 90.3 kg.   DVT prophylaxis: Lovenox Code Status: Full code Family Communication: Wife who was at bedside 6/01 Disposition Plan:  Status is: Inpatient Remains inpatient appropriate because: management of medical problems, AKI, metabolic acidosis. PE    Consultants:  Surgery   Procedures:  ECHO  Antimicrobials:    Subjective: Denies dyspnea, denies pain, he is feeling ok. He continue to  have Diarrhea, 600 cc in Bag from rectal tube   Objective: Vitals:   08/01/22 0900 08/01/22 1000 08/01/22 1100 08/01/22 1200  BP: (!) 151/64 (!) 161/78 (!) 153/75 (!) 159/72  Pulse: 67 69 76 67  Resp: 16 16 15 15   Temp:      TempSrc:      SpO2: 100% 100% 99% 100%  Weight:      Height:        Intake/Output Summary (Last 24 hours) at 08/01/2022 1220 Last data filed at 08/01/2022 0800 Gross per 24 hour  Intake 883.6 ml  Output 2200 ml  Net -1316.4 ml    Filed Weights   07/29/22 0822  Weight: 90.3 kg    Examination:  General exam: Alert.  Respiratory system: CTA Cardiovascular system: S 1, S 2 RRR Gastrointestinal system: BS present, CT, ND Central nervous system: Alert Extremities: Right heel with open wound, coin size.    Data Reviewed: I have personally reviewed following labs and imaging studies  CBC: Recent Labs  Lab 07/29/22 0835 07/30/22 0316 07/31/22 0301 08/01/22 0304  WBC 6.0 6.3 5.0 3.9*  NEUTROABS 2.8  --   --   --   HGB 9.9* 9.0* 8.2* 8.0*  HCT 29.8* 27.9* 24.0* 23.9*  MCV 96.1 99.3 94.5 93.4  PLT 252 230 212 213    Basic Metabolic Panel: Recent Labs  Lab 07/29/22 0835 07/30/22  1610 07/30/22 1521 07/31/22 0301 07/31/22 1901 08/01/22 0304  NA 140 141 141 142 143 143  K 3.3* 3.5 4.0 2.4* 2.6* 2.6*  CL 119* 122* 122* 120* 121* 122*  CO2 12* 9* 9* 12* 13* 12*  GLUCOSE 94 99 129* 89 105* 90  BUN 11 13 13 12 10 9   CREATININE 1.83* 1.54* 1.39* 1.29* 1.27* 1.11  CALCIUM 6.9* 6.7* 7.1* 6.7* 7.0* 6.8*  MG 1.4* 2.0  --  1.5*  --  1.7    GFR: Estimated Creatinine Clearance: 58 mL/min (by C-G formula based on SCr of 1.11 mg/dL). Liver Function Tests: Recent Labs  Lab 07/30/22 0316 07/31/22 0301 08/01/22 0304  AST 24 24 22   ALT 12 13 9   ALKPHOS 85 80 68  BILITOT 1.0 0.5 0.8  PROT 5.5* 4.8* 4.7*  ALBUMIN 3.0* 2.7* 2.5*    No results for input(s): "LIPASE", "AMYLASE" in the last 168 hours. No results for input(s): "AMMONIA" in the last  168 hours. Coagulation Profile: No results for input(s): "INR", "PROTIME" in the last 168 hours. Cardiac Enzymes: No results for input(s): "CKTOTAL", "CKMB", "CKMBINDEX", "TROPONINI" in the last 168 hours. BNP (last 3 results) No results for input(s): "PROBNP" in the last 8760 hours. HbA1C: No results for input(s): "HGBA1C" in the last 72 hours. CBG: Recent Labs  Lab 07/30/22 2335 07/31/22 1111 07/31/22 1743 08/01/22 0644 08/01/22 1108  GLUCAP 98 91 105* 102* 95    Lipid Profile: No results for input(s): "CHOL", "HDL", "LDLCALC", "TRIG", "CHOLHDL", "LDLDIRECT" in the last 72 hours. Thyroid Function Tests: No results for input(s): "TSH", "T4TOTAL", "FREET4", "T3FREE", "THYROIDAB" in the last 72 hours. Anemia Panel: No results for input(s): "VITAMINB12", "FOLATE", "FERRITIN", "TIBC", "IRON", "RETICCTPCT" in the last 72 hours. Sepsis Labs: Recent Labs  Lab 07/29/22 0918 07/29/22 1150  LATICACIDVEN 2.1* 1.8     Recent Results (from the past 240 hour(s))  SARS Coronavirus 2 by RT PCR (hospital order, performed in [facility] hospital lab) *cepheid single result test* Anterior Nasal Swab     Status: None   Collection Time: 07/29/22  8:26 AM   Specimen: Anterior Nasal Swab  Result Value Ref Range Status   SARS Coronavirus 2 by RT PCR NEGATIVE NEGATIVE Final    Comment: (NOTE) SARS-CoV-2 target nucleic acids are NOT DETECTED.  The SARS-CoV-2 RNA is generally detectable in upper and lower respiratory specimens during the acute phase of infection. The lowest concentration of SARS-CoV-2 viral copies this assay can detect is 250 copies / mL. A negative result does not preclude SARS-CoV-2 infection and should not be used as the sole basis for treatment or other patient management decisions.  A negative result may occur with improper specimen collection / handling, submission of specimen other than nasopharyngeal swab, presence of viral mutation(s) within the areas targeted by  this assay, and inadequate number of viral copies (<250 copies / mL). A negative result must be combined with clinical observations, patient history, and epidemiological information.  Fact Sheet for Patients:   RoadLapTop.co.za  Fact Sheet for Healthcare Providers: http://kim-miller.com/  This test is not yet approved or  cleared by the [place] FDA and has been authorized for detection and/or diagnosis of SARS-CoV-2 by FDA under an Emergency Use Authorization (EUA).  This EUA will remain in effect (meaning this test can be used) for the duration of the COVID-19 declaration under Section 564(b)(1) of the Act, 21 U.S.C. section 360bbb-3(b)(1), unless the authorization is terminated or revoked sooner.  Performed at [facility]  [facility], [address] [place], [place] [address]   Blood culture (routine x 2)     Status: None (Preliminary result)   Collection Time: 07/29/22  9:19 AM   Specimen: BLOOD  Result Value Ref Range Status   Specimen Description   Final    BLOOD PERIPHERAL Performed at [facility], [address] [place], [place] [address]    Special Requests   Final    BOTTLES DRAWN AEROBIC AND ANAEROBIC Blood Culture adequate volume Performed at [facility], [address], [place], [place] [address]    Culture   Final    NO GROWTH 3 DAYS Performed at [facility], [address] [place], [place] [address]    Report Status PENDING  Incomplete  Blood culture (routine x 2)     Status: None (Preliminary result)   Collection Time: 07/29/22  9:22 AM   Specimen: BLOOD  Result Value Ref Range Status   Specimen Description   Final    BLOOD RIGHT ANTECUBITAL Performed at [facility], [address], [place], [place] [address]    Special Requests   Final    BOTTLES DRAWN AEROBIC AND ANAEROBIC Blood Culture adequate volume Performed at [facility], [address], [place], [place] [address]    Culture   Final    NO GROWTH 3 DAYS Performed at [facility], [address], [place], [place] [address]    Report Status PENDING  Incomplete  MRSA Next Gen by PCR, Nasal     Status: None   Collection Time: 07/29/22  3:21 PM   Specimen: Nasal Mucosa; Nasal Swab  Result Value Ref Range Status   MRSA by PCR Next Gen NOT DETECTED NOT DETECTED Final    Comment: (NOTE) The GeneXpert MRSA Assay (FDA approved for NASAL specimens only), is one component of a comprehensive MRSA colonization surveillance program. It is not intended to diagnose MRSA infection nor to guide or monitor treatment for MRSA infections. Test performance is not FDA approved in patients less than 81 years old. Performed at [facility], [address], [place], [place] [address]   C Difficile Quick Screen w PCR reflex     Status: None   Collection Time: 07/30/22  1:46 PM   Specimen: STOOL  Result Value Ref Range Status   C Diff antigen NEGATIVE NEGATIVE Final   C Diff toxin NEGATIVE NEGATIVE Final   C Diff interpretation No C. difficile detected.  Final    Comment: Performed at [facility], [address], [place], [place] [address]  Gastrointestinal Panel by PCR , Stool     Status: Abnormal   Collection Time: 07/30/22  1:46 PM   Specimen: Stool  Result Value Ref Range Status   Campylobacter species NOT DETECTED NOT DETECTED Final   Plesimonas shigelloides NOT DETECTED NOT DETECTED Final   Salmonella species NOT DETECTED NOT DETECTED Final   Yersinia enterocolitica NOT DETECTED NOT DETECTED Final   Vibrio species NOT DETECTED NOT DETECTED Final   Vibrio cholerae NOT DETECTED NOT DETECTED Final   Enteroaggregative E coli (EAEC) NOT DETECTED NOT DETECTED Final   Enteropathogenic E coli (EPEC) DETECTED (A) NOT DETECTED Final    Comment: RESULT CALLED TO, READ BACK BY AND VERIFIED WITH:  ISABELLE URQIJO  07/31/2022 1420 CP    Enterotoxigenic E coli (ETEC) NOT DETECTED NOT DETECTED Final   Shiga like toxin producing E  coli (STEC) NOT DETECTED NOT DETECTED Final   Shigella/Enteroinvasive E coli (EIEC) NOT DETECTED NOT DETECTED Final   Cryptosporidium NOT DETECTED NOT DETECTED Final   Cyclospora cayetanensis NOT DETECTED NOT DETECTED Final   Entamoeba histolytica NOT DETECTED NOT DETECTED Final   Giardia lamblia NOT DETECTED NOT DETECTED Final   Adenovirus F40/41 NOT DETECTED NOT DETECTED Final   Astrovirus NOT DETECTED NOT DETECTED Final   Norovirus GI/GII NOT DETECTED NOT DETECTED Final   Rotavirus A NOT DETECTED NOT DETECTED Final   Sapovirus (I, II, IV, and V) NOT DETECTED NOT DETECTED Final    Comment: Performed at [facility], [address], [place], [place] [address]         Radiology Studies: DG Chest Port 1 View  Result Date: 07/30/2022 CLINICAL DATA:  Shortness of breath. EXAM: PORTABLE CHEST 1 VIEW COMPARISON:  Chest radiograph dated Jul 29, 2022 FINDINGS: The heart size and mediastinal contours are within normal limits. Both lungs are clear. Elevation of the left hemidiaphragm with distended bowel loops in the left upper quadrant. The visualized skeletal structures are unremarkable. IMPRESSION: 1. No acute cardiopulmonary process 2. Elevation of the left hemidiaphragm with distended bowel loops in the left upper quadrant. Electronically Signed   By: Larose Hires D.O.   On: 07/30/2022 22:40   DG KNEE 3 VIEW RIGHT  Result Date: 07/30/2022 CLINICAL DATA:  Right knee pain. EXAM: RIGHT KNEE - 3 VIEW COMPARISON:  Right femur radiographs 06/04/2022 FINDINGS: There is diffuse decreased bone mineralization. Severe patellofemoral joint space narrowing and peripheral osteophytosis. No joint effusion. Moderate posterior proximal tibial degenerative spurring. Moderate to severe medial joint space narrowing and peripheral osteophytosis. Minimal peripheral lateral chronic degenerative  spurring without significant joint space narrowing. A calcified density measuring 6 mm again overlies the distal anterior lateral thigh soft tissues, nonspecific. No acute fracture is seen. No dislocation. Partial visualization of femoral intramedullary nail. 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 Electronically Signed   By: Neita Garnet M.D.   On: 07/30/2022 17:59   VAS [place] LOWER EXTREMITY VENOUS (DVT)  Result Date: 07/30/2022  Lower Venous DVT Study Patient Name:  Frank Hernandez  Date of Exam:   07/30/2022 Medical Rec #: [id number]            Accession #:    [id number] Date of Birth: [birth year]             Patient Gender: M Patient Age:   51 years Exam Location:  [facility] Procedure:      VAS [place] LOWER EXTREMITY VENOUS (DVT) Referring Phys: Jon Billings Brystol Wasilewski --------------------------------------------------------------------------------  Indications: Edema.  Risk Factors: Confirmed PE. Anticoagulation: Heparin. Limitations: Body habitus, poor ultrasound/tissue interface and patient positioning. Comparison Study: No prior studies. Performing Technologist: Chanda Busing RVT  Examination Guidelines: A complete evaluation includes B-mode imaging, spectral Doppler, color Doppler, and power Doppler as needed of all accessible portions of each vessel. Bilateral testing is considered an integral part of a complete examination. Limited examinations for reoccurring indications may be performed as noted. The reflux portion of the exam is performed with the patient in reverse Trendelenburg.  +---------+---------------+---------+-----------+----------+-------------------+ RIGHT    CompressibilityPhasicitySpontaneityPropertiesThrombus Aging      +---------+---------------+---------+-----------+----------+-------------------+ CFV      Full           Yes      Yes                                       +---------+---------------+---------+-----------+----------+-------------------+  SFJ      Full                                                             +---------+---------------+---------+-----------+----------+-------------------+ FV Prox  Full                                                             +---------+---------------+---------+-----------+----------+-------------------+ FV Mid                  Yes      Yes                                      +---------+---------------+---------+-----------+----------+-------------------+ FV Distal               Yes      Yes                                      +---------+---------------+---------+-----------+----------+-------------------+ PFV      Full                                                             +---------+---------------+---------+-----------+----------+-------------------+ POP      Full           Yes      Yes                                      +---------+---------------+---------+-----------+----------+-------------------+ PTV      Full                                                             +---------+---------------+---------+-----------+----------+-------------------+ PERO                                                  Not well visualized +---------+---------------+---------+-----------+----------+-------------------+   +---------+---------------+---------+-----------+----------+-------------------+ LEFT     CompressibilityPhasicitySpontaneityPropertiesThrombus Aging      +---------+---------------+---------+-----------+----------+-------------------+ CFV      Partial        Yes      Yes                  Acute               +---------+---------------+---------+-----------+----------+-------------------+ SFJ      Full                                                             +---------+---------------+---------+-----------+----------+-------------------+  FV  Prox  Partial        Yes      No                   Acute               +---------+---------------+---------+-----------+----------+-------------------+ FV Mid   Partial        Yes      Yes                  Age Indeterminate   +---------+---------------+---------+-----------+----------+-------------------+ FV Distal                                             Not well visualized +---------+---------------+---------+-----------+----------+-------------------+ PFV      Full           Yes      Yes                                      +---------+---------------+---------+-----------+----------+-------------------+ POP      Partial        Yes      Yes                  Age Indeterminate   +---------+---------------+---------+-----------+----------+-------------------+ PTV      Full                                                             +---------+---------------+---------+-----------+----------+-------------------+ PERO                                                  Not well visualized +---------+---------------+---------+-----------+----------+-------------------+ Thrombus located in the common femoral vein is noted to be in the distal segment.    Summary: RIGHT: - There is no evidence of deep vein thrombosis in the lower extremity. However, portions of this examination were limited- see technologist comments above.  - No cystic structure found in the popliteal fossa.  LEFT: - Findings consistent with acute deep vein thrombosis involving the left common femoral vein, and left femoral vein. - Findings consistent with age indeterminate deep vein thrombosis involving the left popliteal vein. - No cystic structure found in the popliteal fossa.  *See table(s) above for measurements and observations. Electronically signed by Sherald Hess MD on 07/30/2022 at 4:42:31 PM.    Final    ECHOCARDIOGRAM COMPLETE  Result Date: 07/30/2022    ECHOCARDIOGRAM REPORT   Patient  Name:   Frank Hernandez Date of Exam: 07/30/2022 Medical Rec #:  [id number]           Height:       69.0 in Accession #:    [id number]          Weight:       199.0 lb Date of Birth:  05/04/1940            BSA:          2.062 m Patient Age:  82 years            BP:           156/65 mmHg Patient Gender: M                   HR:           74 bpm. Exam Location:  Inpatient Procedure: 2D Echo, Color Doppler, Cardiac Doppler and Intracardiac            Opacification Agent Indications:    Pulmonary Embolus  History:        Patient has prior history of Echocardiogram examinations, most                 recent 04/22/2022. CKD, Signs/Symptoms:Syncope; Risk                 Factors:Hypertension.  Sonographer:    Milbert Coulter Referring Phys: [id number] DAVID MANUEL ORTIZ  Sonographer Comments: Technically difficult study due to poor echo windows, suboptimal parasternal window, suboptimal apical window and no subcostal window. Image acquisition challenging due to patient body habitus and Image acquisition challenging due to uncooperative patient. IMPRESSIONS  1. Left ventricular ejection fraction, by estimation, is 65 to 70%. The left ventricle has normal function. The left ventricle has no regional wall motion abnormalities. Left ventricular diastolic parameters are consistent with Grade I diastolic dysfunction (impaired relaxation).  2. Right ventricular systolic function is normal. The right ventricular size is normal.  3. The mitral valve is normal in structure. Trivial mitral valve regurgitation. No evidence of mitral stenosis.  4. The aortic valve is normal in structure. Aortic valve regurgitation is trivial. No aortic stenosis is present.  5. The inferior vena cava is normal in size with greater than 50% respiratory variability, suggesting right atrial pressure of 3 mmHg. FINDINGS  Left Ventricle: Left ventricular ejection fraction, by estimation, is 65 to 70%. The left ventricle has normal function. The left ventricle has no  regional wall motion abnormalities. Definity contrast agent was given IV to delineate the left ventricular  endocardial borders. The left ventricular internal cavity size was normal in size. There is no left ventricular hypertrophy. Left ventricular diastolic parameters are consistent with Grade I diastolic dysfunction (impaired relaxation). Right Ventricle: The right ventricular size is normal. No increase in right ventricular wall thickness. Right ventricular systolic function is normal. Left Atrium: Left atrial size was normal in size. Right Atrium: Right atrial size was normal in size. Pericardium: There is no evidence of pericardial effusion. Mitral Valve: The mitral valve is normal in structure. Trivial mitral valve regurgitation. No evidence of mitral valve stenosis. Tricuspid Valve: The tricuspid valve is normal in structure. Tricuspid valve regurgitation is mild . No evidence of tricuspid stenosis. Aortic Valve: The aortic valve is normal in structure. Aortic valve regurgitation is trivial. Aortic regurgitation PHT measures 434 msec. No aortic stenosis is present. Aortic valve mean gradient measures 6.0 mmHg. Aortic valve peak gradient measures 10.8 mmHg. Aortic valve area, by VTI measures 2.35 cm. Pulmonic Valve: The pulmonic valve was normal in structure. Pulmonic valve regurgitation is not visualized. No evidence of pulmonic stenosis. Aorta: The aortic root is normal in size and structure. Venous: The inferior vena cava is normal in size with greater than 50% respiratory variability, suggesting right atrial pressure of 3 mmHg. IAS/Shunts: No atrial level shunt detected by color flow Doppler.  LEFT VENTRICLE PLAX 2D LVIDd:         3.30 cm LVIDs:  2.60 cm LV PW:         1.50 cm LV IVS:        1.60 cm LVOT diam:     2.20 cm LV SV:         64 LV SV Index:   31 LVOT Area:     3.80 cm  RIGHT VENTRICLE TAPSE (M-mode): 1.8 cm AORTIC VALVE AV Area (Vmax):    2.53 cm AV Area (Vmean):   2.59 cm AV Area  (VTI):     2.35 cm AV Vmax:           164.00 cm/s AV Vmean:          107.000 cm/s AV VTI:            0.271 m AV Peak Grad:      10.8 mmHg AV Mean Grad:      6.0 mmHg LVOT Vmax:         109.20 cm/s LVOT Vmean:        72.850 cm/s LVOT VTI:          0.167 m LVOT/AV VTI ratio: 0.62 AI PHT:            434 msec  AORTA Ao Root diam: 4.30 cm TRICUSPID VALVE TR Peak grad:   42.0 mmHg TR Vmax:        324.00 cm/s  SHUNTS Systemic VTI:  0.17 m Systemic Diam: 2.20 cm Chilton Si MD Electronically signed by Chilton Si MD Signature Date/Time: 07/30/2022/3:47:49 PM    Final         Scheduled Meds:  acyclovir  400 mg Oral QHS   azithromycin  500 mg Oral Daily   budesonide (PULMICORT) nebulizer solution  0.25 mg Nebulization BID   Chlorhexidine Gluconate Cloth  6 each Topical Daily   enoxaparin (LOVENOX) injection  1 mg/kg Subcutaneous Q12H   ipratropium-albuterol  3 mL Nebulization TID   leptospermum manuka honey  1 Application Topical Daily   pantoprazole (PROTONIX) IV  40 mg Intravenous Q24H   sodium bicarbonate  1,300 mg Oral TID   Continuous Infusions:  0.9 % NaCl with KCl 20 mEq / L 75 mL/hr at 08/01/22 0800    ceFAZolin (ANCEF) IV     potassium chloride 10 mEq (08/01/22 1147)     LOS: 3 days    Time spent: 35 minutes.     Alba Cory, MD [facility]   If 7PM-7AM, please contact night-coverage [email or web address]  08/01/2022, 12:20 PM

## 2022-08-02 ENCOUNTER — Inpatient Hospital Stay (HOSPITAL_COMMUNITY): Payer: Medicare PPO

## 2022-08-02 DIAGNOSIS — K5981 Ogilvie syndrome: Secondary | ICD-10-CM | POA: Diagnosis not present

## 2022-08-02 DIAGNOSIS — K56609 Unspecified intestinal obstruction, unspecified as to partial versus complete obstruction: Secondary | ICD-10-CM | POA: Diagnosis not present

## 2022-08-02 DIAGNOSIS — I2699 Other pulmonary embolism without acute cor pulmonale: Secondary | ICD-10-CM | POA: Diagnosis not present

## 2022-08-02 DIAGNOSIS — R197 Diarrhea, unspecified: Secondary | ICD-10-CM | POA: Diagnosis not present

## 2022-08-02 LAB — BASIC METABOLIC PANEL
Anion gap: 8 (ref 5–15)
Anion gap: 9 (ref 5–15)
BUN: 6 mg/dL — ABNORMAL LOW (ref 8–23)
BUN: 6 mg/dL — ABNORMAL LOW (ref 8–23)
CO2: 12 mmol/L — ABNORMAL LOW (ref 22–32)
CO2: 13 mmol/L — ABNORMAL LOW (ref 22–32)
Calcium: 6.6 mg/dL — ABNORMAL LOW (ref 8.9–10.3)
Calcium: 6.5 mg/dL — ABNORMAL LOW (ref 8.9–10.3)
Chloride: 121 mmol/L — ABNORMAL HIGH (ref 98–111)
Chloride: 118 mmol/L — ABNORMAL HIGH (ref 98–111)
Creatinine, Ser: 1.01 mg/dL (ref 0.61–1.24)
Creatinine, Ser: 1.13 mg/dL (ref 0.61–1.24)
GFR, Estimated: >60 mL/min (ref >60)
GFR, Estimated: >60 mL/min (ref >60)
Glucose, Bld: 91 mg/dL (ref 70–99)
Glucose, Bld: 121 mg/dL — ABNORMAL HIGH (ref 70–99)
Potassium: 2.8 mmol/L — ABNORMAL LOW (ref 3.5–5.1)
Potassium: 3.5 mmol/L (ref 3.5–5.1)
Sodium: 141 mmol/L (ref 135–145)
Sodium: 140 mmol/L (ref 135–145)

## 2022-08-02 LAB — CBC
HCT: 24.3 % — ABNORMAL LOW (ref 39.0–52.0)
Hemoglobin: 8.1 g/dL — ABNORMAL LOW (ref 13.0–17.0)
MCH: 31.8 pg (ref 26.0–34.0)
MCHC: 33.3 g/dL (ref 30.0–36.0)
MCV: 95.3 fL (ref 80.0–100.0)
Platelets: 217 10*3/uL (ref 150–400)
RBC: 2.55 MIL/uL — ABNORMAL LOW (ref 4.22–5.81)
RDW: 18.6 % — ABNORMAL HIGH (ref 11.5–15.5)
WBC: 3.9 10*3/uL — ABNORMAL LOW (ref 4.0–10.5)
nRBC: 0 % (ref 0.0–0.2)

## 2022-08-02 LAB — GLUCOSE, CAPILLARY
Glucose-Capillary: 78 mg/dL (ref 70–99)
Glucose-Capillary: 96 mg/dL (ref 70–99)
Glucose-Capillary: 107 mg/dL — ABNORMAL HIGH (ref 70–99)

## 2022-08-02 LAB — PHOSPHORUS
Phosphorus: <1 mg/dL — CL (ref 2.5–4.6)
Phosphorus: 1.8 mg/dL — ABNORMAL LOW (ref 2.5–4.6)

## 2022-08-02 LAB — MAGNESIUM: Magnesium: 1.6 mg/dL — ABNORMAL LOW (ref 1.7–2.4)

## 2022-08-02 MED ORDER — POTASSIUM PHOSPHATES 15 MMOLE/5ML IV SOLN
45.0000 mmol | Freq: Once | INTRAVENOUS | Status: AC
  Administered 2022-08-02: 45 mmol via INTRAVENOUS
  Filled 2022-08-02: qty 5

## 2022-08-02 MED ORDER — POTASSIUM CHLORIDE 10 MEQ/100ML IV SOLN
10.0000 meq | INTRAVENOUS | Status: AC
  Administered 2022-08-02 – 2022-08-03 (×3): 10 meq via INTRAVENOUS
  Filled 2022-08-02 (×4): qty 100

## 2022-08-02 MED ORDER — BOOST / RESOURCE BREEZE PO LIQD CUSTOM
1.0000 | Freq: Three times a day (TID) | ORAL | Status: DC
  Administered 2022-08-02: 1 via ORAL

## 2022-08-02 MED ORDER — ADULT MULTIVITAMIN W/MINERALS CH
1.0000 | ORAL_TABLET | Freq: Every day | ORAL | Status: DC
  Administered 2022-08-03 – 2022-08-15 (×12): 1 via ORAL
  Filled 2022-08-02 (×12): qty 1

## 2022-08-02 MED ORDER — POTASSIUM CHLORIDE 10 MEQ/100ML IV SOLN
10.0000 meq | INTRAVENOUS | Status: AC
  Administered 2022-08-02 (×5): 10 meq via INTRAVENOUS
  Filled 2022-08-02 (×5): qty 100

## 2022-08-02 MED ORDER — CHOLESTYRAMINE LIGHT 4 G PO PACK
4.0000 g | PACK | Freq: Every day | ORAL | Status: DC
  Administered 2022-08-03 – 2022-08-04 (×2): 4 g via ORAL
  Filled 2022-08-02 (×4): qty 1

## 2022-08-02 MED ORDER — POTASSIUM CHLORIDE 20 MEQ PO PACK
40.0000 meq | PACK | Freq: Two times a day (BID) | ORAL | Status: DC
  Administered 2022-08-02 – 2022-08-03 (×4): 40 meq via ORAL
  Filled 2022-08-02 (×5): qty 2

## 2022-08-02 MED ORDER — IPRATROPIUM-ALBUTEROL 0.5-2.5 (3) MG/3ML IN SOLN
3.0000 mL | Freq: Two times a day (BID) | RESPIRATORY_TRACT | Status: DC
  Administered 2022-08-02 – 2022-08-06 (×8): 3 mL via RESPIRATORY_TRACT
  Filled 2022-08-02 (×9): qty 3

## 2022-08-02 MED ORDER — MAGNESIUM SULFATE 2 GM/50ML IV SOLN
2.0000 g | Freq: Once | INTRAVENOUS | Status: AC
  Administered 2022-08-02: 2 g via INTRAVENOUS
  Filled 2022-08-02: qty 50

## 2022-08-02 NOTE — TOC Progression Note (Signed)
Transition of Care [facility]) - Progression Note    Patient Details  Name: Frank Hernandez MRN: [id number] Date of Birth: 06/08/1940  Transition of Care [facility]) CM/SW Contact  [name], [place] Phone Number: 08/02/2022, 12:12 PM  Clinical Narrative:  Per initial note patient recently finished HHPT with [facility] and spouse was hoping for HHPT. TOC spoke with patient's spouse, [name] regarding SNF vs HHPT. Peggy would like to wait and speak with the doctor before making a decision. She also reports that patient was in [facility] SNF early March and unsure if he  has any days left.   PLAN: TBD    Expected Discharge Plan: Home/Self Care Barriers to Discharge: Continued Medical Work up  Expected Discharge Plan and Services   Discharge Planning Services: CM Consult   Living arrangements for the past 2 months: Single Family Home                                       Social Determinants of Health (SDOH) Interventions SDOH Screenings   Food Insecurity: No Food Insecurity (07/30/2022)  Recent Concern: Food Insecurity - Food Insecurity Present (05/28/2022)  Housing: Low Risk  (07/30/2022)  Transportation Needs: No Transportation Needs (07/30/2022)  Recent Concern: Transportation Needs - Unmet Transportation Needs (05/28/2022)  Utilities: Not At Risk (07/30/2022)  Depression (PHQ2-9): Low Risk  (04/29/2022)  Tobacco Use: Low Risk  (07/29/2022)    Readmission Risk Interventions    04/14/2022    1:48 PM 04/02/2022   11:43 AM  Readmission Risk Prevention Plan  Post Dischage Appt  Complete  Medication Screening  Complete  Transportation Screening Complete Complete  PCP or Specialist Appt within 5-7 Days Complete   Home Care Screening Complete   Medication Review (RN CM) Complete

## 2022-08-02 NOTE — Progress Notes (Signed)
Initial Nutrition Assessment  DOCUMENTATION CODES:   Not applicable  INTERVENTION:  - DYS 1 diet per MD.  - Would recommend Boost Breeze po TID, each supplement provides 250 kcal and 9 grams of protein - Encourage intake as tolerated.  - Multivitamin with minerals daily - Monitor weight trends.   NUTRITION DIAGNOSIS:   Inadequate oral intake related to acute illness (small bowel obstruction) as evidenced by energy intake < 75% for > 7 days.  GOAL:   Patient will meet greater than or equal to 90% of their needs  MONITOR:   PO intake, Supplement acceptance, Diet advancement, Weight trends, Labs  REASON FOR ASSESSMENT:   Malnutrition Screening Tool    ASSESSMENT:   42- y.o male with PMH significant for HTN, prediabetes, stage IIIa CKD, multiple myeloma who presented due to wheezing and rhonchi. Admitted for small bowel obstruction.   5/30 Admit 5/31 Clear Liquids 6/1 Soft diet 6/2 Regular diet  Patient reports a UBW of 241# and weight loss since around February due to eating less. Per EMR, Weight without significant changes since February.   Shares his wife was cooking for him at home but he was not eating well due to poor appetite. Occasionally drinking Ensure but not every day.   Since admission he reports appetite is a little better but still not eating a lot. No intake documented to fully assess patient's oral intake.   He continues to have diarrhea, positive for enteropathogenic E coli and concern it could be related to chemo as well. GI recommending to stop Ensure however patient has not been ordered Ensure this admission. 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  Diet now downgraded to DYS 1.  Encouraged patient to try and consume something at all 3 meals daily. He endorses doing better with liquids and likes to drink juice so agreeable to try Boost Breeze.    Medications reviewed and include: -  Labs reviewed:  K+ 2.8 Phosphorus <  1.0 Magnesium 1.6   NUTRITION - FOCUSED PHYSICAL EXAM:  Flowsheet Row Most Recent Value  Orbital Region Mild depletion  Upper Arm Region No depletion  Thoracic and Lumbar Region No depletion  Buccal Region No depletion  Temple Region Mild depletion  Clavicle Bone Region No depletion  Clavicle and Acromion Bone Region No depletion  Scapular Bone Region Unable to assess  Dorsal Hand No depletion  Patellar Region No depletion  Anterior Thigh Region No depletion  Posterior Calf Region No depletion  Edema (RD Assessment) None  Hair Reviewed  Eyes Reviewed  Mouth Reviewed  [no teeth, doesn't wear dentures per pt report]  Skin Reviewed  Nails Reviewed       Diet Order:   Diet Order             DIET - DYS 1 Room service appropriate? Yes; Fluid consistency: Thin  Diet effective now                   EDUCATION NEEDS:  Education needs have been addressed  Skin:  Skin Assessment: Skin Integrity Issues: Skin Integrity Issues:: Stage III Stage III: Right heel  Last BM:  6/3 - fecal management system  Height:  Ht Readings from Last 1 Encounters:  07/29/22 5\' 9"  (1.753 m)   Weight:  Wt Readings from Last 1 Encounters:  07/29/22 90.3 kg    BMI:  Body mass index is 29.39 kg/m.  Estimated Nutritional Needs:  Kcal:  1900-2100 kcals Protein:  100-115 grams Fluid:  >/=  1.9L    Shelle Iron RD, LDN For contact information, refer to [facility].

## 2022-08-02 NOTE — Progress Notes (Addendum)
Progress Note  LOS: 4 days   Chief Complaint:Colonic distention, suspect Ogilvie's/pseudoobstruction   Subjective   Patient states his pain is the same as yesterday. Denies nausea/vomiting. Continuing to have multiple liquid bowel movements. Denies melena/hematochezia.   Objective   Vital signs in last 24 hours: Temp:  [97.9 F (36.6 C)-99 F (37.2 C)] 99 F (37.2 C) (06/03 0800) Pulse Rate:  [57-78] 57 (06/03 0600) Resp:  [11-20] 18 (06/03 0600) BP: (135-178)/(63-86) 146/72 (06/03 0600) SpO2:  [98 %-100 %] 100 % (06/03 0841) FiO2 (%):  [21 %] 21 % (06/02 2027) Last BM Date : 08/01/22 Last BM recorded by nurses in past 5 days Stool Type: Type 7 (Liquid consistency with no solid pieces) (08/01/2022  8:00 PM)  General:   male in no acute distress  Heart:  Regular rate and rhythm; no murmurs Pulm: Clear anteriorly; no wheezing Abdomen: soft, nondistended, normal bowel sounds in all quadrants. Nontender without guarding. No organomegaly appreciated. Extremities:  No edema Neurologic:  Alert and  oriented x4;  No focal deficits.  Psych:  Cooperative. Normal mood and affect.  Intake/Output from previous day: 06/02 0701 - 06/03 0700 In: 4085.9 [I.V.:2682.8; IV Piggyback:1403.1] Out: 3000 [Urine:1900; Stool:1100] Intake/Output this shift: No intake/output data recorded.  Studies/Results: No results found.  Lab Results: Recent Labs    07/31/22 0301 08/01/22 0304 08/02/22 0302  WBC 5.0 3.9* 3.9*  HGB 8.2* 8.0* 8.1*  HCT 24.0* 23.9* 24.3*  PLT 212 213 217   BMET Recent Labs    08/01/22 0304 08/01/22 1715 08/02/22 0302  NA 143 142 141  K 2.6* 3.3* 2.8*  CL 122* 120* 121*  CO2 12* 11* 12*  GLUCOSE 90 116* 91  BUN 9 7* 6*  CREATININE 1.11 1.07 1.01  CALCIUM 6.8* 6.9* 6.6*   LFT Recent Labs    08/01/22 0304  PROT 4.7*  ALBUMIN 2.5*  AST 22  ALT 9  ALKPHOS 68  BILITOT 0.8   PT/INR No results for input(s): "LABPROT", "INR" in the last 72  hours.   Scheduled Meds:  acyclovir  400 mg Oral QHS   azithromycin  500 mg Oral Daily   budesonide (PULMICORT) nebulizer solution  0.25 mg Nebulization BID   Chlorhexidine Gluconate Cloth  6 each Topical Daily   enoxaparin (LOVENOX) injection  1 mg/kg Subcutaneous Q12H   ipratropium-albuterol  3 mL Nebulization TID   leptospermum manuka honey  1 Application Topical Daily   pantoprazole (PROTONIX) IV  40 mg Intravenous Q24H   potassium chloride  40 mEq Oral BID   sodium bicarbonate  1,300 mg Oral TID   Continuous Infusions:  0.9 % NaCl with KCl 20 mEq / L 75 mL/hr at 08/02/22 0600    ceFAZolin (ANCEF) IV Stopped (08/02/22 0554)   potassium chloride 10 mEq (08/02/22 0832)   potassium PHOSPHATE IVPB (in mmol) 45 mmol (08/02/22 0981)      Patient profile:   82 year old male with multiple myeloma, currently receiving treatment, admitted with respiratory symptoms, found to have new pulmonary embolism, also with abdominal distention for several days and several months of diarrhea with incontinence, found to have significantly dilated colon. Initially, colonic dilation thought to be secondary to possible obstruction, possibly involving his large inguinal hernia, but given his ongoing passage of stool and lack of significant pain, obstruction was felt to be very unlikely.  Given the significant colonic dilation with minimal symptoms, a pseudoobstruction may be more likely.    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 No improvement in symptoms. Evaluated by General Surgery today who feels patient has pseudo-obstruction and hernias likely not the cause of colonic distention.   Diarrhea - on azithromycin - E. Coli on GI pathogen panel  PE - on lovenox    Plan:   - can advance diet as tolerated - encourage ambulation - patient is not showing clinical improvement, may need to consider  neostigmine(?) - await repeat abdominal xray - avoid narcotics, anticholinergics, and antimotility agents  Bayley M McMichael  08/02/2022, 9:31 AM     Attending physician's note   I have taken history, reviewed the chart and examined the patient. I performed a substantive portion of this encounter, including complete performance of at least one of the key components, in conjunction with the APP. I agree with the Advanced Practitioner's note, impression and recommendations.  Received detailed signout from Dr. Tomasa Rand.  Complex problem.  Very distressed with diarrhea  Profuse diarrhea with electrolyte abn- ?etiology (antibiotics, neg c diff/+EPEC, chemo- daratumunab, ensure) Ogilvie's syndrome/pseudoobstruction. PE on Lovenox Multiple myeloma  Plan: -Replace electrolytes. Keep K>4, Mg>2 -Stop azithromycin -Stop Ensure -Agree with swallowing eval -Trial of cholestyramine low-dose.  Can make distention worse.  Hence, will closely monitor -Serial x-ray KUB's -FS with Bx as last resort.  D/W pt and pt's wife in detail     Edman Circle, MD [facility] [phone]

## 2022-08-02 NOTE — Progress Notes (Signed)
Physical Therapy Treatment Patient Details Name: Frank Hernandez MRN: [id number] DOB: 26-Feb-1941 Today's Date: 08/02/2022   History of Present Illness 82 year old gentleman history of remote bladder cancer, pt underwent IM nail R femur d/t pathological fx +Bx on 04/17/22 hospitalized diagnosed with multiple myeloma, CKD, hypertension recently started on treatment for multiple myeloma and admitted 07/29/22 with respiratory symptoms, found to have new pulmonary embolism, also with abdominal distention for several days and several months of diarrhea with incontinence, found to have significantly dilated colon (per GI, Colonic distention, 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  Speach difficult to understand at times.  Lives home with Spouse.  Recently D/C from [facility] back to home.  Stated, he was walking "good" and even "getting out of the house" (store/restaurant).  Pt declines to return to [facility]/SNF and hopes to be able to go home after this admission. Assisted OOB required increased time and + 2 assist.  General bed mobility comments: assist with B LE as well as upper body.  Assist to complete scooting to EOB using bed pad.  Pt c/o EDEMA throughout which "makes it hard to move". General transfer comment: pt able to rise from elevated bed + 2 side by side assist for safety/multiple lines.  Pt stood twice for peri care/hygiene.  General Gait Details: distance limited to 5 feet due to fatigue/weakness.  Required + 2 asisst for safety such that recliner was following as well as multiple lines. Pt and Spouse "hope" he will be able to "come home" as this admission.   Recommendations for follow up therapy are one component of a multi-disciplinary discharge planning process, led by the attending physician.  Recommendations may be updated based on patient status, additional functional criteria and insurance  authorization.  Follow Up Recommendations  Can patient physically be transported by private vehicle: No    Assistance Recommended at Discharge Frequent or constant Supervision/Assistance  Patient can return home with the following A lot of help with walking and/or transfers;A lot of help with bathing/dressing/bathroom;Assistance with cooking/housework;Assist for transportation;Help with stairs or ramp for entrance   Equipment Recommendations  None recommended by PT    Recommendations for Other Services       Precautions / Restrictions Precautions Precautions: Fall Precaution Comments: Flexi Seal Restrictions Weight Bearing Restrictions: No Other Position/Activity Restrictions: enteric precautions     Mobility  Bed Mobility Overal bed mobility: Needs Assistance Bed Mobility: Supine to Sit     Supine to sit: Mod assist     General bed mobility comments: assist with B LE as well as upper body.  Assist to complete scooting to EOB using bed pad.  Pt c/o EDEMA throughout which "makes it hard to move".    Transfers Overall transfer level: Needs assistance Equipment used: Rolling walker (2 wheels) Transfers: Sit to/from Stand Sit to Stand: Min assist, Mod assist, +2 physical assistance, +2 safety/equipment           General transfer comment: pt able to rise from elevated bed + 2 side by side assist for safety/multiple lines.  Pt stood twice for peri care/hygiene.    Ambulation/Gait Ambulation/Gait assistance: Min assist, +2 safety/equipment Gait Distance (Feet): 5 Feet Assistive device: Rolling walker (2 wheels) Gait Pattern/deviations: Step-through pattern, Decreased stride length Gait velocity: decreased     General Gait Details: distance limited to 5 feet due to fatigue/weakness.  Required + 2 asisst  for safety such that recliner was following as well as multiple lines.   Stairs             Wheelchair Mobility    Modified Rankin (Stroke Patients  Only)       Balance                                            Cognition Arousal/Alertness: Awake/alert Behavior During Therapy: WFL for tasks assessed/performed Overall Cognitive Status: Within Functional Limits for tasks assessed                                 General Comments: AxO x 3 very pleasant.  Speach difficult to understand at times.  Lives home with Spouse.  Recently D/C from [facility] back to home.  Stated, he was walking "good" and even "getting out of the house" (store/restaurant).  Pt declines to return to [facility]/SNF and hopes to be able to go home after this admission.        Exercises      General Comments        Pertinent Vitals/Pain Pain Assessment Pain Assessment: Faces Faces Pain Scale: Hurts a little bit Pain Location: R heel/foot Pain Descriptors / Indicators: Tender, Sore Pain Intervention(s): Monitored during session, Repositioned    Home Living                          Prior Function            PT Goals (current goals can now be found in the care plan section) Progress towards PT goals: Progressing toward goals    Frequency    Min 1X/week      PT Plan Current plan remains appropriate    Co-evaluation              AM-PAC PT "6 Clicks" Mobility   Outcome Measure  Help needed turning from your back to your side while in a flat bed without using bedrails?: A Lot Help needed moving from lying on your back to sitting on the side of a flat bed without using bedrails?: A Lot Help needed moving to and from a bed to a chair (including a wheelchair)?: A Lot Help needed standing up from a chair using your arms (e.g., wheelchair or bedside chair)?: A Lot Help needed to walk in hospital room?: A Lot Help needed climbing 3-5 steps with a railing? : Total 6 Click Score: 11    End of Session Equipment Utilized During Treatment: Gait belt Activity Tolerance: Patient tolerated  treatment well Patient left: in chair;with call bell/phone within reach;with chair alarm set;with nursing/sitter in room Nurse Communication: Mobility status PT Visit Diagnosis: Difficulty in walking, not elsewhere classified (R26.2);Muscle weakness (generalized) (M62.81)     Time: 1610-9604 PT Time Calculation (min) (ACUTE ONLY): 25 min  Charges:  $Gait Training: 8-22 mins $Therapeutic Activity: 8-22 mins                     Felecia Shelling  PTA Acute  Rehabilitation Services Office M-F          [phone]

## 2022-08-02 NOTE — Progress Notes (Signed)
PROGRESS NOTE    Frank Hernandez  ZOX:[id number] DOB: 1940-05-13 DOA: 07/29/2022 PCP: [name] (Inactive)   Brief Narrative: 82 year old with past medical history significant for epistaxis, hypertension, prediabetes, stage IIIa CKD, pathological fracture of the right femoral, multiple myeloma who presents to the ED due to wheezing and rhonchi.  EMS gave albuterol and routes.  Patient has been taking antibiotics for cellulitis.  He has had worsening of his right foot wound.  He reports abdominal distention, he report small watery stool.  Evaluation in the ED chest x-ray no active cardiopulmonary disease.  CTA chest showed left lower lobe segmental pulmonary PE with overall minimal clot burden.  CT abdomen with dilated colon with air-fluid level seen and scattered colonic diverticula.  Transition seen in the area of the new left inguinal hernia involving the sigmoid colon.  No pneumatosis.  Small bowel and stomach are known dilated but there is an enteric tube.  Separate large known right inguinal hernia involving the small bowel and significant portion of the urinary bladder.  Lytic bone lesion consistent with known history of myeloma.  Patient admitted with PE and a small bowel obstruction.  Surgery and GI consulted. 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 Diarrhea thought to be related post Chemo.    Assessment & Plan:   Principal Problem:   Bowel obstruction (HCC) Active Problems:   Essential hypertension   Pre-diabetes   Hypokalemia   Chronic kidney disease, stage 3a (HCC)   Normocytic anemia   Multiple myeloma not having achieved remission (HCC)   Hypomagnesemia   Prolonged QT interval   Cellulitis of both lower extremities   Acute pulmonary embolism (HCC)   1-Acute Pulmonary Embolism, Acute DVT left femoral vein -CTA Chest: Left lower lobe segmental pulmonary embolism. Overall minimal clot burden. -Doppler LE: indings  consistent with acute deep vein thrombosis involving the left common femoral vein, and left femoral vein. Findings consistent with age indeterminate deep vein thrombosis  involving the left popliteal vein.  Continue with  Lovenox.  ECHO: RV function Normal.   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 Transition seen in the area of the new left inguinal hernia involving the sigmoid colon. Please correlate for reduce ability. No pneumatosis, free air or fluid. Small bowel and stomach are nondilated but there is an enteric tube. -NG tube  has been removed by General Surgery.  -He is having Multiples watery BM. -Diest advanced to regular.  Plan to repeat X ray tomorrow. Could consider Neostigmine if no improvement.  Not eating much.   3-Diarrhea; C diff negative. Having issues with Diarrhea since last hospitalization.  GI pathogen  positive for enteropathogenic E coli.  Started Azithro, completed 3 days.  Thought to be related to Chemo: Daratumumab.  Avoid Imodium due to Pseudo-obstruction Syndrome Follow up GI recommendation for management.   Colonic Distension:  GI consulted, think patient could have Ogilvie's/Pseudo-obstruction.  Avoid Imodium.  Could consider neostigmine if no clinical improvement. KUB pending.    4-Metabolic Acidosis: in setting of Diarrhea, dehydration.  Holding Bicarb Gtt due to hypokalemia.  Continue with Bicarb oral supplement.   5-Cellulitis of both lower extremity: Change Vancomycin to Doxy due to AKI.  Change cefepime to Ancef to avoid confusion. Plan to treat for total 5 days. Day 5  6-Hypomagnesemia: IV magnesium   7-Hypokalemia: Continue with IV Kcl supplement.  Repeat Bmet this afternoon.  Hypophosphatemia: Replete IV    Prolonged QT 2: Replete electrolytes. QT 450  Respiratory Distress; Wheezing.  Check  x ray negative for pulmonary edema. He has profuse diarrhea. Will continue with low rate IV fluids.  Continue with schedule nebulizer.   Hypertension: PRN Metoprolol.   Pre-Diabetes: On diet controlled.   AKI on CKD stage IIIa: Previous Cr 1.4--1.3 Presents with Cr 1.8.  Related to hypovolemia. Pre renal.  Improving with fluids.   Normocytic  anemia: anemia of malignancy MM Monitor hb.   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 PRN tylenol  Multiple myeloma Needs to follow up with oncology out patient.  See wound care documentation below.  Pressure Injury 03/29/22 Buttocks Right Stage 1 -  Intact skin with non-blanchable redness of a localized area usually over a bony prominence. (Active)  03/29/22 2140  Location: Buttocks  Location Orientation: Right  Staging: Stage 1 -  Intact skin with non-blanchable redness of a localized area usually over a bony prominence.  Wound Description (Comments):   Present on Admission: Yes     Pressure Injury 05/28/22 Heel Right Stage 3 -  Full thickness tissue loss. Subcutaneous fat may be visible but bone, tendon or muscle are NOT exposed. Open wound, broken from pressure & dry skin (Active)  05/28/22 1400  Location: Heel  Location Orientation: Right  Staging: Stage 3 -  Full thickness tissue loss. Subcutaneous fat may be visible but bone, tendon or muscle are NOT exposed.  Wound Description (Comments): Open wound, broken from pressure & dry skin  Present on Admission: Yes  Dressing Type Foam - Lift dressing to assess site every shift;Gauze (Comment);Honey 08/02/22 0531    Estimated body mass index is 29.39 kg/m as calculated from the following:   Height as of this encounter: 5\' 9"  (1.753 m).   Weight as of this encounter: 90.3 kg.   DVT prophylaxis: Lovenox Code Status: Full code Family Communication: Wife who was at bedside 6/01 Disposition Plan:  Status is:  Inpatient Remains inpatient appropriate because: management of medical problems, AKI, metabolic acidosis. PE    Consultants:  Surgery   Procedures:  ECHO  Antimicrobials:    Subjective: Not eating much. Denies abdominal pain. Continue to have watery Diarrhea.   Objective: Vitals:   08/02/22 0900 08/02/22 1000 08/02/22 1100 08/02/22 1200  BP: (!) 170/62 (!) 161/71 (!) 148/72   Pulse: 68 71 67   Resp: 17 17 15    Temp:    98.4 F (36.9 C)  TempSrc:    Oral  SpO2: 100% 99% 100%   Weight:      Height:        Intake/Output Summary (Last 24 hours) at 08/02/2022 1249 Last data filed at 08/02/2022 0800 Gross per 24 hour  Intake 2415.42 ml  Output 3045 ml  Net -629.58 ml    Filed Weights   07/29/22 1610  Weight: 90.3 kg    Examination:  General exam: NAD Respiratory system: CTA Cardiovascular system: S 1, S 2 RRR Gastrointestinal system: BS present, soft, nt Central nervous system: Alert, follows command Extremities: Right heel with open wound, coin size.    Data Reviewed: I have personally reviewed following labs and imaging studies  CBC: Recent Labs  Lab 07/29/22 0835 07/30/22 0316 07/31/22 0301 08/01/22 0304 08/02/22 0302  WBC 6.0 6.3 5.0 3.9* 3.9*  NEUTROABS 2.8  --   --   --   --   HGB 9.9* 9.0* 8.2* 8.0*  8.1*  HCT 29.8* 27.9* 24.0* 23.9* 24.3*  MCV 96.1 99.3 94.5 93.4 95.3  PLT 252 230 212 213 217    Basic Metabolic Panel: Recent Labs  Lab 07/29/22 0835 07/30/22 0316 07/30/22 1521 07/31/22 0301 07/31/22 1901 08/01/22 0304 08/01/22 1715 08/02/22 0302  NA 140 141   < > 142 143 143 142 141  K 3.3* 3.5   < > 2.4* 2.6* 2.6* 3.3* 2.8*  CL 119* 122*   < > 120* 121* 122* 120* 121*  CO2 12* 9*   < > 12* 13* 12* 11* 12*  GLUCOSE 94 99   < > 89 105* 90 116* 91  BUN 11 13   < > 12 10 9  7* 6*  CREATININE 1.83* 1.54*   < > 1.29* 1.27* 1.11 1.07 1.01  CALCIUM 6.9* 6.7*   < > 6.7* 7.0* 6.8* 6.9* 6.6*  MG 1.4* 2.0  --  1.5*  --  1.7  --  1.6*  PHOS   --   --   --   --   --   --   --  <1.0*   < > = values in this interval not displayed.    GFR: Estimated Creatinine Clearance: 63.7 mL/min (by C-G formula based on SCr of 1.01 mg/dL). Liver Function Tests: Recent Labs  Lab 07/30/22 0316 07/31/22 0301 08/01/22 0304  AST 24 24 22   ALT 12 13 9   ALKPHOS 85 80 68  BILITOT 1.0 0.5 0.8  PROT 5.5* 4.8* 4.7*  ALBUMIN 3.0* 2.7* 2.5*    No results for input(s): "LIPASE", "AMYLASE" in the last 168 hours. No results for input(s): "AMMONIA" in the last 168 hours. Coagulation Profile: No results for input(s): "INR", "PROTIME" in the last 168 hours. Cardiac Enzymes: No results for input(s): "CKTOTAL", "CKMB", "CKMBINDEX", "TROPONINI" in the last 168 hours. BNP (last 3 results) No results for input(s): "PROBNP" in the last 8760 hours. HbA1C: No results for input(s): "HGBA1C" in the last 72 hours. CBG: Recent Labs  Lab 08/01/22 1108 08/01/22 1753 08/01/22 2321 08/02/22 0532 08/02/22 1148  GLUCAP 95 133* 115* 78 96    Lipid Profile: No results for input(s): "CHOL", "HDL", "LDLCALC", "TRIG", "CHOLHDL", "LDLDIRECT" in the last 72 hours. Thyroid Function Tests: No results for input(s): "TSH", "T4TOTAL", "FREET4", "T3FREE", "THYROIDAB" in the last 72 hours. Anemia Panel: No results for input(s): "VITAMINB12", "FOLATE", "FERRITIN", "TIBC", "IRON", "RETICCTPCT" in the last 72 hours. Sepsis Labs: Recent Labs  Lab 07/29/22 0918 07/29/22 1150  LATICACIDVEN 2.1* 1.8     Recent Results (from the past 240 hour(s))  SARS Coronavirus 2 by RT PCR (hospital order, performed in [facility] hospital lab) *cepheid single result test* Anterior Nasal Swab     Status: None   Collection Time: 07/29/22  8:26 AM   Specimen: Anterior Nasal Swab  Result Value Ref Range Status   SARS Coronavirus 2 by RT PCR NEGATIVE NEGATIVE Final    Comment: (NOTE) SARS-CoV-2 target nucleic acids are NOT DETECTED.  The SARS-CoV-2 RNA is generally detectable in  upper and lower respiratory specimens during the acute phase of infection. The lowest concentration of SARS-CoV-2 viral copies this assay can detect is 250 copies / mL. A negative result does not preclude SARS-CoV-2 infection and should not be used as the sole basis for treatment or other patient management decisions.  A negative result may occur with improper specimen collection / handling, submission of specimen other than nasopharyngeal swab, presence of viral mutation(s) within the areas  targeted by this assay, and inadequate number of viral copies (<250 copies / mL). A negative result must be combined with clinical observations, patient history, and epidemiological information.  Fact Sheet for Patients:   RoadLapTop.co.za  Fact Sheet for Healthcare Providers: http://kim-miller.com/  This test is not yet approved or  cleared by the [place] FDA and has been authorized for detection and/or diagnosis of SARS-CoV-2 by FDA under an Emergency Use Authorization (EUA).  This EUA will remain in effect (meaning this test can be used) for the duration of the COVID-19 declaration under Section 564(b)(1) of the Act, 21 U.S.C. section 360bbb-3(b)(1), unless the authorization is terminated or revoked sooner.  Performed at [facility], [address], [place], [place] [address]   Blood culture (routine x 2)     Status: None (Preliminary result)   Collection Time: 07/29/22  9:19 AM   Specimen: BLOOD  Result Value Ref Range Status   Specimen Description   Final    BLOOD PERIPHERAL Performed at [facility], [address], [place], [place] [address]    Special Requests   Final    BOTTLES DRAWN AEROBIC AND ANAEROBIC Blood Culture adequate volume Performed at [facility], [address], [place], [place] [address]    Culture   Final    NO GROWTH 4 DAYS Performed at [facility], [address] [place], [place] [address]    Report Status PENDING  Incomplete  Blood culture (routine x 2)     Status: None (Preliminary result)   Collection Time: 07/29/22  9:22 AM   Specimen: BLOOD  Result Value Ref Range Status   Specimen Description   Final    BLOOD RIGHT ANTECUBITAL Performed at [facility], [address], [place], [place] [address]    Special Requests   Final    BOTTLES DRAWN AEROBIC AND ANAEROBIC Blood Culture adequate volume Performed at [facility], [address], [place], [place] [address]    Culture   Final    NO GROWTH 4 DAYS Performed at [facility], [address], [place], [place] [address]    Report Status PENDING  Incomplete  MRSA Next Gen by PCR, Nasal     Status: None   Collection Time: 07/29/22  3:21 PM   Specimen: Nasal Mucosa; Nasal Swab  Result Value Ref Range Status   MRSA by PCR Next Gen NOT DETECTED NOT DETECTED Final    Comment: (NOTE) The GeneXpert MRSA Assay (FDA approved for NASAL specimens only), is one component of a comprehensive MRSA colonization surveillance program. It is not intended to diagnose MRSA infection nor to guide or monitor treatment for MRSA infections. Test performance is not FDA approved in patients less than 33 years old. Performed at [facility], [address], [place], [place] [address]   C Difficile Quick Screen w PCR reflex     Status: None   Collection Time: 07/30/22  1:46 PM   Specimen: STOOL  Result Value Ref Range Status   C Diff antigen NEGATIVE NEGATIVE Final   C Diff toxin NEGATIVE NEGATIVE Final   C Diff interpretation No C. difficile detected.  Final    Comment: Performed at [facility], [address] [place], [place] [address]  Gastrointestinal Panel by PCR , Stool     Status: Abnormal   Collection Time: 07/30/22  1:46 PM   Specimen: Stool  Result Value  Ref Range Status   Campylobacter  species NOT DETECTED NOT DETECTED Final   Plesimonas shigelloides NOT DETECTED NOT DETECTED Final   Salmonella species NOT DETECTED NOT DETECTED Final   Yersinia enterocolitica NOT DETECTED NOT DETECTED Final   Vibrio species NOT DETECTED NOT DETECTED Final   Vibrio cholerae NOT DETECTED NOT DETECTED Final   Enteroaggregative E coli (EAEC) NOT DETECTED NOT DETECTED Final   Enteropathogenic E coli (EPEC) DETECTED (A) NOT DETECTED Final    Comment: RESULT CALLED TO, READ BACK BY AND VERIFIED WITH:  ISABELLE URQIJO 07/31/2022 1420 CP    Enterotoxigenic E coli (ETEC) NOT DETECTED NOT DETECTED Final   Shiga like toxin producing E coli (STEC) NOT DETECTED NOT DETECTED Final   Shigella/Enteroinvasive E coli (EIEC) NOT DETECTED NOT DETECTED Final   Cryptosporidium NOT DETECTED NOT DETECTED Final   Cyclospora cayetanensis NOT DETECTED NOT DETECTED Final   Entamoeba histolytica NOT DETECTED NOT DETECTED Final   Giardia lamblia NOT DETECTED NOT DETECTED Final   Adenovirus F40/41 NOT DETECTED NOT DETECTED Final   Astrovirus NOT DETECTED NOT DETECTED Final   Norovirus GI/GII NOT DETECTED NOT DETECTED Final   Rotavirus A NOT DETECTED NOT DETECTED Final   Sapovirus (I, II, IV, and V) NOT DETECTED NOT DETECTED Final    Comment: Performed at [facility], [address] [place], [place] [address]  OVA + PARASITE EXAM     Status: None   Collection Time: 07/31/22  8:55 AM   Specimen: Stool  Result Value Ref Range Status   OVA + PARASITE EXAM WRORD  Final    Comment: (NOTE) Test not performed. The required specimen for the test ordered was not received. Notified Rozelle Logan Test requires P/P Pink and Wallace Cullens; Received RT Stool These results were obtained using wet preparation(s) and trichrome stained smear. This test does not include testing for Cryptosporidium parvum, Cyclospora, or Microsporidia. Performed At: [facility] [address] [place], [place] [id number] Jolene Schimke  MD XB:[phone]    Source of Sample STOOL  Final    Comment: Performed at [facility], [address], [place], [place] [address]         Radiology Studies: No results found.      Scheduled Meds:  acyclovir  400 mg Oral QHS   budesonide (PULMICORT) nebulizer solution  0.25 mg Nebulization BID   Chlorhexidine Gluconate Cloth  6 each Topical Daily   enoxaparin (LOVENOX) injection  1 mg/kg Subcutaneous Q12H   feeding supplement  1 Container Oral TID BM   ipratropium-albuterol  3 mL Nebulization TID   leptospermum manuka honey  1 Application Topical Daily   pantoprazole (PROTONIX) IV  40 mg Intravenous Q24H   potassium chloride  40 mEq Oral BID   sodium bicarbonate  1,300 mg Oral TID   Continuous Infusions:  0.9 % NaCl with KCl 20 mEq / L 75 mL/hr at 08/02/22 0600    ceFAZolin (ANCEF) IV Stopped (08/02/22 0554)   potassium PHOSPHATE IVPB (in mmol) 45 mmol (08/02/22 0833)     LOS: 4 days    Time spent: 35 minutes.     Alba Cory, MD [facility]   If 7PM-7AM, please contact night-coverage [email or web address]  08/02/2022, 12:49 PM

## 2022-08-02 NOTE — Progress Notes (Addendum)
Subjective/Chief Complaint: Large volume diarrhea, states no ab pain   Objective: Vital signs in last 24 hours: Temp:  [97.9 F (36.6 C)-98.5 F (36.9 C)] 98.5 F (36.9 C) (06/03 0400) Pulse Rate:  [57-78] 57 (06/03 0600) Resp:  [11-20] 18 (06/03 0600) BP: (135-178)/(63-86) 146/72 (06/03 0600) SpO2:  [98 %-100 %] 99 % (06/03 0600) FiO2 (%):  [21 %] 21 % (06/02 2027) Last BM Date : 08/01/22  Intake/Output from previous day: 06/02 0701 - 06/03 0700 In: 4085.9 [I.V.:2682.8; IV Piggyback:1403.1] Out: 3000 [Urine:1900; Stool:1100] Intake/Output this shift: No intake/output data recorded.  Ab soft nontender right IH chronically incarcerated nontender, left is reducible and nontender   Lab Results:  Recent Labs    08/01/22 0304 08/02/22 0302  WBC 3.9* 3.9*  HGB 8.0* 8.1*  HCT 23.9* 24.3*  PLT 213 217   BMET Recent Labs    08/01/22 1715 08/02/22 0302  NA 142 141  K 3.3* 2.8*  CL 120* 121*  CO2 11* 12*  GLUCOSE 116* 91  BUN 7* 6*  CREATININE 1.07 1.01  CALCIUM 6.9* 6.6*   PT/INR No results for input(s): "LABPROT", "INR" in the last 72 hours. ABG Recent Labs    07/30/22 2306  HCO3 13.4*    Studies/Results: No results found.  Anti-infectives: Anti-infectives (From admission, onward)    Start     Dose/Rate Route Frequency Ordered Stop   08/01/22 1400  ceFAZolin (ANCEF) IVPB 2g/100 mL premix        2 g 200 mL/hr over 30 Minutes Intravenous Every 8 hours 08/01/22 0824 08/03/22 1359   07/31/22 1600  azithromycin (ZITHROMAX) tablet 500 mg        500 mg Oral Daily 07/31/22 1446 08/03/22 0959   07/31/22 1000  vancomycin (VANCOREADY) IVPB 1500 mg/300 mL  Status:  Discontinued        1,500 mg 150 mL/hr over 120 Minutes Intravenous Every 48 hours 07/29/22 1243 07/30/22 0755   07/30/22 2200  acyclovir (ZOVIRAX) tablet 400 mg        400 mg Oral Daily at bedtime 07/30/22 1843     07/30/22 1400  ceFAZolin (ANCEF) IVPB 2g/100 mL premix  Status:  Discontinued         2 g 200 mL/hr over 30 Minutes Intravenous Every 8 hours 07/30/22 0843 08/01/22 0824   07/30/22 0900  doxycycline (VIBRAMYCIN) 100 mg in sodium chloride 0.9 % 250 mL IVPB  Status:  Discontinued        100 mg 125 mL/hr over 120 Minutes Intravenous Every 12 hours 07/30/22 0809 07/31/22 1445   07/29/22 1800  ceFEPIme (MAXIPIME) 2 g in sodium chloride 0.9 % 100 mL IVPB  Status:  Discontinued        2 g 200 mL/hr over 30 Minutes Intravenous Every 12 hours 07/29/22 1234 07/30/22 0841   07/29/22 1230  metroNIDAZOLE (FLAGYL) IVPB 500 mg  Status:  Discontinued        500 mg 100 mL/hr over 60 Minutes Intravenous Every 12 hours 07/29/22 1205 07/30/22 0909   07/29/22 0930  piperacillin-tazobactam (ZOSYN) IVPB 3.375 g        3.375 g 100 mL/hr over 30 Minutes Intravenous  Once 07/29/22 0920 07/29/22 1038   07/29/22 0930  vancomycin (VANCOREADY) IVPB 2000 mg/400 mL        2,000 mg 200 mL/hr over 120 Minutes Intravenous  Once 07/29/22 0923 07/29/22 1246       Assessment/Plan: MM, new subsegmental PE ? 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

## 2022-08-02 NOTE — Evaluation (Signed)
Clinical/Bedside Swallow Evaluation Patient Details  Name: Frank Hernandez MRN: [id number] Date of Birth: [birth year]  Today's Date: 08/02/2022 Time: SLP Start Time (ACUTE ONLY): 1500 SLP Stop Time (ACUTE ONLY): 1520 SLP Time Calculation (min) (ACUTE ONLY): 20 min  Past Medical History:  Past Medical History:  Diagnosis Date   Bleeding from the nose    Hypertension    Pre-diabetes    Past Surgical History:  Past Surgical History:  Procedure Laterality Date   COLONOSCOPY     INTRAMEDULLARY (IM) NAIL INTERTROCHANTERIC Left 06/11/2020   Procedure: INTRAMEDULLARY (IM) NAIL INTERTROCHANTRIC;  Surgeon: Roby Lofts, MD;  Location: [facility] OR;  Service: Orthopedics;  Laterality: Left;   INTRAMEDULLARY (IM) NAIL INTERTROCHANTERIC Right 04/17/2022   Procedure: INTRAMEDULLARY (IM) NAIL INTERTROCHANTERIC;  Surgeon: London Sheer, MD;  Location: [facility];  Service: Orthopedics;  Laterality: Right;   KNEE ARTHROSCOPY Bilateral    POLYPECTOMY     HPI:  Patient is an 82 y.o. old male with PMH: epistaxis, hypertension, prediabetes, stage IIIa CKD, pathological fracture of the right femoral, multiple myeloma who presents to the ED due to wheezing and rhonchi. He reported abdominal distension and watery stool. ED work up included: CXR not showing active cardiopulmonary disease, CTA chest showing left LL segmental pulmonary PE with minimal clot burden, CT abdomen showed dilated colon with air-fluid level seen and scattered colonic diverticula, separate large known right inguinal hernia involving small bowel and significant portion of urinary bladder. Patient admitted with PE and SBO. Surgery consulted, who feels that patient has a pseudo-obstruction. RN reports patient unable to swallow whole pills but able to swallow water.    Assessment / Plan / Recommendation  Clinical Impression  Patient presents with a questionable oropharyngeal dysphagia as per this bedside swallow evaluation. SLP observed him with  PO's of two spoonfuls of applesauce, several sips of water and one bite of gelatin. When drinking water, no overt s/s and swallow initiation appears timely. With first bite of applesauce, patient grimaced but swallowed it without observed difficulty but with second bite, he again grimaced, but this time, held applesauce in anterior portion of oral cavity and did not swallow, indicating he needed to spit it out, which he did. With bite of gelatin, he masticated it fully until it was likely dissolved before he could swallow it. 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 He denied having loss of appetite, saying "I'm hungry all the time", and he denied any change in his taste sensation with PO's. He did tell SLP, "when I get that second bite, I don't want it." His spouse reports that he has not been eating harly any solids for past four weeks and during this time, she has also noticed that his speech has declined and it "seems like his tongue isn't moving and its staying at the bottom of his mouth when talking." Difficult to determine what is causing his issues but he seems to be having an aversion to PO's. SLP will continue to follow. SLP Visit Diagnosis: Dysphagia, unspecified (R13.10)    Aspiration Risk  No limitations;Mild aspiration risk    Diet Recommendation Thin liquid;Dysphagia 1 (Puree)   Liquid Administration via: Cup;Straw Medication Administration: Other (Comment) (as tolerated) Supervision: Patient able to self feed Compensations: Follow solids with liquid Postural Changes: Seated upright at 90 degrees    Other  Recommendations Oral Care Recommendations:  Oral care BID    Recommendations for follow up therapy are one component of a multi-disciplinary discharge planning process, led by the attending physician.  Recommendations may be updated based on patient  status, additional functional criteria and insurance authorization.  Follow up Recommendations Other (comment) (TBD)      Assistance Recommended at Discharge    Functional Status Assessment Patient has had a recent decline in their functional status and demonstrates the ability to make significant improvements in function in a reasonable and predictable amount of time.  Frequency and Duration min 2x/week  1 week       Prognosis Prognosis for improved oropharyngeal function: Fair      Swallow Study   General Date of Onset: 08/02/22 HPI: Patient is an 82 y.o. old male with PMH: epistaxis, hypertension, prediabetes, stage IIIa CKD, pathological fracture of the right femoral, multiple myeloma who presents to the ED due to wheezing and rhonchi. He reported abdominal distension and watery stool. ED work up included: CXR not showing active cardiopulmonary disease, CTA chest showing left LL segmental pulmonary PE with minimal clot burden, CT abdomen showed dilated colon with air-fluid level seen and scattered colonic diverticula, separate large known right inguinal hernia involving small bowel and significant portion of urinary bladder. Patient admitted with PE and SBO. Surgery consulted, who feels that patient has a pseudo-obstruction. RN reports patient unable to swallow whole pills but able to swallow water. Type of Study: Bedside Swallow Evaluation Previous Swallow Assessment: none found Diet Prior to this Study: Dysphagia 1 (pureed);Thin liquids (Level 0) Temperature Spikes Noted: No Respiratory Status: Room air History of Recent Intubation: No Behavior/Cognition: Alert;Cooperative;Pleasant mood Oral Cavity Assessment: Within Functional Limits Oral Care Completed by SLP: No Oral Cavity - Dentition: Edentulous Vision: Functional for self-feeding Self-Feeding Abilities: Able to feed self Patient Positioning: Upright in chair Baseline Vocal Quality: Normal Volitional Cough: Cognitively  unable to elicit Volitional Swallow: Able to elicit    Oral/Motor/Sensory Function Overall Oral Motor/Sensory Function: Within functional limits   Ice Chips     Thin Liquid Thin Liquid: Within functional limits Presentation: Straw;Self Fed    Nectar Thick     Honey Thick     Puree Puree: Impaired Presentation: Spoon Oral Phase Functional Implications: Oral holding Pharyngeal Phase Impairments: Other (comments) (no observed s/s) Other Comments: patient grimaced but swallowed first spoonful of applesauce without difficulty but with second spoonful he grimaced, held it in his mouth and had to spit it out   Solid     Solid: Not tested      Angela Nevin, MA, CCC-SLP Speech Therapy

## 2022-08-03 DIAGNOSIS — I2699 Other pulmonary embolism without acute cor pulmonale: Secondary | ICD-10-CM | POA: Diagnosis not present

## 2022-08-03 DIAGNOSIS — C9000 Multiple myeloma not having achieved remission: Secondary | ICD-10-CM | POA: Diagnosis not present

## 2022-08-03 DIAGNOSIS — R197 Diarrhea, unspecified: Secondary | ICD-10-CM | POA: Diagnosis not present

## 2022-08-03 DIAGNOSIS — K5981 Ogilvie syndrome: Secondary | ICD-10-CM | POA: Diagnosis not present

## 2022-08-03 DIAGNOSIS — K56609 Unspecified intestinal obstruction, unspecified as to partial versus complete obstruction: Secondary | ICD-10-CM | POA: Diagnosis not present

## 2022-08-03 DIAGNOSIS — R0989 Other specified symptoms and signs involving the circulatory and respiratory systems: Secondary | ICD-10-CM

## 2022-08-03 HISTORY — DX: Ogilvie syndrome: K59.81

## 2022-08-03 LAB — CBC
HCT: 25.5 % — ABNORMAL LOW (ref 39.0–52.0)
Hemoglobin: 8.7 g/dL — ABNORMAL LOW (ref 13.0–17.0)
MCH: 31.5 pg (ref 26.0–34.0)
MCHC: 34.1 g/dL (ref 30.0–36.0)
MCV: 92.4 fL (ref 80.0–100.0)
Platelets: 194 10*3/uL (ref 150–400)
RBC: 2.76 MIL/uL — ABNORMAL LOW (ref 4.22–5.81)
RDW: 18.1 % — ABNORMAL HIGH (ref 11.5–15.5)
WBC: 4.3 10*3/uL (ref 4.0–10.5)
nRBC: 0 % (ref 0.0–0.2)

## 2022-08-03 LAB — GLUCOSE, CAPILLARY
Glucose-Capillary: 89 mg/dL (ref 70–99)
Glucose-Capillary: 89 mg/dL (ref 70–99)
Glucose-Capillary: 86 mg/dL (ref 70–99)
Glucose-Capillary: 96 mg/dL (ref 70–99)
Glucose-Capillary: 106 mg/dL — ABNORMAL HIGH (ref 70–99)

## 2022-08-03 LAB — BASIC METABOLIC PANEL
Anion gap: 7 (ref 5–15)
Anion gap: 8 (ref 5–15)
BUN: <5 mg/dL — ABNORMAL LOW (ref 8–23)
BUN: <5 mg/dL — ABNORMAL LOW (ref 8–23)
CO2: 14 mmol/L — ABNORMAL LOW (ref 22–32)
CO2: 15 mmol/L — ABNORMAL LOW (ref 22–32)
Calcium: 6.3 mg/dL — CL (ref 8.9–10.3)
Calcium: 6.2 mg/dL — CL (ref 8.9–10.3)
Chloride: 121 mmol/L — ABNORMAL HIGH (ref 98–111)
Chloride: 117 mmol/L — ABNORMAL HIGH (ref 98–111)
Creatinine, Ser: 1.01 mg/dL (ref 0.61–1.24)
Creatinine, Ser: 0.93 mg/dL (ref 0.61–1.24)
GFR, Estimated: >60 mL/min (ref >60)
GFR, Estimated: >60 mL/min (ref >60)
Glucose, Bld: 95 mg/dL (ref 70–99)
Glucose, Bld: 94 mg/dL (ref 70–99)
Potassium: 3.1 mmol/L — ABNORMAL LOW (ref 3.5–5.1)
Potassium: 3.4 mmol/L — ABNORMAL LOW (ref 3.5–5.1)
Sodium: 142 mmol/L (ref 135–145)
Sodium: 140 mmol/L (ref 135–145)

## 2022-08-03 LAB — MAGNESIUM: Magnesium: 1.6 mg/dL — ABNORMAL LOW (ref 1.7–2.4)

## 2022-08-03 LAB — CULTURE, BLOOD (ROUTINE X 2)
Culture: NO GROWTH
Special Requests: ADEQUATE

## 2022-08-03 LAB — PHOSPHORUS: Phosphorus: 1.4 mg/dL — ABNORMAL LOW (ref 2.5–4.6)
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
## 2022-08-03 MED ORDER — POTASSIUM CHLORIDE 10 MEQ/100ML IV SOLN
10.0000 meq | INTRAVENOUS | Status: AC
  Administered 2022-08-03 – 2022-08-04 (×4): 10 meq via INTRAVENOUS
  Filled 2022-08-03 (×4): qty 100

## 2022-08-03 MED ORDER — POTASSIUM CHLORIDE 10 MEQ/100ML IV SOLN
10.0000 meq | INTRAVENOUS | Status: AC
  Administered 2022-08-03 (×4): 10 meq via INTRAVENOUS
  Filled 2022-08-03 (×4): qty 100

## 2022-08-03 MED ORDER — PANTOPRAZOLE SODIUM 40 MG IV SOLR
40.0000 mg | Freq: Two times a day (BID) | INTRAVENOUS | Status: DC
  Administered 2022-08-03 – 2022-08-12 (×19): 40 mg via INTRAVENOUS
  Filled 2022-08-03 (×19): qty 10

## 2022-08-03 MED ORDER — CALCIUM GLUCONATE-NACL 1-0.675 GM/50ML-% IV SOLN
1.0000 g | Freq: Once | INTRAVENOUS | Status: AC
  Administered 2022-08-03: 1000 mg via INTRAVENOUS
  Filled 2022-08-03: qty 50

## 2022-08-03 MED ORDER — POTASSIUM PHOSPHATES 15 MMOLE/5ML IV SOLN
45.0000 mmol | Freq: Once | INTRAVENOUS | Status: AC
  Administered 2022-08-03: 45 mmol via INTRAVENOUS
  Filled 2022-08-03: qty 15

## 2022-08-03 MED ORDER — MAGNESIUM SULFATE 2 GM/50ML IV SOLN
2.0000 g | Freq: Once | INTRAVENOUS | Status: AC
  Administered 2022-08-03: 2 g via INTRAVENOUS
  Filled 2022-08-03: qty 50

## 2022-08-03 NOTE — TOC Progression Note (Signed)
Transition of Care [facility]) - Progression Note   Patient Details  Name: [name] MRN: [id number] Date of Birth: 30-Jul-1940  [facility] CM/SW Contact  Ewing Schlein, LCSW Phone Number: 08/03/2022, 11:21 AM  Clinical Narrative: CSW followed up with Kia with [facility] regarding patient's recent SNF admission and discharge. Per Kia, patient discharged on 05/24/22 but may be in copay days due to the length of time patient was in rehab. Facility is willing to set up a payment plan if needed. CSW completed chart review and patient was admitted to the hospital between discharge from SNF and this admission, so patient has not had 60 days of wellness and will be in copay days. CSW spoke with patient's wife regarding SNF vs. HH. Per wife, she does not want to make a decision on SNF or HH until she has spoken with the physician. Wife aware patient will be in copay days. CSW updated hospitalist.  Expected Discharge Plan: Home/Self Care Barriers to Discharge: Continued Medical Work up  Expected Discharge Plan and Services Discharge Planning Services: CM Consult Living arrangements for the past 2 months: Single Family Home  Social Determinants of Health (SDOH) Interventions SDOH Screenings   Food Insecurity: No Food Insecurity (07/30/2022)  Recent Concern: Food Insecurity - Food Insecurity Present (05/28/2022)  Housing: Low Risk  (07/30/2022)  Transportation Needs: No Transportation Needs (07/30/2022)  Recent Concern: Transportation Needs - Unmet Transportation Needs (05/28/2022)  Utilities: Not At Risk (07/30/2022)  Depression (PHQ2-9): Low Risk  (04/29/2022)  Tobacco Use: Low Risk  (07/29/2022)   Readmission Risk Interventions    08/03/2022    8:39 AM 04/14/2022    1:48 PM 04/02/2022   11:43 AM  Readmission Risk Prevention Plan  Post Dischage Appt   Complete  Medication Screening   Complete  Transportation Screening Complete Complete Complete  PCP or Specialist Appt within 5-7 Days   Complete   Home Care Screening  Complete   Medication Review (RN CM)  Complete   Medication Review (RN Care Manager) Complete    HRI or Home Care Consult Complete    SW Recovery Care/Counseling Consult Complete    Palliative Care Screening Not Applicable    Skilled Nursing Facility Complete

## 2022-08-03 NOTE — Progress Notes (Addendum)
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 Continuing to have diarrhea. Trial of soft diet was unsuccessful. Patient states it makes him "feel bad" and had some nausea with eating.    Objective   Vital signs in last 24 hours: Temp:  [97.5 F (36.4 C)-99.4 F (37.4 C)] 98.6 F (37 C) (06/04 0800) Pulse Rate:  [67-79] 79 (06/03 1800) Resp:  [15-25] 25 (06/03 1800) BP: (148-172)/(62-83) 172/63 (06/03 1800) SpO2:  [98 %-100 %] 100 % (06/03 1935) Last BM Date : 08/02/22 Last BM recorded by nurses in past 5 days Stool Type: Type 7 (Liquid consistency with no solid pieces) (08/02/2022  9:30 PM)  General:   male in no acute distress, receiving NEB treatment per respiratory therapy Heart:  Regular rate and rhythm; no murmurs Pulm: Clear anteriorly; no wheezing Abdomen: distended, hypoactive bowel sounds, soft, nontender Extremities:  No edema Neurologic:  Alert and  oriented x4;  No focal deficits.  Psych:  Cooperative. Normal mood and affect.  Intake/Output from previous day: 06/03 0701 - 06/04 0700 In: 498 [IV Piggyback:498] Out: 2145 [Urine:600; Stool:1545] Intake/Output this shift: Total I/O In: 100 [IV Piggyback:100] Out: 900 [Stool:900]  Studies/Results: DG Abd 1 View  Result Date: 08/02/2022 CLINICAL DATA:  Evaluate ileus EXAM: ABDOMEN - 1 VIEW COMPARISON:  07/30/2022 FINDINGS: Persistent gaseous dilatation of the colon is noted. Cecum is dilated to 10.6 cm. No free air is seen. Postsurgical changes in the proximal femurs are seen. IMPRESSION: Gaseous dilatation of the colon most consistent with a colonic ileus. Electronically Signed   By: Alcide Clever M.D.   On: 08/02/2022 19:56    Lab Results: Recent Labs    08/01/22 0304 08/02/22 0302  WBC 3.9* 3.9*  HGB 8.0* 8.1*  HCT 23.9* 24.3*  PLT 213 217   BMET Recent Labs    08/01/22 1715  08/02/22 0302 08/02/22 1621  NA 142 141 140  K 3.3* 2.8* 3.5  CL 120* 121* 118*  CO2 11* 12* 13*  GLUCOSE 116* 91 121*  BUN 7* 6* 6*  CREATININE 1.07 1.01 1.13  CALCIUM 6.9* 6.6* 6.5*   LFT Recent Labs    08/01/22 0304  PROT 4.7*  ALBUMIN 2.5*  AST 22  ALT 9  ALKPHOS 68  BILITOT 0.8   PT/INR No results for input(s): "LABPROT", "INR" in the last 72 hours.   Scheduled Meds:  acyclovir  400 mg Oral QHS   budesonide (PULMICORT) nebulizer solution  0.25 mg Nebulization BID   Chlorhexidine Gluconate Cloth  6 each Topical Daily   cholestyramine light  4 g Oral Daily   enoxaparin (LOVENOX) injection  1 mg/kg Subcutaneous Q12H   ipratropium-albuterol  3 mL Nebulization BID   leptospermum manuka honey  1 Application Topical Daily   multivitamin with minerals  1 tablet Oral Daily   pantoprazole (PROTONIX) IV  40 mg Intravenous Q24H   potassium chloride  40 mEq Oral BID   sodium bicarbonate  1,300 mg Oral TID   Continuous Infusions:  0.9 % NaCl with KCl 20 mEq / L 75 mL/hr at 08/03/22 0736      Patient profile:   82 year old male with multiple myeloma, currently receiving treatment, admitted with respiratory symptoms, found to have new pulmonary embolism, also with abdominal distention for several days and several months of diarrhea with incontinence, found to have significantly dilated colon. Initially,  colonic dilation thought to be secondary to possible obstruction, possibly involving his large inguinal hernia, but given his ongoing passage of stool and lack of significant pain, obstruction was felt to be very unlikely.  Given the significant colonic dilation with minimal symptoms, a pseudoobstruction may be more likely.    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 No free air Clinical improvement in abdominal pain and abdominal distention,  although xray shows continued dilation.    Diarrhea - E. Coli on GI pathogen panel - possibly secondary to abx, daratumnunab, ensure(?)   PE - on lovenox    Plan:   - Serial KUBs - resort back to clear liquids since unable to tolerate full diet - continue to replenish electrolytes as needed to keep potassium above 4 and magnesium above 2. -Can continue trial of cholestyramine with close monitoring.  If no improvement in diarrhea recommend stopping cholestyramine  Bayley M McMichael  08/03/2022, 8:57 AM     Attending physician's note   I have taken history, reviewed the chart and examined the patient. I performed a substantive portion of this encounter, including complete performance of at least one of the key components, in conjunction with the APP. I agree with the Advanced Practitioner's note, impression and recommendations.   Diarrhea is still there but seems to be better.  He is off all antibiotics Abdominal distention clinically better Lets observe and see how he feels tomorrow Monitor and correct electrolytes.  D/W wife   Edman Circle, MD [facility] [phone]

## 2022-08-03 NOTE — Plan of Care (Signed)
  Problem: Education: Goal: Knowledge of General Education information will improve Description: Including pain rating scale, medication(s)/side effects and non-pharmacologic comfort measures Outcome: Progressing   Problem: Skin Integrity: Goal: Risk for impaired skin integrity will decrease Outcome: Progressing   

## 2022-08-03 NOTE — Progress Notes (Signed)
08/03/22 1100  SLP Visit Information  SLP Received On 08/03/22  General Information  Behavior/Cognition Alert;Cooperative;Pleasant mood;Other (Comment) (tearful)  Patient Positioning Upright in bed  Oral care provided N/A  HPI Patient is an 82 y.o. old male with PMH: epistaxis, hypertension, prediabetes, stage IIIa CKD, pathological fracture of the right femoral, multiple myeloma who presents to the ED due to wheezing and rhonchi. He reported abdominal distension and watery stool. ED work up included: CXR not showing active cardiopulmonary disease, CTA chest showing left LL segmental pulmonary PE with minimal clot burden, CT abdomen showed dilated colon with air-fluid level seen and scattered colonic diverticula, separate large known right inguinal hernia involving small bowel and significant portion of urinary bladder. Patient admitted with PE and SBO. Surgery consulted, who feels that patient has a pseudo-obstruction. RN reports patient unable to swallow whole pills but able to swallow water.  Treatment Provided   Treatment provided Dysphagia  Dysphagia Treatment  Patient seen to today due to RN concerns for overt aspiration when taking pills yesterday and after SLP evaluation session.  Today pt became tearful when asked about length of his dysphagia - stating since he started his treatment for his cancer.  Pt takes his medications with liquid or applesauce at home.   He admits to issues with refluxing and sensing food lodging in pharynx and esophagus - also reports nausea contributing to his poor intake. After just a few bites/sips, pt denied consuming more stating he was "full".    Observed pt consuming Sprite and applesauce with him self feeding easily- Minimal delayed swallow initiation noted most notably with applesauce.    Delayed cough and eructation noted after approximately 3 bites of applesauce- concerning for primary esophageal source of dysphagia causing potential retrograde  propulsion into pharynx.  Cough was subtle and did not appear uncomfortable.    Pt's diet now changed to clear liquids per GI - thus SLP posted swallow precaution signs with new diet plan.    Temperature Spikes Noted Yes (99.4)  Oral Cavity - Dentition Edentulous  Feeding Needed set up  Patient observed directly with PO's Yes  Liquids provided via Straw  Type of cueing Verbal;Tactile;Visual  Amount of cueing Minimal  Treatment Methods Skilled observation;Patient/caregiver education;Compensation strategy training  Oral Phase Signs & Symptoms Other (comment) (n/a)  Pharyngeal Phase Signs & Symptoms Delayed cough  Type of PO's observed Thin liquids;Dysphagia 1 (puree) (applesauce and soda)  Pain Assessment  Pain Assessment No/denies pain  SLP - End of Session  Patient left in bed;with bed alarm set;with call bell/phone within reach  Nurse Communication Aspiration precautions reviewed;Swallow strategies reviewed;Diet recommendation  Assessment / Recommendations / Plan  Plan Continue with current plan of care  Dysphagia Recommendations  Diet recommendations Thin liquid  Liquids provided via Straw  Medication Administration Whole meds with puree  Supervision Intermittent supervision to cue for compensatory strategies  Compensations Minimize environmental distractions;Slow rate;Small sips/bites  Postural Changes and/or Swallow Maneuvers Out of bed for meals;Seated upright 90 degrees  PMSV Recommendations  Patient may use Passy-Muir Speech Valve with SLP only (with RT and SLP for vent valve)  PMSV Supervision Full  General Recommendations  Oral Care Recommendations Oral care BID  Follow Up Recommendations  (TBD)  Assistance recommended at discharge Other (comment)  SLP Visit Diagnosis Dysphagia, unspecified (R13.10)  Progression Toward Goals  Progression toward goals Progressing toward goals  SLP Time Calculation  SLP Start Time (ACUTE ONLY) 0935  SLP Stop Time (ACUTE ONLY) 1610  SLP Time Calculation (min) (ACUTE ONLY) 16 min  SLP Evaluations  $ SLP Speech Visit 1 Visit  SLP Evaluations  $Swallowing Treatment 1 Procedure   Rolena Infante, MS [facility] SLP Acute Rehab Services Office [phone]

## 2022-08-03 NOTE — Progress Notes (Signed)
OT Cancellation Note  Patient Details Name: Frank Hernandez MRN: [id number] DOB: 1940/05/19   Cancelled Treatment:    Reason Eval/Treat Not Completed: Other (comment) Patient was in bed eating lunch with wife assistance. OT to continue to follow and check back as schedule will allow.  Rosalio Loud, MS Acute Rehabilitation Department Office# [phone]  08/03/2022, 1:48 PM

## 2022-08-03 NOTE — Progress Notes (Signed)
PROGRESS NOTE    Frank Hernandez  ZOX:[id number] DOB: October 04, 1940 DOA: 07/29/2022 PCP: [name] (Inactive)   Brief Narrative: 82 year old with past medical history significant for epistaxis, hypertension, prediabetes, stage IIIa CKD, pathological fracture of the right femoral, multiple myeloma who presents to the ED due to wheezing and rhonchi.  EMS gave albuterol and routes.  Patient has been taking antibiotics for cellulitis.  He has had worsening of his right foot wound.  He reports abdominal distention, he report small watery stool.  Evaluation in the ED chest x-ray no active cardiopulmonary disease.  CTA chest showed left lower lobe segmental pulmonary PE with overall minimal clot burden.  CT abdomen with dilated colon with air-fluid level seen and scattered colonic diverticula.  Transition seen in the area of the new left inguinal hernia involving the sigmoid colon.  No pneumatosis.  Small bowel and stomach are known dilated but there is an enteric tube.  Separate large known right inguinal hernia involving the small bowel and significant portion of the urinary bladder.  Lytic bone lesion consistent with known history of myeloma.  Patient admitted with PE and a small bowel obstruction.  Surgery and GI consulted. 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 Diarrhea thought to be related to daratumnunab.    Assessment & Plan:   Principal Problem:   Bowel obstruction (HCC) Active Problems:   Essential hypertension   Pre-diabetes   Hypokalemia   Chronic kidney disease, stage 3a (HCC)   Normocytic anemia   Multiple myeloma not having achieved remission (HCC)   Hypomagnesemia   Prolonged QT interval   Cellulitis of both lower extremities   Acute pulmonary embolism (HCC)   1-Acute Pulmonary Embolism, Acute DVT left femoral vein -CTA Chest: Left lower lobe segmental pulmonary embolism. Overall minimal clot burden. -Doppler LE: indings  consistent with acute deep vein thrombosis involving the left common femoral vein, and left femoral vein. Findings consistent with age indeterminate deep vein thrombosis  involving the left popliteal vein.  Continue with  Lovenox.  ECHO: RV function Normal.   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 Transition seen in the area of the new left inguinal hernia involving the sigmoid colon. Please correlate for reduce ability. No pneumatosis, free air or fluid. Small bowel and stomach are nondilated but there is an enteric tube. -NG tube  has been removed by General Surgery.  -He is having Multiples watery BM. -Diest advanced to regular.  -KUB: Persistent gaseous distension of colon.  -Back on Clear diet, not eating much.   3-Diarrhea; C diff negative. Having issues with Diarrhea since last hospitalization.  GI pathogen  positive for enteropathogenic E coli.  Completed Azithro for 3 days.  Thought to be related to Chemo: Daratumumab.  Avoid Imodium due to Pseudo-obstruction Syndrome Follow up GI recommendation for management.  Started on Cholestyramine.   Colonic Distension:  GI consulted, think patient could have Ogilvie's/Pseudo-obstruction.  Avoid Imodium.  Unable to do Neostigmine, because it can worsen diarrhea.  KUB Persistent distension.    4-Metabolic Acidosis: in setting of Diarrhea, dehydration.  Holding Bicarb Gtt due to hypokalemia.  Continue with Bicarb oral supplement.   5-Cellulitis of both lower extremity: Completed 5 days Ancef.   6-Hypomagnesemia: IV magnesium   7-Hypokalemia: Continue with IV Kcl supplement.  Repeat Bmet this afternoon.   Hypophosphatemia: Replete IV    Prolonged QT 2: Replete electrolytes. QT 450  Respiratory Distress; Wheezing.  Check x ray negative for pulmonary edema. He has profuse diarrhea. Will  continue with low rate IV fluids.  Continue with schedule nebulizer.   Hypertension: PRN Metoprolol.   Pre-Diabetes: On diet controlled.   AKI on CKD stage IIIa: Previous Cr 1.4--1.3 Presents with Cr 1.8.  Related to hypovolemia. Pre renal.  Improving with fluids.   Normocytic  anemia: anemia of malignancy MM Monitor hb.   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 PRN tylenol  Multiple myeloma Needs to follow up with oncology out patient.  See wound care documentation below.  Pressure Injury 03/29/22 Buttocks Right Stage 1 -  Intact skin with non-blanchable redness of a localized area usually over a bony prominence. (Active)  03/29/22 2140  Location: Buttocks  Location Orientation: Right  Staging: Stage 1 -  Intact skin with non-blanchable redness of a localized area usually over a bony prominence.  Wound Description (Comments):   Present on Admission: Yes     Pressure Injury 05/28/22 Heel Right Stage 3 -  Full thickness tissue loss. Subcutaneous fat may be visible but bone, tendon or muscle are NOT exposed. Open wound, broken from pressure & dry skin (Active)  05/28/22 1400  Location: Heel  Location Orientation: Right  Staging: Stage 3 -  Full thickness tissue loss. Subcutaneous fat may be visible but bone, tendon or muscle are NOT exposed.  Wound Description (Comments): Open wound, broken from pressure & dry skin  Present on Admission: Yes  Dressing Type Foam - Lift dressing to assess site every shift 08/03/22 0827    Estimated body mass index is 29.39 kg/m as calculated from the following:   Height as of this encounter: 5\' 9"  (1.753 m).   Weight as of this encounter: 90.3 kg.   DVT prophylaxis: Lovenox Code Status: Full code Family Communication: Wife who was at bedside 6/03 Disposition Plan:  Status is: Inpatient Remains inpatient appropriate because: management of medical problems, AKI, metabolic  acidosis. PE    Consultants:  Surgery   Procedures:  ECHO  Antimicrobials:    Subjective: Denies abdominal pain.  Not eating much.   Objective: Vitals:   08/02/22 1942 08/02/22 2300 08/03/22 0800 08/03/22 1200  BP:      Pulse:      Resp:      Temp: 98.3 F (36.8 C) 99.4 F (37.4 C) 98.6 F (37 C) 98.1 F (36.7 C)  TempSrc: Oral Axillary Oral Oral  SpO2:      Weight:      Height:        Intake/Output Summary (Last 24 hours) at 08/03/2022 1407 Last data filed at 08/03/2022 0827 Gross per 24 hour  Intake 598.03 ml  Output 3000 ml  Net -2401.97 ml    Filed Weights   07/29/22 0822  Weight: 90.3 kg    Examination:  General exam: NAD Respiratory system: CTA Cardiovascular system: S 1, S 2 RRR Gastrointestinal system: BS present, soft, nt Central nervous system: Alert.  Extremities: Right heel with open wound, coin size.    Data Reviewed: I have personally reviewed following labs and imaging studies  CBC: Recent Labs  Lab 07/29/22 0835 07/30/22 0316 07/31/22 0301 08/01/22 0304 08/02/22 0302 08/03/22 0839  WBC 6.0 6.3 5.0 3.9* 3.9* 4.3  NEUTROABS 2.8  --   --   --   --   --   HGB 9.9* 9.0* 8.2* 8.0* 8.1* 8.7*  HCT 29.8* 27.9* 24.0* 23.9*  24.3* 25.5*  MCV 96.1 99.3 94.5 93.4 95.3 92.4  PLT 252 230 212 213 217 194    Basic Metabolic Panel: Recent Labs  Lab 07/30/22 0316 07/30/22 1521 07/31/22 0301 07/31/22 1901 08/01/22 0304 08/01/22 1715 08/02/22 0302 08/02/22 1621 08/02/22 2031 08/03/22 0839  NA 141   < > 142   < > 143 142 141 140  --  142  K 3.5   < > 2.4*   < > 2.6* 3.3* 2.8* 3.5  --  3.1*  CL 122*   < > 120*   < > 122* 120* 121* 118*  --  121*  CO2 9*   < > 12*   < > 12* 11* 12* 13*  --  14*  GLUCOSE 99   < > 89   < > 90 116* 91 121*  --  95  BUN 13   < > 12   < > 9 7* 6* 6*  --  <5*  CREATININE 1.54*   < > 1.29*   < > 1.11 1.07 1.01 1.13  --  1.01  CALCIUM 6.7*   < > 6.7*   < > 6.8* 6.9* 6.6* 6.5*  --  6.3*  MG 2.0  --  1.5*  --   1.7  --  1.6*  --   --  1.6*  PHOS  --   --   --   --   --   --  <1.0*  --  1.8* 1.4*   < > = values in this interval not displayed.    GFR: Estimated Creatinine Clearance: 63.7 mL/min (by C-G formula based on SCr of 1.01 mg/dL). Liver Function Tests: Recent Labs  Lab 07/30/22 0316 07/31/22 0301 08/01/22 0304  AST 24 24 22   ALT 12 13 9   ALKPHOS 85 80 68  BILITOT 1.0 0.5 0.8  PROT 5.5* 4.8* 4.7*  ALBUMIN 3.0* 2.7* 2.5*    No results for input(s): "LIPASE", "AMYLASE" in the last 168 hours. No results for input(s): "AMMONIA" in the last 168 hours. Coagulation Profile: No results for input(s): "INR", "PROTIME" in the last 168 hours. Cardiac Enzymes: No results for input(s): "CKTOTAL", "CKMB", "CKMBINDEX", "TROPONINI" in the last 168 hours. BNP (last 3 results) No results for input(s): "PROBNP" in the last 8760 hours. HbA1C: No results for input(s): "HGBA1C" in the last 72 hours. CBG: Recent Labs  Lab 08/02/22 1745 08/02/22 2309 08/03/22 0530 08/03/22 0754 08/03/22 1153  GLUCAP 107* 89 89 86 96    Lipid Profile: No results for input(s): "CHOL", "HDL", "LDLCALC", "TRIG", "CHOLHDL", "LDLDIRECT" in the last 72 hours. Thyroid Function Tests: No results for input(s): "TSH", "T4TOTAL", "FREET4", "T3FREE", "THYROIDAB" in the last 72 hours. Anemia Panel: No results for input(s): "VITAMINB12", "FOLATE", "FERRITIN", "TIBC", "IRON", "RETICCTPCT" in the last 72 hours. Sepsis Labs: Recent Labs  Lab 07/29/22 0918 07/29/22 1150  LATICACIDVEN 2.1* 1.8     Recent Results (from the past 240 hour(s))  SARS Coronavirus 2 by RT PCR (hospital order, performed in [facility] hospital lab) *cepheid single result test* Anterior Nasal Swab     Status: None   Collection Time: 07/29/22  8:26 AM   Specimen: Anterior Nasal Swab  Result Value Ref Range Status   SARS Coronavirus 2 by RT PCR NEGATIVE NEGATIVE Final    Comment: (NOTE) SARS-CoV-2 target nucleic acids are NOT DETECTED.  The  SARS-CoV-2 RNA is generally detectable in upper and lower respiratory specimens during the acute phase of infection. The lowest  concentration of SARS-CoV-2 viral copies this assay can detect is 250 copies / mL. A negative result does not preclude SARS-CoV-2 infection and should not be used as the sole basis for treatment or other patient management decisions.  A negative result may occur with improper specimen collection / handling, submission of specimen other than nasopharyngeal swab, presence of viral mutation(s) within the areas targeted by this assay, and inadequate number of viral copies (<250 copies / mL). A negative result must be combined with clinical observations, patient history, and epidemiological information.  Fact Sheet for Patients:   RoadLapTop.co.za  Fact Sheet for Healthcare Providers: http://kim-miller.com/  This test is not yet approved or  cleared by the [place] FDA and has been authorized for detection and/or diagnosis of SARS-CoV-2 by FDA under an Emergency Use Authorization (EUA).  This EUA will remain in effect (meaning this test can be used) for the duration of the COVID-19 declaration under Section 564(b)(1) of the Act, 21 U.S.C. section 360bbb-3(b)(1), unless the authorization is terminated or revoked sooner.  Performed at [facility], [address], [place], [place] [address]   Blood culture (routine x 2)     Status: None (Preliminary result)   Collection Time: 07/29/22  9:19 AM   Specimen: BLOOD  Result Value Ref Range Status   Specimen Description   Final    BLOOD PERIPHERAL Performed at [facility], [address] [place], [place] [address]    Special Requests   Final    BOTTLES DRAWN AEROBIC AND ANAEROBIC Blood Culture adequate volume Performed at [facility], [address] [place], [place] [address]    Culture   Final    NO  GROWTH 4 DAYS Performed at [facility], [address], [place], [place] [address]    Report Status PENDING  Incomplete  Blood culture (routine x 2)     Status: None (Preliminary result)   Collection Time: 07/29/22  9:22 AM   Specimen: BLOOD  Result Value Ref Range Status   Specimen Description   Final    BLOOD RIGHT ANTECUBITAL Performed at [facility], [address] [place], [place] [address]    Special Requests   Final    BOTTLES DRAWN AEROBIC AND ANAEROBIC Blood Culture adequate volume Performed at [facility], [address] [place] [address] [place], [place] [address]    Culture   Final    NO GROWTH 4 DAYS Performed at [facility], [address] [place], [place] [address]    Report Status PENDING  Incomplete  MRSA Next Gen by PCR, Nasal     Status: None   Collection Time: 07/29/22  3:21 PM   Specimen: Nasal Mucosa; Nasal Swab  Result Value Ref Range Status   MRSA by PCR Next Gen NOT DETECTED NOT DETECTED Final    Comment: (NOTE) The GeneXpert MRSA Assay (FDA approved for NASAL specimens only), is one component of a comprehensive MRSA colonization surveillance program. It is not intended to diagnose MRSA infection nor to guide or monitor treatment for MRSA infections. Test performance is not FDA approved in patients less than 64 years old. Performed at [facility], [address] [place], [place] [address]   C Difficile Quick Screen w PCR reflex     Status: None   Collection Time: 07/30/22  1:46 PM   Specimen: STOOL  Result Value Ref Range Status   C Diff antigen NEGATIVE NEGATIVE Final  C Diff toxin NEGATIVE NEGATIVE Final   C Diff interpretation No C. difficile detected.  Final    Comment: Performed at [facility], [address] [place] [address] [place], [place] [address]  Gastrointestinal Panel by PCR , Stool     Status: Abnormal   Collection Time: 07/30/22  1:46 PM   Specimen: Stool  Result  Value Ref Range Status   Campylobacter species NOT DETECTED NOT DETECTED Final   Plesimonas shigelloides NOT DETECTED NOT DETECTED Final   Salmonella species NOT DETECTED NOT DETECTED Final   Yersinia enterocolitica NOT DETECTED NOT DETECTED Final   Vibrio species NOT DETECTED NOT DETECTED Final   Vibrio cholerae NOT DETECTED NOT DETECTED Final   Enteroaggregative E coli (EAEC) NOT DETECTED NOT DETECTED Final   Enteropathogenic E coli (EPEC) DETECTED (A) NOT DETECTED Final    Comment: RESULT CALLED TO, READ BACK BY AND VERIFIED WITH:  ISABELLE URQIJO 07/31/2022 1420 CP    Enterotoxigenic E coli (ETEC) NOT DETECTED NOT DETECTED Final   Shiga like toxin producing E coli (STEC) NOT DETECTED NOT DETECTED Final   Shigella/Enteroinvasive E coli (EIEC) NOT DETECTED NOT DETECTED Final   Cryptosporidium NOT DETECTED NOT DETECTED Final   Cyclospora cayetanensis NOT DETECTED NOT DETECTED Final   Entamoeba histolytica NOT DETECTED NOT DETECTED Final   Giardia lamblia NOT DETECTED NOT DETECTED Final   Adenovirus F40/41 NOT DETECTED NOT DETECTED Final   Astrovirus NOT DETECTED NOT DETECTED Final   Norovirus GI/GII NOT DETECTED NOT DETECTED Final   Rotavirus A NOT DETECTED NOT DETECTED Final   Sapovirus (I, II, IV, and V) NOT DETECTED NOT DETECTED Final    Comment: Performed at [facility], [address] [place], [place] [address]  OVA + PARASITE EXAM     Status: None   Collection Time: 07/31/22  8:55 AM   Specimen: Stool  Result Value Ref Range Status   OVA + PARASITE EXAM WRORD  Final    Comment: (NOTE) Test not performed. The required specimen for the test ordered was not received. Notified Rozelle Logan Test requires P/P Pink and Wallace Cullens; Received RT Stool These results were obtained using wet preparation(s) and trichrome stained smear. This test does not include testing for Cryptosporidium parvum, Cyclospora, or Microsporidia. Performed At: [facility] [address]  [place], [place] [id number] Jolene Schimke MD WG:[id number]    Source of Sample STOOL  Final    Comment: Performed at [facility], [address], [place], [place] [address]         Radiology Studies: DG Abd 1 View  Result Date: 08/02/2022 CLINICAL DATA:  Evaluate ileus EXAM: ABDOMEN - 1 VIEW COMPARISON:  07/30/2022 FINDINGS: Persistent gaseous dilatation of the colon is noted. Cecum is dilated to 10.6 cm. No free air is seen. Postsurgical changes in the proximal femurs are seen. IMPRESSION: Gaseous dilatation of the colon most consistent with a colonic ileus. Electronically Signed   By: Alcide Clever M.D.   On: 08/02/2022 19:56        Scheduled Meds:  acyclovir  400 mg Oral QHS   budesonide (PULMICORT) nebulizer solution  0.25 mg Nebulization BID   Chlorhexidine Gluconate Cloth  6 each Topical Daily   cholestyramine light  4 g Oral Daily   enoxaparin (LOVENOX) injection  1 mg/kg Subcutaneous Q12H   ipratropium-albuterol  3 mL Nebulization BID   leptospermum manuka honey  1 Application Topical Daily   multivitamin with minerals  1 tablet Oral Daily  pantoprazole (PROTONIX) IV  40 mg Intravenous Q24H   potassium chloride  40 mEq Oral BID   sodium bicarbonate  1,300 mg Oral TID   Continuous Infusions:  0.9 % NaCl with KCl 20 mEq / L 75 mL/hr at 08/03/22 0736   potassium chloride Stopped (08/03/22 1357)   potassium PHOSPHATE IVPB (in mmol) 45 mmol (08/03/22 1157)     LOS: 5 days    Time spent: 35 minutes.     Alba Cory, MD [facility]   If 7PM-7AM, please contact night-coverage [email or web address]  08/03/2022, 2:07 PM

## 2022-08-04 ENCOUNTER — Inpatient Hospital Stay (HOSPITAL_COMMUNITY): Payer: Medicare PPO

## 2022-08-04 DIAGNOSIS — R197 Diarrhea, unspecified: Secondary | ICD-10-CM | POA: Diagnosis not present

## 2022-08-04 DIAGNOSIS — I2693 Single subsegmental pulmonary embolism without acute cor pulmonale: Secondary | ICD-10-CM

## 2022-08-04 DIAGNOSIS — K5981 Ogilvie syndrome: Secondary | ICD-10-CM | POA: Diagnosis not present

## 2022-08-04 DIAGNOSIS — R0600 Dyspnea, unspecified: Secondary | ICD-10-CM

## 2022-08-04 DIAGNOSIS — C9000 Multiple myeloma not having achieved remission: Secondary | ICD-10-CM | POA: Diagnosis not present

## 2022-08-04 DIAGNOSIS — K56609 Unspecified intestinal obstruction, unspecified as to partial versus complete obstruction: Secondary | ICD-10-CM | POA: Diagnosis not present

## 2022-08-04 DIAGNOSIS — I2699 Other pulmonary embolism without acute cor pulmonale: Secondary | ICD-10-CM | POA: Diagnosis not present

## 2022-08-04 LAB — BASIC METABOLIC PANEL
Anion gap: 8 (ref 5–15)
BUN: <5 mg/dL — ABNORMAL LOW (ref 8–23)
CO2: 14 mmol/L — ABNORMAL LOW (ref 22–32)
Calcium: 6 mg/dL — CL (ref 8.9–10.3)
Chloride: 119 mmol/L — ABNORMAL HIGH (ref 98–111)
Creatinine, Ser: 0.87 mg/dL (ref 0.61–1.24)
GFR, Estimated: >60 mL/min (ref >60)
Glucose, Bld: 79 mg/dL (ref 70–99)
Potassium: 3.8 mmol/L (ref 3.5–5.1)
Sodium: 141 mmol/L (ref 135–145)

## 2022-08-04 LAB — GLUCOSE, CAPILLARY
Glucose-Capillary: 133 mg/dL — ABNORMAL HIGH (ref 70–99)
Glucose-Capillary: 76 mg/dL (ref 70–99)
Glucose-Capillary: 90 mg/dL (ref 70–99)
Glucose-Capillary: 112 mg/dL — ABNORMAL HIGH (ref 70–99)

## 2022-08-04 LAB — CBC
HCT: 24.4 % — ABNORMAL LOW (ref 39.0–52.0)
Hemoglobin: 8.1 g/dL — ABNORMAL LOW (ref 13.0–17.0)
MCH: 31.4 pg (ref 26.0–34.0)
MCHC: 33.2 g/dL (ref 30.0–36.0)
MCV: 94.6 fL (ref 80.0–100.0)
Platelets: 184 10*3/uL (ref 150–400)
RBC: 2.58 MIL/uL — ABNORMAL LOW (ref 4.22–5.81)
RDW: 18.1 % — ABNORMAL HIGH (ref 11.5–15.5)
WBC: 3.8 10*3/uL — ABNORMAL LOW (ref 4.0–10.5)
nRBC: 0 % (ref 0.0–0.2)

## 2022-08-04 LAB — PHOSPHORUS: Phosphorus: 2.6 mg/dL (ref 2.5–4.6)

## 2022-08-04 LAB — MAGNESIUM: Magnesium: 1.6 mg/dL — ABNORMAL LOW (ref 1.7–2.4)

## 2022-08-04 MED ORDER — POTASSIUM CHLORIDE CRYS ER 20 MEQ PO TBCR
40.0000 meq | EXTENDED_RELEASE_TABLET | Freq: Two times a day (BID) | ORAL | Status: DC
  Administered 2022-08-04 – 2022-08-06 (×6): 40 meq via ORAL
  Filled 2022-08-04: qty 2
  Filled 2022-08-04: qty 4
  Filled 2022-08-04 (×4): qty 2

## 2022-08-04 MED ORDER — BOOST / RESOURCE BREEZE PO LIQD CUSTOM
1.0000 | Freq: Three times a day (TID) | ORAL | Status: DC
  Administered 2022-08-04 – 2022-08-16 (×32): 1 via ORAL

## 2022-08-04 MED ORDER — CALCIUM GLUCONATE-NACL 1-0.675 GM/50ML-% IV SOLN
1.0000 g | Freq: Once | INTRAVENOUS | Status: AC
  Administered 2022-08-04: 1000 mg via INTRAVENOUS
  Filled 2022-08-04: qty 50

## 2022-08-04 MED ORDER — STERILE WATER FOR INJECTION IV SOLN
INTRAVENOUS | Status: DC
  Filled 2022-08-04 (×3): qty 150
  Filled 2022-08-04: qty 1000
  Filled 2022-08-04: qty 150

## 2022-08-04 MED ORDER — MAGNESIUM SULFATE 4 GM/100ML IV SOLN
4.0000 g | Freq: Once | INTRAVENOUS | Status: AC
  Administered 2022-08-04: 4 g via INTRAVENOUS
  Filled 2022-08-04: qty 100

## 2022-08-04 NOTE — Progress Notes (Addendum)
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 Only had 3 episodes yesterday. Tolerating clear liquids without difficulty and tried watermelon without difficulty. Ready to try to advance diet. Denies abdominal pain, nausea, and vomiting.  No family was present at the time of my evaluation.    Objective   Vital signs in last 24 hours: Temp:  [97.9 F (36.6 C)-98.7 F (37.1 C)] 98.3 F (36.8 C) (06/05 0726) Pulse Rate:  [64-77] 76 (06/05 0600) Resp:  [11-22] 19 (06/05 0600) BP: (108-148)/(47-88) 148/66 (06/05 0500) SpO2:  [98 %-100 %] 100 % (06/05 0600) Last BM Date : 08/03/22 Last BM recorded by nurses in past 5 days Stool Type: Type 7 (Liquid consistency with no solid pieces) (08/03/2022  8:27 AM)  General:   male in no acute distress, receiving NEB treatment Heart:  Regular rate and rhythm; no murmurs Pulm: Clear anteriorly; no wheezing Abdomen: soft, nondistended, normal bowel sounds in all quadrants. Nontender without guarding. No organomegaly appreciated. Clinically improved since initial physical exam Extremities:  No edema Neurologic:  Alert and  oriented x4;  No focal deficits.  Psych:  Cooperative. Normal mood and affect.  Intake/Output from previous day: 06/04 0701 - 06/05 0700 In: 774.2 [IV Piggyback:774.2] Out: 2700 [Urine:1300; Stool:1400] Intake/Output this shift: Total I/O In: 3588.8 [I.V.:3588.8] Out: -   Studies/Results: DG Abd 1 View  Result Date: 08/02/2022 CLINICAL DATA:  Evaluate ileus EXAM: ABDOMEN - 1 VIEW COMPARISON:  07/30/2022 FINDINGS: Persistent gaseous dilatation of the colon is noted. Cecum is dilated to 10.6 cm. No free air is seen. Postsurgical changes in the proximal femurs are seen. IMPRESSION: Gaseous dilatation of the colon most consistent with a colonic ileus. Electronically Signed   By: Alcide Clever M.D.   On:  08/02/2022 19:56    Lab Results: Recent Labs    08/02/22 0302 08/03/22 0839 08/04/22 0313  WBC 3.9* 4.3 3.8*  HGB 8.1* 8.7* 8.1*  HCT 24.3* 25.5* 24.4*  PLT 217 194 184   BMET Recent Labs    08/03/22 0839 08/03/22 1917 08/04/22 0313  NA 142 140 141  K 3.1* 3.4* 3.8  CL 121* 117* 119*  CO2 14* 15* 14*  GLUCOSE 95 94 79  BUN <5* <5* <5*  CREATININE 1.01 0.93 0.87  CALCIUM 6.3* 6.2* 6.0*   LFT No results for input(s): "PROT", "ALBUMIN", "AST", "ALT", "ALKPHOS", "BILITOT", "BILIDIR", "IBILI" in the last 72 hours. PT/INR No results for input(s): "LABPROT", "INR" in the last 72 hours.   Scheduled Meds:  acyclovir  400 mg Oral QHS   budesonide (PULMICORT) nebulizer solution  0.25 mg Nebulization BID   Chlorhexidine Gluconate Cloth  6 each Topical Daily   cholestyramine light  4 g Oral Daily   enoxaparin (LOVENOX) injection  1 mg/kg Subcutaneous Q12H   ipratropium-albuterol  3 mL Nebulization BID   leptospermum manuka honey  1 Application Topical Daily   multivitamin with minerals  1 tablet Oral Daily   pantoprazole (PROTONIX) IV  40 mg Intravenous Q12H   potassium chloride  40 mEq Oral BID   sodium bicarbonate  1,300 mg Oral TID   Continuous Infusions:  0.9 % NaCl with KCl 20 mEq / L 75 mL/hr at 08/04/22 1610      Patient profile:   82 year old male with multiple myeloma, currently receiving treatment, admitted with respiratory symptoms, found to have new pulmonary  embolism, also with abdominal distention for several days and several months of diarrhea with incontinence, found to have significantly dilated colon. Initially, colonic dilation thought to be secondary to possible obstruction, possibly involving his large inguinal hernia, but given his ongoing passage of stool and lack of significant pain, obstruction was felt to be very unlikely.  Given the significant colonic dilation with minimal symptoms, a pseudoobstruction may be more likely.    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 No free air Clinical improvement in abdominal pain and abdominal distention.    Diarrhea - E. Coli on GI pathogen panel - possibly secondary to abx, daratumnunab, ensure(?) Clinically improving on cholestyramine without significant abdominal distension   PE - on lovenox     Plan:   - Clinically improving. Can re-peat abdominal xray to evaluate dilation of colon - Can advance diet as tolerated - Continue cholestyramine with close monitoring - Electrolytes appear to be improving, continue to monitor and correct as needed.  Bayley Leanna Sato  08/04/2022, 8:34 AM     Attending physician's note   I have taken history, reviewed the chart and examined the patient. I performed a substantive portion of this encounter, including complete performance of at least one of the key components, in conjunction with the APP. I agree with the Advanced Practitioner's note, impression and recommendations.   Diarrhea has improved Abdominal distention is better X-ray KUB today looking better than before  Plan: -Continue supportive treatment -Continue current meds.  -Ambulate -Monitor and correct electrolytes. -Will sign off for now. -D/W wife   Edman Circle, MD [facility] [phone]

## 2022-08-04 NOTE — TOC Progression Note (Signed)
Transition of Care [facility]) - Progression Note   Patient Details  Name: Frank Hernandez MRN: [id number] Date of Birth: [birth year]  Transition of [facility] CM/SW Contact  Ewing Schlein, LCSW Phone Number: 08/04/2022, 2:47 PM  Clinical Narrative: CSW left voicemail for patient's spouse regarding SNF and requested call back.  Expected Discharge Plan: Home/Self Care Barriers to Discharge: Continued Medical Work up  Expected Discharge Plan and Services Discharge Planning Services: CM Consult Living arrangements for the past 2 months: Single Family Home  Social Determinants of Health (SDOH) Interventions SDOH Screenings   Food Insecurity: No Food Insecurity (07/30/2022)  Recent Concern: Food Insecurity - Food Insecurity Present (05/28/2022)  Housing: Low Risk  (07/30/2022)  Transportation Needs: No Transportation Needs (07/30/2022)  Recent Concern: Transportation Needs - Unmet Transportation Needs (05/28/2022)  Utilities: Not At Risk (07/30/2022)  Depression (PHQ2-9): Low Risk  (04/29/2022)  Tobacco Use: Low Risk  (07/29/2022)   Readmission Risk Interventions    08/03/2022    8:39 AM 04/14/2022    1:48 PM 04/02/2022   11:43 AM  Readmission Risk Prevention Plan  Post Dischage Appt   Complete  Medication Screening   Complete  Transportation Screening Complete Complete Complete  PCP or Specialist Appt within 5-7 Days  Complete   Home Care Screening  Complete   Medication Review (RN CM)  Complete   Medication Review (RN Care Manager) Complete    HRI or Home Care Consult Complete    SW Recovery Care/Counseling Consult Complete    Palliative Care Screening Not Applicable    Skilled Nursing Facility Complete

## 2022-08-04 NOTE — Progress Notes (Signed)
[facility]                                                                              Spartacus Setzer, is a 82 y.o. male, DOB - April 25, 1940, WUJ:[id number] Admit date - 07/29/2022    Outpatient Primary MD for the patient is [name] (Inactive)  LOS - 6  days  No chief complaint on file.      Brief summary   Patient is a 82 year old with hypertension, prediabetes, stage IIIa CKD, pathological fracture of the right femoral, multiple myeloma presented to ED with wheezing and rhonchi. Patient has been taking antibiotics for cellulitis.  He has had worsening of his right foot wound.  He reported abdominal distention with watery stool.   Chest x-ray showed no active cardiopulmonary disease.  CTA chest showed left lower lobe segmental PE with minimal clot burden.  CT abdomen with dilated colon with air-fluid level seen and scattered colonic diverticula.  Transition seen in the area of the new left inguinal hernia involving the sigmoid colon.  No pneumatosis.  Small bowel and stomach are known dilated but there is an enteric tube.  Separate large known right inguinal hernia involving the small bowel and significant portion of the urinary bladder.  Lytic bone lesion consistent with known history of myeloma.   Patient was admitted with PE and a small bowel obstruction.  Surgery and GI consulted. 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 Diarrhea thought to be related to daratumnunab.    Assessment & Plan    Principal Problem: Acute left lower lobe segmental pulmonary embolism,  acute DVT left femoral vein -CTA chest showed left lower lobe segmental PE, minimal clot burden -Venous Doppler consistent with acute DVT involving left common femoral vein, indeterminate age DVT. -Continue Lovenox -2D echo showed normal RV function  SBO; Pseudo-obstruction Ogilvie Syndrome: -Presents with obstructive symptoms, CT abdomen pelvis  showed Dilated colon with air-fluid level seen and scattered colonic diverticula. Transition seen in the area of the new left inguinal hernia involving the sigmoid colon. No pneumatosis, free air or fluid. Small bowel and stomach are nondilated -NG tube  has been removed by General Surgery.  -Having multiple watery BMs although states it is now improving -GI following.  Denies any nausea vomiting or abdominal pain.   Diarrhea; - C diff negative. Having issues with Diarrhea since last hospitalization.  -GI pathogen  positive for enteropathogenic E coli.  -Completed Azithro for 3 days.  -Thought to be related to Chemo: Daratumumab.  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  -Avoid Imodium.  -Unable to do Neostigmine, because it can worsen diarrhea.  -Abdominal distention, pain improving, follow abdominal x-ray today.  -Will advance diet to full liquids     NAG Metabolic Acidosis: in setting of GI losses -Currently on bicarb oral supplementation, CO2 still trending down -Will place on bicarb drip today  Hypomagnesemia, hypocalcemia -Replaced IV  Cellulitis of both lower extremity: Completed 5 days Ancef.  Hypokalemia -Resolved   Hypophosphatemia -Improved   Prolonged QT   Replete electrolytes. QT 450     Hypertension:  -BP currently stable   Pre-Diabetes: On diet controlled.    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 -Continue as needed Tylenol   Multiple myeloma Needs to follow up with  oncology out patient.    Pressure Injury Documentation: Pressure Injury 03/29/22 Buttocks Right Stage 1 -  Intact skin with non-blanchable redness of a localized area usually over a bony prominence. (Active)  03/29/22 2140  Location: Buttocks  Location Orientation: Right  Staging: Stage 1 -  Intact skin with non-blanchable redness of a localized area usually over a bony prominence.  Wound Description (Comments):   Present on Admission: Yes     Pressure Injury 05/28/22 Heel Right Stage 3 -  Full thickness tissue loss. Subcutaneous fat may be visible but bone, tendon or muscle are NOT exposed. Open wound, broken from pressure & dry skin (Active)  05/28/22 1400  Location: Heel  Location Orientation: Right  Staging: Stage 3 -  Full thickness tissue loss. Subcutaneous fat may be visible but bone, tendon or muscle are NOT exposed.  Wound Description (Comments): Open wound, broken from pressure & dry skin  Present on Admission: Yes  Dressing Type Foam - Lift dressing to assess site every shift 08/03/22 2000   Mild to moderate protein calorie malnutrition Nutrition Problem: Inadequate oral intake Etiology: acute illness (small bowel obstruction) Signs/Symptoms: energy intake < 75% for > 7 days Interventions: Refer to RD note for recommendations, Boost Breeze, MVI Estimated body mass index is 29.39 kg/m as calculated from the following:   Height as of this encounter: 5\' 9"  (1.753 m).   Weight as of this encounter: 90.3 kg.  Code Status: Full code DVT Prophylaxis:  Lovenox   Level of Care: Level of care: Stepdown Family Communication: Updated patient Disposition Plan:      Remains inpatient appropriate:      Procedures:  2D echo  Consultants:   General surgery GI  Antimicrobials:   Anti-infectives (From admission, onward)    Start     Dose/Rate Route Frequency Ordered Stop   08/01/22 1400  ceFAZolin (ANCEF) IVPB 2g/100 mL premix        2 g 200 mL/hr over 30 Minutes  Intravenous Every 8 hours 08/01/22 0824 08/03/22 0700   07/31/22 1600  azithromycin (ZITHROMAX) tablet 500 mg        500 mg Oral Daily 07/31/22 1446 08/02/22 1013   07/31/22 1000  vancomycin (VANCOREADY) IVPB 1500 mg/300 mL  Status:  Discontinued        1,500 mg 150 mL/hr over 120 Minutes Intravenous Every 48 hours 07/29/22 1243 07/30/22 0755   07/30/22 2200  acyclovir (ZOVIRAX) tablet 400 mg        400 mg Oral Daily at bedtime 07/30/22 1843     07/30/22 1400  ceFAZolin (ANCEF) IVPB 2g/100 mL premix  Status:  Discontinued        2 g 200 mL/hr over 30 Minutes Intravenous Every 8 hours 07/30/22 0843 08/01/22 0824   07/30/22 0900  doxycycline (VIBRAMYCIN) 100 mg in sodium chloride 0.9 % 250 mL IVPB  Status:  Discontinued        100  mg 125 mL/hr over 120 Minutes Intravenous Every 12 hours 07/30/22 0809 07/31/22 1445   07/29/22 1800  ceFEPIme (MAXIPIME) 2 g in sodium chloride 0.9 % 100 mL IVPB  Status:  Discontinued        2 g 200 mL/hr over 30 Minutes Intravenous Every 12 hours 07/29/22 1234 07/30/22 0841   07/29/22 1230  metroNIDAZOLE (FLAGYL) IVPB 500 mg  Status:  Discontinued        500 mg 100 mL/hr over 60 Minutes Intravenous Every 12 hours 07/29/22 1205 07/30/22 0909   07/29/22 0930  piperacillin-tazobactam (ZOSYN) IVPB 3.375 g        3.375 g 100 mL/hr over 30 Minutes Intravenous  Once 07/29/22 0920 07/29/22 1038   07/29/22 0930  vancomycin (VANCOREADY) IVPB 2000 mg/400 mL        2,000 mg 200 mL/hr over 120 Minutes Intravenous  Once 07/29/22 1610 07/29/22 1246          Medications  acyclovir  400 mg Oral QHS   budesonide (PULMICORT) nebulizer solution  0.25 mg Nebulization BID   Chlorhexidine Gluconate Cloth  6 each Topical Daily   cholestyramine light  4 g Oral Daily   enoxaparin (LOVENOX) injection  1 mg/kg Subcutaneous Q12H   feeding supplement  1 Container Oral TID BM   ipratropium-albuterol  3 mL Nebulization BID   leptospermum manuka honey  1 Application Topical Daily    multivitamin with minerals  1 tablet Oral Daily   pantoprazole (PROTONIX) IV  40 mg Intravenous Q12H   potassium chloride  40 mEq Oral BID   sodium bicarbonate  1,300 mg Oral TID      Subjective:   Carry Mcrea was seen and examined today.  Diarrhea improving, no nausea vomiting or abdominal pain.  No acute events overnight  Objective:   Vitals:   08/04/22 0500 08/04/22 0600 08/04/22 0726 08/04/22 1105  BP: (!) 148/66     Pulse: 66 76    Resp: (!) 22 19    Temp:   98.3 F (36.8 C) 97.9 F (36.6 C)  TempSrc:      SpO2: 99% 100%    Weight:      Height:        Intake/Output Summary (Last 24 hours) at 08/04/2022 1125 Last data filed at 08/04/2022 0702 Gross per 24 hour  Intake 4262.95 ml  Output 1800 ml  Net 2462.95 ml     Wt Readings from Last 3 Encounters:  07/29/22 90.3 kg  07/16/22 90.5 kg  07/09/22 91.9 kg     Exam General: Alert and oriented x 3, NAD Cardiovascular: S1 S2 auscultated,  RRR Respiratory: Clear to auscultation bilaterally, no wheezing, rales  Gastrointestinal: Soft, nontender, nondistended, + bowel sounds Ext: no pedal edema bilaterally Neuro: no new FND's Psych: Normal affect     Data Reviewed:  I have personally reviewed following labs    CBC Lab Results  Component Value Date   WBC 3.8 (L) 08/04/2022   RBC 2.58 (L) 08/04/2022   HGB 8.1 (L) 08/04/2022   HCT 24.4 (L) 08/04/2022   MCV 94.6 08/04/2022   MCH 31.4 08/04/2022   PLT 184 08/04/2022   MCHC 33.2 08/04/2022   RDW 18.1 (H) 08/04/2022   LYMPHSABS 2.0 07/29/2022   MONOABS 1.0 07/29/2022   EOSABS 0.2 07/29/2022   BASOSABS 0.0 07/29/2022     Last metabolic panel Lab Results  Component Value Date   NA 141 08/04/2022   K 3.8 08/04/2022   CL  119 (H) 08/04/2022   CO2 14 (L) 08/04/2022   BUN <5 (L) 08/04/2022   CREATININE 0.87 08/04/2022   GLUCOSE 79 08/04/2022   GFRNONAA >60 08/04/2022   GFRAA >60 12/27/2015   CALCIUM 6.0 (LL) 08/04/2022   PHOS 2.6 08/04/2022    PROT 4.7 (L) 08/01/2022   ALBUMIN 2.5 (L) 08/01/2022   LABGLOB 2.3 07/16/2022   AGRATIO 0.9 04/14/2022   BILITOT 0.8 08/01/2022   ALKPHOS 68 08/01/2022   AST 22 08/01/2022   ALT 9 08/01/2022   ANIONGAP 8 08/04/2022    CBG (last 3)  Recent Labs    08/04/22 0019 08/04/22 0610 08/04/22 1107  GLUCAP 133* 76 90      Coagulation Profile: No results for input(s): "INR", "PROTIME" in the last 168 hours.   Radiology Studies: I have personally reviewed the imaging studies  DG Abd 1 View  Result Date: 08/02/2022 CLINICAL DATA:  Evaluate ileus EXAM: ABDOMEN - 1 VIEW COMPARISON:  07/30/2022 FINDINGS: Persistent gaseous dilatation of the colon is noted. Cecum is dilated to 10.6 cm. No free air is seen. Postsurgical changes in the proximal femurs are seen. IMPRESSION: Gaseous dilatation of the colon most consistent with a colonic ileus. Electronically Signed   By: Alcide Clever M.D.   On: 08/02/2022 19:56       Tzion Wedel M.D. [facility] 08/04/2022, 11:25 AM  Available via Epic secure chat 7am-7pm After 7 pm, please refer to night coverage provider listed on amion.

## 2022-08-05 DIAGNOSIS — I2693 Single subsegmental pulmonary embolism without acute cor pulmonale: Secondary | ICD-10-CM | POA: Diagnosis not present

## 2022-08-05 DIAGNOSIS — R0600 Dyspnea, unspecified: Secondary | ICD-10-CM | POA: Diagnosis not present

## 2022-08-05 DIAGNOSIS — K56609 Unspecified intestinal obstruction, unspecified as to partial versus complete obstruction: Secondary | ICD-10-CM | POA: Diagnosis not present

## 2022-08-05 LAB — BASIC METABOLIC PANEL
Anion gap: 5 (ref 5–15)
Anion gap: 8 (ref 5–15)
BUN: <5 mg/dL — ABNORMAL LOW (ref 8–23)
BUN: <5 mg/dL — ABNORMAL LOW (ref 8–23)
CO2: 16 mmol/L — ABNORMAL LOW (ref 22–32)
CO2: 18 mmol/L — ABNORMAL LOW (ref 22–32)
Calcium: 5.7 mg/dL — CL (ref 8.9–10.3)
Calcium: 6.1 mg/dL — CL (ref 8.9–10.3)
Chloride: 115 mmol/L — ABNORMAL HIGH (ref 98–111)
Chloride: 112 mmol/L — ABNORMAL HIGH (ref 98–111)
Creatinine, Ser: 0.89 mg/dL (ref 0.61–1.24)
Creatinine, Ser: 0.91 mg/dL (ref 0.61–1.24)
GFR, Estimated: >60 mL/min (ref >60)
GFR, Estimated: >60 mL/min (ref >60)
Glucose, Bld: 93 mg/dL (ref 70–99)
Glucose, Bld: 110 mg/dL — ABNORMAL HIGH (ref 70–99)
Potassium: 2.6 mmol/L — CL (ref 3.5–5.1)
Potassium: 3.1 mmol/L — ABNORMAL LOW (ref 3.5–5.1)
Sodium: 136 mmol/L (ref 135–145)
Sodium: 138 mmol/L (ref 135–145)

## 2022-08-05 LAB — GLUCOSE, CAPILLARY
Glucose-Capillary: 105 mg/dL — ABNORMAL HIGH (ref 70–99)
Glucose-Capillary: 110 mg/dL — ABNORMAL HIGH (ref 70–99)
Glucose-Capillary: 115 mg/dL — ABNORMAL HIGH (ref 70–99)
Glucose-Capillary: 95 mg/dL (ref 70–99)
Glucose-Capillary: 98 mg/dL (ref 70–99)

## 2022-08-05 LAB — POTASSIUM: Potassium: 2.9 mmol/L — ABNORMAL LOW (ref 3.5–5.1)

## 2022-08-05 LAB — PHOSPHORUS: Phosphorus: 2 mg/dL — ABNORMAL LOW (ref 2.5–4.6)

## 2022-08-05 LAB — MAGNESIUM: Magnesium: 1.8 mg/dL (ref 1.7–2.4)

## 2022-08-05 MED ORDER — CHOLESTYRAMINE LIGHT 4 G PO PACK
4.0000 g | PACK | Freq: Two times a day (BID) | ORAL | Status: DC
  Administered 2022-08-05 – 2022-08-06 (×3): 4 g via ORAL
  Filled 2022-08-05 (×3): qty 1

## 2022-08-05 MED ORDER — MAGNESIUM SULFATE 2 GM/50ML IV SOLN
2.0000 g | Freq: Once | INTRAVENOUS | Status: AC
  Administered 2022-08-05: 2 g via INTRAVENOUS
  Filled 2022-08-05: qty 50

## 2022-08-05 MED ORDER — CALCIUM GLUCONATE-NACL 1-0.675 GM/50ML-% IV SOLN
1.0000 g | Freq: Once | INTRAVENOUS | Status: AC
  Administered 2022-08-05: 1000 mg via INTRAVENOUS
  Filled 2022-08-05: qty 50

## 2022-08-05 MED ORDER — POTASSIUM CHLORIDE 10 MEQ/100ML IV SOLN
10.0000 meq | INTRAVENOUS | Status: AC
  Administered 2022-08-05 (×4): 10 meq via INTRAVENOUS
  Filled 2022-08-05 (×3): qty 100

## 2022-08-05 MED ORDER — POTASSIUM CHLORIDE CRYS ER 20 MEQ PO TBCR
40.0000 meq | EXTENDED_RELEASE_TABLET | Freq: Once | ORAL | Status: AC
  Administered 2022-08-05: 40 meq via ORAL
  Filled 2022-08-05: qty 2

## 2022-08-05 MED ORDER — POTASSIUM PHOSPHATES 15 MMOLE/5ML IV SOLN
30.0000 mmol | Freq: Once | INTRAVENOUS | Status: AC
  Administered 2022-08-05: 30 mmol via INTRAVENOUS
  Filled 2022-08-05: qty 10

## 2022-08-05 NOTE — Progress Notes (Signed)
[facility]                                                                              Hermon Fierstein, is a 82 y.o. male, DOB - June 25, 1940, WUJ:[id number] Admit date - 07/29/2022    Outpatient Primary MD for the patient is [name] (Inactive)  LOS - 7  days  No chief complaint on file.      Brief summary   Patient is a 82 year old with hypertension, prediabetes, stage IIIa CKD, pathological fracture of the right femoral, multiple myeloma presented to ED with wheezing and rhonchi. Patient has been taking antibiotics for cellulitis.  He has had worsening of his right foot wound.  He reported abdominal distention with watery stool.   Chest x-ray showed no active cardiopulmonary disease.  CTA chest showed left lower lobe segmental PE with minimal clot burden.  CT abdomen with dilated colon with air-fluid level seen and scattered colonic diverticula.  Transition seen in the area of the new left inguinal hernia involving the sigmoid colon.  No pneumatosis.  Small bowel and stomach are known dilated but there is an enteric tube.  Separate large known right inguinal hernia involving the small bowel and significant portion of the urinary bladder.  Lytic bone lesion consistent with known history of myeloma.   Patient was admitted with PE and a small bowel obstruction.  Surgery and GI consulted. 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 Diarrhea thought to be related to daratumnunab.    Assessment & Plan    Principal Problem: Acute left lower lobe segmental pulmonary embolism,  acute DVT left femoral vein -CTA chest showed left lower lobe segmental PE, minimal clot burden -Venous Doppler consistent with acute DVT involving left common femoral vein, indeterminate age DVT. -Continue Lovenox -2D echo showed normal RV function  SBO; Pseudo-obstruction Ogilvie Syndrome, colonic distention: -Presents with obstructive symptoms, CT  abdomen pelvis showed Dilated colon with air-fluid level seen and scattered colonic diverticula. Transition seen in the area of the new left inguinal hernia involving the sigmoid colon. No pneumatosis, free air or fluid. Small bowel and stomach are nondilated -NG tube  has been removed by General Surgery.  -Per GI: Ogilvie's/Pseudo-obstruction.  -Avoid Imodium. Unable to do Neostigmine, because it can worsen diarrhea.  -GI signed off -Diet advanced to soft diet   Diarrhea; - C diff negative. Having issues with Diarrhea since last hospitalization.  -GI pathogen  positive for enteropathogenic E coli.  -Completed Azithro for 3 days.  -Thought to be related to Chemo: Daratumumab.  -Avoid Imodium due to Pseudo-obstruction Syndrome -IV fluids changed to bicarb drip due to worsening metabolic acidosis due to GI losses -Still having significant diarrhea, increased cholestyramine to twice daily      NAG Metabolic Acidosis: in setting of GI losses -Continue bicarb drip  Hypomagnesemia, hypocalcemia, hypokalemia -Replaced IV -Recheck potassium today  Hypophosphatemia -K-Phos x1   Cellulitis of both lower extremity: Completed 5 days Ancef.    Prolonged QT   Replete electrolytes. QT 450    Hypertension:  -BP currently stable   Pre-Diabetes: On diet controlled.    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 -Continue as needed Tylenol   Multiple myeloma Needs to follow up with oncology out patient.    Pressure Injury Documentation: Pressure Injury 03/29/22 Buttocks Right Stage 1 -  Intact skin with non-blanchable redness of a localized area usually over a bony prominence. (Active)  03/29/22 2140  Location:  Buttocks  Location Orientation: Right  Staging: Stage 1 -  Intact skin with non-blanchable redness of a localized area usually over a bony prominence.  Wound Description (Comments):   Present on Admission: Yes     Pressure Injury 05/28/22 Heel Right Stage 3 -  Full thickness tissue loss. Subcutaneous fat may be visible but bone, tendon or muscle are NOT exposed. Open wound, broken from pressure & dry skin (Active)  05/28/22 1400  Location: Heel  Location Orientation: Right  Staging: Stage 3 -  Full thickness tissue loss. Subcutaneous fat may be visible but bone, tendon or muscle are NOT exposed.  Wound Description (Comments): Open wound, broken from pressure & dry skin  Present on Admission: Yes  Dressing Type Foam - Lift dressing to assess site every shift 08/04/22 2000   Mild to moderate protein calorie malnutrition Nutrition Problem: Inadequate oral intake Etiology: acute illness (small bowel obstruction) Signs/Symptoms: energy intake < 75% for > 7 days Interventions: Refer to RD note for recommendations, Boost Breeze, MVI Estimated body mass index is 29.39 kg/m as calculated from the following:   Height as of this encounter: 5\' 9"  (1.753 m).   Weight as of this encounter: 90.3 kg.  Code Status: Full code DVT Prophylaxis:  Lovenox   Level of Care: Level of care: Stepdown Family Communication: Updated patient Disposition Plan:      Remains inpatient appropriate:      Procedures:  2D echo  Consultants:   General surgery GI  Antimicrobials:   Anti-infectives (From admission, onward)    Start     Dose/Rate Route Frequency Ordered Stop   08/01/22 1400  ceFAZolin (ANCEF) IVPB 2g/100 mL premix        2 g 200 mL/hr over 30 Minutes Intravenous Every 8 hours 08/01/22 0824 08/03/22 0700   07/31/22 1600  azithromycin (ZITHROMAX) tablet 500 mg        500 mg Oral Daily 07/31/22 1446 08/02/22 1013   07/31/22 1000  vancomycin (VANCOREADY) IVPB 1500 mg/300 mL  Status:   Discontinued        1,500 mg 150 mL/hr over 120 Minutes Intravenous Every 48 hours 07/29/22 1243 07/30/22 0755   07/30/22 2200  acyclovir (ZOVIRAX) tablet 400 mg        400 mg Oral Daily at bedtime 07/30/22 1843     07/30/22 1400  ceFAZolin (ANCEF) IVPB 2g/100 mL premix  Status:  Discontinued        2 g 200 mL/hr over 30 Minutes Intravenous Every 8 hours 07/30/22 0843 08/01/22 0824   07/30/22 0900  doxycycline (VIBRAMYCIN) 100 mg in sodium chloride 0.9 % 250 mL IVPB  Status:  Discontinued        100 mg 125 mL/hr over 120 Minutes Intravenous Every 12 hours 07/30/22 0809 07/31/22 1445   07/29/22 1800  ceFEPIme (MAXIPIME) 2 g in sodium chloride 0.9 % 100 mL IVPB  Status:  Discontinued  2 g 200 mL/hr over 30 Minutes Intravenous Every 12 hours 07/29/22 1234 07/30/22 0841   07/29/22 1230  metroNIDAZOLE (FLAGYL) IVPB 500 mg  Status:  Discontinued        500 mg 100 mL/hr over 60 Minutes Intravenous Every 12 hours 07/29/22 1205 07/30/22 0909   07/29/22 0930  piperacillin-tazobactam (ZOSYN) IVPB 3.375 g        3.375 g 100 mL/hr over 30 Minutes Intravenous  Once 07/29/22 0920 07/29/22 1038   07/29/22 0930  vancomycin (VANCOREADY) IVPB 2000 mg/400 mL        2,000 mg 200 mL/hr over 120 Minutes Intravenous  Once 07/29/22 0981 07/29/22 1246          Medications  acyclovir  400 mg Oral QHS   budesonide (PULMICORT) nebulizer solution  0.25 mg Nebulization BID   Chlorhexidine Gluconate Cloth  6 each Topical Daily   cholestyramine light  4 g Oral Q12H   enoxaparin (LOVENOX) injection  1 mg/kg Subcutaneous Q12H   feeding supplement  1 Container Oral TID BM   ipratropium-albuterol  3 mL Nebulization BID   leptospermum manuka honey  1 Application Topical Daily   multivitamin with minerals  1 tablet Oral Daily   pantoprazole (PROTONIX) IV  40 mg Intravenous Q12H   potassium chloride  40 mEq Oral BID   sodium bicarbonate  1,300 mg Oral TID      Subjective:   Senon Joynes was seen  and examined today.  Still having significant diarrhea, no nausea vomiting or abdominal pain, no fevers.    Objective:   Vitals:   08/05/22 0518 08/05/22 0742 08/05/22 0800 08/05/22 1200  BP: (!) 167/62     Pulse: 63     Resp: 16     Temp:   98.6 F (37 C) 98.1 F (36.7 C)  TempSrc:   Oral Oral  SpO2: 98% 99%    Weight:      Height:        Intake/Output Summary (Last 24 hours) at 08/05/2022 1351 Last data filed at 08/05/2022 0500 Gross per 24 hour  Intake 1449.75 ml  Output 540 ml  Net 909.75 ml     Wt Readings from Last 3 Encounters:  07/29/22 90.3 kg  07/16/22 90.5 kg  07/09/22 91.9 kg    Physical Exam General: Alert and oriented x 3, NAD Cardiovascular: S1 S2 clear, RRR.  Respiratory: CTAB, no wheezing Gastrointestinal: Soft, nontender, nondistended, NBS Ext: no pedal edema bilaterally Neuro: no new deficits Psych: Normal affect     Data Reviewed:  I have personally reviewed following labs    CBC Lab Results  Component Value Date   WBC 3.8 (L) 08/04/2022   RBC 2.58 (L) 08/04/2022   HGB 8.1 (L) 08/04/2022   HCT 24.4 (L) 08/04/2022   MCV 94.6 08/04/2022   MCH 31.4 08/04/2022   PLT 184 08/04/2022   MCHC 33.2 08/04/2022   RDW 18.1 (H) 08/04/2022   LYMPHSABS 2.0 07/29/2022   MONOABS 1.0 07/29/2022   EOSABS 0.2 07/29/2022   BASOSABS 0.0 07/29/2022     Last metabolic panel Lab Results  Component Value Date   NA 136 08/05/2022   K 2.6 (LL) 08/05/2022   CL 115 (H) 08/05/2022   CO2 16 (L) 08/05/2022   BUN <5 (L) 08/05/2022   CREATININE 0.89 08/05/2022   GLUCOSE 93 08/05/2022   GFRNONAA >60 08/05/2022   GFRAA >60 12/27/2015   CALCIUM 5.7 (LL) 08/05/2022   PHOS 2.0 (L) 08/05/2022  PROT 4.7 (L) 08/01/2022   ALBUMIN 2.5 (L) 08/01/2022   LABGLOB 2.3 07/16/2022   AGRATIO 0.9 04/14/2022   BILITOT 0.8 08/01/2022   ALKPHOS 68 08/01/2022   AST 22 08/01/2022   ALT 9 08/01/2022   ANIONGAP 5 08/05/2022    CBG (last 3)  Recent Labs     08/04/22 2359 08/05/22 0551 08/05/22 1144  GLUCAP 95 105* 115*      Coagulation Profile: No results for input(s): "INR", "PROTIME" in the last 168 hours.   Radiology Studies: I have personally reviewed the imaging studies  DG Abd 1 View  Result Date: 08/04/2022 CLINICAL DATA:  Pseudo-obstruction of colon. EXAM: ABDOMEN - 1 VIEW COMPARISON:  August 02, 2022. FINDINGS: There appears to be continued gaseous distention of the colon, although it appears to be slightly decreased compared to prior exam. No small bowel dilatation is noted. IMPRESSION: Slightly decreased gaseous distention of the colon suggesting mildly improved ileus. Electronically Signed   By: Lupita Raider M.D.   On: 08/04/2022 12:36       Taylan Marez M.D. [facility] 08/05/2022, 1:51 PM  Available via Epic secure chat 7am-7pm After 7 pm, please refer to night coverage provider listed on amion.

## 2022-08-05 NOTE — Progress Notes (Signed)
Occupational Therapy Treatment Patient Details Name: Frank Hernandez MRN: [id number] DOB: 1940-09-21 Today's Date: 08/05/2022   History of present illness 82 year old gentleman history of remote bladder cancer, pt underwent IM nail R femur d/t pathological fx +Bx on 04/17/22 hospitalized diagnosed with multiple myeloma, CKD, hypertension recently started on treatment for multiple myeloma and admitted 07/29/22 with respiratory symptoms, found to have new pulmonary embolism, also with abdominal distention for several days and several months of diarrhea with incontinence, found to have significantly dilated colon (per GI, Colonic distention, 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  Pt had just had a bed bath with nursing and was likely fatigued at beginning of OT session. Pt asked to walk and is motivated but with decreased awareness of his impairments and decreased safety awareness as pt sat impulsively and without warning after stand #2.  Patient remains limited by generalized pain, generalized weakness and decreased activity tolerance along with deficits noted below. Pt continues to demonstrate good rehab potential and would benefit from continued skilled OT to increase safety and independence with ADLs and functional transfers to allow pt to return home safely and reduce caregiver burden and fall risk.    Recommendations for follow up therapy are one component of a multi-disciplinary discharge planning process, led by the attending physician.  Recommendations may be updated based on patient status, additional functional criteria and insurance authorization.    Assistance Recommended at Discharge Frequent or constant Supervision/Assistance  Patient can return home with the following  A lot of help with  bathing/dressing/bathroom;Assistance with cooking/housework;Direct supervision/assist for medications management;Assist for transportation;Help with stairs or ramp for entrance;A lot of help with walking and/or transfers   Equipment Recommendations  None recommended by OT    Recommendations for Other Services      Precautions / Restrictions Precautions Precautions: Fall Precaution Comments: Flexi Seal Restrictions Weight Bearing Restrictions: No Other Position/Activity Restrictions: enteric precautions       Mobility Bed Mobility Overal bed mobility: Needs Assistance Bed Mobility: Supine to Sit, Sit to Supine   Sidelying to sit: Max assist, +2 for safety/equipment   Sit to supine: +2 for safety/equipment, +2 for physical assistance, Max assist        Transfers                         Balance Overall balance assessment: Needs assistance Sitting-balance support: Feet supported, No upper extremity supported Sitting balance-Leahy Scale: Poor Sitting balance - Comments: Required external assistance for sitting balance.   Standing balance support: Bilateral upper extremity supported, Reliant on assistive device for balance, During functional activity Standing balance-Leahy Scale: Poor                             ADL either performed or assessed with clinical judgement   ADL Overall ADL's : Needs assistance/impaired Eating/Feeding: Set up;Bed level                       Toilet Transfer: Rolling walker (2 wheels);Cueing for sequencing;Cueing for safety;Moderate assistance;+2 for safety/equipment;+2 for physical assistance Toilet Transfer Details (indicate cue type and reason): Pt declined any ADLs stating, "I want to walk." Pt stood from EOB to RW with Mod Assist of 2 people. Pt stood  briefly while Nursing readjusted mesh underwear to keep flexiseal in place. Pt fatigued and sat quickly to EOB without warning. After rest break, pt stood 2nd time  from EOB to RW with Mod As of 2. Pt slowly took lateral steps to EOB with Max As for use of RW and inability to step with LT foot, but rather performed heel toe pivots. Pt lowered back to EOB with Mod As for safety. Pt declined further activity stating he was too fatigued.         Functional mobility during ADLs: Moderate assistance;Rolling walker (2 wheels);+2 for physical assistance;+2 for safety/equipment;Cueing for sequencing;Cueing for safety      Extremity/Trunk Assessment Upper Extremity Assessment Upper Extremity Assessment: Generalized weakness RUE Deficits / Details: observed R UE swelling and RN aware   Lower Extremity Assessment Lower Extremity Assessment: Generalized weakness   Cervical / Trunk Assessment Cervical / Trunk Assessment: Kyphotic    Vision       Perception     Praxis      Cognition Arousal/Alertness: Awake/alert Behavior During Therapy: WFL for tasks assessed/performed Overall Cognitive Status: Within Functional Limits for tasks assessed                                 General Comments: Does seem to have decreased awareness of his impairements and awareness is emergent.        Exercises      Shoulder Instructions       General Comments      Pertinent Vitals/ Pain       Pain Assessment Pain Assessment: Faces Faces Pain Scale: Hurts little more Pain Location: Peri area while RN adjusted flexiseal. Pain Descriptors / Indicators: Tender, Sore Pain Intervention(s): Monitored during session, Limited activity within patient's tolerance, Repositioned  Home Living                                          Prior Functioning/Environment              Frequency  Min 1X/week        Progress Toward Goals  OT Goals(current goals can now be found in the care plan section)  Progress towards OT goals: Progressing toward goals     Plan Frequency remains appropriate    Co-evaluation                  AM-PAC OT "6 Clicks" Daily Activity     Outcome Measure   Help from another person eating meals?: None Help from another person taking care of personal grooming?: A Little Help from another person toileting, which includes using toliet, bedpan, or urinal?: Total Help from another person bathing (including washing, rinsing, drying)?: A Lot Help from another person to put on and taking off regular upper body clothing?: A Little Help from another person to put on and taking off regular lower body clothing?: A Lot 6 Click Score: 15    End of Session Equipment Utilized During Treatment: Gait belt;Rolling walker (2 wheels)  OT Visit Diagnosis: Muscle weakness (generalized) (M62.81);Pain;Other symptoms and signs involving cognitive function;Unsteadiness on feet (R26.81) Pain - part of body:  (peri area)   Activity Tolerance Patient limited by fatigue   Patient Left in bed;with call bell/phone within reach;with nursing/sitter in room   Nurse Communication Other (comment) (RN assisted  with all mobility and line management)        Time: 4098-1191 OT Time Calculation (min): 27 min  Charges: OT General Charges $OT Visit: 1 Visit OT Treatments $Therapeutic Activity: 23-37 mins  Victorino Dike, OT Acute Rehab Services Office: [phone] 08/05/2022  Theodoro Clock 08/05/2022, 12:46 PM

## 2022-08-06 ENCOUNTER — Inpatient Hospital Stay (HOSPITAL_COMMUNITY): Payer: Medicare PPO

## 2022-08-06 ENCOUNTER — Other Ambulatory Visit: Payer: Medicare PPO

## 2022-08-06 ENCOUNTER — Ambulatory Visit: Payer: Medicare PPO

## 2022-08-06 ENCOUNTER — Ambulatory Visit: Payer: Medicare PPO | Admitting: Hematology

## 2022-08-06 DIAGNOSIS — R0600 Dyspnea, unspecified: Secondary | ICD-10-CM | POA: Diagnosis not present

## 2022-08-06 DIAGNOSIS — I2699 Other pulmonary embolism without acute cor pulmonale: Secondary | ICD-10-CM | POA: Diagnosis not present

## 2022-08-06 DIAGNOSIS — I2693 Single subsegmental pulmonary embolism without acute cor pulmonale: Secondary | ICD-10-CM | POA: Diagnosis not present

## 2022-08-06 DIAGNOSIS — K56609 Unspecified intestinal obstruction, unspecified as to partial versus complete obstruction: Secondary | ICD-10-CM | POA: Diagnosis not present

## 2022-08-06 DIAGNOSIS — C9000 Multiple myeloma not having achieved remission: Secondary | ICD-10-CM | POA: Diagnosis not present

## 2022-08-06 DIAGNOSIS — I10 Essential (primary) hypertension: Secondary | ICD-10-CM

## 2022-08-06 DIAGNOSIS — R197 Diarrhea, unspecified: Secondary | ICD-10-CM | POA: Diagnosis not present

## 2022-08-06 DIAGNOSIS — K5981 Ogilvie syndrome: Secondary | ICD-10-CM | POA: Diagnosis not present

## 2022-08-06 DIAGNOSIS — Z7189 Other specified counseling: Secondary | ICD-10-CM | POA: Diagnosis not present

## 2022-08-06 LAB — BASIC METABOLIC PANEL
Anion gap: 9 (ref 5–15)
BUN: <5 mg/dL — ABNORMAL LOW (ref 8–23)
CO2: 20 mmol/L — ABNORMAL LOW (ref 22–32)
Calcium: 5.9 mg/dL — CL (ref 8.9–10.3)
Chloride: 110 mmol/L (ref 98–111)
Creatinine, Ser: 0.87 mg/dL (ref 0.61–1.24)
GFR, Estimated: >60 mL/min (ref >60)
Glucose, Bld: 90 mg/dL (ref 70–99)
Potassium: 3 mmol/L — ABNORMAL LOW (ref 3.5–5.1)
Sodium: 139 mmol/L (ref 135–145)

## 2022-08-06 LAB — HEPATIC FUNCTION PANEL
ALT: 5 U/L (ref 0–44)
AST: 25 U/L (ref 15–41)
Albumin: 2.1 g/dL — ABNORMAL LOW (ref 3.5–5.0)
Alkaline Phosphatase: 66 U/L (ref 38–126)
Bilirubin, Direct: 0.2 mg/dL (ref 0.0–0.2)
Indirect Bilirubin: 0.2 mg/dL — ABNORMAL LOW (ref 0.3–0.9)
Total Bilirubin: 0.4 mg/dL (ref 0.3–1.2)
Total Protein: 4.3 g/dL — ABNORMAL LOW (ref 6.5–8.1)

## 2022-08-06 LAB — CBC
HCT: 23.6 % — ABNORMAL LOW (ref 39.0–52.0)
Hemoglobin: 8.1 g/dL — ABNORMAL LOW (ref 13.0–17.0)
MCH: 31 pg (ref 26.0–34.0)
MCHC: 34.3 g/dL (ref 30.0–36.0)
MCV: 90.4 fL (ref 80.0–100.0)
Platelets: 200 10*3/uL (ref 150–400)
RBC: 2.61 MIL/uL — ABNORMAL LOW (ref 4.22–5.81)
RDW: 16.7 % — ABNORMAL HIGH (ref 11.5–15.5)
WBC: 4.3 10*3/uL (ref 4.0–10.5)
nRBC: 0 % (ref 0.0–0.2)

## 2022-08-06 LAB — GLUCOSE, CAPILLARY
Glucose-Capillary: 100 mg/dL — ABNORMAL HIGH (ref 70–99)
Glucose-Capillary: 85 mg/dL (ref 70–99)
Glucose-Capillary: 88 mg/dL (ref 70–99)
Glucose-Capillary: 88 mg/dL (ref 70–99)

## 2022-08-06 LAB — MAGNESIUM: Magnesium: 1.7 mg/dL (ref 1.7–2.4)

## 2022-08-06 LAB — PHOSPHORUS: Phosphorus: 2.3 mg/dL — ABNORMAL LOW (ref 2.5–4.6)

## 2022-08-06 MED ORDER — CHOLESTYRAMINE LIGHT 4 G PO PACK
4.0000 g | PACK | Freq: Four times a day (QID) | ORAL | Status: DC
  Administered 2022-08-06 – 2022-08-09 (×13): 4 g via ORAL
  Filled 2022-08-06 (×16): qty 1

## 2022-08-06 MED ORDER — CALCIUM GLUCONATE-NACL 1-0.675 GM/50ML-% IV SOLN
1.0000 g | Freq: Once | INTRAVENOUS | Status: AC
  Administered 2022-08-06: 1000 mg via INTRAVENOUS
  Filled 2022-08-06: qty 50

## 2022-08-06 MED ORDER — POTASSIUM PHOSPHATES 15 MMOLE/5ML IV SOLN
30.0000 mmol | Freq: Once | INTRAVENOUS | Status: AC
  Administered 2022-08-06: 30 mmol via INTRAVENOUS
  Filled 2022-08-06: qty 10

## 2022-08-06 MED ORDER — POTASSIUM PHOSPHATES 15 MMOLE/5ML IV SOLN
15.0000 mmol | Freq: Once | INTRAVENOUS | Status: DC
  Filled 2022-08-06: qty 5

## 2022-08-06 MED ORDER — ZINC OXIDE 20 % EX OINT
TOPICAL_OINTMENT | CUTANEOUS | Status: DC | PRN
  Administered 2022-08-16: 1 via TOPICAL
  Filled 2022-08-06 (×2): qty 28.35

## 2022-08-06 MED ORDER — CHOLESTYRAMINE LIGHT 4 G PO PACK: 4.0000 g | PACK | Freq: Four times a day (QID) | ORAL | Status: DC

## 2022-08-06 MED ORDER — ALBUMIN HUMAN 25 % IV SOLN
25.0000 g | Freq: Four times a day (QID) | INTRAVENOUS | Status: AC
  Administered 2022-08-06 – 2022-08-07 (×3): 25 g via INTRAVENOUS
  Filled 2022-08-06 (×3): qty 100

## 2022-08-06 NOTE — Progress Notes (Addendum)
[facility]                                                                              Frank Hernandez, is a 82 y.o. male, DOB - [birth year], ZOX:[id number] Admit date - 07/29/2022    Outpatient Primary MD for the patient is [name] (Inactive)  LOS - 8  days  No chief complaint on file.      Brief summary   Patient is a 82 year old with hypertension, prediabetes, stage IIIa CKD, pathological fracture of the right femoral, multiple myeloma presented to ED with wheezing and rhonchi. Patient has been taking antibiotics for cellulitis.  He has had worsening of his right foot wound.  He reported abdominal distention with watery stool.   Chest x-ray showed no active cardiopulmonary disease.  CTA chest showed left lower lobe segmental PE with minimal clot burden.  CT abdomen with dilated colon with air-fluid level seen and scattered colonic diverticula.  Transition seen in the area of the new left inguinal hernia involving the sigmoid colon.  No pneumatosis.  Small bowel and stomach are known dilated but there is an enteric tube.  Separate large known right inguinal hernia involving the small bowel and significant portion of the urinary bladder.  Lytic bone lesion consistent with known history of myeloma.   Patient was admitted with PE and a small bowel obstruction.  Surgery and GI consulted. 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 Diarrhea thought to be related to daratumnunab.    Assessment & Plan    Principal Problem: Acute left lower lobe segmental pulmonary embolism,  acute DVT left femoral vein -CTA chest showed left lower lobe segmental PE, minimal clot burden -Venous Doppler consistent with acute DVT involving left common femoral vein, indeterminate age DVT. -Continue Lovenox -2D echo showed normal RV function  SBO; Pseudo-obstruction Ogilvie Syndrome, colonic distention: -Presents with obstructive symptoms, CT  abdomen pelvis showed Dilated colon with air-fluid level seen and scattered colonic diverticula. Transition seen in the area of the new left inguinal hernia involving the sigmoid colon. No pneumatosis, free air or fluid. Small bowel and stomach are nondilated -NG tube  has been removed by General Surgery.  -Per GI: Ogilvie's/Pseudo-obstruction.  -Avoid Imodium. Unable to do Neostigmine, because it can worsen diarrhea.  -Diet advanced to soft solids   Diarrhea; - C diff negative. Having issues with Diarrhea since last hospitalization.  -GI pathogen  positive for enteropathogenic E coli.  -Completed Azithro for 3 days.  -Thought to be related to Chemo: Daratumumab.  -Avoid Imodium due to Pseudo-obstruction Syndrome -Still having significant amount of diarrhea, increased cholestyramine to twice daily on 6/6 with no significant improvement -Requested GI to reevaluate  -IV albumin x 3    NAG Metabolic Acidosis: in setting of GI losses -Continue bicarb drip  Hypomagnesemia, hypokalemia -Continue potassium replacement  Hypophosphatemia -K-Phos x1 IV  Hypocalcemia -Replaced IV   Cellulitis of both lower extremity: Completed 5 days Ancef.    Prolonged QT   Replete electrolytes. QT 450    Hypertension:  -BP currently stable   Pre-Diabetes: On diet controlled.    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 -Continue as needed Tylenol   Multiple myeloma Needs to follow up with oncology out patient.    Pressure Injury Documentation: Pressure Injury 03/29/22 Buttocks Right Stage 1 -  Intact skin with non-blanchable redness of a localized area usually over a bony prominence. (Active)  03/29/22 2140  Location:  Buttocks  Location Orientation: Right  Staging: Stage 1 -  Intact skin with non-blanchable redness of a localized area usually over a bony prominence.  Wound Description (Comments):   Present on Admission: Yes     Pressure Injury 05/28/22 Heel Right Stage 3 -  Full thickness tissue loss. Subcutaneous fat may be visible but bone, tendon or muscle are NOT exposed. Open wound, broken from pressure & dry skin (Active)  05/28/22 1400  Location: Heel  Location Orientation: Right  Staging: Stage 3 -  Full thickness tissue loss. Subcutaneous fat may be visible but bone, tendon or muscle are NOT exposed.  Wound Description (Comments): Open wound, broken from pressure & dry skin  Present on Admission: Yes  Dressing Type Foam - Lift dressing to assess site every shift 08/05/22 2005   Mild to moderate protein calorie malnutrition, hypoalbuminemia Nutrition Problem: Inadequate oral intake Etiology: acute illness (small bowel obstruction) Signs/Symptoms: energy intake < 75% for > 7 days Interventions: Refer to RD note for recommendations, Boost Breeze, MVI Estimated body mass index is 29.39 kg/m as calculated from the following:   Height as of this encounter: 5\' 9"  (1.753 m).   Weight as of this encounter: 90.3 kg.  Code Status: Full code DVT Prophylaxis:  Lovenox   Level of Care: Level of care: Stepdown Family Communication: Updated patient Disposition Plan:      Remains inpatient appropriate:      Procedures:  2D echo  Consultants:   General surgery GI  Antimicrobials:   Anti-infectives (From admission, onward)    Start     Dose/Rate Route Frequency Ordered Stop   08/01/22 1400  ceFAZolin (ANCEF) IVPB 2g/100 mL premix        2 g 200 mL/hr over 30 Minutes Intravenous Every 8 hours 08/01/22 0824 08/03/22 0700   07/31/22 1600  azithromycin (ZITHROMAX) tablet 500 mg        500 mg Oral Daily 07/31/22 1446 08/02/22 1013   07/31/22 1000  vancomycin (VANCOREADY) IVPB 1500 mg/300 mL   Status:  Discontinued        1,500 mg 150 mL/hr over 120 Minutes Intravenous Every 48 hours 07/29/22 1243 07/30/22 0755   07/30/22 2200  acyclovir (ZOVIRAX) tablet 400 mg        400 mg Oral Daily at bedtime 07/30/22 1843     07/30/22 1400  ceFAZolin (ANCEF) IVPB 2g/100 mL premix  Status:  Discontinued        2 g 200 mL/hr over 30 Minutes Intravenous Every 8 hours 07/30/22 0843 08/01/22 0824   07/30/22 0900  doxycycline (VIBRAMYCIN) 100 mg in sodium chloride 0.9 % 250 mL IVPB  Status:  Discontinued        100 mg 125 mL/hr over 120 Minutes Intravenous Every 12 hours 07/30/22 0809 07/31/22 1445   07/29/22 1800  ceFEPIme (MAXIPIME) 2 g in sodium chloride 0.9 % 100 mL IVPB  Status:  Discontinued  2 g 200 mL/hr over 30 Minutes Intravenous Every 12 hours 07/29/22 1234 07/30/22 0841   07/29/22 1230  metroNIDAZOLE (FLAGYL) IVPB 500 mg  Status:  Discontinued        500 mg 100 mL/hr over 60 Minutes Intravenous Every 12 hours 07/29/22 1205 07/30/22 0909   07/29/22 0930  piperacillin-tazobactam (ZOSYN) IVPB 3.375 g        3.375 g 100 mL/hr over 30 Minutes Intravenous  Once 07/29/22 0920 07/29/22 1038   07/29/22 0930  vancomycin (VANCOREADY) IVPB 2000 mg/400 mL        2,000 mg 200 mL/hr over 120 Minutes Intravenous  Once 07/29/22 1610 07/29/22 1246          Medications  acyclovir  400 mg Oral QHS   budesonide (PULMICORT) nebulizer solution  0.25 mg Nebulization BID   Chlorhexidine Gluconate Cloth  6 each Topical Daily   cholestyramine light  4 g Oral Q12H   enoxaparin (LOVENOX) injection  1 mg/kg Subcutaneous Q12H   feeding supplement  1 Container Oral TID BM   leptospermum manuka honey  1 Application Topical Daily   multivitamin with minerals  1 tablet Oral Daily   pantoprazole (PROTONIX) IV  40 mg Intravenous Q12H   potassium chloride  40 mEq Oral BID   sodium bicarbonate  1,300 mg Oral TID      Subjective:   Frank Hernandez was seen and examined today.  Continues to have  significant amount of diarrhea, has Flexi-Seal.  No nausea vomiting or abdominal pain.  No fevers.   Objective:   Vitals:   08/06/22 0804 08/06/22 0805 08/06/22 0815 08/06/22 1000  BP:    (!) 120/98  Pulse:    65  Resp:    16  Temp:   97.8 F (36.6 C)   TempSrc:   Oral   SpO2: 99% 100%  97%  Weight:      Height:        Intake/Output Summary (Last 24 hours) at 08/06/2022 1041 Last data filed at 08/06/2022 9604 Gross per 24 hour  Intake 2428.98 ml  Output 1756 ml  Net 672.98 ml     Wt Readings from Last 3 Encounters:  07/29/22 90.3 kg  07/16/22 90.5 kg  07/09/22 91.9 kg   Physical Exam General: Alert and oriented, NAD Cardiovascular: S1 S2 clear, RRR.  Respiratory: CTAB, no wheezing Gastrointestinal: Soft, nontender, nondistended, NBS Ext: no pedal edema bilaterally Neuro: no new deficits Psych: Normal affect    Data Reviewed:  I have personally reviewed following labs    CBC Lab Results  Component Value Date   WBC 4.3 08/06/2022   RBC 2.61 (L) 08/06/2022   HGB 8.1 (L) 08/06/2022   HCT 23.6 (L) 08/06/2022   MCV 90.4 08/06/2022   MCH 31.0 08/06/2022   PLT 200 08/06/2022   MCHC 34.3 08/06/2022   RDW 16.7 (H) 08/06/2022   LYMPHSABS 2.0 07/29/2022   MONOABS 1.0 07/29/2022   EOSABS 0.2 07/29/2022   BASOSABS 0.0 07/29/2022     Last metabolic panel Lab Results  Component Value Date   NA 139 08/06/2022   K 3.0 (L) 08/06/2022   CL 110 08/06/2022   CO2 20 (L) 08/06/2022   BUN <5 (L) 08/06/2022   CREATININE 0.87 08/06/2022   GLUCOSE 90 08/06/2022   GFRNONAA >60 08/06/2022   GFRAA >60 12/27/2015   CALCIUM 5.9 (LL) 08/06/2022   PHOS 2.3 (L) 08/06/2022   PROT 4.3 (L) 08/06/2022   ALBUMIN 2.1 (L) 08/06/2022  LABGLOB 2.3 07/16/2022   AGRATIO 0.9 04/14/2022   BILITOT 0.4 08/06/2022   ALKPHOS 66 08/06/2022   AST 25 08/06/2022   ALT 5 08/06/2022   ANIONGAP 9 08/06/2022    CBG (last 3)  Recent Labs    08/05/22 1800 08/05/22 2336 08/06/22 0527   GLUCAP 98 110* 88      Coagulation Profile: No results for input(s): "INR", "PROTIME" in the last 168 hours.   Radiology Studies: I have personally reviewed the imaging studies  No results found.     Thad Ranger M.D. [facility] 08/06/2022, 10:41 AM  Available via Epic secure chat 7am-7pm After 7 pm, please refer to night coverage provider listed on amion.

## 2022-08-06 NOTE — Progress Notes (Signed)
[name]  HEMATOLOGY/ONCOLOGY INPATIENT PROGRESS NOTE  Date of Service: 08/06/2022  Inpatient Attending: .Cathren Harsh, MD   SUBJECTIVE Patient was seen in hematology/medical oncology follow-up.  He is well-known to me from the oncology clinic.  He has a history of hypertension, prediabetes, stage III chronic kidney disease and had presented with a pathological fracture of the right femur and was diagnosed with multiple myeloma. He recently has been on daratumumab/Dex for treatment of his multiple myeloma and Velcade has been on hold due to his poor overall p.o. intake. He has been admitted with severe watery diarrhea and abdominal distention.  Recent cellulitis with use of antibiotics. Patient had a CT of the abdomen which showed dilated colon with air-fluid levels with a transition in the area of the new left inguinal hernia involving the sigmoid colon.  Separate large right inguinal hernia. He has continued to have persistent significant high-volume diarrhea and has been followed by gastroenterology.  Patient notes that he was having diarrhea even before the daratumumab.  We discussed that daratumumab in general is not known to cause much diarrhea.  Even on daratumumab was held previously his diarrhea did not really improve. Patient was also noted to have acute DVT of left femoral vein.  Left lower lobe segmental pulmonary embolism.   OBJECTIVE:  NAD  PHYSICAL EXAMINATION: . Vitals:   08/06/22 1700 08/06/22 1800 08/06/22 2000 08/06/22 2039  BP:  137/72 (!) 145/60 (!) 145/60  Pulse: 70 76 64 71  Resp: (!) 26 (!) 29 20 (!) 21  Temp:      TempSrc:      SpO2: 98% 100% 100% 100%  Weight:      Height:       Filed Weights   07/29/22 0822  Weight: 199 lb (90.3 kg)   .Body mass index is 29.39 kg/m.  GENERAL:alert, in no acute distress and comfortable LYMPH:  no palpable lymphadenopathy in the cervical, axillary or inguinal LUNGS: clear to auscultation with normal respiratory  effort HEART: regular rate & rhythm,  no murmurs and no lower extremity edema ABDOMEN: abdomen soft, non-tender, normoactive bowel sounds  NEURO: no focal motor/sensory deficits  MEDICAL HISTORY:  Past Medical History:  Diagnosis Date   Bleeding from the nose    Hypertension    Pre-diabetes     SURGICAL HISTORY: Past Surgical History:  Procedure Laterality Date   COLONOSCOPY     INTRAMEDULLARY (IM) NAIL INTERTROCHANTERIC Left 06/11/2020   Procedure: INTRAMEDULLARY (IM) NAIL INTERTROCHANTRIC;  Surgeon: Roby Lofts, MD;  Location: [facility];  Service: Orthopedics;  Laterality: Left;   INTRAMEDULLARY (IM) NAIL INTERTROCHANTERIC Right 04/17/2022   Procedure: INTRAMEDULLARY (IM) NAIL INTERTROCHANTERIC;  Surgeon: London Sheer, MD;  Location: [facility];  Service: Orthopedics;  Laterality: Right;   KNEE ARTHROSCOPY Bilateral    POLYPECTOMY      SOCIAL HISTORY: Social History   Socioeconomic History   Marital status: Single    Spouse name: Not on file   Number of children: Not on file   Years of education: Not on file   Highest education level: Not on file  Occupational History   Not on file  Tobacco Use   Smoking status: Never   Smokeless tobacco: Never  Vaping Use   Vaping Use: Never used  Substance and Sexual Activity   Alcohol use: No    Alcohol/week: 0.0 standard drinks of alcohol   Drug use: No   Sexual activity: Not on file  Other Topics Concern   Not  on file  Social History Narrative   Not on file   Social Determinants of Health   Financial Resource Strain: Not on file  Food Insecurity: No Food Insecurity (07/30/2022)   Hunger Vital Sign    Worried About Running Out of Food in the Last Year: Never true    Ran Out of Food in the Last Year: Never true  Recent Concern: Food Insecurity - Food Insecurity Present (05/28/2022)   Hunger Vital Sign    Worried About Running Out of Food in the Last Year: Never true    Ran Out of Food in the Last Year: Sometimes true   Transportation Needs: No Transportation Needs (07/30/2022)   PRAPARE - Administrator, Civil Service (Medical): No    Lack of Transportation (Non-Medical): No  Recent Concern: Transportation Needs - Unmet Transportation Needs (05/28/2022)   PRAPARE - Administrator, Civil Service (Medical): Yes    Lack of Transportation (Non-Medical): Yes  Physical Activity: Not on file  Stress: Not on file  Social Connections: Not on file  Intimate Partner Violence: Not At Risk (07/30/2022)   Humiliation, Afraid, Rape, and Kick questionnaire    Fear of Current or Ex-Partner: No    Emotionally Abused: No    Physically Abused: No    Sexually Abused: No    FAMILY HISTORY: Family History  Problem Relation Age of Onset   Colon cancer Neg Hx     ALLERGIES:  has No Known Allergies.  MEDICATIONS:  Scheduled Meds:  acyclovir  400 mg Oral QHS   budesonide (PULMICORT) nebulizer solution  0.25 mg Nebulization BID   Chlorhexidine Gluconate Cloth  6 each Topical Daily   cholestyramine light  4 g Oral QID   enoxaparin (LOVENOX) injection  1 mg/kg Subcutaneous Q12H   feeding supplement  1 Container Oral TID BM   leptospermum manuka honey  1 Application Topical Daily   multivitamin with minerals  1 tablet Oral Daily   pantoprazole (PROTONIX) IV  40 mg Intravenous Q12H   potassium chloride  40 mEq Oral BID   sodium bicarbonate  1,300 mg Oral TID   Continuous Infusions:  albumin human 25 g (08/06/22 1939)   sodium bicarbonate 150 mEq in sterile water 1,150 mL infusion 75 mL/hr at 08/06/22 1939   PRN Meds:.acetaminophen **OR** acetaminophen, HYDROmorphone (DILAUDID) injection, metoprolol tartrate, mouth rinse, prochlorperazine, zinc oxide  REVIEW OF SYSTEMS:    10 Point review of Systems was done is negative except as noted above.   LABORATORY DATA:  I have reviewed the data as listed  .    Latest Ref Rng & Units 08/06/2022    2:59 AM 08/04/2022    3:13 AM 08/03/2022    8:39 AM   CBC  WBC 4.0 - 10.5 K/uL 4.3  3.8  4.3   Hemoglobin 13.0 - 17.0 g/dL 8.1  8.1  8.7   Hematocrit 39.0 - 52.0 % 23.6  24.4  25.5   Platelets 150 - 400 K/uL 200  184  194     .    Latest Ref Rng & Units 08/06/2022    2:59 AM 08/05/2022    6:59 PM 08/05/2022    1:10 PM  CMP  Glucose 70 - 99 mg/dL 90  202    BUN 8 - 23 mg/dL <5  <5    Creatinine 5.42 - 1.24 mg/dL 7.06  2.37    Sodium 628 - 145 mmol/L 139  138    Potassium  3.5 - 5.1 mmol/L 3.0  3.1  2.9   Chloride 98 - 111 mmol/L 110  112    CO2 22 - 32 mmol/L 20  18    Calcium 8.9 - 10.3 mg/dL 5.9  6.1    Total Protein 6.5 - 8.1 g/dL 4.3     Total Bilirubin 0.3 - 1.2 mg/dL 0.4     Alkaline Phos 38 - 126 U/L 66     AST 15 - 41 U/L 25     ALT 0 - 44 U/L 5       RADIOGRAPHIC STUDIES: I have personally reviewed the radiological images as listed and agreed with the findings in the report. DG Abd 1 View  Result Date: 08/06/2022 CLINICAL DATA:  Ileus EXAM: ABDOMEN - 1 VIEW COMPARISON:  Radiographs 08/04/2022 FINDINGS: Decreased gaseous distention of the colon compared to 08/04/2022. No definite small bowel dilation. Postoperative changes both femurs. IMPRESSION: Decreased gaseous distention of the colon compared with 08/04/2022. Electronically Signed   By: Minerva Fester M.D.   On: 08/06/2022 20:48   DG Abd 1 View  Result Date: 08/04/2022 CLINICAL DATA:  Pseudo-obstruction of colon. EXAM: ABDOMEN - 1 VIEW COMPARISON:  August 02, 2022. FINDINGS: There appears to be continued gaseous distention of the colon, although it appears to be slightly decreased compared to prior exam. No small bowel dilatation is noted. IMPRESSION: Slightly decreased gaseous distention of the colon suggesting mildly improved ileus. Electronically Signed   By: Lupita Raider M.D.   On: 08/04/2022 12:36   DG Abd 1 View  Result Date: 08/02/2022 CLINICAL DATA:  Evaluate ileus EXAM: ABDOMEN - 1 VIEW COMPARISON:  07/30/2022 FINDINGS: Persistent gaseous dilatation of the colon is  noted. Cecum is dilated to 10.6 cm. No free air is seen. Postsurgical changes in the proximal femurs are seen. IMPRESSION: Gaseous dilatation of the colon most consistent with a colonic ileus. Electronically Signed   By: Alcide Clever M.D.   On: 08/02/2022 19:56   DG Chest Port 1 View  Result Date: 07/30/2022 CLINICAL DATA:  Shortness of breath. EXAM: PORTABLE CHEST 1 VIEW COMPARISON:  Chest radiograph dated Jul 29, 2022 FINDINGS: The heart size and mediastinal contours are within normal limits. Both lungs are clear. Elevation of the left hemidiaphragm with distended bowel loops in the left upper quadrant. The visualized skeletal structures are unremarkable. IMPRESSION: 1. No acute cardiopulmonary process 2. Elevation of the left hemidiaphragm with distended bowel loops in the left upper quadrant. Electronically Signed   By: Larose Hires D.O.   On: 07/30/2022 22:40   DG KNEE 3 VIEW RIGHT  Result Date: 07/30/2022 CLINICAL DATA:  Right knee pain. EXAM: RIGHT KNEE - 3 VIEW COMPARISON:  Right femur radiographs 06/04/2022 FINDINGS: There is diffuse decreased bone mineralization. Severe patellofemoral joint space narrowing and peripheral osteophytosis. No joint effusion. Moderate posterior proximal tibial degenerative spurring. Moderate to severe medial joint space narrowing and peripheral osteophytosis. Minimal peripheral lateral chronic degenerative spurring without significant joint space narrowing. A calcified density measuring 6 mm again overlies the distal anterior lateral thigh soft tissues, nonspecific. No acute fracture is seen. No dislocation. Partial visualization of femoral intramedullary nail. 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 Electronically Signed   By: Neita Garnet M.D.   On: 07/30/2022 17:59   VAS [place] LOWER EXTREMITY VENOUS (DVT)  Result Date: 07/30/2022  Lower Venous DVT Study Patient Name:  Frank Hernandez  Date of Exam:   07/30/2022  Medical  Rec #: [id number]            Accession #:    [id number] Date of Birth: [birth year]             Patient Gender: M Patient Age:   20 years Exam Location:  [facility] Procedure:      VAS [place] LOWER EXTREMITY VENOUS (DVT) Referring Phys: Jon Billings REGALADO --------------------------------------------------------------------------------  Indications: Edema.  Risk Factors: Confirmed PE. Anticoagulation: Heparin. Limitations: Body habitus, poor ultrasound/tissue interface and patient positioning. Comparison Study: No prior studies. Performing Technologist: Chanda Busing RVT  Examination Guidelines: A complete evaluation includes B-mode imaging, spectral Doppler, color Doppler, and power Doppler as needed of all accessible portions of each vessel. Bilateral testing is considered an integral part of a complete examination. Limited examinations for reoccurring indications may be performed as noted. The reflux portion of the exam is performed with the patient in reverse Trendelenburg.  +---------+---------------+---------+-----------+----------+-------------------+ RIGHT    CompressibilityPhasicitySpontaneityPropertiesThrombus Aging      +---------+---------------+---------+-----------+----------+-------------------+ CFV      Full           Yes      Yes                                      +---------+---------------+---------+-----------+----------+-------------------+ SFJ      Full                                                             +---------+---------------+---------+-----------+----------+-------------------+ FV Prox  Full                                                             +---------+---------------+---------+-----------+----------+-------------------+ FV Mid                  Yes      Yes                                      +---------+---------------+---------+-----------+----------+-------------------+ FV Distal               Yes      Yes                                       +---------+---------------+---------+-----------+----------+-------------------+ PFV      Full                                                             +---------+---------------+---------+-----------+----------+-------------------+ POP      Full           Yes      Yes                                      +---------+---------------+---------+-----------+----------+-------------------+  PTV      Full                                                             +---------+---------------+---------+-----------+----------+-------------------+ PERO                                                  Not well visualized +---------+---------------+---------+-----------+----------+-------------------+   +---------+---------------+---------+-----------+----------+-------------------+ LEFT     CompressibilityPhasicitySpontaneityPropertiesThrombus Aging      +---------+---------------+---------+-----------+----------+-------------------+ CFV      Partial        Yes      Yes                  Acute               +---------+---------------+---------+-----------+----------+-------------------+ SFJ      Full                                                             +---------+---------------+---------+-----------+----------+-------------------+ FV Prox  Partial        Yes      No                   Acute               +---------+---------------+---------+-----------+----------+-------------------+ FV Mid   Partial        Yes      Yes                  Age Indeterminate   +---------+---------------+---------+-----------+----------+-------------------+ FV Distal                                             Not well visualized +---------+---------------+---------+-----------+----------+-------------------+ PFV      Full           Yes      Yes                                       +---------+---------------+---------+-----------+----------+-------------------+ POP      Partial        Yes      Yes                  Age Indeterminate   +---------+---------------+---------+-----------+----------+-------------------+ PTV      Full                                                             +---------+---------------+---------+-----------+----------+-------------------+ PERO  Not well visualized +---------+---------------+---------+-----------+----------+-------------------+ Thrombus located in the common femoral vein is noted to be in the distal segment.    Summary: RIGHT: - There is no evidence of deep vein thrombosis in the lower extremity. However, portions of this examination were limited- see technologist comments above.  - No cystic structure found in the popliteal fossa.  LEFT: - Findings consistent with acute deep vein thrombosis involving the left common femoral vein, and left femoral vein. - Findings consistent with age indeterminate deep vein thrombosis involving the left popliteal vein. - No cystic structure found in the popliteal fossa.  *See table(s) above for measurements and observations. Electronically signed by Sherald Hess MD on 07/30/2022 at 4:42:31 PM.    Final    ECHOCARDIOGRAM COMPLETE  Result Date: 07/30/2022    ECHOCARDIOGRAM REPORT   Patient Name:   Frank Hernandez Date of Exam: 07/30/2022 Medical Rec #:  [id number]           Height:       69.0 in Accession #:    [id number]          Weight:       199.0 lb Date of Birth:  [birth year]            BSA:          2.062 m Patient Age:    82 years            BP:           156/65 mmHg Patient Gender: M                   HR:           74 bpm. Exam Location:  Inpatient Procedure: 2D Echo, Color Doppler, Cardiac Doppler and Intracardiac            Opacification Agent Indications:    Pulmonary Embolus  History:        Patient has prior history of  Echocardiogram examinations, most                 recent 04/22/2022. CKD, Signs/Symptoms:Syncope; Risk                 Factors:Hypertension.  Sonographer:    Milbert Coulter Referring Phys: [id number] DAVID MANUEL ORTIZ  Sonographer Comments: Technically difficult study due to poor echo windows, suboptimal parasternal window, suboptimal apical window and no subcostal window. Image acquisition challenging due to patient body habitus and Image acquisition challenging due to uncooperative patient. IMPRESSIONS  1. Left ventricular ejection fraction, by estimation, is 65 to 70%. The left ventricle has normal function. The left ventricle has no regional wall motion abnormalities. Left ventricular diastolic parameters are consistent with Grade I diastolic dysfunction (impaired relaxation).  2. Right ventricular systolic function is normal. The right ventricular size is normal.  3. The mitral valve is normal in structure. Trivial mitral valve regurgitation. No evidence of mitral stenosis.  4. The aortic valve is normal in structure. Aortic valve regurgitation is trivial. No aortic stenosis is present.  5. The inferior vena cava is normal in size with greater than 50% respiratory variability, suggesting right atrial pressure of 3 mmHg. FINDINGS  Left Ventricle: Left ventricular ejection fraction, by estimation, is 65 to 70%. The left ventricle has normal function. The left ventricle has no regional wall motion abnormalities. Definity contrast agent was given IV to delineate the left ventricular  endocardial borders. The left ventricular internal cavity size was normal in size. There is  no left ventricular hypertrophy. Left ventricular diastolic parameters are consistent with Grade I diastolic dysfunction (impaired relaxation). Right Ventricle: The right ventricular size is normal. No increase in right ventricular wall thickness. Right ventricular systolic function is normal. Left Atrium: Left atrial size was normal in size. Right  Atrium: Right atrial size was normal in size. Pericardium: There is no evidence of pericardial effusion. Mitral Valve: The mitral valve is normal in structure. Trivial mitral valve regurgitation. No evidence of mitral valve stenosis. Tricuspid Valve: The tricuspid valve is normal in structure. Tricuspid valve regurgitation is mild . No evidence of tricuspid stenosis. Aortic Valve: The aortic valve is normal in structure. Aortic valve regurgitation is trivial. Aortic regurgitation PHT measures 434 msec. No aortic stenosis is present. Aortic valve mean gradient measures 6.0 mmHg. Aortic valve peak gradient measures 10.8 mmHg. Aortic valve area, by VTI measures 2.35 cm. Pulmonic Valve: The pulmonic valve was normal in structure. Pulmonic valve regurgitation is not visualized. No evidence of pulmonic stenosis. Aorta: The aortic root is normal in size and structure. Venous: The inferior vena cava is normal in size with greater than 50% respiratory variability, suggesting right atrial pressure of 3 mmHg. IAS/Shunts: No atrial level shunt detected by color flow Doppler.  LEFT VENTRICLE PLAX 2D LVIDd:         3.30 cm LVIDs:         2.60 cm LV PW:         1.50 cm LV IVS:        1.60 cm LVOT diam:     2.20 cm LV SV:         64 LV SV Index:   31 LVOT Area:     3.80 cm  RIGHT VENTRICLE TAPSE (M-mode): 1.8 cm AORTIC VALVE AV Area (Vmax):    2.53 cm AV Area (Vmean):   2.59 cm AV Area (VTI):     2.35 cm AV Vmax:           164.00 cm/s AV Vmean:          107.000 cm/s AV VTI:            0.271 m AV Peak Grad:      10.8 mmHg AV Mean Grad:      6.0 mmHg LVOT Vmax:         109.20 cm/s LVOT Vmean:        72.850 cm/s LVOT VTI:          0.167 m LVOT/AV VTI ratio: 0.62 AI PHT:            434 msec  AORTA Ao Root diam: 4.30 cm TRICUSPID VALVE TR Peak grad:   42.0 mmHg TR Vmax:        324.00 cm/s  SHUNTS Systemic VTI:  0.17 m Systemic Diam: 2.20 cm Chilton Si MD Electronically signed by Chilton Si MD Signature Date/Time:  07/30/2022/3:47:49 PM    Final    DG Abd 1 View  Result Date: 07/30/2022 CLINICAL DATA:  Small bowel obstruction. EXAM: ABDOMEN - 1 VIEW COMPARISON:  Jul 29, 2022. FINDINGS: Stable marked gaseous distention of colon is noted. No definite small bowel dilatation is noted. Distal tip of nasogastric tube is seen in expected position of proximal stomach. IMPRESSION: Stable gaseous distention of colon most consistent with ileus or distal colonic obstruction. Electronically Signed   By: Lupita Raider M.D.   On: 07/30/2022 09:04   DG Ankle Complete Right  Result Date: 07/29/2022 CLINICAL DATA:  Ankle wound. EXAM: RIGHT ANKLE - COMPLETE 3+ VIEW COMPARISON:  None Available. FINDINGS: There is no evidence of fracture, dislocation, or joint effusion. There is no evidence of arthropathy or other focal bone abnormality. Vascular calcifications are noted. No definite lytic destruction is noted. No definite soft tissue wound is noted. IMPRESSION: No acute abnormality. Electronically Signed   By: Lupita Raider M.D.   On: 07/29/2022 10:39   CT Angio Chest PE W and/or Wo Contrast  Result Date: 07/29/2022 CLINICAL DATA:  Dyspnea, shortness of breath. 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 EXAM: CT ANGIOGRAPHY CHEST CT ABDOMEN AND PELVIS WITH CONTRAST TECHNIQUE: Multidetector CT imaging of the chest was performed using the standard protocol during bolus administration of intravenous contrast. Multiplanar CT image reconstructions and MIPs were obtained to evaluate the vascular anatomy. Multidetector CT imaging of the abdomen and pelvis was performed using the standard protocol during bolus administration of intravenous contrast. RADIATION DOSE REDUCTION: This exam was performed according to the departmental dose-optimization program which includes automated exposure control, adjustment of the mA and/or kV according to patient size and/or use of iterative reconstruction technique. CONTRAST:   OMNIPAQUE IOHEXOL 350 MG/ML SOLN COMPARISON:  Standard CT with contrast 04/14/2022. Chest x-ray earlier 07/29/2018. PET-CT scan 05/20/2018 FINDINGS: CTA CHEST FINDINGS Cardiovascular: There is some mild breathing motion throughout the examination. Heart is nonenlarged. No pericardial effusion. Coronary artery calcifications are seen. The thoracic aorta has a overall normal caliber with some scattered atherosclerotic plaque. There is some pulsation artifact along the ascending aorta. There is some enlargement of the main pulmonary artery. Please correlate for pulmonary artery hypertension. There is a left lower lobe pulmonary embolism along segmental vessel. No other substantial pulmonary emboli are identified. Minimal clot burden. Mediastinum/Nodes: No specific abnormal lymph node enlargement identified in the axillary regions, hilum or mediastinum. Preserved thyroid gland. Enteric tube along the esophagus with tip extending into the upper stomach. Lungs/Pleura: Breathing motion. No consolidation, pneumothorax or effusion. Mild linear opacity at the bases likely scar or atelectasis. Musculoskeletal: Curvature and degenerative changes along the spine. Review of the MIP images confirms the above findings. CT ABDOMEN and PELVIS FINDINGS Hepatobiliary: Stable small hepatic benign-appearing cystic lesions. No specific imaging follow-up. Gallbladder is contracted. Patent portal vein. Pancreas: Mild global pancreatic atrophy.  No pancreatic mass. Spleen: Normal in size without focal abnormality. Adrenals/Urinary Tract: The adrenal glands are preserved. 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 No specific imaging follow-up. No collecting system dilatation. The ureters have normal course and caliber extending down to the bladder. Bladder has preserved contour but a portion of the right side of the bladder extends into the right separate inguinal hernia. Stomach/Bowel: On this non oral contrast exam the  stomach is nondilated. Small bowel is nondilated. Large bowel is dilated. Right-sided has dimensions approaching up to 10 cm in diameter. There are air-fluid levels. The transverse colon is slightly less dilated as is the descending colon with some scattered diverticula along the descending and sigmoid colon. The previous left inguinal hernia which contain only fat, now involves the sigmoid colon and there is a transition from the dilatation to the nondilated at the level of the inguinal hernia. Please correlate with reduced stability. There is a separate large right inguinal hernia which was seen previous involving mostly the bladder but also significant small bowel and mesenteric fat. Again the small bowel is nondilated. Vascular/Lymphatic: Aortic atherosclerosis. No enlarged abdominal or pelvic lymph nodes. Reproductive: Prostate is unremarkable. Other:  Anasarca.  No free intra-abdominal air or free fluid. Musculoskeletal: Streak artifact related to the patient's bilateral dynamic hip screws. Degenerative changes of the spine and pelvis. Once again there are scattered lytic lesions along the pelvis and lumbar spine as previously described. Review of the MIP images confirms the above findings. Critical Value/emergent results were called by telephone at the time of interpretation on 07/29/2022 at 7:34 am to provider Vivien Rossetti , who verbally acknowledged these results. IMPRESSION: Left lower lobe segmental pulmonary embolism. Overall minimal clot burden. Breathing motion. Dilated colon with air-fluid level seen and scattered colonic diverticula. Transition seen in the area of the new left inguinal hernia involving the sigmoid colon. Please correlate for reduce ability. No pneumatosis, free air or fluid. Small bowel and stomach are nondilated but there is an enteric tube. Separate large known right inguinal hernia involving small bowel and significant portion of the urinary bladder. Enteric tube in the stomach.  Lytic bone lesions consistent with known history of myeloma. Electronically Signed   By: Karen Kays M.D.   On: 07/29/2022 10:38   CT ABDOMEN PELVIS W CONTRAST  Result Date: 07/29/2022 CLINICAL DATA:  Dyspnea, shortness of breath. 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 EXAM: CT ANGIOGRAPHY CHEST CT ABDOMEN AND PELVIS WITH CONTRAST TECHNIQUE: Multidetector CT imaging of the chest was performed using the standard protocol during bolus administration of intravenous contrast. Multiplanar CT image reconstructions and MIPs were obtained to evaluate the vascular anatomy. Multidetector CT imaging of the abdomen and pelvis was performed using the standard protocol during bolus administration of intravenous contrast. RADIATION DOSE REDUCTION: This exam was performed according to the departmental dose-optimization program which includes automated exposure control, adjustment of the mA and/or kV according to patient size and/or use of iterative reconstruction technique. CONTRAST:  OMNIPAQUE IOHEXOL 350 MG/ML SOLN COMPARISON:  Standard CT with contrast 04/14/2022. Chest x-ray earlier 07/29/2018. PET-CT scan 05/20/2018 FINDINGS: CTA CHEST FINDINGS Cardiovascular: There is some mild breathing motion throughout the examination. Heart is nonenlarged. No pericardial effusion. Coronary artery calcifications are seen. The thoracic aorta has a overall normal caliber with some scattered atherosclerotic plaque. There is some pulsation artifact along the ascending aorta. There is some enlargement of the main pulmonary artery. Please correlate for pulmonary artery hypertension. There is a left lower lobe pulmonary embolism along segmental vessel. No other substantial pulmonary emboli are identified. Minimal clot burden. Mediastinum/Nodes: No specific abnormal lymph node enlargement identified in the axillary regions, hilum or mediastinum. Preserved thyroid gland. Enteric tube along the esophagus with tip  extending into the upper stomach. Lungs/Pleura: Breathing motion. No consolidation, pneumothorax or effusion. Mild linear opacity at the bases likely scar or atelectasis. Musculoskeletal: Curvature and degenerative changes along the spine. Review of the MIP images confirms the above findings. CT ABDOMEN and PELVIS FINDINGS Hepatobiliary: Stable small hepatic benign-appearing cystic lesions. No specific imaging follow-up. Gallbladder is contracted. Patent portal vein. Pancreas: Mild global pancreatic atrophy.  No pancreatic mass. Spleen: Normal in size without focal abnormality. Adrenals/Urinary Tract: The adrenal glands are preserved. 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 No specific imaging follow-up. No collecting system dilatation. The ureters have normal course and caliber extending down to the bladder. Bladder has preserved contour but a portion of the right side of the bladder extends into the right separate inguinal hernia. Stomach/Bowel: On this non oral contrast exam the stomach is nondilated. Small bowel is nondilated. Large bowel is dilated. Right-sided has dimensions approaching up to 10 cm  in diameter. There are air-fluid levels. The transverse colon is slightly less dilated as is the descending colon with some scattered diverticula along the descending and sigmoid colon. The previous left inguinal hernia which contain only fat, now involves the sigmoid colon and there is a transition from the dilatation to the nondilated at the level of the inguinal hernia. Please correlate with reduced stability. There is a separate large right inguinal hernia which was seen previous involving mostly the bladder but also significant small bowel and mesenteric fat. Again the small bowel is nondilated. Vascular/Lymphatic: Aortic atherosclerosis. No enlarged abdominal or pelvic lymph nodes. Reproductive: Prostate is unremarkable. Other: Anasarca.  No free intra-abdominal air or free fluid.  Musculoskeletal: Streak artifact related to the patient's bilateral dynamic hip screws. Degenerative changes of the spine and pelvis. Once again there are scattered lytic lesions along the pelvis and lumbar spine as previously described. Review of the MIP images confirms the above findings. Critical Value/emergent results were called by telephone at the time of interpretation on 07/29/2022 at 7:34 am to provider Vivien Rossetti , who verbally acknowledged these results. IMPRESSION: Left lower lobe segmental pulmonary embolism. Overall minimal clot burden. Breathing motion. Dilated colon with air-fluid level seen and scattered colonic diverticula. Transition seen in the area of the new left inguinal hernia involving the sigmoid colon. Please correlate for reduce ability. No pneumatosis, free air or fluid. Small bowel and stomach are nondilated but there is an enteric tube. Separate large known right inguinal hernia involving small bowel and significant portion of the urinary bladder. Enteric tube in the stomach. Lytic bone lesions consistent with known history of myeloma. Electronically Signed   By: Karen Kays M.D.   On: 07/29/2022 10:38   DG Abd 1 View  Result Date: 07/29/2022 CLINICAL DATA:  NG tube placement EXAM: ABDOMEN - 1 VIEW COMPARISON:  Same day CT abdomen/pelvis FINDINGS: The enteric catheter tip and sidehole project over the expected location of the stomach. There is marked gaseous distention of the large bowel as seen on same-day CT. There is no definite free intraperitoneal air, within the confines of supine technique. IMPRESSION: Enteric catheter tip and sidehole project over the stomach. Electronically Signed   By: Lesia Hausen M.D.   On: 07/29/2022 10:34   DG Chest 2 View  Result Date: 07/29/2022 CLINICAL DATA:  shortness of breath, wheezing EXAM: CHEST - 2 VIEW COMPARISON:  PET-CT March 21, 24. FINDINGS: The heart size and mediastinal contours are within normal limits. Both lungs are  clear. No visible pleural effusions or pneumothorax. The visualized skeletal structures are unremarkable. Gaseous distension of visualized bowel loops in the upper abdomen. Recommend dedicated KUB to further evaluate. IMPRESSION: 1. No active cardiopulmonary disease. 2. Gaseous distension of visualized bowel loops in the upper abdomen. Recommend dedicated KUB to further evaluate. Electronically Signed   By: Feliberto Harts M.D.   On: 07/29/2022 08:56   LONG TERM MONITOR (3-14 DAYS)  Result Date: 07/10/2022   Predominately sinus rhythm   Rare episodes of very brief non sustained VT.   the fastest was 5 beats with max HR of 210 bpm.   Sinus pauses and high degree AV block occured at night while sleeping .   Rare PVCs Patch Wear Time:  13 days and 21 hours (2024-04-17T19:51:05-398 to 2024-05-01T17:48:31-0400) Patient had a min HR of 22 bpm, max HR of 210 bpm, and avg HR of 65 bpm. Predominant underlying rhythm was Sinus Rhythm. First Degree AV Block was present. 2 Ventricular  Tachycardia runs occurred, the run with the fastest interval lasting 5 beats with a max rate of 210 bpm, the longest lasting 5 beats with an avg rate of 131 bpm. 9 Pauses occurred, the longest lasting 3.9 secs (15 bpm). Some Pauses occurred due to Possible High Grade AV Block, Second Degree AV Block-Mobitz I (Wenckebach) and Non-Conducted SVE(s). 30 episode(s) of AV Block (High Grade and 3rd) occurred, lasting a total of 5 mins 15 secs. Second Degree AV Block-Mobitz I (Wenckebach) was present. Isolated SVEs were rare (<1.0%), and no SVE Couplets or SVE Triplets were present. Isolated VEs were rare (<1.0%), VE Couplets were rare (<1.0%), and no VE Triplets were present. Ventricular Trigeminy was present. Difficulty discerning atrial activity making definitive diagnosis difficult to ascertain. MD notification criteria  for Complete Heart Block met - report posted prior to notification per account request (MA).    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 M spike of 1.2g/dl and elevated Kappa Light chains  BM Bx with 30% abnormal plasma cell and AL Amyloidosis. M spike is down to 0.3g/dl Was on Dara/velcade /dex and Velcade was held due to poor po intake.  #2 Subacute pathologic intertrochanteric fracture of his right femur due to myeloma s/p IM nailing Imaging shows lytic lesion in the area confirming the pathologic nature of the fracture. Patient has not had any trauma or falls leading to the fracture. CT chest abdomen pelvis shows multiple other bony lytic lesions without any other clear source of a primary tumor.    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 Bx from area of pathologic rt hip fracture confirmed daignosis of multiple myeloma.   #4 normocytic anemia.  Hemoglobin is varying between 8-9.  Likely from metastatic malignancy/myeloma  #5 Acute PE and left femoral DVT on anticoag  # 6 Possible bowel obstruction - Ogilvie's pseudoobstruction vs related to inguinal hernia  # 7 Persistent Large Volume diarrhea -- unclear etiology but concerning for c diff though initial test neg. Unlikely from Daratumumab -- not known to cause much diarrhea and last dose was 2 weeks ago - Another possible concern could be AL Amyloidosis of the bowels-- did have AL Amyloid on the BM Bx  # 8 Multiple electrolyte deficiencies due to diarrhea PLAN -Myeloma labs showed good response -daratumumab last dose 2 weeks ago unlikel cause of diarrhea. -appreciate input from hospital medicine, GI and surgery teams. -continue to hold Myeloma treatment while in the hospital -patients and his wife questions were answered in details  .The total time spent in the appointment was 35 minutes* .  All of the patient's questions were answered with apparent satisfaction. The patient knows to call the clinic with  any problems, questions or concerns.   Wyvonnia Lora MD MS AAHIVMS [facility] Hematology/Oncology Physician [facility]  .*Total Encounter Time as defined by the Centers for Medicare and Medicaid Services includes, in addition to the face-to-face time of a patient visit (documented in the note above) non-face-to-face time: obtaining and reviewing outside history, ordering and reviewing medications, tests or procedures, care coordination (communications with other health care professionals or caregivers) and documentation in the medical record.

## 2022-08-06 NOTE — Progress Notes (Signed)
Physical Therapy Treatment Patient Details Name: Frank Hernandez MRN: [id number] DOB: 1940-11-30 Today's Date: 08/06/2022   History of Present Illness 82 year old gentleman history of remote bladder cancer, pt underwent IM nail R femur d/t pathological fx +Bx on 04/17/22 hospitalized diagnosed with multiple myeloma, CKD, hypertension recently started on treatment for multiple myeloma and admitted 07/29/22 with respiratory symptoms, found to have new pulmonary embolism, also with abdominal distention for several days and several months of diarrhea with incontinence, found to have significantly dilated colon (per GI, Colonic distention, 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  Following all directions.  Stated he wife was brining him some watermelon today and looking forward to that.  Max c/o weakness overall. General bed mobility comments: side to side rolling for Peri Care.  Max amounts of watery loose stools leaking around Pt's flexi seal.  Assisted to EOB required + 2 ModMax Assist using bed pad to complete scooting.  Once upright, pt able to static sit at Supervision level.  Vitals all WNL.  No dizziness.  c/o weakness/fatigue.  c/o B UE edema.  Limited B UE movement and poor grip due to edema. General transfer comment: pt able to rise from elevated bed + 2 side by side assist for safety/multiple lines.  Used B platform EVA walker for increased support. General Gait Details: distance limited to 2 feet due to fatigue/weakness.  Required + 2 asisst for safety such that recliner was following as well as multiple lines. Positioned in recliner to comfort.  Pt will need ST Rehab at SNF to address mobility and functional decline prior to safely returning home.   Recommendations for follow up therapy are one component of a multi-disciplinary discharge planning process, led by the attending physician.  Recommendations may be updated based  on patient status, additional functional criteria and insurance authorization.  Follow Up Recommendations  Can patient physically be transported by private vehicle: No    Assistance Recommended at Discharge Frequent or constant Supervision/Assistance  Patient can return home with the following A lot of help with walking and/or transfers;A lot of help with bathing/dressing/bathroom;Assistance with cooking/housework;Assist for transportation;Help with stairs or ramp for entrance   Equipment Recommendations  None recommended by PT    Recommendations for Other Services       Precautions / Restrictions Precautions Precautions: Fall Precaution Comments: Flexi Seal Restrictions Weight Bearing Restrictions: No Other Position/Activity Restrictions: enteric precautions     Mobility  Bed Mobility Overal bed mobility: Needs Assistance Bed Mobility: Supine to Sit, Rolling Rolling: Mod assist, Max assist, +2 for physical assistance   Supine to sit: Mod assist, Max assist, +2 for safety/equipment, +2 for physical assistance     General bed mobility comments: side to side rolling for Peri Care.  Max amounts of watery loose stools leaking around Pt's flexi seal.  Assisted to EOB required + 2 ModMax Assist using bed pad to complete scooting.  Once upright, pt able to static sit at Supervision level.  Vitals all WNL.  No dizziness.  c/o weakness/fatigue.  c/o B UE edema.  Limited B UE movement and poor grip due to edema.    Transfers Overall transfer level: Needs assistance Equipment used: Bilateral platform walker Transfers: Sit to/from Stand Sit to Stand: Mod assist, Max assist, +2 physical assistance, +2 safety/equipment           General transfer comment: pt able to rise from elevated bed + 2 side by  side assist for safety/multiple lines.  Used B platform EVA walker for increased support.    Ambulation/Gait Ambulation/Gait assistance: Mod assist, Max assist, +2  safety/equipment Gait Distance (Feet): 2 Feet Assistive device: Fara Boros Gait Pattern/deviations: Step-through pattern, Decreased stride length Gait velocity: decreased     General Gait Details: distance limited to 2 feet due to fatigue/weakness.  Required + 2 asisst for safety such that recliner was following as well as multiple lines.   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  Stated he wife was brining him some watermelon today and looking forward to that.  Max c/o weakness overall.        Exercises      General Comments        Pertinent Vitals/Pain Pain Assessment Pain Assessment: No/denies pain Pain Location: Peri area during hygiene Pain Descriptors / Indicators: Tender, Sore Pain Intervention(s): Monitored during session, Repositioned    Home Living                          Prior Function            PT Goals (current goals can now be found in the care plan section) Progress towards PT goals: Progressing toward goals    Frequency    Min 1X/week      PT Plan Current plan remains appropriate    Co-evaluation              AM-PAC PT "6 Clicks" Mobility   Outcome Measure  Help needed turning from your back to your side while in a flat bed without using bedrails?: A Lot Help needed moving from lying on your back to sitting on the side of a flat bed without using bedrails?: A Lot Help needed moving to and from a bed to a chair (including a wheelchair)?: A Lot Help needed standing up from a chair using your arms  (e.g., wheelchair or bedside chair)?: A Lot Help needed to walk in hospital room?: A Lot Help needed climbing 3-5 steps with a railing? : Total 6 Click Score: 11    End of Session Equipment Utilized During Treatment: Gait belt Activity Tolerance: Patient tolerated treatment well Patient left: in chair;with call bell/phone within reach;with chair alarm set;with nursing/sitter in room Nurse Communication: Mobility status PT Visit Diagnosis: Difficulty in walking, not elsewhere classified (R26.2);Muscle weakness (generalized) (M62.81)     Time: 1610-9604 PT Time Calculation (min) (ACUTE ONLY): 39 min  Charges:  $Gait Training: 8-22 mins $Therapeutic Activity: 23-37 mins                     Felecia Shelling  PTA Acute  Rehabilitation Services Office M-F  [phone]  

## 2022-08-06 NOTE — Progress Notes (Addendum)
Progress Note   LOS: 8 days   Chief Complaint: profuse diarrhea (re-consult)   Subjective   RN reports 2000cc stool output in 12 hours per flexi seal. RN states stool is starting to become more bulky than previous episodes. Patient feels he isn't having many bowel movements, though he is a poor historian. Denies abdominal pain, nausea, and vomiting. Tolerating diet without difficulty.    Objective   Vital signs in last 24 hours: Temp:  [97.8 F (36.6 C)-98.9 F (37.2 C)] 97.8 F (36.6 C) (06/07 0815) Pulse Rate:  [65-76] 65 (06/07 1000) Resp:  [15-24] 16 (06/07 1000) BP: (101-145)/(35-98) 120/98 (06/07 1000) SpO2:  [95 %-100 %] 97 % (06/07 1000) Last BM Date : 08/05/22 Last BM recorded by nurses in past 5 days Stool Type: Type 7 (Liquid consistency with no solid pieces) (08/05/2022 10:12 PM)  General:   male in no acute distress  Heart:  Regular rate and rhythm; no murmurs Pulm: Clear anteriorly; no wheezing Abdomen: soft, nondistended, normal bowel sounds in all quadrants. Nontender without guarding. No organomegaly appreciated. Extremities:  No edema Neurologic:  Alert and  oriented x4;  No focal deficits.  Psych:  Cooperative. Normal mood and affect.  Intake/Output from previous day: 06/06 0701 - 06/07 0700 In: 2429 [I.V.:1889.6; IV Piggyback:539.4] Out: 1758 [Urine:359; Stool:1399] Intake/Output this shift: No intake/output data recorded.  Studies/Results: No results found.  Lab Results: Recent Labs    08/04/22 0313 08/06/22 0259  WBC 3.8* 4.3  HGB 8.1* 8.1*  HCT 24.4* 23.6*  PLT 184 200   BMET Recent Labs    08/05/22 0308 08/05/22 1310 08/05/22 1859 08/06/22 0259  NA 136  --  138 139  K 2.6* 2.9* 3.1* 3.0*  CL 115*  --  112* 110  CO2 16*  --  18* 20*  GLUCOSE 93  --  110* 90  BUN <5*  --  <5* <5*  CREATININE 0.89  --  0.91 0.87  CALCIUM 5.7*  --  6.1* 5.9*   LFT Recent Labs    08/06/22 0259  PROT 4.3*  ALBUMIN 2.1*  AST 25  ALT 5   ALKPHOS 66  BILITOT 0.4  BILIDIR 0.2  IBILI 0.2*   PT/INR No results for input(s): "LABPROT", "INR" in the last 72 hours.   Scheduled Meds:  acyclovir  400 mg Oral QHS   budesonide (PULMICORT) nebulizer solution  0.25 mg Nebulization BID   Chlorhexidine Gluconate Cloth  6 each Topical Daily   cholestyramine light  4 g Oral Q12H   enoxaparin (LOVENOX) injection  1 mg/kg Subcutaneous Q12H   feeding supplement  1 Container Oral TID BM   leptospermum manuka honey  1 Application Topical Daily   multivitamin with minerals  1 tablet Oral Daily   pantoprazole (PROTONIX) IV  40 mg Intravenous Q12H   potassium chloride  40 mEq Oral BID   sodium bicarbonate  1,300 mg Oral TID   Continuous Infusions:  potassium PHOSPHATE IVPB (in mmol) 30 mmol (08/06/22 0754)   sodium bicarbonate 150 mEq in sterile water 1,150 mL infusion 75 mL/hr at 08/06/22 8469      Patient profile:   82 year old male with multiple myeloma, currently receiving treatment, admitted with respiratory symptoms, found to have new pulmonary embolism, also with abdominal distention for several days and several months of diarrhea with incontinence, found to have significantly dilated colon. Initially, colonic dilation thought to be secondary to possible obstruction, possibly involving his large inguinal hernia, but  given his ongoing passage of stool and lack of significant pain, obstruction was felt to be very unlikely.  Given the significant colonic dilation with minimal symptoms, a pseudoobstruction may be more likely.  GI pathogen panel positive for E. Coli.  Patient received 3-day course of azithromycin with no improvement.  Last seen by GI team 6/4 and at that time patient's abdominal distention and loose stools improved clinically on cholestyramine.  Cholestyramine was increased to twice daily and hospitalist reconsulted GI due to continued diarrhea. Patient had c diff 2 months ago, however, recent stool study 7 days ago was  negative.   Impression:   Diarrhea -E. coli on GI pathogen panel -No leukocytosis -Potassium 3.0 -Calcium 5.9 -Phosphorus 2.3 -Magnesium 1.7 -Abdominal x-ray 6/5 shows decreased distention of colon suggesting improvement in ileus - 2000cc stool output in 12 hours Patient continuing to have increased diarrhea. Starting to bulk up. Continuing to struggle with electrolyte abnormalities. Suspect continued diarrhea resulting in worsening electrolyte abnormalities, however, with being unable to normalize his electrolytes since admission it could also be the culprit of the diarrhea. Many other medications could be offending agents as well including acyclovir. Patient has only been on cholestyramine 8g per day.  PE  -on lovenox   Plan:   -repeat abdominal xray to ensure continued improvement in colonic dilation - consider repeat C diff stool study and GI pathogen panel (?) - continue to monitor and correct electrolytes. - increase cholestyramine from twice daily to four times daily,(max dose is 24g per day). Patient is currently on 8g per 24 hrs so we will increase to 16g per 24 hrs and see how he responds. - GI will follow  Bayley Leanna Sato  08/06/2022, 11:19 AM     Attending physician's note   I have taken history, reviewed the chart and examined the patient. I performed a substantive portion of this encounter, including complete performance of at least one of the key components, in conjunction with the APP. I agree with the Advanced Practitioner's note, impression and recommendations.   Still with diarrhea Abdo distention is much better  Plan: -Increase cholestyramine -Repeat stool studies -If not better, FS with bx 6/9 to r/o immune mediated diarrhea related to daratumnunab.    Edman Circle, MD [facility] [phone]

## 2022-08-07 ENCOUNTER — Other Ambulatory Visit: Payer: Self-pay

## 2022-08-07 DIAGNOSIS — E876 Hypokalemia: Secondary | ICD-10-CM

## 2022-08-07 DIAGNOSIS — I2699 Other pulmonary embolism without acute cor pulmonale: Secondary | ICD-10-CM | POA: Diagnosis not present

## 2022-08-07 DIAGNOSIS — K56609 Unspecified intestinal obstruction, unspecified as to partial versus complete obstruction: Secondary | ICD-10-CM | POA: Diagnosis not present

## 2022-08-07 DIAGNOSIS — N1831 Chronic kidney disease, stage 3a: Secondary | ICD-10-CM | POA: Diagnosis not present

## 2022-08-07 DIAGNOSIS — I2693 Single subsegmental pulmonary embolism without acute cor pulmonale: Secondary | ICD-10-CM | POA: Diagnosis not present

## 2022-08-07 DIAGNOSIS — C9000 Multiple myeloma not having achieved remission: Secondary | ICD-10-CM | POA: Diagnosis not present

## 2022-08-07 DIAGNOSIS — R0600 Dyspnea, unspecified: Secondary | ICD-10-CM | POA: Diagnosis not present

## 2022-08-07 DIAGNOSIS — K5981 Ogilvie syndrome: Secondary | ICD-10-CM | POA: Diagnosis not present

## 2022-08-07 DIAGNOSIS — R197 Diarrhea, unspecified: Secondary | ICD-10-CM | POA: Diagnosis not present

## 2022-08-07 LAB — C DIFFICILE (CDIFF) QUICK SCRN (NO PCR REFLEX)
C Diff antigen: NEGATIVE
C Diff interpretation: NOT DETECTED
C Diff toxin: NEGATIVE

## 2022-08-07 LAB — GLUCOSE, CAPILLARY
Glucose-Capillary: 78 mg/dL (ref 70–99)
Glucose-Capillary: 109 mg/dL — ABNORMAL HIGH (ref 70–99)
Glucose-Capillary: 101 mg/dL — ABNORMAL HIGH (ref 70–99)
Glucose-Capillary: 121 mg/dL — ABNORMAL HIGH (ref 70–99)

## 2022-08-07 LAB — GASTROINTESTINAL PANEL BY PCR, STOOL (REPLACES STOOL CULTURE)

## 2022-08-07 LAB — CBC
HCT: 21.9 % — ABNORMAL LOW (ref 39.0–52.0)
Hemoglobin: 7.5 g/dL — ABNORMAL LOW (ref 13.0–17.0)
MCH: 31.9 pg (ref 26.0–34.0)
MCHC: 34.2 g/dL (ref 30.0–36.0)
MCV: 93.2 fL (ref 80.0–100.0)
Platelets: 176 10*3/uL (ref 150–400)
RBC: 2.35 MIL/uL — ABNORMAL LOW (ref 4.22–5.81)
RDW: 16.6 % — ABNORMAL HIGH (ref 11.5–15.5)
WBC: 3.7 10*3/uL — ABNORMAL LOW (ref 4.0–10.5)
nRBC: 0 % (ref 0.0–0.2)

## 2022-08-07 LAB — BASIC METABOLIC PANEL
Anion gap: 10 (ref 5–15)
BUN: <5 mg/dL — ABNORMAL LOW (ref 8–23)
CO2: 21 mmol/L — ABNORMAL LOW (ref 22–32)
Calcium: 6.1 mg/dL — CL (ref 8.9–10.3)
Chloride: 106 mmol/L (ref 98–111)
Creatinine, Ser: 1 mg/dL (ref 0.61–1.24)
GFR, Estimated: >60 mL/min (ref >60)
Glucose, Bld: 93 mg/dL (ref 70–99)
Potassium: 2.4 mmol/L — CL (ref 3.5–5.1)
Sodium: 137 mmol/L (ref 135–145)

## 2022-08-07 LAB — PHOSPHORUS: Phosphorus: 2.2 mg/dL — ABNORMAL LOW (ref 2.5–4.6)

## 2022-08-07 LAB — MAGNESIUM: Magnesium: 1.4 mg/dL — ABNORMAL LOW (ref 1.7–2.4)

## 2022-08-07 MED ORDER — CALCIUM GLUCONATE-NACL 1-0.675 GM/50ML-% IV SOLN
1.0000 g | Freq: Two times a day (BID) | INTRAVENOUS | Status: DC
  Administered 2022-08-07 – 2022-08-10 (×8): 1000 mg via INTRAVENOUS
  Filled 2022-08-07 (×9): qty 50

## 2022-08-07 MED ORDER — POTASSIUM CHLORIDE CRYS ER 20 MEQ PO TBCR
40.0000 meq | EXTENDED_RELEASE_TABLET | Freq: Three times a day (TID) | ORAL | Status: DC
  Administered 2022-08-07 – 2022-08-08 (×4): 40 meq via ORAL
  Filled 2022-08-07 (×4): qty 2

## 2022-08-07 MED ORDER — KCL IN DEXTROSE-NACL 40-5-0.45 MEQ/L-%-% IV SOLN
INTRAVENOUS | Status: DC
  Filled 2022-08-07 (×4): qty 1000

## 2022-08-07 MED ORDER — MAGNESIUM SULFATE 50 % IJ SOLN
4.0000 g | Freq: Once | INTRAVENOUS | Status: AC
  Administered 2022-08-07: 4 g via INTRAVENOUS
  Filled 2022-08-07: qty 8

## 2022-08-07 MED ORDER — PNEUMOCOCCAL 20-VAL CONJ VACC 0.5 ML IM SUSY: 0.5000 mL | PREFILLED_SYRINGE | INTRAMUSCULAR | Status: DC | PRN

## 2022-08-07 NOTE — Progress Notes (Signed)
   08/07/22 2136  BiPAP/CPAP/SIPAP  Reason BIPAP/CPAP not in use Other(comment) (07/29/22-Ordered PRN/Currently not in Rm., pt. on R/A.)  BiPAP/CPAP /SiPAP Vitals  Bilateral Breath Sounds Clear;Diminished

## 2022-08-07 NOTE — Progress Notes (Signed)
Progress Note    ASSESSMENT AND PLAN:   Continued diarrhea with electrolyte imbalance- ?etiology. Rpt stool for c diff- neg. Rpt GI pathogens is pending. R/O immune mediated diarrhea d/t daratumnunab or AL amyloidosis (per oncology, assoc with MM) Colonic pseudoobstruction (resolved)  Plan: -Await GI pathogens -If neg, FS with Bx (likely 6/10) d/t scheduling constraints -Agree with PICC line -Monitor and correct electrolytes     SUBJECTIVE   Continued diarrhea despite cholestyramine.  Stool studies negative for C. difficile Denies having any abdominal pain Tolerating p.o. 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    OBJECTIVE:     Vital signs in last 24 hours: Temp:  [97.5 F (36.4 C)-99.1 F (37.3 C)] 97.5 F (36.4 C) (06/08 1129) Pulse Rate:  [60-76] 60 (06/08 1400) Resp:  [13-29] 22 (06/08 1400) BP: (105-152)/(39-72) 144/47 (06/08 1400) SpO2:  [98 %-100 %] 100 % (06/08 1400) FiO2 (%):  [21 %] 21 % (06/07 2039) Weight:  [101.2 kg] 101.2 kg (06/08 1455) Last BM Date : 08/06/22 General:   Alert, well-developed male in NAD EENT:  Normal hearing, non icteric sclera, conjunctive pink.  Heart:  Regular rate and rhythm; no murmur.  No lower extremity edema   Pulm: Normal respiratory effort, lungs CTA bilaterally without wheezes or crackles. Abdomen:  Soft, nondistended, nontender.  Normal bowel sounds, R inguinal hernia.  Has Flexi-Seal tube with diarrhea.       Neurologic:  Alert and  oriented x4;  grossly normal neurologically. Psych:  Pleasant, cooperative.  Normal mood and affect.   Intake/Output from previous day: 06/07 0701 - 06/08 0700 In: 2208.1 [P.O.:420; I.V.:1387.8; IV Piggyback:370.4] Out: 2900 [Urine:950; Stool:1950] Intake/Output this shift: Total I/O In: 273.5 [I.V.:244.8; IV Piggyback:28.7] Out: 600 [Stool:600]  Lab Results: Recent Labs    08/06/22 0259 08/07/22 0724  WBC  4.3 3.7*  HGB 8.1* 7.5*  HCT 23.6* 21.9*  PLT 200 176   BMET Recent Labs    08/05/22 1859 08/06/22 0259 08/07/22 0724  NA 138 139 137  K 3.1* 3.0* 2.4*  CL 112* 110 106  CO2 18* 20* 21*  GLUCOSE 110* 90 93  BUN <5* <5* <5*  CREATININE 0.91 0.87 1.00  CALCIUM 6.1* 5.9* 6.1*   LFT Recent Labs    08/06/22 0259  PROT 4.3*  ALBUMIN 2.1*  AST 25  ALT 5  ALKPHOS 66  BILITOT 0.4  BILIDIR 0.2  IBILI 0.2*   PT/INR No results for input(s): "LABPROT", "INR" in the last 72 hours. Hepatitis Panel No results for input(s): "HEPBSAG", "HCVAB", "HEPAIGM", "HEPBIGM" in the last 72 hours.  DG Abd 1 View  Result Date: 08/06/2022 CLINICAL DATA:  Ileus EXAM: ABDOMEN - 1 VIEW COMPARISON:  Radiographs 08/04/2022 FINDINGS: Decreased gaseous distention of the colon compared to 08/04/2022. No definite small bowel dilation. Postoperative changes both femurs. IMPRESSION: Decreased gaseous distention of the colon compared with 08/04/2022. Electronically Signed   By: Minerva Fester M.D.   On: 08/06/2022 20:48     Principal Problem:   Bowel obstruction (HCC) Active Problems:   Essential hypertension   Pre-diabetes   Hypokalemia   Chronic kidney disease, stage 3a (HCC)   Normocytic anemia   Multiple myeloma not having achieved remission (HCC)   Hypomagnesemia   Prolonged QT interval   Cellulitis of both lower extremities   Acute pulmonary embolism (HCC)   Ogilvie's syndrome     LOS: 9 days  Edman Circle, MD 08/07/2022, 3:03 PM [facility] [phone]

## 2022-08-07 NOTE — Progress Notes (Signed)
[facility]                                                                              Frank Hernandez, is a 82 y.o. male, DOB - 1940/06/27, ZOX:[id number] Admit date - 07/29/2022    Outpatient Primary MD for the patient is [name] (Inactive)  LOS - 9  days  No chief complaint on file.      Brief summary   Patient is a 82 year old with hypertension, prediabetes, stage IIIa CKD, pathological fracture of the right femoral, multiple myeloma presented to ED with wheezing and rhonchi. Patient has been taking antibiotics for cellulitis.  He has had worsening of his right foot wound.  He reported abdominal distention with watery stool.   Chest x-ray showed no active cardiopulmonary disease.  CTA chest showed left lower lobe segmental PE with minimal clot burden.  CT abdomen with dilated colon with air-fluid level seen and scattered colonic diverticula.  Transition seen in the area of the new left inguinal hernia involving the sigmoid colon.  No pneumatosis.  Small bowel and stomach are known dilated but there is an enteric tube.  Separate large known right inguinal hernia involving the small bowel and significant portion of the urinary bladder.  Lytic bone lesion consistent with known history of myeloma.   Patient was admitted with PE and a small bowel obstruction.  Surgery and GI consulted. 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 Diarrhea thought to be related to daratumnunab.    Assessment & Plan    Principal Problem: Acute left lower lobe segmental pulmonary embolism,  acute DVT left femoral vein -CTA chest showed left lower lobe segmental PE, minimal clot burden -Venous Doppler consistent with acute DVT involving left common femoral vein, indeterminate age DVT. -Continue Lovenox -2D echo showed normal RV function  SBO; Pseudo-obstruction Ogilvie Syndrome, colonic distention: -Presents with obstructive symptoms, CT  abdomen pelvis showed Dilated colon with air-fluid level seen and scattered colonic diverticula. Transition seen in the area of the new left inguinal hernia involving the sigmoid colon. No pneumatosis, free air or fluid. Small bowel and stomach are nondilated -NG tube  has been removed by General Surgery.  -Per GI: Ogilvie's/Pseudo-obstruction.  -Avoid Imodium. Unable to do Neostigmine, because it can worsen diarrhea.  -Continue soft solids as tolerated   Diarrhea; - C diff negative. Having issues with Diarrhea since last hospitalization.  -GI pathogen  positive for enteropathogenic E coli, completed azithromycin for 3 days.  -Thought to be related to Chemo: Daratumumab however seen by oncology today, unlikely due to daratumumab, last dose was 2 weeks ago and not known to cause much diarrhea.  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    Prolonged QT   Replete electrolytes. QT 450    Hypertension:  -BP currently stable   Pre-Diabetes: On diet controlled.    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 -Continue as needed Tylenol   Multiple myeloma -Oncology following   Pressure Injury Documentation: Pressure Injury 03/29/22 Buttocks Right Stage 1 -  Intact skin with non-blanchable redness of a localized area usually over a bony prominence. (Active)  03/29/22 2140  Location: Buttocks  Location Orientation: Right  Staging: Stage 1 -  Intact skin with non-blanchable redness of a localized area usually over a bony prominence.  Wound Description (Comments):   Present on Admission: Yes     Pressure Injury 05/28/22 Heel Right Stage 3 -  Full thickness tissue loss. Subcutaneous fat may be visible but bone, tendon or muscle are NOT exposed. Open wound, broken from pressure & dry skin (Active)  05/28/22 1400  Location: Heel  Location Orientation: Right  Staging: Stage 3 -  Full thickness tissue loss. Subcutaneous fat may be visible but bone, tendon or muscle are NOT exposed.  Wound Description (Comments): Open wound, broken from pressure & dry skin  Present on Admission: Yes  Dressing Type Foam - Lift dressing to assess site every shift 08/06/22 2000   Mild to moderate protein calorie malnutrition, hypoalbuminemia Nutrition Problem: Inadequate oral intake Etiology: acute illness (small bowel obstruction) Signs/Symptoms: energy intake < 75% for > 7 days Interventions: Refer to RD note for recommendations, Boost Breeze, MVI Estimated body mass index is 29.39 kg/m as calculated from the following:   Height as of this encounter: 5\' 9"  (1.753 m).   Weight as of this encounter: 90.3 kg.  Code Status: Full code DVT Prophylaxis:  Lovenox   Level of Care: Level of care: Stepdown Family Communication: Updated patient Disposition Plan:      Remains inpatient appropriate:      Procedures:  2D echo  Consultants:   General surgery GI  Antimicrobials:   Anti-infectives (From admission, onward)    Start     Dose/Rate Route  Frequency Ordered Stop   08/01/22 1400  ceFAZolin (ANCEF) IVPB 2g/100 mL premix        2 g 200 mL/hr over 30 Minutes Intravenous Every 8 hours 08/01/22 0824 08/03/22 0700   07/31/22 1600  azithromycin (ZITHROMAX) tablet 500 mg        500 mg Oral Daily 07/31/22 1446 08/02/22 1013   07/31/22 1000  vancomycin (VANCOREADY) IVPB 1500 mg/300 mL  Status:  Discontinued        1,500 mg 150 mL/hr over 120 Minutes Intravenous Every 48 hours 07/29/22 1243 07/30/22 0755   07/30/22 2200  acyclovir (ZOVIRAX) tablet 400 mg        400 mg Oral Daily at bedtime 07/30/22 1843     07/30/22 1400  ceFAZolin (ANCEF) IVPB 2g/100 mL premix  Status:  Discontinued        2 g 200 mL/hr over 30 Minutes Intravenous Every 8 hours 07/30/22 0843 08/01/22 0824   07/30/22 0900  doxycycline (VIBRAMYCIN) 100 mg in sodium chloride  0.9 % 250 mL IVPB  Status:  Discontinued        100 mg 125 mL/hr over 120 Minutes Intravenous Every 12 hours 07/30/22 0809 07/31/22 1445   07/29/22 1800  ceFEPIme (MAXIPIME) 2 g in sodium chloride 0.9 % 100 mL IVPB  Status:  Discontinued        2 g 200 mL/hr over 30 Minutes Intravenous Every 12 hours 07/29/22 1234 07/30/22 0841   07/29/22 1230  metroNIDAZOLE (FLAGYL) IVPB 500 mg  Status:  Discontinued        500 mg 100 mL/hr over 60 Minutes Intravenous Every 12 hours 07/29/22 1205 07/30/22 0909   07/29/22 0930  piperacillin-tazobactam (ZOSYN) IVPB 3.375 g        3.375 g 100 mL/hr over 30 Minutes Intravenous  Once 07/29/22 0920 07/29/22 1038   07/29/22 0930  vancomycin (VANCOREADY) IVPB 2000 mg/400 mL        2,000 mg 200 mL/hr over 120 Minutes Intravenous  Once 07/29/22 1610 07/29/22 1246          Medications  acyclovir  400 mg Oral QHS   budesonide (PULMICORT) nebulizer solution  0.25 mg Nebulization BID   Chlorhexidine Gluconate Cloth  6 each Topical Daily   cholestyramine light  4 g Oral QID   enoxaparin (LOVENOX) injection  1 mg/kg Subcutaneous Q12H   feeding supplement  1 Container  Oral TID BM   leptospermum manuka honey  1 Application Topical Daily   multivitamin with minerals  1 tablet Oral Daily   pantoprazole (PROTONIX) IV  40 mg Intravenous Q12H   potassium chloride  40 mEq Oral TID   sodium bicarbonate  1,300 mg Oral TID      Subjective:   Frank Hernandez was seen and examined today.  Patient poor historian, denies any specific complaints, states that he is feeling better.  No acute nausea vomiting or abdominal pain, continues to have diarrhea.  No fevers.    Objective:   Vitals:   08/07/22 0400 08/07/22 0600 08/07/22 0800 08/07/22 0846  BP: (!) 132/39 (!) 121/54 (!) 132/48   Pulse: 60 60 63   Resp: 14 18 20    Temp:   98.3 F (36.8 C)   TempSrc:   Oral   SpO2: 100% 100% 100% 100%  Weight:      Height:        Intake/Output Summary (Last 24 hours) at 08/07/2022 1105 Last data filed at 08/07/2022 0630 Gross per 24 hour  Intake 2208.12 ml  Output 2900 ml  Net -691.88 ml     Wt Readings from Last 3 Encounters:  07/29/22 90.3 kg  07/16/22 90.5 kg  07/09/22 91.9 kg    Physical Exam General: Alert and oriented x 3, NAD Cardiovascular: S1 S2 clear, RRR.  Respiratory: CTAB, no wheezing Gastrointestinal: Soft, nontender, nondistended, NBS Ext: no pedal edema bilaterally Neuro: no new deficits Psych: Normal affect   Data Reviewed:  I have personally reviewed following labs    CBC Lab Results  Component Value Date   WBC 3.7 (L) 08/07/2022   RBC 2.35 (L) 08/07/2022   HGB 7.5 (L) 08/07/2022   HCT 21.9 (L) 08/07/2022   MCV 93.2 08/07/2022   MCH 31.9 08/07/2022   PLT 176 08/07/2022   MCHC 34.2 08/07/2022   RDW 16.6 (H) 08/07/2022   LYMPHSABS 2.0 07/29/2022   MONOABS 1.0 07/29/2022   EOSABS 0.2 07/29/2022   BASOSABS 0.0 07/29/2022     Last metabolic panel Lab Results  Component  Value Date   NA 137 08/07/2022   K 2.4 (LL) 08/07/2022   CL 106 08/07/2022   CO2 21 (L) 08/07/2022   BUN <5 (L) 08/07/2022   CREATININE 1.00  08/07/2022   GLUCOSE 93 08/07/2022   GFRNONAA >60 08/07/2022   GFRAA >60 12/27/2015   CALCIUM 6.1 (LL) 08/07/2022   PHOS 2.2 (L) 08/07/2022   PROT 4.3 (L) 08/06/2022   ALBUMIN 2.1 (L) 08/06/2022   LABGLOB 2.3 07/16/2022   AGRATIO 0.9 04/14/2022   BILITOT 0.4 08/06/2022   ALKPHOS 66 08/06/2022   AST 25 08/06/2022   ALT 5 08/06/2022   ANIONGAP 10 08/07/2022    CBG (last 3)  Recent Labs    08/06/22 1750 08/06/22 2340 08/07/22 0555  GLUCAP 85 88 78      Coagulation Profile: No results for input(s): "INR", "PROTIME" in the last 168 hours.   Radiology Studies: I have personally reviewed the imaging studies  DG Abd 1 View  Result Date: 08/06/2022 CLINICAL DATA:  Ileus EXAM: ABDOMEN - 1 VIEW COMPARISON:  Radiographs 08/04/2022 FINDINGS: Decreased gaseous distention of the colon compared to 08/04/2022. No definite small bowel dilation. Postoperative changes both femurs. IMPRESSION: Decreased gaseous distention of the colon compared with 08/04/2022. Electronically Signed   By: Minerva Fester M.D.   On: 08/06/2022 20:48       Fredericka Bottcher M.D. [facility] 08/07/2022, 11:05 AM  Available via Epic secure chat 7am-7pm After 7 pm, please refer to night coverage provider listed on amion.

## 2022-08-08 ENCOUNTER — Inpatient Hospital Stay (HOSPITAL_COMMUNITY): Payer: Medicare PPO

## 2022-08-08 DIAGNOSIS — K56609 Unspecified intestinal obstruction, unspecified as to partial versus complete obstruction: Secondary | ICD-10-CM | POA: Diagnosis not present

## 2022-08-08 DIAGNOSIS — R197 Diarrhea, unspecified: Secondary | ICD-10-CM | POA: Diagnosis not present

## 2022-08-08 DIAGNOSIS — R0600 Dyspnea, unspecified: Secondary | ICD-10-CM | POA: Diagnosis not present

## 2022-08-08 DIAGNOSIS — I2699 Other pulmonary embolism without acute cor pulmonale: Secondary | ICD-10-CM | POA: Diagnosis not present

## 2022-08-08 DIAGNOSIS — C9000 Multiple myeloma not having achieved remission: Secondary | ICD-10-CM | POA: Diagnosis not present

## 2022-08-08 DIAGNOSIS — E876 Hypokalemia: Secondary | ICD-10-CM | POA: Diagnosis not present

## 2022-08-08 DIAGNOSIS — I2693 Single subsegmental pulmonary embolism without acute cor pulmonale: Secondary | ICD-10-CM | POA: Diagnosis not present

## 2022-08-08 DIAGNOSIS — K5981 Ogilvie syndrome: Secondary | ICD-10-CM | POA: Diagnosis not present

## 2022-08-08 DIAGNOSIS — D649 Anemia, unspecified: Secondary | ICD-10-CM

## 2022-08-08 LAB — BASIC METABOLIC PANEL
Anion gap: 8 (ref 5–15)
BUN: <5 mg/dL — ABNORMAL LOW (ref 8–23)
CO2: 20 mmol/L — ABNORMAL LOW (ref 22–32)
Calcium: 6.3 mg/dL — CL (ref 8.9–10.3)
Chloride: 110 mmol/L (ref 98–111)
Creatinine, Ser: 0.91 mg/dL (ref 0.61–1.24)
GFR, Estimated: >60 mL/min (ref >60)
Glucose, Bld: 100 mg/dL — ABNORMAL HIGH (ref 70–99)
Potassium: 2.6 mmol/L — CL (ref 3.5–5.1)
Sodium: 138 mmol/L (ref 135–145)

## 2022-08-08 LAB — GLUCOSE, CAPILLARY
Glucose-Capillary: 121 mg/dL — ABNORMAL HIGH (ref 70–99)
Glucose-Capillary: 132 mg/dL — ABNORMAL HIGH (ref 70–99)
Glucose-Capillary: 142 mg/dL — ABNORMAL HIGH (ref 70–99)
Glucose-Capillary: 90 mg/dL (ref 70–99)

## 2022-08-08 LAB — CBC
HCT: 23 % — ABNORMAL LOW (ref 39.0–52.0)
Hemoglobin: 7.7 g/dL — ABNORMAL LOW (ref 13.0–17.0)
MCH: 31.3 pg (ref 26.0–34.0)
MCHC: 33.5 g/dL (ref 30.0–36.0)
MCV: 93.5 fL (ref 80.0–100.0)
Platelets: 183 10*3/uL (ref 150–400)
RBC: 2.46 MIL/uL — ABNORMAL LOW (ref 4.22–5.81)
RDW: 16.3 % — ABNORMAL HIGH (ref 11.5–15.5)
WBC: 4.3 10*3/uL (ref 4.0–10.5)
nRBC: 0 % (ref 0.0–0.2)

## 2022-08-08 LAB — VITAMIN D 25 HYDROXY (VIT D DEFICIENCY, FRACTURES): Vit D, 25-Hydroxy: 34.72 ng/mL (ref 30–100)

## 2022-08-08 LAB — PHOSPHORUS: Phosphorus: 1.9 mg/dL — ABNORMAL LOW (ref 2.5–4.6)

## 2022-08-08 LAB — MAGNESIUM: Magnesium: 1.8 mg/dL (ref 1.7–2.4)

## 2022-08-08 MED ORDER — POTASSIUM CHLORIDE CRYS ER 20 MEQ PO TBCR
40.0000 meq | EXTENDED_RELEASE_TABLET | Freq: Four times a day (QID) | ORAL | Status: DC
  Administered 2022-08-09 – 2022-08-10 (×4): 40 meq via ORAL
  Filled 2022-08-08 (×5): qty 2

## 2022-08-08 MED ORDER — SODIUM CHLORIDE 0.9% FLUSH: 10.0000 mL | INTRAVENOUS | Status: DC | PRN

## 2022-08-08 MED ORDER — ORAL CARE MOUTH RINSE
15.0000 mL | OROMUCOSAL | Status: DC
  Administered 2022-08-08 – 2022-08-16 (×27): 15 mL via OROMUCOSAL

## 2022-08-08 MED ORDER — ENOXAPARIN SODIUM 100 MG/ML IJ SOSY: 1.0000 mg/kg | PREFILLED_SYRINGE | Freq: Two times a day (BID) | INTRAMUSCULAR | Status: DC

## 2022-08-08 MED ORDER — CHLORHEXIDINE GLUCONATE CLOTH 2 % EX PADS
6.0000 | MEDICATED_PAD | Freq: Every day | CUTANEOUS | Status: DC
  Administered 2022-08-09 – 2022-08-16 (×8): 6 via TOPICAL

## 2022-08-08 MED ORDER — POTASSIUM CHLORIDE CRYS ER 20 MEQ PO TBCR
40.0000 meq | EXTENDED_RELEASE_TABLET | ORAL | Status: AC
  Administered 2022-08-08 – 2022-08-09 (×3): 40 meq via ORAL
  Filled 2022-08-08 (×3): qty 2

## 2022-08-08 MED ORDER — LOPERAMIDE HCL 2 MG PO CAPS
2.0000 mg | ORAL_CAPSULE | ORAL | Status: DC | PRN
  Administered 2022-08-10: 2 mg via ORAL
  Filled 2022-08-08: qty 1

## 2022-08-08 MED ORDER — POTASSIUM CHLORIDE 20 MEQ PO PACK
60.0000 meq | PACK | Freq: Once | ORAL | Status: AC
  Administered 2022-08-08: 60 meq via ORAL
  Filled 2022-08-08: qty 3

## 2022-08-08 MED ORDER — ORAL CARE MOUTH RINSE: 15.0000 mL | OROMUCOSAL | Status: DC | PRN

## 2022-08-08 MED ORDER — SODIUM CHLORIDE 0.9% FLUSH
10.0000 mL | Freq: Two times a day (BID) | INTRAVENOUS | Status: DC
  Administered 2022-08-08 – 2022-08-09 (×4): 10 mL
  Administered 2022-08-10: 20 mL
  Administered 2022-08-10: 30 mL
  Administered 2022-08-11: 10 mL
  Administered 2022-08-11: 30 mL
  Administered 2022-08-12 – 2022-08-16 (×7): 10 mL

## 2022-08-08 MED ORDER — CALCIUM CARBONATE ANTACID 500 MG PO CHEW
800.0000 mg | CHEWABLE_TABLET | Freq: Three times a day (TID) | ORAL | Status: DC
  Administered 2022-08-08 – 2022-08-16 (×21): 800 mg via ORAL
  Filled 2022-08-08 (×21): qty 4

## 2022-08-08 MED ORDER — POTASSIUM PHOSPHATES 15 MMOLE/5ML IV SOLN
30.0000 mmol | Freq: Once | INTRAVENOUS | Status: AC
  Administered 2022-08-08: 30 mmol via INTRAVENOUS
  Filled 2022-08-08: qty 10

## 2022-08-08 NOTE — H&P (View-Only) (Signed)
  Progress Note   LOS: 10 days   Chief Complaint: Diarrhea   Subjective   No family was present at the time of my evaluation. Patient denies abdominal pain, nausea, vomiting.  Just recently received PICC line.  24 hours stool output 1185.  Potassium continues to be low (2.6) so we will proceed with flex sigmoidoscopy tomorrow and cancel today's procedure. Lovenox is also higher dose so ideally hold for 12 hrs prior to procedure.   Objective   Vital signs in last 24 hours: Temp:  [97.5 F (36.4 C)-98.4 F (36.9 C)] 98.4 F (36.9 C) (06/09 0400) Pulse Rate:  [58-71] 60 (06/09 0600) Resp:  [17-27] 17 (06/09 0600) BP: (133-164)/(41-74) 133/50 (06/09 0600) SpO2:  [96 %-100 %] 100 % (06/09 0600) Weight:  [101.2 kg] 101.2 kg (06/08 1455) Last BM Date : 08/08/22 Last BM recorded by nurses in past 5 days Stool Type: Type 6 (Mushy consistency with ragged edges); Type 7 (Liquid consistency with no solid pieces) (08/08/2022  6:24 AM)  General:   male in no acute distress Heart:  Regular rate and rhythm; no murmurs Pulm: Clear anteriorly; no wheezing Abdomen: soft, nondistended, normal bowel sounds in all quadrants. Nontender without guarding. No organomegaly appreciated. Extremities:  No edema Neurologic:  Alert and  oriented x4;  No focal deficits.  Psych:  Cooperative. Normal mood and affect.  Intake/Output from previous day: 06/08 0701 - 06/09 0700 In: 2068 [P.O.:200; I.V.:1739.3; IV Piggyback:98.7] Out: 2635 [Urine:1450; Stool:1185] Intake/Output this shift: No intake/output data recorded.  Studies/Results: US EKG SITE RITE  Result Date: 08/07/2022 If Site Rite image not attached, placement could not be confirmed due to current cardiac rhythm.  DG Abd 1 View  Result Date: 08/06/2022 CLINICAL DATA:  Ileus EXAM: ABDOMEN - 1 VIEW COMPARISON:  Radiographs 08/04/2022 FINDINGS: Decreased gaseous distention of the colon compared to 08/04/2022. No definite small bowel dilation.  Postoperative changes both femurs. IMPRESSION: Decreased gaseous distention of the colon compared with 08/04/2022. Electronically Signed   By: Tyler  Stutzman M.D.   On: 08/06/2022 20:48    Lab Results: Recent Labs    08/06/22 0259 08/07/22 0724  WBC 4.3 3.7*  HGB 8.1* 7.5*  HCT 23.6* 21.9*  PLT 200 176   BMET Recent Labs    08/06/22 0259 08/07/22 0724 08/08/22 0300  NA 139 137 138  K 3.0* 2.4* 2.6*  CL 110 106 110  CO2 20* 21* 20*  GLUCOSE 90 93 100*  BUN <5* <5* <5*  CREATININE 0.87 1.00 0.91  CALCIUM 5.9* 6.1* 6.3*   LFT Recent Labs    08/06/22 0259  PROT 4.3*  ALBUMIN 2.1*  AST 25  ALT 5  ALKPHOS 66  BILITOT 0.4  BILIDIR 0.2  IBILI 0.2*   PT/INR No results for input(s): "LABPROT", "INR" in the last 72 hours.   Scheduled Meds:  acyclovir  400 mg Oral QHS   budesonide (PULMICORT) nebulizer solution  0.25 mg Nebulization BID   Chlorhexidine Gluconate Cloth  6 each Topical Daily   cholestyramine light  4 g Oral QID   enoxaparin (LOVENOX) injection  1 mg/kg Subcutaneous Q12H   feeding supplement  1 Container Oral TID BM   leptospermum manuka honey  1 Application Topical Daily   multivitamin with minerals  1 tablet Oral Daily   mouth rinse  15 mL Mouth Rinse 4 times per day   pantoprazole (PROTONIX) IV  40 mg Intravenous Q12H   potassium chloride  40 mEq Oral TID     sodium bicarbonate  1,300 mg Oral TID   Continuous Infusions:  calcium gluconate Stopped (08/07/22 2137)   dextrose 5 % and 0.45 % NaCl with KCl 40 mEq/L 75 mL/hr at 08/08/22 0624   potassium PHOSPHATE IVPB (in mmol)        Patient profile:   82-year-old male with multiple myeloma, currently receiving treatment, admitted with respiratory symptoms, found to have new pulmonary embolism being evaluated for profuse diarrhea and electrolyte imbalances.   Impression:   Diarrhea -Previously diagnosed with E. coli and given 3-day course of azithromycin.  Most recent stool studies show negative  C. difficile and negative E. coli (negative GI pathogen panel) - Previous pseudoobstruction with recent abdominal x-ray showing resolution. -1185 stool output -Phosphorus 1.9 -Magnesium 1.8 -Potassium 2.6 Patient continues to struggle with electrolyte imbalance and is on higher dose of Lovenox.  Would ideally like to hold 12 hours prior to procedure.  With electrolyte imbalance and higher dose of Lovenox recommend canceling Fleck sigmoidoscopy today and rescheduling until tomorrow    Plan:   -Reschedule flex sig until tomorrow -Continue to monitor and correct electrolyte imbalances -Hold PM Lovenox and tomorrow AM dose as well prior to procedure -Continue to monitor -Continue cholestyramine  Bayley M McMichael  08/08/2022, 8:23 AM     Attending physician's note   I have taken history, reviewed the chart and examined the patient. I performed a substantive portion of this encounter, including complete performance of at least one of the key components, in conjunction with the APP. I agree with the Advanced Practitioner's note, impression and recommendations.   Continued diarrhea with electrolyte abn on Flexi-Seal- neg stool studies.  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  -Off all A/Bs. Had EPEC (treated with 3-day course of azithromycin).  Had cellulitis previously.  Ogilvie's syndrome (resolved) PE on Lovenox Multiple myeloma Large asymptomatic right inguinal hernia  Plan: -FS with Bx tomorrow with Dr Cirigliano or Dr. Gessner after holding Lovenox.  Explained procedure.  Risks and benefits -Continue cholestyramine 4g QID -Add low-dose Imodium.  Watch for colonic distention. -Replace electrolytes. Keep K>4, Mg>2   Dr. Cirigliano/Dr. Gessner taking over the service tomorrow  Raj Ailani Governale, MD [facility] [phone]  

## 2022-08-08 NOTE — Consult Note (Signed)
Renal Service Consult Note [facility]  Frank Hernandez 08/08/2022 [name], MD Requesting Physician: Dr. Isidoro Donning  Reason for Consult: Hypokalemia HPI: The patient is a 82 y.o. year-old w/ PMH as below who presented to ED for wheezing and rhonchi. Also was taking abx for cellulitis and having diarrhea, also worsening foot wounds R > L, also poor appetite and abdominal distension. In ED VSS, 100% on RA, pt rec'd IV decadron, fentanyl 25 mcg , MgSO4 IV, IV zosyn and duoneb and IV heparin infusion for PE. CTA chest showed LLL PE, minimal clot burden. CT abd showed quite dilated colon w/ AFL's. New L inguinal hernia containing sigmoid colon, separate R inguinal hernia involving small bowel and significant portion of the urinary bladder. Lytic bony lesions were seen c/w known hx of multiple myeloma. Pt was admitted 07/29/22 for suspected bowel obstruction w/ acute PE LLL, bilat LE cellulitis, hypoMg, hypoK and hypocalcemia. Hx of recently dx'd multiple myeloma, sp chemoRx but did not tolerate well so hasn't had in about 1 month. After admission pt has rec'd multiple dosing of po KCl and IV KCL, multiple rounds of IV calcium gluconate but low K+ levels are persisting. We are asked to see for hypokalemia.   Pt seen in room, wife at bedside. Per family the pt broke his hip getting out of a car in Jan 2024. They found multiple myeloma may have caused this fracture. He went on chemoRx but didn't tolerate it and hasn't had any chemoRx in about 1 month.     ROS - denies CP, no joint pain, no HA, no blurry vision, no rash, no diarrhea, no nausea/ vomiting, no dysuria, no difficulty voiding   Past Medical History  Past Medical History:  Diagnosis Date   Bleeding from the nose    Hypertension    Pre-diabetes    Past Surgical History  Past Surgical History:  Procedure Laterality Date   COLONOSCOPY     INTRAMEDULLARY (IM) NAIL INTERTROCHANTERIC Left 06/11/2020   Procedure:  INTRAMEDULLARY (IM) NAIL INTERTROCHANTRIC;  Surgeon: Roby Lofts, MD;  Location: [facility] OR;  Service: Orthopedics;  Laterality: Left;   INTRAMEDULLARY (IM) NAIL INTERTROCHANTERIC Right 04/17/2022   Procedure: INTRAMEDULLARY (IM) NAIL INTERTROCHANTERIC;  Surgeon: London Sheer, MD;  Location: [facility] ORS;  Service: Orthopedics;  Laterality: Right;   KNEE ARTHROSCOPY Bilateral    POLYPECTOMY     Family History  Family History  Problem Relation Age of Onset   Colon cancer Neg Hx    Social History  reports that he has never smoked. He has never used smokeless tobacco. He reports that he does not drink alcohol and does not use drugs. Allergies No Known Allergies Home medications Prior to Admission medications   Medication Sig Start Date End Date Taking? Authorizing Provider  acetaminophen (TYLENOL) 500 MG tablet Take 1,000 mg by mouth daily as needed for moderate pain.   Yes [provider]  acyclovir (ZOVIRAX) 400 MG tablet Take 1 tablet (400 mg total) by mouth daily. 06/25/22  Yes Johney Maine, MD  aspirin EC 81 MG tablet Take 1 tablet (81 mg total) by mouth daily. Swallow whole. 06/02/22  Yes Rodolph Bong, MD  feeding supplement (ENSURE ENLIVE / ENSURE PLUS) LIQD Take 237 mLs by mouth 2 (two) times daily between meals. Patient taking differently: Take 237 mLs by mouth daily as needed (nurition). 04/03/22  Yes Almon Hercules, MD  ferrous sulfate 325 (65 FE) MG tablet Take 1 tablet (325  mg total) by mouth 2 (two) times daily with a meal. Patient taking differently: Take 325 mg by mouth 3 (three) times a week. 06/01/22  Yes Rodolph Bong, MD  loperamide (IMODIUM) 2 MG capsule Take 1 capsule (2 mg total) by mouth as needed for diarrhea or loose stools. Patient taking differently: Take 2 mg by mouth in the morning and at bedtime. 06/01/22  Yes Rodolph Bong, MD  potassium chloride SA (KLOR-CON M) 20 MEQ tablet Take 2 tablets (40 mEq total) by mouth 2 (two) times daily. Patient  taking differently: Take 20 mEq by mouth 3 (three) times daily. 07/09/22  Yes Johney Maine, MD  Vitamin D, Ergocalciferol, (DRISDOL) 1.25 MG (50000 UNIT) CAPS capsule Take 1 capsule (50,000 Units total) by mouth once a week. 07/09/22  Yes Johney Maine, MD  doxazosin (CARDURA) 2 MG tablet Take 2 mg by mouth daily. Patient not taking: Reported on 07/29/2022 06/22/22   [provider]  mirtazapine (REMERON) 15 MG tablet Take 1 tablet (15 mg total) by mouth at bedtime. Patient not taking: Reported on 07/29/2022 07/09/22   Johney Maine, MD  ondansetron (ZOFRAN) 8 MG tablet Take 8 mg by mouth 30 to 60 min prior to Cyclophosphamide administration then take 8 mg every 8 hrs as needed for nausea and vomiting. Patient not taking: Reported on 07/29/2022 05/24/22   Johney Maine, MD  prochlorperazine (COMPAZINE) 10 MG tablet Take 1 tablet (10 mg total) by mouth every 6 (six) hours as needed for nausea or vomiting. Patient not taking: Reported on 07/29/2022 05/24/22   Johney Maine, MD     Vitals:   08/08/22 0600 08/08/22 0800 08/08/22 1000 08/08/22 1200  BP: (!) 133/50 138/63 (!) 147/55 (!) 144/57  Pulse: 60 69 74 (!) 56  Resp: 17 (!) 22 (!) 23 19  Temp:    98.9 F (37.2 C)  TempSrc:    Oral  SpO2: 100% 100% 100% 100%  Weight:      Height:       Exam Gen alert, no distress, obese AAM, on RA No rash, cyanosis or gangrene Sclera anicteric, throat clear  No jvd or bruits Chest clear bilat to bases, no rales/ wheezing RRR no RG Abd obese soft ntnd no mass or ascites +bs GU normal male MS no joint effusions or deformity Ext 2+ bilat LE edema pretib, 1+ bilat UE edema Neuro is alert, Ox 3 , nf       Home meds include - zovirax, asa, ensure plus, klorcon, imodium, vit D2, cardura 2 mg qd, remeron, zofran prn, compazine prn, prns/ vits/ supps    CXR 6/09 - no active disease   I/O total = 23 L in and 22 L out = + 1 L   Ca++ 5.7- 6.3 , corrected Ca = 7.3- 7.9  (8.9- 10.3)    Alb 3.0 >> 2.1   CO2 9 --> 21     Creat 1.83 --> 1.11 on 6/02, creat has been normal since then 0.87- 1.10      Vit D and 1,25 vit D  pend       PTH pend   Assessment/ Plan: Hypokalemia - 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 GI is following. Would recommend increasing daily po dosing to 40 meq every 4-6 hrs until K+ is > 4.0 then continue 30-39meq KCl per day most likely, until the primary issue is better.  Hypocalcemia -  hx vit D deficiency yrs ago. Will get vit D, calcitriol and pth levels. Cont prn IV Ca gluc. Will start po CaCO3 at 800mg  elemental tid for now.  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  Diarrhea - ongoing for several months, Cdif negative. Was rx'd for +Ecoli on GI panel w/ po abx for 3 days.  NAG metabolic acidosis - getting IV sod bicarb, inmproving, cont for now Hypomagnesemia - cont to replace IV / PO as needed Hypophosphatemia - cont to replace IV/ PO  as needed Multiple myeloma - dx'd earlier this year, off chemo for now as not tolerating.       Vinson Moselle  MD [facility] 08/08/2022, 2:15 PM  Recent Labs  Lab 08/06/22 0259 08/07/22 0724 08/08/22 0300 08/08/22 1242  HGB 8.1* 7.5*  --  7.7*  ALBUMIN 2.1*  --   --   --   CALCIUM 5.9* 6.1* 6.3*  --   PHOS 2.3* 2.2* 1.9*  --   CREATININE 0.87 1.00 0.91  --   K 3.0* 2.4* 2.6*  --    Inpatient medications:  acyclovir  400 mg Oral QHS   budesonide (PULMICORT) nebulizer solution  0.25 mg Nebulization BID   Chlorhexidine Gluconate Cloth  6 each Topical Daily   cholestyramine light  4 g Oral QID   [START ON 08/09/2022] enoxaparin (LOVENOX) injection  1 mg/kg Subcutaneous Q12H   feeding supplement  1 Container Oral TID BM   leptospermum manuka honey  1 Application Topical Daily   multivitamin with minerals  1  tablet Oral Daily   mouth rinse  15 mL Mouth Rinse 4 times per day   pantoprazole (PROTONIX) IV  40 mg Intravenous Q12H   potassium chloride  40 mEq Oral TID   sodium bicarbonate  1,300 mg Oral TID   sodium chloride flush  10-40 mL Intracatheter Q12H    calcium gluconate Stopped (08/08/22 1131)   dextrose 5 % and 0.45 % NaCl with KCl 40 mEq/L 75 mL/hr at 08/08/22 1148   potassium PHOSPHATE IVPB (in mmol) 30 mmol (08/08/22 1149)   acetaminophen **OR** acetaminophen, HYDROmorphone (DILAUDID) injection, loperamide, metoprolol tartrate, mouth rinse, pneumococcal 20-valent conjugate vaccine, prochlorperazine, sodium chloride flush, zinc oxide

## 2022-08-08 NOTE — Anesthesia Preprocedure Evaluation (Addendum)
Anesthesia Evaluation  Patient identified by MRN, date of birth, ID band Patient awake    Reviewed: Allergy & Precautions, NPO status , Patient's Chart, lab work & pertinent test results  History of Anesthesia Complications Negative for: history of anesthetic complications  Airway Mallampati: I  TM Distance: >3 FB Neck ROM: Full    Dental no notable dental hx. (+) Dental Advisory Given, Edentulous Upper, Edentulous Lower   Pulmonary PE   Pulmonary exam normal breath sounds clear to auscultation       Cardiovascular hypertension, + DVT  Normal cardiovascular exam Rhythm:Regular Rate:Normal     Neuro/Psych negative neurological ROS  negative psych ROS   GI/Hepatic negative GI ROS, Neg liver ROS,,,  Endo/Other  Hypokalemia   Renal/GU negative Renal ROS     Musculoskeletal  (+) Arthritis ,    Abdominal  (+) + obese  Peds  Hematology  (+) Blood dyscrasia (Hgb 7.5), anemia   Anesthesia Other Findings diarrhea  Reproductive/Obstetrics                             Anesthesia Physical Anesthesia Plan  ASA: 3  Anesthesia Plan: MAC   Post-op Pain Management: Minimal or no pain anticipated   Induction:   PONV Risk Score and Plan: Treatment may vary due to age or medical condition and Propofol infusion  Airway Management Planned: Natural Airway and Simple Face Mask  Additional Equipment: None  Intra-op Plan:   Post-operative Plan:   Informed Consent: I have reviewed the patients History and Physical, chart, labs and discussed the procedure including the risks, benefits and alternatives for the proposed anesthesia with the patient or authorized representative who has indicated his/her understanding and acceptance.       Plan Discussed with:   Anesthesia Plan Comments:        Anesthesia Quick Evaluation

## 2022-08-08 NOTE — Progress Notes (Addendum)
Progress Note   LOS: 10 days   Chief Complaint: Diarrhea   Subjective   No family was present at the time of my evaluation. Patient denies abdominal pain, nausea, vomiting.  Just recently received PICC line.  24 hours stool output 1185.  Potassium continues to be low (2.6) so we will proceed with flex sigmoidoscopy tomorrow and cancel today's procedure. Lovenox is also higher dose so ideally hold for 12 hrs prior to procedure.   Objective   Vital signs in last 24 hours: Temp:  [97.5 F (36.4 C)-98.4 F (36.9 C)] 98.4 F (36.9 C) (06/09 0400) Pulse Rate:  [58-71] 60 (06/09 0600) Resp:  [17-27] 17 (06/09 0600) BP: (133-164)/(41-74) 133/50 (06/09 0600) SpO2:  [96 %-100 %] 100 % (06/09 0600) Weight:  [101.2 kg] 101.2 kg (06/08 1455) Last BM Date : 08/08/22 Last BM recorded by nurses in past 5 days Stool Type: Type 6 (Mushy consistency with ragged edges); Type 7 (Liquid consistency with no solid pieces) (08/08/2022  6:24 AM)  General:   male in no acute distress Heart:  Regular rate and rhythm; no murmurs Pulm: Clear anteriorly; no wheezing Abdomen: soft, nondistended, normal bowel sounds in all quadrants. Nontender without guarding. No organomegaly appreciated. Extremities:  No edema Neurologic:  Alert and  oriented x4;  No focal deficits.  Psych:  Cooperative. Normal mood and affect.  Intake/Output from previous day: 06/08 0701 - 06/09 0700 In: 2068 [P.O.:200; I.V.:1739.3; IV Piggyback:98.7] Out: 2635 [Urine:1450; Stool:1185] Intake/Output this shift: No intake/output data recorded.  Studies/Results: [place] EKG SITE RITE  Result Date: 08/07/2022 If Site Rite image not attached, placement could not be confirmed due to current cardiac rhythm.  DG Abd 1 View  Result Date: 08/06/2022 CLINICAL DATA:  Ileus EXAM: ABDOMEN - 1 VIEW COMPARISON:  Radiographs 08/04/2022 FINDINGS: Decreased gaseous distention of the colon compared to 08/04/2022. No definite small bowel dilation.  Postoperative changes both femurs. IMPRESSION: Decreased gaseous distention of the colon compared with 08/04/2022. Electronically Signed   By: Minerva Fester M.D.   On: 08/06/2022 20:48    Lab Results: Recent Labs    08/06/22 0259 08/07/22 0724  WBC 4.3 3.7*  HGB 8.1* 7.5*  HCT 23.6* 21.9*  PLT 200 176   BMET Recent Labs    08/06/22 0259 08/07/22 0724 08/08/22 0300  NA 139 137 138  K 3.0* 2.4* 2.6*  CL 110 106 110  CO2 20* 21* 20*  GLUCOSE 90 93 100*  BUN <5* <5* <5*  CREATININE 0.87 1.00 0.91  CALCIUM 5.9* 6.1* 6.3*   LFT Recent Labs    08/06/22 0259  PROT 4.3*  ALBUMIN 2.1*  AST 25  ALT 5  ALKPHOS 66  BILITOT 0.4  BILIDIR 0.2  IBILI 0.2*   PT/INR No results for input(s): "LABPROT", "INR" in the last 72 hours.   Scheduled Meds:  acyclovir  400 mg Oral QHS   budesonide (PULMICORT) nebulizer solution  0.25 mg Nebulization BID   Chlorhexidine Gluconate Cloth  6 each Topical Daily   cholestyramine light  4 g Oral QID   enoxaparin (LOVENOX) injection  1 mg/kg Subcutaneous Q12H   feeding supplement  1 Container Oral TID BM   leptospermum manuka honey  1 Application Topical Daily   multivitamin with minerals  1 tablet Oral Daily   mouth rinse  15 mL Mouth Rinse 4 times per day   pantoprazole (PROTONIX) IV  40 mg Intravenous Q12H   potassium chloride  40 mEq Oral TID  sodium bicarbonate  1,300 mg Oral TID   Continuous Infusions:  calcium gluconate Stopped (08/07/22 2137)   dextrose 5 % and 0.45 % NaCl with KCl 40 mEq/L 75 mL/hr at 08/08/22 0981   potassium PHOSPHATE IVPB (in mmol)        Patient profile:   82 year old male with multiple myeloma, currently receiving treatment, admitted with respiratory symptoms, found to have new pulmonary embolism being evaluated for profuse diarrhea and electrolyte imbalances.   Impression:   Diarrhea -Previously diagnosed with E. coli and given 3-day course of azithromycin.  Most recent stool studies show negative  C. difficile and negative E. coli (negative GI pathogen panel) - Previous pseudoobstruction with recent abdominal x-ray showing resolution. -1185 stool output -Phosphorus 1.9 -Magnesium 1.8 -Potassium 2.6 Patient continues to struggle with electrolyte imbalance and is on higher dose of Lovenox.  Would ideally like to hold 12 hours prior to procedure.  With electrolyte imbalance and higher dose of Lovenox recommend canceling Fleck sigmoidoscopy today and rescheduling until tomorrow    Plan:   -Reschedule flex sig until tomorrow -Continue to monitor and correct electrolyte imbalances -Hold PM Lovenox and tomorrow AM dose as well prior to procedure -Continue to monitor -Continue cholestyramine  Bayley Leanna Sato  08/08/2022, 8:23 AM     Attending physician's note   I have taken history, reviewed the chart and examined the patient. I performed a substantive portion of this encounter, including complete performance of at least one of the key components, in conjunction with the APP. I agree with the Advanced Practitioner's note, impression and recommendations.   Continued diarrhea with electrolyte abn on Flexi-Seal- neg stool studies.  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  -Off all A/Bs. Had EPEC (treated with 3-day course of azithromycin).  Had cellulitis previously.  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 Leone Payor after holding Lovenox.  Explained procedure.  Risks and benefits -Continue cholestyramine 4g QID -Add low-dose Imodium.  Watch for colonic distention. -Replace electrolytes. Keep K>4, Mg>2   Dr. Jethro Bastos. Leone Payor taking over the service tomorrow  Edman Circle, MD [facility] [phone]

## 2022-08-08 NOTE — Progress Notes (Signed)
Peripherally Inserted Central Catheter Placement  The IV Nurse has discussed with the patient and/or persons authorized to consent for the patient, the purpose of this procedure and the potential benefits and risks involved with this procedure.  The benefits include less needle sticks, lab draws from the catheter, and the patient may be discharged home with the catheter. Risks include, but not limited to, infection, bleeding, blood clot (thrombus formation), and puncture of an artery; nerve damage and irregular heartbeat and possibility to perform a PICC exchange if needed/ordered by physician.  Alternatives to this procedure were also discussed.  Bard Power PICC patient education guide, fact sheet on infection prevention and patient information card has been provided to patient /or left at bedside.    PICC Placement Documentation  PICC Double Lumen 08/08/22 Right Brachial 40 cm 0 cm (Active)  Indication for Insertion or Continuance of Line Limited venous access - need for IV therapy >5 days (PICC only) 08/08/22 0810  Exposed Catheter (cm) 0 cm 08/08/22 0810  Site Assessment Clean, Dry, Intact 08/08/22 0810  Lumen #1 Status Saline locked;Flushed;Blood return noted 08/08/22 0810  Lumen #2 Status Flushed;Saline locked;Blood return noted 08/08/22 0810  Dressing Type Transparent;Securing device 08/08/22 0810  Dressing Status Antimicrobial disc in place;Clean, Dry, Intact 08/08/22 0810  Safety Lock Not Applicable 08/08/22 0810  Line Care Connections checked and tightened 08/08/22 0810  Line Adjustment (NICU/IV Team Only) No 08/08/22 0810  Dressing Intervention New dressing 08/08/22 0810  Dressing Change Due 08/15/22 08/08/22 0810       Burnard Bunting Chenice 08/08/2022, 8:11 AM

## 2022-08-08 NOTE — Progress Notes (Signed)
[facility]                                                                              Frank Hernandez, is a 82 y.o. male, DOB - [birth year], ZOX:[id number] Admit date - 07/29/2022    Outpatient Primary MD for the patient is Frank Hernandez (Inactive)  LOS - 10  days  No chief complaint on file.      Brief summary   Patient is a 82 year old with hypertension, prediabetes, stage IIIa CKD, pathological fracture of the right femoral, multiple myeloma presented to ED with wheezing and rhonchi. Patient has been taking antibiotics for cellulitis.  He has had worsening of his right foot wound.  He reported abdominal distention with watery stool.   Chest x-ray showed no active cardiopulmonary disease.  CTA chest showed left lower lobe segmental PE with minimal clot burden.  CT abdomen with dilated colon with air-fluid level seen and scattered colonic diverticula.  Transition seen in the area of the new left inguinal hernia involving the sigmoid colon.  No pneumatosis.  Small bowel and stomach are known dilated but there is an enteric tube.  Separate large known right inguinal hernia involving the small bowel and significant portion of the urinary bladder.  Lytic bone lesion consistent with known history of myeloma.   Patient was admitted with PE and a small bowel obstruction.  Surgery and GI consulted. 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 Diarrhea thought to be related to daratumnunab.    Assessment & Plan    Principal Problem: Acute left lower lobe segmental pulmonary embolism,  acute DVT left femoral vein -CTA chest showed left lower lobe segmental PE, minimal clot burden -Venous Doppler consistent with acute DVT involving left common femoral vein, indeterminate age DVT. -Continue Lovenox, hold today for flex sig tomorrow -2D echo showed normal RV function  SBO; Pseudo-obstruction Ogilvie Syndrome, colonic  distention: -Presents with obstructive symptoms, CT abdomen pelvis showed Dilated colon with air-fluid level seen and scattered colonic diverticula. Transition seen in the area of the new left inguinal hernia involving the sigmoid colon. No pneumatosis, free air or fluid. Small bowel and stomach are nondilated -NG tube  has been removed by General Surgery.  -Per GI: Ogilvie's/Pseudo-obstruction.    Diarrhea; - C diff negative. Having issues with Diarrhea since last hospitalization.  -GI pathogen  positive for enteropathogenic E coli, completed azithromycin for 3 days.  -Thought to be related to Chemo: Daratumumab however seen by oncology today, unlikely due to daratumumab, last dose was 2 weeks ago and not known to cause much diarrhea.   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  GI adding low-dose Imodium -Plan for flex sig tomorrow with biopsy.  Holding Lovenox    NAG Metabolic Acidosis: in setting of GI losses, profound electrolyte abnormalities -Bicarb improved, changed IV fluids to D5 with potassium replacement to keep up with the losses and profound electrolyte imbalances -Nephrology consulted  Hypomagnesemia, hypokalemia -Despite potassium replacement increased to TID and  with maintenance fluids, K still low 2.6, received extra K-Dur 60 mg p.o. x 1 -Phosphorus 1.9, added K-Phos 30 mmol x 1 -Magnesium 1.8, replaced on 6/8 -Nephrology consulted for refractory hypokalemia  Hypophosphatemia -K-Phos x1 IV   Hypocalcemia -Calcium 6.3, corrected calcium today 7.8 with albumin of 2.1 -Currently on twice daily replacement   Cellulitis of both lower extremity: Completed 5 days Ancef.    Prolonged QT   Replete electrolytes. QT 450    Hypertension:  -BP currently stable   Pre-Diabetes: On diet controlled.     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 -Continue as needed Tylenol   Multiple myeloma -Oncology following   Pressure Injury Documentation: Pressure Injury 03/29/22 Buttocks Right Stage 1 -  Intact skin with non-blanchable redness of a localized area usually over a bony prominence. (Active)  03/29/22 2140  Location: Buttocks  Location Orientation: Right  Staging: Stage 1 -  Intact skin with non-blanchable redness of a localized area usually over a bony prominence.  Wound Description (Comments):   Present on Admission: Yes     Pressure Injury 05/28/22 Heel Right Stage 3 -  Full thickness tissue loss. Subcutaneous fat may be visible but bone, tendon or muscle are NOT exposed. Open wound, broken from pressure & dry skin (Active)  05/28/22 1400  Location: Heel  Location Orientation: Right  Staging: Stage 3 -  Full thickness tissue loss. Subcutaneous fat may be visible but bone, tendon or muscle are NOT exposed.  Wound Description (Comments): Open wound, broken from pressure & dry skin  Present on Admission: Yes  Dressing Type Foam - Lift dressing to assess site every shift 08/08/22 0800   Mild to moderate protein calorie malnutrition, hypoalbuminemia Nutrition Problem: Inadequate oral intake Etiology: acute illness (small bowel obstruction) Signs/Symptoms: energy intake < 75% for > 7 days Interventions: Refer to RD note for recommendations, Boost Breeze, MVI Estimated body mass index is 32.95 kg/m as calculated from the following:   Height as of this encounter: 5\' 9"  (1.753 m).   Weight as of this encounter: 101.2 kg.  Code Status: Full code DVT Prophylaxis:   Lovenox   Level of Care: Level of care: Stepdown Family Communication: Updated patient Disposition Plan:      Remains inpatient appropriate:      Procedures:  2D echo  Consultants:   General surgery GI Nephrology  Antimicrobials:   Anti-infectives (From admission, onward)    Start     Dose/Rate Route Frequency Ordered Stop   08/01/22 1400  ceFAZolin (ANCEF) IVPB 2g/100 mL premix        2 g 200 mL/hr over 30 Minutes Intravenous Every 8 hours 08/01/22 0824 08/03/22 0700   07/31/22 1600  azithromycin (ZITHROMAX) tablet 500 mg        500 mg Oral Daily 07/31/22 1446 08/02/22 1013   07/31/22 1000  vancomycin (VANCOREADY) IVPB 1500 mg/300 mL  Status:  Discontinued        1,500 mg 150 mL/hr over 120 Minutes Intravenous Every 48 hours 07/29/22 1243 07/30/22 0755   07/30/22 2200  acyclovir (ZOVIRAX) tablet 400 mg        400 mg Oral Daily at bedtime 07/30/22 1843  07/30/22 1400  ceFAZolin (ANCEF) IVPB 2g/100 mL premix  Status:  Discontinued        2 g 200 mL/hr over 30 Minutes Intravenous Every 8 hours 07/30/22 0843 08/01/22 0824   07/30/22 0900  doxycycline (VIBRAMYCIN) 100 mg in sodium chloride 0.9 % 250 mL IVPB  Status:  Discontinued        100 mg 125 mL/hr over 120 Minutes Intravenous Every 12 hours 07/30/22 0809 07/31/22 1445   07/29/22 1800  ceFEPIme (MAXIPIME) 2 g in sodium chloride 0.9 % 100 mL IVPB  Status:  Discontinued        2 g 200 mL/hr over 30 Minutes Intravenous Every 12 hours 07/29/22 1234 07/30/22 0841   07/29/22 1230  metroNIDAZOLE (FLAGYL) IVPB 500 mg  Status:  Discontinued        500 mg 100 mL/hr over 60 Minutes Intravenous Every 12 hours 07/29/22 1205 07/30/22 0909   07/29/22 0930  piperacillin-tazobactam (ZOSYN) IVPB 3.375 g        3.375 g 100 mL/hr over 30 Minutes Intravenous  Once 07/29/22 0920 07/29/22 1038   07/29/22 0930  vancomycin (VANCOREADY) IVPB 2000 mg/400 mL        2,000 mg 200 mL/hr over 120 Minutes Intravenous  Once 07/29/22 1610 07/29/22  1246          Medications  acyclovir  400 mg Oral QHS   budesonide (PULMICORT) nebulizer solution  0.25 mg Nebulization BID   Chlorhexidine Gluconate Cloth  6 each Topical Daily   cholestyramine light  4 g Oral QID   [START ON 08/09/2022] enoxaparin (LOVENOX) injection  1 mg/kg Subcutaneous Q12H   feeding supplement  1 Container Oral TID BM   leptospermum manuka honey  1 Application Topical Daily   multivitamin with minerals  1 tablet Oral Daily   mouth rinse  15 mL Mouth Rinse 4 times per day   pantoprazole (PROTONIX) IV  40 mg Intravenous Q12H   potassium chloride  40 mEq Oral TID   sodium bicarbonate  1,300 mg Oral TID   sodium chloride flush  10-40 mL Intracatheter Q12H      Subjective:   Frank Hernandez was seen and examined today.  Continues to have diarrhea, severe electrolyte abnormalities.  PICC line being placed today.  No fevers or chills, no nausea vomiting or abdominal pain.   Objective:   Vitals:   08/08/22 0400 08/08/22 0600 08/08/22 0800 08/08/22 1000  BP: (!) 140/41 (!) 133/50 138/63 (!) 147/55  Pulse: 66 60 69 74  Resp: 20 17 (!) 22 (!) 23  Temp: 98.4 F (36.9 C)     TempSrc:      SpO2: 100% 100% 100% 100%  Weight:      Height:        Intake/Output Summary (Last 24 hours) at 08/08/2022 1200 Last data filed at 08/08/2022 1148 Gross per 24 hour  Intake 2201.57 ml  Output 2335 ml  Net -133.43 ml     Wt Readings from Last 3 Encounters:  08/07/22 101.2 kg  07/16/22 90.5 kg  07/09/22 91.9 kg   Physical Exam General: Alert and oriented x 3, NAD Cardiovascular: S1 S2 clear, RRR.  Respiratory: CTAB Gastrointestinal: Soft, nontender, nondistended, NBS Ext: no pedal edema bilaterally Neuro: no new deficits Psych: Normal affect    Data Reviewed:  I have personally reviewed following labs    CBC Lab Results  Component Value Date   WBC 3.7 (L) 08/07/2022   RBC 2.35 (L) 08/07/2022  HGB 7.5 (L) 08/07/2022   HCT 21.9 (L) 08/07/2022   MCV  93.2 08/07/2022   MCH 31.9 08/07/2022   PLT 176 08/07/2022   MCHC 34.2 08/07/2022   RDW 16.6 (H) 08/07/2022   LYMPHSABS 2.0 07/29/2022   MONOABS 1.0 07/29/2022   EOSABS 0.2 07/29/2022   BASOSABS 0.0 07/29/2022     Last metabolic panel Lab Results  Component Value Date   NA 138 08/08/2022   K 2.6 (LL) 08/08/2022   CL 110 08/08/2022   CO2 20 (L) 08/08/2022   BUN <5 (L) 08/08/2022   CREATININE 0.91 08/08/2022   GLUCOSE 100 (H) 08/08/2022   GFRNONAA >60 08/08/2022   GFRAA >60 12/27/2015   CALCIUM 6.3 (LL) 08/08/2022   PHOS 1.9 (L) 08/08/2022   PROT 4.3 (L) 08/06/2022   ALBUMIN 2.1 (L) 08/06/2022   LABGLOB 2.3 07/16/2022   AGRATIO 0.9 04/14/2022   BILITOT 0.4 08/06/2022   ALKPHOS 66 08/06/2022   AST 25 08/06/2022   ALT 5 08/06/2022   ANIONGAP 8 08/08/2022    CBG (last 3)  Recent Labs    08/07/22 1710 08/07/22 2327 08/08/22 0629  GLUCAP 101* 121* 90      Coagulation Profile: No results for input(s): "INR", "PROTIME" in the last 168 hours.   Radiology Studies: I have personally reviewed the imaging studies  DG CHEST PORT 1 VIEW  Result Date: 08/08/2022 CLINICAL DATA:  PICC line placement. EXAM: PORTABLE CHEST 1 VIEW COMPARISON:  07/30/2022 FINDINGS: Lungs are clear. Cardiopericardial silhouette is at upper limits of normal for size. Right PICC line is new in the interval with tip overlying the mid SVC level. Telemetry leads overlie the chest. IMPRESSION: Right PICC line tip overlies the mid SVC level. Electronically Signed   By: Kennith Center M.D.   On: 08/08/2022 09:43   [place] EKG SITE RITE  Result Date: 08/07/2022 If Site Rite image not attached, placement could not be confirmed due to current cardiac rhythm.  DG Abd 1 View  Result Date: 08/06/2022 CLINICAL DATA:  Ileus EXAM: ABDOMEN - 1 VIEW COMPARISON:  Radiographs 08/04/2022 FINDINGS: Decreased gaseous distention of the colon compared to 08/04/2022. No definite small bowel dilation. Postoperative changes both  femurs. IMPRESSION: Decreased gaseous distention of the colon compared with 08/04/2022. Electronically Signed   By: Minerva Fester M.D.   On: 08/06/2022 20:48       Frank Hernandez M.D. [facility] 08/08/2022, 12:00 PM  Available via Epic secure chat 7am-7pm After 7 pm, please refer to night coverage provider listed on amion.

## 2022-08-09 ENCOUNTER — Encounter (HOSPITAL_COMMUNITY): Payer: Self-pay | Admitting: Internal Medicine

## 2022-08-09 ENCOUNTER — Inpatient Hospital Stay (HOSPITAL_COMMUNITY): Payer: Medicare PPO

## 2022-08-09 ENCOUNTER — Inpatient Hospital Stay (HOSPITAL_COMMUNITY): Payer: Medicare PPO | Admitting: Anesthesiology

## 2022-08-09 ENCOUNTER — Encounter (HOSPITAL_COMMUNITY)
Admission: EM | Disposition: A | Discharge status: Skilled Nursing Facility | Payer: Self-pay | Source: Home / Self Care | Attending: Internal Medicine

## 2022-08-09 DIAGNOSIS — I10 Essential (primary) hypertension: Secondary | ICD-10-CM

## 2022-08-09 DIAGNOSIS — K5730 Diverticulosis of large intestine without perforation or abscess without bleeding: Secondary | ICD-10-CM

## 2022-08-09 DIAGNOSIS — K6389 Other specified diseases of intestine: Secondary | ICD-10-CM

## 2022-08-09 DIAGNOSIS — I82409 Acute embolism and thrombosis of unspecified deep veins of unspecified lower extremity: Secondary | ICD-10-CM

## 2022-08-09 DIAGNOSIS — K567 Ileus, unspecified: Secondary | ICD-10-CM

## 2022-08-09 DIAGNOSIS — K56609 Unspecified intestinal obstruction, unspecified as to partial versus complete obstruction: Secondary | ICD-10-CM | POA: Diagnosis not present

## 2022-08-09 DIAGNOSIS — R0600 Dyspnea, unspecified: Secondary | ICD-10-CM | POA: Diagnosis not present

## 2022-08-09 DIAGNOSIS — I2693 Single subsegmental pulmonary embolism without acute cor pulmonale: Secondary | ICD-10-CM | POA: Diagnosis not present

## 2022-08-09 HISTORY — PX: FLEXIBLE SIGMOIDOSCOPY: SHX5431

## 2022-08-09 HISTORY — PX: BIOPSY: SHX5522

## 2022-08-09 LAB — MAGNESIUM: Magnesium: 1.6 mg/dL — ABNORMAL LOW (ref 1.7–2.4)

## 2022-08-09 LAB — CBC
HCT: 22.4 % — ABNORMAL LOW (ref 39.0–52.0)
Hemoglobin: 7.5 g/dL — ABNORMAL LOW (ref 13.0–17.0)
MCH: 31.4 pg (ref 26.0–34.0)
MCHC: 33.5 g/dL (ref 30.0–36.0)
MCV: 93.7 fL (ref 80.0–100.0)
Platelets: 162 10*3/uL (ref 150–400)
RBC: 2.39 MIL/uL — ABNORMAL LOW (ref 4.22–5.81)
RDW: 16.2 % — ABNORMAL HIGH (ref 11.5–15.5)
WBC: 4.1 10*3/uL (ref 4.0–10.5)
nRBC: 0 % (ref 0.0–0.2)

## 2022-08-09 LAB — BASIC METABOLIC PANEL
Anion gap: 8 (ref 5–15)
BUN: <5 mg/dL — ABNORMAL LOW (ref 8–23)
CO2: 20 mmol/L — ABNORMAL LOW (ref 22–32)
Calcium: 6.5 mg/dL — ABNORMAL LOW (ref 8.9–10.3)
Chloride: 112 mmol/L — ABNORMAL HIGH (ref 98–111)
Creatinine, Ser: 0.9 mg/dL (ref 0.61–1.24)
GFR, Estimated: >60 mL/min (ref >60)
Glucose, Bld: 91 mg/dL (ref 70–99)
Potassium: 3.8 mmol/L (ref 3.5–5.1)
Sodium: 140 mmol/L (ref 135–145)

## 2022-08-09 LAB — GLUCOSE, CAPILLARY
Glucose-Capillary: 118 mg/dL — ABNORMAL HIGH (ref 70–99)
Glucose-Capillary: 129 mg/dL — ABNORMAL HIGH (ref 70–99)
Glucose-Capillary: 75 mg/dL (ref 70–99)
Glucose-Capillary: 97 mg/dL (ref 70–99)

## 2022-08-09 LAB — BRAIN NATRIURETIC PEPTIDE: B Natriuretic Peptide: 671.3 pg/mL — ABNORMAL HIGH (ref 0.0–100.0)

## 2022-08-09 LAB — PROCALCITONIN: Procalcitonin: 0.22 ng/mL

## 2022-08-09 LAB — CALCITRIOL (1,25 DI-OH VIT D): Vit D, 1,25-Dihydroxy: 52.9 pg/mL (ref 24.8–81.5)

## 2022-08-09 LAB — PHOSPHORUS: Phosphorus: 2.1 mg/dL — ABNORMAL LOW (ref 2.5–4.6)

## 2022-08-09 SURGERY — SIGMOIDOSCOPY, FLEXIBLE
Anesthesia: Monitor Anesthesia Care

## 2022-08-09 MED ORDER — ENOXAPARIN SODIUM 100 MG/ML IJ SOSY
100.0000 mg | PREFILLED_SYRINGE | Freq: Two times a day (BID) | INTRAMUSCULAR | Status: DC
  Administered 2022-08-09 – 2022-08-16 (×14): 100 mg via SUBCUTANEOUS
  Filled 2022-08-09 (×14): qty 1

## 2022-08-09 MED ORDER — IPRATROPIUM-ALBUTEROL 0.5-2.5 (3) MG/3ML IN SOLN
3.0000 mL | RESPIRATORY_TRACT | Status: DC | PRN
  Administered 2022-08-09 – 2022-08-13 (×3): 3 mL via RESPIRATORY_TRACT
  Filled 2022-08-09 (×2): qty 3

## 2022-08-09 MED ORDER — PROPOFOL 500 MG/50ML IV EMUL
INTRAVENOUS | Status: DC | PRN
  Administered 2022-08-09: 100 ug/kg/min via INTRAVENOUS

## 2022-08-09 MED ORDER — FUROSEMIDE 10 MG/ML IJ SOLN
40.0000 mg | Freq: Once | INTRAMUSCULAR | Status: AC
  Administered 2022-08-09: 40 mg via INTRAVENOUS
  Filled 2022-08-09: qty 4

## 2022-08-09 MED ORDER — SODIUM PHOSPHATES 45 MMOLE/15ML IV SOLN
30.0000 mmol | Freq: Once | INTRAVENOUS | Status: AC
  Administered 2022-08-09: 30 mmol via INTRAVENOUS
  Filled 2022-08-09: qty 10

## 2022-08-09 MED ORDER — SODIUM CHLORIDE 0.9 % IV SOLN: INTRAVENOUS | Status: DC

## 2022-08-09 MED ORDER — MAGNESIUM SULFATE 4 GM/100ML IV SOLN
4.0000 g | Freq: Once | INTRAVENOUS | Status: AC
  Administered 2022-08-09: 4 g via INTRAVENOUS
  Filled 2022-08-09: qty 100

## 2022-08-09 MED ORDER — SODIUM CHLORIDE 0.9 % IV SOLN
3.0000 g | Freq: Four times a day (QID) | INTRAVENOUS | Status: AC
  Administered 2022-08-09 – 2022-08-13 (×17): 3 g via INTRAVENOUS
  Filled 2022-08-09 (×17): qty 8

## 2022-08-09 MED ORDER — PROPOFOL 10 MG/ML IV BOLUS
INTRAVENOUS | Status: DC | PRN
  Administered 2022-08-09: 30 mg via INTRAVENOUS

## 2022-08-09 MED ORDER — LACTATED RINGERS IV SOLN: INTRAVENOUS | Status: DC | PRN

## 2022-08-09 MED ORDER — SODIUM CHLORIDE 0.9 % IV SOLN: 1.5000 g | Freq: Four times a day (QID) | INTRAVENOUS | Status: DC

## 2022-08-09 NOTE — Transfer of Care (Signed)
Immediate Anesthesia Transfer of Care Note  Patient: Frank Hernandez  Procedure(s) Performed: FLEXIBLE SIGMOIDOSCOPY BIOPSY  Patient Location: Short Stay  Anesthesia Type:MAC  Level of Consciousness: awake, alert , and patient cooperative  Airway & Oxygen Therapy: Patient Spontanous Breathing and Patient connected to face mask oxygen  Post-op Assessment: Report given to RN, Post -op Vital signs reviewed and stable, and Patient moving all extremities X 4  Post vital signs: Reviewed and stable  Last Vitals:  Vitals Value Taken Time  BP 133/88   Temp    Pulse 76 08/09/22 1242  Resp 22 08/09/22 1242  SpO2 95 % 08/09/22 1242  Vitals shown include unvalidated device data.  Last Pain:  Vitals:   08/09/22 1156  TempSrc: Temporal  PainSc:       Patients Stated Pain Goal: 2 (08/05/22 1500)  Complications: No notable events documented.

## 2022-08-09 NOTE — Interval H&P Note (Signed)
History and Physical Interval Note:  08/09/2022 12:01 PM  Frank Hernandez  has presented today for surgery, with the diagnosis of diarrhea.  The various methods of treatment have been discussed with the patient and family. After consideration of risks, benefits and other options for treatment, the patient has consented to  Procedure(s) with comments: FLEXIBLE SIGMOIDOSCOPY (N/A) - propofol as a surgical intervention.  The patient's history has been reviewed, patient examined, no change in status, stable for surgery.  I have reviewed the patient's chart and labs.  Questions were answered to the patient's satisfaction.     [name] [name]

## 2022-08-09 NOTE — Progress Notes (Signed)
ANTICOAGULATION CONSULT NOTE - Follow Up  Pharmacy Consult for therapeutic LMWH Indication: pulmonary embolus and acute DVT left femoral vein  No Known Allergies  Patient Measurements: Height: 5\' 9"  (175.3 cm) Weight: 101.2 kg (223 lb 1.7 oz) IBW/kg (Calculated) : 70.7 Heparin Dosing Weight: 89 kg  Vital Signs: Temp: 98.2 F (36.8 C) (06/10 0805) Temp Source: Oral (06/10 0805) BP: 143/68 (06/10 0800) Pulse Rate: 58 (06/10 0800)  Labs: Recent Labs    08/07/22 0724 08/08/22 0300 08/08/22 1242 08/09/22 0527  HGB 7.5*  --  7.7* 7.5*  HCT 21.9*  --  23.0* 22.4*  PLT 176  --  183 162  CREATININE 1.00 0.91  --  0.90     Estimated Creatinine Clearance: 75.5 mL/min (by C-G formula based on SCr of 0.9 mg/dL).   Medical History: Past Medical History:  Diagnosis Date   Bleeding from the nose    Hypertension    Pre-diabetes     Medications: No anticoagulants PTA  Assessment: Pt is an 19 yoM with PMH significant for CKD, multiple myeloma - currently receiving chemotherapy outpatient (last dose given 5/10). CTA positive for left lower lobe segmental PE - pharmacy consulted to dose heparin on 5/30 then transition to Lovenox per Pharmacy consult on 5/31.   Today, 08/09/22 CBC: Hgb low/stable, Plt WNL SCr WNL/stable No bleeding noted Flexible Sigmoidoscopy scheduled for today   Goal of Therapy:  Treat PE Monitor platelets by anticoagulation protocol: Yes   Plan:  Continue enoxaparin 100 mg (1 mg/kg) subQ q12h at 1800 today after procedure Monitor CBC, renal function, signs of bleeding Follow for ability to transition to PO anticoagulation  Lynden Ang, PharmD, BCPS 08/09/2022,8:12 AM

## 2022-08-09 NOTE — Progress Notes (Signed)
Physical Therapy Treatment Patient Details Name: Frank Hernandez MRN: [id number] DOB: 21-Aug-1940 Today's Date: 08/09/2022   History of Present Illness 82 year old gentleman history of remote bladder cancer, pt underwent IM nail R femur d/t pathological fx +Bx on 04/17/22 hospitalized diagnosed with multiple myeloma, CKD, hypertension recently started on treatment for multiple myeloma and admitted 07/29/22 with respiratory symptoms, found to have new pulmonary embolism, also with abdominal distention for several days and several months of diarrhea with incontinence, found to have significantly dilated colon (per GI, Colonic distention, 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  Following all directions.  Feeling "better".  Motivated. Assisted OOB to amb pt progressing.  General bed mobility comments: Assisted to EOB required Max Assist for upper body and use of bed pad to complete scooting to EOB.   General transfer comment: pt able to rise from elevated bed + 2 side by side assist for safety/multiple lines.  Used B platform EVA walker for increased support. General Gait Details: pt tolerated an increased distance of 15 feet using B platform EVA walker and + 2 assist such that recliner was following. Assisted back to bed required Max Asisst to support B LE up onto bed.  Positioned to comfort and elevated B heels. Pt have a procedure later today.  LPT rec SNF, pt and Spouse "hope" he will be able to go home due to a bad SNF experience prior.   Recommendations for follow up therapy are one component of a multi-disciplinary discharge planning process, led by the attending physician.  Recommendations may be updated based on patient status, additional functional criteria and insurance authorization.  Follow Up Recommendations  Can patient physically be transported by private vehicle: No    Assistance Recommended at Discharge Frequent  or constant Supervision/Assistance  Patient can return home with the following A lot of help with walking and/or transfers;A lot of help with bathing/dressing/bathroom;Assistance with cooking/housework;Assist for transportation;Help with stairs or ramp for entrance   Equipment Recommendations  None recommended by PT    Recommendations for Other Services       Precautions / Restrictions Precautions Precautions: Fall Precaution Comments: Flexi Seal Restrictions Weight Bearing Restrictions: No Other Position/Activity Restrictions: enteric precautions     Mobility  Bed Mobility Overal bed mobility: Needs Assistance Bed Mobility: Supine to Sit, Sit to Supine   Sidelying to sit: Max assist, +2 for safety/equipment   Sit to supine: +2 for safety/equipment, +2 for physical assistance, Max assist   General bed mobility comments: Assisted to EOB required Max Assist for upper body and use of bed pad to complete scooting to EOB.  Assisted back to bed required Max Asisst to support B LE up onto bed.  Positioned to comfort and elevated B heels.    Transfers Overall transfer level: Needs assistance Equipment used: Bilateral platform walker Transfers: Sit to/from Stand Sit to Stand: Mod assist, +2 physical assistance, +2 safety/equipment           General transfer comment: pt able to rise from elevated bed + 2 side by side assist for safety/multiple lines.  Used B platform EVA walker for increased support.    Ambulation/Gait Ambulation/Gait assistance: Mod assist, +2 safety/equipment Gait Distance (Feet): 15 Feet Assistive device: Fara Boros Gait Pattern/deviations: Step-through pattern, Decreased stride length Gait velocity: decreased     General Gait Details: pt tolerated an increased distance of 15 feet using B platform EVA walker and + 2 assist  such that recliner was following.   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  Motivated.        Exercises      General Comments        Pertinent Vitals/Pain Pain Assessment Pain Assessment: Faces Faces Pain Scale: Hurts a little bit Pain Location: Peri area near FleiSeal Pain Descriptors / Indicators: Tender, Sore, Grimacing Pain Intervention(s): Monitored during session    Home Living                          Prior Function            PT Goals (current goals can now be found in the care plan section) Progress towards PT goals: Progressing toward goals    Frequency    Min 1X/week      PT Plan Current plan remains appropriate    Co-evaluation              AM-PAC PT "6 Clicks" Mobility   Outcome Measure  Help needed turning from your back to your side while in a flat bed without using bedrails?: A Lot Help needed moving from lying on your back to sitting on the side of a flat bed without using bedrails?: A Lot Help needed moving to and from a bed to a chair (including a wheelchair)?: A Lot Help needed standing up from a chair using your arms (e.g., wheelchair or bedside chair)?: A Lot Help needed to walk in hospital room?: A Lot Help needed climbing 3-5 steps with a railing? : Total 6 Click Score: 11    End of Session Equipment Utilized During Treatment: Gait belt Activity Tolerance: Patient tolerated treatment well Patient left: in bed;with call bell/phone within reach Nurse Communication: Mobility status PT Visit Diagnosis: Difficulty in walking, not elsewhere classified (R26.2);Muscle weakness (generalized) (M62.81)      Time: 1610-9604 PT Time Calculation (min) (ACUTE ONLY): 25 min  Charges:  $Gait Training: 8-22 mins $Therapeutic Activity: 8-22 mins                     Felecia Shelling  PTA Acute  Rehabilitation Services Office M-F          [phone]

## 2022-08-09 NOTE — Progress Notes (Signed)
[facility] Progress Note  Subjective: labs show K+ 3.8 today, CO2 20, B/Cr stable, Ca 6.5 (up from 6.1-6.3), corrected is 8.3 (8.9- 10.2).   Vitals:   08/09/22 0805 08/09/22 0824 08/09/22 0900 08/09/22 1000  BP:    135/67  Pulse:   72 (!) 116  Resp:   17 16  Temp: 98.2 F (36.8 C)     TempSrc: Oral     SpO2:  99% 100% 99%  Weight:      Height:        Exam: Gen alert, no distress, obese AAM, on RA No rash, cyanosis or gangrene Sclera anicteric, throat clear  No jvd or bruits Chest clear bilat to bases, no rales/ wheezing RRR no RG Abd obese soft ntnd no mass or ascites +bs GU normal male MS no joint effusions or deformity Ext 1-2+ bilat LE edema pretib, 1+ bilat UE edema Neuro is alert, Ox 3 , nf, very deconditioned        Home meds include - zovirax, asa, ensure plus, klorcon, imodium, vit D2, cardura 2 mg qd, remeron, zofran prn, compazine prn, prns/ vits/ supps     CXR 6/09 - no active disease   I/O total = 23 L in and 22 L out = + 1 L    Alb 2.1     Creat 1.83 admit --> 1.11 on 6/02, creat has been normal since then 0.87- 1.10      Vit D = 32     1,25 vit D  and  PTH pend     Assessment/ Plan: Hypokalemia - 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 GI is following. Getting po KCl 40 mEq 4-5 times per day now and K+ is up to 3.8 today. Would continue high dose KCl until K+ > 4- 4.5, then slowly cut back to a maintenance dose to keep K+ > 4.  Hypocalcemia - hx vit D deficiency yrs ago. Vit D in 30s, pth/ calcitriol pending. Cont prn IV Ca gluc. Started CaCO3 at 800mg  elemental tid. Alb is ~2, so goal for unadjusted low end of normal Ca++ would be Ca++ > 7.3. Cont po CaCO3 at 800mg  tid.  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  Diarrhea - ongoing for several months,  Cdif negative. Was rx'd for +Ecoli on GI panel w/ po abx for 3 days.  NAG metabolic acidosis - off IV sod bicarb, cont po sod bicarb, goal serum CO2 18-20 or higher.  Hypomagnesemia - cont to replace IV / PO as needed Hypophosphatemia - cont to replace IV/ PO  as needed Multiple myeloma - dx'd earlier this year, off chemo for now as not tolerating.      Vinson Moselle MD [facility] 08/09/2022, 11:54 AM  Recent Labs  Lab 08/06/22 0259 08/07/22 0724 08/08/22 0300 08/08/22 1242 08/09/22 0527  HGB 8.1*   < >  --  7.7* 7.5*  ALBUMIN 2.1*  --   --   --   --   CALCIUM 5.9*   < > 6.3*  --  6.5*  PHOS 2.3*   < > 1.9*  --  2.1*  CREATININE 0.87   < > 0.91  --  0.90  K 3.0*   < > 2.6*  --  3.8   < > = values in this interval  not displayed.   No results for input(s): "IRON", "TIBC", "FERRITIN" in the last 168 hours. Inpatient medications:  acyclovir  400 mg Oral QHS   budesonide (PULMICORT) nebulizer solution  0.25 mg Nebulization BID   calcium carbonate  800 mg of elemental calcium Oral TID   Chlorhexidine Gluconate Cloth  6 each Topical Daily   cholestyramine light  4 g Oral QID   enoxaparin (LOVENOX) injection  100 mg Subcutaneous Q12H   feeding supplement  1 Container Oral TID BM   leptospermum manuka honey  1 Application Topical Daily   multivitamin with minerals  1 tablet Oral Daily   mouth rinse  15 mL Mouth Rinse 4 times per day   pantoprazole (PROTONIX) IV  40 mg Intravenous Q12H   potassium chloride  40 mEq Oral Q6H   sodium bicarbonate  1,300 mg Oral TID   sodium chloride flush  10-40 mL Intracatheter Q12H    calcium gluconate Stopped (08/09/22 1035)   dextrose 5 % and 0.45 % NaCl with KCl 40 mEq/L 75 mL/hr at 08/09/22 0805   magnesium sulfate bolus IVPB     sodium phosphate 30 mmol in dextrose 5 % 250 mL infusion     acetaminophen **OR** acetaminophen, HYDROmorphone (DILAUDID) injection, loperamide, metoprolol tartrate, mouth rinse, pneumococcal 20-valent conjugate vaccine,  prochlorperazine, sodium chloride flush, zinc oxide

## 2022-08-09 NOTE — Progress Notes (Signed)
Upon reassessment, pt having expiratory wheezing & fine crackles on the left upper & lower lobes. Dr. Isidoro Donning notified. 40 mg of laxis ordered. No improvement. Breathing treatment given. Dr. Isidoro Donning notified of no improvement in pt. Xray chest ordered stat & labs. Dr. Sharl Ma assessed pt bedside.

## 2022-08-09 NOTE — Op Note (Signed)
[facility] Patient Name: Frank Hernandez Procedure Date: 08/09/2022 MRN: [id number] Attending MD: Doristine Locks , MD, [id number] Date of Birth: Jul 20, 1940 CSN: [id number] Age: 82 Admit Type: Inpatient Procedure:                Flexible Sigmoidoscopy w/ biopsy Indications:              Diarrhea Providers:                Doristine Locks, MD Referring MD:              Medicines:                Monitored Anesthesia Care Complications:            No immediate complications. Estimated Blood Loss:     Estimated blood loss was minimal. Procedure:                Pre-Anesthesia Assessment:                           - Prior to the procedure, a History and Physical                            was performed, and patient medications and                            allergies were reviewed. The patient's tolerance of                            previous anesthesia was also reviewed. The risks                            and benefits of the procedure and the sedation                            options and risks were discussed with the patient.                            All questions were answered, and informed consent                            was obtained. Prior Anticoagulants: The patient has                            taken no anticoagulant or antiplatelet agents. ASA                            Grade Assessment: III - A patient with severe                            systemic disease. After reviewing the risks and                            benefits, the patient was deemed in satisfactory  condition to undergo the procedure.                           After obtaining informed consent, the scope was                            passed under direct vision. The PCF-HQ190L                            ([id number]) Olympus colonoscope was introduced                            through the anus and advanced to the the left                            transverse colon.  The flexible sigmoidoscopy was                            accomplished without difficulty. The patient                            tolerated the procedure well. The quality of the                            bowel preparation was good. Findings:      Scattered areas of mildly congested mucosa was found in the proximal       sigmoid colon, in the descending colon, and in the transverse colon.       This was characterized by decreased vascularity and edema, but no       associated ulcers, erosions, or erythema. Several mucosal biopsies were       taken throughout the colon with a cold forceps for histology. Estimated       blood loss was minimal.      Multiple small-mouthed diverticula were found in the sigmoid colon. The       remainder of the visualized colon was otherwise normal appearing.      The rectum appeared normal on anterograde and retroflexed views. Impression:               - Scattered areas of mildly edematous mucosa was                            noted in the proximal sigmoid colon, in the                            descending colon and in the transverse colon. This                            was biopsied.                           - Diverticulosis in the sigmoid colon.                           - The rectum is normal. Moderate Sedation:      Not  Applicable - Patient had care per Anesthesia. Recommendation:           - Return patient to hospital ward for ongoing care.                           - Advance diet as tolerated.                           - Await pathology results.                           - Ok to resume Lovenox.                           - Resume cholestyramine and imodium                           - Continue daily BMP with electrolyte repletion per                            protocol.                           - Inpatient GI service will continue to follow.                           - Results discussed with his wife by phone. Procedure Code(s):        ---  Professional ---                           [phone], Sigmoidoscopy, flexible; with biopsy, single                            or multiple Diagnosis Code(s):        --- Professional ---                           K63.89, Other specified diseases of intestine                           R19.7, Diarrhea, unspecified                           K57.30, Diverticulosis of large intestine without                            perforation or abscess without bleeding CPT copyright 2022 [employer]. All rights reserved. The codes documented in this report are preliminary and upon coder review may  be revised to meet current compliance requirements. Doristine Locks, MD 08/09/2022 12:49:34 PM Number of Addenda: 0

## 2022-08-09 NOTE — Progress Notes (Signed)
[facility]                                                                              Frank Hernandez, is a 82 y.o. male, DOB - Aug 01, 1940, [id number] Admit date - 07/29/2022    Outpatient Primary MD for the patient is [name] (Inactive)  LOS - 11  days  No chief complaint on file.      Brief summary   Patient is a 82 year old with hypertension, prediabetes, stage IIIa CKD, pathological fracture of the right femoral, multiple myeloma presented to ED with wheezing and rhonchi. Patient has been taking antibiotics for cellulitis.  He has had worsening of his right foot wound.  He reported abdominal distention with watery stool.   Chest x-ray showed no active cardiopulmonary disease.  CTA chest showed left lower lobe segmental PE with minimal clot burden.  CT abdomen with dilated colon with air-fluid level seen and scattered colonic diverticula.  Transition seen in the area of the new left inguinal hernia involving the sigmoid colon.  No pneumatosis.  Small bowel and stomach are known dilated but there is an enteric tube.  Separate large known right inguinal hernia involving the small bowel and significant portion of the urinary bladder.  Lytic bone lesion consistent with known history of myeloma.   Patient was admitted with PE and a small bowel obstruction.  Surgery and GI consulted. 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 Diarrhea thought to be related to daratumnunab.    Assessment & Plan    Principal Problem: Acute left lower lobe segmental pulmonary embolism,  acute DVT left femoral vein -CTA chest showed left lower lobe segmental PE, minimal clot burden -Venous Doppler consistent with acute DVT involving left common femoral vein, indeterminate age DVT. -Lovenox held for flex sig -2D echo showed normal RV function  SBO; Pseudo-obstruction Ogilvie Syndrome, colonic distention: -Presents with obstructive  symptoms, CT abdomen pelvis showed Dilated colon with air-fluid level seen and scattered colonic diverticula. Transition seen in the area of the new left inguinal hernia involving the sigmoid colon. No pneumatosis, free air or fluid. Small bowel and stomach are nondilated -NG tube  has been removed by General Surgery.  -Per GI: Ogilvie's/Pseudo-obstruction.    Diarrhea; - C diff negative. Having issues with Diarrhea since last hospitalization.  -GI pathogen  positive for enteropathogenic E coli, completed azithromycin for 3 days.  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  GI adding low-dose Imodium -Continues to have severe diarrhea, on cholestyramine 4 times daily, GI added Imodium as needed  -flex sig today with biopsies    NAG Metabolic Acidosis: in setting of GI losses, profound electrolyte abnormalities -Bicarb improved, changed IV fluids to D5 with potassium replacement to keep up with the losses and profound electrolyte imbalances -Nephrology consulted  Hypomagnesemia, hypokalemia -Placed on aggressive replacement, potassium improving today  -  Nephrology following.  Pharmacy assisting with replacement of electrolytes.  Hypophosphatemia -Na-Phos x1 IV today  Hypocalcemia -Placed on twice daily calcium replacement -Vitamin D 34.7, calcitriol, PTH pending  Cellulitis of both lower extremity: Completed 5 days Ancef.    Prolonged QT   Replete electrolytes. QT 450    Hypertension:  -BP currently stable   Pre-Diabetes: On diet controlled.    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 -Continue as needed Tylenol   Multiple myeloma -Oncology following   Pressure Injury Documentation: Pressure Injury 03/29/22 Buttocks Right Stage 1 -  Intact skin with non-blanchable redness of a localized area usually over a bony prominence. (Active)  03/29/22 2140  Location: Buttocks  Location Orientation: Right  Staging: Stage 1 -  Intact skin with non-blanchable redness of a localized area usually over a bony prominence.  Wound Description (Comments):   Present on Admission: Yes     Pressure Injury 05/28/22 Heel Right Stage 3 -  Full thickness tissue loss. Subcutaneous fat may be visible but bone, tendon or muscle are NOT exposed. Open wound, broken from pressure & dry skin (Active)  05/28/22 1400  Location: Heel  Location Orientation: Right  Staging: Stage 3 -  Full thickness tissue loss. Subcutaneous fat may be visible but bone, tendon or muscle are NOT exposed.  Wound Description (Comments): Open wound, broken from pressure & dry skin  Present on Admission: Yes  Dressing Type Foam - Lift dressing to assess site every shift 08/09/22 0800     Pressure Injury 08/08/22 Buttocks Right;Left Deep Tissue Pressure Injury - Purple or maroon localized area of discolored intact skin or blood-filled blister due to damage of underlying soft tissue from pressure and/or shear. (Active)  08/08/22 1241  Location: Buttocks  Location Orientation: Right;Left  Staging: Deep Tissue Pressure Injury - Purple or maroon localized area of discolored intact skin or blood-filled blister due to damage of underlying soft tissue from pressure and/or shear.  Wound Description (Comments):   Present on Admission:   Dressing Type Foam - Lift dressing to assess site every shift 08/09/22 0800   Mild to moderate protein  calorie malnutrition, hypoalbuminemia Nutrition Problem: Inadequate oral intake Etiology: acute illness (small bowel obstruction) Signs/Symptoms: energy intake < 75% for > 7 days Interventions: Refer to RD note for recommendations, Boost Breeze, MVI Estimated body mass index is 32.95 kg/m as calculated from the following:   Height as of this encounter: 5\' 9"  (1.753 m).   Weight as of this encounter: 101.2 kg.  Code Status: Full code DVT Prophylaxis:  Lovenox, currently held   Level of Care: Level of care: Stepdown Family Communication: Updated patient Disposition Plan:      Remains inpatient appropriate:      Procedures:  2D echo  Consultants:   General surgery GI Nephrology  Antimicrobials:   Anti-infectives (From admission, onward)    Start     Dose/Rate Route Frequency Ordered Stop   08/01/22 1400  ceFAZolin (ANCEF) IVPB 2g/100 mL premix        2 g 200 mL/hr over 30 Minutes Intravenous Every 8 hours 08/01/22 0824 08/03/22 0700   07/31/22 1600  azithromycin (ZITHROMAX) tablet 500 mg        500 mg Oral  Daily 07/31/22 1446 08/02/22 1013   07/31/22 1000  vancomycin (VANCOREADY) IVPB 1500 mg/300 mL  Status:  Discontinued        1,500 mg 150 mL/hr over 120 Minutes Intravenous Every 48 hours 07/29/22 1243 07/30/22 0755   07/30/22 2200  [MAR Hold]  acyclovir (ZOVIRAX) tablet 400 mg        (MAR Hold since Mon 08/09/2022 at 1156.Hold Reason: Transfer to a Procedural area)   400 mg Oral Daily at bedtime 07/30/22 1843     07/30/22 1400  ceFAZolin (ANCEF) IVPB 2g/100 mL premix  Status:  Discontinued        2 g 200 mL/hr over 30 Minutes Intravenous Every 8 hours 07/30/22 0843 08/01/22 0824   07/30/22 0900  doxycycline (VIBRAMYCIN) 100 mg in sodium chloride 0.9 % 250 mL IVPB  Status:  Discontinued        100 mg 125 mL/hr over 120 Minutes Intravenous Every 12 hours 07/30/22 0809 07/31/22 1445   07/29/22 1800  ceFEPIme (MAXIPIME) 2 g in sodium chloride 0.9 % 100 mL IVPB  Status:   Discontinued        2 g 200 mL/hr over 30 Minutes Intravenous Every 12 hours 07/29/22 1234 07/30/22 0841   07/29/22 1230  metroNIDAZOLE (FLAGYL) IVPB 500 mg  Status:  Discontinued        500 mg 100 mL/hr over 60 Minutes Intravenous Every 12 hours 07/29/22 1205 07/30/22 0909   07/29/22 0930  piperacillin-tazobactam (ZOSYN) IVPB 3.375 g        3.375 g 100 mL/hr over 30 Minutes Intravenous  Once 07/29/22 0920 07/29/22 1038   07/29/22 0930  vancomycin (VANCOREADY) IVPB 2000 mg/400 mL        2,000 mg 200 mL/hr over 120 Minutes Intravenous  Once 07/29/22 0923 07/29/22 1246          Medications  [MAR Hold] acyclovir  400 mg Oral QHS   [MAR Hold] budesonide (PULMICORT) nebulizer solution  0.25 mg Nebulization BID   [MAR Hold] calcium carbonate  800 mg of elemental calcium Oral TID   [MAR Hold] Chlorhexidine Gluconate Cloth  6 each Topical Daily   [MAR Hold] cholestyramine light  4 g Oral QID   [MAR Hold] enoxaparin (LOVENOX) injection  100 mg Subcutaneous Q12H   [MAR Hold] feeding supplement  1 Container Oral TID BM   [MAR Hold] leptospermum manuka honey  1 Application Topical Daily   [MAR Hold] multivitamin with minerals  1 tablet Oral Daily   [MAR Hold] mouth rinse  15 mL Mouth Rinse 4 times per day   [MAR Hold] pantoprazole (PROTONIX) IV  40 mg Intravenous Q12H   [MAR Hold] potassium chloride  40 mEq Oral Q6H   [MAR Hold] sodium bicarbonate  1,300 mg Oral TID   [MAR Hold] sodium chloride flush  10-40 mL Intracatheter Q12H      Subjective:   Frank Hernandez was seen and examined today.  Seen this morning, continues to have high-volume diarrhea.  No fever chills, nausea vomiting or significant abdominal pain.   Objective:   Vitals:   08/09/22 0900 08/09/22 1000 08/09/22 1156 08/09/22 1240  BP:  135/67 (!) 127/57 135/88  Pulse: 72 (!) 116 73 78  Resp: 17 16 (!) 23 (!) 23  Temp:   97.8 F (36.6 C)   TempSrc:   Temporal   SpO2: 100% 99% 100% 94%  Weight:      Height:         Intake/Output Summary (Last  24 hours) at 08/09/2022 1244 Last data filed at 08/09/2022 1234 Gross per 24 hour  Intake 2501.8 ml  Output 1885 ml  Net 616.8 ml     Wt Readings from Last 3 Encounters:  08/07/22 101.2 kg  07/16/22 90.5 kg  07/09/22 91.9 kg   Physical Exam General: Alert and oriented x 3, NAD Cardiovascular: S1 S2 clear, RRR.  Respiratory: CTAB, no wheezing Gastrointestinal: Soft, nontender, nondistended, NBS Ext: no pedal edema bilaterally Psych: pleasant  Data Reviewed:  I have personally reviewed following labs    CBC Lab Results  Component Value Date   WBC 4.1 08/09/2022   RBC 2.39 (L) 08/09/2022   HGB 7.5 (L) 08/09/2022   HCT 22.4 (L) 08/09/2022   MCV 93.7 08/09/2022   MCH 31.4 08/09/2022   PLT 162 08/09/2022   MCHC 33.5 08/09/2022   RDW 16.2 (H) 08/09/2022   LYMPHSABS 2.0 07/29/2022   MONOABS 1.0 07/29/2022   EOSABS 0.2 07/29/2022   BASOSABS 0.0 07/29/2022     Last metabolic panel Lab Results  Component Value Date   NA 140 08/09/2022   K 3.8 08/09/2022   CL 112 (H) 08/09/2022   CO2 20 (L) 08/09/2022   BUN <5 (L) 08/09/2022   CREATININE 0.90 08/09/2022   GLUCOSE 91 08/09/2022   GFRNONAA >60 08/09/2022   GFRAA >60 12/27/2015   CALCIUM 6.5 (L) 08/09/2022   PHOS 2.1 (L) 08/09/2022   PROT 4.3 (L) 08/06/2022   ALBUMIN 2.1 (L) 08/06/2022   LABGLOB 2.3 07/16/2022   AGRATIO 0.9 04/14/2022   BILITOT 0.4 08/06/2022   ALKPHOS 66 08/06/2022   AST 25 08/06/2022   ALT 5 08/06/2022   ANIONGAP 8 08/09/2022    CBG (last 3)  Recent Labs    08/08/22 1743 08/08/22 2350 08/09/22 0500  GLUCAP 132* 121* 97      Coagulation Profile: No results for input(s): "INR", "PROTIME" in the last 168 hours.   Radiology Studies: I have personally reviewed the imaging studies  DG CHEST PORT 1 VIEW  Result Date: 08/08/2022 CLINICAL DATA:  PICC line placement. EXAM: PORTABLE CHEST 1 VIEW COMPARISON:  07/30/2022 FINDINGS: Lungs are clear.  Cardiopericardial silhouette is at upper limits of normal for size. Right PICC line is new in the interval with tip overlying the mid SVC level. Telemetry leads overlie the chest. IMPRESSION: Right PICC line tip overlies the mid SVC level. Electronically Signed   By: Kennith Center M.D.   On: 08/08/2022 09:43   [place] EKG SITE RITE  Result Date: 08/07/2022 If Site Rite image not attached, placement could not be confirmed due to current cardiac rhythm.      Thad Ranger M.D. [facility] 08/09/2022, 12:44 PM  Available via Epic secure chat 7am-7pm After 7 pm, please refer to night coverage provider listed on amion.

## 2022-08-09 NOTE — Consult Note (Addendum)
WOC assistance requested for use of Interdry.  Discussed patient via secure chat with bedside nurse; they described skin folds as being red, macerated, and moist with partial thickness skin loss.  Appearance is consistent with intertrigo. Interdry silver-impregnated fabric ordered for use by bedside nurses and instructions provided. This product should remain in place for 5 days for optimal plan of care to provide antimicrobial benefits and wick moisture away from skin.  Please re-consult if further assistance is needed.  Thank-you,  Cammie Mcgee MSN, RN, CWOCN, [place], CNS [phone]

## 2022-08-09 NOTE — Progress Notes (Signed)
Nutrition Follow-up  DOCUMENTATION CODES:   Not applicable  INTERVENTION:  -  Clear Liquid diet per MD. Advance as medically appropriate.  - Boost Breeze po TID, each supplement provides 250 kcal and 9 grams of protein - Encourage intake at all meals and of supplements.  - Multivitamin with minerals daily to support micronutrient needs.  - Monitor weight trends.    NUTRITION DIAGNOSIS:   Inadequate oral intake related to acute illness (small bowel obstruction) as evidenced by energy intake < 75% for > 7 days. *ongoing  GOAL:   Patient will meet greater than or equal to 90% of their needs *unmet  MONITOR:   PO intake, Supplement acceptance, Diet advancement, Weight trends, Labs  REASON FOR ASSESSMENT:   Malnutrition Screening Tool    ASSESSMENT:   26- y.o male with PMH significant for HTN, prediabetes, stage IIIa CKD, multiple myeloma who presented due to wheezing and rhonchi. Admitted for small bowel obstruction.  5/30 Admit 5/31 Clear Liquids 6/1 Soft diet 6/2 Regular diet 6/3 DYS 1 diet 6/4 Clear liquids 6/5 Full liquids 6/6 Soft diet 6/9 Clear Liquids  Patient out of room at time of visit. OR today for flexible sigmoidoscopy with biopsy.   Patient has no intake documented to determine how he has been eating the past few days. Thankfully he has been drinking [employer], drinking 2-3 times a day.   GI has been following patient. 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 Of note, he has been having diarrhea since admission that has resulted in multiple electrolyte abnormalities. Imdium and cholestyramine ordered. Biopsy today  Will continue to follow and monitor for nutritional plans.   Admit weight: 199# Current weight: 223# Suspect current weight elevated due to moderated and generalized edema.  Medications reviewed and include: MVI  Labs reviewed:  Phosphorus 2.1 Magnesium 1.6   Diet Order:   Diet Order             Diet clear  liquid Room service appropriate? Yes; Fluid consistency: Thin  Diet effective now                   EDUCATION NEEDS:  Education needs have been addressed  Skin:  Skin Assessment: Skin Integrity Issues: Skin Integrity Issues:: DTI DTI: Right buttocks Stage III: Right heel  Last BM:  6/9 - fecal management system  Height:  Ht Readings from Last 1 Encounters:  07/29/22 5\' 9"  (1.753 m)   Weight:  Wt Readings from Last 1 Encounters:  08/07/22 101.2 kg   Ideal Body Weight:     BMI:  Body mass index is 32.95 kg/m.  Estimated Nutritional Needs:  Kcal:  1900-2100 kcals Protein:  100-115 grams Fluid:  >/= 1.9L    Shelle Iron RD, LDN For contact information, refer to [facility].

## 2022-08-09 NOTE — Progress Notes (Signed)
Pt wheezy after being turned for peri care. Dr. Isidoro Donning notified. Suggested a breathing treatment for pt. No new orders at this time. 2L [place] placed on pt.

## 2022-08-09 NOTE — Progress Notes (Signed)
Patient reports being NPO. Potassium 3.8. hgb 7.5. continue with tap water enema prep for flex sig scheduled for 1200 today

## 2022-08-09 NOTE — Anesthesia Postprocedure Evaluation (Signed)
Anesthesia Post Note  Patient: JOSPEH MANGEL  Procedure(s) Performed: FLEXIBLE SIGMOIDOSCOPY BIOPSY     Patient location during evaluation: Endoscopy Anesthesia Type: MAC Level of consciousness: awake and alert Pain management: pain level controlled Vital Signs Assessment: post-procedure vital signs reviewed and stable Respiratory status: spontaneous breathing, nonlabored ventilation, respiratory function stable and patient connected to nasal cannula oxygen Cardiovascular status: blood pressure returned to baseline and stable Postop Assessment: no apparent nausea or vomiting Anesthetic complications: no   No notable events documented.  Last Vitals:  Vitals:   08/09/22 1548 08/09/22 1600  BP:  (!) 152/74  Pulse:  85  Resp:  (!) 39  Temp: 36.8 C   SpO2:  92%    Last Pain:  Vitals:   08/09/22 1600  TempSrc:   PainSc: 0-No pain                 Trevor Iha

## 2022-08-10 ENCOUNTER — Inpatient Hospital Stay (HOSPITAL_COMMUNITY): Payer: Medicare PPO

## 2022-08-10 DIAGNOSIS — L03115 Cellulitis of right lower limb: Secondary | ICD-10-CM | POA: Diagnosis not present

## 2022-08-10 DIAGNOSIS — R197 Diarrhea, unspecified: Secondary | ICD-10-CM | POA: Diagnosis not present

## 2022-08-10 DIAGNOSIS — L03116 Cellulitis of left lower limb: Secondary | ICD-10-CM

## 2022-08-10 DIAGNOSIS — R609 Edema, unspecified: Secondary | ICD-10-CM | POA: Diagnosis not present

## 2022-08-10 DIAGNOSIS — I2693 Single subsegmental pulmonary embolism without acute cor pulmonale: Secondary | ICD-10-CM | POA: Diagnosis not present

## 2022-08-10 DIAGNOSIS — R0600 Dyspnea, unspecified: Secondary | ICD-10-CM | POA: Diagnosis not present

## 2022-08-10 DIAGNOSIS — K56609 Unspecified intestinal obstruction, unspecified as to partial versus complete obstruction: Secondary | ICD-10-CM | POA: Diagnosis not present

## 2022-08-10 LAB — CBC
HCT: 22.6 % — ABNORMAL LOW (ref 39.0–52.0)
Hemoglobin: 7.6 g/dL — ABNORMAL LOW (ref 13.0–17.0)
MCH: 31.8 pg (ref 26.0–34.0)
MCHC: 33.6 g/dL (ref 30.0–36.0)
MCV: 94.6 fL (ref 80.0–100.0)
Platelets: 180 10*3/uL (ref 150–400)
RBC: 2.39 MIL/uL — ABNORMAL LOW (ref 4.22–5.81)
RDW: 15.7 % — ABNORMAL HIGH (ref 11.5–15.5)
WBC: 9.3 10*3/uL (ref 4.0–10.5)
nRBC: 0 % (ref 0.0–0.2)

## 2022-08-10 LAB — BASIC METABOLIC PANEL
Anion gap: 8 (ref 5–15)
BUN: <5 mg/dL — ABNORMAL LOW (ref 8–23)
CO2: 19 mmol/L — ABNORMAL LOW (ref 22–32)
Calcium: 6.9 mg/dL — ABNORMAL LOW (ref 8.9–10.3)
Chloride: 114 mmol/L — ABNORMAL HIGH (ref 98–111)
Creatinine, Ser: 0.98 mg/dL (ref 0.61–1.24)
GFR, Estimated: >60 mL/min (ref >60)
Glucose, Bld: 93 mg/dL (ref 70–99)
Potassium: 3.6 mmol/L (ref 3.5–5.1)
Sodium: 141 mmol/L (ref 135–145)

## 2022-08-10 LAB — GLUCOSE, CAPILLARY
Glucose-Capillary: 102 mg/dL — ABNORMAL HIGH (ref 70–99)
Glucose-Capillary: 73 mg/dL (ref 70–99)
Glucose-Capillary: 80 mg/dL (ref 70–99)
Glucose-Capillary: 82 mg/dL (ref 70–99)
Glucose-Capillary: 99 mg/dL (ref 70–99)

## 2022-08-10 LAB — ALBUMIN: Albumin: 2.5 g/dL — ABNORMAL LOW (ref 3.5–5.0)

## 2022-08-10 LAB — PARATHYROID HORMONE, INTACT (NO CA): PTH: 543 pg/mL — ABNORMAL HIGH (ref 15–65)

## 2022-08-10 LAB — PHOSPHORUS: Phosphorus: 3.3 mg/dL (ref 2.5–4.6)

## 2022-08-10 LAB — SURGICAL PATHOLOGY

## 2022-08-10 LAB — MAGNESIUM: Magnesium: 2.1 mg/dL (ref 1.7–2.4)

## 2022-08-10 MED ORDER — LOPERAMIDE HCL 2 MG PO CAPS
2.0000 mg | ORAL_CAPSULE | Freq: Two times a day (BID) | ORAL | Status: DC
  Administered 2022-08-10 – 2022-08-11 (×2): 2 mg via ORAL
  Filled 2022-08-10 (×2): qty 1

## 2022-08-10 MED ORDER — CHOLESTYRAMINE LIGHT 4 G PO PACK
4.0000 g | PACK | Freq: Two times a day (BID) | ORAL | Status: DC
  Administered 2022-08-10 – 2022-08-16 (×13): 4 g via ORAL
  Filled 2022-08-10 (×13): qty 1

## 2022-08-10 MED ORDER — POTASSIUM CHLORIDE CRYS ER 20 MEQ PO TBCR
40.0000 meq | EXTENDED_RELEASE_TABLET | Freq: Three times a day (TID) | ORAL | Status: DC
  Administered 2022-08-10 – 2022-08-13 (×10): 40 meq via ORAL
  Filled 2022-08-10 (×10): qty 2

## 2022-08-10 MED ORDER — FUROSEMIDE 10 MG/ML IJ SOLN
40.0000 mg | Freq: Once | INTRAMUSCULAR | Status: AC
  Administered 2022-08-10: 40 mg via INTRAVENOUS
  Filled 2022-08-10: qty 4

## 2022-08-10 MED ORDER — ALBUMIN HUMAN 25 % IV SOLN
25.0000 g | Freq: Four times a day (QID) | INTRAVENOUS | Status: AC
  Administered 2022-08-10 (×2): 25 g via INTRAVENOUS
  Filled 2022-08-10 (×2): qty 100

## 2022-08-10 NOTE — Progress Notes (Addendum)
Progress Note    LOS: 12 days   Chief Complaint:Diarrhea   Subjective   Patient states he is feeling well this morning. Denies abdominal pain, nausea, and vomiting. Tolerating full liquids without difficulty and would like to move up to soft diet if possible. He feels his bowel movements have gotten better.  550 stool output in the last 12 hours    Objective   Vital signs in last 24 hours: Temp:  [97.8 F (36.6 C)-99.7 F (37.6 C)] 98.8 F (37.1 C) (06/11 0805) Pulse Rate:  [59-116] 74 (06/11 0600) Resp:  [16-39] 22 (06/11 0600) BP: (112-152)/(44-102) 128/77 (06/11 0600) SpO2:  [92 %-100 %] 95 % (06/11 0849) Last BM Date : 08/08/22 Last BM recorded by nurses in past 5 days Stool Type: Type 7 (Liquid consistency with no solid pieces) (08/09/2022  8:00 PM)  General:   male in no acute distress  Heart:  Regular rate and rhythm; no murmurs Pulm: Clear anteriorly; no wheezing Abdomen: soft, nondistended, normal bowel sounds in all quadrants. Nontender without guarding. No organomegaly appreciated. Extremities:  No edema Neurologic:  Alert and  oriented x4;  No focal deficits.  Psych:  Cooperative. Normal mood and affect.  Intake/Output from previous day: 06/10 0701 - 06/11 0700 In: 2109.6 [I.V.:1226; IV Piggyback:673.6] Out: 2992 [Urine:1800; Stool:1192] Intake/Output this shift: No intake/output data recorded.  Studies/Results: DG CHEST PORT 1 VIEW  Result Date: 08/09/2022 CLINICAL DATA:  Wheezing noted after sigmoidoscopy procedure today. Dyspnea. EXAM: PORTABLE CHEST 1 VIEW COMPARISON:  08/08/2022 FINDINGS: Shallow inspiration. Heart size and pulmonary vascularity are normal for technique. Right PICC line with tip over the low SVC region. No pneumothorax. Interval development of a focal area of consolidation in the left lower lung. This may represent pneumonia, aspiration, or atelectasis. Right lung is clear. No pleural effusions. Calcified and tortuous aorta.  Degenerative changes in the spine and shoulders. IMPRESSION: New left lower lung consolidation. Electronically Signed   By: Burman Nieves M.D.   On: 08/09/2022 18:15    Lab Results: Recent Labs    08/08/22 1242 08/09/22 0527 08/10/22 0531  WBC 4.3 4.1 9.3  HGB 7.7* 7.5* 7.6*  HCT 23.0* 22.4* 22.6*  PLT 183 162 180   BMET Recent Labs    08/08/22 0300 08/09/22 0527 08/10/22 0531  NA 138 140 141  K 2.6* 3.8 3.6  CL 110 112* 114*  CO2 20* 20* 19*  GLUCOSE 100* 91 93  BUN <5* <5* <5*  CREATININE 0.91 0.90 0.98  CALCIUM 6.3* 6.5* 6.9*   LFT Recent Labs    08/10/22 0531  ALBUMIN 2.5*   PT/INR No results for input(s): "LABPROT", "INR" in the last 72 hours.   Scheduled Meds:  acyclovir  400 mg Oral QHS   budesonide (PULMICORT) nebulizer solution  0.25 mg Nebulization BID   calcium carbonate  800 mg of elemental calcium Oral TID   Chlorhexidine Gluconate Cloth  6 each Topical Daily   cholestyramine light  4 g Oral BID   enoxaparin (LOVENOX) injection  100 mg Subcutaneous Q12H   feeding supplement  1 Container Oral TID BM   furosemide  40 mg Intravenous Once   leptospermum manuka honey  1 Application Topical Daily   loperamide  2 mg Oral BID   multivitamin with minerals  1 tablet Oral Daily   mouth rinse  15 mL Mouth Rinse 4 times per day   pantoprazole (PROTONIX) IV  40 mg Intravenous Q12H   potassium chloride  40 mEq Oral TID BM   sodium bicarbonate  1,300 mg Oral TID   sodium chloride flush  10-40 mL Intracatheter Q12H   Continuous Infusions:  albumin human     ampicillin-sulbactam (UNASYN) IV Stopped (08/10/22 7829)   calcium gluconate Stopped (08/09/22 2241)      Patient profile:   82 year old male with multiple myeloma, currently receiving treatment, admitted with respiratory symptoms, found to have new pulmonary embolism being evaluated for profuse diarrhea and electrolyte imbalances.    Impression:   Diarrhea -Previously diagnosed with E. coli and  given 3-day course of azithromycin.  Most recent stool studies show negative C. difficile and negative E. coli (negative GI pathogen panel) - Previous pseudoobstruction with recent abdominal x-ray showing resolution. - 550 stool output in the last 12 hours  - phosphorus 3.3 - magnesium 2.1 - Flexible sigmoidoscopy 6/10: scattered area of edematous mucosa in proximal sigmoid colon, descending colon, and transverse. Diverticulosis. Biopsies pending.    Plan:   - await biopsy results - can decrease cholestyramine to twice daily to help better administer other medications. Scheduled imodium twice daily (can increase if needed). - continue to monitor and correct electrolytes - resume lovenox   Bayley M McMichael  08/10/2022, 9:15 AM     [facility] Attending   I have taken an interval history, reviewed the chart and examined the patient. I agree with the Advanced Practitioner's note, impression and recommendations.   Iva Boop, MD, [facility] See Loretha Stapler on call - gastroenterology for best contact person 08/10/2022 5:02 PM

## 2022-08-10 NOTE — Progress Notes (Signed)
[facility]                                                                              Frank Hernandez, is a 82 y.o. male, DOB - [birth year], ZOX:[id number] Admit date - 07/29/2022    Outpatient Primary MD for the patient is [name] (Inactive)  LOS - 12  days  No chief complaint on file.      Brief summary   Patient is a 82 year old with hypertension, prediabetes, stage IIIa CKD, pathological fracture of the right femoral, multiple myeloma presented to ED with wheezing and rhonchi. Patient has been taking antibiotics for cellulitis.  He has had worsening of his right foot wound.  He reported abdominal distention with watery stool.   Chest x-ray showed no active cardiopulmonary disease.  CTA chest showed left lower lobe segmental PE with minimal clot burden.  CT abdomen with dilated colon with air-fluid level seen and scattered colonic diverticula.  Transition seen in the area of the new left inguinal hernia involving the sigmoid colon.  No pneumatosis.  Small bowel and stomach are known dilated but there is an enteric tube.  Separate large known right inguinal hernia involving the small bowel and significant portion of the urinary bladder.  Lytic bone lesion consistent with known history of myeloma.   Patient was admitted with PE and a small bowel obstruction.  Surgery and GI consulted. 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 Diarrhea thought to be related to daratumnunab.    Assessment & Plan    Principal Problem: Acute left lower lobe segmental pulmonary embolism,  acute DVT left femoral vein -CTA chest showed left lower lobe segmental PE, minimal clot burden -Venous Doppler consistent with acute DVT involving left common femoral vein, indeterminate age DVT. --2D echo showed normal RV function -Lovenox resumed  SBO; Pseudo-obstruction Ogilvie Syndrome, colonic distention: -Presents with obstructive symptoms, CT  abdomen pelvis showed Dilated colon with air-fluid level seen and scattered colonic diverticula. Transition seen in the area of the new left inguinal hernia involving the sigmoid colon. No pneumatosis, free air or fluid. Small bowel and stomach are nondilated -NG tube  has been removed by General Surgery.  -Per GI: Ogilvie's/Pseudo-obstruction.  -Tolerating clear liquid diet, diet advanced to full liquids   Diarrhea; - C diff negative. Having issues with Diarrhea since last hospitalization.  -GI pathogen panel positive for enteropathogenic E coli, completed azithromycin for 3 days.  -Thought to be related to Chemo: Daratumumab however seen by oncology, Dr. 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  Continue cholestyramine, Imodium -flex sig today on 6/10 showed scattered areas of mildly edematous mucosa in the proximal sigmoid colon, descending colon and transverse colon, biopsied   Acute respiratory failure with hypoxia, left lower lobe pneumonia ?  Aspiration -On 6/10, patient noted to be short of breath with wheezing -BNP 671.3, procalcitonin 0.22, chest x-ray showed new left lower lung consolidation -Received  Lasix 40 mg IV x 1 with significant output, KVO IV fluids -Still has mild wheezing, on 2 L O2 via [place], will give 1 more dose of Lasix today, IV albumin x 2 (third spacing due to hypoalbuminemia, albumin 2.5) -Placed on Unasyn IV -Venous Doppler upper extremity, negative for DVT RUE   NAG Metabolic Acidosis: in setting of GI losses, profound electrolyte abnormalities --Nephrology consulted  Hypomagnesemia, hypokalemia -Placed on aggressive replacement  -Nephrology following.  Pharmacy assisting with replacement of electrolytes.  Hypophosphatemia -Na-Phos  x1 IV today  Hypocalcemia -Placed on twice daily calcium replacement -Vitamin D 34.7, calcitriol, PTH pending  Cellulitis of both lower extremity: Completed 5 days Ancef.    Prolonged QT   Replete electrolytes. QT 450    Hypertension:  -BP currently stable   Pre-Diabetes: On diet controlled.    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 -Continue as needed Tylenol   Multiple myeloma -Oncology following   Pressure Injury Documentation: Pressure Injury 03/29/22 Buttocks Right Stage 1 -  Intact skin with non-blanchable redness of a localized area usually over a bony prominence. (Active)  03/29/22 2140  Location: Buttocks  Location Orientation: Right  Staging: Stage 1 -  Intact skin with non-blanchable redness of a localized area usually over a bony prominence.  Wound Description (Comments):   Present on Admission: Yes     Pressure Injury 05/28/22 Heel Right Stage 3 -  Full thickness tissue loss. Subcutaneous fat may be visible but bone, tendon or muscle are NOT exposed. Open wound, broken from pressure & dry skin (Active)  05/28/22 1400  Location: Heel  Location Orientation: Right  Staging: Stage 3 -  Full thickness tissue loss. Subcutaneous fat may be visible but bone, tendon or muscle are NOT exposed.  Wound Description (Comments): Open wound, broken from pressure & dry skin  Present on Admission: Yes  Dressing Type Foam - Lift dressing to assess site every shift 08/10/22 1000     Pressure Injury 08/08/22 Buttocks Right;Left Deep Tissue Pressure Injury - Purple or maroon localized area of discolored intact skin or blood-filled blister due to damage of underlying soft tissue from pressure and/or shear.  (Active)  08/08/22 1241  Location: Buttocks  Location Orientation: Right;Left  Staging: Deep Tissue Pressure Injury - Purple or maroon localized area of discolored intact skin or blood-filled blister due to damage of underlying soft tissue from pressure and/or shear.  Wound Description (Comments):   Present on Admission:   Dressing Type Foam - Lift dressing to assess site every shift 08/10/22 1000   Mild to moderate protein calorie malnutrition, hypoalbuminemia Nutrition Problem: Inadequate oral intake Etiology: acute illness (small bowel obstruction) Signs/Symptoms: energy intake < 75% for > 7 days Interventions: Refer to RD note for recommendations, Boost Breeze, MVI Estimated body mass index is 32.95 kg/m as calculated from the following:   Height as of this encounter: 5\' 9"  (1.753 m).   Weight as of this encounter: 101.2 kg.  Code Status: Full code DVT Prophylaxis:  Lovenox  Level of Care: Level of care: Stepdown Family Communication: Updated patient Disposition Plan:      Remains inpatient appropriate:      Procedures:  2D echo  Consultants:   General surgery GI Nephrology  Antimicrobials:  Anti-infectives (From admission, onward)    Start     Dose/Rate Route Frequency Ordered Stop   08/09/22 2000  Ampicillin-Sulbactam (UNASYN) 3 g in sodium chloride 0.9 % 100 mL IVPB        3 g 200 mL/hr over 30 Minutes Intravenous Every 6 hours 08/09/22 1904     08/09/22 1945  ampicillin-sulbactam (UNASYN) 1.5 g in sodium chloride 0.9 % 100 mL IVPB  Status:  Discontinued        1.5 g 200 mL/hr over 30 Minutes Intravenous Every 6 hours 08/09/22 1849 08/09/22 1904   08/01/22 1400  ceFAZolin (ANCEF) IVPB 2g/100 mL premix        2 g 200 mL/hr over 30 Minutes Intravenous Every 8 hours 08/01/22 0824 08/03/22 0700   07/31/22 1600  azithromycin (ZITHROMAX) tablet 500 mg        500 mg Oral Daily 07/31/22 1446 08/02/22 1013   07/31/22 1000  vancomycin (VANCOREADY) IVPB 1500 mg/300 mL   Status:  Discontinued        1,500 mg 150 mL/hr over 120 Minutes Intravenous Every 48 hours 07/29/22 1243 07/30/22 0755   07/30/22 2200  acyclovir (ZOVIRAX) tablet 400 mg        400 mg Oral Daily at bedtime 07/30/22 1843     07/30/22 1400  ceFAZolin (ANCEF) IVPB 2g/100 mL premix  Status:  Discontinued        2 g 200 mL/hr over 30 Minutes Intravenous Every 8 hours 07/30/22 0843 08/01/22 0824   07/30/22 0900  doxycycline (VIBRAMYCIN) 100 mg in sodium chloride 0.9 % 250 mL IVPB  Status:  Discontinued        100 mg 125 mL/hr over 120 Minutes Intravenous Every 12 hours 07/30/22 0809 07/31/22 1445   07/29/22 1800  ceFEPIme (MAXIPIME) 2 g in sodium chloride 0.9 % 100 mL IVPB  Status:  Discontinued        2 g 200 mL/hr over 30 Minutes Intravenous Every 12 hours 07/29/22 1234 07/30/22 0841   07/29/22 1230  metroNIDAZOLE (FLAGYL) IVPB 500 mg  Status:  Discontinued        500 mg 100 mL/hr over 60 Minutes Intravenous Every 12 hours 07/29/22 1205 07/30/22 0909   07/29/22 0930  piperacillin-tazobactam (ZOSYN) IVPB 3.375 g        3.375 g 100 mL/hr over 30 Minutes Intravenous  Once 07/29/22 0920 07/29/22 1038   07/29/22 0930  vancomycin (VANCOREADY) IVPB 2000 mg/400 mL        2,000 mg 200 mL/hr over 120 Minutes Intravenous  Once 07/29/22 0923 07/29/22 1246          Medications  acyclovir  400 mg Oral QHS   budesonide (PULMICORT) nebulizer solution  0.25 mg Nebulization BID   calcium carbonate  800 mg of elemental calcium Oral TID   Chlorhexidine Gluconate Cloth  6 each Topical Daily   cholestyramine light  4 g Oral BID   enoxaparin (LOVENOX) injection  100 mg Subcutaneous Q12H   feeding supplement  1 Container Oral TID BM   leptospermum manuka honey  1 Application Topical Daily   loperamide  2 mg Oral BID   multivitamin with minerals  1 tablet Oral Daily   mouth rinse  15 mL Mouth Rinse 4 times per day   pantoprazole (PROTONIX) IV  40 mg Intravenous Q12H   potassium chloride  40 mEq Oral  TID BM   sodium bicarbonate  1,300 mg Oral TID   sodium chloride flush  10-40 mL Intracatheter Q12H      Subjective:   Frank Hernandez was seen and examined today.  Continues to have diarrhea, breathing improving today still has some wheezing.  No fevers or chills, cough.  Noted right arm swelling.   Objective:   Vitals:   08/10/22 0900 08/10/22 1000 08/10/22 1100 08/10/22 1124  BP:  (!) 132/54    Pulse: 73 68 72   Resp: (!) 23 (!) 25 20   Temp:    98.4 F (36.9 C)  TempSrc:    Oral  SpO2: 100% 99% 96%   Weight:      Height:        Intake/Output Summary (Last 24 hours) at 08/10/2022 1354 Last data filed at 08/10/2022 1012 Gross per 24 hour  Intake 1017.81 ml  Output 2437 ml  Net -1419.19 ml     Wt Readings from Last 3 Encounters:  08/07/22 101.2 kg  07/16/22 90.5 kg  07/09/22 91.9 kg   Physical Exam General: Alert and oriented x 3, NAD Cardiovascular: S1 S2 clear, RRR.  Respiratory: Mild bilateral expiratory wheezing Gastrointestinal: Soft, nontender, nondistended, NBS Ext: 1-2+ pedal edema bilaterally, 1+ UE edema  Neuro: no new deficits Psych: pleasant, normal affect  Data Reviewed:  I have personally reviewed following labs    CBC Lab Results  Component Value Date   WBC 9.3 08/10/2022   RBC 2.39 (L) 08/10/2022   HGB 7.6 (L) 08/10/2022   HCT 22.6 (L) 08/10/2022   MCV 94.6 08/10/2022   MCH 31.8 08/10/2022   PLT 180 08/10/2022   MCHC 33.6 08/10/2022   RDW 15.7 (H) 08/10/2022   LYMPHSABS 2.0 07/29/2022   MONOABS 1.0 07/29/2022   EOSABS 0.2 07/29/2022   BASOSABS 0.0 07/29/2022     Last metabolic panel Lab Results  Component Value Date   NA 141 08/10/2022   K 3.6 08/10/2022   CL 114 (H) 08/10/2022   CO2 19 (L) 08/10/2022   BUN <5 (L) 08/10/2022   CREATININE 0.98 08/10/2022   GLUCOSE 93 08/10/2022   GFRNONAA >60 08/10/2022   GFRAA >60 12/27/2015   CALCIUM 6.9 (L) 08/10/2022   PHOS 3.3 08/10/2022   PROT 4.3 (L) 08/06/2022   ALBUMIN 2.5  (L) 08/10/2022   LABGLOB 2.3 07/16/2022   AGRATIO 0.9 04/14/2022   BILITOT 0.4 08/06/2022   ALKPHOS 66 08/06/2022   AST 25 08/06/2022   ALT 5 08/06/2022   ANIONGAP 8 08/10/2022    CBG (last 3)  Recent Labs    08/09/22 2349 08/10/22 0555 08/10/22 1123  GLUCAP 118* 82 102*      Coagulation Profile: No results for input(s): "INR", "PROTIME" in the last 168 hours.   Radiology Studies: I have personally reviewed the imaging studies  VAS [place] UPPER EXTREMITY VENOUS DUPLEX  Result Date: 08/10/2022 UPPER VENOUS STUDY  Patient Name:  Frank Hernandez  Date of Exam:   08/10/2022 Medical Rec #: [id number]            Accession #:    [id number] Date of Birth: 1940-07-25             Patient Gender: M Patient Age:   10 years Exam Location:  [facility] Procedure:      VAS [place] UPPER EXTREMITY VENOUS DUPLEX Referring Phys: Leonela Kivi --------------------------------------------------------------------------------  Indications: Edema Risk Factors: None identified. Limitations: Poor ultrasound/tissue interface, line and bandages. Comparison Study: No prior studies. Performing Technologist: Chanda Busing RVT  Examination Guidelines: A complete evaluation includes  B-mode imaging, spectral Doppler, color Doppler, and power Doppler as needed of all accessible portions of each vessel. Bilateral testing is considered an integral part of a complete examination. Limited examinations for reoccurring indications may be performed as noted.  Right Findings: +----------+------------+---------+-----------+----------+-------+ RIGHT     CompressiblePhasicitySpontaneousPropertiesSummary +----------+------------+---------+-----------+----------+-------+ IJV           Full       Yes       Yes                      +----------+------------+---------+-----------+----------+-------+ Subclavian    Full       Yes       Yes                       +----------+------------+---------+-----------+----------+-------+ Axillary      Full       Yes       Yes                      +----------+------------+---------+-----------+----------+-------+ Brachial      Full                                          +----------+------------+---------+-----------+----------+-------+ Radial        Full                                          +----------+------------+---------+-----------+----------+-------+ Ulnar         Full                                          +----------+------------+---------+-----------+----------+-------+ Cephalic      Full                                          +----------+------------+---------+-----------+----------+-------+ Basilic       Full                                          +----------+------------+---------+-----------+----------+-------+  Left Findings: +----------+------------+---------+-----------+----------+-------+ LEFT      CompressiblePhasicitySpontaneousPropertiesSummary +----------+------------+---------+-----------+----------+-------+ Subclavian    Full       Yes       Yes                      +----------+------------+---------+-----------+----------+-------+  Summary:  Right: No evidence of deep vein thrombosis in the upper extremity. No evidence of superficial vein thrombosis in the upper extremity.  Left: No evidence of thrombosis in the subclavian.  *See table(s) above for measurements and observations.    Preliminary    DG CHEST PORT 1 VIEW  Result Date: 08/09/2022 CLINICAL DATA:  Wheezing noted after sigmoidoscopy procedure today. Dyspnea. EXAM: PORTABLE CHEST 1 VIEW COMPARISON:  08/08/2022 FINDINGS: Shallow inspiration. Heart size and pulmonary vascularity are normal for technique. Right PICC line with tip over the low SVC region. No pneumothorax. Interval development of a focal area of consolidation in the left lower lung. This  may represent pneumonia,  aspiration, or atelectasis. Right lung is clear. No pleural effusions. Calcified and tortuous aorta. Degenerative changes in the spine and shoulders. IMPRESSION: New left lower lung consolidation. Electronically Signed   By: Burman Nieves M.D.   On: 08/09/2022 18:15       Makyra Corprew M.D. [facility] 08/10/2022, 1:54 PM  Available via Epic secure chat 7am-7pm After 7 pm, please refer to night coverage provider listed on amion.

## 2022-08-10 NOTE — Progress Notes (Signed)
[facility] Progress Note  Subjective: labs show K+ today, pt is taking about 2 of the 4-5 KCl doses that were suggested  Vitals:   08/10/22 1200 08/10/22 1300 08/10/22 1400 08/10/22 1519  BP: (!) 120/105  (!) 128/55   Pulse: (!) 52 71 72   Resp: (!) 29 19 (!) 23   Temp:    98.5 F (36.9 C)  TempSrc:    Oral  SpO2: 91% 100% (!) 80%   Weight:      Height:        Exam: Gen alert, no distress, obese AAM, on RA No rash, cyanosis or gangrene Sclera anicteric, throat clear  No jvd or bruits Chest clear bilat to bases, no rales/ wheezing RRR no RG Abd obese soft ntnd no mass or ascites +bs GU normal male MS no joint effusions or deformity Ext 1-2+ bilat LE edema pretib, 1+ bilat UE edema Neuro is alert, Ox 3 , nf, very deconditioned        Home meds include - zovirax, asa, ensure plus, klorcon, imodium, vit D2, cardura 2 mg qd, remeron, zofran prn, compazine prn, prns/ vits/ supps     CXR 6/09 - no active disease   I/O total = 23 L in and 22 L out = + 1 L    Alb 2.1     Creat 1.83 admit --> 1.11 on 6/02, creat has been normal since then 0.87- 1.10      Vit D = 32     1,25 vit D  and  PTH pend     Assessment/ Plan: Hypokalemia - 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 K+ up to the high 3's w/ a bit higher KCl po dosing. Would continue 40mg  Eq 2-3 x per day until K+ is mid 4's then slowly back down but would not stop his KCl supplements. No further suggestions, will sign off.  Hypocalcemia - hx vit D deficiency yrs ago. Vit D in 30s, pth/ calcitriol pending. Cont prn IV Ca gluc. Started CaCO3 at 800mg  elemental tid. Alb is ~2, so goal for unadjusted low end of normal Ca++ would be Ca++ > 7.3.  Would cont CaCO3 dosing somewhere around 400- 800mg  2-3 x per day for now.  Acute L DVT w/ LLL PE - getting lovenox , per pmd Pseudo-obstruction/ Ogilvie's syndrome/ colonic distension - GI to do flex sig  when electrolytes are better.  Diarrhea - ongoing for several months, Cdif negative. Was rx'd for +Ecoli on GI panel w/ po abx for 3 days.  NAG metabolic acidosis - getting po sod bicarb, goal serum CO2 18-20 or higher.  Hypomagnesemia - cont to replace IV / PO as needed Hypophosphatemia - cont to replace IV/ PO  as needed Multiple myeloma - dx'd earlier this year, off chemo for now as not tolerating.      Vinson Moselle MD [facility] 08/10/2022, 4:20 PM  Recent Labs  Lab 08/06/22 0259 08/07/22 0724 08/09/22 0527 08/10/22 0531  HGB 8.1*   < > 7.5* 7.6*  ALBUMIN 2.1*  --   --  2.5*  CALCIUM 5.9*   < > 6.5* 6.9*  PHOS 2.3*   < > 2.1* 3.3  CREATININE 0.87   < > 0.90 0.98  K 3.0*   < > 3.8 3.6   < > = values in this interval not displayed.    No results for input(s): "IRON", "TIBC", "FERRITIN" in the last  168 hours. Inpatient medications:  acyclovir  400 mg Oral QHS   budesonide (PULMICORT) nebulizer solution  0.25 mg Nebulization BID   calcium carbonate  800 mg of elemental calcium Oral TID   Chlorhexidine Gluconate Cloth  6 each Topical Daily   cholestyramine light  4 g Oral BID   enoxaparin (LOVENOX) injection  100 mg Subcutaneous Q12H   feeding supplement  1 Container Oral TID BM   leptospermum manuka honey  1 Application Topical Daily   loperamide  2 mg Oral BID   multivitamin with minerals  1 tablet Oral Daily   mouth rinse  15 mL Mouth Rinse 4 times per day   pantoprazole (PROTONIX) IV  40 mg Intravenous Q12H   potassium chloride  40 mEq Oral TID BM   sodium bicarbonate  1,300 mg Oral TID   sodium chloride flush  10-40 mL Intracatheter Q12H    ampicillin-sulbactam (UNASYN) IV Stopped (08/10/22 1425)   calcium gluconate Stopped (08/10/22 1114)   acetaminophen **OR** acetaminophen, HYDROmorphone (DILAUDID) injection, ipratropium-albuterol, loperamide, metoprolol tartrate, mouth rinse, pneumococcal 20-valent conjugate vaccine, prochlorperazine, sodium chloride flush, zinc  oxide

## 2022-08-10 NOTE — Progress Notes (Signed)
Right upper extremity venous duplex has been completed. Preliminary results can be found in CV Proc through chart review.   08/10/22 9:31 AM Olen Cordial RVT

## 2022-08-10 NOTE — TOC Progression Note (Signed)
Transition of Care [facility]) - Progression Note    Patient Details  Name: Frank Hernandez MRN: [id number] Date of Birth: 16-Aug-1940  [facility] CM/SW Contact  Amada Jupiter, LCSW Phone Number: 08/10/2022, 12:52 PM  Clinical Narrative:     Met with pt and wife today to touch base and remind that [facility] will continue to follow for dc planning needs.  They understand that it is possible pt may need SNF for rehab, but, wife hopeful he will continue to progress and be able to dc home.  Wife reports that she and their adult son are in the home and able to assist.    Expected Discharge Plan: Home/Self Care Barriers to Discharge: Continued Medical Work up  Expected Discharge Plan and Services   Discharge Planning Services: CM Consult   Living arrangements for the past 2 months: Single Family Home                                       Social Determinants of Health (SDOH) Interventions SDOH Screenings   Food Insecurity: No Food Insecurity (07/30/2022)  Recent Concern: Food Insecurity - Food Insecurity Present (05/28/2022)  Housing: Low Risk  (07/30/2022)  Transportation Needs: No Transportation Needs (07/30/2022)  Recent Concern: Transportation Needs - Unmet Transportation Needs (05/28/2022)  Utilities: Not At Risk (07/30/2022)  Depression (PHQ2-9): Low Risk  (04/29/2022)  Tobacco Use: Low Risk  (08/09/2022)    Readmission Risk Interventions    08/03/2022    8:39 AM 04/14/2022    1:48 PM 04/02/2022   11:43 AM  Readmission Risk Prevention Plan  Post Dischage Appt   Complete  Medication Screening   Complete  Transportation Screening Complete Complete Complete  PCP or Specialist Appt within 5-7 Days  Complete   Home Care Screening  Complete   Medication Review (RN CM)  Complete   Medication Review (RN Care Manager) Complete    HRI or Home Care Consult Complete    SW Recovery Care/Counseling Consult Complete    Palliative Care Screening Not Applicable    Skilled Nursing  Facility Complete

## 2022-08-11 ENCOUNTER — Encounter (HOSPITAL_COMMUNITY): Payer: Self-pay | Admitting: Internal Medicine

## 2022-08-11 DIAGNOSIS — R197 Diarrhea, unspecified: Secondary | ICD-10-CM | POA: Diagnosis not present

## 2022-08-11 DIAGNOSIS — K56609 Unspecified intestinal obstruction, unspecified as to partial versus complete obstruction: Secondary | ICD-10-CM | POA: Diagnosis not present

## 2022-08-11 LAB — BASIC METABOLIC PANEL
Anion gap: 8 (ref 5–15)
BUN: <5 mg/dL — ABNORMAL LOW (ref 8–23)
CO2: 20 mmol/L — ABNORMAL LOW (ref 22–32)
Calcium: 7.2 mg/dL — ABNORMAL LOW (ref 8.9–10.3)
Chloride: 111 mmol/L (ref 98–111)
Creatinine, Ser: 0.94 mg/dL (ref 0.61–1.24)
GFR, Estimated: >60 mL/min (ref >60)
Glucose, Bld: 75 mg/dL (ref 70–99)
Potassium: 3.1 mmol/L — ABNORMAL LOW (ref 3.5–5.1)
Sodium: 139 mmol/L (ref 135–145)

## 2022-08-11 LAB — GLUCOSE, CAPILLARY
Glucose-Capillary: 63 mg/dL — ABNORMAL LOW (ref 70–99)
Glucose-Capillary: 73 mg/dL (ref 70–99)
Glucose-Capillary: 80 mg/dL (ref 70–99)
Glucose-Capillary: 94 mg/dL (ref 70–99)

## 2022-08-11 LAB — MAGNESIUM: Magnesium: 1.7 mg/dL (ref 1.7–2.4)

## 2022-08-11 LAB — PHOSPHORUS: Phosphorus: 2.3 mg/dL — ABNORMAL LOW (ref 2.5–4.6)

## 2022-08-11 MED ORDER — LOPERAMIDE HCL 2 MG PO CAPS
2.0000 mg | ORAL_CAPSULE | Freq: Two times a day (BID) | ORAL | Status: DC
  Administered 2022-08-11 – 2022-08-16 (×10): 2 mg via ORAL
  Filled 2022-08-11 (×10): qty 1

## 2022-08-11 MED ORDER — FUROSEMIDE 10 MG/ML IJ SOLN
20.0000 mg | Freq: Once | INTRAMUSCULAR | Status: AC
  Administered 2022-08-11: 20 mg via INTRAVENOUS
  Filled 2022-08-11: qty 2

## 2022-08-11 MED ORDER — CALCIUM GLUCONATE-NACL 1-0.675 GM/50ML-% IV SOLN: 1.0000 g | Freq: Every day | INTRAVENOUS | Status: DC | PRN

## 2022-08-11 MED ORDER — MAGNESIUM SULFATE 2 GM/50ML IV SOLN
2.0000 g | Freq: Once | INTRAVENOUS | Status: AC
  Administered 2022-08-11: 2 g via INTRAVENOUS
  Filled 2022-08-11: qty 50

## 2022-08-11 MED ORDER — POTASSIUM PHOSPHATES 15 MMOLE/5ML IV SOLN
15.0000 mmol | Freq: Once | INTRAVENOUS | Status: AC
  Administered 2022-08-11: 15 mmol via INTRAVENOUS
  Filled 2022-08-11: qty 5

## 2022-08-11 MED ORDER — RISAQUAD PO CAPS
2.0000 | ORAL_CAPSULE | Freq: Every day | ORAL | Status: DC
  Administered 2022-08-11 – 2022-08-16 (×6): 2 via ORAL
  Filled 2022-08-11 (×6): qty 2

## 2022-08-11 NOTE — Progress Notes (Addendum)
Daily Progress Note  DOA: 07/29/2022 Hospital Day: 14 Chief Complaint:    Assessment and Plan:    Brief Narrative:  Frank Hernandez is a 82 y.o. year old male whose past medical history includes, but isn't necessarily limited to, multiple myeloma currently receiving treatment, CKD stage III. Admitted with PE, lower extremity cellulitis, diarrhea and a dilated colon with air fluid levels. Seen in consult by GI 07/31/22  Pseudoobstruction , resolved.  Profuse diarrhea with electrolyte abnormalities  -Initially thought to possibly be 2/2 to daratumumab but also stool studies + for E.Coli. Treated with Azithromycin without improvement.  C-diff negative x 2.  Oncology wondering about amyloidosis of bowel.   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 However, he did undergo a flex sigmoidoscopy on 6/10. There was scattered areas of congested mucosa in the proximal sigmoid colon, descending colon, and  transverse colon. Biopsies showed only hyperplastic change Plan:  -Stool output a little less yesterday ( 788 ml). Continue BID cholestyramine and BID immodium.   PE, on Lovenox  Prolonged QT / electrolyte abnormalities  Buckhall anemia / multiple myeloma.  Hgb stable  ------------------------------------------------------------------------------------------------------------    [facility] Attending   I have taken an interval history, reviewed the chart and examined the patient. I agree with the Advanced Practitioner's note, impression and recommendations and the following additions are made  Will advance to soft diet  Colon biopsies unrevealing "hyperplastic change" - stains to look for amyloidosis not done that I can tell - need to contact pathology tomorrow and ask that they do those  Iva Boop, MD, [facility] See Loretha Stapler on call - gastroenterology for best contact person 08/11/2022 4:50 PM   Subjective / New Events:   No abdominal  pain. No nausea. Feels okay.   Objective:   Recent Labs    08/08/22 1242 08/09/22 0527 08/10/22 0531  WBC 4.3 4.1 9.3  HGB 7.7* 7.5* 7.6*  HCT 23.0* 22.4* 22.6*  PLT 183 162 180   BMET Recent Labs    08/09/22 0527 08/10/22 0531 08/11/22 0541  NA 140 141 139  K 3.8 3.6 3.1*  CL 112* 114* 111  CO2 20* 19* 20*  GLUCOSE 91 93 75  BUN <5* <5* <5*  CREATININE 0.90 0.98 0.94  CALCIUM 6.5* 6.9* 7.2*   LFT Recent Labs    08/10/22 0531  ALBUMIN 2.5*   PT/INR No results for input(s): "LABPROT", "INR" in the last 72 hours.   Imaging:  VAS [place] UPPER EXTREMITY VENOUS DUPLEX UPPER VENOUS STUDY    Patient Name:  Frank Hernandez  Date of Exam:   08/10/2022 Medical Rec #: [id number]            Accession #:    [id number] Date of Birth: [birth year]             Patient Gender: M Patient Age:   64 years Exam Location:  [facility] Procedure:      VAS [place] UPPER EXTREMITY VENOUS DUPLEX Referring Phys: RIPUDEEP RAI  --------------------------------------------------------------------------------   Indications: Edema Risk Factors: None identified. Limitations: Poor ultrasound/tissue interface, line and bandages. Comparison Study: No prior studies.  Performing Technologist: Chanda Busing RVT    Examination Guidelines: A complete evaluation includes B-mode imaging, spectral Doppler, color Doppler, and power Doppler as needed of all accessible portions of each vessel. Bilateral testing is considered an integral part of a complete examination. Limited examinations for reoccurring indications may be performed  as noted.    Right Findings: +----------+------------+---------+-----------+----------+-------+ RIGHT     CompressiblePhasicitySpontaneousPropertiesSummary +----------+------------+---------+-----------+----------+-------+ IJV           Full       Yes       Yes                       +----------+------------+---------+-----------+----------+-------+ Subclavian    Full       Yes       Yes                      +----------+------------+---------+-----------+----------+-------+ Axillary      Full       Yes       Yes                      +----------+------------+---------+-----------+----------+-------+ Brachial      Full                                          +----------+------------+---------+-----------+----------+-------+ Radial        Full                                          +----------+------------+---------+-----------+----------+-------+ Ulnar         Full                                          +----------+------------+---------+-----------+----------+-------+ Cephalic      Full                                          +----------+------------+---------+-----------+----------+-------+ Basilic       Full                                          +----------+------------+---------+-----------+----------+-------+    Left Findings: +----------+------------+---------+-----------+----------+-------+ LEFT      CompressiblePhasicitySpontaneousPropertiesSummary +----------+------------+---------+-----------+----------+-------+ Subclavian    Full       Yes       Yes                      +----------+------------+---------+-----------+----------+-------+    Summary:   Right: No evidence of deep vein thrombosis in the upper extremity. No evidence of superficial vein thrombosis in the upper extremity.   Left: No evidence of thrombosis in the subclavian.    *See table(s) above for measurements and observations.   Diagnosing physician: Coral Else MD Electronically signed by Coral Else MD on 08/10/2022 at 5:49:11 PM.      Final       Scheduled inpatient medications:   acidophilus  2 capsule Oral Daily   acyclovir  400 mg Oral QHS   budesonide (PULMICORT) nebulizer solution  0.25 mg  Nebulization BID   calcium carbonate  800 mg of elemental calcium Oral TID   Chlorhexidine Gluconate Cloth  6 each Topical Daily   cholestyramine light  4 g Oral BID   enoxaparin (LOVENOX) injection  100  mg Subcutaneous Q12H   feeding supplement  1 Container Oral TID BM   leptospermum manuka honey  1 Application Topical Daily   loperamide  2 mg Oral BID   multivitamin with minerals  1 tablet Oral Daily   mouth rinse  15 mL Mouth Rinse 4 times per day   pantoprazole (PROTONIX) IV  40 mg Intravenous Q12H   potassium chloride  40 mEq Oral TID BM   sodium bicarbonate  1,300 mg Oral TID   sodium chloride flush  10-40 mL Intracatheter Q12H   Continuous inpatient infusions:   ampicillin-sulbactam (UNASYN) IV 3 g (08/11/22 0941)   calcium gluconate Stopped (08/10/22 2230)   magnesium sulfate bolus IVPB     potassium PHOSPHATE IVPB (in mmol)     PRN inpatient medications: acetaminophen **OR** acetaminophen, HYDROmorphone (DILAUDID) injection, ipratropium-albuterol, loperamide, metoprolol tartrate, mouth rinse, pneumococcal 20-valent conjugate vaccine, prochlorperazine, sodium chloride flush, zinc oxide  Vital signs in last 24 hours: Temp:  [98.1 F (36.7 C)-99.3 F (37.4 C)] 98.4 F (36.9 C) (06/12 0820) Pulse Rate:  [52-82] 64 (06/12 0600) Resp:  [16-29] 20 (06/12 0600) BP: (120-149)/(53-105) 149/71 (06/12 0600) SpO2:  [80 %-100 %] 100 % (06/12 0808) Last BM Date : 08/10/22  Intake/Output Summary (Last 24 hours) at 08/11/2022 1058 Last data filed at 08/11/2022 0600 Gross per 24 hour  Intake 1242.83 ml  Output 2543 ml  Net -1300.17 ml    Intake/Output from previous day: 06/11 0701 - 06/12 0700 In: 1262.8 [P.O.:720; I.V.:20; IV Piggyback:522.8] Out: 2588 [Urine:1800; Stool:788] Intake/Output this shift: No intake/output data recorded.   Physical Exam:  General: Alert male in NAD Heart:  Regular rate and rhythm.  Pulmonary: Normal respiratory effort Abdomen: Soft,  nondistended, nontender. Normal bowel sounds. Neurologic: Alert and oriented Psych: Pleasant. Cooperative.   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

## 2022-08-11 NOTE — Progress Notes (Signed)
Physical Therapy Treatment Patient Details Name: Frank Hernandez MRN: [id number] DOB: 1940/12/30 Today's Date: 08/11/2022   History of Present Illness Pt is 82 year old gentleman admitted on 07/29/22 with respiratory symptoms, found to have new pulmonary embolism and L LE DVT, also with abdominal distention for several days and several months of diarrhea with incontinence, found to have significantly dilated colon (per GI, Colonic distention, suspect Ogilvie's/pseudoobstruction).  Pt with history of remote bladder cancer, pt underwent IM nail R femur d/t pathological fx +Bx on 04/17/22 hospitalized diagnosed with multiple myeloma, CKD, hypertension recently started on treatment for multiple myeloma and admitted 07/29/22    PT Comments    Pt had improved EOB balance and bed mobility but was not able to ambulate as far.  Did use RW today instead of EVA walker , pt had better endurance with EVA.  Does still need assist of 2 for safety.  Continue to progress as able.    Recommendations for follow up therapy are one component of a multi-disciplinary discharge planning process, led by the attending physician.  Recommendations may be updated based on patient status, additional functional criteria and insurance authorization.  Follow Up Recommendations  Can patient physically be transported by private vehicle: No    Assistance Recommended at Discharge Frequent or constant Supervision/Assistance  Patient can return home with the following A lot of help with walking and/or transfers;A lot of help with bathing/dressing/bathroom;Assistance with cooking/housework;Assist for transportation;Help with stairs or ramp for entrance   Equipment Recommendations  None recommended by PT    Recommendations for Other Services       Precautions / Restrictions Precautions Precautions: Fall Precaution Comments: Flexi Seal     Mobility  Bed Mobility Overal bed mobility: Needs Assistance Bed Mobility: Supine  to Sit, Sit to Supine     Supine to sit: Mod assist     General bed mobility comments: Provided cues for sequencing .  Pt able to get legs off bed then mod A to lift trunk    Transfers Overall transfer level: Needs assistance Equipment used: Rolling walker (2 wheels) Transfers: Sit to/from Stand Sit to Stand: Mod assist, +2 physical assistance           General transfer comment: Min A x 2 from elevated bed x 1 and mod A x 2 from chair x 2; cues for hand placement    Ambulation/Gait Ambulation/Gait assistance: +2 safety/equipment, Min assist Gait Distance (Feet): 8 Feet (8' then 3') Assistive device: Rolling walker (2 wheels) Gait Pattern/deviations: Step-to pattern, Decreased stride length, Shuffle Gait velocity: decreased     General Gait Details: Reports R LE painful (recent sx)- educated step to R pattern; chair follow; min A for balance and RW; fatigued easily   Stairs             Wheelchair Mobility    Modified Rankin (Stroke Patients Only)       Balance Overall balance assessment: Needs assistance Sitting-balance support: Feet supported, No upper extremity supported Sitting balance-Leahy Scale: Fair Sitting balance - Comments: could sit without assist   Standing balance support: Bilateral upper extremity supported, Reliant on assistive device for balance, During functional activity Standing balance-Leahy Scale: Poor Standing balance comment: Requiring RW                            Cognition Arousal/Alertness: Awake/alert Behavior During Therapy: WFL for tasks assessed/performed Overall Cognitive Status: Within Functional Limits for tasks assessed  General 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  Following all directions.  Feeling "better".  Motivated.        Exercises General Exercises - Lower Extremity Ankle Circles/Pumps: AROM, Both, 10 reps, Supine Quad Sets: AROM, Supine,  Both, 10 reps Heel Slides: AAROM, Both, 10 reps, Supine Hip ABduction/ADduction: AAROM, Both, 10 reps, Supine    General Comments General comments (skin integrity, edema, etc.): VSS      Pertinent Vitals/Pain Pain Assessment Pain Assessment: Faces Faces Pain Scale: Hurts a little bit Pain Location: generalized Pain Intervention(s): Limited activity within patient's tolerance, Monitored during session    Home Living                          Prior Function            PT Goals (current goals can now be found in the care plan section) Progress towards PT goals: Progressing toward goals    Frequency    Min 1X/week      PT Plan Current plan remains appropriate    Co-evaluation              AM-PAC PT "6 Clicks" Mobility   Outcome Measure  Help needed turning from your back to your side while in a flat bed without using bedrails?: A Lot Help needed moving from lying on your back to sitting on the side of a flat bed without using bedrails?: A Lot Help needed moving to and from a bed to a chair (including a wheelchair)?: A Lot Help needed standing up from a chair using your arms (e.g., wheelchair or bedside chair)?: A Lot Help needed to walk in hospital room?: A Lot Help needed climbing 3-5 steps with a railing? : Total 6 Click Score: 11    End of Session Equipment Utilized During Treatment: Gait belt Activity Tolerance: Patient tolerated treatment well Patient left: with chair alarm set;in chair;with call bell/phone within reach Nurse Communication: Mobility status PT Visit Diagnosis: Difficulty in walking, not elsewhere classified (R26.2);Muscle weakness (generalized) (M62.81)     Time: 6295-2841 PT Time Calculation (min) (ACUTE ONLY): 28 min  Charges:  $Gait Training: 8-22 mins $Therapeutic Activity: 8-22 mins                     Anise Salvo, PT Acute Rehab [facility] [phone]    Rayetta Humphrey 08/11/2022, 1:25 PM

## 2022-08-11 NOTE — Progress Notes (Signed)
[facility]                                                                              Frank Hernandez, is a 82 y.o. male, DOB - [birth year], ZOX:[id number] Admit date - 07/29/2022    Outpatient Primary MD for the patient is Frank Hernandez (Inactive)  LOS - 13  days  No chief complaint on file.      Brief summary   Patient is a 82 year old with hypertension, prediabetes, stage IIIa CKD, pathological fracture of the right femoral, multiple myeloma presented to ED with wheezing and rhonchi. Patient has been taking antibiotics for cellulitis.  He has had worsening of his right foot wound.  He reported abdominal distention with watery stool.    Chest x-ray showed no active cardiopulmonary disease.  CTA chest showed left lower lobe segmental PE with minimal clot burden. CT abdomen with dilated colon with air-fluid level seen and scattered colonic diverticula.  Transition seen in the area of the new left inguinal hernia involving the sigmoid colon.  No pneumatosis.  Small bowel and stomach are known dilated but there is an enteric tube.  Separate large known right inguinal hernia involving the small bowel and significant portion of the urinary bladder.  Lytic bone lesion consistent with known history of myeloma.   Patient was admitted with PE and a small bowel obstruction.  Surgery and GI consulted. 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    Assessment & Plan     Acute left lower lobe segmental pulmonary embolism,  acute DVT left femoral vein -CTA chest showed left lower lobe segmental PE, minimal clot burden -Venous Doppler consistent with acute DVT involving left common femoral vein, indeterminate age DVT. --2D echo showed normal RV function -Lovenox   SBO; Pseudo-obstruction Ogilvie Syndrome, colonic distention: -Presents with obstructive symptoms, CT abdomen pelvis showed Dilated colon with air-fluid level seen and  scattered colonic diverticula. Transition seen in the area of the new left inguinal hernia involving the sigmoid colon. No pneumatosis, free air or fluid. Small bowel and stomach are nondilated -NG tube  has been removed by General Surgery.  -Per GI: Ogilvie's/Pseudo-obstruction.  -diet advanced to full liquids   Diarrhea; - C diff negative. Having issues with Diarrhea since last hospitalization.  -GI pathogen panel positive for enteropathogenic E coli, completed azithromycin for 3 days.  -Thought to be related to Chemo: Daratumumab however seen by oncology, Dr. 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  Aspiration -On 6/10, patient noted to be short of breath with wheezing -BNP 671.3, procalcitonin 0.22, chest x-ray showed new left lower lung consolidation -Received Lasix 40 mg IV x 1 with significant output, KVO IV fluids -Placed on Unasyn IV -Venous Doppler upper extremity, negative for  DVT RUE   NAG Metabolic Acidosis: in setting of GI losses, profound electrolyte abnormalities --Nephrology consulted and signed off  Hypomagnesemia, hypokalemia -Placed on aggressive replacement   Hypophosphatemia -Na-Phos x1 IV today  Hypocalcemia -Placed on twice daily calcium replacement -Vitamin D 34.7, calcitriol, PTH 549  Cellulitis of both lower extremity: Completed 5 days Ancef.    Prolonged QT   Replete electrolytes    Hypertension:  -BP currently stable   Pre-Diabetes: - diet controlled.    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 -Continue as needed Tylenol   Multiple myeloma -Oncology following   Pressure Injury Documentation:    Pressure Injury 08/08/22 Buttocks Right;Left Deep Tissue Pressure Injury - Purple or maroon localized area of discolored intact skin or blood-filled blister due to damage of underlying soft tissue from pressure and/or shear. (Active)  08/08/22 1241  Location: Buttocks  Location Orientation: Right;Left  Staging: Deep Tissue Pressure Injury - Purple or maroon localized area of discolored intact skin or blood-filled blister due to damage of underlying soft tissue from pressure and/or shear.  Wound Description (Comments):   Present on Admission:   Dressing Type Foam - Lift dressing to assess site every shift 08/10/22 1955   Mild to moderate protein calorie malnutrition, hypoalbuminemia Nutrition Problem: Inadequate oral intake Etiology: acute illness (small bowel obstruction) Signs/Symptoms: energy intake < 75% for > 7 days Interventions: Refer to RD note for recommendations, Boost Breeze, MVI Estimated body mass index is 32.95 kg/m as calculated from the following:   Height as of this encounter: 5\' 9"  (1.753 m).   Weight as of this encounter: 101.2 kg.  Code Status: Full code DVT Prophylaxis:  Lovenox  Level of Care: Level of care: Telemetry Family Communication: Updated patient/family on phone Disposition Plan:      Remains inpatient appropriate:        Consultants:   General surgery GI Nephrology     Medications  acidophilus  2 capsule Oral Daily   acyclovir  400 mg Oral QHS   budesonide (PULMICORT) nebulizer solution  0.25 mg Nebulization BID   calcium carbonate  800 mg of elemental calcium Oral TID   Chlorhexidine Gluconate Cloth  6 each Topical Daily    cholestyramine light  4 g Oral BID   enoxaparin (LOVENOX) injection  100 mg Subcutaneous Q12H   feeding supplement  1 Container Oral TID BM   leptospermum manuka honey  1 Application Topical Daily   loperamide  2 mg Oral BID   multivitamin with minerals  1 tablet Oral Daily   mouth rinse  15 mL Mouth Rinse 4 times per day   pantoprazole (PROTONIX) IV  40 mg Intravenous Q12H   potassium chloride  40 mEq Oral TID BM   sodium bicarbonate  1,300 mg Oral TID   sodium chloride flush  10-40 mL Intracatheter Q12H      Subjective:   No overnight events, feeling well    Objective:   Vitals:   08/11/22 0400 08/11/22 0600 08/11/22 0808 08/11/22 0820  BP: (!) 125/53 (!) 149/71    Pulse: 63 64    Resp: 17 20    Temp:    98.4 F (36.9 C)  TempSrc:    Oral  SpO2: 100% 100% 100%  Weight:      Height:        Intake/Output Summary (Last 24 hours) at 08/11/2022 1155 Last data filed at 08/11/2022 0600 Gross per 24 hour  Intake 1242.83 ml  Output 2543 ml  Net -1300.17 ml     Wt Readings from Last 3 Encounters:  08/07/22 101.2 kg  07/16/22 90.5 kg  07/09/22 91.9 kg   Physical Exam  General: Appearance:    Obese male in no acute distress     Lungs:     respirations unlabored  Heart:    Normal heart rate. Normal rhythm. No murmurs, rubs, or gallops.   MS:   All extremities are intact.   Neurologic:   Awake, alert    Data Reviewed:  I have personally reviewed following labs    CBC Lab Results  Component Value Date   WBC 9.3 08/10/2022   RBC 2.39 (L) 08/10/2022   HGB 7.6 (L) 08/10/2022   HCT 22.6 (L) 08/10/2022   MCV 94.6 08/10/2022   MCH 31.8 08/10/2022   PLT 180 08/10/2022   MCHC 33.6 08/10/2022   RDW 15.7 (H) 08/10/2022   LYMPHSABS 2.0 07/29/2022   MONOABS 1.0 07/29/2022   EOSABS 0.2 07/29/2022   BASOSABS 0.0 07/29/2022     Last metabolic panel Lab Results  Component Value Date   NA 139 08/11/2022   K 3.1 (L) 08/11/2022   CL 111 08/11/2022   CO2 20 (L)  08/11/2022   BUN <5 (L) 08/11/2022   CREATININE 0.94 08/11/2022   GLUCOSE 75 08/11/2022   GFRNONAA >60 08/11/2022   GFRAA >60 12/27/2015   CALCIUM 7.2 (L) 08/11/2022   PHOS 2.3 (L) 08/11/2022   PROT 4.3 (L) 08/06/2022   ALBUMIN 2.5 (L) 08/10/2022   LABGLOB 2.3 07/16/2022   AGRATIO 0.9 04/14/2022   BILITOT 0.4 08/06/2022   ALKPHOS 66 08/06/2022   AST 25 08/06/2022   ALT 5 08/06/2022   ANIONGAP 8 08/11/2022    CBG (last 3)  Recent Labs    08/10/22 2336 08/11/22 0612 08/11/22 0635  GLUCAP 80 63* 73      Coagulation Profile: No results for input(s): "INR", "PROTIME" in the last 168 hours.   Radiology Studies: I have personally reviewed the imaging studies  VAS [place] UPPER EXTREMITY VENOUS DUPLEX  Result Date: 08/10/2022 UPPER VENOUS STUDY  Patient Name:  CAYMEN BUMANGLAG  Date of Exam:   08/10/2022 Medical Rec #: [id number]            Accession #:    [id number] Date of Birth: [birth year]             Patient Gender: M Patient Age:   68 years Exam Location:  [facility] Procedure:      VAS [place] UPPER EXTREMITY VENOUS DUPLEX Referring Phys: RIPUDEEP RAI --------------------------------------------------------------------------------  Indications: Edema Risk Factors: None identified. Limitations: Poor ultrasound/tissue interface, line and bandages. Comparison Study: No prior studies. Performing Technologist: Chanda Busing RVT  Examination Guidelines: A complete evaluation includes B-mode imaging, spectral Doppler, color Doppler, and power Doppler as needed of all accessible portions of each vessel. Bilateral testing is considered an integral part of a complete examination. Limited examinations for reoccurring indications may be performed as noted.  Right Findings: +----------+------------+---------+-----------+----------+-------+ RIGHT     CompressiblePhasicitySpontaneousPropertiesSummary +----------+------------+---------+-----------+----------+-------+ IJV           Full        Yes       Yes                      +----------+------------+---------+-----------+----------+-------+  Subclavian    Full       Yes       Yes                      +----------+------------+---------+-----------+----------+-------+ Axillary      Full       Yes       Yes                      +----------+------------+---------+-----------+----------+-------+ Brachial      Full                                          +----------+------------+---------+-----------+----------+-------+ Radial        Full                                          +----------+------------+---------+-----------+----------+-------+ Ulnar         Full                                          +----------+------------+---------+-----------+----------+-------+ Cephalic      Full                                          +----------+------------+---------+-----------+----------+-------+ Basilic       Full                                          +----------+------------+---------+-----------+----------+-------+  Left Findings: +----------+------------+---------+-----------+----------+-------+ LEFT      CompressiblePhasicitySpontaneousPropertiesSummary +----------+------------+---------+-----------+----------+-------+ Subclavian    Full       Yes       Yes                      +----------+------------+---------+-----------+----------+-------+  Summary:  Right: No evidence of deep vein thrombosis in the upper extremity. No evidence of superficial vein thrombosis in the upper extremity.  Left: No evidence of thrombosis in the subclavian.  *See table(s) above for measurements and observations.  Diagnosing physician: Coral Else MD Electronically signed by Coral Else MD on 08/10/2022 at 5:49:11 PM.    Final    DG CHEST PORT 1 VIEW  Result Date: 08/09/2022 CLINICAL DATA:  Wheezing noted after sigmoidoscopy procedure today. Dyspnea. EXAM: PORTABLE CHEST 1 VIEW COMPARISON:   08/08/2022 FINDINGS: Shallow inspiration. Heart size and pulmonary vascularity are normal for technique. Right PICC line with tip over the low SVC region. No pneumothorax. Interval development of a focal area of consolidation in the left lower lung. This may represent pneumonia, aspiration, or atelectasis. Right lung is clear. No pleural effusions. Calcified and tortuous aorta. Degenerative changes in the spine and shoulders. IMPRESSION: New left lower lung consolidation. Electronically Signed   By: Burman Nieves M.D.   On: 08/09/2022 18:15       Joseph Art DO [facility] 08/11/2022, 11:55 AM  Available via Epic secure chat 7am-7pm After 7 pm, please refer to night coverage provider listed on amion.

## 2022-08-12 ENCOUNTER — Encounter (HOSPITAL_COMMUNITY): Payer: Self-pay | Admitting: Gastroenterology

## 2022-08-12 DIAGNOSIS — K56609 Unspecified intestinal obstruction, unspecified as to partial versus complete obstruction: Secondary | ICD-10-CM | POA: Diagnosis not present

## 2022-08-12 DIAGNOSIS — R0600 Dyspnea, unspecified: Secondary | ICD-10-CM | POA: Diagnosis not present

## 2022-08-12 DIAGNOSIS — I2693 Single subsegmental pulmonary embolism without acute cor pulmonale: Secondary | ICD-10-CM | POA: Diagnosis not present

## 2022-08-12 DIAGNOSIS — R197 Diarrhea, unspecified: Secondary | ICD-10-CM | POA: Diagnosis not present

## 2022-08-12 LAB — GLUCOSE, CAPILLARY
Glucose-Capillary: 59 mg/dL — ABNORMAL LOW (ref 70–99)
Glucose-Capillary: 67 mg/dL — ABNORMAL LOW (ref 70–99)
Glucose-Capillary: 71 mg/dL (ref 70–99)
Glucose-Capillary: 76 mg/dL (ref 70–99)
Glucose-Capillary: 98 mg/dL (ref 70–99)
Glucose-Capillary: 99 mg/dL (ref 70–99)

## 2022-08-12 LAB — CBC
HCT: 21.3 % — ABNORMAL LOW (ref 39.0–52.0)
Hemoglobin: 7 g/dL — ABNORMAL LOW (ref 13.0–17.0)
MCH: 31.3 pg (ref 26.0–34.0)
MCHC: 32.9 g/dL (ref 30.0–36.0)
MCV: 95.1 fL (ref 80.0–100.0)
Platelets: 199 10*3/uL (ref 150–400)
RBC: 2.24 MIL/uL — ABNORMAL LOW (ref 4.22–5.81)
RDW: 15.6 % — ABNORMAL HIGH (ref 11.5–15.5)
WBC: 4.9 10*3/uL (ref 4.0–10.5)
nRBC: 0 % (ref 0.0–0.2)

## 2022-08-12 LAB — BASIC METABOLIC PANEL
Anion gap: 7 (ref 5–15)
BUN: <5 mg/dL — ABNORMAL LOW (ref 8–23)
CO2: 20 mmol/L — ABNORMAL LOW (ref 22–32)
Calcium: 7.3 mg/dL — ABNORMAL LOW (ref 8.9–10.3)
Chloride: 115 mmol/L — ABNORMAL HIGH (ref 98–111)
Creatinine, Ser: 1.05 mg/dL (ref 0.61–1.24)
GFR, Estimated: >60 mL/min (ref >60)
Glucose, Bld: 77 mg/dL (ref 70–99)
Potassium: 4 mmol/L (ref 3.5–5.1)
Sodium: 142 mmol/L (ref 135–145)

## 2022-08-12 LAB — MAGNESIUM: Magnesium: 1.7 mg/dL (ref 1.7–2.4)

## 2022-08-12 LAB — PHOSPHORUS: Phosphorus: 2.3 mg/dL — ABNORMAL LOW (ref 2.5–4.6)

## 2022-08-12 MED ORDER — FUROSEMIDE 10 MG/ML IJ SOLN
20.0000 mg | Freq: Once | INTRAMUSCULAR | Status: AC
  Administered 2022-08-12: 20 mg via INTRAVENOUS
  Filled 2022-08-12: qty 2

## 2022-08-12 NOTE — TOC Progression Note (Signed)
Transition of Care [facility]) - Progression Note    Patient Details  Name: Frank Hernandez MRN: [id number] Date of Birth: [birth year]  [facility] CM/SW Contact  Otelia Santee, LCSW Phone Number: 08/12/2022, 2:01 PM  Clinical Narrative:    Spoke with pt's spouse who shares she is unable to care for pt at home with his current functioning and is agreeable to pt returning to SNF at discharge. She prefers pt to return to [facility] and understands pt will be in co-pay days for SNF.  Referral sent for SNF placement and currently awaiting response.    Expected Discharge Plan: Home/Self Care Barriers to Discharge: Continued Medical Work up  Expected Discharge Plan and Services   Discharge Planning Services: CM Consult   Living arrangements for the past 2 months: Single Family Home                                       Social Determinants of Health (SDOH) Interventions SDOH Screenings   Food Insecurity: No Food Insecurity (07/30/2022)  Recent Concern: Food Insecurity - Food Insecurity Present (05/28/2022)  Housing: Low Risk  (07/30/2022)  Transportation Needs: No Transportation Needs (07/30/2022)  Recent Concern: Transportation Needs - Unmet Transportation Needs (05/28/2022)  Utilities: Not At Risk (07/30/2022)  Depression (PHQ2-9): Low Risk  (04/29/2022)  Tobacco Use: Low Risk  (08/12/2022)    Readmission Risk Interventions    08/03/2022    8:39 AM 04/14/2022    1:48 PM 04/02/2022   11:43 AM  Readmission Risk Prevention Plan  Post Dischage Appt   Complete  Medication Screening   Complete  Transportation Screening Complete Complete Complete  PCP or Specialist Appt within 5-7 Days  Complete   Home Care Screening  Complete   Medication Review (RN CM)  Complete   Medication Review (RN Care Manager) Complete    HRI or Home Care Consult Complete    SW Recovery Care/Counseling Consult Complete    Palliative Care Screening Not Applicable    Skilled Nursing Facility  Complete

## 2022-08-12 NOTE — Progress Notes (Signed)
[facility]                                                                              Frank Hernandez, is a 82 y.o. male, DOB - 1940-05-23, ZOX:[id number] Admit date - 07/29/2022    Outpatient Primary MD for the patient is Frank Hernandez (Inactive)  LOS - 14  days  No chief complaint on file.      Brief summary   Patient is a 82 year old with hypertension, prediabetes, stage IIIa CKD, pathological fracture of the right femoral, multiple myeloma presented to ED with wheezing and rhonchi. Patient has been taking antibiotics for cellulitis.  He has had worsening of his right foot wound.  He reported abdominal distention with watery stool.    Chest x-ray showed no active cardiopulmonary disease.  CTA chest showed left lower lobe segmental PE with minimal clot burden. CT abdomen with dilated colon with air-fluid level seen and scattered colonic diverticula.  Transition seen in the area of the new left inguinal hernia involving the sigmoid colon.  No pneumatosis.  Small bowel and stomach are known dilated but there is an enteric tube.  Separate large known right inguinal hernia involving the small bowel and significant portion of the urinary bladder.  Lytic bone lesion consistent with known history of myeloma.   Patient was admitted with PE and a small bowel obstruction.  Surgery and GI consulted. 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   Slow to improve with large amounts of diarrhea causing electrolyte disturbances.  Patient does not want SNF.     Assessment & Plan     Acute left lower lobe segmental pulmonary embolism,  acute DVT left femoral vein -CTA chest showed left lower lobe segmental PE, minimal clot burden -Venous Doppler consistent with acute DVT involving left common femoral vein, indeterminate age DVT. --2D echo showed normal RV function -Lovenox -- ? Change to NOAC prior to d/c?  SBO; Pseudo-obstruction  Ogilvie Syndrome, colonic distention: -Presents with obstructive symptoms, CT abdomen pelvis showed Dilated colon with air-fluid level seen and scattered colonic diverticula. Transition seen in the area of the new left inguinal hernia involving the sigmoid colon. No pneumatosis, free air or fluid. Small bowel and stomach are nondilated -NG tube  has been removed by General Surgery.  -Per GI: Ogilvie's/Pseudo-obstruction.  -diet advanced    Diarrhea; - C diff negative. Having issues with Diarrhea since last hospitalization.  -GI pathogen panel positive for enteropathogenic E coli, completed azithromycin for 3 days.  -Thought to be related to Chemo: Daratumumab however seen by oncology, Dr. 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  Aspiration -On 6/10, patient noted to be short of breath with wheezing -BNP 671.3, procalcitonin 0.22, chest x-ray showed new left lower  lung consolidation -Received Lasix PRN -Placed on Unasyn IV -Venous Doppler upper extremity, negative for DVT RUE   NAG Metabolic Acidosis: in setting of GI losses, profound electrolyte abnormalities --Nephrology consulted and signed off  Hypomagnesemia, hypokalemia -Placed on aggressive replacement   Hypophosphatemia -replete  Hypocalcemia -Placed on twice daily calcium replacement -Vitamin D 34.7, calcitriol, PTH 549  Cellulitis of both lower extremity: Completed 5 days Ancef.    Prolonged QT   Replete electrolytes    Hypertension:  -BP currently stable   Pre-Diabetes: - diet controlled.    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 -Continue as needed Tylenol   Multiple myeloma -Oncology following   Pressure Injury Documentation:    Pressure Injury 08/08/22 Buttocks Right;Left Deep Tissue Pressure Injury - Purple or maroon localized area of discolored intact skin or blood-filled blister due to damage of underlying soft tissue from pressure and/or shear. (Active)  08/08/22 1241  Location: Buttocks  Location Orientation: Right;Left  Staging: Deep Tissue Pressure Injury - Purple or maroon localized area of discolored intact skin or blood-filled blister due to damage of underlying soft tissue from pressure and/or shear.  Wound Description (Comments):   Present on Admission:   Dressing Type Foam - Lift dressing to assess site every shift 08/10/22 1955   Mild to moderate protein calorie malnutrition, hypoalbuminemia Nutrition Problem: Inadequate oral intake Etiology: acute illness (small bowel obstruction) Signs/Symptoms: energy intake < 75% for > 7 days Interventions: Refer to RD note for recommendations, Boost Breeze, MVI Estimated body mass index is 32.95 kg/m as calculated from the following:   Height as of this encounter: 5\' 9"  (1.753 m).   Weight as of this encounter: 101.2 kg.  Code Status: Full code DVT Prophylaxis:  Lovenox  Level of Care: Level of care: Telemetry Family Communication: Updated patient/family on phone Disposition Plan:      Remains inpatient appropriate:   home in the next 48 hours?       Consultants:   General surgery GI Nephrology     Medications  acidophilus  2 capsule Oral Daily   acyclovir  400 mg Oral QHS   budesonide (PULMICORT) nebulizer solution  0.25 mg Nebulization BID    calcium carbonate  800 mg of elemental calcium Oral TID   Chlorhexidine Gluconate Cloth  6 each Topical Daily   cholestyramine light  4 g Oral BID   enoxaparin (LOVENOX) injection  100 mg Subcutaneous Q12H   feeding supplement  1 Container Oral TID BM   leptospermum manuka honey  1 Application Topical Daily   loperamide  2 mg Oral BID   multivitamin with minerals  1 tablet Oral Daily   mouth rinse  15 mL Mouth Rinse 4 times per day   pantoprazole (PROTONIX) IV  40 mg Intravenous Q12H   potassium chloride  40 mEq Oral TID BM   sodium bicarbonate  1,300 mg Oral TID   sodium chloride flush  10-40 mL Intracatheter Q12H      Subjective:   Does not want SNF-- wants to go home-- church built a ramp for him to get into his house    Objective:   Vitals:   08/11/22 1541 08/11/22 1920 08/12/22 0530 08/12/22 0756  BP: 122/75 127/70  122/79   Pulse: 67 66 63   Resp: 16 16 16    Temp: 98.5 F (36.9 C) 98.7 F (37.1 C) 98.7 F (37.1 C)   TempSrc: Oral Oral Oral   SpO2: 100% 100% 100% 99%  Weight:      Height:        Intake/Output Summary (Last 24 hours) at 08/12/2022 1140 Last data filed at 08/12/2022 0600 Gross per 24 hour  Intake 264.29 ml  Output 1050 ml  Net -785.71 ml     Wt Readings from Last 3 Encounters:  08/07/22 101.2 kg  07/16/22 90.5 kg  07/09/22 91.9 kg   Physical Exam  General: Appearance:    Obese male in no acute distress     Lungs:     respirations unlabored  Heart:    Normal heart rate. Normal rhythm. No murmurs, rubs, or gallops.   MS:   All extremities are intact.   Neurologic:   Awake, alert    Data Reviewed:  I have personally reviewed following labs    CBC Lab Results  Component Value Date   WBC 4.9 08/12/2022   RBC 2.24 (L) 08/12/2022   HGB 7.0 (L) 08/12/2022   HCT 21.3 (L) 08/12/2022   MCV 95.1 08/12/2022   MCH 31.3 08/12/2022   PLT 199 08/12/2022   MCHC 32.9 08/12/2022   RDW 15.6 (H) 08/12/2022   LYMPHSABS 2.0 07/29/2022   MONOABS  1.0 07/29/2022   EOSABS 0.2 07/29/2022   BASOSABS 0.0 07/29/2022     Last metabolic panel Lab Results  Component Value Date   NA 142 08/12/2022   K 4.0 08/12/2022   CL 115 (H) 08/12/2022   CO2 20 (L) 08/12/2022   BUN <5 (L) 08/12/2022   CREATININE 1.05 08/12/2022   GLUCOSE 77 08/12/2022   GFRNONAA >60 08/12/2022   GFRAA >60 12/27/2015   CALCIUM 7.3 (L) 08/12/2022   PHOS 2.3 (L) 08/12/2022   PROT 4.3 (L) 08/06/2022   ALBUMIN 2.5 (L) 08/10/2022   LABGLOB 2.3 07/16/2022   AGRATIO 0.9 04/14/2022   BILITOT 0.4 08/06/2022   ALKPHOS 66 08/06/2022   AST 25 08/06/2022   ALT 5 08/06/2022   ANIONGAP 7 08/12/2022    CBG (last 3)  Recent Labs    08/12/22 0000 08/12/22 0602 08/12/22 0640  GLUCAP 98 67* 99      Coagulation Profile: No results for input(s): "INR", "PROTIME" in the last 168 hours.   Radiology Studies: I have personally reviewed the imaging studies  No results found.     Joseph Art DO [facility] 08/12/2022, 11:40 AM  Available via Epic secure chat 7am-7pm After 7 pm, please refer to night coverage provider listed on amion.

## 2022-08-12 NOTE — Progress Notes (Signed)
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  Pt with history of remote bladder cancer, pt underwent IM nail R femur d/t pathological fx +Bx on 04/17/22 hospitalized diagnosed with multiple myeloma, CKD, hypertension recently started on treatment for multiple myeloma and admitted 07/29/22   OT comments  The pt was motivated to participate in the session. The session emphasized functional strengthening and increasing out of bed activity, as such is warranted to facilitate progressive ADL performance. He required increased assistance for lower body dressing/sock management seated EOB. He reported discomfort of his heel, with his nurse reporting a current pressure sore; OT educated him and his spouse on floating his heels, to promote would healing. He was assisted into standing using a RW, then he performed short distance ambulation using the RW. He was positioned in the chair at the end of the session. Patient will benefit from continued inpatient follow up therapy, <3 hours/day. Continue OT plan of care in the acute setting.     Recommendations for follow up therapy are one component of a multi-disciplinary discharge planning process, led by the attending physician.  Recommendations may be updated based on patient status, additional functional criteria and insurance authorization.    Assistance Recommended at Discharge Frequent or constant Supervision/Assistance  Patient can return home with the following  A lot of help with bathing/dressing/bathroom;Assistance with  cooking/housework;Direct supervision/assist for medications management;Assist for transportation;Help with stairs or ramp for entrance;A lot of help with walking and/or transfers   Equipment Recommendations  None recommended by OT       Precautions / Restrictions Precautions Precautions: Fall Restrictions Weight Bearing Restrictions: No Other Position/Activity Restrictions: rectal tube       Mobility Bed Mobility Overal bed mobility: Needs Assistance Bed Mobility: Supine to Sit     Supine to sit: Min assist, HOB elevated     General bed mobility comments: he required increased time and effort, as well as verbal cues for use of bed rail and advancing BLE off the bed    Transfers Overall transfer level: Needs assistance Equipment used: Rolling walker (2 wheels) Transfers: Sit to/from Stand Sit to Stand: Mod assist           General transfer comment: he required verbal cues to push up from the transfer surface and to demo trunk extension once in standing         ADL either performed or assessed with clinical judgement   ADL Overall ADL's : Needs assistance/impaired Eating/Feeding: Set up;Bed level   Grooming: Set up;Sitting Grooming Details (indicate cue type and reason): simulated         Upper Body Dressing : Minimal assistance Upper Body Dressing Details (indicate cue type and reason): simulated at bed level Lower Body Dressing: Maximal assistance;Sit to/from stand   Toilet Transfer: Moderate assistance;BSC/3in1;Rolling walker (2 wheels)                    Cognition Arousal/Alertness: Awake/alert Behavior During Therapy: WFL for tasks assessed/performed Overall Cognitive Status: Within Functional Limits for tasks assessed  Pertinent Vitals/ Pain       Pain Assessment Pain Assessment: No/denies pain         Frequency  Min 1X/week        Progress Toward Goals  OT Goals(current goals can now be found  in the care plan section)  Progress towards OT goals: Progressing toward goals  Acute Rehab OT Goals Patient Stated Goal: to get better OT Goal Formulation: With patient Time For Goal Achievement: 08/14/22 Potential to Achieve Goals: Good  Plan Discharge plan remains appropriate       AM-PAC OT "6 Clicks" Daily Activity     Outcome Measure   Help from another person eating meals?: None Help from another person taking care of personal grooming?: A Little Help from another person toileting, which includes using toliet, bedpan, or urinal?: A Lot Help from another person bathing (including washing, rinsing, drying)?: A Lot Help from another person to put on and taking off regular upper body clothing?: A Little Help from another person to put on and taking off regular lower body clothing?: A Lot 6 Click Score: 16    End of Session Equipment Utilized During Treatment: Gait belt;Rolling walker (2 wheels)  OT Visit Diagnosis: Muscle weakness (generalized) (M62.81);Unsteadiness on feet (R26.81)   Activity Tolerance Other (comment) (Fair+ overall tolerance)   Patient Left in chair;with call bell/phone within reach;with chair alarm set;with family/visitor present   Nurse Communication Mobility status        Time: 1325-1345 OT Time Calculation (min): 20 min  Charges: OT General Charges $OT Visit: 1 Visit OT Treatments $Therapeutic Activity: 8-22 mins     Reuben Likes, OTR/L 08/12/2022, 3:01 PM

## 2022-08-12 NOTE — Progress Notes (Addendum)
Progress Note   LOS: 14 days   Chief Complaint: Diarrhea   Subjective   Patient states he is feeling better today.  Only had 3 bowel movements since yesterday which is improvement.  Tolerating diet without difficulty.     Objective   Vital signs in last 24 hours: Temp:  [98.2 F (36.8 C)-98.7 F (37.1 C)] 98.7 F (37.1 C) (06/13 0530) Pulse Rate:  [57-72] 63 (06/13 0530) Resp:  [16-23] 16 (06/13 0530) BP: (122-142)/(46-79) 122/79 (06/13 0530) SpO2:  [99 %-100 %] 99 % (06/13 0756) FiO2 (%):  [21 %] 21 % (06/13 0756) Last BM Date : 08/11/22 Last BM recorded by nurses in past 5 days Stool Type: Type 7 (Liquid consistency with no solid pieces) (08/11/2022  7:45 PM)  General:   male in no acute distress Heart:  Regular rate and rhythm; no murmurs Pulm: Clear anteriorly; no wheezing Abdomen: soft, nondistended, normal bowel sounds in all quadrants. Nontender without guarding. No organomegaly appreciated. Extremities:  No edema Neurologic:  Alert and  oriented x4;  No focal deficits.  Psych:  Cooperative. Normal mood and affect.  Intake/Output from previous day: 06/12 0701 - 06/13 0700 In: 264.3 [IV Piggyback:264.3] Out: 1050 [Urine:500; Stool:550] Intake/Output this shift: No intake/output data recorded.  Studies/Results: VAS [place] UPPER EXTREMITY VENOUS DUPLEX  Result Date: 08/10/2022 UPPER VENOUS STUDY  Patient Name:  CAMILLE SHILLITO  Date of Exam:   08/10/2022 Medical Rec #: [id number]            Accession #:    [id number] Date of Birth: 07/09/1940             Patient Gender: M Patient Age:   35 years Exam Location:  [facility] Procedure:      VAS [place] UPPER EXTREMITY VENOUS DUPLEX Referring Phys: RIPUDEEP RAI --------------------------------------------------------------------------------  Indications: Edema Risk Factors: None identified. Limitations: Poor ultrasound/tissue interface, line and bandages. Comparison Study: No prior studies. Performing Technologist:  Chanda Busing RVT  Examination Guidelines: A complete evaluation includes B-mode imaging, spectral Doppler, color Doppler, and power Doppler as needed of all accessible portions of each vessel. Bilateral testing is considered an integral part of a complete examination. Limited examinations for reoccurring indications may be performed as noted.  Right Findings: +----------+------------+---------+-----------+----------+-------+ RIGHT     CompressiblePhasicitySpontaneousPropertiesSummary +----------+------------+---------+-----------+----------+-------+ IJV           Full       Yes       Yes                      +----------+------------+---------+-----------+----------+-------+ Subclavian    Full       Yes       Yes                      +----------+------------+---------+-----------+----------+-------+ Axillary      Full       Yes       Yes                      +----------+------------+---------+-----------+----------+-------+ Brachial      Full                                          +----------+------------+---------+-----------+----------+-------+ Radial        Full                                          +----------+------------+---------+-----------+----------+-------+  Ulnar         Full                                          +----------+------------+---------+-----------+----------+-------+ Cephalic      Full                                          +----------+------------+---------+-----------+----------+-------+ Basilic       Full                                          +----------+------------+---------+-----------+----------+-------+  Left Findings: +----------+------------+---------+-----------+----------+-------+ LEFT      CompressiblePhasicitySpontaneousPropertiesSummary +----------+------------+---------+-----------+----------+-------+ Subclavian    Full       Yes       Yes                       +----------+------------+---------+-----------+----------+-------+  Summary:  Right: No evidence of deep vein thrombosis in the upper extremity. No evidence of superficial vein thrombosis in the upper extremity.  Left: No evidence of thrombosis in the subclavian.  *See table(s) above for measurements and observations.  Diagnosing physician: Coral Else MD Electronically signed by Coral Else MD on 08/10/2022 at 5:49:11 PM.    Final     Lab Results: Recent Labs    08/10/22 0531 08/12/22 0221  WBC 9.3 4.9  HGB 7.6* 7.0*  HCT 22.6* 21.3*  PLT 180 199   BMET Recent Labs    08/10/22 0531 08/11/22 0541 08/12/22 0221  NA 141 139 142  K 3.6 3.1* 4.0  CL 114* 111 115*  CO2 19* 20* 20*  GLUCOSE 93 75 77  BUN <5* <5* <5*  CREATININE 0.98 0.94 1.05  CALCIUM 6.9* 7.2* 7.3*   LFT Recent Labs    08/10/22 0531  ALBUMIN 2.5*   PT/INR No results for input(s): "LABPROT", "INR" in the last 72 hours.   Scheduled Meds:  acidophilus  2 capsule Oral Daily   acyclovir  400 mg Oral QHS   budesonide (PULMICORT) nebulizer solution  0.25 mg Nebulization BID   calcium carbonate  800 mg of elemental calcium Oral TID   Chlorhexidine Gluconate Cloth  6 each Topical Daily   cholestyramine light  4 g Oral BID   enoxaparin (LOVENOX) injection  100 mg Subcutaneous Q12H   feeding supplement  1 Container Oral TID BM   furosemide  20 mg Intravenous Once   leptospermum manuka honey  1 Application Topical Daily   loperamide  2 mg Oral BID   multivitamin with minerals  1 tablet Oral Daily   mouth rinse  15 mL Mouth Rinse 4 times per day   pantoprazole (PROTONIX) IV  40 mg Intravenous Q12H   potassium chloride  40 mEq Oral TID BM   sodium bicarbonate  1,300 mg Oral TID   sodium chloride flush  10-40 mL Intracatheter Q12H   Continuous Infusions:  ampicillin-sulbactam (UNASYN) IV 3 g (08/12/22 0740)   calcium gluconate        Patient profile:   82 year old male with multiple myeloma, currently  receiving treatment, admitted with respiratory symptoms, found to have new pulmonary embolism being evaluated for profuse diarrhea and  electrolyte imbalances.    Impression:   Profuse Diarrhea -Previously diagnosed with E. coli and given 3-day course of azithromycin.  Most recent stool studies show negative C. difficile and negative E. coli (negative GI pathogen panel) - Previous pseudoobstruction with recent abdominal x-ray showing resolution. - phosphorus 2.3 - magnesium 1.7 - K 4.0 - Flexible sigmoidoscopy 6/10: scattered area of edematous mucosa in proximal sigmoid colon, descending colon, and transverse. Diverticulosis. Biopsies show hyperplastic change - Continued improvement in stool output.  550 mL    Plan:   -Discussed with pathology to check biopsy and stain to rule out amyloidosis -Continue to monitor and correct electrolytes -Continue twice daily cholestyramine and twice daily Imodium  Bayley M McMichael  08/12/2022, 9:17 AM    [facility] Attending   I have taken an interval history, reviewed the chart and examined the patient. I agree with the Advanced Practitioner's note, impression and recommendations.  He is improved. Discussed dc rectal tube but mobility issues indicate not yet. Signing off.  Call back prn - continue current tx - he can see GI prn as outpatient - I do not think he needs an appointment at dc - would suspect he will go to SNF  Iva Boop, MD, [facility] See Loretha Stapler on call - gastroenterology for best contact person 08/12/2022 4:59 PM

## 2022-08-12 NOTE — Care Management Important Message (Signed)
Important Message  Patient Details IM Letter given. Name: LEELAND LOVELADY MRN: [id number] Date of Birth: [birth year]   Medicare Important Message Given:  Yes     Caren Macadam 08/12/2022, 1:16 PM

## 2022-08-12 NOTE — NC FL2 (Signed)
[place] MEDICAID FL2 LEVEL OF CARE FORM     IDENTIFICATION  Patient Name: Frank Hernandez Birthdate: 10-11-1940 Sex: male Admission Date (Current Location): 07/29/2022  [facility] and [place] Number:  Producer, television/film/video and Address:  [facility],  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   Hypomagnesemia 07/29/2022   Bowel obstruction (HCC) 07/29/2022   Prolonged QT interval 07/29/2022   Cellulitis of both lower extremities 07/29/2022   Acute pulmonary embolism (HCC) 07/29/2022   Glaucoma 07/29/2022   Bradyarrhythmia 06/01/2022   Sinus pause 05/29/2022   Multiple myeloma (HCC) 05/29/2022   AKI (acute kidney injury) (HCC) 05/28/2022   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   Hypotension 05/28/2022   Dehydration 05/28/2022   Diarrhea 05/28/2022   Lactic acidosis 05/28/2022   Counseling regarding advance care planning and goals of care 04/22/2022   Multiple myeloma not having achieved remission (HCC) 04/21/2022   Vitamin D deficiency 04/19/2022   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) 04/15/2022   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   Closed displaced subtrochanteric fracture of right femur (HCC) 04/13/2022   Normocytic anemia 04/13/2022   Chronic kidney disease, stage 3a (HCC) 04/03/2022   Decreased oral intake 04/02/2022   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   Essential hypertension  04/01/2022   Pain in joint, lower leg 04/01/2022   Effusion of right knee 03/30/2022   Pressure injury of skin 03/30/2022   Effusion, right knee 03/30/2022   Concussion with unknown loss of consciousness status 12/24/2020   Hypokalemia    Rib fractures 06/10/2020   Severe essential hypertension 11/15/2018   Syncope 12/26/2015   Pre-diabetes 12/26/2015    Orientation RESPIRATION BLADDER Height & Weight     Self, Time, Situation, Place  Normal Incontinent Weight: 223 lb 1.7 oz (101.2 kg) Height:  5\' 9"  (175.3 cm)  BEHAVIORAL SYMPTOMS/MOOD NEUROLOGICAL BOWEL NUTRITION STATUS      Incontinent Diet (See discharge summary)  AMBULATORY STATUS COMMUNICATION OF NEEDS Skin   Limited Assist Verbally PU Stage and Appropriate Care (Buttocks Right;Left Deep Tissue Pressure Injury; Heel Right Stage 3 -)     PU Stage 3 Dressing: BID                 Personal Care Assistance Level of Assistance  Bathing, Dressing, Feeding Bathing Assistance: Limited assistance Feeding assistance: Independent Dressing Assistance: Limited assistance     Functional Limitations Info  Sight, Hearing, Speech Sight Info: Impaired Hearing Info: Adequate Speech Info: Adequate    SPECIAL CARE FACTORS FREQUENCY  PT (By licensed PT), OT (By licensed OT)     PT Frequency: 5x/wk OT Frequency: 5x/wk            Contractures Contractures Info: Not present    Additional Factors Info  Code Status, Allergies Code Status Info: FULL Allergies Info: No Known Allergies           Current Medications (08/12/2022):  This is the  current hospital active medication list Current Facility-Administered Medications  Medication Dose Route Frequency Provider Last Rate Last Admin   acetaminophen (TYLENOL) tablet 650 mg  650 mg Oral Q6H PRN Cirigliano, Vito V, DO   650 mg at 07/31/22 1138   Or   acetaminophen (TYLENOL) suppository 650 mg  650 mg Rectal Q6H PRN Cirigliano, Vito V, DO       acidophilus (RISAQUAD) capsule  2 capsule  2 capsule Oral Daily Vann, Jessica U, DO   2 capsule at 08/12/22 0742   acyclovir (ZOVIRAX) tablet 400 mg  400 mg Oral QHS Cirigliano, Vito V, DO   400 mg at 08/11/22 2203   Ampicillin-Sulbactam (UNASYN) 3 g in sodium chloride 0.9 % 100 mL IVPB  3 g Intravenous Q6H Rai, Ripudeep K, MD 200 mL/hr at 08/12/22 0740 3 g at 08/12/22 0740   budesonide (PULMICORT) nebulizer solution 0.25 mg  0.25 mg Nebulization BID Cirigliano, Vito V, DO   0.25 mg at 08/12/22 0756   calcium carbonate (TUMS - dosed in mg elemental calcium) chewable tablet 800 mg of elemental calcium  800 mg of elemental calcium Oral TID Cirigliano, Vito V, DO   800 mg of elemental calcium at 08/11/22 2020   calcium gluconate 1 g/ 50 mL sodium chloride IVPB  1 g Intravenous Daily PRN Vann, Jessica U, DO       Chlorhexidine Gluconate Cloth 2 % PADS 6 each  6 each Topical Daily Cirigliano, Vito V, DO   6 each at 08/12/22 1018   cholestyramine light (PREVALITE) packet 4 g  4 g Oral BID McMichael, Bayley M, PA-C   4 g at 08/12/22 1029   enoxaparin (LOVENOX) injection 100 mg  100 mg Subcutaneous Q12H Cirigliano, Vito V, DO   100 mg at 08/12/22 1610   feeding supplement (BOOST / RESOURCE BREEZE) liquid 1 Container  1 Container Oral TID BM Cirigliano, Vito V, DO   1 Container at 08/12/22 1030   HYDROmorphone (DILAUDID) injection 0.5 mg  0.5 mg Intravenous Q2H PRN Cirigliano, Vito V, DO       ipratropium-albuterol (DUONEB) 0.5-2.5 (3) MG/3ML nebulizer solution 3 mL  3 mL Nebulization Q4H PRN Rai, Ripudeep K, MD   3 mL at 08/11/22 2120   leptospermum manuka honey (MEDIHONEY) paste 1 Application  1 Application Topical Daily Cirigliano, Vito V, DO   1 Application at 08/12/22 1019   loperamide (IMODIUM) capsule 2 mg  2 mg Oral PRN Cirigliano, Vito V, DO   2 mg at 08/10/22 1632   loperamide (IMODIUM) capsule 2 mg  2 mg Oral BID McMichael, Bayley M, PA-C   2 mg at 08/12/22 0742   metoprolol tartrate (LOPRESSOR) injection 5 mg  5 mg Intravenous  Q8H PRN Cirigliano, Vito V, DO   5 mg at 08/01/22 2210   multivitamin with minerals tablet 1 tablet  1 tablet Oral Daily Cirigliano, Vito V, DO   1 tablet at 08/11/22 1451   Oral care mouth rinse  15 mL Mouth Rinse 4 times per day Cirigliano, Vito V, DO   15 mL at 08/12/22 1109   Oral care mouth rinse  15 mL Mouth Rinse PRN Cirigliano, Vito V, DO       pantoprazole (PROTONIX) injection 40 mg  40 mg Intravenous Q12H Cirigliano, Vito V, DO   40 mg at 08/12/22 1030   pneumococcal 20-valent conjugate vaccine (PREVNAR 20) injection 0.5 mL  0.5 mL Intramuscular Prior to discharge Cirigliano, Vito V, DO  potassium chloride SA (KLOR-CON M) CR tablet 40 mEq  40 mEq Oral TID BM Delano Metz, MD   40 mEq at 08/12/22 0741   prochlorperazine (COMPAZINE) injection 5 mg  5 mg Intravenous Q4H PRN Cirigliano, Vito V, DO       sodium bicarbonate tablet 1,300 mg  1,300 mg Oral TID Cirigliano, Vito V, DO   1,300 mg at 08/12/22 0742   sodium chloride flush (NS) 0.9 % injection 10-40 mL  10-40 mL Intracatheter Q12H Cirigliano, Vito V, DO   10 mL at 08/12/22 1019   sodium chloride flush (NS) 0.9 % injection 10-40 mL  10-40 mL Intracatheter PRN Cirigliano, Vito V, DO       zinc oxide 20 % ointment   Topical PRN Cirigliano, Vito V, DO   Given at 08/07/22 1000     Discharge Medications: Please see discharge summary for a list of discharge medications.  Relevant Imaging Results:  Relevant Lab Results:   Additional Information SS# [id number]  Otelia Santee, LCSW

## 2022-08-12 NOTE — Progress Notes (Signed)
Patient BG= 59. Alert and oriented x4. PO orange juice was given. Will recheck BG in 30 minutes.

## 2022-08-13 ENCOUNTER — Inpatient Hospital Stay: Payer: Medicare PPO | Admitting: Hematology

## 2022-08-13 ENCOUNTER — Inpatient Hospital Stay: Payer: Medicare PPO

## 2022-08-13 DIAGNOSIS — K56609 Unspecified intestinal obstruction, unspecified as to partial versus complete obstruction: Secondary | ICD-10-CM | POA: Diagnosis not present

## 2022-08-13 DIAGNOSIS — R0600 Dyspnea, unspecified: Secondary | ICD-10-CM | POA: Diagnosis not present

## 2022-08-13 DIAGNOSIS — I2693 Single subsegmental pulmonary embolism without acute cor pulmonale: Secondary | ICD-10-CM | POA: Diagnosis not present

## 2022-08-13 LAB — CBC
HCT: 22.2 % — ABNORMAL LOW (ref 39.0–52.0)
Hemoglobin: 7.2 g/dL — ABNORMAL LOW (ref 13.0–17.0)
MCH: 30.8 pg (ref 26.0–34.0)
MCHC: 32.4 g/dL (ref 30.0–36.0)
MCV: 94.9 fL (ref 80.0–100.0)
Platelets: 230 10*3/uL (ref 150–400)
RBC: 2.34 MIL/uL — ABNORMAL LOW (ref 4.22–5.81)
RDW: 15.4 % (ref 11.5–15.5)
WBC: 4.4 10*3/uL (ref 4.0–10.5)
nRBC: 0 % (ref 0.0–0.2)

## 2022-08-13 LAB — BASIC METABOLIC PANEL
Anion gap: 6 (ref 5–15)
BUN: <5 mg/dL — ABNORMAL LOW (ref 8–23)
CO2: 20 mmol/L — ABNORMAL LOW (ref 22–32)
Calcium: 7.2 mg/dL — ABNORMAL LOW (ref 8.9–10.3)
Chloride: 115 mmol/L — ABNORMAL HIGH (ref 98–111)
Creatinine, Ser: 1.03 mg/dL (ref 0.61–1.24)
GFR, Estimated: >60 mL/min (ref >60)
Glucose, Bld: 112 mg/dL — ABNORMAL HIGH (ref 70–99)
Potassium: 4.6 mmol/L (ref 3.5–5.1)
Sodium: 141 mmol/L (ref 135–145)

## 2022-08-13 LAB — PHOSPHORUS: Phosphorus: 1.6 mg/dL — ABNORMAL LOW (ref 2.5–4.6)

## 2022-08-13 LAB — GLUCOSE, CAPILLARY
Glucose-Capillary: 107 mg/dL — ABNORMAL HIGH (ref 70–99)
Glucose-Capillary: 108 mg/dL — ABNORMAL HIGH (ref 70–99)
Glucose-Capillary: 127 mg/dL — ABNORMAL HIGH (ref 70–99)
Glucose-Capillary: 57 mg/dL — ABNORMAL LOW (ref 70–99)
Glucose-Capillary: 74 mg/dL (ref 70–99)

## 2022-08-13 LAB — MAGNESIUM: Magnesium: 1.4 mg/dL — ABNORMAL LOW (ref 1.7–2.4)

## 2022-08-13 MED ORDER — POTASSIUM CHLORIDE CRYS ER 20 MEQ PO TBCR
40.0000 meq | EXTENDED_RELEASE_TABLET | Freq: Every day | ORAL | Status: DC
  Administered 2022-08-14 – 2022-08-16 (×3): 40 meq via ORAL
  Filled 2022-08-13 (×3): qty 2

## 2022-08-13 MED ORDER — PANTOPRAZOLE SODIUM 40 MG PO TBEC
40.0000 mg | DELAYED_RELEASE_TABLET | Freq: Two times a day (BID) | ORAL | Status: DC
  Administered 2022-08-13 – 2022-08-16 (×7): 40 mg via ORAL
  Filled 2022-08-13 (×7): qty 1

## 2022-08-13 MED ORDER — FUROSEMIDE 10 MG/ML IJ SOLN
20.0000 mg | Freq: Once | INTRAMUSCULAR | Status: AC
  Administered 2022-08-13: 20 mg via INTRAVENOUS
  Filled 2022-08-13: qty 2

## 2022-08-13 MED ORDER — MAGNESIUM SULFATE 2 GM/50ML IV SOLN
2.0000 g | Freq: Once | INTRAVENOUS | Status: AC
  Administered 2022-08-13: 2 g via INTRAVENOUS
  Filled 2022-08-13: qty 50

## 2022-08-13 MED ORDER — K PHOS MONO-SOD PHOS DI & MONO 155-852-130 MG PO TABS
250.0000 mg | ORAL_TABLET | Freq: Every day | ORAL | Status: DC
  Administered 2022-08-13: 250 mg via ORAL
  Filled 2022-08-13 (×2): qty 1

## 2022-08-13 NOTE — TOC Progression Note (Signed)
Transition of Care [facility]) - Progression Note    Patient Details  Name: Frank Hernandez MRN: [id number] Date of Birth: 07/04/1940  [facility]) CM/SW Contact  Otelia Santee, LCSW Phone Number: 08/13/2022, 12:22 PM  Clinical Narrative:    Pt and spouse accepted bed offer for [facility]. Pt expected to be ready for discharge on Monday, 08/16/22. Insurance [employer] has been requested and currently pending approval.    Expected Discharge Plan: Home/Self Care Barriers to Discharge: Continued Medical Work up  Expected Discharge Plan and Services   Discharge Planning Services: CM Consult   Living arrangements for the past 2 months: Single Family Home                                       Social Determinants of Health (SDOH) Interventions SDOH Screenings   Food Insecurity: No Food Insecurity (07/30/2022)  Recent Concern: Food Insecurity - Food Insecurity Present (05/28/2022)  Housing: Low Risk  (07/30/2022)  Transportation Needs: No Transportation Needs (07/30/2022)  Recent Concern: Transportation Needs - Unmet Transportation Needs (05/28/2022)  Utilities: Not At Risk (07/30/2022)  Depression (PHQ2-9): Low Risk  (04/29/2022)  Tobacco Use: Low Risk  (08/12/2022)    Readmission Risk Interventions    08/03/2022    8:39 AM 04/14/2022    1:48 PM 04/02/2022   11:43 AM  Readmission Risk Prevention Plan  Post Dischage Appt   Complete  Medication Screening   Complete  Transportation Screening Complete Complete Complete  PCP or Specialist Appt within 5-7 Days  Complete   Home Care Screening  Complete   Medication Review (RN CM)  Complete   Medication Review (RN Care Manager) Complete    HRI or Home Care Consult Complete    SW Recovery Care/Counseling Consult Complete    Palliative Care Screening Not Applicable    Skilled Nursing Facility Complete

## 2022-08-13 NOTE — Plan of Care (Signed)

## 2022-08-13 NOTE — Progress Notes (Signed)
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  Pt with history of remote bladder cancer, pt underwent IM nail R femur d/t pathological fx +Bx on 04/17/22 hospitalized diagnosed with multiple myeloma, CKD, hypertension recently started on treatment for multiple myeloma and admitted 07/29/22    PT Comments     Pt admitted with above diagnosis.  Pt currently with functional limitations due to the deficits listed below (see PT Problem List).  Pt in bed when PT arrived. Pt agreeable to therapy intervention. Pt required increased time, mod A and use of hospital bed for supine to sit, good static sitting balance EOB. PT assessed safety with use of STS/mechanical lift due to NT report of heavy A x 2 for SPT recliner to bed, pt able to follow commands for proper use of lift equipment for bed to recliner transfer. NT made aware of PT recommendation for use of STS lift. Pt required max A for STS from recliner, cues for proper UE placement and posterior LOB with initial standing. Pt amb short distance 6 feet in room with RW, min A and recliner close behind. Pt left seated in recliner, all needs in place. Pt will benefit from acute skilled PT to increase their independence and safety with mobility to allow discharge.     Recommendations for follow up therapy are one component of a multi-disciplinary discharge planning process, led by the attending physician.  Recommendations may be updated based on patient status, additional functional criteria and insurance authorization.  Follow Up  Recommendations  Can patient physically be transported by private vehicle: No    Assistance Recommended at Discharge Frequent or constant Supervision/Assistance  Patient can return home with the following A lot of help with walking and/or transfers;A lot of help with bathing/dressing/bathroom;Assistance with cooking/housework;Assist for transportation;Help with stairs or ramp for entrance   Equipment Recommendations  None recommended by PT    Recommendations for Other Services       Precautions / Restrictions Precautions Precautions: Fall Precaution Comments: Flexi Seal Restrictions Weight Bearing Restrictions: No Other Position/Activity Restrictions: rectal tube     Mobility  Bed Mobility Overal bed mobility: Needs Assistance Bed Mobility: Supine to Sit     Supine to sit: Mod assist, HOB elevated, +2 for safety/equipment     General bed mobility comments: increased time and use of bed rails    Transfers Overall transfer level: Needs assistance Equipment used: Rolling walker (2 wheels) Transfers: Sit to/from Stand (from recliner) Sit to Stand: Max assist, +2 safety/equipment           General transfer comment: nursing staff indicates difficulty with transfer tasks recliner to bed with A x 2, PT assessed for pt appropriatness and safety with use of STS lift, pt able to tolerate well, follow commands for proper UE and LE placement and pull to stand from elevated EOB with min guard. PT assessed pt STS from lower recliner and pt requried max A to RW, cues for proper UE placement and noted posterior LOB with initital standing    Ambulation/Gait Ambulation/Gait assistance: +2 safety/equipment, Min assist Gait Distance (Feet): 6 Feet Assistive device: Rolling walker (2 wheels) Gait  Pattern/deviations: Step-to pattern, Decreased stride length, Shuffle, Trunk flexed Gait velocity: decreased         Stairs             Wheelchair Mobility    Modified Rankin  (Stroke Patients Only)       Balance Overall balance assessment: Needs assistance Sitting-balance support: Feet supported, No upper extremity supported Sitting balance-Leahy Scale: Fair Sitting balance - Comments: could sit without assist   Standing balance support: Bilateral upper extremity supported, Reliant on assistive device for balance, During functional activity Standing balance-Leahy Scale: Poor Standing balance comment: Requiring RW and min A                            Cognition Arousal/Alertness: Awake/alert Behavior During Therapy: WFL for tasks assessed/performed Overall Cognitive Status: Within Functional Limits for tasks assessed                                 General 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  Following all directions.  Feeling "better".  Motivated.        Exercises      General Comments        Pertinent Vitals/Pain Pain Assessment Pain Assessment: No/denies pain    Home Living Family/patient expects to be discharged to:: Unsure Living Arrangements: Spouse/significant other Available Help at Discharge: Family Type of Home: House Home Access: Ramped entrance       Home Layout: One level Home Equipment: Agricultural consultant (2 wheels);BSC/3in1;Wheelchair - manual Additional Comments: Pt sleeps in his Lift recliner.  BSC atop toilet.    Prior Function            PT Goals (current goals can now be found in the care plan section) Acute Rehab PT Goals PT Goal Formulation: With patient Time For Goal Achievement: 08/14/22 Potential to Achieve Goals: Good    Frequency    Min 1X/week      PT Plan      Co-evaluation              AM-PAC PT "6 Clicks" Mobility   Outcome Measure  Help needed turning from your back to your side while in a flat bed without using bedrails?: A Lot Help needed moving from lying on your back to sitting on the side of a flat bed without using bedrails?: A Lot Help  needed moving to and from a bed to a chair (including a wheelchair)?: A Lot Help needed standing up from a chair using your arms (e.g., wheelchair or bedside chair)?: A Lot Help needed to walk in hospital room?: A Lot Help needed climbing 3-5 steps with a railing? : Total 6 Click Score: 11    End of Session Equipment Utilized During Treatment: Gait belt Activity Tolerance: Patient tolerated treatment well Patient left: with chair alarm set;in chair;with call bell/phone within reach Nurse Communication: Mobility status PT Visit Diagnosis: Difficulty in walking, not elsewhere classified (R26.2);Muscle weakness (generalized) (M62.81)     Time: 1478-2956 PT Time Calculation (min) (ACUTE ONLY): 19 min  Charges:  $Therapeutic Activity: 8-22 mins                     Johnny Bridge, PT Acute Rehab    Jacqualyn Posey 08/13/2022, 11:51 AM

## 2022-08-13 NOTE — Progress Notes (Signed)
[facility]                                                                              Frank Hernandez, is a 82 y.o. male, DOB - June 06, 1940, [id number] Admit date - 07/29/2022    Outpatient Primary MD for the patient is Frank Hernandez (Inactive)  LOS - 15  days  No chief complaint on file.      Brief summary   Patient is a 82 year old with hypertension, prediabetes, stage IIIa CKD, pathological fracture of the right femoral, multiple myeloma presented to ED with wheezing and rhonchi. Patient has been taking antibiotics for cellulitis.  He has had worsening of his right foot wound.  He reported abdominal distention with watery stool.    Chest x-ray showed no active cardiopulmonary disease.  CTA chest showed left lower lobe segmental PE with minimal clot burden. CT abdomen with dilated colon with air-fluid level seen and scattered colonic diverticula.  Transition seen in the area of the new left inguinal hernia involving the sigmoid colon.  No pneumatosis.  Small bowel and stomach are known dilated but there is an enteric tube.  Separate large known right inguinal hernia involving the small bowel and significant portion of the urinary bladder.  Lytic bone lesion consistent with known history of myeloma.   Patient was admitted with PE and a small bowel obstruction.  Surgery and GI consulted. 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   Slow to improve with large amounts of diarrhea causing electrolyte disturbances.  Agreeable to SNF.  Diarrhea improving.     Assessment & Plan     Acute left lower lobe segmental pulmonary embolism,  acute DVT left femoral vein -CTA chest showed left lower lobe segmental PE, minimal clot burden -Venous Doppler consistent with acute DVT involving left common femoral vein, indeterminate age DVT. --2D echo showed normal RV function -Lovenox -- ? Change to NOAC prior to d/c?  SBO;  Pseudo-obstruction Ogilvie Syndrome, colonic distention: -Presents with obstructive symptoms, CT abdomen pelvis showed Dilated colon with air-fluid level seen and scattered colonic diverticula. Transition seen in the area of the new left inguinal hernia involving the sigmoid colon. No pneumatosis, free air or fluid. Small bowel and stomach are nondilated -NG tube  has been removed by General Surgery.  -Per GI: Ogilvie's/Pseudo-obstruction.  -diet advanced    Diarrhea; - C diff negative. Having issues with Diarrhea since last hospitalization.  -GI pathogen panel positive for enteropathogenic E coli, completed azithromycin for 3 days.  -Thought to be related to Chemo: Daratumumab however seen by oncology, Dr. 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  Aspiration -On 6/10, patient noted to be short of breath with wheezing -BNP 671.3, procalcitonin 0.22, chest x-ray  showed new left lower lung consolidation -Received Lasix PRN -Placed on Unasyn IV- d/c after 5 days -Venous Doppler upper extremity, negative for DVT RUE   NAG Metabolic Acidosis: in setting of GI losses, profound electrolyte abnormalities --Nephrology consulted and signed off  Hypomagnesemia, hypokalemia -replace  Hypophosphatemia -replete  Hypocalcemia -Placed on twice daily calcium replacement -Vitamin D 34.7, calcitriol, PTH 549  Cellulitis of both lower extremity: Completed 5 days Ancef.    Prolonged QT   Replete electrolytes    Hypertension:  -BP currently stable   Pre-Diabetes: - diet controlled.    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 -Continue as needed Tylenol   Multiple myeloma -Oncology following   Pressure Injury Documentation:    Pressure Injury 08/08/22 Buttocks Right;Left Deep Tissue Pressure Injury - Purple or maroon localized area of discolored intact skin or blood-filled blister due to damage of underlying soft tissue from pressure and/or shear. (Active)  08/08/22 1241  Location: Buttocks  Location Orientation: Right;Left  Staging: Deep Tissue Pressure Injury - Purple or maroon localized area of discolored intact skin or blood-filled blister due to damage of underlying soft tissue from pressure and/or shear.  Wound Description (Comments):   Present on Admission:   Dressing Type Foam - Lift dressing to assess site every shift 08/10/22 1955   Mild to moderate protein calorie malnutrition, hypoalbuminemia Nutrition Problem: Inadequate oral intake Etiology: acute illness (small bowel obstruction) Signs/Symptoms: energy intake < 75% for > 7 days Interventions: Refer to RD note for recommendations, Boost Breeze, MVI Estimated body mass index is 32.95 kg/m as calculated from the following:   Height as of this encounter: 5\' 9"  (1.753 m).   Weight as of this encounter: 101.2 kg.  Code Status: Full code DVT Prophylaxis:  Lovenox  Level of Care: Level of care: Telemetry Family Communication: Updated patient/wife on phone Disposition Plan:      Remains inpatient appropriate:   SNF on Monday?     Consultants:   General surgery GI Nephrology     Medications  acidophilus  2 capsule Oral Daily   acyclovir  400 mg Oral QHS   budesonide (PULMICORT) nebulizer solution  0.25 mg Nebulization  BID   calcium carbonate  800 mg of elemental calcium Oral TID   Chlorhexidine Gluconate Cloth  6 each Topical Daily   cholestyramine light  4 g Oral BID   enoxaparin (LOVENOX) injection  100 mg Subcutaneous Q12H   feeding supplement  1 Container Oral TID BM   leptospermum manuka honey  1 Application Topical Daily   loperamide  2 mg Oral BID   multivitamin with minerals  1 tablet Oral Daily   mouth rinse  15 mL Mouth Rinse 4 times per day   pantoprazole  40 mg Oral BID   potassium chloride  40 mEq Oral TID BM   sodium bicarbonate  1,300 mg Oral TID   sodium chloride flush  10-40 mL Intracatheter Q12H      Subjective:   Overall feeling better    Objective:   Vitals:   08/12/22 0756 08/12/22 1440 08/12/22 1923 08/13/22 0604  BP:  135/85 128/78 134/73  Pulse:  73 77 79  Resp:  (!) 22 16 16   Temp:  99.1  F (37.3 C) 98.4 F (36.9 C) 98.2 F (36.8 C)  TempSrc:   Oral Oral  SpO2: 99% 100% 100% 100%  Weight:      Height:        Intake/Output Summary (Last 24 hours) at 08/13/2022 1110 Last data filed at 08/13/2022 1000 Gross per 24 hour  Intake 805.84 ml  Output 1900 ml  Net -1094.16 ml     Wt Readings from Last 3 Encounters:  08/07/22 101.2 kg  07/16/22 90.5 kg  07/09/22 91.9 kg   Physical Exam   General: Appearance:    Obese male in no acute distress     Lungs:      respirations unlabored  Heart:    Normal heart rate. Normal rhythm. No murmurs, rubs, or gallops.   MS:   All extremities are intact.   Neurologic:   Awake, alert      Data Reviewed:  I have personally reviewed following labs    CBC Lab Results  Component Value Date   WBC 4.4 08/13/2022   RBC 2.34 (L) 08/13/2022   HGB 7.2 (L) 08/13/2022   HCT 22.2 (L) 08/13/2022   MCV 94.9 08/13/2022   MCH 30.8 08/13/2022   PLT 230 08/13/2022   MCHC 32.4 08/13/2022   RDW 15.4 08/13/2022   LYMPHSABS 2.0 07/29/2022   MONOABS 1.0 07/29/2022   EOSABS 0.2 07/29/2022   BASOSABS 0.0 07/29/2022      Last metabolic panel Lab Results  Component Value Date   NA 141 08/13/2022   K 4.6 08/13/2022   CL 115 (H) 08/13/2022   CO2 20 (L) 08/13/2022   BUN <5 (L) 08/13/2022   CREATININE 1.03 08/13/2022   GLUCOSE 112 (H) 08/13/2022   GFRNONAA >60 08/13/2022   GFRAA >60 12/27/2015   CALCIUM 7.2 (L) 08/13/2022   PHOS 1.6 (L) 08/13/2022   PROT 4.3 (L) 08/06/2022   ALBUMIN 2.5 (L) 08/10/2022   LABGLOB 2.3 07/16/2022   AGRATIO 0.9 04/14/2022   BILITOT 0.4 08/06/2022   ALKPHOS 66 08/06/2022   AST 25 08/06/2022   ALT 5 08/06/2022   ANIONGAP 6 08/13/2022    CBG (last 3)  Recent Labs    08/13/22 0050 08/13/22 0603 08/13/22 0647  GLUCAP 108* 57* 107*      Coagulation Profile: No results for input(s): "INR", "PROTIME" in the last 168 hours.   Radiology Studies: I have personally reviewed the imaging studies  No results found.     Joseph Art DO [facility] 08/13/2022, 11:10 AM  Available via Epic secure chat 7am-7pm After 7 pm, please refer to night coverage provider listed on amion.

## 2022-08-13 NOTE — Progress Notes (Incomplete)
HEMATOLOGY/ONCOLOGY CLINIC NOTE  Date of Service: 08/13/22   Patient Care Team: [name] (Inactive) as PCP - General (Surgery) Nahser, Deloris Ping, MD as Consulting Physician (Cardiology)  CHIEF COMPLAINTS/PURPOSE OF CONSULTATION:  Evaluation and management of recently diagnosed multiple myeloma  HISTORY OF PRESENTING ILLNESS:   Frank Hernandez is a wonderful 82 y.o. male here for evaluation and management of newly diagnosed multiple myeloma.  Patient was initially seen by on 04/14/2022 for evaluation and management of multiple lytic bone lesions concerning for metastatic malignancy and pathologic hip fracture.   Patients pathology results from surgery on his rt hip confirms diagnosis of Multiple myeloma . 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    INTERVAL HISTORY:  Frank Hernandez is a 82 y.o. male here for continued evaluation and management of newly diagnosed multiple myeloma. Patient was seen by me on 07/09/2022 and complained of poor p.o. intake and vomiting with ensure supplemental drinks. He also complained of depression which did affect his appetite. He also reported that his LE wound was not healing and was painful. Additionally, he complained of slightly loose stools.  Patient was seen as an inpatient on 07/29/2022 for severe watery diarrhea and abdominal distension. He also had cellulitis with use of antibiotics. Patient had a CT of the abdomen which showed dilated colon with air-fluid levels with a transition in the area of the new left inguinal hernia involving the sigmoid colon.  Separate large right inguinal hernia. He had continued to have persistent significant high-volume diarrhea and has been followed by gastroenterology.  Patient noted that he was having diarrhea even before the daratumumab. Even on daratumumab was held previously his diarrhea did not really improve. Patient was also noted to have acute DVT of left femoral vein.  Left  lower lobe segmental pulmonary embolism.  Today, he is scheduled to receive cycle 3 day 15 of his Daratumumab/Dexamethasone treatment.   -Discussed lab results on 08/13/2022 in detail with patient. CBC showed WBC of 4.4K, hemoglobin of 7.2, and platelets of 230K. 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   Pre-diabetes     SURGICAL HISTORY: Past Surgical History:  Procedure Laterality Date   BIOPSY  08/09/2022   Procedure: BIOPSY;  Surgeon: Shellia Cleverly, DO;  Location: [facility];  Service: Gastroenterology;;   COLONOSCOPY     FLEXIBLE SIGMOIDOSCOPY N/A 08/09/2022   Procedure: FLEXIBLE SIGMOIDOSCOPY;  Surgeon: Shellia Cleverly, DO;  Location: [facility];  Service: Gastroenterology;  Laterality: N/A;  propofol   INTRAMEDULLARY (IM) NAIL INTERTROCHANTERIC Left 06/11/2020   Procedure: INTRAMEDULLARY (IM) NAIL INTERTROCHANTRIC;  Surgeon: Roby Lofts, MD;  Location: [facility];  Service: Orthopedics;  Laterality: Left;   INTRAMEDULLARY (IM) NAIL INTERTROCHANTERIC Right 04/17/2022   Procedure: INTRAMEDULLARY (IM) NAIL INTERTROCHANTERIC;  Surgeon: London Sheer, MD;  Location: [facility];  Service: Orthopedics;  Laterality: Right;   KNEE ARTHROSCOPY Bilateral    POLYPECTOMY      SOCIAL HISTORY: Social History   Socioeconomic History   Marital status: Single    Spouse name: Not on file   Number  of children: Not on file   Years of education: Not on file   Highest education level: Not on file  Occupational History   Not on file  Tobacco Use   Smoking status: Never   Smokeless tobacco: Never  Vaping Use   Vaping  Use: Never used  Substance and Sexual Activity   Alcohol use: No    Alcohol/week: 0.0 standard drinks of alcohol   Drug use: No   Sexual activity: Not on file  Other Topics Concern   Not on file  Social History Narrative   Not on file   Social Determinants of Health   Financial Resource Strain: Not on file  Food Insecurity: No Food Insecurity (07/30/2022)   Hunger Vital Sign    Worried About Running Out of Food in the Last Year: Never true    Ran Out of Food in the Last Year: Never true  Recent Concern: Food Insecurity - Food Insecurity Present (05/28/2022)   Hunger Vital Sign    Worried About Running Out of Food in the Last Year: Never true    Ran Out of Food in the Last Year: Sometimes true  Transportation Needs: No Transportation Needs (07/30/2022)   PRAPARE - Administrator, Civil Service (Medical): No    Lack of Transportation (Non-Medical): No  Recent Concern: Transportation Needs - Unmet Transportation Needs (05/28/2022)   PRAPARE - Administrator, Civil Service (Medical): Yes    Lack of Transportation (Non-Medical): Yes  Physical Activity: Not on file  Stress: Not on file  Social Connections: Not on file  Intimate Partner Violence: Not At Risk (07/30/2022)   Humiliation, Afraid, Rape, and Kick questionnaire    Fear of Current or Ex-Partner: No    Emotionally Abused: No    Physically Abused: No    Sexually Abused: No    FAMILY HISTORY: Family History  Problem Relation Age of Onset   Colon cancer Neg Hx     ALLERGIES:  has No Known Allergies.  MEDICATIONS:  No current facility-administered medications for this visit.   No current outpatient medications on file.   Facility-Administered Medications Ordered in Other Visits  Medication Dose Route Frequency Provider Last Rate Last Admin   acetaminophen (TYLENOL) tablet 650 mg  650 mg Oral Q6H PRN Cirigliano, Vito V, DO   650 mg at 07/31/22 1138   Or   acetaminophen (TYLENOL) suppository 650  mg  650 mg Rectal Q6H PRN Cirigliano, Vito V, DO       acidophilus (RISAQUAD) capsule 2 capsule  2 capsule Oral Hernandez Vann, Jessica U, DO   2 capsule at 08/13/22 0816   acyclovir (ZOVIRAX) tablet 400 mg  400 mg Oral QHS Cirigliano, Vito V, DO   400 mg at 08/12/22 1950   Ampicillin-Sulbactam (UNASYN) 3 g in sodium chloride 0.9 % 100 mL IVPB  3 g Intravenous Q6H Vann, Jessica U, DO 200 mL/hr at 08/13/22 0824 3 g at 08/13/22 0824   budesonide (PULMICORT) nebulizer solution 0.25 mg  0.25 mg Nebulization BID Cirigliano, Vito V, DO   0.25 mg at 08/12/22 1945   calcium carbonate (TUMS - dosed in mg elemental calcium) chewable tablet 800 mg of elemental calcium  800 mg of elemental calcium Oral TID Cirigliano, Vito V, DO   800 mg of elemental calcium at 08/13/22 0818   calcium gluconate 1 g/ 50 mL sodium chloride IVPB  1 g Intravenous Hernandez PRN Joseph Art, DO  Chlorhexidine Gluconate Cloth 2 % PADS 6 each  6 each Topical Hernandez Cirigliano, Vito V, DO   6 each at 08/13/22 1105   cholestyramine light (PREVALITE) packet 4 g  4 g Oral BID McMichael, Bayley M, PA-C   4 g at 08/13/22 1112   enoxaparin (LOVENOX) injection 100 mg  100 mg Subcutaneous Q12H Cirigliano, Vito V, DO   100 mg at 08/13/22 0526   feeding supplement (BOOST / RESOURCE BREEZE) liquid 1 Container  1 Container Oral TID BM Cirigliano, Vito V, DO   1 Container at 08/13/22 1112   HYDROmorphone (DILAUDID) injection 0.5 mg  0.5 mg Intravenous Q2H PRN Cirigliano, Vito V, DO       ipratropium-albuterol (DUONEB) 0.5-2.5 (3) MG/3ML nebulizer solution 3 mL  3 mL Nebulization Q4H PRN Rai, Ripudeep K, MD   3 mL at 08/11/22 2120   leptospermum manuka honey (MEDIHONEY) paste 1 Application  1 Application Topical Hernandez Cirigliano, Vito V, DO   1 Application at 08/13/22 1114   loperamide (IMODIUM) capsule 2 mg  2 mg Oral PRN Cirigliano, Vito V, DO   2 mg at 08/10/22 1632   loperamide (IMODIUM) capsule 2 mg  2 mg Oral BID McMichael, Bayley M, PA-C   2 mg  at 08/13/22 1610   magnesium sulfate IVPB 2 g 50 mL  2 g Intravenous Once Joseph Art, DO 50 mL/hr at 08/13/22 1203 2 g at 08/13/22 1203   metoprolol tartrate (LOPRESSOR) injection 5 mg  5 mg Intravenous Q8H PRN Cirigliano, Vito V, DO   5 mg at 08/01/22 2210   multivitamin with minerals tablet 1 tablet  1 tablet Oral Hernandez Cirigliano, Vito V, DO   1 tablet at 08/12/22 1536   Oral care mouth rinse  15 mL Mouth Rinse 4 times per day Cirigliano, Vito V, DO   15 mL at 08/13/22 1205   Oral care mouth rinse  15 mL Mouth Rinse PRN Cirigliano, Vito V, DO       pantoprazole (PROTONIX) EC tablet 40 mg  40 mg Oral BID Hessie Knows, RPH   40 mg at 08/13/22 1112   phosphorus (K PHOS NEUTRAL) tablet 250 mg  250 mg Oral Hernandez Vann, Jessica U, DO       pneumococcal 20-valent conjugate vaccine (PREVNAR 20) injection 0.5 mL  0.5 mL Intramuscular Prior to discharge Cirigliano, Vito V, DO       [START ON 08/14/2022] potassium chloride SA (KLOR-CON M) CR tablet 40 mEq  40 mEq Oral Hernandez Vann, Jessica U, DO       prochlorperazine (COMPAZINE) injection 5 mg  5 mg Intravenous Q4H PRN Cirigliano, Vito V, DO       sodium bicarbonate tablet 1,300 mg  1,300 mg Oral TID Cirigliano, Vito V, DO   1,300 mg at 08/13/22 0817   sodium chloride flush (NS) 0.9 % injection 10-40 mL  10-40 mL Intracatheter Q12H Cirigliano, Vito V, DO   10 mL at 08/13/22 1105   sodium chloride flush (NS) 0.9 % injection 10-40 mL  10-40 mL Intracatheter PRN Cirigliano, Vito V, DO       zinc oxide 20 % ointment   Topical PRN Cirigliano, Vito V, DO   Given at 08/07/22 1000    REVIEW OF SYSTEMS:    10 Point review of Systems was done is negative except as noted above.   PHYSICAL EXAMINATION: ECOG PERFORMANCE STATUS: 2 - Symptomatic, <50% confined to bed  There were no vitals filed  for this visit.  GENERAL:alert, in no acute distress and comfortable SKIN: no acute rashes, no significant lesions EYES: conjunctiva are pink and non-injected,  sclera anicteric OROPHARYNX: MMM, no exudates, no oropharyngeal erythema or ulceration NECK: supple, no JVD LYMPH:  no palpable lymphadenopathy in the cervical, axillary or inguinal regions LUNGS: clear to auscultation b/l with normal respiratory effort HEART: regular rate & rhythm ABDOMEN:  normoactive bowel sounds , non tender, not distended. Extremity: no pedal edema PSYCH: alert & oriented x 3 with fluent speech NEURO: no focal motor/sensory deficits   LABORATORY DATA:  I have reviewed the data as listed .    Latest Ref Rng & Units 08/13/2022    3:32 AM 08/12/2022    2:21 AM 08/10/2022    5:31 AM  CBC  WBC 4.0 - 10.5 K/uL 4.4  4.9  9.3   Hemoglobin 13.0 - 17.0 g/dL 7.2  7.0  7.6   Hematocrit 39.0 - 52.0 % 22.2  21.3  22.6   Platelets 150 - 400 K/uL 230  199  180    .    Latest Ref Rng & Units 08/13/2022    3:32 AM 08/12/2022    2:21 AM 08/11/2022    5:41 AM  CMP  Glucose 70 - 99 mg/dL 952  77  75   BUN 8 - 23 mg/dL <5  <5  <5   Creatinine 0.61 - 1.24 mg/dL 8.41  3.24  4.01   Sodium 135 - 145 mmol/L 141  142  139   Potassium 3.5 - 5.1 mmol/L 4.6  4.0  3.1   Chloride 98 - 111 mmol/L 115  115  111   CO2 22 - 32 mmol/L 20  20  20    Calcium 8.9 - 10.3 mg/dL 7.2  7.3  7.2      0/27/2536 Bone Marrow Biopsy:    RADIOGRAPHIC STUDIES: I have personally reviewed the radiological images as listed and agreed with the findings in the report. VAS [place] UPPER EXTREMITY VENOUS DUPLEX  Result Date: 08/10/2022 UPPER VENOUS STUDY  Patient Name:  TRON BRENDEN  Date of Exam:   08/10/2022 Medical Rec #: [id number]            Accession #:    [id number] Date of Birth: 1940/07/19             Patient Gender: M Patient Age:   44 years Exam Location:  [facility] Procedure:      VAS [place] UPPER EXTREMITY VENOUS DUPLEX Referring Phys: RIPUDEEP RAI --------------------------------------------------------------------------------  Indications: Edema Risk Factors: None identified. Limitations:  Poor ultrasound/tissue interface, line and bandages. Comparison Study: No prior studies. Performing Technologist: Chanda Busing RVT  Examination Guidelines: A complete evaluation includes B-mode imaging, spectral Doppler, color Doppler, and power Doppler as needed of all accessible portions of each vessel. Bilateral testing is considered an integral part of a complete examination. Limited examinations for reoccurring indications may be performed as noted.  Right Findings: +----------+------------+---------+-----------+----------+-------+ RIGHT     CompressiblePhasicitySpontaneousPropertiesSummary +----------+------------+---------+-----------+----------+-------+ IJV           Full       Yes       Yes                      +----------+------------+---------+-----------+----------+-------+ Subclavian    Full       Yes       Yes                      +----------+------------+---------+-----------+----------+-------+  Axillary      Full       Yes       Yes                      +----------+------------+---------+-----------+----------+-------+ Brachial      Full                                          +----------+------------+---------+-----------+----------+-------+ Radial        Full                                          +----------+------------+---------+-----------+----------+-------+ Ulnar         Full                                          +----------+------------+---------+-----------+----------+-------+ Cephalic      Full                                          +----------+------------+---------+-----------+----------+-------+ Basilic       Full                                          +----------+------------+---------+-----------+----------+-------+  Left Findings: +----------+------------+---------+-----------+----------+-------+ LEFT      CompressiblePhasicitySpontaneousPropertiesSummary  +----------+------------+---------+-----------+----------+-------+ Subclavian    Full       Yes       Yes                      +----------+------------+---------+-----------+----------+-------+  Summary:  Right: No evidence of deep vein thrombosis in the upper extremity. No evidence of superficial vein thrombosis in the upper extremity.  Left: No evidence of thrombosis in the subclavian.  *See table(s) above for measurements and observations.  Diagnosing physician: Coral Else MD Electronically signed by Coral Else MD on 08/10/2022 at 5:49:11 PM.    Final    DG CHEST PORT 1 VIEW  Result Date: 08/09/2022 CLINICAL DATA:  Wheezing noted after sigmoidoscopy procedure today. Dyspnea. EXAM: PORTABLE CHEST 1 VIEW COMPARISON:  08/08/2022 FINDINGS: Shallow inspiration. Heart size and pulmonary vascularity are normal for technique. Right PICC line with tip over the low SVC region. No pneumothorax. Interval development of a focal area of consolidation in the left lower lung. This may represent pneumonia, aspiration, or atelectasis. Right lung is clear. No pleural effusions. Calcified and tortuous aorta. Degenerative changes in the spine and shoulders. IMPRESSION: New left lower lung consolidation. Electronically Signed   By: Burman Nieves M.D.   On: 08/09/2022 18:15   DG CHEST PORT 1 VIEW  Result Date: 08/08/2022 CLINICAL DATA:  PICC line placement. EXAM: PORTABLE CHEST 1 VIEW COMPARISON:  07/30/2022 FINDINGS: Lungs are clear. Cardiopericardial silhouette is at upper limits of normal for size. Right PICC line is new in the interval with tip overlying the mid SVC level. Telemetry leads overlie the chest. IMPRESSION: Right PICC line tip overlies the mid SVC level. Electronically Signed   By: Jamison Oka.D.  On: 08/08/2022 09:43   [place] EKG SITE RITE  Result Date: 08/07/2022 If Site Rite image not attached, placement could not be confirmed due to current cardiac rhythm.  DG Abd 1 View  Result Date:  08/06/2022 CLINICAL DATA:  Ileus EXAM: ABDOMEN - 1 VIEW COMPARISON:  Radiographs 08/04/2022 FINDINGS: Decreased gaseous distention of the colon compared to 08/04/2022. No definite small bowel dilation. Postoperative changes both femurs. IMPRESSION: Decreased gaseous distention of the colon compared with 08/04/2022. Electronically Signed   By: Minerva Fester M.D.   On: 08/06/2022 20:48   DG Abd 1 View  Result Date: 08/04/2022 CLINICAL DATA:  Pseudo-obstruction of colon. EXAM: ABDOMEN - 1 VIEW COMPARISON:  August 02, 2022. FINDINGS: There appears to be continued gaseous distention of the colon, although it appears to be slightly decreased compared to prior exam. No small bowel dilatation is noted. IMPRESSION: Slightly decreased gaseous distention of the colon suggesting mildly improved ileus. Electronically Signed   By: Lupita Raider M.D.   On: 08/04/2022 12:36   DG Abd 1 View  Result Date: 08/02/2022 CLINICAL DATA:  Evaluate ileus EXAM: ABDOMEN - 1 VIEW COMPARISON:  07/30/2022 FINDINGS: Persistent gaseous dilatation of the colon is noted. Cecum is dilated to 10.6 cm. No free air is seen. Postsurgical changes in the proximal femurs are seen. IMPRESSION: Gaseous dilatation of the colon most consistent with a colonic ileus. Electronically Signed   By: Alcide Clever M.D.   On: 08/02/2022 19:56   DG Chest Port 1 View  Result Date: 07/30/2022 CLINICAL DATA:  Shortness of breath. EXAM: PORTABLE CHEST 1 VIEW COMPARISON:  Chest radiograph dated Jul 29, 2022 FINDINGS: The heart size and mediastinal contours are within normal limits. Both lungs are clear. Elevation of the left hemidiaphragm with distended bowel loops in the left upper quadrant. The visualized skeletal structures are unremarkable. IMPRESSION: 1. No acute cardiopulmonary process 2. Elevation of the left hemidiaphragm with distended bowel loops in the left upper quadrant. Electronically Signed   By: Larose Hires D.O.   On: 07/30/2022 22:40   DG KNEE 3  VIEW RIGHT  Result Date: 07/30/2022 CLINICAL DATA:  Right knee pain. EXAM: RIGHT KNEE - 3 VIEW COMPARISON:  Right femur radiographs 06/04/2022 FINDINGS: There is diffuse decreased bone mineralization. Severe patellofemoral joint space narrowing and peripheral osteophytosis. No joint effusion. Moderate posterior proximal tibial degenerative spurring. Moderate to severe medial joint space narrowing and peripheral osteophytosis. Minimal peripheral lateral chronic degenerative spurring without significant joint space narrowing. A calcified density measuring 6 mm again overlies the distal anterior lateral thigh soft tissues, nonspecific. No acute fracture is seen. No dislocation. Partial visualization of femoral intramedullary nail. 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 Electronically Signed   By: Neita Garnet M.D.   On: 07/30/2022 17:59   VAS [place] LOWER EXTREMITY VENOUS (DVT)  Result Date: 07/30/2022  Lower Venous DVT Study Patient Name:  DREQUAN Hernandez  Date of Exam:   07/30/2022 Medical Rec #: [id number]            Accession #:    [id number] Date of Birth: [birth year]             Patient Gender: M Patient Age:   10 years Exam Location:  [facility] Procedure:      VAS [place] LOWER EXTREMITY VENOUS (DVT) Referring Phys: Jon Billings REGALADO --------------------------------------------------------------------------------  Indications: Edema.  Risk Factors: Confirmed PE. Anticoagulation: Heparin. Limitations: Body habitus, poor ultrasound/tissue interface and patient positioning. Comparison  Study: No prior studies. Performing Technologist: Chanda Busing RVT  Examination Guidelines: A complete evaluation includes B-mode imaging, spectral Doppler, color Doppler, and power Doppler as needed of all accessible portions of each vessel. Bilateral testing is considered an integral part of a complete examination. Limited examinations for reoccurring indications may be  performed as noted. The reflux portion of the exam is performed with the patient in reverse Trendelenburg.  +---------+---------------+---------+-----------+----------+-------------------+ RIGHT    CompressibilityPhasicitySpontaneityPropertiesThrombus Aging      +---------+---------------+---------+-----------+----------+-------------------+ CFV      Full           Yes      Yes                                      +---------+---------------+---------+-----------+----------+-------------------+ SFJ      Full                                                             +---------+---------------+---------+-----------+----------+-------------------+ FV Prox  Full                                                             +---------+---------------+---------+-----------+----------+-------------------+ FV Mid                  Yes      Yes                                      +---------+---------------+---------+-----------+----------+-------------------+ FV Distal               Yes      Yes                                      +---------+---------------+---------+-----------+----------+-------------------+ PFV      Full                                                             +---------+---------------+---------+-----------+----------+-------------------+ POP      Full           Yes      Yes                                      +---------+---------------+---------+-----------+----------+-------------------+ PTV      Full                                                             +---------+---------------+---------+-----------+----------+-------------------+  PERO                                                  Not well visualized +---------+---------------+---------+-----------+----------+-------------------+   +---------+---------------+---------+-----------+----------+-------------------+ LEFT      CompressibilityPhasicitySpontaneityPropertiesThrombus Aging      +---------+---------------+---------+-----------+----------+-------------------+ CFV      Partial        Yes      Yes                  Acute               +---------+---------------+---------+-----------+----------+-------------------+ SFJ      Full                                                             +---------+---------------+---------+-----------+----------+-------------------+ FV Prox  Partial        Yes      No                   Acute               +---------+---------------+---------+-----------+----------+-------------------+ FV Mid   Partial        Yes      Yes                  Age Indeterminate   +---------+---------------+---------+-----------+----------+-------------------+ FV Distal                                             Not well visualized +---------+---------------+---------+-----------+----------+-------------------+ PFV      Full           Yes      Yes                                      +---------+---------------+---------+-----------+----------+-------------------+ POP      Partial        Yes      Yes                  Age Indeterminate   +---------+---------------+---------+-----------+----------+-------------------+ PTV      Full                                                             +---------+---------------+---------+-----------+----------+-------------------+ PERO                                                  Not well visualized +---------+---------------+---------+-----------+----------+-------------------+ Thrombus located in the common femoral vein is noted to be in the distal segment.    Summary: RIGHT: - There is no evidence of deep vein thrombosis in the lower extremity. However, portions of this  examination were limited- see technologist comments above.  - No cystic structure found in the popliteal fossa.  LEFT: - Findings consistent  with acute deep vein thrombosis involving the left common femoral vein, and left femoral vein. - Findings consistent with age indeterminate deep vein thrombosis involving the left popliteal vein. - No cystic structure found in the popliteal fossa.  *See table(s) above for measurements and observations. Electronically signed by Sherald Hess MD on 07/30/2022 at 4:42:31 PM.    Final    ECHOCARDIOGRAM COMPLETE  Result Date: 07/30/2022    ECHOCARDIOGRAM REPORT   Patient Name:   Frank Hernandez Date of Exam: 07/30/2022 Medical Rec #:  [id number]           Height:       69.0 in Accession #:    [id number]          Weight:       199.0 lb Date of Birth:  06/15/1940            BSA:          2.062 m Patient Age:    82 years            BP:           156/65 mmHg Patient Gender: M                   HR:           74 bpm. Exam Location:  Inpatient Procedure: 2D Echo, Color Doppler, Cardiac Doppler and Intracardiac            Opacification Agent Indications:    Pulmonary Embolus  History:        Patient has prior history of Echocardiogram examinations, most                 recent 04/22/2022. CKD, Signs/Symptoms:Syncope; Risk                 Factors:Hypertension.  Sonographer:    Milbert Coulter Referring Phys: [id number] DAVID MANUEL ORTIZ  Sonographer Comments: Technically difficult study due to poor echo windows, suboptimal parasternal window, suboptimal apical window and no subcostal window. Image acquisition challenging due to patient body habitus and Image acquisition challenging due to uncooperative patient. IMPRESSIONS  1. Left ventricular ejection fraction, by estimation, is 65 to 70%. The left ventricle has normal function. The left ventricle has no regional wall motion abnormalities. Left ventricular diastolic parameters are consistent with Grade I diastolic dysfunction (impaired relaxation).  2. Right ventricular systolic function is normal. The right ventricular size is normal.  3. The mitral valve is normal in  structure. Trivial mitral valve regurgitation. No evidence of mitral stenosis.  4. The aortic valve is normal in structure. Aortic valve regurgitation is trivial. No aortic stenosis is present.  5. The inferior vena cava is normal in size with greater than 50% respiratory variability, suggesting right atrial pressure of 3 mmHg. FINDINGS  Left Ventricle: Left ventricular ejection fraction, by estimation, is 65 to 70%. The left ventricle has normal function. The left ventricle has no regional wall motion abnormalities. Definity contrast agent was given IV to delineate the left ventricular  endocardial borders. The left ventricular internal cavity size was normal in size. There is no left ventricular hypertrophy. Left ventricular diastolic parameters are consistent with Grade I diastolic dysfunction (impaired relaxation). Right Ventricle: The right ventricular size is normal. No increase in right ventricular wall thickness. Right ventricular systolic function is normal. Left Atrium:  Left atrial size was normal in size. Right Atrium: Right atrial size was normal in size. Pericardium: There is no evidence of pericardial effusion. Mitral Valve: The mitral valve is normal in structure. Trivial mitral valve regurgitation. No evidence of mitral valve stenosis. Tricuspid Valve: The tricuspid valve is normal in structure. Tricuspid valve regurgitation is mild . No evidence of tricuspid stenosis. Aortic Valve: The aortic valve is normal in structure. Aortic valve regurgitation is trivial. Aortic regurgitation PHT measures 434 msec. No aortic stenosis is present. Aortic valve mean gradient measures 6.0 mmHg. Aortic valve peak gradient measures 10.8 mmHg. Aortic valve area, by VTI measures 2.35 cm. Pulmonic Valve: The pulmonic valve was normal in structure. Pulmonic valve regurgitation is not visualized. No evidence of pulmonic stenosis. Aorta: The aortic root is normal in size and structure. Venous: The inferior vena cava is  normal in size with greater than 50% respiratory variability, suggesting right atrial pressure of 3 mmHg. IAS/Shunts: No atrial level shunt detected by color flow Doppler.  LEFT VENTRICLE PLAX 2D LVIDd:         3.30 cm LVIDs:         2.60 cm LV PW:         1.50 cm LV IVS:        1.60 cm LVOT diam:     2.20 cm LV SV:         64 LV SV Index:   31 LVOT Area:     3.80 cm  RIGHT VENTRICLE TAPSE (M-mode): 1.8 cm AORTIC VALVE AV Area (Vmax):    2.53 cm AV Area (Vmean):   2.59 cm AV Area (VTI):     2.35 cm AV Vmax:           164.00 cm/s AV Vmean:          107.000 cm/s AV VTI:            0.271 m AV Peak Grad:      10.8 mmHg AV Mean Grad:      6.0 mmHg LVOT Vmax:         109.20 cm/s LVOT Vmean:        72.850 cm/s LVOT VTI:          0.167 m LVOT/AV VTI ratio: 0.62 AI PHT:            434 msec  AORTA Ao Root diam: 4.30 cm TRICUSPID VALVE TR Peak grad:   42.0 mmHg TR Vmax:        324.00 cm/s  SHUNTS Systemic VTI:  0.17 m Systemic Diam: 2.20 cm Chilton Si MD Electronically signed by Chilton Si MD Signature Date/Time: 07/30/2022/3:47:49 PM    Final    DG Abd 1 View  Result Date: 07/30/2022 CLINICAL DATA:  Small bowel obstruction. EXAM: ABDOMEN - 1 VIEW COMPARISON:  Jul 29, 2022. FINDINGS: Stable marked gaseous distention of colon is noted. No definite small bowel dilatation is noted. Distal tip of nasogastric tube is seen in expected position of proximal stomach. IMPRESSION: Stable gaseous distention of colon most consistent with ileus or distal colonic obstruction. Electronically Signed   By: Lupita Raider M.D.   On: 07/30/2022 09:04   DG Ankle Complete Right  Result Date: 07/29/2022 CLINICAL DATA:  Ankle wound. EXAM: RIGHT ANKLE - COMPLETE 3+ VIEW COMPARISON:  None Available. FINDINGS: There is no evidence of fracture, dislocation, or joint effusion. There is no evidence of arthropathy or other focal bone abnormality. Vascular calcifications are noted. No definite  lytic destruction is noted. No definite  soft tissue wound is noted. IMPRESSION: No acute abnormality. Electronically Signed   By: Lupita Raider M.D.   On: 07/29/2022 10:39   CT Angio Chest PE W and/or Wo Contrast  Result Date: 07/29/2022 CLINICAL DATA:  Dyspnea, shortness of breath. 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 EXAM: CT ANGIOGRAPHY CHEST CT ABDOMEN AND PELVIS WITH CONTRAST TECHNIQUE: Multidetector CT imaging of the chest was performed using the standard protocol during bolus administration of intravenous contrast. Multiplanar CT image reconstructions and MIPs were obtained to evaluate the vascular anatomy. Multidetector CT imaging of the abdomen and pelvis was performed using the standard protocol during bolus administration of intravenous contrast. RADIATION DOSE REDUCTION: This exam was performed according to the departmental dose-optimization program which includes automated exposure control, adjustment of the mA and/or kV according to patient size and/or use of iterative reconstruction technique. CONTRAST:  OMNIPAQUE IOHEXOL 350 MG/ML SOLN COMPARISON:  Standard CT with contrast 04/14/2022. Chest x-ray earlier 07/29/2018. PET-CT scan 05/20/2018 FINDINGS: CTA CHEST FINDINGS Cardiovascular: There is some mild breathing motion throughout the examination. Heart is nonenlarged. No pericardial effusion. Coronary artery calcifications are seen. The thoracic aorta has a overall normal caliber with some scattered atherosclerotic plaque. There is some pulsation artifact along the ascending aorta. There is some enlargement of the main pulmonary artery. Please correlate for pulmonary artery hypertension. There is a left lower lobe pulmonary embolism along segmental vessel. No other substantial pulmonary emboli are identified. Minimal clot burden. Mediastinum/Nodes: No specific abnormal lymph node enlargement identified in the axillary regions, hilum or mediastinum. Preserved thyroid gland. Enteric tube along the  esophagus with tip extending into the upper stomach. Lungs/Pleura: Breathing motion. No consolidation, pneumothorax or effusion. Mild linear opacity at the bases likely scar or atelectasis. Musculoskeletal: Curvature and degenerative changes along the spine. Review of the MIP images confirms the above findings. CT ABDOMEN and PELVIS FINDINGS Hepatobiliary: Stable small hepatic benign-appearing cystic lesions. No specific imaging follow-up. Gallbladder is contracted. Patent portal vein. Pancreas: Mild global pancreatic atrophy.  No pancreatic mass. Spleen: Normal in size without focal abnormality. Adrenals/Urinary Tract: The adrenal glands are preserved. 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 No specific imaging follow-up. No collecting system dilatation. The ureters have normal course and caliber extending down to the bladder. Bladder has preserved contour but a portion of the right side of the bladder extends into the right separate inguinal hernia. Stomach/Bowel: On this non oral contrast exam the stomach is nondilated. Small bowel is nondilated. Large bowel is dilated. Right-sided has dimensions approaching up to 10 cm in diameter. There are air-fluid levels. The transverse colon is slightly less dilated as is the descending colon with some scattered diverticula along the descending and sigmoid colon. The previous left inguinal hernia which contain only fat, now involves the sigmoid colon and there is a transition from the dilatation to the nondilated at the level of the inguinal hernia. Please correlate with reduced stability. There is a separate large right inguinal hernia which was seen previous involving mostly the bladder but also significant small bowel and mesenteric fat. Again the small bowel is nondilated. Vascular/Lymphatic: Aortic atherosclerosis. No enlarged abdominal or pelvic lymph nodes. Reproductive: Prostate is unremarkable. Other: Anasarca.  No free intra-abdominal air or  free fluid. Musculoskeletal: Streak artifact related to the patient's bilateral dynamic hip screws. Degenerative changes of the spine and pelvis. Once again there are scattered lytic lesions along the pelvis and lumbar spine  as previously described. Review of the MIP images confirms the above findings. Critical Value/emergent results were called by telephone at the time of interpretation on 07/29/2022 at 7:34 am to provider Vivien Rossetti , who verbally acknowledged these results. IMPRESSION: Left lower lobe segmental pulmonary embolism. Overall minimal clot burden. Breathing motion. Dilated colon with air-fluid level seen and scattered colonic diverticula. Transition seen in the area of the new left inguinal hernia involving the sigmoid colon. Please correlate for reduce ability. No pneumatosis, free air or fluid. Small bowel and stomach are nondilated but there is an enteric tube. Separate large known right inguinal hernia involving small bowel and significant portion of the urinary bladder. Enteric tube in the stomach. Lytic bone lesions consistent with known history of myeloma. Electronically Signed   By: Karen Kays M.D.   On: 07/29/2022 10:38   CT ABDOMEN PELVIS W CONTRAST  Result Date: 07/29/2022 CLINICAL DATA:  Dyspnea, shortness of breath. 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 EXAM: CT ANGIOGRAPHY CHEST CT ABDOMEN AND PELVIS WITH CONTRAST TECHNIQUE: Multidetector CT imaging of the chest was performed using the standard protocol during bolus administration of intravenous contrast. Multiplanar CT image reconstructions and MIPs were obtained to evaluate the vascular anatomy. Multidetector CT imaging of the abdomen and pelvis was performed using the standard protocol during bolus administration of intravenous contrast. RADIATION DOSE REDUCTION: This exam was performed according to the departmental dose-optimization program which includes automated exposure control, adjustment of the  mA and/or kV according to patient size and/or use of iterative reconstruction technique. CONTRAST:  OMNIPAQUE IOHEXOL 350 MG/ML SOLN COMPARISON:  Standard CT with contrast 04/14/2022. Chest x-ray earlier 07/29/2018. PET-CT scan 05/20/2018 FINDINGS: CTA CHEST FINDINGS Cardiovascular: There is some mild breathing motion throughout the examination. Heart is nonenlarged. No pericardial effusion. Coronary artery calcifications are seen. The thoracic aorta has a overall normal caliber with some scattered atherosclerotic plaque. There is some pulsation artifact along the ascending aorta. There is some enlargement of the main pulmonary artery. Please correlate for pulmonary artery hypertension. There is a left lower lobe pulmonary embolism along segmental vessel. No other substantial pulmonary emboli are identified. Minimal clot burden. Mediastinum/Nodes: No specific abnormal lymph node enlargement identified in the axillary regions, hilum or mediastinum. Preserved thyroid gland. Enteric tube along the esophagus with tip extending into the upper stomach. Lungs/Pleura: Breathing motion. No consolidation, pneumothorax or effusion. Mild linear opacity at the bases likely scar or atelectasis. Musculoskeletal: Curvature and degenerative changes along the spine. Review of the MIP images confirms the above findings. CT ABDOMEN and PELVIS FINDINGS Hepatobiliary: Stable small hepatic benign-appearing cystic lesions. No specific imaging follow-up. Gallbladder is contracted. Patent portal vein. Pancreas: Mild global pancreatic atrophy.  No pancreatic mass. Spleen: Normal in size without focal abnormality. Adrenals/Urinary Tract: The adrenal glands are preserved. 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 No specific imaging follow-up. No collecting system dilatation. The ureters have normal course and caliber extending down to the bladder. Bladder has preserved contour but a portion of the right side of the  bladder extends into the right separate inguinal hernia. Stomach/Bowel: On this non oral contrast exam the stomach is nondilated. Small bowel is nondilated. Large bowel is dilated. Right-sided has dimensions approaching up to 10 cm in diameter. There are air-fluid levels. The transverse colon is slightly less dilated as is the descending colon with some scattered diverticula along the descending and sigmoid colon. The previous left inguinal hernia which contain only fat, now involves the  sigmoid colon and there is a transition from the dilatation to the nondilated at the level of the inguinal hernia. Please correlate with reduced stability. There is a separate large right inguinal hernia which was seen previous involving mostly the bladder but also significant small bowel and mesenteric fat. Again the small bowel is nondilated. Vascular/Lymphatic: Aortic atherosclerosis. No enlarged abdominal or pelvic lymph nodes. Reproductive: Prostate is unremarkable. Other: Anasarca.  No free intra-abdominal air or free fluid. Musculoskeletal: Streak artifact related to the patient's bilateral dynamic hip screws. Degenerative changes of the spine and pelvis. Once again there are scattered lytic lesions along the pelvis and lumbar spine as previously described. Review of the MIP images confirms the above findings. Critical Value/emergent results were called by telephone at the time of interpretation on 07/29/2022 at 7:34 am to provider Vivien Rossetti , who verbally acknowledged these results. IMPRESSION: Left lower lobe segmental pulmonary embolism. Overall minimal clot burden. Breathing motion. Dilated colon with air-fluid level seen and scattered colonic diverticula. Transition seen in the area of the new left inguinal hernia involving the sigmoid colon. Please correlate for reduce ability. No pneumatosis, free air or fluid. Small bowel and stomach are nondilated but there is an enteric tube. Separate large known right  inguinal hernia involving small bowel and significant portion of the urinary bladder. Enteric tube in the stomach. Lytic bone lesions consistent with known history of myeloma. Electronically Signed   By: Karen Kays M.D.   On: 07/29/2022 10:38   DG Abd 1 View  Result Date: 07/29/2022 CLINICAL DATA:  NG tube placement EXAM: ABDOMEN - 1 VIEW COMPARISON:  Same day CT abdomen/pelvis FINDINGS: The enteric catheter tip and sidehole project over the expected location of the stomach. There is marked gaseous distention of the large bowel as seen on same-day CT. There is no definite free intraperitoneal air, within the confines of supine technique. IMPRESSION: Enteric catheter tip and sidehole project over the stomach. Electronically Signed   By: Lesia Hausen M.D.   On: 07/29/2022 10:34   DG Chest 2 View  Result Date: 07/29/2022 CLINICAL DATA:  shortness of breath, wheezing EXAM: CHEST - 2 VIEW COMPARISON:  PET-CT March 21, 24. FINDINGS: The heart size and mediastinal contours are within normal limits. Both lungs are clear. No visible pleural effusions or pneumothorax. The visualized skeletal structures are unremarkable. Gaseous distension of visualized bowel loops in the upper abdomen. Recommend dedicated KUB to further evaluate. IMPRESSION: 1. No active cardiopulmonary disease. 2. Gaseous distension of visualized bowel loops in the upper abdomen. Recommend dedicated KUB to further evaluate. Electronically Signed   By: Feliberto Harts M.D.   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 M spike of 1.2g/dl and elevated Kappa Light chains  BM Bx with 30% abnormal plasma cell and AL Amyloidosis. M spike is down to 0.3g/dl Was on Dara/velcade /dex and Velcade was held due to poor po intake.  #2 Subacute pathologic intertrochanteric fracture of his right femur due to  myeloma s/p IM nailing Imaging shows lytic lesion in the area confirming the pathologic nature of the fracture. Patient has not had any trauma or falls leading to the fracture. CT chest abdomen pelvis shows multiple other bony lytic lesions without any other clear source of a primary tumor.    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 Bx from area  of pathologic rt hip fracture confirmed daignosis of multiple myeloma.   #4 normocytic anemia.  Hemoglobin is varying between 8-9.  Likely from metastatic malignancy/myeloma  #5 Acute PE and left femoral DVT on anticoag  # 6 Possible bowel obstruction - Ogilvie's pseudoobstruction vs related to inguinal hernia  # 7 Persistent Large Volume diarrhea -- unclear etiology but concerning for c diff though initial test neg. Unlikely from Daratumumab -- not known to cause much diarrhea and last dose was 2 weeks ago - Another possible concern could be AL Amyloidosis of the bowels-- did have AL Amyloid on the BM Bx  # 8 Multiple electrolyte deficiencies due to diarrhea  PLAN:  -Discussed lab results on 07/09/2022 in detail with patient. CBC showed WBC of 3.1K, hemoglobin of 9.5, and platelets of 252K. -potassium level low at 2.8 due to low food-intake -Calcium low at 7.1 -Monoclonal protein 0.4 -Discussed results of CT head scan on 07/01/2022 which revealed negative results -will continue to minimize Acyclovir and steroids to improve potassium levels -continue Daratumumab -Will continue to lower Dexamethasone to improve leg swelling -will order Remeron to improve depression symptoms and improve appetite -advised patient to continue to move around the home and outdoors improve depression -recommend patient to optimize his nutrition by increasing his food intake. Recommend patient to consume at least 3 meals a day with potassium-rich foods -will refill Ondansetron to prevent nausea and vomiting -continue vitamin D supplements -recommend  dairy/milk products to improve vitamin D stores -Continue at-home physical therapy -continue to follow up with orthopedics for evaluation and management of hip  -patient does regularly receive wound care at home twice a week -Patient shall continue to follow with podiatry for evaluation and management of LE ulcer -Pt shall continue to have his chronic kidney disease monitored  PLAN -Myeloma labs showed good response -daratumumab last dose 2 weeks ago unlikel cause of diarrhea. -appreciate input from hospital medicine, GI and surgery teams. -continue to hold Myeloma treatment while in the hospital -patients and his wife questions were answered in details   FOLLOW-UP: ***  The total time spent in the appointment was *** minutes* .  All of the patient's questions were answered with apparent satisfaction. The patient knows to call the clinic with any problems, questions or concerns.   Wyvonnia Lora MD MS AAHIVMS [facility] Hematology/Oncology Physician [facility]  .*Total Encounter Time as defined by the Centers for Medicare and Medicaid Services includes, in addition to the face-to-face time of a patient visit (documented in the note above) non-face-to-face time: obtaining and reviewing outside history, ordering and reviewing medications, tests or procedures, care coordination (communications with other health care professionals or caregivers) and documentation in the medical record.     Alben Deeds Teague,acting as a Neurosurgeon for Wyvonnia Lora, MD.,have documented all relevant documentation on the behalf of Wyvonnia Lora, MD,as directed by  Wyvonnia Lora, MD while in the presence of Wyvonnia Lora, MD.  ***

## 2022-08-14 DIAGNOSIS — K56609 Unspecified intestinal obstruction, unspecified as to partial versus complete obstruction: Secondary | ICD-10-CM | POA: Diagnosis not present

## 2022-08-14 LAB — GLUCOSE, CAPILLARY
Glucose-Capillary: 140 mg/dL — ABNORMAL HIGH (ref 70–99)
Glucose-Capillary: 60 mg/dL — ABNORMAL LOW (ref 70–99)
Glucose-Capillary: 73 mg/dL (ref 70–99)
Glucose-Capillary: 77 mg/dL (ref 70–99)
Glucose-Capillary: 92 mg/dL (ref 70–99)
Glucose-Capillary: 94 mg/dL (ref 70–99)

## 2022-08-14 LAB — BASIC METABOLIC PANEL
Anion gap: 8 (ref 5–15)
BUN: <5 mg/dL — ABNORMAL LOW (ref 8–23)
CO2: 19 mmol/L — ABNORMAL LOW (ref 22–32)
Calcium: 7.1 mg/dL — ABNORMAL LOW (ref 8.9–10.3)
Chloride: 112 mmol/L — ABNORMAL HIGH (ref 98–111)
Creatinine, Ser: 1.08 mg/dL (ref 0.61–1.24)
GFR, Estimated: >60 mL/min (ref >60)
Glucose, Bld: 62 mg/dL — ABNORMAL LOW (ref 70–99)
Potassium: 4.2 mmol/L (ref 3.5–5.1)
Sodium: 139 mmol/L (ref 135–145)

## 2022-08-14 LAB — CBC
HCT: 21.9 % — ABNORMAL LOW (ref 39.0–52.0)
Hemoglobin: 7.2 g/dL — ABNORMAL LOW (ref 13.0–17.0)
MCH: 31.4 pg (ref 26.0–34.0)
MCHC: 32.9 g/dL (ref 30.0–36.0)
MCV: 95.6 fL (ref 80.0–100.0)
Platelets: 249 10*3/uL (ref 150–400)
RBC: 2.29 MIL/uL — ABNORMAL LOW (ref 4.22–5.81)
RDW: 15.6 % — ABNORMAL HIGH (ref 11.5–15.5)
WBC: 5 10*3/uL (ref 4.0–10.5)
nRBC: 0 % (ref 0.0–0.2)

## 2022-08-14 LAB — MAGNESIUM: Magnesium: 1.6 mg/dL — ABNORMAL LOW (ref 1.7–2.4)

## 2022-08-14 LAB — PHOSPHORUS: Phosphorus: 1.7 mg/dL — ABNORMAL LOW (ref 2.5–4.6)

## 2022-08-14 MED ORDER — MAGNESIUM OXIDE -MG SUPPLEMENT 400 (240 MG) MG PO TABS
400.0000 mg | ORAL_TABLET | Freq: Two times a day (BID) | ORAL | Status: AC
  Administered 2022-08-14 (×2): 400 mg via ORAL
  Filled 2022-08-14 (×2): qty 1

## 2022-08-14 MED ORDER — K PHOS MONO-SOD PHOS DI & MONO 155-852-130 MG PO TABS
500.0000 mg | ORAL_TABLET | Freq: Two times a day (BID) | ORAL | Status: AC
  Administered 2022-08-14 (×2): 500 mg via ORAL
  Filled 2022-08-14 (×2): qty 2

## 2022-08-14 NOTE — Progress Notes (Signed)
[facility] PROGRESS NOTE    Progress Note  Frank Hernandez  ZOX:[id number] DOB: 04/29/1940 DOA: 07/29/2022 PCP: [name] (Inactive)     Brief Narrative:   Frank Hernandez is an 82 y.o. male past medical history of essential hypertension, chronic kidney stage III AA pathologic fracture of the femoral head, multiple myeloma comes into the ED with wheezing and rhonchi CTA of the chest showed subsegmental PE started on IV Lovenox.  CT scan of the abdomen pelvis showed dilated colon with air-fluid level seen   Assessment/Plan:   Acute left lower lobe segmental pulmonary embolism/acute left lower extremity DVT. 2D echo showed no RV strain. Currently on  Lovenox. PT evaluated the patient, will need skilled nursing facility probably on 08/16/2022.  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 NG tube was placed which has now been removed. Surgery on board as well as GI. GI more concerned about Ogilvie syndrome. On a soft diet.  Diarrhea: Infectious workup was positive for enteropathic E. coli for which she has completed 3-day course. C. difficile negative. Dr. Izora Ribas relate that is unlikely due to daratumumab last week was 2 days prior 3 doses of albumin in 08/06/2022. 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 Flex sig on 08/09/2022 showed edematous mucosa of the sigmoid descending and transverse colon biopsies were taken. Her stools are slowed down GI has signed off.  Acute respiratory failure with hypoxia possibly due to aspiration pneumonia: She is to complete a 5-day course of IV Unasyn. She has been weaned to room air.  Normal anion gap metabolic acidosis in the setting of GI losses: Nephrology has been consulted and no sign off.  Hypokalemia/hypomagnesemia/hypophosphatemia: Replete orally try to keep potassium greater than 4 magnesium greater than 2 phosphorus greater than  3.  Hypocalcemia: Continue calcium supplements twice a day.  Bilateral lower extremity cellulitis: She completed 5-day course of antibiotics.  Prolonged QTc: Try to replete electrolytes to keep potassium greater than 4 magnesium greater than 2.  Acute kidney injury on chronic kidney disease stage IIIa: With a baseline creatinine of around 1.3 on admission 1.8. Improved with IV fluids.  Normocytic anemia: Likely due to myeloma currently asymptomatic. No signs of overt bleeding.  Knee pain: Chest x-ray shows severe osteoarthritis follow-up with PCP as an outpatient.  Multiple myeloma: Follow-up with oncology as an outpatient.  Stage I significant results are present on admission: RN Pressure Injury Documentation: Pressure Injury 03/29/22 Buttocks Right Stage 1 -  Intact skin with non-blanchable redness of a localized area usually over a bony prominence. (Active)  03/29/22 2140  Location: Buttocks  Location Orientation: Right  Staging: Stage 1 -  Intact skin with non-blanchable redness of a localized area usually over a bony prominence.  Wound Description (Comments):   Present on Admission: Yes     Pressure Injury 05/28/22 Heel Right Stage 3 -  Full thickness tissue loss. Subcutaneous fat may be visible but bone, tendon or muscle are NOT exposed. Open wound, broken from pressure & dry skin (Active)  05/28/22 1400  Location: Heel  Location Orientation: Right  Staging: Stage 3 -  Full thickness tissue loss. Subcutaneous fat may be visible but bone, tendon or muscle are NOT exposed.  Wound Description (Comments): Open wound, broken from pressure & dry skin  Present on Admission: Yes  Dressing Type Gauze (Comment) 08/13/22 2200     Pressure Injury 08/08/22 Buttocks Right;Left Deep Tissue Pressure  Injury - Purple or maroon localized area of discolored intact skin or blood-filled blister due to damage of underlying soft tissue from pressure and/or shear. (Active)  08/08/22 1241   Location: Buttocks  Location Orientation: Right;Left  Staging: Deep Tissue Pressure Injury - Purple or maroon localized area of discolored intact skin or blood-filled blister due to damage of underlying soft tissue from pressure and/or shear.  Wound Description (Comments):   Present on Admission:   Dressing Type Foam - Lift dressing to assess site every shift 08/13/22 2200    DVT prophylaxis: lovenox Family Communication:none Status is: Inpatient Remains inpatient appropriate because: Acute kidney injury, Ogilvie syndrome, new PE    Code Status:     Code Status Orders  (From admission, onward)           Start     Ordered   07/29/22 1204  Full code  Continuous       Question:  By:  Answer:  Consent: discussion documented in EHR   07/29/22 1204           Code Status History     Date Active Date Inactive Code Status Order ID Comments User Context   05/28/2022 1110 06/01/2022 2315 Full Code [id number]  Maryln Gottron, MD ED   04/13/2022 1309 04/23/2022 0203 Full Code [id number]  Elease Etienne, MD ED   03/30/2022 0130 04/05/2022 2058 Full Code [id number]  Gery Pray, MD ED   06/10/2020 2243 06/25/2020 0330 Full Code [id number]  Kinsinger, De Blanch, MD ED   12/26/2015 1513 12/27/2015 2120 Full Code [id number]  Black, Lesle Chris, NP ED         IV Access:   Peripheral IV   Procedures and diagnostic studies:   No results found.   Medical Consultants:   None.   Subjective:    Frank Hernandez no complaints tolerating his diet.  Objective:    Vitals:   08/13/22 1334 08/13/22 2208 08/14/22 0602 08/14/22 0602  BP: 127/75 134/70 139/77 139/77  Pulse: 73 72 75 78  Resp: 19 17 17 17   Temp: 98.1 F (36.7 C) 98.4 F (36.9 C) 98.6 F (37 C) 98.6 F (37 C)  TempSrc: Oral Oral Oral Oral  SpO2: 100% 100% 100% 100%  Weight:      Height:       SpO2: 100 % O2 Flow Rate (L/min): 2 L/min FiO2 (%): 21 %   Intake/Output Summary (Last 24 hours) at  08/14/2022 0811 Last data filed at 08/14/2022 0600 Gross per 24 hour  Intake 480 ml  Output 2200 ml  Net -1720 ml   Filed Weights   07/29/22 0822 08/07/22 1455  Weight: 90.3 kg 101.2 kg    Exam: General exam: In no acute distress. Respiratory system: Good air movement and clear to auscultation. Cardiovascular system: S1 & S2 heard, RRR. No JVD. Gastrointestinal system: Abdomen is nondistended, soft and nontender.  Extremities: No pedal edema. Skin: No rashes, lesions or ulcers Psychiatry: Judgement and insight appear normal. Mood & affect appropriate.    Data Reviewed:    Labs: Basic Metabolic Panel: Recent Labs  Lab 08/09/22 0527 08/10/22 0531 08/11/22 0541 08/12/22 0221 08/13/22 0332 08/14/22 0309  NA 140 141 139 142 141  --   K 3.8 3.6 3.1* 4.0 4.6  --   CL 112* 114* 111 115* 115*  --   CO2 20* 19* 20* 20* 20*  --   GLUCOSE 91 93 75 77 112*  --   BUN <  5* <5* <5* <5* <5*  --   CREATININE 0.90 0.98 0.94 1.05 1.03  --   CALCIUM 6.5* 6.9* 7.2* 7.3* 7.2*  --   MG 1.6* 2.1 1.7 1.7 1.4* 1.6*  PHOS 2.1* 3.3 2.3* 2.3* 1.6* 1.7*   GFR Estimated Creatinine Clearance: 66 mL/min (by C-G formula based on SCr of 1.03 mg/dL). Liver Function Tests: Recent Labs  Lab 08/10/22 0531  ALBUMIN 2.5*   No results for input(s): "LIPASE", "AMYLASE" in the last 168 hours. No results for input(s): "AMMONIA" in the last 168 hours. Coagulation profile No results for input(s): "INR", "PROTIME" in the last 168 hours. COVID-19 Labs  No results for input(s): "DDIMER", "FERRITIN", "LDH", "CRP" in the last 72 hours.  Lab Results  Component Value Date   SARSCOV2NAA NEGATIVE 07/29/2022   SARSCOV2NAA NEGATIVE 03/23/2022   SARSCOV2NAA NEGATIVE 06/21/2020   SARSCOV2NAA POSITIVE (A) 06/20/2020    CBC: Recent Labs  Lab 08/08/22 1242 08/09/22 0527 08/10/22 0531 08/12/22 0221 08/13/22 0332  WBC 4.3 4.1 9.3 4.9 4.4  HGB 7.7* 7.5* 7.6* 7.0* 7.2*  HCT 23.0* 22.4* 22.6* 21.3* 22.2*   MCV 93.5 93.7 94.6 95.1 94.9  PLT 183 162 180 199 230   Cardiac Enzymes: No results for input(s): "CKTOTAL", "CKMB", "CKMBINDEX", "TROPONINI" in the last 168 hours. BNP (last 3 results) No results for input(s): "PROBNP" in the last 8760 hours. CBG: Recent Labs  Lab 08/13/22 1116 08/13/22 1713 08/14/22 0000 08/14/22 0601 08/14/22 0755  GLUCAP 74 127* 73 60* 94   D-Dimer: No results for input(s): "DDIMER" in the last 72 hours. Hgb A1c: No results for input(s): "HGBA1C" in the last 72 hours. Lipid Profile: No results for input(s): "CHOL", "HDL", "LDLCALC", "TRIG", "CHOLHDL", "LDLDIRECT" in the last 72 hours. Thyroid function studies: No results for input(s): "TSH", "T4TOTAL", "T3FREE", "THYROIDAB" in the last 72 hours.  Invalid input(s): "FREET3" Anemia work up: No results for input(s): "VITAMINB12", "FOLATE", "FERRITIN", "TIBC", "IRON", "RETICCTPCT" in the last 72 hours. Sepsis Labs: Recent Labs  Lab 08/09/22 0527 08/09/22 1812 08/10/22 0531 08/12/22 0221 08/13/22 0332  PROCALCITON  --  0.22  --   --   --   WBC 4.1  --  9.3 4.9 4.4   Microbiology Recent Results (from the past 240 hour(s))  C Difficile Quick Screen (NO PCR Reflex)     Status: None   Collection Time: 08/07/22  4:46 AM   Specimen: STOOL  Result Value Ref Range Status   C Diff antigen NEGATIVE NEGATIVE Final   C Diff toxin NEGATIVE NEGATIVE Final   C Diff interpretation No C. difficile detected.  Final    Comment: Performed at [facility], [address] [place], [place] [address]  Gastrointestinal Panel by PCR , Stool     Status: None   Collection Time: 08/07/22  4:46 AM   Specimen: STOOL  Result Value Ref Range Status   Campylobacter species NOT DETECTED NOT DETECTED Final   Plesimonas shigelloides NOT DETECTED NOT DETECTED Final   Salmonella species NOT DETECTED NOT DETECTED Final   Yersinia enterocolitica NOT DETECTED NOT DETECTED Final   Vibrio species NOT DETECTED NOT  DETECTED Final   Vibrio cholerae NOT DETECTED NOT DETECTED Final   Enteroaggregative E coli (EAEC) NOT DETECTED NOT DETECTED Final   Enteropathogenic E coli (EPEC) NOT DETECTED NOT DETECTED Final   Enterotoxigenic E coli (ETEC) NOT DETECTED NOT DETECTED Final   Shiga like toxin producing E coli (STEC) NOT DETECTED NOT DETECTED Final  Shigella/Enteroinvasive E coli (EIEC) NOT DETECTED NOT DETECTED Final   Cryptosporidium NOT DETECTED NOT DETECTED Final   Cyclospora cayetanensis NOT DETECTED NOT DETECTED Final   Entamoeba histolytica NOT DETECTED NOT DETECTED Final   Giardia lamblia NOT DETECTED NOT DETECTED Final   Adenovirus F40/41 NOT DETECTED NOT DETECTED Final   Astrovirus NOT DETECTED NOT DETECTED Final   Norovirus GI/GII NOT DETECTED NOT DETECTED Final   Rotavirus A NOT DETECTED NOT DETECTED Final   Sapovirus (I, II, IV, and V) NOT DETECTED NOT DETECTED Final    Comment: Performed at [facility], [address] [place], [place] [address]     Medications:    acidophilus  2 capsule Oral Daily   acyclovir  400 mg Oral QHS   budesonide (PULMICORT) nebulizer solution  0.25 mg Nebulization BID   calcium carbonate  800 mg of elemental calcium Oral TID   Chlorhexidine Gluconate Cloth  6 each Topical Daily   cholestyramine light  4 g Oral BID   enoxaparin (LOVENOX) injection  100 mg Subcutaneous Q12H   feeding supplement  1 Container Oral TID BM   leptospermum manuka honey  1 Application Topical Daily   loperamide  2 mg Oral BID   multivitamin with minerals  1 tablet Oral Daily   mouth rinse  15 mL Mouth Rinse 4 times per day   pantoprazole  40 mg Oral BID   phosphorus  250 mg Oral Daily   potassium chloride  40 mEq Oral Daily   sodium bicarbonate  1,300 mg Oral TID   sodium chloride flush  10-40 mL Intracatheter Q12H   Continuous Infusions:  calcium gluconate        LOS: 16 days   Marinda Elk  [facility]  08/14/2022, 8:11 AM

## 2022-08-14 NOTE — TOC Progression Note (Signed)
Transition of Care [facility] [place]) - Progression Note    Patient Details  Name: EMITT BURGET MRN: [id number] Date of Birth: February 19, 1941  [facility]) CM/SW Contact  Otelia Santee, LCSW Phone Number: 08/14/2022, 8:49 AM  Clinical Narrative:    Pt's insurance approved for SNF. Plan ID: [id number]. Approved starting 6/17.    Expected Discharge Plan: Home/Self Care Barriers to Discharge: Continued Medical Work up  Expected Discharge Plan and Services   Discharge Planning Services: CM Consult   Living arrangements for the past 2 months: Single Family Home                                       Social Determinants of Health (SDOH) Interventions SDOH Screenings   Food Insecurity: No Food Insecurity (07/30/2022)  Recent Concern: Food Insecurity - Food Insecurity Present (05/28/2022)  Housing: Low Risk  (07/30/2022)  Transportation Needs: No Transportation Needs (07/30/2022)  Recent Concern: Transportation Needs - Unmet Transportation Needs (05/28/2022)  Utilities: Not At Risk (07/30/2022)  Depression (PHQ2-9): Low Risk  (04/29/2022)  Tobacco Use: Low Risk  (08/12/2022)    Readmission Risk Interventions    08/03/2022    8:39 AM 04/14/2022    1:48 PM 04/02/2022   11:43 AM  Readmission Risk Prevention Plan  Post Dischage Appt   Complete  Medication Screening   Complete  Transportation Screening Complete Complete Complete  PCP or Specialist Appt within 5-7 Days  Complete   Home Care Screening  Complete   Medication Review (RN CM)  Complete   Medication Review (RN Care Manager) Complete    HRI or Home Care Consult Complete    SW Recovery Care/Counseling Consult Complete    Palliative Care Screening Not Applicable    Skilled Nursing Facility Complete

## 2022-08-14 NOTE — Plan of Care (Signed)

## 2022-08-15 DIAGNOSIS — I2693 Single subsegmental pulmonary embolism without acute cor pulmonale: Secondary | ICD-10-CM | POA: Diagnosis not present

## 2022-08-15 DIAGNOSIS — K56609 Unspecified intestinal obstruction, unspecified as to partial versus complete obstruction: Secondary | ICD-10-CM | POA: Diagnosis not present

## 2022-08-15 DIAGNOSIS — R0600 Dyspnea, unspecified: Secondary | ICD-10-CM | POA: Diagnosis not present

## 2022-08-15 LAB — BASIC METABOLIC PANEL
Anion gap: 5 (ref 5–15)
BUN: <5 mg/dL — ABNORMAL LOW (ref 8–23)
CO2: 20 mmol/L — ABNORMAL LOW (ref 22–32)
Calcium: 6.8 mg/dL — ABNORMAL LOW (ref 8.9–10.3)
Chloride: 113 mmol/L — ABNORMAL HIGH (ref 98–111)
Creatinine, Ser: 0.95 mg/dL (ref 0.61–1.24)
GFR, Estimated: >60 mL/min (ref >60)
Glucose, Bld: 66 mg/dL — ABNORMAL LOW (ref 70–99)
Potassium: 4.2 mmol/L (ref 3.5–5.1)
Sodium: 138 mmol/L (ref 135–145)

## 2022-08-15 LAB — GLUCOSE, CAPILLARY
Glucose-Capillary: 94 mg/dL (ref 70–99)
Glucose-Capillary: 70 mg/dL (ref 70–99)
Glucose-Capillary: 114 mg/dL — ABNORMAL HIGH (ref 70–99)
Glucose-Capillary: 94 mg/dL (ref 70–99)
Glucose-Capillary: 108 mg/dL — ABNORMAL HIGH (ref 70–99)

## 2022-08-15 LAB — MAGNESIUM: Magnesium: 1.4 mg/dL — ABNORMAL LOW (ref 1.7–2.4)

## 2022-08-15 LAB — PHOSPHORUS: Phosphorus: 2.3 mg/dL — ABNORMAL LOW (ref 2.5–4.6)

## 2022-08-15 MED ORDER — MAGNESIUM OXIDE -MG SUPPLEMENT 400 (240 MG) MG PO TABS
400.0000 mg | ORAL_TABLET | Freq: Two times a day (BID) | ORAL | Status: AC
  Administered 2022-08-15 (×2): 400 mg via ORAL
  Filled 2022-08-15 (×2): qty 1

## 2022-08-15 MED ORDER — MAGNESIUM SULFATE 2 GM/50ML IV SOLN
2.0000 g | Freq: Once | INTRAVENOUS | Status: AC
  Administered 2022-08-15: 2 g via INTRAVENOUS
  Filled 2022-08-15: qty 50

## 2022-08-15 MED ORDER — K PHOS MONO-SOD PHOS DI & MONO 155-852-130 MG PO TABS
500.0000 mg | ORAL_TABLET | Freq: Two times a day (BID) | ORAL | Status: AC
  Administered 2022-08-15 (×2): 500 mg via ORAL
  Filled 2022-08-15 (×2): qty 2

## 2022-08-15 NOTE — Progress Notes (Signed)
[facility] PROGRESS NOTE    Progress Note  Frank Hernandez  ZOX:[id number] DOB: [birth year] DOA: 07/29/2022 PCP: [name] (Inactive)     Brief Narrative:   Frank Hernandez is an 82 y.o. male past medical history of essential hypertension, chronic kidney stage III AA pathologic fracture of the femoral head, multiple myeloma comes into the ED with wheezing and rhonchi CTA of the chest showed subsegmental PE started on IV Lovenox.  CT scan of the abdomen pelvis showed dilated colon with air-fluid level seen   Assessment/Plan:   Acute left lower lobe segmental pulmonary embolism/acute left lower extremity DVT. 2D echo showed no RV strain. Currently on  Lovenox. PT evaluated the patient, will need skilled nursing facility probably on 08/16/2022.  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 NG tube was placed which has now been removed. Surgery on board as well as GI. GI more concerned about Ogilvie syndrome. On a soft diet.  Diarrhea: Infectious workup was positive for enteropathic E. coli for which she has completed 3-day course. C. difficile negative. Dr. Talbert Forest that is unlikely due to daratumumab last week was 2 days prior 3 doses of albumin in 08/06/2022. 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 Flex sig on 08/09/2022 showed edematous mucosa of the sigmoid descending and transverse colon biopsies were taken. Her stools are slowed down GI has signed off. Bowels have slowed down discontinue rectal tube.  Acute respiratory failure with hypoxia possibly due to aspiration pneumonia: She is to complete a 5-day course of IV Unasyn. She has been weaned to room air.  Normal anion gap metabolic acidosis in the setting of GI losses: Nephrology has been consulted and no sign off.  Hypokalemia/hypomagnesemia/hypophosphatemia: Try to eat potassium greater than 4, magnesium still low  replete IV and orally. Phosphorus is low replete orally recheck in the morning.  Hypocalcemia: Continue calcium supplements twice a day.  Bilateral lower extremity cellulitis: She completed 5-day course of antibiotics.  Prolonged QTc: Try to replete electrolytes to keep potassium greater than 4 magnesium greater than 2.  Acute kidney injury on chronic kidney disease stage IIIa: With a baseline creatinine of around 1.3 on admission 1.8. Improved with IV fluids.  Normocytic anemia: Likely due to myeloma currently asymptomatic. No signs of overt bleeding.  Knee pain: Chest x-ray shows severe osteoarthritis follow-up with PCP as an outpatient.  Multiple myeloma: Follow-up with oncology as an outpatient.  Stage I significant results are present on admission: RN Pressure Injury Documentation: Pressure Injury 03/29/22 Buttocks Right Stage 1 -  Intact skin with non-blanchable redness of a localized area usually over a bony prominence. (Active)  03/29/22 2140  Location: Buttocks  Location Orientation: Right  Staging: Stage 1 -  Intact skin with non-blanchable redness of a localized area usually over a bony prominence.  Wound Description (Comments):   Present on Admission: Yes     Pressure Injury 05/28/22 Heel Right Stage 3 -  Full thickness tissue loss. Subcutaneous fat may be visible but bone, tendon or muscle are NOT exposed. Open wound, broken from pressure & dry skin (Active)  05/28/22 1400  Location: Heel  Location Orientation: Right  Staging: Stage 3 -  Full thickness tissue loss. Subcutaneous fat may be visible but bone, tendon or muscle are NOT exposed.  Wound Description (Comments): Open wound, broken from pressure & dry skin  Present on Admission: Yes  Dressing Type Foam - Lift dressing  to assess site every shift 08/14/22 2100     Pressure Injury 08/08/22 Buttocks Right;Left Deep Tissue Pressure Injury - Purple or maroon localized area of discolored intact skin or  blood-filled blister due to damage of underlying soft tissue from pressure and/or shear. (Active)  08/08/22 1241  Location: Buttocks  Location Orientation: Right;Left  Staging: Deep Tissue Pressure Injury - Purple or maroon localized area of discolored intact skin or blood-filled blister due to damage of underlying soft tissue from pressure and/or shear.  Wound Description (Comments):   Present on Admission:   Dressing Type Foam - Lift dressing to assess site every shift 08/14/22 2100    DVT prophylaxis: lovenox Family Communication:none Status is: Inpatient Remains inpatient appropriate because: Acute kidney injury, Ogilvie syndrome, new PE    Code Status:     Code Status Orders  (From admission, onward)           Start     Ordered   07/29/22 1204  Full code  Continuous       Question:  By:  Answer:  Consent: discussion documented in EHR   07/29/22 1204           Code Status History     Date Active Date Inactive Code Status Order ID Comments User Context   05/28/2022 1110 06/01/2022 2315 Full Code [id number]  Maryln Gottron, MD ED   04/13/2022 1309 04/23/2022 0203 Full Code [id number]  Elease Etienne, MD ED   03/30/2022 0130 04/05/2022 2058 Full Code [id number]  Gery Pray, MD ED   06/10/2020 2243 06/25/2020 0330 Full Code [id number]  Kinsinger, De Blanch, MD ED   12/26/2015 1513 12/27/2015 2120 Full Code [id number]  Black, Lesle Chris, NP ED         IV Access:   Peripheral IV   Procedures and diagnostic studies:   No results found.   Medical Consultants:   None.   Subjective:    Frank Hernandez no complaints  Objective:    Vitals:   08/14/22 1313 08/14/22 1933 08/15/22 0445 08/15/22 0838  BP: 120/70 (!) 142/77 133/77   Pulse: 74 68 77   Resp: 16 16 17    Temp: 98.8 F (37.1 C) 98.4 F (36.9 C) 98.3 F (36.8 C)   TempSrc: Oral Oral Oral   SpO2: 100% 100% 100% 98%  Weight:      Height:       SpO2: 98 % O2 Flow Rate (L/min): 2  L/min FiO2 (%): 21 %   Intake/Output Summary (Last 24 hours) at 08/15/2022 0846 Last data filed at 08/14/2022 2100 Gross per 24 hour  Intake 1313 ml  Output 400 ml  Net 913 ml    Filed Weights   07/29/22 0822 08/07/22 1455  Weight: 90.3 kg 101.2 kg    Exam: General exam: In no acute distress. Respiratory system: Good air movement and clear to auscultation. Cardiovascular system: S1 & S2 heard, RRR. No JVD. Gastrointestinal system: Abdomen is nondistended, soft and nontender.  Extremities: No pedal edema. Skin: No rashes, lesions or ulcers Psychiatry: Judgement and insight appear normal. Mood & affect appropriate.    Data Reviewed:    Labs: Basic Metabolic Panel: Recent Labs  Lab 08/11/22 0541 08/12/22 0221 08/13/22 0332 08/14/22 0309 08/15/22 0309  NA 139 142 141 139 138  K 3.1* 4.0 4.6 4.2 4.2  CL 111 115* 115* 112* 113*  CO2 20* 20* 20* 19* 20*  GLUCOSE 75 77 112* 62* 66*  BUN <5* <5* <  5* <5* <5*  CREATININE 0.94 1.05 1.03 1.08 0.95  CALCIUM 7.2* 7.3* 7.2* 7.1* 6.8*  MG 1.7 1.7 1.4* 1.6* 1.4*  PHOS 2.3* 2.3* 1.6* 1.7* 2.3*    GFR Estimated Creatinine Clearance: 71.5 mL/min (by C-G formula based on SCr of 0.95 mg/dL). Liver Function Tests: Recent Labs  Lab 08/10/22 0531  ALBUMIN 2.5*    No results for input(s): "LIPASE", "AMYLASE" in the last 168 hours. No results for input(s): "AMMONIA" in the last 168 hours. Coagulation profile No results for input(s): "INR", "PROTIME" in the last 168 hours. COVID-19 Labs  No results for input(s): "DDIMER", "FERRITIN", "LDH", "CRP" in the last 72 hours.  Lab Results  Component Value Date   SARSCOV2NAA NEGATIVE 07/29/2022   SARSCOV2NAA NEGATIVE 03/23/2022   SARSCOV2NAA NEGATIVE 06/21/2020   SARSCOV2NAA POSITIVE (A) 06/20/2020    CBC: Recent Labs  Lab 08/09/22 0527 08/10/22 0531 08/12/22 0221 08/13/22 0332 08/14/22 0925  WBC 4.1 9.3 4.9 4.4 5.0  HGB 7.5* 7.6* 7.0* 7.2* 7.2*  HCT 22.4* 22.6* 21.3*  22.2* 21.9*  MCV 93.7 94.6 95.1 94.9 95.6  PLT 162 180 199 230 249    Cardiac Enzymes: No results for input(s): "CKTOTAL", "CKMB", "CKMBINDEX", "TROPONINI" in the last 168 hours. BNP (last 3 results) No results for input(s): "PROBNP" in the last 8760 hours. CBG: Recent Labs  Lab 08/14/22 1142 08/14/22 1808 08/14/22 2353 08/15/22 0554 08/15/22 0728  GLUCAP 140* 92 77 94 70    D-Dimer: No results for input(s): "DDIMER" in the last 72 hours. Hgb A1c: No results for input(s): "HGBA1C" in the last 72 hours. Lipid Profile: No results for input(s): "CHOL", "HDL", "LDLCALC", "TRIG", "CHOLHDL", "LDLDIRECT" in the last 72 hours. Thyroid function studies: No results for input(s): "TSH", "T4TOTAL", "T3FREE", "THYROIDAB" in the last 72 hours.  Invalid input(s): "FREET3" Anemia work up: No results for input(s): "VITAMINB12", "FOLATE", "FERRITIN", "TIBC", "IRON", "RETICCTPCT" in the last 72 hours. Sepsis Labs: Recent Labs  Lab 08/09/22 1812 08/10/22 0531 08/12/22 0221 08/13/22 0332 08/14/22 0925  PROCALCITON 0.22  --   --   --   --   WBC  --  9.3 4.9 4.4 5.0    Microbiology Recent Results (from the past 240 hour(s))  C Difficile Quick Screen (NO PCR Reflex)     Status: None   Collection Time: 08/07/22  4:46 AM   Specimen: STOOL  Result Value Ref Range Status   C Diff antigen NEGATIVE NEGATIVE Final   C Diff toxin NEGATIVE NEGATIVE Final   C Diff interpretation No C. difficile detected.  Final    Comment: Performed at [facility], [address], [place], [place] [address]  Gastrointestinal Panel by PCR , Stool     Status: None   Collection Time: 08/07/22  4:46 AM   Specimen: STOOL  Result Value Ref Range Status   Campylobacter species NOT DETECTED NOT DETECTED Final   Plesimonas shigelloides NOT DETECTED NOT DETECTED Final   Salmonella species NOT DETECTED NOT DETECTED Final   Yersinia enterocolitica NOT DETECTED NOT DETECTED Final   Vibrio species  NOT DETECTED NOT DETECTED Final   Vibrio cholerae NOT DETECTED NOT DETECTED Final   Enteroaggregative E coli (EAEC) NOT DETECTED NOT DETECTED Final   Enteropathogenic E coli (EPEC) NOT DETECTED NOT DETECTED Final   Enterotoxigenic E coli (ETEC) NOT DETECTED NOT DETECTED Final   Shiga like toxin producing E coli (STEC) NOT DETECTED NOT DETECTED Final   Shigella/Enteroinvasive E coli (EIEC) NOT DETECTED NOT  DETECTED Final   Cryptosporidium NOT DETECTED NOT DETECTED Final   Cyclospora cayetanensis NOT DETECTED NOT DETECTED Final   Entamoeba histolytica NOT DETECTED NOT DETECTED Final   Giardia lamblia NOT DETECTED NOT DETECTED Final   Adenovirus F40/41 NOT DETECTED NOT DETECTED Final   Astrovirus NOT DETECTED NOT DETECTED Final   Norovirus GI/GII NOT DETECTED NOT DETECTED Final   Rotavirus A NOT DETECTED NOT DETECTED Final   Sapovirus (I, II, IV, and V) NOT DETECTED NOT DETECTED Final    Comment: Performed at [facility], [address] [place], [place] [address]     Medications:    acidophilus  2 capsule Oral Daily   acyclovir  400 mg Oral QHS   budesonide (PULMICORT) nebulizer solution  0.25 mg Nebulization BID   calcium carbonate  800 mg of elemental calcium Oral TID   Chlorhexidine Gluconate Cloth  6 each Topical Daily   cholestyramine light  4 g Oral BID   enoxaparin (LOVENOX) injection  100 mg Subcutaneous Q12H   feeding supplement  1 Container Oral TID BM   leptospermum manuka honey  1 Application Topical Daily   loperamide  2 mg Oral BID   multivitamin with minerals  1 tablet Oral Daily   mouth rinse  15 mL Mouth Rinse 4 times per day   pantoprazole  40 mg Oral BID   potassium chloride  40 mEq Oral Daily   sodium bicarbonate  1,300 mg Oral TID   sodium chloride flush  10-40 mL Intracatheter Q12H   Continuous Infusions:  calcium gluconate        LOS: 17 days   Marinda Elk  [facility]  08/15/2022, 8:46 AM

## 2022-08-15 NOTE — Plan of Care (Signed)

## 2022-08-15 NOTE — Plan of Care (Signed)

## 2022-08-15 NOTE — Progress Notes (Signed)
Flexiseal deflated and remover per order D Susann Givens RN

## 2022-08-16 DIAGNOSIS — K219 Gastro-esophageal reflux disease without esophagitis: Secondary | ICD-10-CM | POA: Diagnosis not present

## 2022-08-16 DIAGNOSIS — M47816 Spondylosis without myelopathy or radiculopathy, lumbar region: Secondary | ICD-10-CM | POA: Diagnosis not present

## 2022-08-16 DIAGNOSIS — K5730 Diverticulosis of large intestine without perforation or abscess without bleeding: Secondary | ICD-10-CM

## 2022-08-16 DIAGNOSIS — R7303 Prediabetes: Secondary | ICD-10-CM

## 2022-08-16 DIAGNOSIS — L89610 Pressure ulcer of right heel, unstageable: Secondary | ICD-10-CM | POA: Diagnosis not present

## 2022-08-16 DIAGNOSIS — K56609 Unspecified intestinal obstruction, unspecified as to partial versus complete obstruction: Secondary | ICD-10-CM | POA: Diagnosis not present

## 2022-08-16 DIAGNOSIS — M62562 Muscle wasting and atrophy, not elsewhere classified, left lower leg: Secondary | ICD-10-CM | POA: Diagnosis present

## 2022-08-16 DIAGNOSIS — Z8551 Personal history of malignant neoplasm of bladder: Secondary | ICD-10-CM | POA: Diagnosis not present

## 2022-08-16 DIAGNOSIS — M25461 Effusion, right knee: Secondary | ICD-10-CM | POA: Diagnosis not present

## 2022-08-16 DIAGNOSIS — N179 Acute kidney failure, unspecified: Secondary | ICD-10-CM | POA: Diagnosis present

## 2022-08-16 DIAGNOSIS — L602 Onychogryphosis: Secondary | ICD-10-CM | POA: Diagnosis not present

## 2022-08-16 DIAGNOSIS — Z993 Dependence on wheelchair: Secondary | ICD-10-CM | POA: Diagnosis not present

## 2022-08-16 DIAGNOSIS — Z79899 Other long term (current) drug therapy: Secondary | ICD-10-CM | POA: Diagnosis not present

## 2022-08-16 DIAGNOSIS — M25561 Pain in right knee: Secondary | ICD-10-CM | POA: Diagnosis not present

## 2022-08-16 DIAGNOSIS — M25551 Pain in right hip: Secondary | ICD-10-CM | POA: Diagnosis not present

## 2022-08-16 DIAGNOSIS — C7951 Secondary malignant neoplasm of bone: Secondary | ICD-10-CM | POA: Diagnosis not present

## 2022-08-16 DIAGNOSIS — L819 Disorder of pigmentation, unspecified: Secondary | ICD-10-CM | POA: Diagnosis present

## 2022-08-16 DIAGNOSIS — L97413 Non-pressure chronic ulcer of right heel and midfoot with necrosis of muscle: Secondary | ICD-10-CM | POA: Diagnosis not present

## 2022-08-16 DIAGNOSIS — N1831 Chronic kidney disease, stage 3a: Secondary | ICD-10-CM | POA: Diagnosis present

## 2022-08-16 DIAGNOSIS — E43 Unspecified severe protein-calorie malnutrition: Secondary | ICD-10-CM | POA: Diagnosis present

## 2022-08-16 DIAGNOSIS — Z7401 Bed confinement status: Secondary | ICD-10-CM | POA: Diagnosis not present

## 2022-08-16 DIAGNOSIS — D649 Anemia, unspecified: Secondary | ICD-10-CM | POA: Diagnosis not present

## 2022-08-16 DIAGNOSIS — I2699 Other pulmonary embolism without acute cor pulmonale: Secondary | ICD-10-CM | POA: Diagnosis not present

## 2022-08-16 DIAGNOSIS — R197 Diarrhea, unspecified: Secondary | ICD-10-CM | POA: Diagnosis present

## 2022-08-16 DIAGNOSIS — X58XXXA Exposure to other specified factors, initial encounter: Secondary | ICD-10-CM | POA: Diagnosis present

## 2022-08-16 DIAGNOSIS — E860 Dehydration: Secondary | ICD-10-CM | POA: Diagnosis present

## 2022-08-16 DIAGNOSIS — M1711 Unilateral primary osteoarthritis, right knee: Secondary | ICD-10-CM | POA: Diagnosis not present

## 2022-08-16 DIAGNOSIS — L03115 Cellulitis of right lower limb: Secondary | ICD-10-CM | POA: Diagnosis not present

## 2022-08-16 DIAGNOSIS — D509 Iron deficiency anemia, unspecified: Secondary | ICD-10-CM | POA: Diagnosis not present

## 2022-08-16 DIAGNOSIS — R9431 Abnormal electrocardiogram [ECG] [EKG]: Secondary | ICD-10-CM | POA: Diagnosis not present

## 2022-08-16 DIAGNOSIS — M6281 Muscle weakness (generalized): Secondary | ICD-10-CM | POA: Diagnosis not present

## 2022-08-16 DIAGNOSIS — L89620 Pressure ulcer of left heel, unstageable: Secondary | ICD-10-CM | POA: Diagnosis present

## 2022-08-16 DIAGNOSIS — C9000 Multiple myeloma not having achieved remission: Secondary | ICD-10-CM | POA: Diagnosis present

## 2022-08-16 DIAGNOSIS — I129 Hypertensive chronic kidney disease with stage 1 through stage 4 chronic kidney disease, or unspecified chronic kidney disease: Secondary | ICD-10-CM | POA: Diagnosis present

## 2022-08-16 DIAGNOSIS — G238 Other specified degenerative diseases of basal ganglia: Secondary | ICD-10-CM | POA: Diagnosis not present

## 2022-08-16 DIAGNOSIS — R410 Disorientation, unspecified: Secondary | ICD-10-CM | POA: Diagnosis not present

## 2022-08-16 DIAGNOSIS — E1159 Type 2 diabetes mellitus with other circulatory complications: Secondary | ICD-10-CM | POA: Diagnosis not present

## 2022-08-16 DIAGNOSIS — S7221XD Displaced subtrochanteric fracture of right femur, subsequent encounter for closed fracture with routine healing: Secondary | ICD-10-CM | POA: Diagnosis not present

## 2022-08-16 DIAGNOSIS — I2609 Other pulmonary embolism with acute cor pulmonale: Secondary | ICD-10-CM | POA: Diagnosis not present

## 2022-08-16 DIAGNOSIS — R5383 Other fatigue: Secondary | ICD-10-CM | POA: Diagnosis not present

## 2022-08-16 DIAGNOSIS — M62561 Muscle wasting and atrophy, not elsewhere classified, right lower leg: Secondary | ICD-10-CM | POA: Diagnosis present

## 2022-08-16 DIAGNOSIS — R29898 Other symptoms and signs involving the musculoskeletal system: Secondary | ICD-10-CM | POA: Diagnosis not present

## 2022-08-16 DIAGNOSIS — R000 Tachycardia, unspecified: Secondary | ICD-10-CM | POA: Diagnosis not present

## 2022-08-16 DIAGNOSIS — E46 Unspecified protein-calorie malnutrition: Secondary | ICD-10-CM | POA: Diagnosis not present

## 2022-08-16 DIAGNOSIS — Z7901 Long term (current) use of anticoagulants: Secondary | ICD-10-CM | POA: Diagnosis not present

## 2022-08-16 DIAGNOSIS — E785 Hyperlipidemia, unspecified: Secondary | ICD-10-CM | POA: Diagnosis not present

## 2022-08-16 DIAGNOSIS — Z0389 Encounter for observation for other suspected diseases and conditions ruled out: Secondary | ICD-10-CM | POA: Diagnosis not present

## 2022-08-16 DIAGNOSIS — E876 Hypokalemia: Secondary | ICD-10-CM | POA: Diagnosis not present

## 2022-08-16 DIAGNOSIS — L853 Xerosis cutis: Secondary | ICD-10-CM | POA: Diagnosis not present

## 2022-08-16 DIAGNOSIS — E861 Hypovolemia: Secondary | ICD-10-CM | POA: Diagnosis present

## 2022-08-16 DIAGNOSIS — F03A0 Unspecified dementia, mild, without behavioral disturbance, psychotic disturbance, mood disturbance, and anxiety: Secondary | ICD-10-CM | POA: Diagnosis present

## 2022-08-16 DIAGNOSIS — E875 Hyperkalemia: Secondary | ICD-10-CM | POA: Diagnosis present

## 2022-08-16 DIAGNOSIS — I10 Essential (primary) hypertension: Secondary | ICD-10-CM | POA: Diagnosis not present

## 2022-08-16 DIAGNOSIS — K529 Noninfective gastroenteritis and colitis, unspecified: Secondary | ICD-10-CM | POA: Diagnosis not present

## 2022-08-16 DIAGNOSIS — L89613 Pressure ulcer of right heel, stage 3: Secondary | ICD-10-CM | POA: Diagnosis not present

## 2022-08-16 DIAGNOSIS — R627 Adult failure to thrive: Secondary | ICD-10-CM | POA: Diagnosis present

## 2022-08-16 DIAGNOSIS — E872 Acidosis, unspecified: Secondary | ICD-10-CM | POA: Diagnosis present

## 2022-08-16 DIAGNOSIS — Z6824 Body mass index (BMI) 24.0-24.9, adult: Secondary | ICD-10-CM | POA: Diagnosis not present

## 2022-08-16 DIAGNOSIS — K5981 Ogilvie syndrome: Secondary | ICD-10-CM | POA: Diagnosis not present

## 2022-08-16 DIAGNOSIS — G9341 Metabolic encephalopathy: Secondary | ICD-10-CM | POA: Diagnosis present

## 2022-08-16 DIAGNOSIS — R4182 Altered mental status, unspecified: Secondary | ICD-10-CM | POA: Diagnosis not present

## 2022-08-16 DIAGNOSIS — G9340 Encephalopathy, unspecified: Secondary | ICD-10-CM | POA: Diagnosis not present

## 2022-08-16 DIAGNOSIS — R634 Abnormal weight loss: Secondary | ICD-10-CM | POA: Diagnosis not present

## 2022-08-16 DIAGNOSIS — R4701 Aphasia: Secondary | ICD-10-CM | POA: Diagnosis not present

## 2022-08-16 DIAGNOSIS — Z91199 Patient's noncompliance with other medical treatment and regimen due to unspecified reason: Secondary | ICD-10-CM | POA: Diagnosis not present

## 2022-08-16 DIAGNOSIS — I82432 Acute embolism and thrombosis of left popliteal vein: Secondary | ICD-10-CM | POA: Diagnosis not present

## 2022-08-16 DIAGNOSIS — E559 Vitamin D deficiency, unspecified: Secondary | ICD-10-CM | POA: Diagnosis not present

## 2022-08-16 DIAGNOSIS — R930 Abnormal findings on diagnostic imaging of skull and head, not elsewhere classified: Secondary | ICD-10-CM | POA: Diagnosis not present

## 2022-08-16 DIAGNOSIS — R531 Weakness: Secondary | ICD-10-CM | POA: Diagnosis not present

## 2022-08-16 DIAGNOSIS — R630 Anorexia: Secondary | ICD-10-CM | POA: Diagnosis not present

## 2022-08-16 LAB — GLUCOSE, CAPILLARY: Glucose-Capillary: 94 mg/dL (ref 70–99)

## 2022-08-16 LAB — MAGNESIUM: Magnesium: 1.5 mg/dL — ABNORMAL LOW (ref 1.7–2.4)

## 2022-08-16 LAB — PHOSPHORUS: Phosphorus: 2.5 mg/dL (ref 2.5–4.6)

## 2022-08-16 MED ORDER — APIXABAN 5 MG PO TABS: 5.0000 mg | ORAL_TABLET | Freq: Two times a day (BID) | ORAL | Status: DC

## 2022-08-16 MED ORDER — APIXABAN 5 MG PO TABS: 5.0000 mg | ORAL_TABLET | Freq: Two times a day (BID) | ORAL | 0 refills | Status: DC

## 2022-08-16 MED ORDER — PANTOPRAZOLE SODIUM 40 MG PO TBEC: 40.0000 mg | DELAYED_RELEASE_TABLET | Freq: Two times a day (BID) | ORAL | 0 refills | Status: DC

## 2022-08-16 MED ORDER — ADULT MULTIVITAMIN W/MINERALS CH: 1.0000 | ORAL_TABLET | Freq: Every day | ORAL | 0 refills | Status: DC

## 2022-08-16 NOTE — Discharge Summary (Signed)
Physician Discharge Summary  Frank Hernandez ZOX:[id number] DOB: 1940-03-10 DOA: 07/29/2022  PCP: Frank Hernandez (Inactive)  Admit date: 07/29/2022 Discharge date: 08/16/2022  Admitted From: [facility] Disposition:  SNF  Recommendations for Outpatient Follow-up:  Follow up with PCP in 1-2 weeks Follow up with GI as scheduled  Discharge Condition:Stable  CODE STATUS:Full  Diet recommendation: As tolerated    Brief/Interim Summary: Frank Hernandez is an 82 y.o. male past medical history of essential hypertension, chronic kidney stage III AA pathologic fracture of the femoral head, multiple myeloma comes into the ED with wheezing and rhonchi CTA of the chest showed subsegmental PE started on IV Lovenox.  CT scan of the abdomen pelvis showed dilated colon with air-fluid level seen   Noted PE/DVT on eliquis, constipation (possibly Ogilvie syndrome) resolved with diet changes, diarrhea resolved, acute hypoxia/aspiration PNA resolved - now on room air and has completed antibiotics, multiple electrolyte abnormalities improving. Otherwise stable for DC to SNF given recommendations per PT/OT.  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 2D echo showed no RV strain. PT evaluated the patient, will need skilled nursing facility probably on 08/16/2022. Eliquis 5mg  BID   Possibly Ogilvie syndrome: CT of the abdomen pelvis showed multiple dilated colon air-fluid levels. NG tube was placed which has now been  removed. Surgery on board as well as GI. GI more concerned about Ogilvie syndrome. On a soft diet.   Diarrhea, noninfectious: Infectious workup was positive for enteropathic E. coli for which she has completed 3-day course. C. difficile negative. Dr. Talbert Forest that is unlikely due to daratumumab last week was 2 days prior 3 doses of albumin in 08/06/2022. 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 Her stools are slowed down GI has signed off DC rectal tube   Acute respiratory failure with hypoxia possibly due to aspiration pneumonia, resolved She is to complete a 5-day course of IV Unasyn and currently on room air   Normal anion gap metabolic acidosis in the setting of GI losses: Nephrology has been consulted and no sign off.   Hypokalemia/hypomagnesemia/hypophosphatemia: Try to eat potassium greater than 4, magnesium still low replete IV and orally. Phosphorus is low replete orally recheck in the morning.   Hypocalcemia: Continue calcium supplements twice a day.   Bilateral lower extremity cellulitis: She completed 5-day course of antibiotics.   Prolonged QTc: Try to replete electrolytes to keep potassium greater than 4 magnesium greater than 2.   Acute kidney injury on chronic kidney disease stage IIIa: With a baseline creatinine of around 1.3 on admission 1.8. Resolved   Normocytic anemia: Likely due to myeloma currently asymptomatic. No signs of overt bleeding.   Knee pain: Chest x-ray shows severe osteoarthritis follow-up with PCP as an outpatient.   Multiple myeloma: Follow-up with oncology as an outpatient.   Stage I pressure ulcer significant results are present on admission R buttocks; R heel, L  buttocks   Discharge Instructions   Allergies as of 08/16/2022   No Known Allergies      Medication List      STOP taking these medications    aspirin EC 81 MG tablet   doxazosin 2 MG tablet Commonly known as: CARDURA   mirtazapine 15 MG tablet Commonly known as: Remeron   ondansetron 8 MG tablet Commonly known as: Zofran   prochlorperazine 10 MG tablet Commonly known as: COMPAZINE       TAKE these medications    acetaminophen 500 MG tablet Commonly known as: TYLENOL Take 1,000 mg by mouth daily as needed for moderate pain.   acyclovir 400 MG tablet Commonly known as: ZOVIRAX Take 1 tablet (400 mg total) by mouth daily.   apixaban 5 MG Tabs tablet Commonly known as: ELIQUIS Take 1 tablet (5 mg total) by mouth 2 (two) times daily.   feeding supplement Liqd Take 237 mLs by mouth 2 (two) times daily between meals. What changed:  when to take this reasons to take this   ferrous sulfate 325 (65 FE) MG tablet Take 1 tablet (325 mg total) by mouth 2 (two) times daily with a meal. What changed: when to take this   loperamide 2 MG capsule Commonly known as: IMODIUM Take 1 capsule (2 mg total) by mouth as needed for diarrhea or loose stools. What changed: when to take this   multivitamin with minerals Tabs tablet Take 1 tablet by mouth daily.   pantoprazole 40 MG tablet Commonly known as: PROTONIX Take 1 tablet (40 mg total) by mouth 2 (two) times daily.   potassium chloride SA 20 MEQ tablet Commonly known as: KLOR-CON M Take 2 tablets (40 mEq total) by mouth 2 (two) times daily. What changed:  how much to take when to take this   Vitamin D (Ergocalciferol) 1.25 MG (50000 UNIT) Caps capsule Commonly known as: DRISDOL Take 1 capsule (50,000 Units total) by mouth once a week.        Contact information for after-discharge care     Destination     [facility] Preferred SNF .   Service: Skilled Nursing Contact information: [address] [place] [address] [phone]                    No Known  Allergies  Consultations: GI, Nephrology   Procedures/Studies: VAS [place] UPPER EXTREMITY VENOUS DUPLEX  Result Date: 08/10/2022 UPPER VENOUS STUDY  Patient Name:  Frank Hernandez  Date of Exam:   08/10/2022 Medical Rec #: [id number]            Accession #:    [id number] Date of Birth: Dec 20, 1940             Patient Gender: M Patient Age:   63 years Exam Location:  [facility] Procedure:      VAS [place] UPPER EXTREMITY VENOUS DUPLEX Referring Phys: RIPUDEEP RAI --------------------------------------------------------------------------------  Indications: Edema Risk Factors: None identified. Limitations: Poor ultrasound/tissue interface, line and bandages. Comparison Study: No prior studies. Performing Technologist: Chanda Busing RVT  Examination Guidelines: A complete evaluation includes B-mode imaging, spectral Doppler, color Doppler, and power Doppler as needed of all accessible portions of each vessel. Bilateral testing is considered an integral part of a complete examination. Limited examinations for reoccurring indications may be performed as noted.  Right Findings: +----------+------------+---------+-----------+----------+-------+ RIGHT     CompressiblePhasicitySpontaneousPropertiesSummary +----------+------------+---------+-----------+----------+-------+ IJV           Full  Yes       Yes                      +----------+------------+---------+-----------+----------+-------+ Subclavian    Full       Yes       Yes                      +----------+------------+---------+-----------+----------+-------+ Axillary      Full       Yes       Yes                      +----------+------------+---------+-----------+----------+-------+ Brachial      Full                                          +----------+------------+---------+-----------+----------+-------+ Radial        Full                                           +----------+------------+---------+-----------+----------+-------+ Ulnar         Full                                          +----------+------------+---------+-----------+----------+-------+ Cephalic      Full                                          +----------+------------+---------+-----------+----------+-------+ Basilic       Full                                          +----------+------------+---------+-----------+----------+-------+  Left Findings: +----------+------------+---------+-----------+----------+-------+ LEFT      CompressiblePhasicitySpontaneousPropertiesSummary +----------+------------+---------+-----------+----------+-------+ Subclavian    Full       Yes       Yes                      +----------+------------+---------+-----------+----------+-------+  Summary:  Right: No evidence of deep vein thrombosis in the upper extremity. No evidence of superficial vein thrombosis in the upper extremity.  Left: No evidence of thrombosis in the subclavian.  *See table(s) above for measurements and observations.  Diagnosing physician: Coral Else MD Electronically signed by Coral Else MD on 08/10/2022 at 5:49:11 PM.    Final    DG CHEST PORT 1 VIEW  Result Date: 08/09/2022 CLINICAL DATA:  Wheezing noted after sigmoidoscopy procedure today. Dyspnea. EXAM: PORTABLE CHEST 1 VIEW COMPARISON:  08/08/2022 FINDINGS: Shallow inspiration. Heart size and pulmonary vascularity are normal for technique. Right PICC line with tip over the low SVC region. No pneumothorax. Interval development of a focal area of consolidation in the left lower lung. This may represent pneumonia, aspiration, or atelectasis. Right lung is clear. No pleural effusions. Calcified and tortuous aorta. Degenerative changes in the spine and shoulders. IMPRESSION: New left lower lung consolidation. Electronically Signed   By: Burman Nieves M.D.   On: 08/09/2022 18:15   DG CHEST PORT 1 VIEW  Result  Date: 08/08/2022 CLINICAL DATA:  PICC line placement. EXAM: PORTABLE CHEST 1 VIEW COMPARISON:  07/30/2022 FINDINGS: Lungs are clear. Cardiopericardial silhouette is at upper limits of normal for size. Right PICC line is new in the interval with tip overlying the mid SVC level. Telemetry leads overlie the chest. IMPRESSION: Right PICC line tip overlies the mid SVC level. Electronically Signed   By: Kennith Center M.D.   On: 08/08/2022 09:43   [place] EKG SITE RITE  Result Date: 08/07/2022 If Site Rite image not attached, placement could not be confirmed due to current cardiac rhythm.  DG Abd 1 View  Result Date: 08/06/2022 CLINICAL DATA:  Ileus EXAM: ABDOMEN - 1 VIEW COMPARISON:  Radiographs 08/04/2022 FINDINGS: Decreased gaseous distention of the colon compared to 08/04/2022. No definite small bowel dilation. Postoperative changes both femurs. IMPRESSION: Decreased gaseous distention of the colon compared with 08/04/2022. Electronically Signed   By: Minerva Fester M.D.   On: 08/06/2022 20:48   DG Abd 1 View  Result Date: 08/04/2022 CLINICAL DATA:  Pseudo-obstruction of colon. EXAM: ABDOMEN - 1 VIEW COMPARISON:  August 02, 2022. FINDINGS: There appears to be continued gaseous distention of the colon, although it appears to be slightly decreased compared to prior exam. No small bowel dilatation is noted. IMPRESSION: Slightly decreased gaseous distention of the colon suggesting mildly improved ileus. Electronically Signed   By: Lupita Raider M.D.   On: 08/04/2022 12:36   DG Abd 1 View  Result Date: 08/02/2022 CLINICAL DATA:  Evaluate ileus EXAM: ABDOMEN - 1 VIEW COMPARISON:  07/30/2022 FINDINGS: Persistent gaseous dilatation of the colon is noted. Cecum is dilated to 10.6 cm. No free air is seen. Postsurgical changes in the proximal femurs are seen. IMPRESSION: Gaseous dilatation of the colon most consistent with a colonic ileus. Electronically Signed   By: Alcide Clever M.D.   On: 08/02/2022 19:56   DG Chest  Port 1 View  Result Date: 07/30/2022 CLINICAL DATA:  Shortness of breath. EXAM: PORTABLE CHEST 1 VIEW COMPARISON:  Chest radiograph dated Jul 29, 2022 FINDINGS: The heart size and mediastinal contours are within normal limits. Both lungs are clear. Elevation of the left hemidiaphragm with distended bowel loops in the left upper quadrant. The visualized skeletal structures are unremarkable. IMPRESSION: 1. No acute cardiopulmonary process 2. Elevation of the left hemidiaphragm with distended bowel loops in the left upper quadrant. Electronically Signed   By: Larose Hires D.O.   On: 07/30/2022 22:40   DG KNEE 3 VIEW RIGHT  Result Date: 07/30/2022 CLINICAL DATA:  Right knee pain. EXAM: RIGHT KNEE - 3 VIEW COMPARISON:  Right femur radiographs 06/04/2022 FINDINGS: There is diffuse decreased bone mineralization. Severe patellofemoral joint space narrowing and peripheral osteophytosis. No joint effusion. Moderate posterior proximal tibial degenerative spurring. Moderate to severe medial joint space narrowing and peripheral osteophytosis. Minimal peripheral lateral chronic degenerative spurring without significant joint space narrowing. A calcified density measuring 6 mm again overlies the distal anterior lateral thigh soft tissues, nonspecific. No acute fracture is seen. No dislocation. Partial visualization of femoral intramedullary nail. 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 Electronically Signed   By: Neita Garnet M.D.   On: 07/30/2022 17:59   VAS [place] LOWER EXTREMITY VENOUS (DVT)  Result Date: 07/30/2022  Lower Venous DVT Study Patient Name:  DOTSON GRASER  Date of Exam:   07/30/2022 Medical Rec #: [id number]            Accession #:  [id number] Date of Birth: 07-28-1940             Patient Gender: M Patient Age:   44 years Exam Location:  [facility] Procedure:      VAS [place] LOWER EXTREMITY VENOUS (DVT) Referring Phys: Jon Billings REGALADO  --------------------------------------------------------------------------------  Indications: Edema.  Risk Factors: Confirmed PE. Anticoagulation: Heparin. Limitations: Body habitus, poor ultrasound/tissue interface and patient positioning. Comparison Study: No prior studies. Performing Technologist: Chanda Busing RVT  Examination Guidelines: A complete evaluation includes B-mode imaging, spectral Doppler, color Doppler, and power Doppler as needed of all accessible portions of each vessel. Bilateral testing is considered an integral part of a complete examination. Limited examinations for reoccurring indications may be performed as noted. The reflux portion of the exam is performed with the patient in reverse Trendelenburg.  +---------+---------------+---------+-----------+----------+-------------------+ RIGHT    CompressibilityPhasicitySpontaneityPropertiesThrombus Aging      +---------+---------------+---------+-----------+----------+-------------------+ CFV      Full           Yes      Yes                                      +---------+---------------+---------+-----------+----------+-------------------+ SFJ      Full                                                             +---------+---------------+---------+-----------+----------+-------------------+ FV Prox  Full                                                             +---------+---------------+---------+-----------+----------+-------------------+ FV Mid                  Yes      Yes                                      +---------+---------------+---------+-----------+----------+-------------------+ FV Distal               Yes      Yes                                      +---------+---------------+---------+-----------+----------+-------------------+ PFV      Full                                                              +---------+---------------+---------+-----------+----------+-------------------+ POP      Full           Yes      Yes                                      +---------+---------------+---------+-----------+----------+-------------------+  PTV      Full                                                             +---------+---------------+---------+-----------+----------+-------------------+ PERO                                                  Not well visualized +---------+---------------+---------+-----------+----------+-------------------+   +---------+---------------+---------+-----------+----------+-------------------+ LEFT     CompressibilityPhasicitySpontaneityPropertiesThrombus Aging      +---------+---------------+---------+-----------+----------+-------------------+ CFV      Partial        Yes      Yes                  Acute               +---------+---------------+---------+-----------+----------+-------------------+ SFJ      Full                                                             +---------+---------------+---------+-----------+----------+-------------------+ FV Prox  Partial        Yes      No                   Acute               +---------+---------------+---------+-----------+----------+-------------------+ FV Mid   Partial        Yes      Yes                  Age Indeterminate   +---------+---------------+---------+-----------+----------+-------------------+ FV Distal                                             Not well visualized +---------+---------------+---------+-----------+----------+-------------------+ PFV      Full           Yes      Yes                                      +---------+---------------+---------+-----------+----------+-------------------+ POP      Partial        Yes      Yes                  Age Indeterminate   +---------+---------------+---------+-----------+----------+-------------------+  PTV      Full                                                             +---------+---------------+---------+-----------+----------+-------------------+ PERO  Not well visualized +---------+---------------+---------+-----------+----------+-------------------+ Thrombus located in the common femoral vein is noted to be in the distal segment.    Summary: RIGHT: - There is no evidence of deep vein thrombosis in the lower extremity. However, portions of this examination were limited- see technologist comments above.  - No cystic structure found in the popliteal fossa.  LEFT: - Findings consistent with acute deep vein thrombosis involving the left common femoral vein, and left femoral vein. - Findings consistent with age indeterminate deep vein thrombosis involving the left popliteal vein. - No cystic structure found in the popliteal fossa.  *See table(s) above for measurements and observations. Electronically signed by Sherald Hess MD on 07/30/2022 at 4:42:31 PM.    Final    ECHOCARDIOGRAM COMPLETE  Result Date: 07/30/2022    ECHOCARDIOGRAM REPORT   Patient Name:   FABIANO THIRY Date of Exam: 07/30/2022 Medical Rec #:  [id number]           Height:       69.0 in Accession #:    [id number]          Weight:       199.0 lb Date of Birth:  1940-12-23            BSA:          2.062 m Patient Age:    82 years            BP:           156/65 mmHg Patient Gender: M                   HR:           74 bpm. Exam Location:  Inpatient Procedure: 2D Echo, Color Doppler, Cardiac Doppler and Intracardiac            Opacification Agent Indications:    Pulmonary Embolus  History:        Patient has prior history of Echocardiogram examinations, most                 recent 04/22/2022. CKD, Signs/Symptoms:Syncope; Risk                 Factors:Hypertension.  Sonographer:    Milbert Coulter Referring Phys: [id number] DAVID MANUEL ORTIZ  Sonographer Comments: Technically  difficult study due to poor echo windows, suboptimal parasternal window, suboptimal apical window and no subcostal window. Image acquisition challenging due to patient body habitus and Image acquisition challenging due to uncooperative patient. IMPRESSIONS  1. Left ventricular ejection fraction, by estimation, is 65 to 70%. The left ventricle has normal function. The left ventricle has no regional wall motion abnormalities. Left ventricular diastolic parameters are consistent with Grade I diastolic dysfunction (impaired relaxation).  2. Right ventricular systolic function is normal. The right ventricular size is normal.  3. The mitral valve is normal in structure. Trivial mitral valve regurgitation. No evidence of mitral stenosis.  4. The aortic valve is normal in structure. Aortic valve regurgitation is trivial. No aortic stenosis is present.  5. The inferior vena cava is normal in size with greater than 50% respiratory variability, suggesting right atrial pressure of 3 mmHg. FINDINGS  Left Ventricle: Left ventricular ejection fraction, by estimation, is 65 to 70%. The left ventricle has normal function. The left ventricle has no regional wall motion abnormalities. Definity contrast agent was given IV to delineate the left ventricular  endocardial borders. The left ventricular internal cavity size was normal in size. There is  no left ventricular hypertrophy. Left ventricular diastolic parameters are consistent with Grade I diastolic dysfunction (impaired relaxation). Right Ventricle: The right ventricular size is normal. No increase in right ventricular wall thickness. Right ventricular systolic function is normal. Left Atrium: Left atrial size was normal in size. Right Atrium: Right atrial size was normal in size. Pericardium: There is no evidence of pericardial effusion. Mitral Valve: The mitral valve is normal in structure. Trivial mitral valve regurgitation. No evidence of mitral valve stenosis. Tricuspid  Valve: The tricuspid valve is normal in structure. Tricuspid valve regurgitation is mild . No evidence of tricuspid stenosis. Aortic Valve: The aortic valve is normal in structure. Aortic valve regurgitation is trivial. Aortic regurgitation PHT measures 434 msec. No aortic stenosis is present. Aortic valve mean gradient measures 6.0 mmHg. Aortic valve peak gradient measures 10.8 mmHg. Aortic valve area, by VTI measures 2.35 cm. Pulmonic Valve: The pulmonic valve was normal in structure. Pulmonic valve regurgitation is not visualized. No evidence of pulmonic stenosis. Aorta: The aortic root is normal in size and structure. Venous: The inferior vena cava is normal in size with greater than 50% respiratory variability, suggesting right atrial pressure of 3 mmHg. IAS/Shunts: No atrial level shunt detected by color flow Doppler.  LEFT VENTRICLE PLAX 2D LVIDd:         3.30 cm LVIDs:         2.60 cm LV PW:         1.50 cm LV IVS:        1.60 cm LVOT diam:     2.20 cm LV SV:         64 LV SV Index:   31 LVOT Area:     3.80 cm  RIGHT VENTRICLE TAPSE (M-mode): 1.8 cm AORTIC VALVE AV Area (Vmax):    2.53 cm AV Area (Vmean):   2.59 cm AV Area (VTI):     2.35 cm AV Vmax:           164.00 cm/s AV Vmean:          107.000 cm/s AV VTI:            0.271 m AV Peak Grad:      10.8 mmHg AV Mean Grad:      6.0 mmHg LVOT Vmax:         109.20 cm/s LVOT Vmean:        72.850 cm/s LVOT VTI:          0.167 m LVOT/AV VTI ratio: 0.62 AI PHT:            434 msec  AORTA Ao Root diam: 4.30 cm TRICUSPID VALVE TR Peak grad:   42.0 mmHg TR Vmax:        324.00 cm/s  SHUNTS Systemic VTI:  0.17 m Systemic Diam: 2.20 cm Chilton Si MD Electronically signed by Chilton Si MD Signature Date/Time: 07/30/2022/3:47:49 PM    Final    DG Abd 1 View  Result Date: 07/30/2022 CLINICAL DATA:  Small bowel obstruction. EXAM: ABDOMEN - 1 VIEW COMPARISON:  Jul 29, 2022. FINDINGS: Stable marked gaseous distention of colon is noted. No definite small  bowel dilatation is noted. Distal tip of nasogastric tube is seen in expected position of proximal stomach. IMPRESSION: Stable gaseous distention of colon most consistent with ileus or distal colonic obstruction. Electronically Signed   By: Lupita Raider M.D.   On: 07/30/2022 09:04   DG Ankle Complete Right  Result Date: 07/29/2022 CLINICAL DATA:  Ankle wound. EXAM: RIGHT ANKLE - COMPLETE 3+ VIEW COMPARISON:  None Available. FINDINGS: There is no evidence of fracture, dislocation, or joint effusion. There is no evidence of arthropathy or other focal bone abnormality. Vascular calcifications are noted. No definite lytic destruction is noted. No definite soft tissue wound is noted. IMPRESSION: No acute abnormality. Electronically Signed   By: Lupita Raider M.D.   On: 07/29/2022 10:39   CT Angio Chest PE W and/or Wo Contrast  Result Date: 07/29/2022 CLINICAL DATA:  Dyspnea, shortness of breath. 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 EXAM: CT ANGIOGRAPHY CHEST CT ABDOMEN AND PELVIS WITH CONTRAST TECHNIQUE: Multidetector CT imaging of the chest was performed using the standard protocol during bolus administration of intravenous contrast. Multiplanar CT image reconstructions and MIPs were obtained to evaluate the vascular anatomy. Multidetector CT imaging of the abdomen and pelvis was performed using the standard protocol during bolus administration of intravenous contrast. RADIATION DOSE REDUCTION: This exam was performed according to the departmental dose-optimization program which includes automated exposure control, adjustment of the mA and/or kV according to patient size and/or use of iterative reconstruction technique. CONTRAST:  OMNIPAQUE IOHEXOL 350 MG/ML SOLN COMPARISON:  Standard CT with contrast 04/14/2022. Chest x-ray earlier 07/29/2018. PET-CT scan 05/20/2018 FINDINGS: CTA CHEST FINDINGS Cardiovascular: There is some mild breathing motion throughout the examination.  Heart is nonenlarged. No pericardial effusion. Coronary artery calcifications are seen. The thoracic aorta has a overall normal caliber with some scattered atherosclerotic plaque. There is some pulsation artifact along the ascending aorta. There is some enlargement of the main pulmonary artery. Please correlate for pulmonary artery hypertension. There is a left lower lobe pulmonary embolism along segmental vessel. No other substantial pulmonary emboli are identified. Minimal clot burden. Mediastinum/Nodes: No specific abnormal lymph node enlargement identified in the axillary regions, hilum or mediastinum. Preserved thyroid gland. Enteric tube along the esophagus with tip extending into the upper stomach. Lungs/Pleura: Breathing motion. No consolidation, pneumothorax or effusion. Mild linear opacity at the bases likely scar or atelectasis. Musculoskeletal: Curvature and degenerative changes along the spine. Review of the MIP images confirms the above findings. CT ABDOMEN and PELVIS FINDINGS Hepatobiliary: Stable small hepatic benign-appearing cystic lesions. No specific imaging follow-up. Gallbladder is contracted. Patent portal vein. Pancreas: Mild global pancreatic atrophy.  No pancreatic mass. Spleen: Normal in size without focal abnormality. Adrenals/Urinary Tract: The adrenal glands are preserved. 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 No specific imaging follow-up. No collecting system dilatation. The ureters have normal course and caliber extending down to the bladder. Bladder has preserved contour but a portion of the right side of the bladder extends into the right separate inguinal hernia. Stomach/Bowel: On this non oral contrast exam the stomach is nondilated. Small bowel is nondilated. Large bowel is dilated. Right-sided has dimensions approaching up to 10 cm in diameter. There are air-fluid levels. The transverse colon is slightly less dilated as is the descending colon with some  scattered diverticula along the descending and sigmoid colon. The previous left inguinal hernia which contain only fat, now involves the sigmoid colon and there is a transition from the dilatation to the nondilated at the level of the inguinal hernia. Please correlate with reduced stability. There is a separate large right inguinal hernia which was seen previous involving mostly the bladder but also significant small bowel and mesenteric fat. Again the small bowel is nondilated. Vascular/Lymphatic: Aortic atherosclerosis. No enlarged abdominal or pelvic lymph nodes. Reproductive: Prostate is unremarkable. Other:  Anasarca.  No free intra-abdominal air or free fluid. Musculoskeletal: Streak artifact related to the patient's bilateral dynamic hip screws. Degenerative changes of the spine and pelvis. Once again there are scattered lytic lesions along the pelvis and lumbar spine as previously described. Review of the MIP images confirms the above findings. Critical Value/emergent results were called by telephone at the time of interpretation on 07/29/2022 at 7:34 am to provider Vivien Rossetti , who verbally acknowledged these results. IMPRESSION: Left lower lobe segmental pulmonary embolism. Overall minimal clot burden. Breathing motion. Dilated colon with air-fluid level seen and scattered colonic diverticula. Transition seen in the area of the new left inguinal hernia involving the sigmoid colon. Please correlate for reduce ability. No pneumatosis, free air or fluid. Small bowel and stomach are nondilated but there is an enteric tube. Separate large known right inguinal hernia involving small bowel and significant portion of the urinary bladder. Enteric tube in the stomach. Lytic bone lesions consistent with known history of myeloma. Electronically Signed   By: Karen Kays M.D.   On: 07/29/2022 10:38   CT ABDOMEN PELVIS W CONTRAST  Result Date: 07/29/2022 CLINICAL DATA:  Dyspnea, shortness of breath. 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 EXAM: CT ANGIOGRAPHY CHEST CT ABDOMEN AND PELVIS WITH CONTRAST TECHNIQUE: Multidetector CT imaging of the chest was performed using the standard protocol during bolus administration of intravenous contrast. Multiplanar CT image reconstructions and MIPs were obtained to evaluate the vascular anatomy. Multidetector CT imaging of the abdomen and pelvis was performed using the standard protocol during bolus administration of intravenous contrast. RADIATION DOSE REDUCTION: This exam was performed according to the departmental dose-optimization program which includes automated exposure control, adjustment of the mA and/or kV according to patient size and/or use of iterative reconstruction technique. CONTRAST:  OMNIPAQUE IOHEXOL 350 MG/ML SOLN COMPARISON:  Standard CT with contrast 04/14/2022. Chest x-ray earlier 07/29/2018. PET-CT scan 05/20/2018 FINDINGS: CTA CHEST FINDINGS Cardiovascular: There is some mild breathing motion throughout the examination. Heart is nonenlarged. No pericardial effusion. Coronary artery calcifications are seen. The thoracic aorta has a overall normal caliber with some scattered atherosclerotic plaque. There is some pulsation artifact along the ascending aorta. There is some enlargement of the main pulmonary artery. Please correlate for pulmonary artery hypertension. There is a left lower lobe pulmonary embolism along segmental vessel. No other substantial pulmonary emboli are identified. Minimal clot burden. Mediastinum/Nodes: No specific abnormal lymph node enlargement identified in the axillary regions, hilum or mediastinum. Preserved thyroid gland. Enteric tube along the esophagus with tip extending into the upper stomach. Lungs/Pleura: Breathing motion. No consolidation, pneumothorax or effusion. Mild linear opacity at the bases likely scar or atelectasis. Musculoskeletal: Curvature and degenerative changes along the spine. Review of  the MIP images confirms the above findings. CT ABDOMEN and PELVIS FINDINGS Hepatobiliary: Stable small hepatic benign-appearing cystic lesions. No specific imaging follow-up. Gallbladder is contracted. Patent portal vein. Pancreas: Mild global pancreatic atrophy.  No pancreatic mass. Spleen: Normal in size without focal abnormality. Adrenals/Urinary Tract: The adrenal glands are preserved. 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 No specific imaging follow-up. No collecting system dilatation. The ureters have normal course and caliber extending down to the bladder. Bladder has preserved contour but a portion of the right side of the bladder extends into the right separate inguinal hernia. Stomach/Bowel: On this non oral contrast exam the stomach is nondilated. Small bowel is nondilated. Large bowel is dilated. Right-sided has dimensions approaching up to 10 cm  in diameter. There are air-fluid levels. The transverse colon is slightly less dilated as is the descending colon with some scattered diverticula along the descending and sigmoid colon. The previous left inguinal hernia which contain only fat, now involves the sigmoid colon and there is a transition from the dilatation to the nondilated at the level of the inguinal hernia. Please correlate with reduced stability. There is a separate large right inguinal hernia which was seen previous involving mostly the bladder but also significant small bowel and mesenteric fat. Again the small bowel is nondilated. Vascular/Lymphatic: Aortic atherosclerosis. No enlarged abdominal or pelvic lymph nodes. Reproductive: Prostate is unremarkable. Other: Anasarca.  No free intra-abdominal air or free fluid. Musculoskeletal: Streak artifact related to the patient's bilateral dynamic hip screws. Degenerative changes of the spine and pelvis. Once again there are scattered lytic lesions along the pelvis and lumbar spine as previously described. Review of the MIP  images confirms the above findings. Critical Value/emergent results were called by telephone at the time of interpretation on 07/29/2022 at 7:34 am to provider Vivien Rossetti , who verbally acknowledged these results. IMPRESSION: Left lower lobe segmental pulmonary embolism. Overall minimal clot burden. Breathing motion. Dilated colon with air-fluid level seen and scattered colonic diverticula. Transition seen in the area of the new left inguinal hernia involving the sigmoid colon. Please correlate for reduce ability. No pneumatosis, free air or fluid. Small bowel and stomach are nondilated but there is an enteric tube. Separate large known right inguinal hernia involving small bowel and significant portion of the urinary bladder. Enteric tube in the stomach. Lytic bone lesions consistent with known history of myeloma. Electronically Signed   By: Karen Kays M.D.   On: 07/29/2022 10:38   DG Abd 1 View  Result Date: 07/29/2022 CLINICAL DATA:  NG tube placement EXAM: ABDOMEN - 1 VIEW COMPARISON:  Same day CT abdomen/pelvis FINDINGS: The enteric catheter tip and sidehole project over the expected location of the stomach. There is marked gaseous distention of the large bowel as seen on same-day CT. There is no definite free intraperitoneal air, within the confines of supine technique. IMPRESSION: Enteric catheter tip and sidehole project over the stomach. Electronically Signed   By: Lesia Hausen M.D.   On: 07/29/2022 10:34   DG Chest 2 View  Result Date: 07/29/2022 CLINICAL DATA:  shortness of breath, wheezing EXAM: CHEST - 2 VIEW COMPARISON:  PET-CT March 21, 24. FINDINGS: The heart size and mediastinal contours are within normal limits. Both lungs are clear. No visible pleural effusions or pneumothorax. The visualized skeletal structures are unremarkable. Gaseous distension of visualized bowel loops in the upper abdomen. Recommend dedicated KUB to further evaluate. IMPRESSION: 1. No active cardiopulmonary  disease. 2. Gaseous distension of visualized bowel loops in the upper abdomen. Recommend dedicated KUB to further evaluate. Electronically Signed   By: Feliberto Harts M.D.   On: 07/29/2022 08:56     Subjective: No acute issues/events overnight   Discharge Exam: Vitals:   08/16/22 0635 08/16/22 0806  BP: 121/61   Pulse: 65   Resp: 16   Temp: 98.1 F (36.7 C)   SpO2: 100% 99%   Vitals:   08/15/22 1923 08/15/22 2130 08/16/22 0635 08/16/22 0806  BP:  121/74 121/61   Pulse:  73 65   Resp:  14 16   Temp:  98.3 F (36.8 C) 98.1 F (36.7 C)   TempSrc:   Oral   SpO2: 96% 100% 100% 99%  Weight:  Height:        General: Pt is alert, awake, not in acute distress Cardiovascular: RRR, S1/S2 +, no rubs, no gallops Respiratory: CTA bilaterally, no wheezing, no rhonchi Abdominal: Soft, NT, ND, bowel sounds + Extremities: no edema, no cyanosis; pressure injuries as above (buttocks/heel)   The results of significant diagnostics from this hospitalization (including imaging, microbiology, ancillary and laboratory) are listed below for reference.     Microbiology: Recent Results (from the past 240 hour(s))  C Difficile Quick Screen (NO PCR Reflex)     Status: None   Collection Time: 08/07/22  4:46 AM   Specimen: STOOL  Result Value Ref Range Status   C Diff antigen NEGATIVE NEGATIVE Final   C Diff toxin NEGATIVE NEGATIVE Final   C Diff interpretation No C. difficile detected.  Final    Comment: Performed at [facility], [address], [place], [place] [address]  Gastrointestinal Panel by PCR , Stool     Status: None   Collection Time: 08/07/22  4:46 AM   Specimen: STOOL  Result Value Ref Range Status   Campylobacter species NOT DETECTED NOT DETECTED Final   Plesimonas shigelloides NOT DETECTED NOT DETECTED Final   Salmonella species NOT DETECTED NOT DETECTED Final   Yersinia enterocolitica NOT DETECTED NOT DETECTED Final   Vibrio species NOT DETECTED  NOT DETECTED Final   Vibrio cholerae NOT DETECTED NOT DETECTED Final   Enteroaggregative E coli (EAEC) NOT DETECTED NOT DETECTED Final   Enteropathogenic E coli (EPEC) NOT DETECTED NOT DETECTED Final   Enterotoxigenic E coli (ETEC) NOT DETECTED NOT DETECTED Final   Shiga like toxin producing E coli (STEC) NOT DETECTED NOT DETECTED Final   Shigella/Enteroinvasive E coli (EIEC) NOT DETECTED NOT DETECTED Final   Cryptosporidium NOT DETECTED NOT DETECTED Final   Cyclospora cayetanensis NOT DETECTED NOT DETECTED Final   Entamoeba histolytica NOT DETECTED NOT DETECTED Final   Giardia lamblia NOT DETECTED NOT DETECTED Final   Adenovirus F40/41 NOT DETECTED NOT DETECTED Final   Astrovirus NOT DETECTED NOT DETECTED Final   Norovirus GI/GII NOT DETECTED NOT DETECTED Final   Rotavirus A NOT DETECTED NOT DETECTED Final   Sapovirus (I, II, IV, and V) NOT DETECTED NOT DETECTED Final    Comment: Performed at [facility], [address] [place], [place] [address]     Labs: BNP (last 3 results) Recent Labs    07/29/22 0835 07/30/22 2304 08/09/22 1713  BNP 145.9* 357.6* 671.3*   Basic Metabolic Panel: Recent Labs  Lab 08/11/22 0541 08/12/22 0221 08/13/22 0332 08/14/22 0309 08/15/22 0309 08/16/22 0406  NA 139 142 141 139 138  --   K 3.1* 4.0 4.6 4.2 4.2  --   CL 111 115* 115* 112* 113*  --   CO2 20* 20* 20* 19* 20*  --   GLUCOSE 75 77 112* 62* 66*  --   BUN <5* <5* <5* <5* <5*  --   CREATININE 0.94 1.05 1.03 1.08 0.95  --   CALCIUM 7.2* 7.3* 7.2* 7.1* 6.8*  --   MG 1.7 1.7 1.4* 1.6* 1.4* 1.5*  PHOS 2.3* 2.3* 1.6* 1.7* 2.3* 2.5   Liver Function Tests: Recent Labs  Lab 08/10/22 0531  ALBUMIN 2.5*   No results for input(s): "LIPASE", "AMYLASE" in the last 168 hours. No results for input(s): "AMMONIA" in the last 168 hours. CBC: Recent Labs  Lab 08/10/22 0531 08/12/22 0221 08/13/22 0332 08/14/22 0925  WBC 9.3 4.9 4.4 5.0  HGB 7.6* 7.0* 7.2* 7.2*  HCT 22.6* 21.3*  22.2* 21.9*  MCV 94.6 95.1 94.9 95.6  PLT 180 199 230 249   Cardiac Enzymes: No results for input(s): "CKTOTAL", "CKMB", "CKMBINDEX", "TROPONINI" in the last 168 hours. BNP: Invalid input(s): "POCBNP" CBG: Recent Labs  Lab 08/15/22 0728 08/15/22 1240 08/15/22 1801 08/15/22 2128 08/16/22 0632  GLUCAP 70 114* 94 108* 94   D-Dimer No results for input(s): "DDIMER" in the last 72 hours. Hgb A1c No results for input(s): "HGBA1C" in the last 72 hours. Lipid Profile No results for input(s): "CHOL", "HDL", "LDLCALC", "TRIG", "CHOLHDL", "LDLDIRECT" in the last 72 hours. Thyroid function studies No results for input(s): "TSH", "T4TOTAL", "T3FREE", "THYROIDAB" in the last 72 hours.  Invalid input(s): "FREET3" Anemia work up No results for input(s): "VITAMINB12", "FOLATE", "FERRITIN", "TIBC", "IRON", "RETICCTPCT" in the last 72 hours. Urinalysis    Component Value Date/Time   COLORURINE STRAW (A) 07/29/2022 1856   APPEARANCEUR CLEAR 07/29/2022 1856   LABSPEC 1.020 07/29/2022 1856   PHURINE 5.0 07/29/2022 1856   GLUCOSEU NEGATIVE 07/29/2022 1856   HGBUR NEGATIVE 07/29/2022 1856   BILIRUBINUR NEGATIVE 07/29/2022 1856   KETONESUR NEGATIVE 07/29/2022 1856   PROTEINUR NEGATIVE 07/29/2022 1856   NITRITE NEGATIVE 07/29/2022 1856   LEUKOCYTESUR NEGATIVE 07/29/2022 1856   Sepsis Labs Recent Labs  Lab 08/10/22 0531 08/12/22 0221 08/13/22 0332 08/14/22 0925  WBC 9.3 4.9 4.4 5.0   Microbiology Recent Results (from the past 240 hour(s))  C Difficile Quick Screen (NO PCR Reflex)     Status: None   Collection Time: 08/07/22  4:46 AM   Specimen: STOOL  Result Value Ref Range Status   C Diff antigen NEGATIVE NEGATIVE Final   C Diff toxin NEGATIVE NEGATIVE Final   C Diff interpretation No C. difficile detected.  Final    Comment: Performed at [facility], [address] [place], [place] [address]  Gastrointestinal Panel by PCR , Stool     Status: None    Collection Time: 08/07/22  4:46 AM   Specimen: STOOL  Result Value Ref Range Status   Campylobacter species NOT DETECTED NOT DETECTED Final   Plesimonas shigelloides NOT DETECTED NOT DETECTED Final   Salmonella species NOT DETECTED NOT DETECTED Final   Yersinia enterocolitica NOT DETECTED NOT DETECTED Final   Vibrio species NOT DETECTED NOT DETECTED Final   Vibrio cholerae NOT DETECTED NOT DETECTED Final   Enteroaggregative E coli (EAEC) NOT DETECTED NOT DETECTED Final   Enteropathogenic E coli (EPEC) NOT DETECTED NOT DETECTED Final   Enterotoxigenic E coli (ETEC) NOT DETECTED NOT DETECTED Final   Shiga like toxin producing E coli (STEC) NOT DETECTED NOT DETECTED Final   Shigella/Enteroinvasive E coli (EIEC) NOT DETECTED NOT DETECTED Final   Cryptosporidium NOT DETECTED NOT DETECTED Final   Cyclospora cayetanensis NOT DETECTED NOT DETECTED Final   Entamoeba histolytica NOT DETECTED NOT DETECTED Final   Giardia lamblia NOT DETECTED NOT DETECTED Final   Adenovirus F40/41 NOT DETECTED NOT DETECTED Final   Astrovirus NOT DETECTED NOT DETECTED Final   Norovirus GI/GII NOT DETECTED NOT DETECTED Final   Rotavirus A NOT DETECTED NOT DETECTED Final   Sapovirus (I, II, IV, and V) NOT DETECTED NOT DETECTED Final    Comment: Performed at [facility], [address], [place], [place] [address]     Time coordinating discharge: Over 30 minutes  SIGNED:   Azucena Fallen, DO [facility] 08/16/2022, 9:55 AM Pager   If 7PM-7AM,  please contact night-coverage [email or web address]

## 2022-08-16 NOTE — Progress Notes (Signed)
RA DL PICC removed per protocol per MD order. Manual pressure applied for 5 mins. Vaseline gauze, gauze, and Tegaderm applied over insertion site. No bleeding or swelling at site noted. Generalized right arm edema noted (pt verbalized the swelling to right arm has been present for a long time and prior to PICC insertion). Instructed patient to remain in bed for thirty mins. Educated patient and his wife about S/S of infection and when to call MD; no heavy lifting or pressure on right side for 24 hours; keep dressing dry and intact for 24 hours. Pt and wife verbalized comprehension.

## 2022-08-16 NOTE — TOC Transition Note (Signed)
Transition of Care [facility]) - CM/SW Discharge Note   Patient Details  Name: Frank Hernandez MRN: [id number] Date of Birth: 1940-12-26  [facility] CM/SW Contact:  Otelia Santee, LCSW Phone Number: 08/16/2022, 10:16 AM   Clinical Narrative:    Pt to transfer to [facility] for ST SNF. Pt will be going to room 117b. RN to call report to [phone]. Spoke with pt's wife and confirmed discharge plans. Pt to be transported to facility via [facility]. [facility] called for transportation at 10:24am. DC packet made and placed at RN station.    Final next level of care: Skilled Nursing Facility Barriers to Discharge: Barriers Resolved   Patient Goals and CMS Choice CMS Medicare.gov Compare Post Acute Care list provided to:: Patient Choice offered to / list presented to : Patient  Discharge Placement     Existing PASRR number confirmed : 08/12/22          Patient chooses bed at: [facility] Patient to be transferred to facility by: [facility] Name of family member notified: [name] Patient and family notified of of transfer: 08/16/22  Discharge Plan and Services Additional resources added to the After Visit Summary for     Discharge Planning Services: CM Consult            DME Arranged: N/A DME Agency: NA                  Social Determinants of Health (SDOH) Interventions SDOH Screenings   Food Insecurity: No Food Insecurity (07/30/2022)  Recent Concern: Food Insecurity - Food Insecurity Present (05/28/2022)  Housing: Low Risk  (07/30/2022)  Transportation Needs: No Transportation Needs (07/30/2022)  Recent Concern: Transportation Needs - Unmet Transportation Needs (05/28/2022)  Utilities: Not At Risk (07/30/2022)  Depression (PHQ2-9): Low Risk  (04/29/2022)  Tobacco Use: Low Risk  (08/12/2022)     Readmission Risk Interventions    08/16/2022   10:15 AM 08/03/2022    8:39 AM 04/14/2022    1:48 PM  Readmission Risk Prevention Plan   Transportation Screening Complete Complete Complete  PCP or Specialist Appt within 5-7 Days   Complete  Home Care Screening   Complete  Medication Review (RN CM)   Complete  Medication Review (RN Care Manager) Complete Complete   PCP or Specialist appointment within 3-5 days of discharge Complete    HRI or Home Care Consult Complete Complete   SW Recovery Care/Counseling Consult Complete Complete   Palliative Care Screening Not Applicable Not Applicable   Skilled Nursing Facility Complete Complete

## 2022-08-16 NOTE — Consult Note (Signed)
   [facility] Inpatient Consult   08/16/2022  Frank Hernandez 14-Oct-1940 [id number]  [facility]) Accountable Care Organization (ACO) [facility] Liaison Note   Location: [facility] Liaison screened the patient remotely at [facility]  Insurance: [employer] Medicare   Frank Hernandez is a 82 y.o. male who is a Primary Care Patient of [name] (Inactive).   The patient was screened for  day readmission hospitalization with noted extreme high risk score for unplanned readmission risk with 4IP/1ED in 6 months. LLOS 18 days   The patient was assessed for potential [facility]) Care Management service needs for post hospital transition for care coordination. Review of patient's electronic medical record reveals patient has been followed by the Oncology Team and at a SNF pta. Patient was given an appointment reminder card and 24 hour Nurse Advice Line magnet.   Plan: This [facility] Liaison will continue to follow progress and disposition to asess for post hospital community care coordination/management needs.  Referral request for community care coordination:  No current follow up at this time as patient is to transition to a higher level of care at [facility].   [facility] Care Management/Population Health does not replace or interfere with any arrangements made by the Inpatient Transition of Care team.   For questions contact:   Charlesetta Shanks, RN BSN CCM [facility]  [phone] business mobile phone Toll free office [phone]  *Concierge Line  [phone] Fax number: [phone] [email or web address]

## 2022-08-16 NOTE — Progress Notes (Signed)
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  Pt with history of remote bladder cancer, pt underwent IM nail R femur d/t pathological fx +Bx on 04/17/22 hospitalized diagnosed with multiple myeloma, CKD, hypertension recently started on treatment for multiple myeloma and admitted 07/29/22    PT Comments     Pt admitted with above diagnosis.  Pt currently with functional limitations due to the deficits listed below (see PT Problem List). Pt in bed when PT arrived. Pt agreeable to getting OOB. PT noted R LE bandaged, R UE edema and L LE edema. MD and nurse made aware. Pt required increased time, use of hospital bed and cues for supine to sit with S, pt able to scoot to EOB with cues, STS from elevated EOB with mod A and cues and gait 8 feet with RW, cues, recliner close and encouragement, pt amb with slow cadence, shuffling pattern with narrow BOS. Pt reports d/c today to SNF for continued rehab services. Pt will benefit from acute skilled PT to increase their independence and safety with mobility to allow discharge.     Recommendations for follow up therapy are one component of a multi-disciplinary discharge planning process, led by the attending physician.  Recommendations may be updated based on patient status, additional functional criteria and insurance authorization.  Follow Up Recommendations  Can patient physically be transported by private vehicle: No    Assistance Recommended at Discharge Frequent or constant Supervision/Assistance  Patient can return home  with the following A lot of help with walking and/or transfers;A lot of help with bathing/dressing/bathroom;Assistance with cooking/housework;Assist for transportation;Help with stairs or ramp for entrance   Equipment Recommendations  None recommended by PT    Recommendations for Other Services       Precautions / Restrictions Precautions Precautions: Fall Precaution Comments: Flexi Seal Restrictions Weight Bearing Restrictions: No     Mobility  Bed Mobility Overal bed mobility: Needs Assistance Bed Mobility: Supine to Sit Rolling: Supervision   Supine to sit: Supervision     General bed mobility comments: min cues, increased time use of hospital bed    Transfers Overall transfer level: Needs assistance Equipment used: Rolling walker (2 wheels) Transfers: Sit to/from Stand Sit to Stand: Mod assist, From elevated surface           General transfer comment: cues for push to stand    Ambulation/Gait Ambulation/Gait assistance: Min guard Gait Distance (Feet): 8 Feet Assistive device: Rolling walker (2 wheels) Gait Pattern/deviations: Step-to pattern, Decreased stride length, Shuffle, Trunk flexed, Narrow base of support Gait velocity: decreased     General Gait Details: encouragement for gait tasks, recliner close   Stairs             Wheelchair Mobility    Modified Rankin (Stroke Patients Only)       Balance Overall balance assessment: Needs assistance Sitting-balance support: Feet supported, No upper extremity supported Sitting balance-Leahy Scale: Fair Sitting balance - Comments: could sit without assist   Standing balance support: Bilateral upper extremity supported, Reliant on assistive device for balance, During functional activity Standing balance-Leahy Scale: Poor Standing balance comment: Requiring RW and min A  Cognition Arousal/Alertness: Awake/alert Behavior During Therapy: WFL for tasks  assessed/performed Overall Cognitive Status: Within Functional Limits for tasks assessed                                 General 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  Following all directions.  Feeling "better".  Motivated.        Exercises      General Comments General comments (skin integrity, edema, etc.): R foot bandaged, LLE edema and R UE edema      Pertinent Vitals/Pain Pain Assessment Pain Assessment: No/denies pain    Home Living Family/patient expects to be discharged to:: Unsure Living Arrangements: Spouse/significant other Available Help at Discharge: Family Type of Home: House Home Access: Ramped entrance       Home Layout: One level Home Equipment: Agricultural consultant (2 wheels);BSC/3in1;Wheelchair - manual Additional Comments: Pt sleeps in his Lift recliner.  BSC atop toilet.    Prior Function            PT Goals (current goals can now be found in the care plan section) Acute Rehab PT Goals PT Goal Formulation: With patient Time For Goal Achievement: 08/14/22 Potential to Achieve Goals: Good    Frequency    Min 1X/week      PT Plan      Co-evaluation              AM-PAC PT "6 Clicks" Mobility   Outcome Measure  Help needed turning from your back to your side while in a flat bed without using bedrails?: A Little Help needed moving from lying on your back to sitting on the side of a flat bed without using bedrails?: A Little Help needed moving to and from a bed to a chair (including a wheelchair)?: A Lot Help needed standing up from a chair using your arms (e.g., wheelchair or bedside chair)?: A Lot Help needed to walk in hospital room?: A Lot Help needed climbing 3-5 steps with a railing? : Total 6 Click Score: 13    End of Session   Activity Tolerance: Patient tolerated treatment well Patient left: with chair alarm set;in chair;with call bell/phone within reach Nurse Communication: Mobility status PT Visit  Diagnosis: Difficulty in walking, not elsewhere classified (R26.2);Muscle weakness (generalized) (M62.81)     Time: 1000-1024 PT Time Calculation (min) (ACUTE ONLY): 24 min  Charges:  $Gait Training: 8-22 mins $Therapeutic Activity: 8-22 mins                     Johnny Bridge, PT Acute Rehab    Frank Hernandez 08/16/2022, 11:25 AM

## 2022-08-18 DIAGNOSIS — R197 Diarrhea, unspecified: Secondary | ICD-10-CM | POA: Diagnosis not present

## 2022-08-18 DIAGNOSIS — C7951 Secondary malignant neoplasm of bone: Secondary | ICD-10-CM | POA: Diagnosis not present

## 2022-08-18 DIAGNOSIS — L89610 Pressure ulcer of right heel, unstageable: Secondary | ICD-10-CM | POA: Diagnosis not present

## 2022-08-18 DIAGNOSIS — D509 Iron deficiency anemia, unspecified: Secondary | ICD-10-CM | POA: Diagnosis not present

## 2022-08-18 DIAGNOSIS — C9000 Multiple myeloma not having achieved remission: Secondary | ICD-10-CM | POA: Diagnosis not present

## 2022-08-18 DIAGNOSIS — E785 Hyperlipidemia, unspecified: Secondary | ICD-10-CM | POA: Diagnosis not present

## 2022-08-20 DIAGNOSIS — M25551 Pain in right hip: Secondary | ICD-10-CM | POA: Diagnosis not present

## 2022-08-20 DIAGNOSIS — I10 Essential (primary) hypertension: Secondary | ICD-10-CM | POA: Diagnosis not present

## 2022-08-20 DIAGNOSIS — N1831 Chronic kidney disease, stage 3a: Secondary | ICD-10-CM | POA: Diagnosis not present

## 2022-08-20 DIAGNOSIS — S7221XD Displaced subtrochanteric fracture of right femur, subsequent encounter for closed fracture with routine healing: Secondary | ICD-10-CM | POA: Diagnosis not present

## 2022-08-20 DIAGNOSIS — M25561 Pain in right knee: Secondary | ICD-10-CM | POA: Diagnosis not present

## 2022-08-20 DIAGNOSIS — M25461 Effusion, right knee: Secondary | ICD-10-CM | POA: Diagnosis not present

## 2022-08-20 DIAGNOSIS — L89610 Pressure ulcer of right heel, unstageable: Secondary | ICD-10-CM | POA: Diagnosis not present

## 2022-08-20 DIAGNOSIS — M1711 Unilateral primary osteoarthritis, right knee: Secondary | ICD-10-CM | POA: Diagnosis not present

## 2022-08-20 DIAGNOSIS — L853 Xerosis cutis: Secondary | ICD-10-CM | POA: Diagnosis not present

## 2022-08-21 DIAGNOSIS — R630 Anorexia: Secondary | ICD-10-CM | POA: Diagnosis not present

## 2022-08-21 DIAGNOSIS — E46 Unspecified protein-calorie malnutrition: Secondary | ICD-10-CM | POA: Diagnosis not present

## 2022-08-23 DIAGNOSIS — C9000 Multiple myeloma not having achieved remission: Secondary | ICD-10-CM | POA: Diagnosis not present

## 2022-08-23 DIAGNOSIS — K529 Noninfective gastroenteritis and colitis, unspecified: Secondary | ICD-10-CM | POA: Diagnosis not present

## 2022-08-26 DIAGNOSIS — L602 Onychogryphosis: Secondary | ICD-10-CM | POA: Diagnosis not present

## 2022-08-26 DIAGNOSIS — L853 Xerosis cutis: Secondary | ICD-10-CM | POA: Diagnosis not present

## 2022-08-26 DIAGNOSIS — R531 Weakness: Secondary | ICD-10-CM | POA: Diagnosis not present

## 2022-08-26 DIAGNOSIS — E1159 Type 2 diabetes mellitus with other circulatory complications: Secondary | ICD-10-CM | POA: Diagnosis not present

## 2022-08-27 ENCOUNTER — Inpatient Hospital Stay: Payer: Medicare PPO

## 2022-09-03 DIAGNOSIS — R29898 Other symptoms and signs involving the musculoskeletal system: Secondary | ICD-10-CM | POA: Diagnosis not present

## 2022-09-03 DIAGNOSIS — C9000 Multiple myeloma not having achieved remission: Secondary | ICD-10-CM | POA: Diagnosis not present

## 2022-09-03 DIAGNOSIS — R197 Diarrhea, unspecified: Secondary | ICD-10-CM | POA: Diagnosis not present

## 2022-09-06 DIAGNOSIS — L853 Xerosis cutis: Secondary | ICD-10-CM | POA: Diagnosis not present

## 2022-09-06 DIAGNOSIS — R634 Abnormal weight loss: Secondary | ICD-10-CM | POA: Diagnosis not present

## 2022-09-06 DIAGNOSIS — C9000 Multiple myeloma not having achieved remission: Secondary | ICD-10-CM | POA: Diagnosis not present

## 2022-09-07 DIAGNOSIS — R5383 Other fatigue: Secondary | ICD-10-CM | POA: Diagnosis not present

## 2022-09-07 DIAGNOSIS — R29898 Other symptoms and signs involving the musculoskeletal system: Secondary | ICD-10-CM | POA: Diagnosis not present

## 2022-09-07 DIAGNOSIS — R630 Anorexia: Secondary | ICD-10-CM | POA: Diagnosis not present

## 2022-09-07 DIAGNOSIS — C7951 Secondary malignant neoplasm of bone: Secondary | ICD-10-CM | POA: Diagnosis not present

## 2022-09-08 ENCOUNTER — Inpatient Hospital Stay (HOSPITAL_COMMUNITY)
Admission: EM | Admit: 2022-09-08 | Discharge: 2022-09-14 | DRG: 640 | Disposition: A | Discharge status: Skilled Nursing Facility | Payer: Medicare PPO | Source: Skilled Nursing Facility | Attending: Family Medicine | Admitting: Family Medicine

## 2022-09-08 ENCOUNTER — Emergency Department (HOSPITAL_COMMUNITY): Payer: Medicare PPO

## 2022-09-08 ENCOUNTER — Inpatient Hospital Stay (HOSPITAL_COMMUNITY): Payer: Medicare PPO

## 2022-09-08 ENCOUNTER — Other Ambulatory Visit: Payer: Self-pay

## 2022-09-08 DIAGNOSIS — M47816 Spondylosis without myelopathy or radiculopathy, lumbar region: Secondary | ICD-10-CM | POA: Diagnosis not present

## 2022-09-08 DIAGNOSIS — E875 Hyperkalemia: Principal | ICD-10-CM | POA: Diagnosis present

## 2022-09-08 DIAGNOSIS — L819 Disorder of pigmentation, unspecified: Secondary | ICD-10-CM | POA: Diagnosis present

## 2022-09-08 DIAGNOSIS — Z7901 Long term (current) use of anticoagulants: Secondary | ICD-10-CM | POA: Diagnosis not present

## 2022-09-08 DIAGNOSIS — I2609 Other pulmonary embolism with acute cor pulmonale: Secondary | ICD-10-CM | POA: Diagnosis not present

## 2022-09-08 DIAGNOSIS — G238 Other specified degenerative diseases of basal ganglia: Secondary | ICD-10-CM | POA: Diagnosis not present

## 2022-09-08 DIAGNOSIS — E860 Dehydration: Secondary | ICD-10-CM | POA: Diagnosis not present

## 2022-09-08 DIAGNOSIS — E8721 Acute metabolic acidosis: Secondary | ICD-10-CM | POA: Diagnosis not present

## 2022-09-08 DIAGNOSIS — M6281 Muscle weakness (generalized): Secondary | ICD-10-CM | POA: Diagnosis not present

## 2022-09-08 DIAGNOSIS — R410 Disorientation, unspecified: Secondary | ICD-10-CM | POA: Diagnosis not present

## 2022-09-08 DIAGNOSIS — Z86711 Personal history of pulmonary embolism: Secondary | ICD-10-CM

## 2022-09-08 DIAGNOSIS — Z8551 Personal history of malignant neoplasm of bladder: Secondary | ICD-10-CM | POA: Diagnosis not present

## 2022-09-08 DIAGNOSIS — N179 Acute kidney failure, unspecified: Secondary | ICD-10-CM | POA: Diagnosis present

## 2022-09-08 DIAGNOSIS — R197 Diarrhea, unspecified: Secondary | ICD-10-CM | POA: Diagnosis present

## 2022-09-08 DIAGNOSIS — X58XXXA Exposure to other specified factors, initial encounter: Secondary | ICD-10-CM | POA: Diagnosis present

## 2022-09-08 DIAGNOSIS — R41841 Cognitive communication deficit: Secondary | ICD-10-CM | POA: Diagnosis not present

## 2022-09-08 DIAGNOSIS — E872 Acidosis, unspecified: Secondary | ICD-10-CM | POA: Diagnosis present

## 2022-09-08 DIAGNOSIS — R627 Adult failure to thrive: Secondary | ICD-10-CM | POA: Diagnosis present

## 2022-09-08 DIAGNOSIS — Z6824 Body mass index (BMI) 24.0-24.9, adult: Secondary | ICD-10-CM

## 2022-09-08 DIAGNOSIS — R000 Tachycardia, unspecified: Secondary | ICD-10-CM | POA: Diagnosis not present

## 2022-09-08 DIAGNOSIS — N1831 Chronic kidney disease, stage 3a: Secondary | ICD-10-CM | POA: Diagnosis present

## 2022-09-08 DIAGNOSIS — M62562 Muscle wasting and atrophy, not elsewhere classified, left lower leg: Secondary | ICD-10-CM | POA: Diagnosis present

## 2022-09-08 DIAGNOSIS — C9000 Multiple myeloma not having achieved remission: Secondary | ICD-10-CM | POA: Diagnosis present

## 2022-09-08 DIAGNOSIS — L89620 Pressure ulcer of left heel, unstageable: Secondary | ICD-10-CM | POA: Diagnosis present

## 2022-09-08 DIAGNOSIS — R4182 Altered mental status, unspecified: Secondary | ICD-10-CM | POA: Diagnosis not present

## 2022-09-08 DIAGNOSIS — G9341 Metabolic encephalopathy: Secondary | ICD-10-CM | POA: Diagnosis not present

## 2022-09-08 DIAGNOSIS — C419 Malignant neoplasm of bone and articular cartilage, unspecified: Secondary | ICD-10-CM | POA: Diagnosis present

## 2022-09-08 DIAGNOSIS — F03A0 Unspecified dementia, mild, without behavioral disturbance, psychotic disturbance, mood disturbance, and anxiety: Secondary | ICD-10-CM | POA: Diagnosis present

## 2022-09-08 DIAGNOSIS — R4701 Aphasia: Secondary | ICD-10-CM | POA: Diagnosis not present

## 2022-09-08 DIAGNOSIS — I129 Hypertensive chronic kidney disease with stage 1 through stage 4 chronic kidney disease, or unspecified chronic kidney disease: Secondary | ICD-10-CM | POA: Diagnosis present

## 2022-09-08 DIAGNOSIS — S81812A Laceration without foreign body, left lower leg, initial encounter: Secondary | ICD-10-CM | POA: Diagnosis present

## 2022-09-08 DIAGNOSIS — I10 Essential (primary) hypertension: Secondary | ICD-10-CM | POA: Diagnosis present

## 2022-09-08 DIAGNOSIS — M62561 Muscle wasting and atrophy, not elsewhere classified, right lower leg: Secondary | ICD-10-CM | POA: Diagnosis present

## 2022-09-08 DIAGNOSIS — E43 Unspecified severe protein-calorie malnutrition: Secondary | ICD-10-CM | POA: Diagnosis not present

## 2022-09-08 DIAGNOSIS — Z79899 Other long term (current) drug therapy: Secondary | ICD-10-CM

## 2022-09-08 DIAGNOSIS — R531 Weakness: Secondary | ICD-10-CM | POA: Diagnosis not present

## 2022-09-08 DIAGNOSIS — D649 Anemia, unspecified: Secondary | ICD-10-CM | POA: Diagnosis not present

## 2022-09-08 DIAGNOSIS — R930 Abnormal findings on diagnostic imaging of skull and head, not elsewhere classified: Secondary | ICD-10-CM | POA: Diagnosis not present

## 2022-09-08 DIAGNOSIS — R7303 Prediabetes: Secondary | ICD-10-CM | POA: Diagnosis present

## 2022-09-08 DIAGNOSIS — K56609 Unspecified intestinal obstruction, unspecified as to partial versus complete obstruction: Secondary | ICD-10-CM | POA: Diagnosis not present

## 2022-09-08 DIAGNOSIS — G9340 Encephalopathy, unspecified: Secondary | ICD-10-CM

## 2022-09-08 DIAGNOSIS — Z91199 Patient's noncompliance with other medical treatment and regimen due to unspecified reason: Secondary | ICD-10-CM | POA: Diagnosis not present

## 2022-09-08 DIAGNOSIS — Z993 Dependence on wheelchair: Secondary | ICD-10-CM

## 2022-09-08 DIAGNOSIS — Z0389 Encounter for observation for other suspected diseases and conditions ruled out: Secondary | ICD-10-CM | POA: Diagnosis not present

## 2022-09-08 DIAGNOSIS — G928 Other toxic encephalopathy: Secondary | ICD-10-CM | POA: Diagnosis not present

## 2022-09-08 DIAGNOSIS — L97413 Non-pressure chronic ulcer of right heel and midfoot with necrosis of muscle: Secondary | ICD-10-CM | POA: Diagnosis not present

## 2022-09-08 DIAGNOSIS — M1711 Unilateral primary osteoarthritis, right knee: Secondary | ICD-10-CM | POA: Diagnosis not present

## 2022-09-08 DIAGNOSIS — E861 Hypovolemia: Secondary | ICD-10-CM | POA: Diagnosis present

## 2022-09-08 DIAGNOSIS — R5383 Other fatigue: Secondary | ICD-10-CM | POA: Diagnosis not present

## 2022-09-08 DIAGNOSIS — Z7401 Bed confinement status: Secondary | ICD-10-CM | POA: Diagnosis not present

## 2022-09-08 LAB — CBC WITH DIFFERENTIAL/PLATELET
Abs Immature Granulocytes: 0.04 10*3/uL (ref 0.00–0.07)
Basophils Absolute: 0.1 10*3/uL (ref 0.0–0.1)
Basophils Relative: 1 %
Eosinophils Absolute: 0.1 10*3/uL (ref 0.0–0.5)
Eosinophils Relative: 1 %
HCT: 33.7 % — ABNORMAL LOW (ref 39.0–52.0)
Hemoglobin: 10.5 g/dL — ABNORMAL LOW (ref 13.0–17.0)
Immature Granulocytes: 0 %
Lymphocytes Relative: 8 %
Lymphs Abs: 0.8 10*3/uL (ref 0.7–4.0)
MCH: 30 pg (ref 26.0–34.0)
MCHC: 31.2 g/dL (ref 30.0–36.0)
MCV: 96.3 fL (ref 80.0–100.0)
Monocytes Absolute: 1 10*3/uL (ref 0.1–1.0)
Monocytes Relative: 11 %
Neutro Abs: 7.6 10*3/uL (ref 1.7–7.7)
Neutrophils Relative %: 79 %
Platelets: 388 10*3/uL (ref 150–400)
RBC: 3.5 MIL/uL — ABNORMAL LOW (ref 4.22–5.81)
RDW: 18.3 % — ABNORMAL HIGH (ref 11.5–15.5)
WBC: 9.6 10*3/uL (ref 4.0–10.5)
nRBC: 0 % (ref 0.0–0.2)

## 2022-09-08 LAB — URINALYSIS, ROUTINE W REFLEX MICROSCOPIC
Bilirubin Urine: NEGATIVE
Glucose, UA: NEGATIVE mg/dL
Ketones, ur: 5 mg/dL — AB
Leukocytes,Ua: NEGATIVE
Nitrite: NEGATIVE
Protein, ur: NEGATIVE mg/dL
Specific Gravity, Urine: 1.013 (ref 1.005–1.030)
pH: 5 (ref 5.0–8.0)

## 2022-09-08 LAB — COMPREHENSIVE METABOLIC PANEL
ALT: 20 U/L (ref 0–44)
AST: 35 U/L (ref 15–41)
Albumin: 3.8 g/dL (ref 3.5–5.0)
Alkaline Phosphatase: 145 U/L — ABNORMAL HIGH (ref 38–126)
Anion gap: 8 (ref 5–15)
BUN: 25 mg/dL — ABNORMAL HIGH (ref 8–23)
CO2: 8 mmol/L — ABNORMAL LOW (ref 22–32)
Calcium: 9.2 mg/dL (ref 8.9–10.3)
Chloride: 120 mmol/L — ABNORMAL HIGH (ref 98–111)
Creatinine, Ser: 1.32 mg/dL — ABNORMAL HIGH (ref 0.61–1.24)
GFR, Estimated: 54 mL/min — ABNORMAL LOW (ref >60)
Glucose, Bld: 94 mg/dL (ref 70–99)
Potassium: >7.5 mmol/L (ref 3.5–5.1)
Sodium: 136 mmol/L (ref 135–145)
Total Bilirubin: 0.5 mg/dL (ref 0.3–1.2)
Total Protein: 6.9 g/dL (ref 6.5–8.1)

## 2022-09-08 LAB — GLUCOSE, RANDOM: Glucose, Bld: 71 mg/dL (ref 70–99)

## 2022-09-08 LAB — RAPID URINE DRUG SCREEN, HOSP PERFORMED
Amphetamines: NOT DETECTED
Barbiturates: NOT DETECTED
Benzodiazepines: NOT DETECTED
Cocaine: NOT DETECTED
Opiates: NOT DETECTED
Tetrahydrocannabinol: NOT DETECTED

## 2022-09-08 LAB — ETHANOL: Alcohol, Ethyl (B): <10 mg/dL (ref <10)

## 2022-09-08 LAB — POTASSIUM
Potassium: >7.5 mmol/L (ref 3.5–5.1)
Potassium: 6 mmol/L — ABNORMAL HIGH (ref 3.5–5.1)

## 2022-09-08 LAB — PHOSPHORUS: Phosphorus: 2.2 mg/dL — ABNORMAL LOW (ref 2.5–4.6)

## 2022-09-08 LAB — CBG MONITORING, ED
Glucose-Capillary: 87 mg/dL (ref 70–99)
Glucose-Capillary: 85 mg/dL (ref 70–99)
Glucose-Capillary: 86 mg/dL (ref 70–99)

## 2022-09-08 LAB — MAGNESIUM: Magnesium: 1.8 mg/dL (ref 1.7–2.4)

## 2022-09-08 LAB — LACTATE DEHYDROGENASE: LDH: 216 U/L — ABNORMAL HIGH (ref 98–192)

## 2022-09-08 MED ORDER — SORBITOL 70 % SOLN: 30.0000 mL | Freq: Every day | Status: DC | PRN

## 2022-09-08 MED ORDER — SODIUM BICARBONATE 8.4 % IV SOLN
Freq: Once | INTRAVENOUS | Status: AC
  Filled 2022-09-08: qty 150

## 2022-09-08 MED ORDER — SODIUM CHLORIDE 0.9 % IV BOLUS
1000.0000 mL | Freq: Once | INTRAVENOUS | Status: AC
  Administered 2022-09-08: 1000 mL via INTRAVENOUS

## 2022-09-08 MED ORDER — ALBUTEROL SULFATE (2.5 MG/3ML) 0.083% IN NEBU
15.0000 mg | INHALATION_SOLUTION | Freq: Once | RESPIRATORY_TRACT | Status: AC
  Administered 2022-09-08: 15 mg via RESPIRATORY_TRACT
  Filled 2022-09-08: qty 18

## 2022-09-08 MED ORDER — FUROSEMIDE 10 MG/ML IJ SOLN
40.0000 mg | Freq: Once | INTRAMUSCULAR | Status: AC
  Administered 2022-09-08: 40 mg via INTRAVENOUS
  Filled 2022-09-08: qty 4

## 2022-09-08 MED ORDER — SODIUM CHLORIDE 0.9 % IV SOLN: INTRAVENOUS | Status: DC

## 2022-09-08 MED ORDER — INSULIN ASPART 100 UNIT/ML IV SOLN
10.0000 [IU] | Freq: Once | INTRAVENOUS | Status: AC
  Administered 2022-09-08: 10 [IU] via INTRAVENOUS
  Filled 2022-09-08: qty 0.1

## 2022-09-08 MED ORDER — CALCIUM GLUCONATE-NACL 1-0.675 GM/50ML-% IV SOLN
1.0000 g | Freq: Once | INTRAVENOUS | Status: AC
  Administered 2022-09-08: 1000 mg via INTRAVENOUS
  Filled 2022-09-08: qty 50

## 2022-09-08 MED ORDER — SODIUM BICARBONATE 8.4 % IV SOLN
50.0000 meq | Freq: Once | INTRAVENOUS | Status: AC
  Administered 2022-09-08: 50 meq via INTRAVENOUS
  Filled 2022-09-08: qty 50

## 2022-09-08 MED ORDER — SODIUM ZIRCONIUM CYCLOSILICATE 10 G PO PACK
10.0000 g | PACK | ORAL | Status: AC
  Administered 2022-09-08: 10 g via ORAL
  Filled 2022-09-08: qty 1

## 2022-09-08 MED ORDER — DEXTROSE 50 % IV SOLN
1.0000 | Freq: Once | INTRAVENOUS | Status: AC
  Administered 2022-09-08: 50 mL via INTRAVENOUS
  Filled 2022-09-08: qty 50

## 2022-09-08 MED ORDER — DEXTROSE 10 % IV SOLN: Freq: Once | INTRAVENOUS | Status: AC

## 2022-09-08 MED ORDER — SODIUM ZIRCONIUM CYCLOSILICATE 10 G PO PACK
10.0000 g | PACK | Freq: Two times a day (BID) | ORAL | Status: DC
  Filled 2022-09-08: qty 1

## 2022-09-08 NOTE — H&P (Addendum)
History and Physical    Patient: Frank Hernandez ZOX:[id number] DOB: [birth year] DOA: 09/08/2022 DOS: the patient was seen and examined on 09/08/2022 PCP: [name] (Inactive)  Patient coming from: SNF  Chief Complaint:  Chief Complaint  Patient presents with   Altered Mental Status   HPI: Frank Hernandez is a 82 y.o. male with medical history significant for history of PE on Eliquis, metastatic bladder cancer, multiple myeloma, and a baseline creatinine of 0.9 is sent in from the nursing home today because of decreased appetite, confusion, and failure to thrive over the last 2 to 3 days.  All of my history comes from the emergency providers records as the patient is not able to give any history.  At baseline the patient is alert and oriented x 3 In the emergency department the patient is confused.  Workup reveals a potassium of 7.5.  The the patient received acute treatment for hyperkalemia in the emergency department.  The ICU doctor was called initially.  He recommended acute treatment for the hyperkalemia but did not think ICU care was needed at this point. After the patient's acute hyperkalemia treatment has completed he will be admitted to a stepdown bed at [facility].    Review of Systems: unable to review all systems due to the inability of the patient to answer questions. 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   Pre-diabetes    Past Surgical History:  Procedure Laterality Date   BIOPSY  08/09/2022   Procedure: BIOPSY;  Surgeon: Shellia Cleverly, DO;  Location: [facility];  Service: Gastroenterology;;   COLONOSCOPY     FLEXIBLE  SIGMOIDOSCOPY N/A 08/09/2022   Procedure: FLEXIBLE SIGMOIDOSCOPY;  Surgeon: Shellia Cleverly, DO;  Location: [facility];  Service: Gastroenterology;  Laterality: N/A;  propofol   INTRAMEDULLARY (IM) NAIL INTERTROCHANTERIC Left 06/11/2020   Procedure: INTRAMEDULLARY (IM) NAIL INTERTROCHANTRIC;  Surgeon: Roby Lofts, MD;  Location: [facility] OR;  Service: Orthopedics;  Laterality: Left;   INTRAMEDULLARY (IM) NAIL INTERTROCHANTERIC Right 04/17/2022   Procedure: INTRAMEDULLARY (IM) NAIL INTERTROCHANTERIC;  Surgeon: London Sheer, MD;  Location: [facility] ORS;  Service: Orthopedics;  Laterality: Right;   KNEE ARTHROSCOPY Bilateral    POLYPECTOMY     Social History:  reports that he has never smoked. He has never used smokeless tobacco. He reports that he does not drink alcohol and does not use drugs.  No Known Allergies  Family History  Problem Relation Age of Onset   Colon cancer Neg Hx     Prior to Admission medications   Medication Sig Start Date End Date Taking? Authorizing Provider  acetaminophen (TYLENOL) 500 MG tablet Take 1,000 mg by mouth daily as needed for moderate pain.    [provider]  acyclovir (ZOVIRAX) 400 MG tablet Take 1 tablet (400 mg total) by mouth daily. 06/25/22   Johney Maine, MD  apixaban (ELIQUIS) 5 MG TABS tablet Take 1 tablet (5 mg total) by mouth 2 (two) times daily. 08/16/22   Azucena Fallen, MD  feeding supplement (ENSURE ENLIVE / ENSURE PLUS) LIQD Take 237 mLs by mouth 2 (two) times daily between meals. Patient taking differently: Take  237 mLs by mouth daily as needed (nurition). 04/03/22   Almon Hercules, MD  ferrous sulfate 325 (65 FE) MG tablet Take 1 tablet (325 mg total) by mouth 2 (two) times daily with a meal. Patient taking differently: Take 325 mg by mouth 3 (three) times a week. 06/01/22   Rodolph Bong, MD  loperamide (IMODIUM) 2 MG capsule Take 1 capsule (2 mg total) by mouth as needed for diarrhea or loose stools. Patient taking  differently: Take 2 mg by mouth in the morning and at bedtime. 06/01/22   Rodolph Bong, MD  Multiple Vitamin (MULTIVITAMIN WITH MINERALS) TABS tablet Take 1 tablet by mouth daily. 08/16/22   Azucena Fallen, MD  pantoprazole (PROTONIX) 40 MG tablet Take 1 tablet (40 mg total) by mouth 2 (two) times daily. 08/16/22   Azucena Fallen, MD  potassium chloride SA (KLOR-CON M) 20 MEQ tablet Take 2 tablets (40 mEq total) by mouth 2 (two) times daily. Patient taking differently: Take 20 mEq by mouth 3 (three) times daily. 07/09/22   Johney Maine, MD  Vitamin D, Ergocalciferol, (DRISDOL) 1.25 MG (50000 UNIT) CAPS capsule Take 1 capsule (50,000 Units total) by mouth once a week. 07/09/22   Johney Maine, MD    Physical Exam: Vitals:   09/08/22 1544 09/08/22 1830 09/08/22 1900 09/08/22 1931  BP: 134/76 123/68 114/78   Pulse: (!) 105 (!) 116 (!) 121   Resp: 18 (!) 29 (!) 27   Temp: 98 F (36.7 C)   98 F (36.7 C)  TempSrc: Oral   Oral  SpO2: 100% 100% 100%    Physical Exam:  General: Chronically ill-appearing HEENT: Normocephalic, atraumatic, PER surgically irregular Cardiovascular: Normal rhythm, tachycardic. distal pulses not palpable Pulmonary: Normal pulmonary effort, normal breath sounds Gastrointestinal: Nondistended abdomen, soft, non-tender, normoactive bowel sounds Musculoskeletal: Both feet very tender to touch.  Chronic vascular insufficiency changes with muscle wasting and hyperpigmentation in the lower shins both feet are tender to light touch.  The right foot is in a boot and there is a gauze over the right heel. Skin: Skin is warm and dry.  Hyperpigmentation in the lower legs Neuro: The patient is disoriented.  Does not follow commands consistently.  He is able to move the left lower extremity freely but does not move the right lower extremity.   PSYCH: Disoriented  Data Reviewed:  Results for orders placed or performed during the hospital encounter of  09/08/22 (from the past 24 hour(s))  Comprehensive metabolic panel     Status: Abnormal   Collection Time: 09/08/22 12:52 PM  Result Value Ref Range   Sodium 136 135 - 145 mmol/L   Potassium >7.5 (HH) 3.5 - 5.1 mmol/L   Chloride 120 (H) 98 - 111 mmol/L   CO2 8 (L) 22 - 32 mmol/L   Glucose, Bld 94 70 - 99 mg/dL   BUN 25 (H) 8 - 23 mg/dL   Creatinine, Ser 4.09 (H) 0.61 - 1.24 mg/dL   Calcium 9.2 8.9 - 81.1 mg/dL   Total Protein 6.9 6.5 - 8.1 g/dL   Albumin 3.8 3.5 - 5.0 g/dL   AST 35 15 - 41 U/L   ALT 20 0 - 44 U/L   Alkaline Phosphatase 145 (H) 38 - 126 U/L   Total Bilirubin 0.5 0.3 - 1.2 mg/dL   GFR, Estimated 54 (L) >60 mL/min   Anion gap 8 5 - 15  Ethanol     Status: None  Collection Time: 09/08/22 12:52 PM  Result Value Ref Range   Alcohol, Ethyl (B) <10 <10 mg/dL  CBC WITH DIFFERENTIAL     Status: Abnormal   Collection Time: 09/08/22 12:52 PM  Result Value Ref Range   WBC 9.6 4.0 - 10.5 K/uL   RBC 3.50 (L) 4.22 - 5.81 MIL/uL   Hemoglobin 10.5 (L) 13.0 - 17.0 g/dL   HCT 16.1 (L) 09.6 - 04.5 %   MCV 96.3 80.0 - 100.0 fL   MCH 30.0 26.0 - 34.0 pg   MCHC 31.2 30.0 - 36.0 g/dL   RDW 40.9 (H) 81.1 - 91.4 %   Platelets 388 150 - 400 K/uL   nRBC 0.0 0.0 - 0.2 %   Neutrophils Relative % 79 %   Neutro Abs 7.6 1.7 - 7.7 K/uL   Lymphocytes Relative 8 %   Lymphs Abs 0.8 0.7 - 4.0 K/uL   Monocytes Relative 11 %   Monocytes Absolute 1.0 0.1 - 1.0 K/uL   Eosinophils Relative 1 %   Eosinophils Absolute 0.1 0.0 - 0.5 K/uL   Basophils Relative 1 %   Basophils Absolute 0.1 0.0 - 0.1 K/uL   Immature Granulocytes 0 %   Abs Immature Granulocytes 0.04 0.00 - 0.07 K/uL  Magnesium     Status: None   Collection Time: 09/08/22 12:52 PM  Result Value Ref Range   Magnesium 1.8 1.7 - 2.4 mg/dL  CBG monitoring, ED     Status: None   Collection Time: 09/08/22  2:14 PM  Result Value Ref Range   Glucose-Capillary 87 70 - 99 mg/dL  Glucose, random     Status: None   Collection Time:  09/08/22  4:16 PM  Result Value Ref Range   Glucose, Bld 71 70 - 99 mg/dL  Lactate dehydrogenase     Status: Abnormal   Collection Time: 09/08/22  4:16 PM  Result Value Ref Range   LDH 216 (H) 98 - 192 U/L  Phosphorus     Status: Abnormal   Collection Time: 09/08/22  4:16 PM  Result Value Ref Range   Phosphorus 2.2 (L) 2.5 - 4.6 mg/dL  Urine rapid drug screen (hosp performed)     Status: None   Collection Time: 09/08/22  4:56 PM  Result Value Ref Range   Opiates NONE DETECTED NONE DETECTED   Cocaine NONE DETECTED NONE DETECTED   Benzodiazepines NONE DETECTED NONE DETECTED   Amphetamines NONE DETECTED NONE DETECTED   Tetrahydrocannabinol NONE DETECTED NONE DETECTED   Barbiturates NONE DETECTED NONE DETECTED  Urinalysis, Routine w reflex microscopic -Urine, Clean Catch     Status: Abnormal   Collection Time: 09/08/22  4:56 PM  Result Value Ref Range   Color, Urine YELLOW YELLOW   APPearance HAZY (A) CLEAR   Specific Gravity, Urine 1.013 1.005 - 1.030   pH 5.0 5.0 - 8.0   Glucose, UA NEGATIVE NEGATIVE mg/dL   Hgb urine dipstick SMALL (A) NEGATIVE   Bilirubin Urine NEGATIVE NEGATIVE   Ketones, ur 5 (A) NEGATIVE mg/dL   Protein, ur NEGATIVE NEGATIVE mg/dL   Nitrite NEGATIVE NEGATIVE   Leukocytes,Ua NEGATIVE NEGATIVE   RBC / HPF 11-20 0 - 5 RBC/hpf   WBC, UA 0-5 0 - 5 WBC/hpf   Bacteria, UA FEW (A) NONE SEEN   Squamous Epithelial / HPF 0-5 0 - 5 /HPF   Mucus PRESENT    Granular Casts, UA PRESENT   CBG monitoring, ED     Status: None  Collection Time: 09/08/22  7:31 PM  Result Value Ref Range   Glucose-Capillary 86 70 - 99 mg/dL  CBG monitoring, ED     Status: None   Collection Time: 09/08/22  7:34 PM  Result Value Ref Range   Glucose-Capillary 85 70 - 99 mg/dL     Assessment and Plan: Severe hyperkalemia, mild AKI -the patient received acute care for his hyperkalemia in the emergency department, including insulin, D50, bicarb, albuterol, Lasix, iv fluids and Lokelma  (All  in process at the moment).  The cause of his hyperkalemia is not known at the moment.  He does not appear to be in significant renal failure.  He does have a history of known multiple myeloma and is on potassium supplementation.  - admitted and follow potassium.  He will need urgent nephrology consultation if the potassium does not improve.  2.  Acute encephalopathy- monitor  3. Multiple myeloma - not currently on any therapy. 4.  Right heel ulcer - this will need to be fully evaluated 5.  Bilateral foot pain and vascular insufficiency - evaluation needed    Advance Care Planning:   Code Status: Full Code the patient is unable to have a CODE STATUS discussion because of his disorientation so he will be full code for now  Consults: nephrology  Family Communication: none  Severity of Illness: The appropriate patient status for this patient is INPATIENT. Inpatient status is judged to be reasonable and necessary in order to provide the required intensity of service to ensure the patient's safety. The patient's presenting symptoms, physical exam findings, and initial radiographic and laboratory data in the context of their chronic comorbidities is felt to place them at high risk for further clinical deterioration. Furthermore, it is not anticipated that the patient will be medically stable for discharge from the hospital within 2 midnights of admission.   * I certify that at the point of admission it is my clinical judgment that the patient will require inpatient hospital care spanning beyond 2 midnights from the point of admission due to high intensity of service, high risk for further deterioration and high frequency of surveillance required.*  Author: Buena Irish, MD 09/08/2022 7:39 PM  For on call review [email or web address].

## 2022-09-08 NOTE — ED Notes (Signed)
Pt had large bowel movement as well as a large urine void. Brief changed and new linen given.

## 2022-09-08 NOTE — ED Triage Notes (Addendum)
Pt BIBA from [facility] with c/o AMS x2 days, less verbal, decreased appetite, and slow to respond per the wife. FTT. Hx of cancer that is currently not being treated. Pt takes Eliquis.   BP 118/70  HR 104  CBG 97

## 2022-09-08 NOTE — ED Notes (Signed)
Patient had bladder scan patient only had 82 ml in bladder

## 2022-09-08 NOTE — ED Provider Notes (Signed)
[place] EMERGENCY DEPARTMENT AT [facility] Provider Note   CSN: [id number] Arrival date & time: 09/08/22  1206     History  Chief Complaint  Patient presents with   Altered Mental Status    Frank Hernandez is a 82 y.o. male.  82 year old male with a history of PE on Eliquis, metastatic bladder cancer, multiple myeloma, and CKD who presents to the emergency department with altered mental status for 2 days.  History obtained per EMS as patient is not able to provide history himself.  States that [facility] and has been having failure to thrive recently has been slow to respond with decreased appetite over the past 2 days.  Does have a history of cancer that is not being treated.  Patient denies any pain to me.  Attempted to call [facility] but was unable to reach staff. Per wife Ms Nardelli, recently has been in and out of the hospital this year. Has multiple myeloma and cancer. Decreased PO intake recently. Seems to be dehydrated. Is disoriented. Not eating and has been very weak. R knee has been hurting him. Not ambulatory. Last 2 days thought he was in [place] instead of [place]. Having a tough time carrying on a conversation. Typically is AAOx3. Unsure if his multiple myeloma is in remission or not but is not currently being treated.         Home Medications Prior to Admission medications   Medication Sig Start Date End Date Taking? Authorizing Provider  acetaminophen (TYLENOL) 500 MG tablet Take 1,000 mg by mouth daily as needed for moderate pain.    [provider]  acyclovir (ZOVIRAX) 400 MG tablet Take 1 tablet (400 mg total) by mouth daily. 06/25/22   Johney Maine, MD  apixaban (ELIQUIS) 5 MG TABS tablet Take 1 tablet (5 mg total) by mouth 2 (two) times daily. 08/16/22   Azucena Fallen, MD  feeding supplement (ENSURE ENLIVE / ENSURE PLUS) LIQD Take 237 mLs by mouth 2 (two) times daily between  meals. Patient taking differently: Take 237 mLs by mouth daily as needed (nurition). 04/03/22   Almon Hercules, MD  ferrous sulfate 325 (65 FE) MG tablet Take 1 tablet (325 mg total) by mouth 2 (two) times daily with a meal. Patient taking differently: Take 325 mg by mouth 3 (three) times a week. 06/01/22   Rodolph Bong, MD  loperamide (IMODIUM) 2 MG capsule Take 1 capsule (2 mg total) by mouth as needed for diarrhea or loose stools. Patient taking differently: Take 2 mg by mouth in the morning and at bedtime. 06/01/22   Rodolph Bong, MD  Multiple Vitamin (MULTIVITAMIN WITH MINERALS) TABS tablet Take 1 tablet by mouth daily. 08/16/22   Azucena Fallen, MD  pantoprazole (PROTONIX) 40 MG tablet Take 1 tablet (40 mg total) by mouth 2 (two) times daily. 08/16/22   Azucena Fallen, MD  potassium chloride SA (KLOR-CON M) 20 MEQ tablet Take 2 tablets (40 mEq total) by mouth 2 (two) times daily. Patient taking differently: Take 20 mEq by mouth 3 (three) times daily. 07/09/22   Johney Maine, MD  Vitamin D, Ergocalciferol, (DRISDOL) 1.25 MG (50000 UNIT) CAPS capsule Take 1 capsule (50,000 Units total) by mouth once a week. 07/09/22   Johney Maine, MD      Allergies    Patient has no known allergies.    Review of Systems   Review of Systems  Physical Exam  Updated Vital Signs BP 123/68   Pulse (!) 116   Temp 98 F (36.7 C) (Oral)   Resp (!) 29   SpO2 100%  Physical Exam Vitals and nursing note reviewed.  Constitutional:      General: He is not in acute distress.    Appearance: He is well-developed.     Comments: Alert and oriented to year and self.  Unsure of his location or the exact date.  Conversant and responsive otherwise  HENT:     Head: Normocephalic and atraumatic.     Right Ear: External ear normal.     Left Ear: External ear normal.     Nose: Nose normal.  Eyes:     Extraocular Movements: Extraocular movements intact.     Conjunctiva/sclera:  Conjunctivae normal.     Pupils: Pupils are equal, round, and reactive to light.  Cardiovascular:     Rate and Rhythm: Normal rate and regular rhythm.     Heart sounds: Normal heart sounds.  Pulmonary:     Effort: Pulmonary effort is normal. No respiratory distress.     Breath sounds: Normal breath sounds.  Abdominal:     General: There is no distension.     Palpations: Abdomen is soft. There is no mass.     Tenderness: There is no abdominal tenderness. There is no guarding.  Genitourinary:    Comments: No sacral wounds noted.  Musculoskeletal:     Cervical back: Normal range of motion and neck supple.     Right lower leg: No edema.     Left lower leg: No edema.     Comments: Right heel with bandaging concerning for pressure ulcer.  Right knee with limited range of motion but still is able to flex to 90 degrees and extend to approximately 30 degrees.  No warmth.  Does appear to have effusion.  Skin:    General: Skin is warm and dry.  Neurological:     Mental Status: He is alert. Mental status is at baseline.     Cranial Nerves: No cranial nerve deficit.     Comments: Cranial nerves II through XII intact.  Full strength of bilateral upper extremities.  Weakness of right lower extremity which is at baseline per the patient.  4/5 strength in left lower extremity.  Intact sensation to light touch in all extremities.  Intact finger-nose.  Having difficulty with heel-to-shin in the lower extremities due to weakness.  Psychiatric:        Mood and Affect: Mood normal.        Behavior: Behavior normal.     ED Results / Procedures / Treatments   Labs (all labs ordered are listed, but only abnormal results are displayed) Labs Reviewed  COMPREHENSIVE METABOLIC PANEL - Abnormal; Notable for the following components:      Result Value   Potassium >7.5 (*)    Chloride 120 (*)    CO2 8 (*)    BUN 25 (*)    Creatinine, Ser 1.32 (*)    Alkaline Phosphatase 145 (*)    GFR, Estimated 54 (*)     All other components within normal limits  CBC WITH DIFFERENTIAL/PLATELET - Abnormal; Notable for the following components:   RBC 3.50 (*)    Hemoglobin 10.5 (*)    HCT 33.7 (*)    RDW 18.3 (*)    All other components within normal limits  URINALYSIS, ROUTINE W REFLEX MICROSCOPIC - Abnormal; Notable for the following components:   APPearance  HAZY (*)    Hgb urine dipstick SMALL (*)    Ketones, ur 5 (*)    Bacteria, UA FEW (*)    All other components within normal limits  RAPID URINE DRUG SCREEN, HOSP PERFORMED  ETHANOL  MAGNESIUM  POTASSIUM  GLUCOSE, RANDOM  LACTATE DEHYDROGENASE  PHOSPHORUS  CBG MONITORING, ED    EKG EKG Interpretation Date/Time:  Wednesday September 08 2022 14:08:47 EDT Ventricular Rate:  106 PR Interval:  132 QRS Duration:  80 QT Interval:  324 QTC Calculation: 431 R Axis:   -31  Text Interpretation: Sinus tachycardia Left axis deviation Low voltage, precordial leads Confirmed by Vonita Moss [phone]) on 09/08/2022 2:56:31 PM  Radiology DG Hip Unilat W or Wo Pelvis 2-3 Views Right  Result Date: 09/08/2022 CLINICAL DATA:  Bilateral hip pain. EXAM: DG HIP (WITH OR WITHOUT PELVIS) 2-3V RIGHT; DG HIP (WITH OR WITHOUT PELVIS) 2-3V LEFT COMPARISON:  None Available. FINDINGS: Evaluation is limited due to positioning and soft tissue attenuation. There is no acute fracture or dislocation. Status post prior ORIF of bilateral femoral neck fractures. The visualized hardware are intact. The bones are osteopenic. Degenerative changes of the lumbar spine. The soft tissues are unremarkable. IMPRESSION: 1. No acute fracture or dislocation. 2. Status post prior ORIF of bilateral femoral neck fractures. Electronically Signed   By: Elgie Collard M.D.   On: 09/08/2022 17:44   DG Hip Unilat W or Wo Pelvis 2-3 Views Left  Result Date: 09/08/2022 CLINICAL DATA:  Bilateral hip pain. EXAM: DG HIP (WITH OR WITHOUT PELVIS) 2-3V RIGHT; DG HIP (WITH OR WITHOUT PELVIS) 2-3V LEFT  COMPARISON:  None Available. FINDINGS: Evaluation is limited due to positioning and soft tissue attenuation. There is no acute fracture or dislocation. Status post prior ORIF of bilateral femoral neck fractures. The visualized hardware are intact. The bones are osteopenic. Degenerative changes of the lumbar spine. The soft tissues are unremarkable. IMPRESSION: 1. No acute fracture or dislocation. 2. Status post prior ORIF of bilateral femoral neck fractures. Electronically Signed   By: Elgie Collard M.D.   On: 09/08/2022 17:44   DG Knee Right Port  Result Date: 09/08/2022 CLINICAL DATA:  Failure to thrive Altered mental status Decreased appetite EXAM: PORTABLE RIGHT KNEE - 1-2 VIEW COMPARISON:  07/30/2022 FINDINGS: No fracture or dislocation. Soft tissues are unremarkable. Visualized portions of intramedullary rod and screws are intact without periprosthetic fracture or lucency. Advanced degenerative changes of the right knee are again seen. Lateral view is limited due to obliquity. IMPRESSION: No acute abnormality of the right knee. Electronically Signed   By: Acquanetta Belling M.D.   On: 09/08/2022 13:50   CT HEAD WO CONTRAST  Result Date: 09/08/2022 CLINICAL DATA:  Mental status change, unknown cause EXAM: CT HEAD WITHOUT CONTRAST TECHNIQUE: Contiguous axial images were obtained from the base of the skull through the vertex without intravenous contrast. RADIATION DOSE REDUCTION: This exam was performed according to the departmental dose-optimization program which includes automated exposure control, adjustment of the mA and/or kV according to patient size and/or use of iterative reconstruction technique. COMPARISON:  CT Head 07/01/22 FINDINGS: Brain: No evidence of acute infarction, hemorrhage, hydrocephalus, extra-axial collection or mass lesion/mass effect. Mineralization of the basal ganglia bilaterally. Enlarged and partially empty sella. Vascular: No hyperdense vessel or unexpected calcification. Skull:  Normal. Negative for fracture or focal lesion. Sinuses/Orbits: No middle ear or mastoid effusion. Paranasal sinuses are notable for polypoid mucosal thickening in the left maxillary sinus. Orbits are unremarkable Other: None.  IMPRESSION: 1. No acute intracranial abnormality. 2. Enlarged and partially empty sella, which is nonspecific but can be seen in the setting of idiopathic intracranial hypertension. Electronically Signed   By: Lorenza Cambridge M.D.   On: 09/08/2022 13:26    Procedures Procedures    Medications Ordered in ED Medications  dextrose 10 % infusion (has no administration in time range)  sodium bicarbonate 150 mEq in dextrose 5 % 1,150 mL infusion ( Intravenous New Bag/Given 09/08/22 1828)  sodium chloride 0.9 % bolus 1,000 mL (has no administration in time range)  sodium zirconium cyclosilicate (LOKELMA) packet 10 g (has no administration in time range)  sodium bicarbonate injection 50 mEq (has no administration in time range)  sodium chloride 0.9 % bolus 1,000 mL (0 mLs Intravenous Stopped 09/08/22 1759)  calcium gluconate 1 g/ 50 mL sodium chloride IVPB (0 mg Intravenous Stopped 09/08/22 1733)  furosemide (LASIX) injection 40 mg (40 mg Intravenous Given 09/08/22 1757)  albuterol (PROVENTIL) (2.5 MG/3ML) 0.083% nebulizer solution 15 mg (15 mg Nebulization Given 09/08/22 1753)  insulin aspart (novoLOG) injection 10 Units (10 Units Intravenous Given 09/08/22 1821)    And  dextrose 50 % solution 50 mL (50 mLs Intravenous Given 09/08/22 1823)    ED Course/ Medical Decision Making/ A&P Clinical Course as of 09/08/22 1850  Wed Sep 08, 2022  1741 Patient had 150 mL of urine in his bladder [RP]  1757 Dr Vassie Loll from critical care consulted and recommends starting the patient on a bicarb drip with a push of bicarb and admitting to stepdown.  Feels the patient may correct with medical interventions and may not require dialysis. [RP]  1815 Dr Gasper Sells will admit to step down unit. [RP]     Clinical Course User Index [RP] Rondel Baton, MD                             Medical Decision Making Amount and/or Complexity of Data Reviewed Labs: ordered. Radiology: ordered.  Risk OTC drugs. Prescription drug management. Decision regarding hospitalization.   SAUL FABIANO is a 82 y.o. male with comorbidities that complicate the patient evaluation including PE on Eliquis, metastatic bladder cancer, multiple myeloma, and CKD who presents to the emergency department with altered mental status for 2 days.    Initial Ddx:  ICH, stroke, electrolyte abnormality, UTI  MDM/Course:  Unclear exactly what is causing the patient's symptoms at this time.  Does not appear to have any focal neurologic deficits I suspect that his altered mental status is likely due to a toxic metabolic cause.  Did send off a urinalysis which does not appear to be consistent with UTI.  Did have a chemistry that was sent that shows that he has mild AKI and undetectably high potassium.  EKG did show peaked T waves and loss of P waves with normal QRS.  Patient was given calcium and IV fluids.  Repeat potassium was sent which the lab called and was also severely elevated.  Patient was discussed with the ICU who recommended medical management but did not feel that the patient needed dialysis at this time because his EKG is reassuring.  Upon re-evaluation patient remained stable.  Was ordered for insulin, D50, bicarb push and drip, Lasix, and IV fluids.  Discussed with hospitalist will admit to stepdown.  Of note, patient was complaining of hip pain so did obtain x-rays of his hip and knee which did not show any acute  abnormalities.  May have very small effusion of the knee but is still able to range it and does not have any warmth so feel that septic joint is highly unlikely.  May be having some muscle cramping as result of his hyperkalemia is causing his symptoms.  This patient presents to the ED for concern of  complaints listed in HPI, this involves an extensive number of treatment options, and is a complaint that carries with it a high risk of complications and morbidity. Disposition including potential need for admission considered.   Dispo: Admit to Step Down  Additional history obtained from spouse Records reviewed Outpatient Clinic Notes The following labs were independently interpreted: Chemistry and show AKI I independently reviewed the following imaging with scope of interpretation limited to determining acute life threatening conditions related to emergency care: Pelvis x-ray and agree with the radiologist interpretation with the following exceptions: none I personally reviewed and interpreted cardiac monitoring: normal sinus rhythm  I personally reviewed and interpreted the pt's EKG: see above for interpretation  I have reviewed the patients home medications and made adjustments as needed Consults: Critical care and Hospitalist Social Determinants of health:  Elderly   CRITICAL CARE Performed by: Rondel Baton   Total critical care time: 45 minutes  Critical care time was exclusive of separately billable procedures and treating other patients.  Critical care was necessary to treat or prevent imminent or life-threatening deterioration.  Critical care was time spent personally by me on the following activities: development of treatment plan with patient and/or surrogate as well as nursing, discussions with consultants, evaluation of patient's response to treatment, examination of patient, obtaining history from patient or surrogate, ordering and performing treatments and interventions, ordering and review of laboratory studies, ordering and review of radiographic studies, pulse oximetry and re-evaluation of patient's condition.        Final Clinical Impression(s) / ED Diagnoses Final diagnoses:  Disorientation  AKI (acute kidney injury) (HCC)  Hyperkalemia    Rx / DC  Orders ED Discharge Orders     None         Rondel Baton, MD 09/08/22 1850

## 2022-09-08 NOTE — Consult Note (Addendum)
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  These metabolic derangements were likely secondary to poor oral intake and persistent diarrhea.  Patient discharged on oral potassium chloride 40 mill equivalents twice daily.  He presented earlier today to the ED from his nursing facility with concerns for confusion.  At no point has he been able to provide history including at the time of my assessment.  [place].  By report he was having decreased activity, anorexia, confusion.  Labs here are notable for a potassium of >7.5 confirmed on repeat, serum bicarbonate of 8 with anion gap of 8, creatinine 1.3 with a baseline around 1.0.  Phosphorus is 2.2, calcium 9.2, magnesium 1.8.  Hemoglobin is 10.5 or 7.2 before.  WBC and platelet counts do appear to be fairly stable.  He is unable to voice any specific complaints.  No edema.  In the ED he has received 2 L of normal saline, calcium gluconate, insulin/dextrose, 1 amp of sodium bicarbonate and placed on a drip, 10 g of Lokelma.  EKG without clear peaked T waves or prolongation of QRS or PR intervals.  History of PE on apixaban (06/2022), SNF resident, recent workup for diarrhea  Is unclear if he has been having diarrhea.  Is in a diaper.  PMH Incudes: Myeloma followed by Dr. 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   Pre-diabetes    PSH  Past Surgical History:  Procedure Laterality Date   BIOPSY  08/09/2022   Procedure: BIOPSY;  Surgeon: Shellia Cleverly, DO;  Location: [facility];  Service: Gastroenterology;;   COLONOSCOPY     FLEXIBLE SIGMOIDOSCOPY N/A 08/09/2022   Procedure: FLEXIBLE SIGMOIDOSCOPY;  Surgeon: Shellia Cleverly, DO;  Location: [facility];  Service: Gastroenterology;  Laterality: N/A;  propofol   INTRAMEDULLARY (IM) NAIL INTERTROCHANTERIC Left 06/11/2020   Procedure: INTRAMEDULLARY (IM) NAIL INTERTROCHANTRIC;  Surgeon: Roby Lofts, MD;  Location: [facility] OR;  Service: Orthopedics;  Laterality: Left;   INTRAMEDULLARY (IM) NAIL INTERTROCHANTERIC Right 04/17/2022   Procedure: INTRAMEDULLARY (IM) NAIL INTERTROCHANTERIC;  Surgeon: London Sheer, MD;  Location: [facility] ORS;  Service: Orthopedics;  Laterality: Right;   KNEE ARTHROSCOPY Bilateral    POLYPECTOMY     FH  Family History  Problem Relation Age of Onset   Colon cancer Neg Hx    SH  reports that he has never smoked. He has never used smokeless tobacco. He reports that he does not drink alcohol and does not use drugs. Allergies No Known Allergies Home medications Prior to Admission medications   Medication Sig Start Date End Date  Taking? Authorizing Provider  acetaminophen (TYLENOL) 500 MG tablet Take 1,000 mg by mouth every 8 (eight) hours as needed for moderate pain.   Yes [provider]  acyclovir (ZOVIRAX) 400 MG tablet Take 1 tablet (400 mg total) by mouth daily. 06/25/22  Yes Johney Maine, MD  Amino Acids-Protein Hydrolys (FEEDING SUPPLEMENT,  PRO-STAT SUGAR FREE 64,) LIQD Take 30 mLs by mouth 2 (two) times daily.   Yes [provider]  ammonium lactate (LAC-HYDRIN) 12 % lotion Apply 1 Application topically See admin instructions. Apply to both feet and legs at bedtime   Yes [provider]  apixaban (ELIQUIS) 5 MG TABS tablet Take 1 tablet (5 mg total) by mouth 2 (two) times daily. 08/16/22  Yes Azucena Fallen, MD  ferrous sulfate 325 (65 FE) MG tablet Take 1 tablet (325 mg total) by mouth 2 (two) times daily with a meal. 06/01/22  Yes Rodolph Bong, MD  ipratropium-albuterol (DUONEB) 0.5-2.5 (3) MG/3ML SOLN Take 3 mLs by nebulization every 6 (six) hours as needed (for shortness of breath, wheezing, or congestion). 08/25/22 09/08/22 Yes [provider]  loperamide (IMODIUM) 2 MG capsule Take 1 capsule (2 mg total) by mouth as needed for diarrhea or loose stools. Patient taking differently: Take 2 mg by mouth every 4 (four) hours as needed for diarrhea or loose stools. 06/01/22  Yes Rodolph Bong, MD  Multiple Vitamin (MULTIVITAMIN WITH MINERALS) TABS tablet Take 1 tablet by mouth daily. 08/16/22  Yes Azucena Fallen, MD  pantoprazole (PROTONIX) 40 MG tablet Take 1 tablet (40 mg total) by mouth 2 (two) times daily. 08/16/22  Yes Azucena Fallen, MD  potassium chloride SA (KLOR-CON M) 20 MEQ tablet Take 2 tablets (40 mEq total) by mouth 2 (two) times daily. 07/09/22  Yes Johney Maine, MD  Vitamin D, Ergocalciferol, (DRISDOL) 1.25 MG (50000 UNIT) CAPS capsule Take 1 capsule (50,000 Units total) by mouth once a week. Patient taking differently: Take 50,000 Units by mouth every Friday. 07/09/22  Yes Johney Maine, MD  feeding supplement (ENSURE ENLIVE / ENSURE PLUS) LIQD Take 237 mLs by mouth 2 (two) times daily between meals. Patient not taking: Reported on 09/08/2022 04/03/22   Almon Hercules, MD    Current Medications Scheduled Meds:  sodium bicarbonate 150 mEq in dextrose 5 % 1,150 mL  infusion   Intravenous Once   Continuous Infusions: PRN Meds:.sorbitol  CBC Recent Labs  Lab 09/08/22 1252  WBC 9.6  NEUTROABS 7.6  HGB 10.5*  HCT 33.7*  MCV 96.3  PLT 388   Basic Metabolic Panel Recent Labs  Lab 09/08/22 1252 09/08/22 1616  NA 136  --   K >7.5* >7.5*  CL 120*  --   CO2 8*  --   GLUCOSE 94 71  BUN 25*  --   CREATININE 1.32*  --   CALCIUM 9.2  --   PHOS  --  2.2*    Physical Exam  Blood pressure 111/69, pulse (!) 103, temperature 98 F (36.7 C), temperature source Oral, resp. rate 16, SpO2 100 %. GEN: Chronically ill-appearing, confused, not coherent and able to provide a history ENT: Poor dentition, dry mucous membranes EYES: Eyes are sunken CV: Tachycardic, regular, no rub PULM: Clear bilaterally ABD: Soft, no suprapubic fullness, nondistended SKIN: No rashes or lesions EXT: No significant edema present NEUROLOGICAL: Awake, oriented to self, year, but not to place or date.  Assessment 17M with multiple myeloma, recent admission for diarrhea related  to colonic pseudoobstruction and consequential electrolyte abnormalities returns with confusion, severe hyperkalemia, normal anion gap metabolic acidosis.  He also has a mild AKI.  Hyperkalemia, severe, EKG without characteristic changes.  Likely driven by reduced GFR, metabolic acidosis, and current supplemental potassium dosing of 80 mEq total daily.  He has received medication management including Lokelma and calcium. NAG MA: Would likely be due to diarrhea but not fully confirmed at this is present.  Would not expect to this degree if simply from his AKI. AKI: Mild.  Clinically most consistent with hypovolemia/prerenal. Confusion/encephalopathy: Likely above numbers 1 through 3 contributing. Multiple myeloma currently not under therapy  Plan Has received appropriate management of hyperkalemia thus far, EKG has been reassuring.  Hopefully subsequent potassiums will show improvement.  Would continue  Lokelma twice daily at least.  Currently a poor candidate for dialysis and I think it will not come to that, he just needs more time.  Hold supplemetal KCl.  Continue bicarbonate infusion after initial amp given.  Will assist with treatment of hyperkalemia as well. Hydration will help with AKI.  Renal ultrasound ordered. Check ABG if not improving Daily weights, Daily Renal Panel, Strict I/Os, Avoid nephrotoxins (NSAIDs, judicious IV Contrast) Will follow along  Frank Hernandez  09/08/2022, 9:47 PM

## 2022-09-08 NOTE — ED Notes (Addendum)
ED TO INPATIENT HANDOFF REPORT  ED Nurse Name and Phone #: Radonna Ricker Name/Age/Gender Frank Hernandez 82 y.o. male Room/Bed: WA22/WA22  Code Status   Code Status: Full Code  Home/SNF/Other Nursing Home Patient oriented to: self, place, time Is this baseline? Yes   Triage Complete: Triage complete  Chief Complaint Hyperkalemia [E87.5]  Triage Note Pt BIBA from [facility] with c/o AMS x2 days, less verbal, decreased appetite, and slow to respond per the wife. FTT. Hx of cancer that is currently not being treated. Pt takes Eliquis.   BP 118/70  HR 104  CBG 97   Allergies No Known Allergies  Level of Care/Admitting Diagnosis ED Disposition     ED Disposition  Admit   Condition  --   Comment  Hospital Area: [facility] [[id number]]  Level of Care: Progressive [102]  Admit to Progressive based on following criteria: NEPHROLOGY stable condition requiring close monitoring for AKI, requiring Hemodialysis or Peritoneal Dialysis either from expected electrolyte imbalance, acidosis, or fluid overload that can be managed by NIPPV or high flow oxygen.  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   Bleeding from the nose    Cellulitis of both lower extremities 07/29/2022   Chronic kidney disease, stage 3a (HCC) 04/03/2022   Hypertension    Multiple myeloma not having achieved  remission (HCC) 04/21/2022   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   Pre-diabetes    Past Surgical History:  Procedure Laterality Date   BIOPSY  08/09/2022   Procedure: BIOPSY;  Surgeon: Shellia Cleverly, DO;  Location: [facility];  Service: Gastroenterology;;   COLONOSCOPY     FLEXIBLE SIGMOIDOSCOPY N/A 08/09/2022   Procedure: FLEXIBLE SIGMOIDOSCOPY;  Surgeon: Shellia Cleverly, DO;  Location: [facility];  Service: Gastroenterology;  Laterality: N/A;  propofol   INTRAMEDULLARY (IM) NAIL INTERTROCHANTERIC Left 06/11/2020   Procedure: INTRAMEDULLARY (IM) NAIL INTERTROCHANTRIC;  Surgeon: Roby Lofts, MD;  Location: [facility] OR;  Service: Orthopedics;  Laterality: Left;   INTRAMEDULLARY (IM) NAIL INTERTROCHANTERIC Right 04/17/2022   Procedure: INTRAMEDULLARY (IM) NAIL INTERTROCHANTERIC;  Surgeon: London Sheer, MD;  Location: [facility]S;  Service: Orthopedics;  Laterality: Right;   KNEE ARTHROSCOPY Bilateral    POLYPECTOMY       A IV Location/Drains/Wounds Patient Lines/Drains/Airways Status     Active Line/Drains/Airways     Name Placement date Placement time Site Days   Peripheral IV 09/08/22 20 G 1.75" Right Antecubital 09/08/22  1255  Antecubital  less than 1   Peripheral IV 09/08/22 22 G 1.75" Anterior;Left;Proximal Forearm 09/08/22  2032  Forearm  less than 1   Fecal Management System 45 mL 08/09/22  1400  -- 30   Pressure Injury 03/29/22 Buttocks Right Stage 1 -  Intact skin with non-blanchable redness of a localized area usually over a bony prominence. 03/29/22  2140  -- 163   Pressure Injury 05/28/22 Heel Right Stage 3 -  Full thickness tissue loss. Subcutaneous fat may be visible but bone, tendon or muscle are NOT exposed. Open wound, broken from pressure & dry skin 05/28/22  1400  -- 103   Pressure Injury 08/08/22 Buttocks Right;Left Deep Tissue Pressure Injury - Purple or maroon localized area of discolored intact skin or blood-filled blister due to damage of underlying soft tissue  from pressure and/or shear. 08/08/22  1241  -- 31   Wound / Incision (Open or Dehisced) Heel Left --  --  Heel  --   Wound / Incision (Open or Dehisced) 08/04/22 (MARSI) Medical Adhesive-Related Skin Injury Scrotum Right;Left excoriation stage 1 08/04/22  1600  Scrotum  35            Intake/Output Last 24 hours  Intake/Output Summary (Last 24 hours) at 09/08/2022 2232 Last data filed at 09/08/2022 2211 Gross per 24 hour  Intake 1000 ml  Output --  Net 1000 ml    Labs/Imaging Results for orders placed or performed during the hospital encounter of 09/08/22 (from the past 48 hour(s))  Comprehensive metabolic panel     Status: Abnormal   Collection Time: 09/08/22 12:52 PM  Result Value Ref Range   Sodium 136 135 - 145 mmol/L   Potassium >7.5 (HH) 3.5 - 5.1 mmol/L    Comment: CRITICAL RESULT CALLED TO, READ BACK BY AND VERIFIED WITH KENT,Q. RN AT 1401 09/08/22 MULLINS,T REPEATED TO VERIFY NO VISIBLE HEMOLYSIS    Chloride 120 (H) 98 - 111 mmol/L   CO2 8 (L) 22 - 32 mmol/L   Glucose, Bld 94 70 - 99 mg/dL    Comment: Glucose reference range applies only to samples taken after fasting for at least 8 hours.   BUN 25 (H) 8 - 23 mg/dL   Creatinine, Ser 1.61 (H) 0.61 - 1.24 mg/dL   Calcium 9.2 8.9 - 09.6 mg/dL   Total Protein 6.9 6.5 - 8.1 g/dL   Albumin 3.8 3.5 - 5.0 g/dL   AST 35 15 - 41 U/L   ALT 20 0 - 44 U/L   Alkaline Phosphatase 145 (H) 38 - 126 U/L   Total Bilirubin 0.5 0.3 - 1.2 mg/dL   GFR, Estimated 54 (L) >60 mL/min    Comment: (NOTE) Calculated using the CKD-EPI Creatinine Equation (2021)    Anion gap 8 5 - 15    Comment: Performed at [facility], [address] [place], [place] [address]  Ethanol     Status: None   Collection Time: 09/08/22 12:52 PM  Result Value Ref Range   Alcohol, Ethyl (B) <10 <10 mg/dL    Comment: (NOTE) Lowest detectable limit for serum alcohol is 10 mg/dL.  For medical purposes only. Performed at [facility], [address] [place], [place] [address]   CBC WITH DIFFERENTIAL     Status: Abnormal   Collection Time: 09/08/22 12:52 PM  Result Value Ref Range   WBC 9.6 4.0 - 10.5 K/uL   RBC 3.50 (L) 4.22 - 5.81 MIL/uL   Hemoglobin 10.5 (L) 13.0 - 17.0 g/dL   HCT 14.7 (L) 82.9 - 56.2 %   MCV 96.3 80.0 - 100.0 fL   MCH 30.0 26.0 - 34.0 pg   MCHC 31.2 30.0 - 36.0 g/dL   RDW 13.0 (H) 86.5 - 78.4 %   Platelets 388 150 - 400 K/uL   nRBC 0.0 0.0 - 0.2 %   Neutrophils Relative % 79 %   Neutro Abs 7.6 1.7 -  7.7 K/uL   Lymphocytes Relative 8 %   Lymphs Abs 0.8 0.7 - 4.0 K/uL   Monocytes Relative 11 %   Monocytes Absolute 1.0 0.1 - 1.0 K/uL   Eosinophils Relative 1 %   Eosinophils Absolute 0.1 0.0 - 0.5 K/uL   Basophils Relative 1 %   Basophils Absolute 0.1 0.0 - 0.1 K/uL   Immature Granulocytes 0 %   Abs Immature Granulocytes 0.04 0.00 - 0.07 K/uL    Comment: Performed at [facility], [address], [place], [place] [address]  Magnesium     Status: None   Collection Time: 09/08/22 12:52 PM  Result Value Ref Range   Magnesium 1.8 1.7 - 2.4 mg/dL    Comment: Performed at [facility], [address], [place], [place] [address]  CBG monitoring, ED     Status: None   Collection Time: 09/08/22  2:14 PM  Result Value Ref Range   Glucose-Capillary 87 70 - 99 mg/dL    Comment: Glucose reference range applies only to samples taken after fasting for at least 8 hours.  Potassium     Status: Abnormal   Collection Time: 09/08/22  4:16 PM  Result Value Ref Range   Potassium >7.5 (HH) 3.5 - 5.1 mmol/L    Comment: CRITICAL RESULT CALLED TO, READ BACK BY AND VERIFIED WITH: RN Shela [facility] AT 2000 09/08/22 CRUICKSHANK A NO VISIBLE HEMOLYSIS Performed at [facility], [address] [place], [place] [address]   Glucose, random     Status: None   Collection Time: 09/08/22  4:16 PM  Result Value Ref Range   Glucose, Bld 71 70 - 99  mg/dL    Comment: Glucose reference range applies only to samples taken after fasting for at least 8 hours. Performed at [facility], [address] [place], [place] [address]   Lactate dehydrogenase     Status: Abnormal   Collection Time: 09/08/22  4:16 PM  Result Value Ref Range   LDH 216 (H) 98 - 192 U/L    Comment: Performed at [facility], [address] [place], [place] [address]  Phosphorus     Status: Abnormal   Collection Time: 09/08/22  4:16 PM  Result Value Ref Range   Phosphorus 2.2 (L) 2.5 - 4.6 mg/dL    Comment: Performed at [facility], [address], [place], [place] [address]  Urine rapid drug screen (hosp performed)     Status: None   Collection Time: 09/08/22  4:56 PM  Result Value Ref Range   Opiates NONE DETECTED NONE DETECTED   Cocaine NONE DETECTED NONE DETECTED   Benzodiazepines NONE DETECTED NONE DETECTED   Amphetamines NONE DETECTED NONE DETECTED   Tetrahydrocannabinol NONE DETECTED NONE DETECTED   Barbiturates NONE DETECTED NONE DETECTED    Comment: (NOTE) DRUG SCREEN FOR MEDICAL PURPOSES ONLY.  IF CONFIRMATION IS NEEDED FOR ANY PURPOSE, NOTIFY LAB WITHIN 5 DAYS.  LOWEST DETECTABLE LIMITS FOR URINE DRUG SCREEN Drug Class                     Cutoff (ng/mL) Amphetamine and metabolites    1000 Barbiturate and metabolites    200 Benzodiazepine                 200 Opiates and metabolites        300 Cocaine and metabolites        300 THC  50 Performed at [facility], [address] [place], [place] [address]   Urinalysis, Routine w reflex microscopic -Urine, Clean Catch     Status: Abnormal   Collection Time: 09/08/22  4:56 PM  Result Value Ref Range   Color, Urine YELLOW YELLOW   APPearance HAZY (A) CLEAR   Specific Gravity, Urine 1.013 1.005 - 1.030   pH 5.0 5.0 - 8.0   Glucose, UA NEGATIVE NEGATIVE mg/dL   Hgb urine dipstick SMALL (A)  NEGATIVE   Bilirubin Urine NEGATIVE NEGATIVE   Ketones, ur 5 (A) NEGATIVE mg/dL   Protein, ur NEGATIVE NEGATIVE mg/dL   Nitrite NEGATIVE NEGATIVE   Leukocytes,Ua NEGATIVE NEGATIVE   RBC / HPF 11-20 0 - 5 RBC/hpf   WBC, UA 0-5 0 - 5 WBC/hpf   Bacteria, UA FEW (A) NONE SEEN   Squamous Epithelial / HPF 0-5 0 - 5 /HPF   Mucus PRESENT    Granular Casts, UA PRESENT     Comment: Performed at [facility], [address], [place], [place] [address]  CBG monitoring, ED     Status: None   Collection Time: 09/08/22  7:31 PM  Result Value Ref Range   Glucose-Capillary 86 70 - 99 mg/dL    Comment: Glucose reference range applies only to samples taken after fasting for at least 8 hours.  CBG monitoring, ED     Status: None   Collection Time: 09/08/22  7:34 PM  Result Value Ref Range   Glucose-Capillary 85 70 - 99 mg/dL    Comment: Glucose reference range applies only to samples taken after fasting for at least 8 hours.   US RENAL  Result Date: 09/08/2022 CLINICAL DATA:  Bladder obstruction. EXAM: RENAL / URINARY TRACT ULTRASOUND COMPLETE COMPARISON:  CT abdomen pelvis dated 07/29/2022. FINDINGS: Evaluation is very limited due to significant amount of overlying bowel gas as well as body habitus. Right Kidney: Renal measurements: 8.4 x 4.4 x 5.4 cm = volume: 105 mL. Normal echogenicity. No hydronephrosis or shadowing stone. There is a 2 cm upper pole cyst. Left Kidney: Renal measurements: 9.3 x 4.9 x 5.1 cm = volume: 120 mL. Normal echogenicity. No hydronephrosis or shadowing stone. A 4 cm upper pole cyst. Bladder: Appears normal for degree of bladder distention. Other: None. IMPRESSION: No hydronephrosis or shadowing stone. Electronically Signed   By: Elgie Collard M.D.   On: 09/08/2022 21:51   DG Hip Unilat W or Wo Pelvis 2-3 Views Right  Result Date: 09/08/2022 CLINICAL DATA:  Bilateral hip pain. EXAM: DG HIP (WITH OR WITHOUT PELVIS) 2-3V RIGHT; DG HIP (WITH OR WITHOUT PELVIS)  2-3V LEFT COMPARISON:  None Available. FINDINGS: Evaluation is limited due to positioning and soft tissue attenuation. There is no acute fracture or dislocation. Status post prior ORIF of bilateral femoral neck fractures. The visualized hardware are intact. The bones are osteopenic. Degenerative changes of the lumbar spine. The soft tissues are unremarkable. IMPRESSION: 1. No acute fracture or dislocation. 2. Status post prior ORIF of bilateral femoral neck fractures. Electronically Signed   By: Elgie Collard M.D.   On: 09/08/2022 17:44   DG Hip Unilat W or Wo Pelvis 2-3 Views Left  Result Date: 09/08/2022 CLINICAL DATA:  Bilateral hip pain. EXAM: DG HIP (WITH OR WITHOUT PELVIS) 2-3V RIGHT; DG HIP (WITH OR WITHOUT PELVIS) 2-3V LEFT COMPARISON:  None Available. FINDINGS: Evaluation is limited due to positioning and soft tissue attenuation. There is no acute fracture or dislocation. Status  post prior ORIF of bilateral femoral neck fractures. The visualized hardware are intact. The bones are osteopenic. Degenerative changes of the lumbar spine. The soft tissues are unremarkable. IMPRESSION: 1. No acute fracture or dislocation. 2. Status post prior ORIF of bilateral femoral neck fractures. Electronically Signed   By: Elgie Collard M.D.   On: 09/08/2022 17:44   DG Knee Right Port  Result Date: 09/08/2022 CLINICAL DATA:  Failure to thrive Altered mental status Decreased appetite EXAM: PORTABLE RIGHT KNEE - 1-2 VIEW COMPARISON:  07/30/2022 FINDINGS: No fracture or dislocation. Soft tissues are unremarkable. Visualized portions of intramedullary rod and screws are intact without periprosthetic fracture or lucency. Advanced degenerative changes of the right knee are again seen. Lateral view is limited due to obliquity. IMPRESSION: No acute abnormality of the right knee. Electronically Signed   By: Acquanetta Belling M.D.   On: 09/08/2022 13:50   CT HEAD WO CONTRAST  Result Date: 09/08/2022 CLINICAL DATA:  Mental  status change, unknown cause EXAM: CT HEAD WITHOUT CONTRAST TECHNIQUE: Contiguous axial images were obtained from the base of the skull through the vertex without intravenous contrast. RADIATION DOSE REDUCTION: This exam was performed according to the departmental dose-optimization program which includes automated exposure control, adjustment of the mA and/or kV according to patient size and/or use of iterative reconstruction technique. COMPARISON:  CT Head 07/01/22 FINDINGS: Brain: No evidence of acute infarction, hemorrhage, hydrocephalus, extra-axial collection or mass lesion/mass effect. Mineralization of the basal ganglia bilaterally. Enlarged and partially empty sella. Vascular: No hyperdense vessel or unexpected calcification. Skull: Normal. Negative for fracture or focal lesion. Sinuses/Orbits: No middle ear or mastoid effusion. Paranasal sinuses are notable for polypoid mucosal thickening in the left maxillary sinus. Orbits are unremarkable Other: None. IMPRESSION: 1. No acute intracranial abnormality. 2. Enlarged and partially empty sella, which is nonspecific but can be seen in the setting of idiopathic intracranial hypertension. Electronically Signed   By: Lorenza Cambridge M.D.   On: 09/08/2022 13:26    Pending Labs Unresulted Labs (From admission, onward)     Start     Ordered   09/09/22 0500  Basic metabolic panel  Tomorrow morning,   R        09/08/22 1935   09/09/22 0500  CBC  Tomorrow morning,   R        09/08/22 1935   09/08/22 2135  Potassium  Now then every 6 hours,   R (with TIMED occurrences)      09/08/22 1935   09/08/22 2026  Potassium  ONCE - STAT,   STAT        09/08/22 2025            Vitals/Pain Today's Vitals   09/08/22 1931 09/08/22 1945 09/08/22 2015 09/08/22 2030  BP:  123/78 113/67 111/69  Pulse:  (!) 120 (!) 111 (!) 103  Resp:  (!) 24 12 16   Temp: 98 F (36.7 C)     TempSrc: Oral     SpO2:  100% 100% 100%    Isolation Precautions No active  isolations  Medications Medications  sorbitol 70 % solution 30 mL (has no administration in time range)  sodium zirconium cyclosilicate (LOKELMA) packet 10 g (has no administration in time range)  sodium chloride 0.9 % bolus 1,000 mL (0 mLs Intravenous Stopped 09/08/22 1759)  calcium gluconate 1 g/ 50 mL sodium chloride IVPB (0 mg Intravenous Stopped 09/08/22 1733)  furosemide (LASIX) injection 40 mg (40 mg Intravenous Given 09/08/22 1757)  albuterol (  PROVENTIL) (2.5 MG/3ML) 0.083% nebulizer solution 15 mg (15 mg Nebulization Given 09/08/22 1753)  insulin aspart (novoLOG) injection 10 Units (10 Units Intravenous Given 09/08/22 1821)    And  dextrose 50 % solution 50 mL (50 mLs Intravenous Given 09/08/22 1823)  dextrose 10 % infusion ( Intravenous New Bag/Given 09/08/22 1950)  sodium bicarbonate 150 mEq in dextrose 5 % 1,150 mL infusion ( Intravenous New Bag/Given 09/08/22 1828)  sodium chloride 0.9 % bolus 1,000 mL (0 mLs Intravenous Stopped 09/08/22 2211)  sodium zirconium cyclosilicate (LOKELMA) packet 10 g (10 g Oral Given 09/08/22 2019)  sodium bicarbonate injection 50 mEq (50 mEq Intravenous Given 09/08/22 1941)    Mobility non-ambulatory     Focused Assessments Neuro Assessment Handoff:  Swallow screen pass? Yes          Neuro Assessment:   Neuro Checks:      Has TPA been given? No If patient is a Neuro Trauma and patient is going to OR before floor call report to 4N Charge nurse: [phone] or [phone]   R Recommendations: See Admitting Provider Note  Report given to:   Additional Notes:

## 2022-09-09 ENCOUNTER — Other Ambulatory Visit: Payer: Self-pay

## 2022-09-09 DIAGNOSIS — N179 Acute kidney failure, unspecified: Secondary | ICD-10-CM | POA: Diagnosis not present

## 2022-09-09 DIAGNOSIS — E875 Hyperkalemia: Secondary | ICD-10-CM | POA: Diagnosis not present

## 2022-09-09 DIAGNOSIS — E872 Acidosis, unspecified: Secondary | ICD-10-CM | POA: Diagnosis not present

## 2022-09-09 LAB — BASIC METABOLIC PANEL
Anion gap: 5 (ref 5–15)
Anion gap: 3 — ABNORMAL LOW (ref 5–15)
BUN: 23 mg/dL (ref 8–23)
BUN: 19 mg/dL (ref 8–23)
CO2: 11 mmol/L — ABNORMAL LOW (ref 22–32)
CO2: 12 mmol/L — ABNORMAL LOW (ref 22–32)
Calcium: 8.7 mg/dL — ABNORMAL LOW (ref 8.9–10.3)
Calcium: 8 mg/dL — ABNORMAL LOW (ref 8.9–10.3)
Chloride: 124 mmol/L — ABNORMAL HIGH (ref 98–111)
Chloride: 122 mmol/L — ABNORMAL HIGH (ref 98–111)
Creatinine, Ser: 1.27 mg/dL — ABNORMAL HIGH (ref 0.61–1.24)
Creatinine, Ser: 1.32 mg/dL — ABNORMAL HIGH (ref 0.61–1.24)
GFR, Estimated: 57 mL/min — ABNORMAL LOW (ref >60)
GFR, Estimated: 54 mL/min — ABNORMAL LOW (ref >60)
Glucose, Bld: 90 mg/dL (ref 70–99)
Glucose, Bld: 97 mg/dL (ref 70–99)
Potassium: 6.1 mmol/L — ABNORMAL HIGH (ref 3.5–5.1)
Potassium: 4.5 mmol/L (ref 3.5–5.1)
Sodium: 140 mmol/L (ref 135–145)
Sodium: 137 mmol/L (ref 135–145)

## 2022-09-09 LAB — MRSA NEXT GEN BY PCR, NASAL: MRSA by PCR Next Gen: NOT DETECTED

## 2022-09-09 LAB — CBC
HCT: 29.7 % — ABNORMAL LOW (ref 39.0–52.0)
Hemoglobin: 9.3 g/dL — ABNORMAL LOW (ref 13.0–17.0)
MCH: 30 pg (ref 26.0–34.0)
MCHC: 31.3 g/dL (ref 30.0–36.0)
MCV: 95.8 fL (ref 80.0–100.0)
Platelets: 331 10*3/uL (ref 150–400)
RBC: 3.1 MIL/uL — ABNORMAL LOW (ref 4.22–5.81)
RDW: 18.3 % — ABNORMAL HIGH (ref 11.5–15.5)
WBC: 8.6 10*3/uL (ref 4.0–10.5)
nRBC: 0 % (ref 0.0–0.2)

## 2022-09-09 LAB — GLUCOSE, CAPILLARY
Glucose-Capillary: 70 mg/dL (ref 70–99)
Glucose-Capillary: 76 mg/dL (ref 70–99)
Glucose-Capillary: 121 mg/dL — ABNORMAL HIGH (ref 70–99)
Glucose-Capillary: 106 mg/dL — ABNORMAL HIGH (ref 70–99)

## 2022-09-09 LAB — POTASSIUM
Potassium: 5.8 mmol/L — ABNORMAL HIGH (ref 3.5–5.1)
Potassium: 5.9 mmol/L — ABNORMAL HIGH (ref 3.5–5.1)
Potassium: 4.2 mmol/L (ref 3.5–5.1)

## 2022-09-09 LAB — CREATININE, URINE, RANDOM: Creatinine, Urine: 100 mg/dL

## 2022-09-09 LAB — SODIUM, URINE, RANDOM: Sodium, Ur: 122 mmol/L

## 2022-09-09 MED ORDER — SODIUM BICARBONATE 650 MG PO TABS
1300.0000 mg | ORAL_TABLET | Freq: Two times a day (BID) | ORAL | Status: AC
  Administered 2022-09-09 (×2): 1300 mg via ORAL
  Filled 2022-09-09 (×2): qty 2

## 2022-09-09 MED ORDER — INSULIN ASPART 100 UNIT/ML IV SOLN: 10.0000 [IU] | Freq: Once | INTRAVENOUS | Status: DC

## 2022-09-09 MED ORDER — SODIUM CHLORIDE 0.9 % IV BOLUS
2500.0000 mL | Freq: Once | INTRAVENOUS | Status: AC
  Administered 2022-09-09: 2500 mL via INTRAVENOUS

## 2022-09-09 MED ORDER — SODIUM ZIRCONIUM CYCLOSILICATE 10 G PO PACK
10.0000 g | PACK | Freq: Two times a day (BID) | ORAL | Status: DC
  Administered 2022-09-09: 10 g via ORAL
  Filled 2022-09-09: qty 1

## 2022-09-09 MED ORDER — CHLORHEXIDINE GLUCONATE CLOTH 2 % EX PADS
6.0000 | MEDICATED_PAD | Freq: Every day | CUTANEOUS | Status: DC
  Administered 2022-09-10 – 2022-09-11 (×3): 6 via TOPICAL

## 2022-09-09 MED ORDER — ORAL CARE MOUTH RINSE
15.0000 mL | OROMUCOSAL | Status: DC
  Administered 2022-09-10 – 2022-09-14 (×15): 15 mL via OROMUCOSAL

## 2022-09-09 MED ORDER — CALCIUM GLUCONATE-NACL 1-0.675 GM/50ML-% IV SOLN
1.0000 g | Freq: Once | INTRAVENOUS | Status: AC
  Administered 2022-09-09: 1000 mg via INTRAVENOUS
  Filled 2022-09-09: qty 50

## 2022-09-09 MED ORDER — SODIUM ZIRCONIUM CYCLOSILICATE 10 G PO PACK
10.0000 g | PACK | Freq: Three times a day (TID) | ORAL | Status: DC
  Administered 2022-09-09: 10 g via ORAL
  Filled 2022-09-09 (×2): qty 1

## 2022-09-09 MED ORDER — ORAL CARE MOUTH RINSE: 15.0000 mL | OROMUCOSAL | Status: DC | PRN

## 2022-09-09 MED ORDER — DEXTROSE 50 % IV SOLN
1.0000 | Freq: Once | INTRAVENOUS | Status: DC
  Filled 2022-09-09: qty 50

## 2022-09-09 MED ORDER — SODIUM BICARBONATE 8.4 % IV SOLN
INTRAVENOUS | Status: AC
  Filled 2022-09-09: qty 150

## 2022-09-09 NOTE — Consult Note (Signed)
WOC Nurse Consult Note: Reason for Consult: Consult requested for left heel. Performed remotely after review of the progress notes.  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 Pressure Injury POA: Yes Dressing procedure/placement/frequency: Prevalon boot to reduce pressure. It is best practice to leave dry eschar in place to heels. Topical treatment orders provided for bedside nurses to perform as follows: Paint left heel with Betadine Q day, then cover with foam dressing.  Change foam dressing Q 3 days or PRN soiling. Please re-consult if further assistance is needed.  Thank-you,  Cammie Mcgee MSN, RN, CWOCN, [place], CNS [phone]

## 2022-09-09 NOTE — Progress Notes (Addendum)
[facility] PROGRESS NOTE    Progress Note  Frank Hernandez  Frank Hernandez DOB: Mar 19, 1940 DOA: 09/08/2022 PCP: [name] (Inactive)     Brief Narrative:   Frank Hernandez is an 82 y.o. male past medical history significant for PE on Eliquis metastatic bladder cancer, multiple myeloma sent in from the nursing home for decreased appetite confusion and failure to thrive that started 2 to 3 days prior to admission.  Workup in the ED revealed a potassium of 7.4.  She was treated in the emergency room with insulin D50 bicarbonate Lasix IV fluids and Lokelma.    Assessment/Plan:   Hyperkalemia/normal anion gap metabolic acidosis: He was on oral supplementation and had an had a history of diarrhea, which could have led to hyperkalemia, along with his acidosis in the setting of acute kidney injury. His potassium is slowly improving, continue Lokelma twice a day. Change her IV fluids to bicarbonate IV drip. Will start him on oral bicarbonate recheck basic metabolic panel this afternoon.  Acute metabolic encephalopathy: Likely due to to acute kidney injury and dehydration. 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 Now resolved.  Acute kidney injury superimposed on   Chronic kidney disease, stage 3a (HCC): With a baseline creatinine of around 1 on admission 1.3. Continue IV fluids her creatinine is coming down slowly. Transition IV fluids to D5 with potassium supplements  Essential hypertension: Hold antihypertensive medication.  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  RN Pressure Injury Documentation: Pressure Injury 03/29/22 Buttocks Right Stage 1 -  Intact skin with non-blanchable redness of a localized area usually over a bony prominence. (Active)  03/29/22 2140  Location: Buttocks  Location  Orientation: Right  Staging: Stage 1 -  Intact skin with non-blanchable redness of a localized area usually over a bony prominence.  Wound Description (Comments):   Present on Admission: Yes     Pressure Injury 05/28/22 Heel Right Stage 3 -  Full thickness tissue loss. Subcutaneous fat may be visible but bone, tendon or muscle are NOT exposed. Open wound, broken from pressure & dry skin (Active)  05/28/22 1400  Location: Heel  Location Orientation: Right  Staging: Stage 3 -  Full thickness tissue loss. Subcutaneous fat may be visible but bone, tendon or muscle are NOT exposed.  Wound Description (Comments): Open wound, broken from pressure & dry skin  Present on Admission: Yes     Pressure Injury 08/08/22 Buttocks Right;Left Deep Tissue Pressure Injury - Purple or maroon localized area of discolored intact skin or blood-filled blister due to damage of underlying soft tissue from pressure and/or shear. (Active)  08/08/22 1241  Location: Buttocks  Location Orientation: Right;Left  Staging: Deep Tissue Pressure Injury - Purple or maroon localized area of discolored intact skin or blood-filled blister due to damage of underlying soft tissue from pressure and/or shear.  Wound Description (Comments):   Present on Admission:     DVT prophylaxis: eliquis Family Communication:none Status is: Inpatient Remains inpatient appropriate because: Acute kidney injury hyperkalemia metabolic acidosis    Code Status:     Code Status Orders  (From admission, onward)           Start     Ordered   09/08/22 1933  Full code  Continuous       Question:  By:  Answer:  Consent: discussion documented in EHR  09/08/22 1935           Code Status History     Date Active Date Inactive Code Status Order ID Comments User Context   07/29/2022 1204 08/16/2022 1628 Full Code [id number]  Bobette Mo, MD ED   05/28/2022 1110 06/01/2022 2315 Full Code [id number]  Maryln Gottron, MD ED   04/13/2022  1309 04/23/2022 0203 Full Code [id number]  Elease Etienne, MD ED   03/30/2022 0130 04/05/2022 2058 Full Code [id number]  Gery Pray, MD ED   06/10/2020 2243 06/25/2020 0330 Full Code [id number]  Kinsinger, De Blanch, MD ED   12/26/2015 1513 12/27/2015 2120 Full Code [id number]  Black, Lesle Chris, NP ED         IV Access:   Peripheral IV   Procedures and diagnostic studies:   US RENAL  Result Date: 09/08/2022 CLINICAL DATA:  Bladder obstruction. EXAM: RENAL / URINARY TRACT ULTRASOUND COMPLETE COMPARISON:  CT abdomen pelvis dated 07/29/2022. FINDINGS: Evaluation is very limited due to significant amount of overlying bowel gas as well as body habitus. Right Kidney: Renal measurements: 8.4 x 4.4 x 5.4 cm = volume: 105 mL. Normal echogenicity. No hydronephrosis or shadowing stone. There is a 2 cm upper pole cyst. Left Kidney: Renal measurements: 9.3 x 4.9 x 5.1 cm = volume: 120 mL. Normal echogenicity. No hydronephrosis or shadowing stone. A 4 cm upper pole cyst. Bladder: Appears normal for degree of bladder distention. Other: None. IMPRESSION: No hydronephrosis or shadowing stone. Electronically Signed   By: Elgie Collard M.D.   On: 09/08/2022 21:51   DG Hip Unilat W or Wo Pelvis 2-3 Views Right  Result Date: 09/08/2022 CLINICAL DATA:  Bilateral hip pain. EXAM: DG HIP (WITH OR WITHOUT PELVIS) 2-3V RIGHT; DG HIP (WITH OR WITHOUT PELVIS) 2-3V LEFT COMPARISON:  None Available. FINDINGS: Evaluation is limited due to positioning and soft tissue attenuation. There is no acute fracture or dislocation. Status post prior ORIF of bilateral femoral neck fractures. The visualized hardware are intact. The bones are osteopenic. Degenerative changes of the lumbar spine. The soft tissues are unremarkable. IMPRESSION: 1. No acute fracture or dislocation. 2. Status post prior ORIF of bilateral femoral neck fractures. Electronically Signed   By: Elgie Collard M.D.   On: 09/08/2022 17:44   DG Hip Unilat W or Wo  Pelvis 2-3 Views Left  Result Date: 09/08/2022 CLINICAL DATA:  Bilateral hip pain. EXAM: DG HIP (WITH OR WITHOUT PELVIS) 2-3V RIGHT; DG HIP (WITH OR WITHOUT PELVIS) 2-3V LEFT COMPARISON:  None Available. FINDINGS: Evaluation is limited due to positioning and soft tissue attenuation. There is no acute fracture or dislocation. Status post prior ORIF of bilateral femoral neck fractures. The visualized hardware are intact. The bones are osteopenic. Degenerative changes of the lumbar spine. The soft tissues are unremarkable. IMPRESSION: 1. No acute fracture or dislocation. 2. Status post prior ORIF of bilateral femoral neck fractures. Electronically Signed   By: Elgie Collard M.D.   On: 09/08/2022 17:44   DG Knee Right Port  Result Date: 09/08/2022 CLINICAL DATA:  Failure to thrive Altered mental status Decreased appetite EXAM: PORTABLE RIGHT KNEE - 1-2 VIEW COMPARISON:  07/30/2022 FINDINGS: No fracture or dislocation. Soft tissues are unremarkable. Visualized portions of intramedullary rod and screws are intact without periprosthetic fracture or lucency. Advanced degenerative changes of the right knee are again seen. Lateral view is limited due to obliquity. IMPRESSION: No acute abnormality of the right knee. Electronically Signed   By: Mauri Reading  Mir M.D.   On: 09/08/2022 13:50   CT HEAD WO CONTRAST  Result Date: 09/08/2022 CLINICAL DATA:  Mental status change, unknown cause EXAM: CT HEAD WITHOUT CONTRAST TECHNIQUE: Contiguous axial images were obtained from the base of the skull through the vertex without intravenous contrast. RADIATION DOSE REDUCTION: This exam was performed according to the departmental dose-optimization program which includes automated exposure control, adjustment of the mA and/or kV according to patient size and/or use of iterative reconstruction technique. COMPARISON:  CT Head 07/01/22 FINDINGS: Brain: No evidence of acute infarction, hemorrhage, hydrocephalus, extra-axial collection or  mass lesion/mass effect. Mineralization of the basal ganglia bilaterally. Enlarged and partially empty sella. Vascular: No hyperdense vessel or unexpected calcification. Skull: Normal. Negative for fracture or focal lesion. Sinuses/Orbits: No middle ear or mastoid effusion. Paranasal sinuses are notable for polypoid mucosal thickening in the left maxillary sinus. Orbits are unremarkable Other: None. IMPRESSION: 1. No acute intracranial abnormality. 2. Enlarged and partially empty sella, which is nonspecific but can be seen in the setting of idiopathic intracranial hypertension. Electronically Signed   By: Lorenza Cambridge M.D.   On: 09/08/2022 13:26     Medical Consultants:   None.   Subjective:    Frank Hernandez more awake today wants to eat.  Objective:    Vitals:   09/09/22 0200 09/09/22 0300 09/09/22 0400 09/09/22 0425  BP: 118/69 128/71 (!) 144/74   Pulse: (!) 114 (!) 112 (!) 113   Resp: (!) 24 20 18    Temp:    98.7 F (37.1 C)  TempSrc:    Oral  SpO2: 100% 100% 100%   Weight:       SpO2: 100 %   Intake/Output Summary (Last 24 hours) at 09/09/2022 0651 Last data filed at 09/09/2022 0300 Gross per 24 hour  Intake 1610.29 ml  Output --  Net 1610.29 ml   Filed Weights   09/09/22 0119  Weight: 72.6 kg    Exam: General exam: In no acute distress. Respiratory system: Good air movement and clear to auscultation. Cardiovascular system: S1 & S2 heard, RRR. No JVD.[name] Kitchen  Gastrointestinal system: Abdomen is nondistended, soft and nontender.  Extremities: No pedal edema. Skin: No rashes, lesions or ulcers Psychiatry: Judgement and insight appear normal. Mood & affect appropriate.    Data Reviewed:    Labs: Basic Metabolic Panel: Recent Labs  Lab 09/08/22 1252 09/08/22 1616 09/08/22 2213 09/09/22 0021 09/09/22 0303  NA 136  --   --   --  140  K >7.5* >7.5*   < > 5.8* 6.1*  CL 120*  --   --   --  124*  CO2 8*  --   --   --  11*  GLUCOSE 94 71  --   --  90  BUN  25*  --   --   --  23  CREATININE 1.32*  --   --   --  1.27*  CALCIUM 9.2  --   --   --  8.7*  MG 1.8  --   --   --   --   PHOS  --  2.2*  --   --   --    < > = values in this interval not displayed.   GFR Estimated Creatinine Clearance: 45.6 mL/min (A) (by C-G formula based on SCr of 1.27 mg/dL (H)). Liver Function Tests: Recent Labs  Lab 09/08/22 1252  AST 35  ALT 20  ALKPHOS 145*  BILITOT 0.5  PROT 6.9  ALBUMIN 3.8   No results for input(s): "LIPASE", "AMYLASE" in the last 168 hours. No results for input(s): "AMMONIA" in the last 168 hours. Coagulation profile No results for input(s): "INR", "PROTIME" in the last 168 hours. COVID-19 Labs  Recent Labs    09/08/22 1616  LDH 216*    Lab Results  Component Value Date   SARSCOV2NAA NEGATIVE 07/29/2022   SARSCOV2NAA NEGATIVE 03/23/2022   SARSCOV2NAA NEGATIVE 06/21/2020   SARSCOV2NAA POSITIVE (A) 06/20/2020    CBC: Recent Labs  Lab 09/08/22 1252 09/09/22 0303  WBC 9.6 8.6  NEUTROABS 7.6  --   HGB 10.5* 9.3*  HCT 33.7* 29.7*  MCV 96.3 95.8  PLT 388 331   Cardiac Enzymes: No results for input(s): "CKTOTAL", "CKMB", "CKMBINDEX", "TROPONINI" in the last 168 hours. BNP (last 3 results) No results for input(s): "PROBNP" in the last 8760 hours. CBG: Recent Labs  Lab 09/08/22 1414 09/08/22 1931 09/08/22 1934 09/09/22 0608 09/09/22 0609  GLUCAP 87 86 85 70 76   D-Dimer: No results for input(s): "DDIMER" in the last 72 hours. Hgb A1c: No results for input(s): "HGBA1C" in the last 72 hours. Lipid Profile: No results for input(s): "CHOL", "HDL", "LDLCALC", "TRIG", "CHOLHDL", "LDLDIRECT" in the last 72 hours. Thyroid function studies: No results for input(s): "TSH", "T4TOTAL", "T3FREE", "THYROIDAB" in the last 72 hours.  Invalid input(s): "FREET3" Anemia work up: No results for input(s): "VITAMINB12", "FOLATE", "FERRITIN", "TIBC", "IRON", "RETICCTPCT" in the last 72 hours. Sepsis Labs: Recent Labs  Lab  09/08/22 1252 09/09/22 0303  WBC 9.6 8.6   Microbiology Recent Results (from the past 240 hour(s))  MRSA Next Gen by PCR, Nasal     Status: None   Collection Time: 09/09/22  2:36 AM   Specimen: Nasal Mucosa; Nasal Swab  Result Value Ref Range Status   MRSA by PCR Next Gen NOT DETECTED NOT DETECTED Final    Comment: (NOTE) The GeneXpert MRSA Assay (FDA approved for NASAL specimens only), is one component of a comprehensive MRSA colonization surveillance program. It is not intended to diagnose MRSA infection nor to guide or monitor treatment for MRSA infections. Test performance is not FDA approved in patients less than 72 years old. Performed at [facility], [address] [place], [place] [address]      Medications:    Chlorhexidine Gluconate Cloth  6 each Topical Daily   insulin aspart  10 Units Intravenous Once   And   dextrose  1 ampule Intravenous Once   sodium zirconium cyclosilicate  10 g Oral BID   Continuous Infusions:  sodium chloride 75 mL/hr at 09/09/22 0300   calcium gluconate 1,000 mg (09/09/22 0648)      LOS: 1 day   Marinda Elk  [facility]  09/09/2022, 6:51 AM

## 2022-09-09 NOTE — Progress Notes (Signed)
[facility] Progress Note  Presentation: 33M with multiple myeloma, recent admission for diarrhea related to colonic pseudoobstruction and consequential electrolyte abnormalities returns with confusion, severe hyperkalemia, normal anion gap metabolic acidosis.  He also has a mild AKI.    Subjective:   Vitals:   09/09/22 0500 09/09/22 0600 09/09/22 0800 09/09/22 1150  BP: 119/60 (!) 113/54 123/65   Pulse: (!) 107 (!) 107 (!) 106   Resp: (!) 21 20 (!) 25   Temp:   97.8 F (36.6 C) 98.3 F (36.8 C)  TempSrc:   Axillary Oral  SpO2: 100% 100% 100%   Weight:        Exam: GEN: Chronically ill-appearing, confused, not coherent and able to provide a history ENT: Poor dentition, dry mucous membranes EYES: Eyes are sunken CV: Tachycardic, regular, no rub PULM: Clear bilaterally ABD: Soft, no suprapubic fullness, nondistended SKIN: No rashes or lesions EXT: No significant edema present NEUROLOGICAL: Awake, oriented to self, year, but not to place or date.      Home meds - zorvirax, eliquis, duoneb, imodium, mVI, protonix, klorcon, ensur plus, prns    Renal US - 8-10 cm kidneys, no hydro  UNa, UCr pending   EKG 7/10 - NSR, no peaked T's or widening of QRS  Assessment/ Plan Hyperkalemia: severe, EKG without characteristic changes.  Likely driven by reduced GFR, metabolic acidosis, and home KCl potassium dosing of 80 mEq total daily.  Improving from K+ > 7.5 down to 5.9 today. Cont lokelma and renal diet (low K diet).  Non-anion gap metabolic acidosis: Would likely be due to diarrhea but not fully confirmed at this is present.  Would not expect to this degree if just due to AKI. AKI: Mild, peak creat 1.3, down to 1.2 today. Baseline is 0.9- 1.1. Cause is most likely hypovolemia/prerenal. Volume depletion: seems more alert, still sig dry on exam but improving.  Confusion/encephalopathy: Likely #1- #3 contributing. Multiple myeloma currently not under therapy    Plan Currently a poor candidate for dialysis and not likely to come to that. Cont supportive care, lokelma, low K diet. Avoid supplemetal KCl.  Continue bicarbonate infusion for acidemia, high K+ and AKI.   Still dry, will bolus 2.5 L over 6-8 hrs     Vinson Moselle MD  [facility] 09/09/2022, 1:35 PM  Recent Labs  Lab 09/08/22 1252 09/08/22 1616 09/08/22 2213 09/09/22 0303 09/09/22 0956  HGB 10.5*  --   --  9.3*  --   ALBUMIN 3.8  --   --   --   --   CALCIUM 9.2  --   --  8.7*  --   PHOS  --  2.2*  --   --   --   CREATININE 1.32*  --   --  1.27*  --   K >7.5* >7.5*   < > 6.1* 5.9*   < > = values in this interval not displayed.   No results for input(s): "IRON", "TIBC", "FERRITIN" in the last 168 hours. Inpatient medications:  Chlorhexidine Gluconate Cloth  6 each Topical Daily   sodium bicarbonate  1,300 mg Oral BID   sodium zirconium cyclosilicate  10 g Oral BID    sodium bicarbonate 150 mEq in dextrose 5 % 1,150 mL infusion 75 mL/hr at 09/09/22 0815   mouth rinse, sorbitol

## 2022-09-09 NOTE — ED Notes (Signed)
Per Dr. Marisue Humble, hold on foley catheter and place purewick instead.  Pt was able to have large void in brief.

## 2022-09-09 NOTE — TOC Initial Note (Signed)
Transition of Care [facility]) - Initial/Assessment Note    Patient Details  Name: Frank Hernandez MRN: [id number] Date of Birth: Sep 07, 1940  Transition of Care Frank Hernandez) CM/SW Contact:    Frank Atlas, RN Phone Number: 09/09/2022, 12:09 PM  Clinical Narrative:   Received message from Frank Hernandez with [facility] indicating patient can return to SNF for short term rehab. Per chart review patient currently in [facility] SDU for hyperkalemia, plan is to transfer to [facility]/ neuro trauma. Patient sent from [facility] for AMS x2days.  This RNCM spoke with patient's wife Frank Hernandez who reports once patient is medically stable she would like for him to return to Frank Hernandez.     TOC will continue to follow for discharge needs.              Expected Discharge Plan: Skilled Nursing Facility Barriers to Discharge: Continued Medical Work up   Patient Goals and CMS Choice Patient states their goals for this hospitalization and ongoing recovery are:: return to short term rehab SNF [facility]) CMS Medicare.gov Compare Post Acute Care list provided to:: Patient Represenative (must comment) (Frank Hernandez) Choice offered to / list presented to : Spouse [place] ownership interest in [facility].provided to:: Spouse    Expected Discharge Plan and Services In-house Referral: NA Discharge Planning Services: CM Consult Post Acute Care Choice: Skilled Nursing Facility Living arrangements for the past 2 months: Skilled Nursing Facility                 DME Arranged: N/A DME Agency: NA       HH Arranged: NA HH Agency: NA        Prior Living Arrangements/Services Living arrangements for the past 2 months: Skilled Nursing Facility Lives with:: Facility Resident Patient language and need for interpreter reviewed:: Yes Do you feel safe going back to the place where you live?: Yes      Need for Family Participation in Patient Care: No (Comment) Care giver support system in place?:  Yes (comment) Current home services: DME (cane, walker, wheelchair) Criminal Activity/Legal Involvement Pertinent to Current Situation/Hospitalization: No - Comment as needed  Activities of Daily Living      Permission Sought/Granted Permission sought to share information with : Case Manager Permission granted to share information with : Yes, Verbal Permission Granted  Share Information with NAME: Case Manager           Emotional Assessment Appearance:: Appears stated age Attitude/Demeanor/Rapport: Gracious Affect (typically observed): Accepting Orientation: : Oriented to Self, Oriented to Place, Oriented to  Time Alcohol / Substance Use: Not Applicable Psych Involvement: No (comment)  Admission diagnosis:  Hyperkalemia [E87.5] Disorientation [R41.0] AKI (acute kidney injury) (HCC) [N17.9] Patient Active Problem List   Diagnosis Date Noted   Hyperkalemia 09/08/2022   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/09/2022   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   Hypomagnesemia 07/29/2022   Bowel obstruction (HCC) 07/29/2022   Prolonged QT interval 07/29/2022   Cellulitis of both lower extremities 07/29/2022   Acute pulmonary embolism (HCC) 07/29/2022   Glaucoma 07/29/2022   Bradyarrhythmia 06/01/2022   Sinus pause 05/29/2022   Multiple myeloma (HCC) 05/29/2022   AKI (acute kidney injury) (HCC) 05/28/2022   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   Hypotension 05/28/2022   Dehydration 05/28/2022   Diarrhea 05/28/2022   Lactic acidosis 05/28/2022   Counseling regarding advance care planning and goals of care 04/22/2022   Multiple myeloma not having achieved remission (HCC) 04/21/2022  Vitamin D deficiency 04/19/2022   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) 04/15/2022   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   Closed displaced subtrochanteric fracture of right femur (HCC) 04/13/2022   Normocytic anemia 04/13/2022    Chronic kidney disease, stage 3a (HCC) 04/03/2022   Decreased oral intake 04/02/2022   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   Essential hypertension 04/01/2022   Pain in joint, lower leg 04/01/2022   Effusion of right knee 03/30/2022   Pressure injury of skin 03/30/2022   Effusion, right knee 03/30/2022   Concussion with unknown loss of consciousness status 12/24/2020   Hypokalemia    Rib fractures 06/10/2020   Severe essential hypertension 11/15/2018   Syncope 12/26/2015   Pre-diabetes 12/26/2015   PCP:  [name] (Inactive) Pharmacy:  No Pharmacies Listed    Social Determinants of Health (SDOH) Social History: SDOH Screenings   Food Insecurity: No Food Insecurity (07/30/2022)  Recent Concern: Food Insecurity - Food Insecurity Present (05/28/2022)  Housing: Low Risk  (07/30/2022)  Transportation Needs: No Transportation Needs (07/30/2022)  Recent Concern: Transportation Needs - Unmet Transportation Needs (05/28/2022)  Utilities: Not At Risk (07/30/2022)  Depression (PHQ2-9): Low Risk  (04/29/2022)  Tobacco Use: Low Risk  (08/12/2022)   SDOH Interventions:     Readmission Risk Interventions    09/09/2022   12:00 PM 08/16/2022   10:15 AM 08/03/2022    8:39 AM  Readmission Risk Prevention Plan  Transportation Screening Complete Complete Complete  Medication Review Oceanographer) Complete Complete Complete  PCP or Specialist appointment within 3-5 days of discharge Complete Complete   HRI or Home Care Consult Complete Complete Complete  SW Recovery Care/Counseling Consult Complete Complete Complete  Palliative Care Screening Not Applicable Not Applicable Not Applicable  Skilled Nursing Facility Complete Complete Complete

## 2022-09-10 ENCOUNTER — Ambulatory Visit: Payer: Medicare PPO

## 2022-09-10 ENCOUNTER — Other Ambulatory Visit: Payer: Medicare PPO

## 2022-09-10 ENCOUNTER — Ambulatory Visit: Payer: Medicare PPO | Admitting: Hematology

## 2022-09-10 DIAGNOSIS — N179 Acute kidney failure, unspecified: Secondary | ICD-10-CM | POA: Diagnosis not present

## 2022-09-10 DIAGNOSIS — E872 Acidosis, unspecified: Secondary | ICD-10-CM | POA: Diagnosis not present

## 2022-09-10 DIAGNOSIS — E875 Hyperkalemia: Secondary | ICD-10-CM | POA: Diagnosis not present

## 2022-09-10 LAB — GLUCOSE, CAPILLARY
Glucose-Capillary: 68 mg/dL — ABNORMAL LOW (ref 70–99)
Glucose-Capillary: 89 mg/dL (ref 70–99)
Glucose-Capillary: 96 mg/dL (ref 70–99)

## 2022-09-10 LAB — BASIC METABOLIC PANEL
Anion gap: 7 (ref 5–15)
BUN: 15 mg/dL (ref 8–23)
CO2: 13 mmol/L — ABNORMAL LOW (ref 22–32)
Calcium: 8 mg/dL — ABNORMAL LOW (ref 8.9–10.3)
Chloride: 122 mmol/L — ABNORMAL HIGH (ref 98–111)
Creatinine, Ser: 1.07 mg/dL (ref 0.61–1.24)
GFR, Estimated: >60 mL/min (ref >60)
Glucose, Bld: 74 mg/dL (ref 70–99)
Potassium: 3.9 mmol/L (ref 3.5–5.1)
Sodium: 142 mmol/L (ref 135–145)

## 2022-09-10 MED ORDER — APIXABAN 5 MG PO TABS
5.0000 mg | ORAL_TABLET | Freq: Two times a day (BID) | ORAL | Status: DC
  Administered 2022-09-10 – 2022-09-14 (×9): 5 mg via ORAL
  Filled 2022-09-10 (×9): qty 1

## 2022-09-10 MED ORDER — NEPRO/CARBSTEADY PO LIQD
237.0000 mL | Freq: Two times a day (BID) | ORAL | Status: DC
  Administered 2022-09-10: 237 mL via ORAL
  Filled 2022-09-10 (×2): qty 237

## 2022-09-10 MED ORDER — ACETAMINOPHEN 325 MG PO TABS
650.0000 mg | ORAL_TABLET | Freq: Four times a day (QID) | ORAL | Status: DC | PRN
  Administered 2022-09-11 (×2): 650 mg via ORAL
  Filled 2022-09-10 (×2): qty 2

## 2022-09-10 MED ORDER — SODIUM BICARBONATE 650 MG PO TABS
1300.0000 mg | ORAL_TABLET | Freq: Two times a day (BID) | ORAL | Status: AC
  Administered 2022-09-10 (×2): 1300 mg via ORAL
  Filled 2022-09-10 (×2): qty 2

## 2022-09-10 MED ORDER — ENSURE ENLIVE PO LIQD
237.0000 mL | Freq: Three times a day (TID) | ORAL | Status: DC
  Administered 2022-09-10 – 2022-09-14 (×10): 237 mL via ORAL

## 2022-09-10 MED ORDER — PANTOPRAZOLE SODIUM 40 MG PO TBEC
40.0000 mg | DELAYED_RELEASE_TABLET | Freq: Two times a day (BID) | ORAL | Status: DC
  Administered 2022-09-10 – 2022-09-14 (×9): 40 mg via ORAL
  Filled 2022-09-10 (×9): qty 1

## 2022-09-10 NOTE — Progress Notes (Signed)
[facility] Progress Note  Presentation: 2M with multiple myeloma, recent admission for diarrhea related to colonic pseudoobstruction and consequential electrolyte abnormalities returns with confusion, severe hyperkalemia, normal anion gap metabolic acidosis.  He also has a mild AKI.    Subjective: pt seen in room, no c/o's   Vitals:   09/10/22 0504 09/10/22 0600 09/10/22 0745 09/10/22 1218  BP:  (!) 144/44  106/62  Pulse:  83  66  Resp:  15  18  Temp: 98 F (36.7 C)  99 F (37.2 C) 97.9 F (36.6 C)  TempSrc: Oral  Oral Oral  SpO2:  100%  100%  Weight: 76.1 kg     Height:        Exam: GEN: Chronically ill-appearing, confused, not coherent and able to provide a history ENT: Poor dentition, dry mucous membranes EYES: Eyes are sunken CV: Tachycardic, regular, no rub PULM: Clear bilaterally ABD: Soft, no suprapubic fullness, nondistended SKIN: No rashes or lesions EXT: No significant edema present NEUROLOGICAL: Awake, oriented to self, year, but not to place or date.      Home meds - zorvirax, eliquis, duoneb, imodium, mVI, protonix, klorcon, ensur plus, prns    Renal US - 8-10 cm kidneys, no hydro  UNa, UCr pending   EKG 7/10 - NSR, no peaked T's or widening of QRS   Today --> BUN 15, creat 1.07, K+3.9  Assessment/ Plan Hyperkalemia: severe, but EKG without characteristic changes.  Likely driven by reduced GFR, metabolic acidosis, and home KCl potassium dosing of 80 mEq total daily.  Improving from K+ > 7.5 down to 5.9 yest and down to 3.9 today.  Lokelma dc'd. Will sign off.  AKI: baseline is 0.9- 1.1. Cause is most likely hypovolemia/prerenal. Pt looked severely volume depleted on exam initially. Mostly resolved and creat down to 1.0 today which is his baseline.  Metabolic acidosis: Would likely be due to diarrhea and/or AKI.  Recommend NaHCO3 tabs 1300 bid for 5-7 days or until serum CO2 > 20.  Volume depletion: improved Confusion/encephalopathy:  improving Multiple myeloma currently not under therapy    Vinson Moselle MD  [facility] 09/10/2022, 2:44 PM  Recent Labs  Lab 09/08/22 1252 09/08/22 1616 09/08/22 2213 09/09/22 0303 09/09/22 0956 09/09/22 1716 09/09/22 2146 09/10/22 0303  HGB 10.5*  --   --  9.3*  --   --   --   --   ALBUMIN 3.8  --   --   --   --   --   --   --   CALCIUM 9.2  --   --  8.7*  --  8.0*  --  8.0*  PHOS  --  2.2*  --   --   --   --   --   --   CREATININE 1.32*  --   --  1.27*  --  1.32*  --  1.07  K >7.5* >7.5*   < > 6.1*   < > 4.5 4.2 3.9   < > = values in this interval not displayed.   No results for input(s): "IRON", "TIBC", "FERRITIN" in the last 168 hours. Inpatient medications:  apixaban  5 mg Oral BID   Chlorhexidine Gluconate Cloth  6 each Topical Daily   feeding supplement  237 mL Oral TID BM   mouth rinse  15 mL Mouth Rinse 4 times per day   pantoprazole  40 mg Oral BID   sodium bicarbonate  1,300 mg Oral BID  mouth rinse, sorbitol

## 2022-09-10 NOTE — Progress Notes (Addendum)
[facility] PROGRESS NOTE    Progress Note  Frank Hernandez  WUJ:[id number] DOB: 11/04/1940 DOA: 09/08/2022 PCP: [name] (Inactive)     Brief Narrative:   Frank Hernandez is an 82 y.o. male past medical history significant for PE on Eliquis metastatic bladder cancer, multiple myeloma sent in from the nursing home for decreased appetite confusion and failure to thrive that started 2 to 3 days prior to admission.  Workup in the ED revealed a potassium of 7.4.  She was treated in the emergency room with insulin D50 bicarbonate Lasix IV fluids and Lokelma.    Assessment/Plan:   Hyperkalemia/normal anion gap metabolic acidosis: He was on oral supplementation and had an had a history of diarrhea, which could have led to hyperkalemia, along with his acidosis in the setting of acute kidney injury. His potassium is improved this morning, continue oral bicarbonate. Further management per nephrology. Transferred to MedSurg.  Acute metabolic encephalopathy: Likely due to to acute kidney injury and dehydration. 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 Now resolved.  Acute kidney injury superimposed on   Chronic kidney disease, stage 3a (HCC): With a baseline creatinine of around 1 on admission 1.3.  Likely prerenal azotemia Resolved with IV fluid hydration.  Essential hypertension: Currently on no antihypertensive medication.  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 (Active)  03/29/22 2140  Location: Buttocks  Location Orientation: Right  Staging: Stage  1 -  Intact skin with non-blanchable redness of a localized area usually over a bony prominence.  Wound Description (Comments):   Present on Admission: Yes     Pressure Injury 05/28/22 Heel Right Unstageable - Full thickness tissue loss in which the base of the injury is covered by slough (yellow, tan, gray, green or brown) and/or eschar (tan, brown or black) in the wound bed. Open wound, broken from pressure & (Active)  05/28/22 1400  Location: Heel  Location Orientation: Right  Staging: Unstageable - Full thickness tissue loss in which the base of the injury is covered by slough (yellow, tan, gray, green or brown) and/or eschar (tan, brown or black) in the wound bed.  Wound Description (Comments): Open wound, broken from pressure & dry skin  Present on Admission: Yes  Dressing Type Foam - Lift dressing to assess site every shift 09/09/22 2000     Pressure Injury 08/08/22 Buttocks Right;Left Deep Tissue Pressure Injury - Purple or maroon localized area of discolored intact skin or blood-filled blister due to damage of underlying soft tissue from pressure and/or shear. (Active)  08/08/22 1241  Location: Buttocks  Location Orientation: Right;Left  Staging: Deep Tissue Pressure Injury - Purple or maroon localized area of discolored intact skin or blood-filled blister due to damage of underlying soft tissue from pressure and/or shear.  Wound Description (Comments):   Present on Admission:     DVT prophylaxis: eliquis Family Communication:none Status is: Inpatient Remains inpatient appropriate because: Acute kidney injury hyperkalemia metabolic acidosis    Code Status:     Code Status Orders  (From admission, onward)           Start     Ordered  09/08/22 1933  Full code  Continuous       Question:  By:  Answer:  Consent: discussion documented in EHR   09/08/22 1935           Code Status History     Date Active Date Inactive Code Status Order ID Comments User Context    07/29/2022 1204 08/16/2022 1628 Full Code [id number]  Bobette Mo, MD ED   05/28/2022 1110 06/01/2022 2315 Full Code [id number]  Maryln Gottron, MD ED   04/13/2022 1309 04/23/2022 0203 Full Code [id number]  Elease Etienne, MD ED   03/30/2022 0130 04/05/2022 2058 Full Code [id number]  Gery Pray, MD ED   06/10/2020 2243 06/25/2020 0330 Full Code [id number]  Kinsinger, De Blanch, MD ED   12/26/2015 1513 12/27/2015 2120 Full Code [id number]  Black, Lesle Chris, NP ED         IV Access:   Peripheral IV   Procedures and diagnostic studies:   US RENAL  Result Date: 09/08/2022 CLINICAL DATA:  Bladder obstruction. EXAM: RENAL / URINARY TRACT ULTRASOUND COMPLETE COMPARISON:  CT abdomen pelvis dated 07/29/2022. FINDINGS: Evaluation is very limited due to significant amount of overlying bowel gas as well as body habitus. Right Kidney: Renal measurements: 8.4 x 4.4 x 5.4 cm = volume: 105 mL. Normal echogenicity. No hydronephrosis or shadowing stone. There is a 2 cm upper pole cyst. Left Kidney: Renal measurements: 9.3 x 4.9 x 5.1 cm = volume: 120 mL. Normal echogenicity. No hydronephrosis or shadowing stone. A 4 cm upper pole cyst. Bladder: Appears normal for degree of bladder distention. Other: None. IMPRESSION: No hydronephrosis or shadowing stone. Electronically Signed   By: Elgie Collard M.D.   On: 09/08/2022 21:51   DG Hip Unilat W or Wo Pelvis 2-3 Views Right  Result Date: 09/08/2022 CLINICAL DATA:  Bilateral hip pain. EXAM: DG HIP (WITH OR WITHOUT PELVIS) 2-3V RIGHT; DG HIP (WITH OR WITHOUT PELVIS) 2-3V LEFT COMPARISON:  None Available. FINDINGS: Evaluation is limited due to positioning and soft tissue attenuation. There is no acute fracture or dislocation. Status post prior ORIF of bilateral femoral neck fractures. The visualized hardware are intact. The bones are osteopenic. Degenerative changes of the lumbar spine. The soft tissues are unremarkable. IMPRESSION: 1. No acute fracture or  dislocation. 2. Status post prior ORIF of bilateral femoral neck fractures. Electronically Signed   By: Elgie Collard M.D.   On: 09/08/2022 17:44   DG Hip Unilat W or Wo Pelvis 2-3 Views Left  Result Date: 09/08/2022 CLINICAL DATA:  Bilateral hip pain. EXAM: DG HIP (WITH OR WITHOUT PELVIS) 2-3V RIGHT; DG HIP (WITH OR WITHOUT PELVIS) 2-3V LEFT COMPARISON:  None Available. FINDINGS: Evaluation is limited due to positioning and soft tissue attenuation. There is no acute fracture or dislocation. Status post prior ORIF of bilateral femoral neck fractures. The visualized hardware are intact. The bones are osteopenic. Degenerative changes of the lumbar spine. The soft tissues are unremarkable. IMPRESSION: 1. No acute fracture or dislocation. 2. Status post prior ORIF of bilateral femoral neck fractures. Electronically Signed   By: Elgie Collard M.D.   On: 09/08/2022 17:44   DG Knee Right Port  Result Date: 09/08/2022 CLINICAL DATA:  Failure to thrive Altered mental status Decreased appetite EXAM: PORTABLE RIGHT KNEE - 1-2 VIEW COMPARISON:  07/30/2022 FINDINGS: No fracture or dislocation. Soft tissues are unremarkable. Visualized portions of intramedullary rod and screws are intact without periprosthetic fracture or lucency. Advanced degenerative changes of the right  knee are again seen. Lateral view is limited due to obliquity. IMPRESSION: No acute abnormality of the right knee. Electronically Signed   By: Acquanetta Belling M.D.   On: 09/08/2022 13:50   CT HEAD WO CONTRAST  Result Date: 09/08/2022 CLINICAL DATA:  Mental status change, unknown cause EXAM: CT HEAD WITHOUT CONTRAST TECHNIQUE: Contiguous axial images were obtained from the base of the skull through the vertex without intravenous contrast. RADIATION DOSE REDUCTION: This exam was performed according to the departmental dose-optimization program which includes automated exposure control, adjustment of the mA and/or kV according to patient size and/or  use of iterative reconstruction technique. COMPARISON:  CT Head 07/01/22 FINDINGS: Brain: No evidence of acute infarction, hemorrhage, hydrocephalus, extra-axial collection or mass lesion/mass effect. Mineralization of the basal ganglia bilaterally. Enlarged and partially empty sella. Vascular: No hyperdense vessel or unexpected calcification. Skull: Normal. Negative for fracture or focal lesion. Sinuses/Orbits: No middle ear or mastoid effusion. Paranasal sinuses are notable for polypoid mucosal thickening in the left maxillary sinus. Orbits are unremarkable Other: None. IMPRESSION: 1. No acute intracranial abnormality. 2. Enlarged and partially empty sella, which is nonspecific but can be seen in the setting of idiopathic intracranial hypertension. Electronically Signed   By: Lorenza Cambridge M.D.   On: 09/08/2022 13:26     Medical Consultants:   None.   Subjective:    Frank Hernandez no is feels better.  Objective:    Vitals:   09/10/22 0335 09/10/22 0400 09/10/22 0504 09/10/22 0600  BP:  (!) 139/58  (!) 144/44  Pulse:  75  83  Resp:  16  15  Temp: (!) 97.2 F (36.2 C)  98 F (36.7 C)   TempSrc: Axillary  Oral   SpO2:  100%  100%  Weight:  76.1 kg 76.1 kg   Height:       SpO2: 100 %   Intake/Output Summary (Last 24 hours) at 09/10/2022 0646 Last data filed at 09/10/2022 0505 Gross per 24 hour  Intake 3781.62 ml  Output 630 ml  Net 3151.62 ml   Filed Weights   09/09/22 0119 09/10/22 0400 09/10/22 0504  Weight: 72.6 kg 76.1 kg 76.1 kg    Exam: General exam: In no acute distress. Respiratory system: Good air movement and clear to auscultation. Cardiovascular system: S1 & S2 heard, RRR. No JVD. Gastrointestinal system: Abdomen is nondistended, soft and nontender.  Extremities: No pedal edema. Skin: No rashes, lesions or ulcers Psychiatry: Judgement and insight appear normal. Mood & affect appropriate.  Data Reviewed:    Labs: Basic Metabolic Panel: Recent Labs   Lab 09/08/22 1252 09/08/22 1616 09/08/22 2213 09/09/22 0303 09/09/22 0956 09/09/22 1716 09/09/22 2146 09/10/22 0303  NA 136  --   --  140  --  137  --  142  K >7.5* >7.5*   < > 6.1*   < > 4.5 4.2 3.9  CL 120*  --   --  124*  --  122*  --  122*  CO2 8*  --   --  11*  --  12*  --  13*  GLUCOSE 94 71  --  90  --  97  --  74  BUN 25*  --   --  23  --  19  --  15  CREATININE 1.32*  --   --  1.27*  --  1.32*  --  1.07  CALCIUM 9.2  --   --  8.7*  --  8.0*  --  8.0*  MG 1.8  --   --   --   --   --   --   --   PHOS  --  2.2*  --   --   --   --   --   --    < > = values in this interval not displayed.   GFR Estimated Creatinine Clearance: 54.1 mL/min (by C-G formula based on SCr of 1.07 mg/dL). Liver Function Tests: Recent Labs  Lab 09/08/22 1252  AST 35  ALT 20  ALKPHOS 145*  BILITOT 0.5  PROT 6.9  ALBUMIN 3.8   No results for input(s): "LIPASE", "AMYLASE" in the last 168 hours. No results for input(s): "AMMONIA" in the last 168 hours. Coagulation profile No results for input(s): "INR", "PROTIME" in the last 168 hours. COVID-19 Labs  Recent Labs    09/08/22 1616  LDH 216*    Lab Results  Component Value Date   SARSCOV2NAA NEGATIVE 07/29/2022   SARSCOV2NAA NEGATIVE 03/23/2022   SARSCOV2NAA NEGATIVE 06/21/2020   SARSCOV2NAA POSITIVE (A) 06/20/2020    CBC: Recent Labs  Lab 09/08/22 1252 09/09/22 0303  WBC 9.6 8.6  NEUTROABS 7.6  --   HGB 10.5* 9.3*  HCT 33.7* 29.7*  MCV 96.3 95.8  PLT 388 331   Cardiac Enzymes: No results for input(s): "CKTOTAL", "CKMB", "CKMBINDEX", "TROPONINI" in the last 168 hours. BNP (last 3 results) No results for input(s): "PROBNP" in the last 8760 hours. CBG: Recent Labs  Lab 09/08/22 1934 09/09/22 0608 09/09/22 0609 09/09/22 0650 09/09/22 0805  GLUCAP 85 70 76 121* 106*   D-Dimer: No results for input(s): "DDIMER" in the last 72 hours. Hgb A1c: No results for input(s): "HGBA1C" in the last 72 hours. Lipid  Profile: No results for input(s): "CHOL", "HDL", "LDLCALC", "TRIG", "CHOLHDL", "LDLDIRECT" in the last 72 hours. Thyroid function studies: No results for input(s): "TSH", "T4TOTAL", "T3FREE", "THYROIDAB" in the last 72 hours.  Invalid input(s): "FREET3" Anemia work up: No results for input(s): "VITAMINB12", "FOLATE", "FERRITIN", "TIBC", "IRON", "RETICCTPCT" in the last 72 hours. Sepsis Labs: Recent Labs  Lab 09/08/22 1252 09/09/22 0303  WBC 9.6 8.6   Microbiology Recent Results (from the past 240 hour(s))  MRSA Next Gen by PCR, Nasal     Status: None   Collection Time: 09/09/22  2:36 AM   Specimen: Nasal Mucosa; Nasal Swab  Result Value Ref Range Status   MRSA by PCR Next Gen NOT DETECTED NOT DETECTED Final    Comment: (NOTE) The GeneXpert MRSA Assay (FDA approved for NASAL specimens only), is one component of a comprehensive MRSA colonization surveillance program. It is not intended to diagnose MRSA infection nor to guide or monitor treatment for MRSA infections. Test performance is not FDA approved in patients less than 76 years old. Performed at [facility], [address] [place], [place] [address]      Medications:    Chlorhexidine Gluconate Cloth  6 each Topical Daily   mouth rinse  15 mL Mouth Rinse 4 times per day   sodium bicarbonate  1,300 mg Oral BID   Continuous Infusions:      LOS: 2 days   Marinda Elk  [facility]  09/10/2022, 6:46 AM

## 2022-09-10 NOTE — Progress Notes (Signed)
Initial Nutrition Assessment  DOCUMENTATION CODES:   Severe malnutrition in context of chronic illness  INTERVENTION:  - DYS 1 diet.  - Ensure Plus High Protein po BID, each supplement provides 350 kcal and 20 grams of protein. - Monitor weight trends.    NUTRITION DIAGNOSIS:   Severe Malnutrition related to chronic illness as evidenced by mild fat depletion, moderate muscle depletion, energy intake < 75% for > or equal to 1 month, percent weight loss (23% in 3 months).  GOAL:   Patient will meet greater than or equal to 90% of their needs  MONITOR:   PO intake, Supplement acceptance, Weight trends  REASON FOR ASSESSMENT:   Malnutrition Screening Tool    ASSESSMENT:   82 y.o. male PMH significant for PE on Eliquis, metastatic bladder cancer, multiple myeloma who presented for decreased appetite, confusion, and failure to thrive. Admitted for hyperkalemia.  Patient reports a UBW of 246# and that he has lost a lot of weight over the past few months as a result of not eating well. Per EMR, patient weighed at 208# in April and weighed this admission at 160#. This is a 48# or 23% weight loss in 3 months, which is significant and severe for the time frame.   He endorses poor appetite for several weeks and that he has been having trouble with swallowing as well. States he has only been consuming bites of food here and there. Drinks Ensure a couple times a week but not daily.   Current appetite improved. Also reports swallowing seems to be a little better. Documented to have consumed 25-65% of meals today. Patient was ordered a renal diet, regular texture. Reached out to MD to discuss patient's mention of swallowing issues. Per chart review, last seen by SLP in June and recommended DYS 1 diet. MD now changed diet to DYS 1.  Patient ordered Nepro with ONS protocol while on renal diet. He would prefer to have Ensure, will adjust order.    Medications reviewed and include: -  Labs  reviewed:  -   NUTRITION - FOCUSED PHYSICAL EXAM:  Flowsheet Row Most Recent Value  Orbital Region Mild depletion  Upper Arm Region Moderate depletion  Thoracic and Lumbar Region No depletion  Buccal Region Mild depletion  Temple Region No depletion  Clavicle Bone Region Moderate depletion  Clavicle and Acromion Bone Region Mild depletion  Scapular Bone Region Unable to assess  Dorsal Hand Mild depletion  Patellar Region Mild depletion  Anterior Thigh Region Mild depletion  Posterior Calf Region Moderate depletion  Edema (RD Assessment) None  Hair Reviewed  Eyes Reviewed  Mouth Reviewed  Skin Reviewed  Nails Reviewed       Diet Order:   Diet Order             DIET - DYS 1 Room service appropriate? Yes; Fluid consistency: Thin  Diet effective now                   EDUCATION NEEDS:  Education needs have been addressed  Skin:  Skin Assessment: Skin Integrity Issues: Skin Integrity Issues:: Unstageable Unstageable: Right heel  Last BM:  7/12  Height:  Ht Readings from Last 1 Encounters:  09/09/22 5\' 9"  (1.753 m)   Weight:  Wt Readings from Last 1 Encounters:  09/10/22 76.1 kg    BMI:  Body mass index is 24.78 kg/m.  Estimated Nutritional Needs:  Kcal:  1900-2100 kcals Protein:  95-110 grams Fluid:  >/= 1.9L  Shelle Iron RD, LDN For contact information, refer to [facility].

## 2022-09-10 NOTE — Evaluation (Signed)
Physical Therapy Evaluation Patient Details Name: Frank Hernandez MRN: [id number] DOB: 12/22/1940 Today's Date: 09/10/2022  History of Present Illness  Frank Hernandez is an 82 y.o. male past medical history significant for PE on Eliquis metastatic bladder cancer, multiple myeloma sent in from the nursing home 09/08/22 for decreased appetite confusion and failure to thrive that started 2 to 3 days prior to admission.  Workup in the ED revealed a potassium of 7.4.  Clinical Impression  Pt admitted with above diagnosis.  Pt currently with functional limitations due to the deficits listed below (see PT Problem List). Pt will benefit from acute skilled PT to increase their independence and safety with mobility to allow discharge.     The patient  participated in  bed mobility, wanting not assistance. Patient mobilized to sitting with min assist.   Patient stood with +2 max at RW briefly, trunk flexed, knees flexed which lack full knee extension/ Patient appreciative of sitting  up on the side of the bed. Patient will benefit from continued inpatient follow up therapy, <3 hours/day      Assistance Recommended at Discharge Frequent or constant Supervision/Assistance  If plan is discharge home, recommend the following:  Can travel by private vehicle  Two people to help with walking and/or transfers;A lot of help with bathing/dressing/bathroom   No    Equipment Recommendations None recommended by PT  Recommendations for Other Services       Functional Status Assessment Patient has had a recent decline in their functional status and demonstrates the ability to make significant improvements in function in a reasonable and predictable amount of time.     Precautions / Restrictions        Mobility  Bed Mobility Overal bed mobility: Needs Assistance Bed Mobility: Supine to Sit     Supine to sit: Min assist     General bed mobility comments: patient  wanted to perform with no  assistnace, min assist at trunk once legs over bed edge, required max to place legs back onto bed.    Transfers Overall transfer level: Needs assistance Equipment used: Rolling walker (2 wheels) Transfers: Sit to/from Stand Sit to Stand: Max assist, +2 safety/equipment, +2 physical assistance, From elevated surface           General transfer comment: stood  a few seconds a RW with max to power up, trunk flexed, knees flexed    Ambulation/Gait                  Stairs            Wheelchair Mobility     Tilt Bed    Modified Rankin (Stroke Patients Only)       Balance Overall balance assessment: Needs assistance Sitting-balance support: No upper extremity supported, Feet supported Sitting balance-Leahy Scale: Fair     Standing balance support: During functional activity, Bilateral upper extremity supported, Reliant on assistive device for balance Standing balance-Leahy Scale: Poor                               Pertinent Vitals/Pain Pain Assessment Pain Assessment: Faces Faces Pain Scale: Hurts even more Pain Location: both feet when touched Pain Descriptors / Indicators: Discomfort, Guarding Pain Intervention(s): Monitored during session, Repositioned    Home Living Family/patient expects to be discharged to:: Skilled nursing facility  Prior Function               Mobility Comments: wife reports that the patient  was ambulating a short distance with PT, chair followed, progbably last time 7/8.       Hand Dominance        Extremity/Trunk Assessment   Upper Extremity Assessment Upper Extremity Assessment: Generalized weakness (limited shoulder flexion.)    Lower Extremity Assessment Lower Extremity Assessment: RLE deficits/detail;LLE deficits/detail RLE Deficits / Details: flexor reflexes when touched, has heel patches in place and prevalon boots, knee flex contractures LLE Deficits /  Details: similar to right, tends to cross feet, tight adductors    Cervical / Trunk Assessment Cervical / Trunk Assessment: Kyphotic  Communication      Cognition Arousal/Alertness: Awake/alert Behavior During Therapy: WFL for tasks assessed/performed Overall Cognitive Status: History of cognitive impairments - at baseline                                 General Comments: oriented to self, and wife, able to follow simple directions.        General Comments      Exercises     Assessment/Plan    PT Assessment Patient needs continued PT services  PT Problem List Decreased strength;Decreased mobility;Decreased safety awareness;Decreased range of motion;Decreased activity tolerance;Decreased cognition;Decreased balance;Pain       PT Treatment Interventions DME instruction;Therapeutic activities;Cognitive remediation;Therapeutic exercise;Patient/family education;Gait training;Functional mobility training    PT Goals (Current goals can be found in the Care Plan section)  Acute Rehab PT Goals Patient Stated Goal: walkj PT Goal Formulation: With patient/family Time For Goal Achievement: 09/24/22 Potential to Achieve Goals: Fair    Frequency Min 1X/week     Co-evaluation               AM-PAC PT "6 Clicks" Mobility  Outcome Measure Help needed turning from your back to your side while in a flat bed without using bedrails?: A Lot Help needed moving from lying on your back to sitting on the side of a flat bed without using bedrails?: A Little Help needed moving to and from a bed to a chair (including a wheelchair)?: A Lot Help needed standing up from a chair using your arms (e.g., wheelchair or bedside chair)?: Total Help needed to walk in hospital room?: Total Help needed climbing 3-5 steps with a railing? : Total 6 Click Score: 10    End of Session Equipment Utilized During Treatment: Gait belt Activity Tolerance: Patient tolerated treatment  well Patient left: in bed;with call bell/phone within reach;with bed alarm set;with family/visitor present Nurse Communication: Mobility status PT Visit Diagnosis: Unsteadiness on feet (R26.81);Pain Pain - Right/Left: Left Pain - part of body: Ankle and joints of foot    Time: 1610-9604 PT Time Calculation (min) (ACUTE ONLY): 26 min   Charges:   PT Evaluation $PT Eval Low Complexity: 1 Low PT Treatments $Therapeutic Activity: 8-22 mins PT General Charges $$ ACUTE PT VISIT: 1 Visit         Blanchard Kelch PT Acute Rehabilitation Services Office [phone] Weekend [phone]   Rada Hay 09/10/2022, 3:51 PM

## 2022-09-10 NOTE — Progress Notes (Signed)
Notified by Delorise Shiner, NT that pt's CBG 68. Hypoglycemia standing orders entered and pt given 8oz of apple juice. Recheck CBG 89.

## 2022-09-11 ENCOUNTER — Encounter (HOSPITAL_COMMUNITY): Payer: Self-pay | Admitting: Internal Medicine

## 2022-09-11 DIAGNOSIS — R410 Disorientation, unspecified: Secondary | ICD-10-CM

## 2022-09-11 DIAGNOSIS — E875 Hyperkalemia: Secondary | ICD-10-CM | POA: Diagnosis not present

## 2022-09-11 DIAGNOSIS — E43 Unspecified severe protein-calorie malnutrition: Secondary | ICD-10-CM | POA: Insufficient documentation

## 2022-09-11 DIAGNOSIS — N179 Acute kidney failure, unspecified: Secondary | ICD-10-CM | POA: Diagnosis not present

## 2022-09-11 LAB — BASIC METABOLIC PANEL
Anion gap: 6 (ref 5–15)
BUN: 14 mg/dL (ref 8–23)
CO2: 13 mmol/L — ABNORMAL LOW (ref 22–32)
Calcium: 7.6 mg/dL — ABNORMAL LOW (ref 8.9–10.3)
Chloride: 118 mmol/L — ABNORMAL HIGH (ref 98–111)
Creatinine, Ser: 1.04 mg/dL (ref 0.61–1.24)
GFR, Estimated: >60 mL/min (ref >60)
Glucose, Bld: 83 mg/dL (ref 70–99)
Potassium: 3.7 mmol/L (ref 3.5–5.1)
Sodium: 137 mmol/L (ref 135–145)

## 2022-09-11 MED ORDER — SODIUM BICARBONATE 650 MG PO TABS
1300.0000 mg | ORAL_TABLET | Freq: Three times a day (TID) | ORAL | Status: AC
  Administered 2022-09-11 (×2): 1300 mg via ORAL
  Filled 2022-09-11 (×2): qty 2

## 2022-09-11 MED ORDER — SODIUM BICARBONATE 8.4 % IV SOLN
INTRAVENOUS | Status: DC
  Filled 2022-09-11 (×2): qty 150

## 2022-09-11 NOTE — Progress Notes (Signed)
[facility] PROGRESS NOTE    Progress Note  Frank Hernandez  EAV:[id number] DOB: December 01, 1940 DOA: 09/08/2022 PCP: [name] (Inactive)     Brief Narrative:   Frank Hernandez is an 82 y.o. male past medical history significant for PE on Eliquis metastatic bladder cancer, multiple myeloma sent in from the nursing home for decreased appetite confusion and failure to thrive that started 2 to 3 days prior to admission.  Workup in the ED revealed a potassium of 7.4.  She was treated in the emergency room with insulin D50 bicarbonate Lasix IV fluids and Lokelma.  Assessment/Plan:   Hyperkalemia/normal anion gap metabolic acidosis: Multifactorial in the setting of acute kidney injury, dehydration,acidosis and potassium supplementation Now resolved, basic metabolic panels pending this morning Further management per nephrology.  Acute metabolic encephalopathy: Likely due to to acute kidney injury and dehydration. 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 Now resolved.  Acute kidney injury superimposed on   Chronic kidney disease, stage 3a (HCC): With a baseline creatinine of around 1 on admission 1.3.   Likely prerenal azotemia Resolved. He is tolerating his diet KVO IV fluids.  Essential hypertension: Currently on no antihypertensive medication.  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 PT evaluated the patient, will need skilled nursing facility placement.  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 (Active)  03/29/22 2140  Location: Buttocks  Location Orientation: Right  Staging: Stage 1 -   Intact skin with non-blanchable redness of a localized area usually over a bony prominence.  Wound Description (Comments):   Present on Admission: Yes     Pressure Injury 05/28/22 Heel Right Unstageable - Full thickness tissue loss in which the base of the injury is covered by slough (yellow, tan, gray, green or brown) and/or eschar (tan, brown or black) in the wound bed. Open wound, broken from pressure & (Active)  05/28/22 1400  Location: Heel  Location Orientation: Right  Staging: Unstageable - Full thickness tissue loss in which the base of the injury is covered by slough (yellow, tan, gray, green or brown) and/or eschar (tan, brown or black) in the wound bed.  Wound Description (Comments): Open wound, broken from pressure & dry skin  Present on Admission: Yes  Dressing Type Foam - Lift dressing to assess site every shift 09/10/22 2215     Pressure Injury 08/08/22 Buttocks Right;Left Deep Tissue Pressure Injury - Purple or maroon localized area of discolored intact skin or blood-filled blister due to damage of underlying soft tissue from pressure and/or shear. (Active)  08/08/22 1241  Location: Buttocks  Location Orientation: Right;Left  Staging: Deep Tissue Pressure Injury - Purple or maroon localized area of discolored intact skin or blood-filled blister due to damage of underlying soft tissue from pressure and/or shear.  Wound Description (Comments):   Present on Admission:     DVT prophylaxis: eliquis Family Communication:none Status is: Inpatient Remains inpatient appropriate because: Acute kidney injury hyperkalemia metabolic acidosis    Code Status:     Code Status Orders  (From admission, onward)           Start     Ordered   09/08/22 1933  Full code  Continuous  Question:  By:  Answer:  Consent: discussion documented in EHR   09/08/22 1935           Code Status History     Date Active Date Inactive Code Status Order ID Comments User Context    07/29/2022 1204 08/16/2022 1628 Full Code [id number]  Bobette Mo, MD ED   05/28/2022 1110 06/01/2022 2315 Full Code [id number]  Maryln Gottron, MD ED   04/13/2022 1309 04/23/2022 0203 Full Code [id number]  Elease Etienne, MD ED   03/30/2022 0130 04/05/2022 2058 Full Code [id number]  Gery Pray, MD ED   06/10/2020 2243 06/25/2020 0330 Full Code [id number]  Kinsinger, De Blanch, MD ED   12/26/2015 1513 12/27/2015 2120 Full Code [id number]  Black, Lesle Chris, NP ED         IV Access:   Peripheral IV   Procedures and diagnostic studies:   No results found.   Medical Consultants:   None.   Subjective:    Frank Hernandez no complaints  Objective:    Vitals:   09/10/22 2008 09/11/22 0026 09/11/22 0419 09/11/22 0500  BP: 120/78 (!) 120/57 122/70   Pulse: 82 72 67   Resp: 18 18 18    Temp: 98.9 F (37.2 C) 98.7 F (37.1 C) 98.4 F (36.9 C)   TempSrc: Oral Oral Oral   SpO2: 100% 100% 100%   Weight:    79 kg  Height:       SpO2: 100 %   Intake/Output Summary (Last 24 hours) at 09/11/2022 0907 Last data filed at 09/11/2022 0700 Gross per 24 hour  Intake 460 ml  Output 451 ml  Net 9 ml   Filed Weights   09/10/22 0400 09/10/22 0504 09/11/22 0500  Weight: 76.1 kg 76.1 kg 79 kg    Exam: General exam: In no acute distress. Respiratory system: Good air movement and clear to auscultation. Cardiovascular system: S1 & S2 heard, RRR. No JVD. Gastrointestinal system: Abdomen is nondistended, soft and nontender.  Extremities: No pedal edema. Skin: No rashes, lesions or ulcers Psychiatry: Judgement and insight appear normal. Mood & affect appropriate.  Data Reviewed:    Labs: Basic Metabolic Panel: Recent Labs  Lab 09/08/22 1252 09/08/22 1616 09/08/22 2213 09/09/22 0303 09/09/22 0956 09/09/22 1716 09/09/22 2146 09/10/22 0303  NA 136  --   --  140  --  137  --  142  K >7.5* >7.5*   < > 6.1*   < > 4.5 4.2 3.9  CL 120*  --   --  124*  --  122*  --  122*   CO2 8*  --   --  11*  --  12*  --  13*  GLUCOSE 94 71  --  90  --  97  --  74  BUN 25*  --   --  23  --  19  --  15  CREATININE 1.32*  --   --  1.27*  --  1.32*  --  1.07  CALCIUM 9.2  --   --  8.7*  --  8.0*  --  8.0*  MG 1.8  --   --   --   --   --   --   --   PHOS  --  2.2*  --   --   --   --   --   --    < > = values in this interval not displayed.   GFR Estimated Creatinine  Clearance: 54.1 mL/min (by C-G formula based on SCr of 1.07 mg/dL). Liver Function Tests: Recent Labs  Lab 09/08/22 1252  AST 35  ALT 20  ALKPHOS 145*  BILITOT 0.5  PROT 6.9  ALBUMIN 3.8   No results for input(s): "LIPASE", "AMYLASE" in the last 168 hours. No results for input(s): "AMMONIA" in the last 168 hours. Coagulation profile No results for input(s): "INR", "PROTIME" in the last 168 hours. COVID-19 Labs  Recent Labs    09/08/22 1616  LDH 216*    Lab Results  Component Value Date   SARSCOV2NAA NEGATIVE 07/29/2022   SARSCOV2NAA NEGATIVE 03/23/2022   SARSCOV2NAA NEGATIVE 06/21/2020   SARSCOV2NAA POSITIVE (A) 06/20/2020    CBC: Recent Labs  Lab 09/08/22 1252 09/09/22 0303  WBC 9.6 8.6  NEUTROABS 7.6  --   HGB 10.5* 9.3*  HCT 33.7* 29.7*  MCV 96.3 95.8  PLT 388 331   Cardiac Enzymes: No results for input(s): "CKTOTAL", "CKMB", "CKMBINDEX", "TROPONINI" in the last 168 hours. BNP (last 3 results) No results for input(s): "PROBNP" in the last 8760 hours. CBG: Recent Labs  Lab 09/09/22 0650 09/09/22 0805 09/10/22 0802 09/10/22 0834 09/10/22 2005  GLUCAP 121* 106* 68* 89 96   D-Dimer: No results for input(s): "DDIMER" in the last 72 hours. Hgb A1c: No results for input(s): "HGBA1C" in the last 72 hours. Lipid Profile: No results for input(s): "CHOL", "HDL", "LDLCALC", "TRIG", "CHOLHDL", "LDLDIRECT" in the last 72 hours. Thyroid function studies: No results for input(s): "TSH", "T4TOTAL", "T3FREE", "THYROIDAB" in the last 72 hours.  Invalid input(s): "FREET3" Anemia  work up: No results for input(s): "VITAMINB12", "FOLATE", "FERRITIN", "TIBC", "IRON", "RETICCTPCT" in the last 72 hours. Sepsis Labs: Recent Labs  Lab 09/08/22 1252 09/09/22 0303  WBC 9.6 8.6   Microbiology Recent Results (from the past 240 hour(s))  MRSA Next Gen by PCR, Nasal     Status: None   Collection Time: 09/09/22  2:36 AM   Specimen: Nasal Mucosa; Nasal Swab  Result Value Ref Range Status   MRSA by PCR Next Gen NOT DETECTED NOT DETECTED Final    Comment: (NOTE) The GeneXpert MRSA Assay (FDA approved for NASAL specimens only), is one component of a comprehensive MRSA colonization surveillance program. It is not intended to diagnose MRSA infection nor to guide or monitor treatment for MRSA infections. Test performance is not FDA approved in patients less than 67 years old. Performed at [facility], [address] [place], [place] [address]      Medications:    apixaban  5 mg Oral BID   Chlorhexidine Gluconate Cloth  6 each Topical Daily   feeding supplement  237 mL Oral TID BM   mouth rinse  15 mL Mouth Rinse 4 times per day   pantoprazole  40 mg Oral BID   Continuous Infusions:      LOS: 3 days   Marinda Elk  [facility]  09/11/2022, 9:07 AM

## 2022-09-11 NOTE — Plan of Care (Signed)
  Problem: Skin Integrity: Goal: Risk for impaired skin integrity will decrease Outcome: Progressing   Problem: Safety: Goal: Ability to remain free from injury will improve Outcome: Progressing   Problem: Coping: Goal: Level of anxiety will decrease Outcome: Progressing   Problem: Elimination: Goal: Will not experience complications related to bowel motility Outcome: Progressing

## 2022-09-12 DIAGNOSIS — R410 Disorientation, unspecified: Secondary | ICD-10-CM | POA: Diagnosis not present

## 2022-09-12 DIAGNOSIS — N179 Acute kidney failure, unspecified: Secondary | ICD-10-CM | POA: Diagnosis not present

## 2022-09-12 DIAGNOSIS — E875 Hyperkalemia: Secondary | ICD-10-CM | POA: Diagnosis not present

## 2022-09-12 LAB — BASIC METABOLIC PANEL
Anion gap: 7 (ref 5–15)
BUN: 14 mg/dL (ref 8–23)
CO2: 19 mmol/L — ABNORMAL LOW (ref 22–32)
Calcium: 7.4 mg/dL — ABNORMAL LOW (ref 8.9–10.3)
Chloride: 116 mmol/L — ABNORMAL HIGH (ref 98–111)
Creatinine, Ser: 1.08 mg/dL (ref 0.61–1.24)
GFR, Estimated: >60 mL/min (ref >60)
Glucose, Bld: 85 mg/dL (ref 70–99)
Potassium: 3.2 mmol/L — ABNORMAL LOW (ref 3.5–5.1)
Sodium: 142 mmol/L (ref 135–145)

## 2022-09-12 LAB — GLUCOSE, CAPILLARY: Glucose-Capillary: 105 mg/dL — ABNORMAL HIGH (ref 70–99)

## 2022-09-12 MED ORDER — SODIUM BICARBONATE 650 MG PO TABS
1300.0000 mg | ORAL_TABLET | Freq: Three times a day (TID) | ORAL | Status: AC
  Administered 2022-09-12 (×2): 1300 mg via ORAL
  Filled 2022-09-12 (×2): qty 2

## 2022-09-12 MED ORDER — POTASSIUM CHLORIDE CRYS ER 20 MEQ PO TBCR
30.0000 meq | EXTENDED_RELEASE_TABLET | Freq: Two times a day (BID) | ORAL | Status: AC
  Administered 2022-09-12 (×2): 30 meq via ORAL
  Filled 2022-09-12 (×2): qty 1

## 2022-09-12 NOTE — Plan of Care (Signed)
  Problem: Clinical Measurements: Goal: Diagnostic test results will improve Outcome: Progressing Goal: Respiratory complications will improve Outcome: Progressing Goal: Cardiovascular complication will be avoided Outcome: Progressing   Problem: Coping: Goal: Level of anxiety will decrease Outcome: Progressing   Problem: Elimination: Goal: Will not experience complications related to bowel motility Outcome: Progressing   

## 2022-09-12 NOTE — Progress Notes (Addendum)
[facility] PROGRESS NOTE    Progress Note  Frank Hernandez  ZOX:[id number] DOB: 05-04-1940 DOA: 09/08/2022 PCP: [name] (Inactive)     Brief Narrative:   Frank Hernandez is an 82 y.o. male past medical history significant for PE on Eliquis metastatic bladder cancer, multiple myeloma sent in from the nursing home for decreased appetite confusion and failure to thrive that started 2 to 3 days prior to admission.  Workup in the ED revealed a potassium of 7.4.  She was treated in the emergency room with insulin D50 bicarbonate Lasix IV fluids and Lokelma.  Assessment/Plan:   Hyperkalemia/normal anion gap metabolic acidosis: Multifactorial in the setting of acute kidney injury, dehydration,acidosis and potassium supplementation K now low, replete potassium orally recheck in the morning.  Acute metabolic encephalopathy: Likely due to to acute kidney injury and dehydration. 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 Now resolved.  Acute kidney injury superimposed on   Chronic kidney disease, stage 3a (HCC): With a baseline creatinine of around 1 on admission 1.3.   Likely prerenal azotemia Resolved. He is tolerating his diet KVO IV fluids.  Essential hypertension: Currently on no antihypertensive medication.  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 PT evaluated the patient, will need skilled nursing facility placement.  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 (Active)  03/29/22 2140  Location: Buttocks  Location Orientation: Right  Staging: Stage 1 -  Intact skin with non-blanchable redness  of a localized area usually over a bony prominence.  Wound Description (Comments):   Present on Admission: Yes     Pressure Injury 05/28/22 Heel Right Unstageable - Full thickness tissue loss in which the base of the injury is covered by slough (yellow, tan, gray, green or brown) and/or eschar (tan, brown or black) in the wound bed. Open wound, broken from pressure & (Active)  05/28/22 1400  Location: Heel  Location Orientation: Right  Staging: Unstageable - Full thickness tissue loss in which the base of the injury is covered by slough (yellow, tan, gray, green or brown) and/or eschar (tan, brown or black) in the wound bed.  Wound Description (Comments): Open wound, broken from pressure & dry skin  Present on Admission: Yes  Dressing Type Foam - Lift dressing to assess site every shift 09/11/22 2300     Pressure Injury 08/08/22 Buttocks Right;Left Deep Tissue Pressure Injury - Purple or maroon localized area of discolored intact skin or blood-filled blister due to damage of underlying soft tissue from pressure and/or shear. (Active)  08/08/22 1241  Location: Buttocks  Location Orientation: Right;Left  Staging: Deep Tissue Pressure Injury - Purple or maroon localized area of discolored intact skin or blood-filled blister due to damage of underlying soft tissue from pressure and/or shear.  Wound Description (Comments):   Present on Admission:     DVT prophylaxis: Eliquis Family Communication:none Status is: Inpatient Remains inpatient appropriate because: Acute kidney injury hyperkalemia metabolic acidosis    Code Status:     Code Status Orders  (From admission, onward)           Start     Ordered   09/08/22 1933  Full code  Continuous  Question:  By:  Answer:  Consent: discussion documented in EHR   09/08/22 1935           Code Status History     Date Active Date Inactive Code Status Order ID Comments User Context   07/29/2022 1204 08/16/2022 1628 Full Code  [id number]  Bobette Mo, MD ED   05/28/2022 1110 06/01/2022 2315 Full Code [id number]  Maryln Gottron, MD ED   04/13/2022 1309 04/23/2022 0203 Full Code [id number]  Elease Etienne, MD ED   03/30/2022 0130 04/05/2022 2058 Full Code [id number]  Gery Pray, MD ED   06/10/2020 2243 06/25/2020 0330 Full Code [id number]  Kinsinger, De Blanch, MD ED   12/26/2015 1513 12/27/2015 2120 Full Code [id number]  Black, Lesle Chris, NP ED         IV Access:   Peripheral IV   Procedures and diagnostic studies:   No results found.   Medical Consultants:   None.   Subjective:    Frank Hernandez no complaints  Objective:    Vitals:   09/11/22 1508 09/11/22 1949 09/12/22 0424 09/12/22 0500  BP: (!) 124/59 126/61 129/71   Pulse: 69 70 69   Resp: 16 18 18    Temp:  98 F (36.7 C) 98.3 F (36.8 C)   TempSrc:  Oral Oral   SpO2: 100% 100% 99%   Weight:    81.7 kg  Height:       SpO2: 99 %   Intake/Output Summary (Last 24 hours) at 09/12/2022 0920 Last data filed at 09/12/2022 7564 Gross per 24 hour  Intake 1912.33 ml  Output 703 ml  Net 1209.33 ml   Filed Weights   09/10/22 0504 09/11/22 0500 09/12/22 0500  Weight: 76.1 kg 79 kg 81.7 kg    Exam: General exam: In no acute distress. Respiratory system: Good air movement and clear to auscultation. Cardiovascular system: S1 & S2 heard, RRR. No JVD. Gastrointestinal system: Abdomen is nondistended, soft and nontender.  Extremities: No pedal edema. Skin: No rashes, lesions or ulcers  Data Reviewed:    Labs: Basic Metabolic Panel: Recent Labs  Lab 09/08/22 1252 09/08/22 1616 09/08/22 2213 09/09/22 0303 09/09/22 0956 09/09/22 1716 09/09/22 2146 09/10/22 0303 09/11/22 0921 09/12/22 0510  NA 136  --   --  140  --  137  --  142 137 142  K >7.5* >7.5*   < > 6.1*   < > 4.5   < > 3.9 3.7 3.2*  CL 120*  --   --  124*  --  122*  --  122* 118* 116*  CO2 8*  --   --  11*  --  12*  --  13* 13* 19*  GLUCOSE 94 71  --  90   --  97  --  74 83 85  BUN 25*  --   --  23  --  19  --  15 14 14   CREATININE 1.32*  --   --  1.27*  --  1.32*  --  1.07 1.04 1.08  CALCIUM 9.2  --   --  8.7*  --  8.0*  --  8.0* 7.6* 7.4*  MG 1.8  --   --   --   --   --   --   --   --   --   PHOS  --  2.2*  --   --   --   --   --   --   --   --    < > =  values in this interval not displayed.   GFR Estimated Creatinine Clearance: 53.6 mL/min (by C-G formula based on SCr of 1.08 mg/dL). Liver Function Tests: Recent Labs  Lab 09/08/22 1252  AST 35  ALT 20  ALKPHOS 145*  BILITOT 0.5  PROT 6.9  ALBUMIN 3.8   No results for input(s): "LIPASE", "AMYLASE" in the last 168 hours. No results for input(s): "AMMONIA" in the last 168 hours. Coagulation profile No results for input(s): "INR", "PROTIME" in the last 168 hours. COVID-19 Labs  No results for input(s): "DDIMER", "FERRITIN", "LDH", "CRP" in the last 72 hours.   Lab Results  Component Value Date   SARSCOV2NAA NEGATIVE 07/29/2022   SARSCOV2NAA NEGATIVE 03/23/2022   SARSCOV2NAA NEGATIVE 06/21/2020   SARSCOV2NAA POSITIVE (A) 06/20/2020    CBC: Recent Labs  Lab 09/08/22 1252 09/09/22 0303  WBC 9.6 8.6  NEUTROABS 7.6  --   HGB 10.5* 9.3*  HCT 33.7* 29.7*  MCV 96.3 95.8  PLT 388 331   Cardiac Enzymes: No results for input(s): "CKTOTAL", "CKMB", "CKMBINDEX", "TROPONINI" in the last 168 hours. BNP (last 3 results) No results for input(s): "PROBNP" in the last 8760 hours. CBG: Recent Labs  Lab 09/09/22 0805 09/10/22 0802 09/10/22 0834 09/10/22 2005 09/12/22 0745  GLUCAP 106* 68* 89 96 105*   D-Dimer: No results for input(s): "DDIMER" in the last 72 hours. Hgb A1c: No results for input(s): "HGBA1C" in the last 72 hours. Lipid Profile: No results for input(s): "CHOL", "HDL", "LDLCALC", "TRIG", "CHOLHDL", "LDLDIRECT" in the last 72 hours. Thyroid function studies: No results for input(s): "TSH", "T4TOTAL", "T3FREE", "THYROIDAB" in the last 72 hours.  Invalid  input(s): "FREET3" Anemia work up: No results for input(s): "VITAMINB12", "FOLATE", "FERRITIN", "TIBC", "IRON", "RETICCTPCT" in the last 72 hours. Sepsis Labs: Recent Labs  Lab 09/08/22 1252 09/09/22 0303  WBC 9.6 8.6   Microbiology Recent Results (from the past 240 hour(s))  MRSA Next Gen by PCR, Nasal     Status: None   Collection Time: 09/09/22  2:36 AM   Specimen: Nasal Mucosa; Nasal Swab  Result Value Ref Range Status   MRSA by PCR Next Gen NOT DETECTED NOT DETECTED Final    Comment: (NOTE) The GeneXpert MRSA Assay (FDA approved for NASAL specimens only), is one component of a comprehensive MRSA colonization surveillance program. It is not intended to diagnose MRSA infection nor to guide or monitor treatment for MRSA infections. Test performance is not FDA approved in patients less than 6 years old. Performed at [facility], [address], [place], [place] [address]      Medications:    apixaban  5 mg Oral BID   Chlorhexidine Gluconate Cloth  6 each Topical Daily   feeding supplement  237 mL Oral TID BM   mouth rinse  15 mL Mouth Rinse 4 times per day   pantoprazole  40 mg Oral BID   potassium chloride  30 mEq Oral BID   Continuous Infusions:      LOS: 4 days   Marinda Elk  [facility]  09/12/2022, 9:20 AM

## 2022-09-12 NOTE — Plan of Care (Signed)
  Problem: Education: Goal: Knowledge of General Education information will improve Description: Including pain rating scale, medication(s)/side effects and non-pharmacologic comfort measures Outcome: Progressing   Problem: Health Behavior/Discharge Planning: Goal: Ability to manage health-related needs will improve Outcome: Progressing   Problem: Safety: Goal: Ability to remain free from injury will improve Outcome: Progressing   Problem: Skin Integrity: Goal: Risk for impaired skin integrity will decrease Outcome: Progressing   Problem: Nutrition: Goal: Adequate nutrition will be maintained Outcome: Not Progressing Note: Pt does not enjoy texture of dysphagia diet and requires encouragement with it. He does however, drink his boost shakes with each meal.

## 2022-09-13 DIAGNOSIS — E875 Hyperkalemia: Secondary | ICD-10-CM | POA: Diagnosis not present

## 2022-09-13 LAB — BASIC METABOLIC PANEL
Anion gap: 6 (ref 5–15)
BUN: 13 mg/dL (ref 8–23)
CO2: 20 mmol/L — ABNORMAL LOW (ref 22–32)
Calcium: 7.4 mg/dL — ABNORMAL LOW (ref 8.9–10.3)
Chloride: 115 mmol/L — ABNORMAL HIGH (ref 98–111)
Creatinine, Ser: 0.94 mg/dL (ref 0.61–1.24)
GFR, Estimated: >60 mL/min (ref >60)
Glucose, Bld: 85 mg/dL (ref 70–99)
Potassium: 3.9 mmol/L (ref 3.5–5.1)
Sodium: 141 mmol/L (ref 135–145)

## 2022-09-13 LAB — GLUCOSE, CAPILLARY
Glucose-Capillary: 92 mg/dL (ref 70–99)
Glucose-Capillary: 111 mg/dL — ABNORMAL HIGH (ref 70–99)
Glucose-Capillary: 101 mg/dL — ABNORMAL HIGH (ref 70–99)
Glucose-Capillary: 121 mg/dL — ABNORMAL HIGH (ref 70–99)

## 2022-09-13 NOTE — Progress Notes (Addendum)
[facility] PROGRESS NOTE    Progress Note  Frank Hernandez  ZOX:[id number] DOB: Feb 25, 1941 DOA: 09/08/2022 PCP: [name] (Inactive)     Brief Narrative:   Frank Hernandez is an 82 y.o. male past medical history significant for PE on Eliquis metastatic bladder cancer, multiple myeloma sent in from the nursing home for decreased appetite confusion and failure to thrive that started 2 to 3 days prior to admission.  Workup in the ED revealed a potassium of 7.4.  She was treated in the emergency room with insulin D50 bicarbonate Lasix IV fluids and Lokelma.  Assessment/Plan:   Hyperkalemia/normal anion gap metabolic acidosis: Multifactorial in the setting of acute kidney injury, dehydration,acidosis and potassium supplementation K now normalized  Acute metabolic encephalopathy: Likely due to to acute kidney injury and dehydration. 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 Now resolved.  Acute kidney injury superimposed on   Chronic kidney disease, stage 3a (HCC): With a baseline creatinine of around 1 on admission 1.3.   Likely prerenal azotemia Resolved. He is tolerating his diet KVO IV fluids.  Essential hypertension: BP well controlled Currently on no antihypertensive medication.  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 PT evaluated the patient, will need skilled nursing facility placement. 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 (Active)  03/29/22 2140  Location: Buttocks  Location Orientation: Right  Staging: Stage 1  -  Intact skin with non-blanchable redness of a localized area usually over a bony prominence.  Wound Description (Comments):   Present on Admission: Yes     Pressure Injury 05/28/22 Heel Right Unstageable - Full thickness tissue loss in which the base of the injury is covered by slough (yellow, tan, gray, green or brown) and/or eschar (tan, brown or black) in the wound bed. Open wound, broken from pressure & (Active)  05/28/22 1400  Location: Heel  Location Orientation: Right  Staging: Unstageable - Full thickness tissue loss in which the base of the injury is covered by slough (yellow, tan, gray, green or brown) and/or eschar (tan, brown or black) in the wound bed.  Wound Description (Comments): Open wound, broken from pressure & dry skin  Present on Admission: Yes  Dressing Type Foam - Lift dressing to assess site every shift 09/12/22 2000     Pressure Injury 08/08/22 Buttocks Right;Left Deep Tissue Pressure Injury - Purple or maroon localized area of discolored intact skin or blood-filled blister due to damage of underlying soft tissue from pressure and/or shear. (Active)  08/08/22 1241  Location: Buttocks  Location Orientation: Right;Left  Staging: Deep Tissue Pressure Injury - Purple or maroon localized area of discolored intact skin or blood-filled blister due to damage of underlying soft tissue from pressure and/or shear.  Wound Description (Comments):   Present on Admission:     DVT prophylaxis: Eliquis Family Communication: wife updated telephonically 7/15 Status is: Inpatient Remains inpatient appropriate because: awaiting insurance auth for SNF    Code Status:     Code Status Orders  (From admission, onward)           Start     Ordered   09/08/22 1933  Full code  Continuous       Question:  By:  Answer:  Consent: discussion documented in EHR   09/08/22 1935           Code Status History     Date Active Date Inactive Code Status Order ID Comments User  Context   07/29/2022 1204 08/16/2022 1628 Full Code [id number]  Bobette Mo, MD ED   05/28/2022 1110 06/01/2022 2315 Full Code [id number]  Maryln Gottron, MD ED   04/13/2022 1309 04/23/2022 0203 Full Code [id number]  Elease Etienne, MD ED   03/30/2022 0130 04/05/2022 2058 Full Code [id number]  Gery Pray, MD ED   06/10/2020 2243 06/25/2020 0330 Full Code [id number]  Kinsinger, De Blanch, MD ED   12/26/2015 1513 12/27/2015 2120 Full Code [id number]  Black, Lesle Chris, NP ED         IV Access:   Peripheral IV   Procedures and diagnostic studies:   No results found.   Medical Consultants:   None.   Subjective:    [name] no complaints. Denies pain. Tolerating diet.   Objective:    Vitals:   09/12/22 1416 09/12/22 2010 09/13/22 0436 09/13/22 0700  BP: 129/63 139/86 (!) 140/63   Pulse: 65 79 66   Resp: 18 18 18    Temp: 98.5 F (36.9 C) 98.4 F (36.9 C) 99.9 F (37.7 C)   TempSrc: Oral Oral Oral   SpO2: 100% 100% 100%   Weight:    81.1 kg  Height:       SpO2: 100 %   Intake/Output Summary (Last 24 hours) at 09/13/2022 0902 Last data filed at 09/13/2022 0800 Gross per 24 hour  Intake 840 ml  Output 350 ml  Net 490 ml   Filed Weights   09/11/22 0500 09/12/22 0500 09/13/22 0700  Weight: 79 kg 81.7 kg 81.1 kg    Exam: General exam: In no acute distress. Respiratory system: Good air movement and clear to auscultation. Cardiovascular system: S1 & S2 heard, RRR. No JVD. Gastrointestinal system: Abdomen is nondistended, soft and nontender.  Extremities: No pedal edema. Skin: No rashes, lesions or ulcers  Data Reviewed:    Labs: Basic Metabolic Panel: Recent Labs  Lab 09/08/22 1252 09/08/22 1616 09/08/22 2213 09/09/22 1716 09/09/22 2146 09/10/22 0303 09/11/22 0921 09/12/22 0510 09/13/22 0513  NA 136  --    < > 137  --  142 137 142 141  K >7.5* >7.5*   < > 4.5   < > 3.9 3.7 3.2* 3.9  CL 120*  --    < > 122*  --  122* 118* 116* 115*   CO2 8*  --    < > 12*  --  13* 13* 19* 20*  GLUCOSE 94 71   < > 97  --  74 83 85 85  BUN 25*  --    < > 19  --  15 14 14 13   CREATININE 1.32*  --    < > 1.32*  --  1.07 1.04 1.08 0.94  CALCIUM 9.2  --    < > 8.0*  --  8.0* 7.6* 7.4* 7.4*  MG 1.8  --   --   --   --   --   --   --   --   PHOS  --  2.2*  --   --   --   --   --   --   --    < > =  values in this interval not displayed.   GFR Estimated Creatinine Clearance: 61.6 mL/min (by C-G formula based on SCr of 0.94 mg/dL). Liver Function Tests: Recent Labs  Lab 09/08/22 1252  AST 35  ALT 20  ALKPHOS 145*  BILITOT 0.5  PROT 6.9  ALBUMIN 3.8   No results for input(s): "LIPASE", "AMYLASE" in the last 168 hours. No results for input(s): "AMMONIA" in the last 168 hours. Coagulation profile No results for input(s): "INR", "PROTIME" in the last 168 hours. COVID-19 Labs  No results for input(s): "DDIMER", "FERRITIN", "LDH", "CRP" in the last 72 hours.   Lab Results  Component Value Date   SARSCOV2NAA NEGATIVE 07/29/2022   SARSCOV2NAA NEGATIVE 03/23/2022   SARSCOV2NAA NEGATIVE 06/21/2020   SARSCOV2NAA POSITIVE (A) 06/20/2020    CBC: Recent Labs  Lab 09/08/22 1252 09/09/22 0303  WBC 9.6 8.6  NEUTROABS 7.6  --   HGB 10.5* 9.3*  HCT 33.7* 29.7*  MCV 96.3 95.8  PLT 388 331   Cardiac Enzymes: No results for input(s): "CKTOTAL", "CKMB", "CKMBINDEX", "TROPONINI" in the last 168 hours. BNP (last 3 results) No results for input(s): "PROBNP" in the last 8760 hours. CBG: Recent Labs  Lab 09/10/22 2005 09/11/22 0733 09/12/22 0745 09/12/22 1701 09/13/22 0748  GLUCAP 96 92 105* 111* 101*   D-Dimer: No results for input(s): "DDIMER" in the last 72 hours. Hgb A1c: No results for input(s): "HGBA1C" in the last 72 hours. Lipid Profile: No results for input(s): "CHOL", "HDL", "LDLCALC", "TRIG", "CHOLHDL", "LDLDIRECT" in the last 72 hours. Thyroid function studies: No results for input(s): "TSH", "T4TOTAL", "T3FREE",  "THYROIDAB" in the last 72 hours.  Invalid input(s): "FREET3" Anemia work up: No results for input(s): "VITAMINB12", "FOLATE", "FERRITIN", "TIBC", "IRON", "RETICCTPCT" in the last 72 hours. Sepsis Labs: Recent Labs  Lab 09/08/22 1252 09/09/22 0303  WBC 9.6 8.6   Microbiology Recent Results (from the past 240 hour(s))  MRSA Next Gen by PCR, Nasal     Status: None   Collection Time: 09/09/22  2:36 AM   Specimen: Nasal Mucosa; Nasal Swab  Result Value Ref Range Status   MRSA by PCR Next Gen NOT DETECTED NOT DETECTED Final    Comment: (NOTE) The GeneXpert MRSA Assay (FDA approved for NASAL specimens only), is one component of a comprehensive MRSA colonization surveillance program. It is not intended to diagnose MRSA infection nor to guide or monitor treatment for MRSA infections. Test performance is not FDA approved in patients less than 66 years old. Performed at [facility], [address] [place], [place] [address]      Medications:    apixaban  5 mg Oral BID   feeding supplement  237 mL Oral TID BM   mouth rinse  15 mL Mouth Rinse 4 times per day   pantoprazole  40 mg Oral BID   Continuous Infusions:      LOS: 5 days   Silvano Bilis  [facility]  09/13/2022, 9:02 AM

## 2022-09-13 NOTE — Plan of Care (Signed)
  Problem: Clinical Measurements: Goal: Will remain free from infection Outcome: Progressing   Problem: Nutrition: Goal: Adequate nutrition will be maintained Outcome: Progressing   Problem: Safety: Goal: Ability to remain free from injury will improve Outcome: Progressing   

## 2022-09-13 NOTE — Plan of Care (Signed)
?  Problem: Clinical Measurements: ?Goal: Ability to maintain clinical measurements within normal limits will improve ?Outcome: Progressing ?Goal: Will remain free from infection ?Outcome: Progressing ?  ?Problem: Nutrition: ?Goal: Adequate nutrition will be maintained ?Outcome: Progressing ?  ?Problem: Elimination: ?Goal: Will not experience complications related to bowel motility ?Outcome: Progressing ?Goal: Will not experience complications related to urinary retention ?Outcome: Progressing ?  ?Problem: Safety: ?Goal: Ability to remain free from injury will improve ?Outcome: Progressing ?  ?Problem: Skin Integrity: ?Goal: Risk for impaired skin integrity will decrease ?Outcome: Progressing ?  ?

## 2022-09-13 NOTE — Progress Notes (Signed)
Physical Therapy Treatment Patient Details Name: Frank Hernandez MRN: [id number] DOB: [birth year] Today's Date: 09/13/2022   History of Present Illness Frank Hernandez is an 82 y.o. male past medical history significant for PE on Eliquis metastatic bladder cancer, multiple myeloma sent in from the nursing home 09/08/22 for decreased appetite confusion and failure to thrive that started 2 to 3 days prior to admission.  Workup in the ED revealed a potassium of 7.4.    PT Comments   Pt admitted with above diagnosis.  Pt currently with functional limitations due to the deficits listed below (see PT Problem List). Pt in bed when PT arrived. Pt agreeable to therapy intervention. Pt demonstrated increased IND with bed mobility tasks, min guard and goals updated accordingly. Pt required mod A for sit to stand from elevated EOB to RW, pt standing tolerance and gait tasks limited due to R LE pain. Pt is unable to rate pain on numeric scale. Pt requested to sit and then able to perform SPT from bed to recliner with RW and mod A. Pt left in recliner and all needs in place, pt instructed to use call bell to communicate needs. Patient will benefit from continued inpatient follow up therapy, <3 hours/day at time of hospital d/c.  Pt will benefit from acute skilled PT  in current setting to increase their independence and safety with mobility to allow discharge.       Assistance Recommended at Discharge Frequent or constant Supervision/Assistance  If plan is discharge home, recommend the following:  Can travel by private vehicle    Two people to help with walking and/or transfers;A lot of help with bathing/dressing/bathroom   No  Equipment Recommendations  None recommended by PT    Recommendations for Other Services       Precautions / Restrictions Restrictions Weight Bearing Restrictions: No     Mobility  Bed Mobility Overal bed mobility: Needs Assistance Bed Mobility: Supine to Sit      Supine to sit: Min guard     General bed mobility comments: cues, HOB elevated and increased time    Transfers Overall transfer level: Needs assistance Equipment used: Rolling walker (2 wheels) Transfers: Sit to/from Stand, Bed to chair/wheelchair/BSC Sit to Stand: Mod assist Stand pivot transfers: Mod assist         General transfer comment: min cues with pt demonstrating good recall for UE placement, pt required cues for power up and extension posture, encouragement and self limiting R LE WB due to reported pain,.elevated surface. sit to stand and pt requested to sit and then able to complete SPT to recliner    Ambulation/Gait               General Gait Details: trial with RW and pt attempted to take steps however limited due to R LE pain   Stairs             Wheelchair Mobility     Tilt Bed    Modified Rankin (Stroke Patients Only)       Balance Overall balance assessment: Needs assistance Sitting-balance support: No upper extremity supported, Feet supported Sitting balance-Leahy Scale: Fair     Standing balance support: During functional activity, Bilateral upper extremity supported, Reliant on assistive device for balance Standing balance-Leahy Scale: Poor                              Cognition Arousal/Alertness: Awake/alert Behavior During Therapy:  WFL for tasks assessed/performed Overall Cognitive Status: History of cognitive impairments - at baseline                                          Exercises      General Comments        Pertinent Vitals/Pain Pain Assessment Pain Assessment: Faces Faces Pain Scale: Hurts little more Pain Location: R LE Pain Descriptors / Indicators: Discomfort, Guarding (limiting R LE WB) Pain Intervention(s): Limited activity within patient's tolerance, Monitored during session    Home Living Family/patient expects to be discharged to:: Skilled nursing facility                    Additional Comments: Pt sleeps in his Lift recliner.  BSC atop toilet.    Prior Function            PT Goals (current goals can now be found in the care plan section) Acute Rehab PT Goals Patient Stated Goal: walk PT Goal Formulation: With patient/family Time For Goal Achievement: 09/24/22 Potential to Achieve Goals: Fair Progress towards PT goals: Progressing toward goals    Frequency    Min 1X/week      PT Plan Current plan remains appropriate    Co-evaluation              AM-PAC PT "6 Clicks" Mobility   Outcome Measure  Help needed turning from your back to your side while in a flat bed without using bedrails?: A Little Help needed moving from lying on your back to sitting on the side of a flat bed without using bedrails?: A Little Help needed moving to and from a bed to a chair (including a wheelchair)?: A Lot Help needed standing up from a chair using your arms (e.g., wheelchair or bedside chair)?: A Lot Help needed to walk in hospital room?: Total Help needed climbing 3-5 steps with a railing? : Total 6 Click Score: 12    End of Session Equipment Utilized During Treatment: Gait belt Activity Tolerance: Patient tolerated treatment well Patient left: with call bell/phone within reach Nurse Communication: Mobility status PT Visit Diagnosis: Unsteadiness on feet (R26.81);Pain Pain - part of body: Ankle and joints of foot;Leg;Knee     Time: 1012-1029 PT Time Calculation (min) (ACUTE ONLY): 17 min  Charges:    $Therapeutic Activity: 8-22 mins PT General Charges $$ ACUTE PT VISIT: 1 Visit                     Frank Hernandez, PT Acute Rehab    Frank Hernandez 09/13/2022, 11:25 AM

## 2022-09-13 NOTE — TOC Progression Note (Signed)
Transition of Care [facility]) - Progression Note    Patient Details  Name: Frank Hernandez MRN: [id number] Date of Birth: Nov 28, 1940  [facility] CM/SW Contact  Otelia Santee, LCSW Phone Number: 09/13/2022, 12:19 PM  Clinical Narrative:    Insurance Berkley Harvey has been requested for pt to return to [facility] for ST SNF.  Insurance currently pending will follow for approval.    Expected Discharge Plan: Skilled Nursing Facility Barriers to Discharge: Continued Medical Work up  Expected Discharge Plan and Services In-house Referral: NA Discharge Planning Services: CM Consult Post Acute Care Choice: Skilled Nursing Facility Living arrangements for the past 2 months: Skilled Nursing Facility                 DME Arranged: N/A DME Agency: NA       HH Arranged: NA HH Agency: NA         Social Determinants of Health (SDOH) Interventions SDOH Screenings   Food Insecurity: No Food Insecurity (09/09/2022)  Housing: Low Risk  (09/09/2022)  Transportation Needs: No Transportation Needs (09/09/2022)  Utilities: Not At Risk (09/09/2022)  Depression (PHQ2-9): Low Risk  (04/29/2022)  Tobacco Use: Low Risk  (09/11/2022)    Readmission Risk Interventions    09/09/2022   12:00 PM 08/16/2022   10:15 AM 08/03/2022    8:39 AM  Readmission Risk Prevention Plan  Transportation Screening Complete Complete Complete  Medication Review Oceanographer) Complete Complete Complete  PCP or Specialist appointment within 3-5 days of discharge Complete Complete   HRI or Home Care Consult Complete Complete Complete  SW Recovery Care/Counseling Consult Complete Complete Complete  Palliative Care Screening Not Applicable Not Applicable Not Applicable  Skilled Nursing Facility Complete Complete Complete

## 2022-09-14 DIAGNOSIS — A000 Cholera due to Vibrio cholerae 01, biovar cholerae: Secondary | ICD-10-CM | POA: Diagnosis not present

## 2022-09-14 DIAGNOSIS — R41841 Cognitive communication deficit: Secondary | ICD-10-CM | POA: Diagnosis not present

## 2022-09-14 DIAGNOSIS — I10 Essential (primary) hypertension: Secondary | ICD-10-CM | POA: Diagnosis not present

## 2022-09-14 DIAGNOSIS — R634 Abnormal weight loss: Secondary | ICD-10-CM | POA: Diagnosis not present

## 2022-09-14 DIAGNOSIS — E1159 Type 2 diabetes mellitus with other circulatory complications: Secondary | ICD-10-CM | POA: Diagnosis not present

## 2022-09-14 DIAGNOSIS — I2609 Other pulmonary embolism with acute cor pulmonale: Secondary | ICD-10-CM | POA: Diagnosis not present

## 2022-09-14 DIAGNOSIS — E785 Hyperlipidemia, unspecified: Secondary | ICD-10-CM | POA: Diagnosis not present

## 2022-09-14 DIAGNOSIS — M6281 Muscle weakness (generalized): Secondary | ICD-10-CM | POA: Diagnosis not present

## 2022-09-14 DIAGNOSIS — C7951 Secondary malignant neoplasm of bone: Secondary | ICD-10-CM | POA: Diagnosis not present

## 2022-09-14 DIAGNOSIS — E876 Hypokalemia: Secondary | ICD-10-CM | POA: Diagnosis not present

## 2022-09-14 DIAGNOSIS — R531 Weakness: Secondary | ICD-10-CM | POA: Diagnosis not present

## 2022-09-14 DIAGNOSIS — I2699 Other pulmonary embolism without acute cor pulmonale: Secondary | ICD-10-CM | POA: Diagnosis not present

## 2022-09-14 DIAGNOSIS — G928 Other toxic encephalopathy: Secondary | ICD-10-CM | POA: Diagnosis not present

## 2022-09-14 DIAGNOSIS — N182 Chronic kidney disease, stage 2 (mild): Secondary | ICD-10-CM | POA: Diagnosis not present

## 2022-09-14 DIAGNOSIS — R262 Difficulty in walking, not elsewhere classified: Secondary | ICD-10-CM | POA: Diagnosis not present

## 2022-09-14 DIAGNOSIS — L539 Erythematous condition, unspecified: Secondary | ICD-10-CM | POA: Diagnosis not present

## 2022-09-14 DIAGNOSIS — R4701 Aphasia: Secondary | ICD-10-CM | POA: Diagnosis not present

## 2022-09-14 DIAGNOSIS — K56609 Unspecified intestinal obstruction, unspecified as to partial versus complete obstruction: Secondary | ICD-10-CM | POA: Diagnosis not present

## 2022-09-14 DIAGNOSIS — I498 Other specified cardiac arrhythmias: Secondary | ICD-10-CM | POA: Diagnosis not present

## 2022-09-14 DIAGNOSIS — R52 Pain, unspecified: Secondary | ICD-10-CM | POA: Diagnosis not present

## 2022-09-14 DIAGNOSIS — D509 Iron deficiency anemia, unspecified: Secondary | ICD-10-CM | POA: Diagnosis not present

## 2022-09-14 DIAGNOSIS — R5381 Other malaise: Secondary | ICD-10-CM | POA: Diagnosis not present

## 2022-09-14 DIAGNOSIS — D649 Anemia, unspecified: Secondary | ICD-10-CM | POA: Diagnosis not present

## 2022-09-14 DIAGNOSIS — E875 Hyperkalemia: Secondary | ICD-10-CM | POA: Diagnosis not present

## 2022-09-14 DIAGNOSIS — C9000 Multiple myeloma not having achieved remission: Secondary | ICD-10-CM | POA: Diagnosis not present

## 2022-09-14 DIAGNOSIS — E8721 Acute metabolic acidosis: Secondary | ICD-10-CM | POA: Diagnosis not present

## 2022-09-14 DIAGNOSIS — L89613 Pressure ulcer of right heel, stage 3: Secondary | ICD-10-CM | POA: Diagnosis not present

## 2022-09-14 DIAGNOSIS — N179 Acute kidney failure, unspecified: Secondary | ICD-10-CM | POA: Diagnosis not present

## 2022-09-14 DIAGNOSIS — E43 Unspecified severe protein-calorie malnutrition: Secondary | ICD-10-CM | POA: Diagnosis not present

## 2022-09-14 DIAGNOSIS — R29898 Other symptoms and signs involving the musculoskeletal system: Secondary | ICD-10-CM | POA: Diagnosis not present

## 2022-09-14 DIAGNOSIS — M79672 Pain in left foot: Secondary | ICD-10-CM | POA: Diagnosis not present

## 2022-09-14 DIAGNOSIS — Z7401 Bed confinement status: Secondary | ICD-10-CM | POA: Diagnosis not present

## 2022-09-14 DIAGNOSIS — M25561 Pain in right knee: Secondary | ICD-10-CM | POA: Diagnosis not present

## 2022-09-14 DIAGNOSIS — R630 Anorexia: Secondary | ICD-10-CM | POA: Diagnosis not present

## 2022-09-14 DIAGNOSIS — E46 Unspecified protein-calorie malnutrition: Secondary | ICD-10-CM | POA: Diagnosis not present

## 2022-09-14 LAB — GLUCOSE, CAPILLARY: Glucose-Capillary: 74 mg/dL (ref 70–99)

## 2022-09-14 NOTE — Care Management Important Message (Signed)
Important Message  Patient Details IM Letter given. Name: Frank Hernandez MRN: [id number] Date of Birth: October 27, 1940   Medicare Important Message Given:  Yes     Caren Macadam 09/14/2022, 12:22 PM

## 2022-09-14 NOTE — Progress Notes (Signed)
Facility notified. Report given to receiving nurse Shay.

## 2022-09-14 NOTE — Progress Notes (Signed)
Mobility Specialist - Progress Note   09/14/22 1042  Mobility  Activity Transferred from bed to chair  Level of Assistance Contact guard assist, steadying assist  Assistive Device Front wheel walker  Distance Ambulated (ft) 6 ft  Range of Motion/Exercises Active  Activity Response Tolerated well  Mobility Referral Yes  $Mobility charge 1 Mobility  Mobility Specialist Start Time (ACUTE ONLY) 1030  Mobility Specialist Stop Time (ACUTE ONLY) 1040  Mobility Specialist Time Calculation (min) (ACUTE ONLY) 10 min   Pt received in bed and agreed to transfer.   Pt was Min A for STS, contact for the transfer. Pt took small, shuffled steps laterally towards the recliner, needed cues for safety awareness for staying on walker.   Returned to chair with all needs met.  Marilynne Halsted Mobility Specialist

## 2022-09-14 NOTE — Discharge Summary (Signed)
Physician Discharge Summary  Frank Hernandez WUJ:[id number] DOB: 10/12/1940 DOA: 09/08/2022  PCP: [name] (Inactive)  Admit date: 09/08/2022 Discharge date: 09/14/2022  Time spent: 45 minutes  Recommendations for Outpatient Follow-up:  Needs bmet/cbc 1 mo Do not supplement potassium going forward unless absolutely necessary CC Dr. Candise Che for him for dedicated chemo Requires neurocognitive testing/MoCA testing/MMSE as an outpatient in 2 to 3 weeks   Discharge Diagnoses:  MAIN problem for hospitalization   Severe hyperkalemia with metabolic acidosis AKI Cognitive deficit?  Mild dementia Left shin skin tear Pulmonary embolism on apixaban  Please see below for itemized issues addressed in HOpsital- refer to other progress notes for clarity if needed  Discharge Condition: Fair  Diet recommendation: Regular  Filed Weights   09/12/22 0500 09/13/22 0700 09/14/22 0603  Weight: 81.7 kg 81.1 kg 82 kg    History of present illness:  82 year old wheelchair-bound male known history of multiple myeloma diagnosed l 04/14/2022 in the setting of a hospitalization for pathological left hip fracture currently under care of Dr. 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 with normocytic anemia on IV iron CKD 3 AA Last hospitalized c DVT/PE aspiration pneumonia 08/16/2022 placed on Eliquis twice daily while hospitalized and had some aspiration pneumonia which was treated at the time in addition to a stage I pressure ulcer and a left heel chronic unstageable pressure injury  Patient was sent over from the skilled facility 09/08/2022 because of decreased appetite confusion adult failure to thrive over several days baseline is oriented Patient was confused in ED potassium was 7.5 hyperkalemia was treated aggressively (D50 Lasix IV fluid Lokelma) and patient was sent to stepdown unit CT head showed large partial empty sella    prerenal azotemia resolved   Hospital Course:  Hyperkalemia Secondary to oxygen supplementation 40 twice daily, volume depletion and acidosis Stop all supplements-outpatient careful consideration for replacement-nephrology was consulted this admission and his potassium trended downward from 7.5-5 0.9-3.9 and Lokelma was used initially and then discontinued Will need labs in a week  AKI superimposed on CKD 3 AA Metabolic acidosis secondary to AKI Previously on IVF-now tolerating some diet Nephrology recommended Sodium bicarb until serum CO2 above 20-we have discontinued the bicarb  Toxic metabolic encephalopathy on admission 2/2 volume depletion CT head as above-no further workup  Pulmonary embolism diagnosed 08/16/2022-continue apixaban 5 twice daily Careful and ambulate with therapy-May need lifelong therapy based on underlying myeloma diagnosis  Multiple myeloma daratumumab Velcade dexamethasone AL amyloid Outpatient follow-up with Dr. Antony Haste at discharge Probably requires input of palliative care in the outpatient setting  Pressure ulcer of left heel Resolved- does also have a buttocks injury on the right side as well as a shin lesion which is healing well and he does not have lower extremity edema   Discharge Exam: Vitals:   09/13/22 1927 09/14/22 0603  BP: 111/70 125/72  Pulse: 73 64  Resp: 20 19  Temp: 98.2 F (36.8 C) 98.7 F (37.1 C)  SpO2: 100% 100%    Subj on day of d/c   Awake coherent no distress slightly slow speech Chest is clear S1-S2 no murmur Abdomen soft no rebound Left shin has a small skin tear No heel wound He is able to tell me the year correctly but does not get the place or president correct    Discharge Instructions    Allergies as of 09/14/2022   No Known Allergies      Medication List  STOP taking these medications    ipratropium-albuterol 0.5-2.5 (3) MG/3ML Soln Commonly known as: DUONEB   potassium chloride SA  20 MEQ tablet Commonly known as: KLOR-CON M       TAKE these medications    acetaminophen 500 MG tablet Commonly known as: TYLENOL Take 1,000 mg by mouth every 8 (eight) hours as needed for moderate pain.   ammonium lactate 12 % lotion Commonly known as: LAC-HYDRIN Apply 1 Application topically See admin instructions. Apply to both feet and legs at bedtime   apixaban 5 MG Tabs tablet Commonly known as: ELIQUIS Take 1 tablet (5 mg total) by mouth 2 (two) times daily.   feeding supplement (PRO-STAT SUGAR FREE 64) Liqd Take 30 mLs by mouth 2 (two) times daily.   ferrous sulfate 325 (65 FE) MG tablet Take 1 tablet (325 mg total) by mouth 2 (two) times daily with a meal.   multivitamin with minerals Tabs tablet Take 1 tablet by mouth daily.   pantoprazole 40 MG tablet Commonly known as: PROTONIX Take 1 tablet (40 mg total) by mouth 2 (two) times daily.   Vitamin D (Ergocalciferol) 1.25 MG (50000 UNIT) Caps capsule Commonly known as: DRISDOL Take 1 capsule (50,000 Units total) by mouth once a week. What changed: when to take this               Discharge Care Instructions  (From admission, onward)           Start     Ordered   09/14/22 0000  Discharge wound care:       Comments: 09/09/22 1200    Wound care  Daily      Comments: wet-dry   09/14/22 1124           No Known Allergies    The results of significant diagnostics from this hospitalization (including imaging, microbiology, ancillary and laboratory) are listed below for reference.    Significant Diagnostic Studies: US RENAL  Result Date: 09/08/2022 CLINICAL DATA:  Bladder obstruction. EXAM: RENAL / URINARY TRACT ULTRASOUND COMPLETE COMPARISON:  CT abdomen pelvis dated 07/29/2022. FINDINGS: Evaluation is very limited due to significant amount of overlying bowel gas as well as body habitus. Right Kidney: Renal measurements: 8.4 x 4.4 x 5.4 cm = volume: 105 mL. Normal echogenicity. No  hydronephrosis or shadowing stone. There is a 2 cm upper pole cyst. Left Kidney: Renal measurements: 9.3 x 4.9 x 5.1 cm = volume: 120 mL. Normal echogenicity. No hydronephrosis or shadowing stone. A 4 cm upper pole cyst. Bladder: Appears normal for degree of bladder distention. Other: None. IMPRESSION: No hydronephrosis or shadowing stone. Electronically Signed   By: Elgie Collard M.D.   On: 09/08/2022 21:51   DG Hip Unilat W or Wo Pelvis 2-3 Views Right  Result Date: 09/08/2022 CLINICAL DATA:  Bilateral hip pain. EXAM: DG HIP (WITH OR WITHOUT PELVIS) 2-3V RIGHT; DG HIP (WITH OR WITHOUT PELVIS) 2-3V LEFT COMPARISON:  None Available. FINDINGS: Evaluation is limited due to positioning and soft tissue attenuation. There is no acute fracture or dislocation. Status post prior ORIF of bilateral femoral neck fractures. The visualized hardware are intact. The bones are osteopenic. Degenerative changes of the lumbar spine. The soft tissues are unremarkable. IMPRESSION: 1. No acute fracture or dislocation. 2. Status post prior ORIF of bilateral femoral neck fractures. Electronically Signed   By: Elgie Collard M.D.   On: 09/08/2022 17:44   DG Hip Unilat W or Wo Pelvis 2-3 Views Left  Result  Date: 09/08/2022 CLINICAL DATA:  Bilateral hip pain. EXAM: DG HIP (WITH OR WITHOUT PELVIS) 2-3V RIGHT; DG HIP (WITH OR WITHOUT PELVIS) 2-3V LEFT COMPARISON:  None Available. FINDINGS: Evaluation is limited due to positioning and soft tissue attenuation. There is no acute fracture or dislocation. Status post prior ORIF of bilateral femoral neck fractures. The visualized hardware are intact. The bones are osteopenic. Degenerative changes of the lumbar spine. The soft tissues are unremarkable. IMPRESSION: 1. No acute fracture or dislocation. 2. Status post prior ORIF of bilateral femoral neck fractures. Electronically Signed   By: Elgie Collard M.D.   On: 09/08/2022 17:44   DG Knee Right Port  Result Date:  09/08/2022 CLINICAL DATA:  Failure to thrive Altered mental status Decreased appetite EXAM: PORTABLE RIGHT KNEE - 1-2 VIEW COMPARISON:  07/30/2022 FINDINGS: No fracture or dislocation. Soft tissues are unremarkable. Visualized portions of intramedullary rod and screws are intact without periprosthetic fracture or lucency. Advanced degenerative changes of the right knee are again seen. Lateral view is limited due to obliquity. IMPRESSION: No acute abnormality of the right knee. Electronically Signed   By: Acquanetta Belling M.D.   On: 09/08/2022 13:50   CT HEAD WO CONTRAST  Result Date: 09/08/2022 CLINICAL DATA:  Mental status change, unknown cause EXAM: CT HEAD WITHOUT CONTRAST TECHNIQUE: Contiguous axial images were obtained from the base of the skull through the vertex without intravenous contrast. RADIATION DOSE REDUCTION: This exam was performed according to the departmental dose-optimization program which includes automated exposure control, adjustment of the mA and/or kV according to patient size and/or use of iterative reconstruction technique. COMPARISON:  CT Head 07/01/22 FINDINGS: Brain: No evidence of acute infarction, hemorrhage, hydrocephalus, extra-axial collection or mass lesion/mass effect. Mineralization of the basal ganglia bilaterally. Enlarged and partially empty sella. Vascular: No hyperdense vessel or unexpected calcification. Skull: Normal. Negative for fracture or focal lesion. Sinuses/Orbits: No middle ear or mastoid effusion. Paranasal sinuses are notable for polypoid mucosal thickening in the left maxillary sinus. Orbits are unremarkable Other: None. IMPRESSION: 1. No acute intracranial abnormality. 2. Enlarged and partially empty sella, which is nonspecific but can be seen in the setting of idiopathic intracranial hypertension. Electronically Signed   By: Lorenza Cambridge M.D.   On: 09/08/2022 13:26    Microbiology: Recent Results (from the past 240 hour(s))  MRSA Next Gen by PCR, Nasal      Status: None   Collection Time: 09/09/22  2:36 AM   Specimen: Nasal Mucosa; Nasal Swab  Result Value Ref Range Status   MRSA by PCR Next Gen NOT DETECTED NOT DETECTED Final    Comment: (NOTE) The GeneXpert MRSA Assay (FDA approved for NASAL specimens only), is one component of a comprehensive MRSA colonization surveillance program. It is not intended to diagnose MRSA infection nor to guide or monitor treatment for MRSA infections. Test performance is not FDA approved in patients less than 27 years old. Performed at [facility], [address], [place], [place] [address]      Labs: Basic Metabolic Panel: Recent Labs  Lab 09/08/22 1252 09/08/22 1616 09/08/22 2213 09/09/22 1716 09/09/22 2146 09/10/22 0303 09/11/22 0921 09/12/22 0510 09/13/22 0513  NA 136  --    < > 137  --  142 137 142 141  K >7.5* >7.5*   < > 4.5 4.2 3.9 3.7 3.2* 3.9  CL 120*  --    < > 122*  --  122* 118* 116* 115*  CO2 8*  --    < >  12*  --  13* 13* 19* 20*  GLUCOSE 94 71   < > 97  --  74 83 85 85  BUN 25*  --    < > 19  --  15 14 14 13   CREATININE 1.32*  --    < > 1.32*  --  1.07 1.04 1.08 0.94  CALCIUM 9.2  --    < > 8.0*  --  8.0* 7.6* 7.4* 7.4*  MG 1.8  --   --   --   --   --   --   --   --   PHOS  --  2.2*  --   --   --   --   --   --   --    < > = values in this interval not displayed.   Liver Function Tests: Recent Labs  Lab 09/08/22 1252  AST 35  ALT 20  ALKPHOS 145*  BILITOT 0.5  PROT 6.9  ALBUMIN 3.8   No results for input(s): "LIPASE", "AMYLASE" in the last 168 hours. No results for input(s): "AMMONIA" in the last 168 hours. CBC: Recent Labs  Lab 09/08/22 1252 09/09/22 0303  WBC 9.6 8.6  NEUTROABS 7.6  --   HGB 10.5* 9.3*  HCT 33.7* 29.7*  MCV 96.3 95.8  PLT 388 331   Cardiac Enzymes: No results for input(s): "CKTOTAL", "CKMB", "CKMBINDEX", "TROPONINI" in the last 168 hours. BNP: BNP (last 3 results) Recent Labs    07/29/22 0835 07/30/22 2304  08/09/22 1713  BNP 145.9* 357.6* 671.3*    ProBNP (last 3 results) No results for input(s): "PROBNP" in the last 8760 hours.  CBG: Recent Labs  Lab 09/12/22 0745 09/12/22 1701 09/13/22 0748 09/13/22 1613 09/14/22 0730  GLUCAP 105* 111* 101* 121* 74       Signed:  Rhetta Mura MD   [facility] 09/14/2022, 10:43 AM

## 2022-09-14 NOTE — [place] FL2 (Signed)
[place] MEDICAID FL2 LEVEL OF CARE FORM     IDENTIFICATION  Patient Name: Frank Hernandez Birthdate: 1940/12/27 Sex: male Admission Date (Current Location): 09/08/2022  [facility] and [place] Number:  Producer, television/film/video and Address:  [facility],  [address] 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   Hypomagnesemia 07/29/2022   Bowel obstruction (HCC) 07/29/2022   Prolonged QT interval 07/29/2022   Cellulitis of both lower extremities 07/29/2022   Acute pulmonary embolism (HCC) 07/29/2022   Glaucoma 07/29/2022   Bradyarrhythmia 06/01/2022   Sinus pause 05/29/2022   Multiple myeloma (HCC) 05/29/2022   AKI (acute kidney injury) (HCC) 05/28/2022   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   Hypotension 05/28/2022   Dehydration 05/28/2022   Diarrhea 05/28/2022   Lactic acidosis 05/28/2022   Counseling regarding advance care planning and goals of care 04/22/2022   Multiple myeloma not having achieved remission (HCC) 04/21/2022   Vitamin D deficiency 04/19/2022   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) 04/15/2022   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   Closed displaced subtrochanteric fracture of right femur (HCC) 04/13/2022   Normocytic anemia 04/13/2022   Chronic kidney disease, stage 3a (HCC) 04/03/2022   Decreased oral intake  04/02/2022   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   Essential hypertension 04/01/2022   Pain in joint, lower leg 04/01/2022   Effusion of right knee 03/30/2022   Pressure injury of skin 03/30/2022   Effusion, right knee 03/30/2022   Concussion with unknown loss of consciousness status 12/24/2020   Hypokalemia    Rib fractures 06/10/2020   Severe essential hypertension 11/15/2018   Syncope 12/26/2015   Pre-diabetes 12/26/2015    Orientation RESPIRATION BLADDER Height & Weight     Self, Time, Situation, Place  Normal Incontinent Weight: 82 kg Height:  5\' 9"  (175.3 cm)  BEHAVIORAL SYMPTOMS/MOOD NEUROLOGICAL BOWEL NUTRITION STATUS      Incontinent Diet (See discharge summary)  AMBULATORY STATUS COMMUNICATION OF NEEDS Skin   Limited Assist Verbally Other (Comment) (Heel Right Unstageable - Full thickness tissue loss in which the base of the injury is covered by slough)                       Personal Care Assistance Level of Assistance  Bathing, Feeding, Dressing Bathing Assistance: Limited assistance Feeding assistance: Limited assistance Dressing Assistance: Limited assistance     Functional Limitations Info  Sight, Hearing, Speech Sight Info: Impaired Hearing Info: Adequate Speech Info: Adequate    SPECIAL CARE FACTORS FREQUENCY  PT (By licensed PT), OT (By licensed OT)     PT Frequency: 5x/wk OT Frequency: 5x/wk            Contractures Contractures Info: Not present    Additional Factors Info  Code Status, Allergies Code Status Info: FULL Allergies Info: NKA           Current  Medications (09/14/2022):  This is the current hospital active medication list Current Facility-Administered Medications  Medication Dose Route Frequency Provider Last Rate Last Admin   acetaminophen (TYLENOL) tablet 650 mg  650 mg Oral Q6H PRN Anthoney Harada, NP   650 mg at 09/11/22 1344   apixaban (ELIQUIS) tablet 5 mg  5 mg Oral BID Marinda Elk, MD   5 mg at  09/14/22 0940   feeding supplement (ENSURE ENLIVE / ENSURE PLUS) liquid 237 mL  237 mL Oral TID BM Marinda Elk, MD   237 mL at 09/14/22 0940   Oral care mouth rinse  15 mL Mouth Rinse 4 times per day Marinda Elk, MD   15 mL at 09/14/22 0939   Oral care mouth rinse  15 mL Mouth Rinse PRN Marinda Elk, MD       pantoprazole (PROTONIX) EC tablet 40 mg  40 mg Oral BID Marinda Elk, MD   40 mg at 09/14/22 0940   sorbitol 70 % solution 30 mL  30 mL Oral Daily PRN Marinda Elk, MD         Discharge Medications: Please see discharge summary for a list of discharge medications.  Relevant Imaging Results:  Relevant Lab Results:   Additional Information SS# [id number]  Otelia Santee, LCSW

## 2022-09-14 NOTE — Plan of Care (Signed)
   Problem: Clinical Measurements: Goal: Will remain free from infection Outcome: Progressing   Problem: Safety: Goal: Ability to remain free from injury will improve Outcome: Progressing   Problem: Skin Integrity: Goal: Risk for impaired skin integrity will decrease Outcome: Progressing   

## 2022-09-14 NOTE — TOC Transition Note (Signed)
Transition of Care [facility]) - CM/SW Discharge Note   Patient Details  Name: Frank Hernandez MRN: [id number] Date of Birth: 07-12-1940  Transition of Care [facility]) CM/SW Contact:  Otelia Santee, LCSW Phone Number: 09/14/2022, 11:49 AM   Clinical Narrative:    Pt's insurance Berkley Harvey has been approved. Pt is able to transfer to [facility] today. Pt will be returning to room 117b. RN to call report to [phone]. Spoke with pt's wife and confirmed discharge plans. [facility] called at 11:50am for transportation.    Final next level of care: Skilled Nursing Facility Barriers to Discharge: Barriers Resolved   Patient Goals and CMS Choice CMS Medicare.gov Compare Post Acute Care list provided to:: Patient Represenative (must comment) ([name] -spouse) Choice offered to / list presented to : Spouse  Discharge Placement     Existing PASRR number confirmed : 09/13/22          Patient chooses bed at: [facility] Patient to be transferred to facility by: PTAR Name of family member notified: [name] Patient and family notified of of transfer: 09/14/22  Discharge Plan and Services Additional resources added to the After Visit Summary for   In-house Referral: NA Discharge Planning Services: CM Consult Post Acute Care Choice: Skilled Nursing Facility          DME Arranged: N/A DME Agency: NA       HH Arranged: NA HH Agency: NA        Social Determinants of Health (SDOH) Interventions SDOH Screenings   Food Insecurity: No Food Insecurity (09/09/2022)  Housing: Low Risk  (09/09/2022)  Transportation Needs: No Transportation Needs (09/09/2022)  Utilities: Not At Risk (09/09/2022)  Depression (PHQ2-9): Low Risk  (04/29/2022)  Tobacco Use: Low Risk  (09/11/2022)     Readmission Risk Interventions    09/09/2022   12:00 PM 08/16/2022   10:15 AM 08/03/2022    8:39 AM  Readmission Risk Prevention Plan  Transportation Screening Complete Complete Complete   Medication Review Oceanographer) Complete Complete Complete  PCP or Specialist appointment within 3-5 days of discharge Complete Complete   HRI or Home Care Consult Complete Complete Complete  SW Recovery Care/Counseling Consult Complete Complete Complete  Palliative Care Screening Not Applicable Not Applicable Not Applicable  Skilled Nursing Facility Complete Complete Complete

## 2022-09-15 DIAGNOSIS — E1159 Type 2 diabetes mellitus with other circulatory complications: Secondary | ICD-10-CM | POA: Diagnosis not present

## 2022-09-15 DIAGNOSIS — R531 Weakness: Secondary | ICD-10-CM | POA: Diagnosis not present

## 2022-09-15 DIAGNOSIS — E875 Hyperkalemia: Secondary | ICD-10-CM | POA: Diagnosis not present

## 2022-09-15 DIAGNOSIS — C9000 Multiple myeloma not having achieved remission: Secondary | ICD-10-CM | POA: Diagnosis not present

## 2022-09-15 DIAGNOSIS — C7951 Secondary malignant neoplasm of bone: Secondary | ICD-10-CM | POA: Diagnosis not present

## 2022-09-16 DIAGNOSIS — R630 Anorexia: Secondary | ICD-10-CM | POA: Diagnosis not present

## 2022-09-16 DIAGNOSIS — M6281 Muscle weakness (generalized): Secondary | ICD-10-CM | POA: Diagnosis not present

## 2022-09-16 DIAGNOSIS — E46 Unspecified protein-calorie malnutrition: Secondary | ICD-10-CM | POA: Diagnosis not present

## 2022-09-17 DIAGNOSIS — E46 Unspecified protein-calorie malnutrition: Secondary | ICD-10-CM | POA: Diagnosis not present

## 2022-09-17 DIAGNOSIS — R630 Anorexia: Secondary | ICD-10-CM | POA: Diagnosis not present

## 2022-09-17 DIAGNOSIS — R634 Abnormal weight loss: Secondary | ICD-10-CM | POA: Diagnosis not present

## 2022-09-18 DIAGNOSIS — E875 Hyperkalemia: Secondary | ICD-10-CM | POA: Diagnosis not present

## 2022-09-18 DIAGNOSIS — E46 Unspecified protein-calorie malnutrition: Secondary | ICD-10-CM | POA: Diagnosis not present

## 2022-09-18 DIAGNOSIS — D649 Anemia, unspecified: Secondary | ICD-10-CM | POA: Diagnosis not present

## 2022-09-21 DIAGNOSIS — E875 Hyperkalemia: Secondary | ICD-10-CM | POA: Diagnosis not present

## 2022-09-21 DIAGNOSIS — R29898 Other symptoms and signs involving the musculoskeletal system: Secondary | ICD-10-CM | POA: Diagnosis not present

## 2022-09-21 DIAGNOSIS — D649 Anemia, unspecified: Secondary | ICD-10-CM | POA: Diagnosis not present

## 2022-09-22 DIAGNOSIS — E785 Hyperlipidemia, unspecified: Secondary | ICD-10-CM | POA: Diagnosis not present

## 2022-09-22 DIAGNOSIS — D509 Iron deficiency anemia, unspecified: Secondary | ICD-10-CM | POA: Diagnosis not present

## 2022-09-22 DIAGNOSIS — M6281 Muscle weakness (generalized): Secondary | ICD-10-CM | POA: Diagnosis not present

## 2022-09-22 DIAGNOSIS — N179 Acute kidney failure, unspecified: Secondary | ICD-10-CM | POA: Diagnosis not present

## 2022-09-22 DIAGNOSIS — C7951 Secondary malignant neoplasm of bone: Secondary | ICD-10-CM | POA: Diagnosis not present

## 2022-09-22 DIAGNOSIS — C9000 Multiple myeloma not having achieved remission: Secondary | ICD-10-CM | POA: Diagnosis not present

## 2022-09-22 DIAGNOSIS — E875 Hyperkalemia: Secondary | ICD-10-CM | POA: Diagnosis not present

## 2022-09-23 DIAGNOSIS — R52 Pain, unspecified: Secondary | ICD-10-CM | POA: Diagnosis not present

## 2022-09-23 DIAGNOSIS — L89613 Pressure ulcer of right heel, stage 3: Secondary | ICD-10-CM | POA: Diagnosis not present

## 2022-09-23 DIAGNOSIS — R29898 Other symptoms and signs involving the musculoskeletal system: Secondary | ICD-10-CM | POA: Diagnosis not present

## 2022-09-24 ENCOUNTER — Inpatient Hospital Stay: Payer: Medicare PPO | Admitting: Dietician

## 2022-09-24 ENCOUNTER — Telehealth: Payer: Self-pay

## 2022-09-24 ENCOUNTER — Inpatient Hospital Stay: Payer: Medicare PPO | Attending: Hematology

## 2022-09-24 ENCOUNTER — Inpatient Hospital Stay: Payer: Medicare PPO

## 2022-09-24 NOTE — Progress Notes (Deleted)
Nutrition Follow-up:  Patient with multiple myeloma. He is currently receiving DaraCyBorD q28d   Noted recent admissions: 5/30-6/17 - bowel obstruction, stg I pressure ulcer/chronic unstageable to left heel (d/c to SNF) 7/10-7/16 - hyperkalemia, FTT, sev PCM   Medications: ***  Labs: ***  Anthropometrics:   Height: *** Weight: *** UBW: *** BMI: ***    NUTRITION DIAGNOSIS: Inadequate oral intake continues    MALNUTRITION DIAGNOSIS: Severe malnutrition diagnosed by inpt RD during last admission - ongoing    INTERVENTION: ***    MONITORING, EVALUATION, GOAL: weight trends, intake   NEXT VISIT: ***

## 2022-09-24 NOTE — Telephone Encounter (Signed)
Following up with Frank Hernandez to discussed missed appointment for cancer treatment today.  Frank Hernandez was discharged to [facility] on 09-14-22. Frank Hernandez stated that Frank Hernandez  on 08-06-22 told her and her husband that the treatments were to be held while in hospital . Frank Hernandez stated that she spoke with Dr. Clyda Greener nurse Waynetta Sandy at some point after discharge 08-16-22 and readmission 09-08-22 to see if Frank Hernandez wanted to resume the treatment. Frank Hernandez did not hear back. Told Frank Hernandez that this information will be given to [facility] and Frank Hernandez to review upon their return to the office.  Frank Hernandez has an office visit scheduled with Frank Hernandez for lab, office visit, and treatment on 10-08-22.

## 2022-09-25 DIAGNOSIS — R29898 Other symptoms and signs involving the musculoskeletal system: Secondary | ICD-10-CM | POA: Diagnosis not present

## 2022-09-25 DIAGNOSIS — D649 Anemia, unspecified: Secondary | ICD-10-CM | POA: Diagnosis not present

## 2022-09-25 DIAGNOSIS — N182 Chronic kidney disease, stage 2 (mild): Secondary | ICD-10-CM | POA: Diagnosis not present

## 2022-09-25 DIAGNOSIS — E876 Hypokalemia: Secondary | ICD-10-CM | POA: Diagnosis not present

## 2022-09-28 DIAGNOSIS — E875 Hyperkalemia: Secondary | ICD-10-CM | POA: Diagnosis not present

## 2022-09-28 DIAGNOSIS — E46 Unspecified protein-calorie malnutrition: Secondary | ICD-10-CM | POA: Diagnosis not present

## 2022-09-29 DIAGNOSIS — E875 Hyperkalemia: Secondary | ICD-10-CM | POA: Diagnosis not present

## 2022-09-29 DIAGNOSIS — R262 Difficulty in walking, not elsewhere classified: Secondary | ICD-10-CM | POA: Diagnosis not present

## 2022-09-29 DIAGNOSIS — M25561 Pain in right knee: Secondary | ICD-10-CM | POA: Diagnosis not present

## 2022-09-29 DIAGNOSIS — R5381 Other malaise: Secondary | ICD-10-CM | POA: Diagnosis not present

## 2022-09-30 DIAGNOSIS — M79672 Pain in left foot: Secondary | ICD-10-CM | POA: Diagnosis not present

## 2022-09-30 DIAGNOSIS — L539 Erythematous condition, unspecified: Secondary | ICD-10-CM | POA: Diagnosis not present

## 2022-09-30 NOTE — Progress Notes (Signed)
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   Pre-diabetes    Past Surgical History:  Procedure Laterality Date   BIOPSY  08/09/2022   Procedure: BIOPSY;  Surgeon: Shellia Cleverly, DO;  Location: [facility];  Service: Gastroenterology;;   COLONOSCOPY     FLEXIBLE SIGMOIDOSCOPY N/A 08/09/2022   Procedure: FLEXIBLE SIGMOIDOSCOPY;  Surgeon: Shellia Cleverly, DO;  Location: [facility];  Service: Gastroenterology;  Laterality: N/A;  propofol   INTRAMEDULLARY (IM) NAIL INTERTROCHANTERIC Left 06/11/2020   Procedure: INTRAMEDULLARY (IM) NAIL INTERTROCHANTRIC;  Surgeon: Roby Lofts, MD;  Location: [facility];  Service: Orthopedics;  Laterality: Left;   INTRAMEDULLARY (IM) NAIL INTERTROCHANTERIC Right 04/17/2022   Procedure: INTRAMEDULLARY (IM) NAIL INTERTROCHANTERIC;  Surgeon: London Sheer, MD;  Location: [facility];  Service: Orthopedics;  Laterality: Right;   KNEE ARTHROSCOPY Bilateral    POLYPECTOMY      Allergies  No Known Allergies   History of Present Illness    Frank Hernandez  is a 82 year old male with a PMH of bradycardia, HTN, history of bladder CA, multiple myeloma (s/p pathological fracture of the hip), CKD stage IIIa, pulmonary embolism (on Eliquis)  Frank Hernandez was initially seen by Dr. Elease Hashimoto in 2018 for complaint of near syncope. He wore a 30-Frank event monitor that showed pauses at night with heart rates  in the 30s and heart rates in the 140s during the Frank. He was referred to EP and was seen by Dr. Ladona Ridgel on 06/21/2016. Discussion was made for ILR but due to patient being asymptomatic decision was made to hold off and practice watchful waiting. He presented to the ED on 05/28/2022 with complaint of hypotension. He recently started treatment for multiple myeloma 3 weeks prior to his visit. He was noted to also be suffering from hypokalemia and low magnesium. EKG in the ED showed junctional rhythm with first-degree AVB.  He wore a 14-Frank ZIO monitor that showed predominantly sinus rhythm with asymptomatic sinus pauses occurring at night and rare PVCs.  He was seen in the ED on 07/29/2022 with complaint of shortness of breath and rhonchi.  He underwent CTA of the chest that showed subsegmental PE and patient was treated with Lovenox and started on Eliquis.  He was also treated for acute aspiration pneumonia that resolved and patient was discharged to skilled nursing facility with recommendation for PT/OT.  He was admitted 09/08/2022 due to severe hyperkalemia and metabolic acidosis in the setting of toxic encephalopathy.  In the ED patient's potassium was 7.5 and was treated aggressively with Lokelma and D50 with Lasix.  He had resolution of pararenal azotemia and was discharged on 09/14/2022 back to skilled nursing facility.  Since last being seen patient has been at [facility] after his recent hospitalization last month.  His blood pressure today was initially labile at 80/58 was 90/62 on recheck.  His heart rate was initially 56 bpm and was 70 bpm  on recheck.  O2 saturation was also increased from 90% to 99% on recheck.  He is currently tolerating his Eliquis without any bleeding concerns.  Today's visit we discussed the importance of maintaining good hydration.  He is scheduled to follow-up with oncologist next Friday and I advised him to discuss possible goals of care further at that time. When also discussed  the need to seek care in the ED if his B/P remains low and appetite continues to decline. He became tearful and appear to be overwhelmed due to his current health changes. Patient denies chest pain, palpitations, dyspnea, PND, orthopnea, nausea, vomiting, dizziness, syncope, edema, weight gain, or early satiety.   Home Medications    Current Outpatient Medications  Medication Sig Dispense Refill   acetaminophen (TYLENOL) 500 MG tablet Take 1,000 mg by mouth every 8 (eight) hours as needed for moderate pain.     Amino Acids-Protein Hydrolys (FEEDING SUPPLEMENT, PRO-STAT SUGAR FREE 64,) LIQD Take 30 mLs by mouth 2 (two) times daily.     ammonium lactate (LAC-HYDRIN) 12 % lotion Apply 1 Application topically See admin instructions. Apply to both feet and legs at bedtime     apixaban (ELIQUIS) 5 MG TABS tablet Take 1 tablet (5 mg total) by mouth 2 (two) times daily. 60 tablet 0   ferrous sulfate 325 (65 FE) MG tablet Take 1 tablet (325 mg total) by mouth 2 (two) times daily with a meal.  3   Multiple Vitamin (MULTIVITAMIN WITH MINERALS) TABS tablet Take 1 tablet by mouth daily. 30 tablet 0   pantoprazole (PROTONIX) 40 MG tablet Take 1 tablet (40 mg total) by mouth 2 (two) times daily. 60 tablet 0   Vitamin D, Ergocalciferol, (DRISDOL) 1.25 MG (50000 UNIT) CAPS capsule Take 1 capsule (50,000 Units total) by mouth once a week. (Patient taking differently: Take 50,000 Units by mouth every Friday.) 5 capsule 0   No current facility-administered medications for this visit.     Review of Systems  Please see the history of present illness.    (+) Weakness, weight loss (+) Fatigue, anxiety   All other systems reviewed and are otherwise negative except as noted above.  Physical Exam    Wt Readings from Last 3 Encounters:  09/14/22 180 lb 12.4 oz (82 kg)  08/07/22 223 lb 1.7 oz (101.2 kg)  07/16/22 199 lb 8 oz (90.5 kg)   WU:[id number] were no vitals filed for this visit.,There is no height or weight  on file to calculate BMI.  Constitutional:      Appearance: Ill appearance. Not in distress.  Neck:     Vascular: JVD normal.  Pulmonary:     Effort: Pulmonary effort is normal.     Breath sounds: No wheezing. No rales. Diminished in the bases Cardiovascular:     Normal rate. Regular rhythm. Normal S1. Normal S2.      Murmurs: There is no murmur.  Edema:    Peripheral edema absent.  Abdominal:     Palpations: Abdomen is soft non tender. There is no hepatomegaly.  Skin:    General: Skin is warm and dry.  Neurological:     General: No focal deficit present.     Mental Status: Alert and oriented to person, place and time.     Cranial Nerves: Cranial nerves are intact.  EKG/LABS/ Recent Cardiac Studies    ECG personally reviewed by me today -none completed today   Lab Results  Component Value Date   WBC 8.6 09/09/2022  HGB 9.3 (L) 09/09/2022   HCT 29.7 (L) 09/09/2022   MCV 95.8 09/09/2022   PLT 331 09/09/2022   Lab Results  Component Value Date   CREATININE 0.94 09/13/2022   BUN 13 09/13/2022   NA 141 09/13/2022   K 3.9 09/13/2022   CL 115 (H) 09/13/2022   CO2 20 (L) 09/13/2022   Lab Results  Component Value Date   ALT 20 09/08/2022   AST 35 09/08/2022   ALKPHOS 145 (H) 09/08/2022   BILITOT 0.5 09/08/2022   No results found for: "CHOL", "HDL", "LDLCALC", "LDLDIRECT", "TRIG", "CHOLHDL"  Lab Results  Component Value Date   HGBA1C 6.0 (H) 04/13/2022     Assessment & Plan    1.  Essential hypertension: -Patient's blood pressure today was initially labile at 80/58 and was 90/62 on recheck. -He was advised to check his blood pressures at home and should seek care in the ED if blood pressures followed below 85 systolically and 60 diastolically.  2.  History of multiple myeloma: -Patient is currently undergoing treatment and is followed by oncology and PCP -He has experienced frequent hospitalizations in addition to recent fracture and was encouraged to discuss  goals of care and possible palliative care referral at upcoming visit next Friday.  3.  History of pulmonary embolism: -History of subsegmental PE in the setting of aspiration pneumonia -Patient currently on Eliquis 5 mg twice daily -We will check BMET and CBC today  4.  Bradycardia: -Patient's heart rate today was 70 bpm and reports no recurrence of palpitations or arrhythmias since previous visit.  Disposition: Follow-up with None or APP in 3 months   Medication Adjustments/Labs and Tests Ordered: Current medicines are reviewed at length with the patient today.  Concerns regarding medicines are outlined above.   Signed, Napoleon Form, Leodis Rains, NP 09/30/2022, 10:59 AM [facility]

## 2022-10-01 ENCOUNTER — Encounter: Payer: Self-pay | Admitting: Nurse Practitioner

## 2022-10-01 ENCOUNTER — Ambulatory Visit: Payer: Medicare PPO | Admitting: Nurse Practitioner

## 2022-10-01 VITALS — BP 90/62 | HR 70 | Ht 69.5 in | Wt 180.0 lb

## 2022-10-01 DIAGNOSIS — I498 Other specified cardiac arrhythmias: Secondary | ICD-10-CM

## 2022-10-01 DIAGNOSIS — R29898 Other symptoms and signs involving the musculoskeletal system: Secondary | ICD-10-CM | POA: Diagnosis not present

## 2022-10-01 DIAGNOSIS — E1159 Type 2 diabetes mellitus with other circulatory complications: Secondary | ICD-10-CM | POA: Diagnosis not present

## 2022-10-01 DIAGNOSIS — I2699 Other pulmonary embolism without acute cor pulmonale: Secondary | ICD-10-CM

## 2022-10-01 DIAGNOSIS — C9000 Multiple myeloma not having achieved remission: Secondary | ICD-10-CM | POA: Diagnosis not present

## 2022-10-01 DIAGNOSIS — I10 Essential (primary) hypertension: Secondary | ICD-10-CM

## 2022-10-01 NOTE — Patient Instructions (Addendum)
Medication Instructions:  WAITING FOR A COPY OF MEDICATION LIST  *If you need a refill on your cardiac medications before your next appointment, please call your pharmacy*   Lab Work: TODAY-BMET & CBC If you have labs (blood work) drawn today and your tests are completely normal, you will receive your results only by: MyChart Message (if you have MyChart) OR A paper copy in the mail If you have any lab test that is abnormal or we need to change your treatment, we will call you to review the results.   Testing/Procedures: NONE ORDERED   Follow-Up: At [facility], you and your health needs are our priority.  As part of our continuing mission to provide you with exceptional heart care, we have created designated Provider Care Teams.  These Care Teams include your primary Cardiologist (physician) and Advanced Practice Providers (APPs -  Physician Assistants and Nurse Practitioners) who all work together to provide you with the care you need, when you need it.  We recommend signing up for the patient portal called "MyChart".  Sign up information is provided on this After Visit Summary.  MyChart is used to connect with patients for Virtual Visits (Telemedicine).  Patients are able to view lab/test results, encounter notes, upcoming appointments, etc.  Non-urgent messages can be sent to your provider as well.   To learn more about what you can do with MyChart, go to [email or web address].    Your next appointment:   3 month(s)  Provider:   Robin Searing, NP        Other Instructions DISCUSS PALLIATIVE CARE WITH ONCOLOGY DRINK AT LEAST 32-64 OUNCES OF WATER A DAY MONITOR BLOOD PRESSURE IF LOWER THAN 90/60 CONTACT OFFICE

## 2022-10-02 ENCOUNTER — Encounter: Payer: Self-pay | Admitting: Hematology

## 2022-10-02 ENCOUNTER — Other Ambulatory Visit: Payer: Self-pay

## 2022-10-04 DIAGNOSIS — C9000 Multiple myeloma not having achieved remission: Secondary | ICD-10-CM | POA: Diagnosis not present

## 2022-10-04 DIAGNOSIS — D631 Anemia in chronic kidney disease: Secondary | ICD-10-CM | POA: Diagnosis not present

## 2022-10-04 DIAGNOSIS — E872 Acidosis, unspecified: Secondary | ICD-10-CM | POA: Diagnosis not present

## 2022-10-04 DIAGNOSIS — N1831 Chronic kidney disease, stage 3a: Secondary | ICD-10-CM | POA: Diagnosis not present

## 2022-10-04 DIAGNOSIS — E860 Dehydration: Secondary | ICD-10-CM | POA: Diagnosis not present

## 2022-10-04 DIAGNOSIS — E875 Hyperkalemia: Secondary | ICD-10-CM | POA: Diagnosis not present

## 2022-10-04 DIAGNOSIS — I129 Hypertensive chronic kidney disease with stage 1 through stage 4 chronic kidney disease, or unspecified chronic kidney disease: Secondary | ICD-10-CM | POA: Diagnosis not present

## 2022-10-04 DIAGNOSIS — C679 Malignant neoplasm of bladder, unspecified: Secondary | ICD-10-CM | POA: Diagnosis not present

## 2022-10-04 DIAGNOSIS — I2609 Other pulmonary embolism with acute cor pulmonale: Secondary | ICD-10-CM | POA: Diagnosis not present

## 2022-10-05 DIAGNOSIS — C9000 Multiple myeloma not having achieved remission: Secondary | ICD-10-CM | POA: Diagnosis not present

## 2022-10-05 DIAGNOSIS — I129 Hypertensive chronic kidney disease with stage 1 through stage 4 chronic kidney disease, or unspecified chronic kidney disease: Secondary | ICD-10-CM | POA: Diagnosis not present

## 2022-10-05 DIAGNOSIS — E860 Dehydration: Secondary | ICD-10-CM | POA: Diagnosis not present

## 2022-10-05 DIAGNOSIS — N1831 Chronic kidney disease, stage 3a: Secondary | ICD-10-CM | POA: Diagnosis not present

## 2022-10-05 DIAGNOSIS — C679 Malignant neoplasm of bladder, unspecified: Secondary | ICD-10-CM | POA: Diagnosis not present

## 2022-10-05 DIAGNOSIS — D631 Anemia in chronic kidney disease: Secondary | ICD-10-CM | POA: Diagnosis not present

## 2022-10-05 DIAGNOSIS — E875 Hyperkalemia: Secondary | ICD-10-CM | POA: Diagnosis not present

## 2022-10-05 DIAGNOSIS — E872 Acidosis, unspecified: Secondary | ICD-10-CM | POA: Diagnosis not present

## 2022-10-05 DIAGNOSIS — I2609 Other pulmonary embolism with acute cor pulmonale: Secondary | ICD-10-CM | POA: Diagnosis not present

## 2022-10-06 DIAGNOSIS — M1611 Unilateral primary osteoarthritis, right hip: Secondary | ICD-10-CM | POA: Diagnosis not present

## 2022-10-06 DIAGNOSIS — M25551 Pain in right hip: Secondary | ICD-10-CM | POA: Diagnosis not present

## 2022-10-06 DIAGNOSIS — M25561 Pain in right knee: Secondary | ICD-10-CM | POA: Diagnosis not present

## 2022-10-07 DIAGNOSIS — I129 Hypertensive chronic kidney disease with stage 1 through stage 4 chronic kidney disease, or unspecified chronic kidney disease: Secondary | ICD-10-CM | POA: Diagnosis not present

## 2022-10-07 DIAGNOSIS — D631 Anemia in chronic kidney disease: Secondary | ICD-10-CM | POA: Diagnosis not present

## 2022-10-07 DIAGNOSIS — E875 Hyperkalemia: Secondary | ICD-10-CM | POA: Diagnosis not present

## 2022-10-07 DIAGNOSIS — E860 Dehydration: Secondary | ICD-10-CM | POA: Diagnosis not present

## 2022-10-07 DIAGNOSIS — E872 Acidosis, unspecified: Secondary | ICD-10-CM | POA: Diagnosis not present

## 2022-10-07 DIAGNOSIS — C9000 Multiple myeloma not having achieved remission: Secondary | ICD-10-CM | POA: Diagnosis not present

## 2022-10-07 DIAGNOSIS — I2609 Other pulmonary embolism with acute cor pulmonale: Secondary | ICD-10-CM | POA: Diagnosis not present

## 2022-10-07 DIAGNOSIS — C679 Malignant neoplasm of bladder, unspecified: Secondary | ICD-10-CM | POA: Diagnosis not present

## 2022-10-07 DIAGNOSIS — N1831 Chronic kidney disease, stage 3a: Secondary | ICD-10-CM | POA: Diagnosis not present

## 2022-10-08 ENCOUNTER — Telehealth: Payer: Self-pay

## 2022-10-08 ENCOUNTER — Inpatient Hospital Stay: Payer: Medicare PPO | Admitting: Dietician

## 2022-10-08 ENCOUNTER — Inpatient Hospital Stay: Payer: Medicare PPO

## 2022-10-08 ENCOUNTER — Other Ambulatory Visit: Payer: Self-pay

## 2022-10-08 ENCOUNTER — Inpatient Hospital Stay (HOSPITAL_COMMUNITY)
Admission: EM | Admit: 2022-10-08 | Discharge: 2022-10-22 | DRG: 641 | Disposition: A | Discharge status: Home Health | Payer: Medicare PPO | Source: Ambulatory Visit | Attending: Family Medicine | Admitting: Family Medicine

## 2022-10-08 ENCOUNTER — Inpatient Hospital Stay: Payer: Medicare PPO | Attending: Hematology

## 2022-10-08 ENCOUNTER — Inpatient Hospital Stay (HOSPITAL_BASED_OUTPATIENT_CLINIC_OR_DEPARTMENT_OTHER): Payer: Medicare PPO | Admitting: Hematology

## 2022-10-08 ENCOUNTER — Encounter (HOSPITAL_COMMUNITY): Payer: Self-pay | Admitting: Emergency Medicine

## 2022-10-08 VITALS — BP 106/81 | HR 90 | Temp 97.3°F | Resp 18 | Wt 178.7 lb

## 2022-10-08 DIAGNOSIS — E876 Hypokalemia: Secondary | ICD-10-CM | POA: Diagnosis present

## 2022-10-08 DIAGNOSIS — E8721 Acute metabolic acidosis: Secondary | ICD-10-CM | POA: Diagnosis present

## 2022-10-08 DIAGNOSIS — Z86711 Personal history of pulmonary embolism: Secondary | ICD-10-CM | POA: Diagnosis not present

## 2022-10-08 DIAGNOSIS — N1831 Chronic kidney disease, stage 3a: Secondary | ICD-10-CM | POA: Diagnosis present

## 2022-10-08 DIAGNOSIS — C9000 Multiple myeloma not having achieved remission: Secondary | ICD-10-CM

## 2022-10-08 DIAGNOSIS — C7951 Secondary malignant neoplasm of bone: Secondary | ICD-10-CM | POA: Diagnosis present

## 2022-10-08 DIAGNOSIS — R4589 Other symptoms and signs involving emotional state: Secondary | ICD-10-CM | POA: Diagnosis not present

## 2022-10-08 DIAGNOSIS — I10 Essential (primary) hypertension: Secondary | ICD-10-CM | POA: Diagnosis not present

## 2022-10-08 DIAGNOSIS — K529 Noninfective gastroenteritis and colitis, unspecified: Secondary | ICD-10-CM | POA: Diagnosis not present

## 2022-10-08 DIAGNOSIS — M1990 Unspecified osteoarthritis, unspecified site: Secondary | ICD-10-CM | POA: Diagnosis present

## 2022-10-08 DIAGNOSIS — D509 Iron deficiency anemia, unspecified: Secondary | ICD-10-CM | POA: Diagnosis present

## 2022-10-08 DIAGNOSIS — Z8551 Personal history of malignant neoplasm of bladder: Secondary | ICD-10-CM | POA: Insufficient documentation

## 2022-10-08 DIAGNOSIS — K5981 Ogilvie syndrome: Secondary | ICD-10-CM | POA: Diagnosis present

## 2022-10-08 DIAGNOSIS — R197 Diarrhea, unspecified: Secondary | ICD-10-CM | POA: Diagnosis not present

## 2022-10-08 DIAGNOSIS — E8581 Light chain (AL) amyloidosis: Secondary | ICD-10-CM | POA: Diagnosis present

## 2022-10-08 DIAGNOSIS — Z86718 Personal history of other venous thrombosis and embolism: Secondary | ICD-10-CM | POA: Insufficient documentation

## 2022-10-08 DIAGNOSIS — Z7901 Long term (current) use of anticoagulants: Secondary | ICD-10-CM | POA: Diagnosis not present

## 2022-10-08 DIAGNOSIS — D649 Anemia, unspecified: Secondary | ICD-10-CM | POA: Diagnosis not present

## 2022-10-08 DIAGNOSIS — M84451D Pathological fracture, right femur, subsequent encounter for fracture with routine healing: Secondary | ICD-10-CM | POA: Diagnosis present

## 2022-10-08 DIAGNOSIS — Z515 Encounter for palliative care: Secondary | ICD-10-CM

## 2022-10-08 DIAGNOSIS — I129 Hypertensive chronic kidney disease with stage 1 through stage 4 chronic kidney disease, or unspecified chronic kidney disease: Secondary | ICD-10-CM | POA: Diagnosis present

## 2022-10-08 DIAGNOSIS — Z79899 Other long term (current) drug therapy: Secondary | ICD-10-CM

## 2022-10-08 DIAGNOSIS — E860 Dehydration: Secondary | ICD-10-CM | POA: Diagnosis present

## 2022-10-08 DIAGNOSIS — I2699 Other pulmonary embolism without acute cor pulmonale: Secondary | ICD-10-CM | POA: Diagnosis not present

## 2022-10-08 DIAGNOSIS — K6389 Other specified diseases of intestine: Secondary | ICD-10-CM

## 2022-10-08 DIAGNOSIS — N179 Acute kidney failure, unspecified: Secondary | ICD-10-CM | POA: Diagnosis present

## 2022-10-08 DIAGNOSIS — R195 Other fecal abnormalities: Secondary | ICD-10-CM | POA: Diagnosis not present

## 2022-10-08 DIAGNOSIS — R7303 Prediabetes: Secondary | ICD-10-CM | POA: Diagnosis present

## 2022-10-08 DIAGNOSIS — I4891 Unspecified atrial fibrillation: Secondary | ICD-10-CM | POA: Diagnosis present

## 2022-10-08 DIAGNOSIS — I2609 Other pulmonary embolism with acute cor pulmonale: Secondary | ICD-10-CM | POA: Diagnosis not present

## 2022-10-08 DIAGNOSIS — Z5112 Encounter for antineoplastic immunotherapy: Secondary | ICD-10-CM | POA: Insufficient documentation

## 2022-10-08 DIAGNOSIS — R627 Adult failure to thrive: Secondary | ICD-10-CM | POA: Diagnosis not present

## 2022-10-08 DIAGNOSIS — E875 Hyperkalemia: Secondary | ICD-10-CM | POA: Diagnosis not present

## 2022-10-08 DIAGNOSIS — Z7189 Other specified counseling: Secondary | ICD-10-CM

## 2022-10-08 LAB — COMPREHENSIVE METABOLIC PANEL
ALT: 21 U/L (ref 0–44)
AST: 27 U/L (ref 15–41)
Albumin: 3.8 g/dL (ref 3.5–5.0)
Alkaline Phosphatase: 143 U/L — ABNORMAL HIGH (ref 38–126)
Anion gap: 14 (ref 5–15)
BUN: 21 mg/dL (ref 8–23)
CO2: 18 mmol/L — ABNORMAL LOW (ref 22–32)
Calcium: 7.6 mg/dL — ABNORMAL LOW (ref 8.9–10.3)
Chloride: 106 mmol/L (ref 98–111)
Creatinine, Ser: 1.54 mg/dL — ABNORMAL HIGH (ref 0.61–1.24)
GFR, Estimated: 45 mL/min — ABNORMAL LOW (ref >60)
Glucose, Bld: 100 mg/dL — ABNORMAL HIGH (ref 70–99)
Potassium: 2 mmol/L — CL (ref 3.5–5.1)
Sodium: 138 mmol/L (ref 135–145)
Total Bilirubin: 0.7 mg/dL (ref 0.3–1.2)
Total Protein: 6.9 g/dL (ref 6.5–8.1)

## 2022-10-08 LAB — CBC WITH DIFFERENTIAL (CANCER CENTER ONLY)
Abs Immature Granulocytes: 0.01 10*3/uL (ref 0.00–0.07)
Basophils Absolute: 0 10*3/uL (ref 0.0–0.1)
Basophils Relative: 0 %
Eosinophils Absolute: 0.1 10*3/uL (ref 0.0–0.5)
Eosinophils Relative: 2 %
HCT: 25.8 % — ABNORMAL LOW (ref 39.0–52.0)
Hemoglobin: 8.9 g/dL — ABNORMAL LOW (ref 13.0–17.0)
Immature Granulocytes: 0 %
Lymphocytes Relative: 17 %
Lymphs Abs: 1 10*3/uL (ref 0.7–4.0)
MCH: 29.6 pg (ref 26.0–34.0)
MCHC: 34.5 g/dL (ref 30.0–36.0)
MCV: 85.7 fL (ref 80.0–100.0)
Monocytes Absolute: 0.8 10*3/uL (ref 0.1–1.0)
Monocytes Relative: 14 %
Neutro Abs: 4.1 10*3/uL (ref 1.7–7.7)
Neutrophils Relative %: 67 %
Platelet Count: 375 10*3/uL (ref 150–400)
RBC: 3.01 MIL/uL — ABNORMAL LOW (ref 4.22–5.81)
RDW: 16.2 % — ABNORMAL HIGH (ref 11.5–15.5)
WBC Count: 6 10*3/uL (ref 4.0–10.5)
nRBC: 0 % (ref 0.0–0.2)

## 2022-10-08 LAB — CMP (CANCER CENTER ONLY)
ALT: 16 U/L (ref 0–44)
AST: 24 U/L (ref 15–41)
Albumin: 3.7 g/dL (ref 3.5–5.0)
Alkaline Phosphatase: 134 U/L — ABNORMAL HIGH (ref 38–126)
Anion gap: 13 (ref 5–15)
BUN: 19 mg/dL (ref 8–23)
CO2: 18 mmol/L — ABNORMAL LOW (ref 22–32)
Calcium: 7.3 mg/dL — ABNORMAL LOW (ref 8.9–10.3)
Chloride: 108 mmol/L (ref 98–111)
Creatinine: 1.46 mg/dL — ABNORMAL HIGH (ref 0.61–1.24)
GFR, Estimated: 48 mL/min — ABNORMAL LOW (ref >60)
Glucose, Bld: 98 mg/dL (ref 70–99)
Potassium: 2.3 mmol/L — CL (ref 3.5–5.1)
Sodium: 139 mmol/L (ref 135–145)
Total Bilirubin: 0.6 mg/dL (ref 0.3–1.2)
Total Protein: 6.1 g/dL — ABNORMAL LOW (ref 6.5–8.1)

## 2022-10-08 LAB — URINALYSIS, ROUTINE W REFLEX MICROSCOPIC
Bilirubin Urine: NEGATIVE
Glucose, UA: NEGATIVE mg/dL
Hgb urine dipstick: NEGATIVE
Ketones, ur: NEGATIVE mg/dL
Leukocytes,Ua: NEGATIVE
Nitrite: NEGATIVE
Protein, ur: NEGATIVE mg/dL
Specific Gravity, Urine: 1.006 (ref 1.005–1.030)
pH: 6 (ref 5.0–8.0)

## 2022-10-08 LAB — CBC
HCT: 29.2 % — ABNORMAL LOW (ref 39.0–52.0)
Hemoglobin: 9.8 g/dL — ABNORMAL LOW (ref 13.0–17.0)
MCH: 29.8 pg (ref 26.0–34.0)
MCHC: 33.6 g/dL (ref 30.0–36.0)
MCV: 88.8 fL (ref 80.0–100.0)
Platelets: 375 10*3/uL (ref 150–400)
RBC: 3.29 MIL/uL — ABNORMAL LOW (ref 4.22–5.81)
RDW: 16.5 % — ABNORMAL HIGH (ref 11.5–15.5)
WBC: 6.8 10*3/uL (ref 4.0–10.5)
nRBC: 0 % (ref 0.0–0.2)

## 2022-10-08 LAB — MAGNESIUM: Magnesium: 2 mg/dL (ref 1.7–2.4)

## 2022-10-08 LAB — LIPASE, BLOOD: Lipase: 22 U/L (ref 11–51)

## 2022-10-08 MED ORDER — MIRTAZAPINE 15 MG PO TABS
7.5000 mg | ORAL_TABLET | Freq: Every day | ORAL | Status: DC
  Administered 2022-10-09 – 2022-10-21 (×13): 7.5 mg via ORAL
  Filled 2022-10-08 (×13): qty 1

## 2022-10-08 MED ORDER — POTASSIUM CHLORIDE 20 MEQ PO PACK: 40.0000 meq | PACK | Freq: Once | ORAL | Status: DC

## 2022-10-08 MED ORDER — APIXABAN 5 MG PO TABS
5.0000 mg | ORAL_TABLET | Freq: Two times a day (BID) | ORAL | Status: DC
  Administered 2022-10-08 – 2022-10-19 (×22): 5 mg via ORAL
  Filled 2022-10-08 (×22): qty 1

## 2022-10-08 MED ORDER — POTASSIUM CHLORIDE CRYS ER 20 MEQ PO TBCR
40.0000 meq | EXTENDED_RELEASE_TABLET | Freq: Once | ORAL | Status: AC
  Filled 2022-10-08: qty 2

## 2022-10-08 MED ORDER — ACETAMINOPHEN 500 MG PO TABS
1000.0000 mg | ORAL_TABLET | Freq: Three times a day (TID) | ORAL | Status: DC | PRN
  Administered 2022-10-16 – 2022-10-19 (×2): 1000 mg via ORAL
  Filled 2022-10-08 (×2): qty 2

## 2022-10-08 MED ORDER — PANTOPRAZOLE SODIUM 40 MG PO TBEC
40.0000 mg | DELAYED_RELEASE_TABLET | Freq: Two times a day (BID) | ORAL | Status: DC
  Administered 2022-10-08 – 2022-10-12 (×8): 40 mg via ORAL
  Filled 2022-10-08 (×8): qty 1

## 2022-10-08 MED ORDER — POTASSIUM CHLORIDE CRYS ER 20 MEQ PO TBCR
40.0000 meq | EXTENDED_RELEASE_TABLET | Freq: Once | ORAL | Status: AC
  Administered 2022-10-08: 40 meq via ORAL
  Filled 2022-10-08: qty 2

## 2022-10-08 MED ORDER — FERROUS SULFATE 325 (65 FE) MG PO TABS
325.0000 mg | ORAL_TABLET | Freq: Two times a day (BID) | ORAL | Status: DC
  Administered 2022-10-09 – 2022-10-22 (×26): 325 mg via ORAL
  Filled 2022-10-08 (×26): qty 1

## 2022-10-08 MED ORDER — MAGNESIUM OXIDE -MG SUPPLEMENT 400 (240 MG) MG PO TABS
400.0000 mg | ORAL_TABLET | Freq: Once | ORAL | Status: AC
  Administered 2022-10-08: 400 mg via ORAL
  Filled 2022-10-08: qty 1

## 2022-10-08 MED ORDER — POTASSIUM CHLORIDE 10 MEQ/100ML IV SOLN
10.0000 meq | INTRAVENOUS | Status: AC
  Administered 2022-10-08 – 2022-10-09 (×4): 10 meq via INTRAVENOUS
  Filled 2022-10-08 (×4): qty 100

## 2022-10-08 MED ORDER — LACTATED RINGERS IV SOLN: INTRAVENOUS | Status: AC

## 2022-10-08 NOTE — ED Provider Notes (Signed)
[name] Provider Note   CSN: [id number] Arrival date & time: 10/08/22  1624     History  Chief Complaint  Patient presents with   Abnormal Lab    Frank Hernandez is a 82 y.o. male with PMH as listed below who presents due to a potassium level of 2.4. His MD told him to come here. Patient is a cancer center patient. He also reports having diarrhea x 1 week. Patient is on chemotherapy for MM, . No nausea/vomiting, abdominal pain. 2 episodes of diarrhea per day, watery and soft. No hematochezia or coffee ground stool. No fevers/chills. No CP, SOB. On daratumumab/velcade/dexamethasone. Has multiple bony meds to pelvis and spine. Also h/o normocytic anemia on IV iron infusions. Has h/o DVT/PE and aspiration PNA on 08/16/22, placed on eliquis. Then hospitalized from 7/10-7/16/24 for FTT/AMS found to have severe hyperkalemia, AKI, metabolic acidosis, DC'd back to SNF.    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 Authorizing Provider  acetaminophen (TYLENOL) 500 MG tablet Take 1,000 mg by mouth every 8 (eight) hours as needed for moderate pain.   Yes [provider]  apixaban (ELIQUIS) 5 MG TABS tablet Take 1 tablet (5 mg total) by mouth 2 (two) times daily. 08/16/22  Yes Azucena Fallen, MD  ferrous sulfate 325 (65 FE) MG tablet Take 1 tablet (325 mg total) by mouth 2 (two) times daily with a meal. 06/01/22  Yes Rodolph Bong, MD  mirtazapine (REMERON) 7.5 MG tablet Take 7.5  mg by mouth at bedtime. 10/01/22  Yes [provider]  pantoprazole (PROTONIX) 40 MG tablet Take 1 tablet (40 mg total) by mouth 2 (two) times daily. 08/16/22  Yes Azucena Fallen, MD  Vitamin D, Ergocalciferol, (DRISDOL) 1.25 MG (50000 UNIT) CAPS capsule Take 1 capsule (50,000 Units total) by mouth once a week. Patient taking differently: Take 50,000 Units by mouth once a week. tuesday 07/09/22  Yes Johney Maine, MD      Allergies    Patient has no known allergies.    Review of Systems   Review of Systems A 10 point review of systems was performed and is negative unless otherwise reported in HPI.  Physical Exam Updated Vital Signs BP 128/81 (BP Location: Left Arm)   Pulse 70   Temp 98.2 F (36.8 C) (Oral)   Resp 14   Ht 5\' 9"  (1.753 m)   Wt 81.6 kg   SpO2 100%   BMI 26.58 kg/m  Physical Exam General: Normal appearing male, lying in bed.  HEENT: Sclera anicteric, MMM, trachea midline.  Cardiology: RRR, no murmurs/rubs/gallops.  Resp: Normal respiratory rate and effort. CTAB, no wheezes, rhonchi, crackles.  Abd: Soft, non-tender, non-distended. No rebound tenderness or guarding.  GU: Deferred. MSK: No peripheral edema or signs of trauma.  Skin: warm, dry.  Neuro: A&Ox4, CNs II-XII grossly intact. MAEs. Sensation grossly intact.  Psych: Normal mood and affect.   ED Results / Procedures / Treatments  Labs (all labs ordered are listed, but only abnormal results are displayed) Labs Reviewed  COMPREHENSIVE METABOLIC PANEL - Abnormal; Notable for the following components:      Result Value   Potassium 2.0 (*)    CO2 18 (*)    Glucose, Bld 100 (*)    Creatinine, Ser 1.54 (*)    Calcium 7.6 (*)    Alkaline Phosphatase 143 (*)    GFR, Estimated 45 (*)    All other components within normal limits  CBC - Abnormal; Notable for the following components:   RBC 3.29 (*)    Hemoglobin 9.8 (*)    HCT 29.2 (*)    RDW 16.5 (*)    All other components within  normal limits  LIPASE, BLOOD  URINALYSIS, ROUTINE W REFLEX MICROSCOPIC  MAGNESIUM    EKG EKG Interpretation Date/Time:  Friday October 08 2022 19:45:43 EDT Ventricular Rate:  64 PR Interval:    QRS Duration:  89 QT Interval:  440 QTC Calculation: 454 R Axis:   3  Text Interpretation: Atrial fibrillation Abnormal R-wave progression, early transition Borderline ST depression, anterior leads Confirmed by Vivi Barrack [phone]) on 10/08/2022 8:36:10 PM  Radiology No results found.  Procedures Procedures    Medications Ordered in ED Medications  potassium chloride 10 mEq in 100 mL IVPB (10 mEq Intravenous New Bag/Given 10/08/22 2226)  lactated ringers infusion ( Intravenous New Bag/Given 10/08/22 2224)  potassium chloride SA (KLOR-CON M) CR tablet 40 mEq (40 mEq Oral Given 10/08/22 2221)  magnesium oxide (MAG-OX) tablet 400 mg (400 mg Oral Given 10/08/22 2221)    ED Course/ Medical Decision Making/ A&P                          Medical Decision Making Amount and/or Complexity of Data Reviewed Labs: ordered. Decision-making details documented in ED Course.  Risk OTC drugs. Decision regarding hospitalization.    This patient presents to the ED for concern of hypokalemia, this involves an extensive number of treatment options, and is a complaint that carries with it a high risk of complications and morbidity.  I considered the following differential and admission for this acute, potentially life threatening condition.   MDM:    Pt with report of hypokalemia, likely d/t diarrhea persistently. Has dealt with diarrhea chronically. Had recent admission for severe hyperkalemia. He has no other complaints except diarrhea. He is afebrile, no vital sign abnormalities to indicate severe hypovolemia.  He states he is only having 2 episodes of bowel movements per day, lower concern for C. Difficile or acute infectious diarrhea.  Could also be related to his chemotherapy.  Also consider poor p.o.  intake possible dehydration.  Patient is ultimately found to have K of 2.0 with possible EKG changes including ST depressions and T wave flattening as well as an AKI.  He is amenable to be admitted to the hospital for p.o. and IV potassium repletion with recheck in the morning.  Patient is admitted to the hospitalist.  Clinical Course as of 10/08/22 2305  [facility] Oct 08, 2022  1851 Hemoglobin(!): 9.8 At approximate baseline [HN]  1934 Potassium(!!): 2.0 Very low potassium - ordered repletion [HN]  1934 Lipase: 22 neg [HN]  1934 Creatinine(!): 1.54 +AKI [HN]  2036 EKG w/ afib, which patient does have history of, and possible ST depression or T wave flattening, though has had low voltage T waves in the past but not on most recent EKGs. Possibly d/t  hypokalemia. [HN]    Clinical Course User Index [HN] Loetta Rough, MD    Labs: I Ordered, and personally interpreted labs.  The pertinent results include:  those listed above   Additional history obtained from chart review.    Cardiac Monitoring: The patient was maintained on a cardiac monitor.  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  He was recently admitted for hyperkalemia and was taken off potassium supplementation. -Administer IV potassium x 6, oral 40 mEq      Diarrhea                This note was created using dictation software, which may contain spelling or grammatical errors.    Loetta Rough, MD 10/08/22 [id number]

## 2022-10-08 NOTE — Assessment & Plan Note (Signed)
-   Diagnosed 08/16/2022.  Continue Eliquis.

## 2022-10-08 NOTE — Telephone Encounter (Signed)
CRITICAL VALUE STICKER  CRITICAL VALUE:  Potassium 2.3  RECEIVER (on-site recipient of call): Daneil Dolin, LPN  DATE & TIME NOTIFIED: 10/08/22  14:49  MESSENGER (representative from lab):Myriam Jacobson  MD NOTIFIED: Wyvonnia Lora, MD  TIME OF NOTIFICATION: 14:51  RESPONSE:

## 2022-10-08 NOTE — Assessment & Plan Note (Signed)
-  Follows with oncology Dr. Candise Che on Daratumumab

## 2022-10-08 NOTE — Assessment & Plan Note (Signed)
-   Potassium of 2 likely secondary to chronic diarrhea.  He was recently admitted for hyperkalemia and was taken off potassium supplementation. -Administer IV potassium x 6, oral 40 mEq

## 2022-10-08 NOTE — ED Notes (Signed)
ED TO INPATIENT HANDOFF REPORT  Name/Age/Gender Frank Hernandez 82 y.o. male  Code Status Code Status History     Date Active Date Inactive Code Status Order ID Comments User Context   09/08/2022 1936 09/14/2022 1835 Full Code [id number]  Buena Irish, MD ED   07/29/2022 1204 08/16/2022 1628 Full Code [id number]  Bobette Mo, MD ED   05/28/2022 1110 06/01/2022 2315 Full Code [id number]  Maryln Gottron, MD ED   04/13/2022 1309 04/23/2022 0203 Full Code [id number]  Elease Etienne, MD ED   03/30/2022 0130 04/05/2022 2058 Full Code [id number]  Gery Pray, MD ED   06/10/2020 2243 06/25/2020 0330 Full Code [id number]  Kinsinger, De Blanch, MD ED   12/26/2015 1513 12/27/2015 2120 Full Code [id number]  Black, Lesle Chris, NP ED    Questions for Most Recent Historical Code Status (Order [id number])     Question Answer   By: Consent: discussion documented in EHR            Home/SNF/Other Home  Chief Complaint Hypokalemia [E87.6]  Level of Care/Admitting Diagnosis ED Disposition     ED Disposition  Admit   Condition  --   Comment  Hospital Area: [facility] [[id number]]  Level of Care: Telemetry [5]  Admit to tele based on following criteria: Monitor QTC interval  May place patient in observation at [facility] or [facility] if equivalent level of care is available:: No  Covid Evaluation: Asymptomatic - no recent exposure (last 10 days) testing not required  Diagnosis: Hypokalemia [172180]  Admitting Physician: Anselm Jungling [[id number]]  Attending Physician: Anselm Jungling [[id number]]          Medical History 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 03/29/22  2140  -- 193   Pressure Injury 05/28/22 Heel Right Unstageable - Full thickness tissue loss in which the base of the injury is covered by slough (yellow, tan, gray, green or brown) and/or eschar (tan, brown or black) in the wound bed. Open wound, broken from pressure & 05/28/22  1400  -- 133   Pressure Injury 08/08/22 Buttocks Right;Left Deep Tissue Pressure Injury - Purple or maroon localized area of discolored intact skin or blood-filled blister due to damage of underlying soft tissue from pressure and/or shear. 08/08/22  1241  -- 61   Wound / Incision (Open or Dehisced) Heel Left --  --  Heel  --   Wound / Incision (Open or Dehisced) 08/04/22 (MARSI) Medical Adhesive-Related Skin Injury Scrotum Right;Left excoriation stage 1 08/04/22  1600  Scrotum  65            Labs/Imaging Results for orders placed or performed during the hospital encounter of 10/08/22 (from the past 48 hour(s))  Lipase, blood     Status: None   Collection  Time: 10/08/22  6:22 PM  Result Value Ref Range   Lipase 22 11 - 51 U/L    Comment: Performed at [facility], [address], [place], [place] [address]  Comprehensive metabolic panel     Status: Abnormal   Collection Time: 10/08/22  6:22 PM  Result Value Ref Range   Sodium 138 135 - 145 mmol/L   Potassium 2.0 (LL) 3.5 - 5.1 mmol/L    Comment: CRITICAL RESULT CALLED TO, READ BACK BY AND VERIFIED WITH WHITLEY,J AT 1908 ON 10/08/2022 BY LUZOLOP    Chloride 106 98 - 111 mmol/L   CO2 18 (L) 22 - 32 mmol/L   Glucose, Bld 100 (H) 70 - 99 mg/dL    Comment: Glucose  reference range applies only to samples taken after fasting for at least 8 hours.   BUN 21 8 - 23 mg/dL   Creatinine, Ser 2.13 (H) 0.61 - 1.24 mg/dL   Calcium 7.6 (L) 8.9 - 10.3 mg/dL   Total Protein 6.9 6.5 - 8.1 g/dL   Albumin 3.8 3.5 - 5.0 g/dL   AST 27 15 - 41 U/L   ALT 21 0 - 44 U/L   Alkaline Phosphatase 143 (H) 38 - 126 U/L   Total Bilirubin 0.7 0.3 - 1.2 mg/dL   GFR, Estimated 45 (L) >60 mL/min    Comment: (NOTE) Calculated using the CKD-EPI Creatinine Equation (2021)    Anion gap 14 5 - 15    Comment: Performed at [facility], [address], [place], [place] [address]  CBC     Status: Abnormal   Collection Time: 10/08/22  6:22 PM  Result Value Ref Range   WBC 6.8 4.0 - 10.5 K/uL   RBC 3.29 (L) 4.22 - 5.81 MIL/uL   Hemoglobin 9.8 (L) 13.0 - 17.0 g/dL   HCT 84.6 (L) 96.2 - 95.2 %   MCV 88.8 80.0 - 100.0 fL   MCH 29.8 26.0 - 34.0 pg   MCHC 33.6 30.0 - 36.0 g/dL   RDW 84.1 (H) 32.4 - 40.1 %   Platelets 375 150 - 400 K/uL   nRBC 0.0 0.0 - 0.2 %    Comment: Performed at [facility], [address], [place], [place] [address]  Urinalysis, Routine w reflex microscopic -Urine, Clean Catch     Status: None   Collection Time: 10/08/22  8:33 PM  Result Value Ref Range   Color, Urine YELLOW YELLOW   APPearance CLEAR CLEAR   Specific Gravity, Urine 1.006 1.005 - 1.030   pH 6.0 5.0 - 8.0   Glucose, UA NEGATIVE NEGATIVE mg/dL   Hgb urine dipstick NEGATIVE NEGATIVE   Bilirubin Urine NEGATIVE NEGATIVE   Ketones, ur NEGATIVE NEGATIVE mg/dL   Protein, ur NEGATIVE NEGATIVE mg/dL   Nitrite NEGATIVE NEGATIVE   Leukocytes,Ua NEGATIVE NEGATIVE    Comment: Performed at [facility], [address], [place], [place] [address]   No results found.  Pending Labs Unresulted Labs (From admission, onward)     Start     Ordered   10/08/22 1852  Magnesium  Add-on,   AD        10/08/22 1851            Vitals/Pain Today's  Vitals   10/08/22 1704 10/08/22 1808 10/08/22 1948  BP: 106/66  135/67  Pulse: 71  (!) 59  Resp: 18  19  Temp: 98.1 F (36.7 C)    TempSrc: Oral  SpO2: 100%  100%  Weight: 81.6 kg    Height: 5\' 9"  (1.753 m)    PainSc:  0-No pain     Isolation Precautions No active isolations  Medications Medications  potassium chloride SA (KLOR-CON M) CR tablet 40 mEq (has no administration in time range)  potassium chloride 10 mEq in 100 mL IVPB (has no administration in time range)  magnesium oxide (MAG-OX) tablet 400 mg (has no administration in time range)  lactated ringers infusion (has no administration in time range)    Mobility non-ambulatory

## 2022-10-08 NOTE — Assessment & Plan Note (Signed)
-   Creatinine elevated to 1.54 with baseline around 1 - Keep on IV fluid hydration follow-up creatinine in the morning

## 2022-10-08 NOTE — ED Notes (Signed)
IV team was contacted

## 2022-10-08 NOTE — Progress Notes (Signed)
Scheduled to see pt in infusion. Pt sent to ED from clinic for further evaluation. Nutrition appointment cancelled. Will reschedule as able.

## 2022-10-08 NOTE — ED Triage Notes (Addendum)
Patient presents due to a potassium level of 2.4. His MD told him to come here. Patient is a cancer center patient. He also reports having diarrhea.

## 2022-10-08 NOTE — ED Notes (Signed)
Pt informed of need for urine sample. Urinal at bedside.

## 2022-10-08 NOTE — H&P (Signed)
History and Physical    Patient: Frank Hernandez NFA:[id number] DOB: 1940-12-28 DOA: 10/08/2022 DOS: the patient was seen and examined on 10/08/2022 PCP: [name] (Inactive)  Patient coming from: SNF  Chief Complaint:  Chief Complaint  Patient presents with   Abnormal Lab   HPI: Frank Hernandez is a 82 y.o. 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    He has follow today with oncology and noted to have hypokalemia and was sent to ED.   In the ED, he was afebrile normotensive on room air.  Potassium of 2.3, sodium of 139, CO2 of 18 and creatinine of 1.46 from prior up around 1.  No leukocytosis, stable hemoglobin 8.9 close to his baseline.  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   Pre-diabetes    Past Surgical History:  Procedure Laterality Date   BIOPSY  08/09/2022   Procedure: BIOPSY;  Surgeon: Shellia Cleverly, DO;  Location: [facility];  Service: Gastroenterology;;   COLONOSCOPY     FLEXIBLE SIGMOIDOSCOPY N/A 08/09/2022   Procedure: FLEXIBLE SIGMOIDOSCOPY;  Surgeon: Shellia Cleverly, DO;  Location: [facility];  Service: Gastroenterology;  Laterality: N/A;  propofol   INTRAMEDULLARY (IM) NAIL INTERTROCHANTERIC Left 06/11/2020   Procedure: INTRAMEDULLARY (IM)  NAIL INTERTROCHANTRIC;  Surgeon: Roby Lofts, MD;  Location: [facility] OR;  Service: Orthopedics;  Laterality: Left;   INTRAMEDULLARY (IM) NAIL INTERTROCHANTERIC Right 04/17/2022   Procedure: INTRAMEDULLARY (IM) NAIL INTERTROCHANTERIC;  Surgeon: London Sheer, MD;  Location: [facility] ORS;  Service: Orthopedics;  Laterality: Right;   KNEE ARTHROSCOPY Bilateral    POLYPECTOMY     Social History:  reports that he has never smoked. He has never used smokeless tobacco. He reports that he does not drink alcohol and does not use drugs.  No Known Allergies  Family History  Problem Relation Age of Onset   Colon cancer Neg Hx     Prior to Admission medications   Medication Sig Start Date End Date Taking? Authorizing Provider  acetaminophen (TYLENOL) 500 MG tablet Take 1,000 mg by mouth every 8 (eight) hours as needed for moderate pain.    [provider]  Amino Acids-Protein Hydrolys (FEEDING SUPPLEMENT, PRO-STAT SUGAR FREE 64,) LIQD Take 30 mLs by mouth 2 (two) times daily. Patient not taking: Reported on 10/08/2022    [provider]  ammonium lactate (LAC-HYDRIN) 12 % lotion Apply 1 Application topically See admin instructions. Apply to both feet and legs at bedtime    [provider]  apixaban (ELIQUIS) 5 MG TABS tablet Take 1 tablet (5 mg total) by mouth 2 (two) times daily. 08/16/22   Azucena Fallen, MD  ferrous sulfate 325 (65 FE) MG tablet Take 1 tablet (325 mg total) by mouth 2 (two) times daily with a meal. 06/01/22   Rodolph Bong, MD  mirtazapine (  REMERON) 7.5 MG tablet Take 7.5 mg by mouth at bedtime. 10/01/22   [provider]  Multiple Vitamin (MULTIVITAMIN WITH MINERALS) TABS tablet Take 1 tablet by mouth daily. Patient not taking: Reported on 10/08/2022 08/16/22   Azucena Fallen, MD  pantoprazole (PROTONIX) 40 MG tablet Take 1 tablet (40 mg total) by mouth 2 (two) times daily. 08/16/22   Azucena Fallen, MD  Vitamin D, Ergocalciferol,  (DRISDOL) 1.25 MG (50000 UNIT) CAPS capsule Take 1 capsule (50,000 Units total) by mouth once a week. Patient taking differently: Take 50,000 Units by mouth every Friday. 07/09/22   Johney Maine, MD    Physical Exam: Vitals:   10/08/22 1704 10/08/22 1948 10/08/22 2158  BP: 106/66 135/67 118/63  Pulse: 71 (!) 59 60  Resp: 18 19 18   Temp: 98.1 F (36.7 C)  97.7 F (36.5 C)  TempSrc: Oral  Oral  SpO2: 100% 100% 100%  Weight: 81.6 kg    Height: 5\' 9"  (1.753 m)     *** Data Reviewed: {Tip this will not be part of the note when signed- Document your independent interpretation of telemetry tracing, EKG, lab, Radiology test or any other diagnostic tests. Add any new diagnostic test ordered today. (Optional):26781} {Results:26384}  Assessment and Plan: No notes have been filed under this hospital service. Service: Hospitalist     Advance Care Planning:   Code Status: Prior ***  Consults: ***  Family Communication: ***  Severity of Illness: {Observation/Inpatient:21159}  Author: Anselm Jungling, DO 10/08/2022 10:05 PM  For on call review [email or web address].

## 2022-10-08 NOTE — H&P (Incomplete)
History and Physical    Patient: Frank Hernandez HQI:[id number] DOB: [birth year] DOA: 10/08/2022 DOS: the patient was seen and examined on 10/08/2022 PCP: [name] (Inactive)  [facility] from: [facility]  Chief Complaint:  Chief Complaint  Patient presents with  . Abnormal Lab   HPI: Frank Hernandez is a 82 y.o. 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    He had follow up today with oncology and noted to have hypokalemia and was sent to ED.   He was recently discharged on 7/16 for hyperkalemia, AKI from dehydration and chronic diarrhea.Also was on potassium supplementation that was discontinued at discharge.    Has been having once daily diarrhea for at least the past month. Decrease appetite. No abdominal pain, nausea or vomiting. No fever.  In June his GI pathogen was positive for enteropathogenic E.coli and completed azithromycin for 3 weeks. 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 GI did flex sig 6/10 howed scattered areas of mildly edematous mucosa in the proximal sigmoid colon, descending colon and transverse colon, biopsied and pathology showing hyperplastic changes. GI was awaiting for staining to rule out amyloidosis but unclear if that was ever done. GI signed off with PRN follow up recommended.   In the ED, he was afebrile normotensive on room air.  Potassium of 2.3, sodium of 139, CO2 of 18 and creatinine of 1.46 from prior up around 1. No leukocytosis, stable hemoglobin 8.9 close to his baseline.  Multiple dose of IV potassium were ordered and hospitalist consulted for admission.   Review of Systems: {ROS_Text:26778} Past Medical History:  Diagnosis Date  . Acute pulmonary embolism (HCC) 07/29/2022  . Bladder  cancer metastasized to bone (HCC) 05/28/2022  . Bleeding from the nose   . Cellulitis of both lower extremities 07/29/2022  . Chronic kidney disease, stage 3a (HCC) 04/03/2022  . Hypertension   . 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 Pre-diabetes    Past Surgical History:  Procedure Laterality Date  . BIOPSY  08/09/2022   Procedure: BIOPSY;  Surgeon: Shellia Cleverly, DO;  Location: [facility];  Service: Gastroenterology;;  . COLONOSCOPY    . FLEXIBLE SIGMOIDOSCOPY N/A 08/09/2022   Procedure: FLEXIBLE SIGMOIDOSCOPY;  Surgeon: Shellia Cleverly, DO;  Location: [facility];  Service: Gastroenterology;  Laterality: N/A;  propofol  . INTRAMEDULLARY (IM) NAIL INTERTROCHANTERIC Left 06/11/2020   Procedure: INTRAMEDULLARY (IM) NAIL INTERTROCHANTRIC;  Surgeon: Roby Lofts, MD;  Location: [facility];  Service: Orthopedics;  Laterality: Left;  . INTRAMEDULLARY (IM) NAIL INTERTROCHANTERIC Right 04/17/2022   Procedure: INTRAMEDULLARY (IM) NAIL INTERTROCHANTERIC;  Surgeon: London Sheer, MD;  Location: [facility];  Service: Orthopedics;  Laterality: Right;  . KNEE ARTHROSCOPY Bilateral   . POLYPECTOMY     Social History:  reports that he has never smoked. He has never used smokeless tobacco. He reports that he does not drink alcohol and does not use drugs.  No Known Allergies  Family History  Problem Relation Age of Onset  . Colon cancer Neg Hx     Prior to Admission medications   Medication Sig Start Date End Date Taking? Authorizing Provider  acetaminophen (TYLENOL) 500 MG tablet Take 1,000 mg by mouth every 8 (  eight) hours as needed for moderate pain.    [provider]  Amino Acids-Protein Hydrolys (FEEDING SUPPLEMENT, PRO-STAT SUGAR FREE 64,) LIQD Take 30 mLs by mouth 2 (two) times daily. Patient not taking: Reported on 10/08/2022    [provider]  ammonium lactate (LAC-HYDRIN) 12 % lotion Apply 1 Application  topically See admin instructions. Apply to both feet and legs at bedtime    [provider]  apixaban (ELIQUIS) 5 MG TABS tablet Take 1 tablet (5 mg total) by mouth 2 (two) times daily. 08/16/22   Azucena Fallen, MD  ferrous sulfate 325 (65 FE) MG tablet Take 1 tablet (325 mg total) by mouth 2 (two) times daily with a meal. 06/01/22   Rodolph Bong, MD  mirtazapine (REMERON) 7.5 MG tablet Take 7.5 mg by mouth at bedtime. 10/01/22   [provider]  Multiple Vitamin (MULTIVITAMIN WITH MINERALS) TABS tablet Take 1 tablet by mouth daily. Patient not taking: Reported on 10/08/2022 08/16/22   Azucena Fallen, MD  pantoprazole (PROTONIX) 40 MG tablet Take 1 tablet (40 mg total) by mouth 2 (two) times daily. 08/16/22   Azucena Fallen, MD  Vitamin D, Ergocalciferol, (DRISDOL) 1.25 MG (50000 UNIT) CAPS capsule Take 1 capsule (50,000 Units total) by mouth once a week. Patient taking differently: Take 50,000 Units by mouth every Friday. 07/09/22   Johney Maine, MD    Physical Exam: Vitals:   10/08/22 1704 10/08/22 1948 10/08/22 2158  BP: 106/66 135/67 118/63  Pulse: 71 (!) 59 60  Resp: 18 19 18   Temp: 98.1 F (36.7 C)  97.7 F (36.5 C)  TempSrc: Oral  Oral  SpO2: 100% 100% 100%  Weight: 81.6 kg    Height: 5\' 9"  (1.753 m)     *** Data Reviewed: {Tip this will not be part of the note when signed- Document your independent interpretation of telemetry tracing, EKG, lab, Radiology test or any other diagnostic tests. Add any new diagnostic test ordered today. (Optional):26781} {Results:26384}  Assessment and Plan: No notes have been filed under this hospital service. Service: Hospitalist     Advance Care Planning:   Code Status: Prior ***  Consults: ***  Family Communication: ***  Severity of Illness: {Observation/Inpatient:21159}  Author: Anselm Jungling, DO 10/08/2022 10:05 PM  For on call review [email or web address].
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
## 2022-10-09 DIAGNOSIS — C7951 Secondary malignant neoplasm of bone: Secondary | ICD-10-CM | POA: Diagnosis present

## 2022-10-09 DIAGNOSIS — E875 Hyperkalemia: Secondary | ICD-10-CM | POA: Diagnosis not present

## 2022-10-09 DIAGNOSIS — Z86718 Personal history of other venous thrombosis and embolism: Secondary | ICD-10-CM | POA: Diagnosis not present

## 2022-10-09 DIAGNOSIS — I129 Hypertensive chronic kidney disease with stage 1 through stage 4 chronic kidney disease, or unspecified chronic kidney disease: Secondary | ICD-10-CM | POA: Diagnosis present

## 2022-10-09 DIAGNOSIS — Z7901 Long term (current) use of anticoagulants: Secondary | ICD-10-CM | POA: Diagnosis not present

## 2022-10-09 DIAGNOSIS — E876 Hypokalemia: Secondary | ICD-10-CM | POA: Diagnosis present

## 2022-10-09 DIAGNOSIS — E860 Dehydration: Secondary | ICD-10-CM | POA: Diagnosis present

## 2022-10-09 DIAGNOSIS — M1990 Unspecified osteoarthritis, unspecified site: Secondary | ICD-10-CM | POA: Diagnosis present

## 2022-10-09 DIAGNOSIS — I4891 Unspecified atrial fibrillation: Secondary | ICD-10-CM | POA: Diagnosis present

## 2022-10-09 DIAGNOSIS — E8721 Acute metabolic acidosis: Secondary | ICD-10-CM | POA: Diagnosis present

## 2022-10-09 DIAGNOSIS — N179 Acute kidney failure, unspecified: Secondary | ICD-10-CM | POA: Diagnosis present

## 2022-10-09 DIAGNOSIS — I2609 Other pulmonary embolism with acute cor pulmonale: Secondary | ICD-10-CM | POA: Diagnosis not present

## 2022-10-09 DIAGNOSIS — I2699 Other pulmonary embolism without acute cor pulmonale: Secondary | ICD-10-CM | POA: Diagnosis not present

## 2022-10-09 DIAGNOSIS — M84451D Pathological fracture, right femur, subsequent encounter for fracture with routine healing: Secondary | ICD-10-CM | POA: Diagnosis present

## 2022-10-09 DIAGNOSIS — R197 Diarrhea, unspecified: Secondary | ICD-10-CM | POA: Diagnosis not present

## 2022-10-09 DIAGNOSIS — Z8551 Personal history of malignant neoplasm of bladder: Secondary | ICD-10-CM | POA: Diagnosis not present

## 2022-10-09 DIAGNOSIS — Z515 Encounter for palliative care: Secondary | ICD-10-CM | POA: Diagnosis not present

## 2022-10-09 DIAGNOSIS — C9000 Multiple myeloma not having achieved remission: Secondary | ICD-10-CM | POA: Diagnosis present

## 2022-10-09 DIAGNOSIS — D509 Iron deficiency anemia, unspecified: Secondary | ICD-10-CM | POA: Diagnosis present

## 2022-10-09 DIAGNOSIS — K529 Noninfective gastroenteritis and colitis, unspecified: Secondary | ICD-10-CM | POA: Diagnosis not present

## 2022-10-09 DIAGNOSIS — R7303 Prediabetes: Secondary | ICD-10-CM | POA: Diagnosis present

## 2022-10-09 DIAGNOSIS — E8581 Light chain (AL) amyloidosis: Secondary | ICD-10-CM | POA: Diagnosis present

## 2022-10-09 DIAGNOSIS — Z86711 Personal history of pulmonary embolism: Secondary | ICD-10-CM | POA: Diagnosis not present

## 2022-10-09 DIAGNOSIS — N1831 Chronic kidney disease, stage 3a: Secondary | ICD-10-CM | POA: Diagnosis present

## 2022-10-09 DIAGNOSIS — Z79899 Other long term (current) drug therapy: Secondary | ICD-10-CM | POA: Diagnosis not present

## 2022-10-09 DIAGNOSIS — K5981 Ogilvie syndrome: Secondary | ICD-10-CM | POA: Diagnosis present

## 2022-10-09 LAB — COMPREHENSIVE METABOLIC PANEL
ALT: 17 U/L (ref 0–44)
AST: 22 U/L (ref 15–41)
Albumin: 3.2 g/dL — ABNORMAL LOW (ref 3.5–5.0)
Alkaline Phosphatase: 123 U/L (ref 38–126)
Anion gap: 11 (ref 5–15)
BUN: 16 mg/dL (ref 8–23)
CO2: 19 mmol/L — ABNORMAL LOW (ref 22–32)
Calcium: 7.6 mg/dL — ABNORMAL LOW (ref 8.9–10.3)
Chloride: 111 mmol/L (ref 98–111)
Creatinine, Ser: 1.22 mg/dL (ref 0.61–1.24)
GFR, Estimated: 60 mL/min — ABNORMAL LOW (ref >60)
Glucose, Bld: 118 mg/dL — ABNORMAL HIGH (ref 70–99)
Potassium: 2.5 mmol/L — CL (ref 3.5–5.1)
Sodium: 141 mmol/L (ref 135–145)
Total Bilirubin: 0.6 mg/dL (ref 0.3–1.2)
Total Protein: 5.9 g/dL — ABNORMAL LOW (ref 6.5–8.1)

## 2022-10-09 LAB — PHOSPHORUS: Phosphorus: <1 mg/dL — CL (ref 2.5–4.6)

## 2022-10-09 LAB — CBC
HCT: 28.5 % — ABNORMAL LOW (ref 39.0–52.0)
Hemoglobin: 9.2 g/dL — ABNORMAL LOW (ref 13.0–17.0)
MCH: 30.2 pg (ref 26.0–34.0)
MCHC: 32.3 g/dL (ref 30.0–36.0)
MCV: 93.4 fL (ref 80.0–100.0)
Platelets: 320 10*3/uL (ref 150–400)
RBC: 3.05 MIL/uL — ABNORMAL LOW (ref 4.22–5.81)
RDW: 16.9 % — ABNORMAL HIGH (ref 11.5–15.5)
WBC: 6.7 10*3/uL (ref 4.0–10.5)
nRBC: 0 % (ref 0.0–0.2)

## 2022-10-09 LAB — C DIFFICILE QUICK SCREEN W PCR REFLEX
C Diff antigen: NEGATIVE
C Diff interpretation: NOT DETECTED
C Diff toxin: NEGATIVE

## 2022-10-09 LAB — MAGNESIUM: Magnesium: 1.6 mg/dL — ABNORMAL LOW (ref 1.7–2.4)

## 2022-10-09 MED ORDER — MAGNESIUM OXIDE -MG SUPPLEMENT 400 (240 MG) MG PO TABS
800.0000 mg | ORAL_TABLET | Freq: Two times a day (BID) | ORAL | Status: DC
  Administered 2022-10-09 – 2022-10-10 (×3): 800 mg via ORAL
  Filled 2022-10-09 (×3): qty 2

## 2022-10-09 MED ORDER — POTASSIUM CHLORIDE CRYS ER 20 MEQ PO TBCR
40.0000 meq | EXTENDED_RELEASE_TABLET | Freq: Three times a day (TID) | ORAL | Status: AC
  Administered 2022-10-09 – 2022-10-10 (×4): 40 meq via ORAL
  Filled 2022-10-09 (×4): qty 2

## 2022-10-09 MED ORDER — POTASSIUM PHOSPHATES 15 MMOLE/5ML IV SOLN
30.0000 mmol | Freq: Once | INTRAVENOUS | Status: AC
  Administered 2022-10-09: 30 mmol via INTRAVENOUS
  Filled 2022-10-09 (×2): qty 10

## 2022-10-09 MED ORDER — LACTATED RINGERS IV SOLN: INTRAVENOUS | Status: AC

## 2022-10-09 NOTE — Assessment & Plan Note (Signed)
Stable. Continue iron supplementation. 

## 2022-10-09 NOTE — Assessment & Plan Note (Signed)
Normotensive.  Not on antihypertensives

## 2022-10-09 NOTE — Progress Notes (Signed)
PROGRESS NOTE    Frank Hernandez  NWG:[id number] DOB: [birth year] DOA: 10/08/2022 PCP: [name] (Inactive)    Brief Narrative:  82 year old with history of multiple myeloma, multiple bony metastatic lesion, normocytic anemia, CKD stage IIIa, DVT and PE on Eliquis presents from cancer center with severe ongoing hypokalemia.  Patient does have history of intractable diarrhea, recently admitted for hyperkalemia and AKI in the context of chronic diarrhea.  He used to be on potassium supplement that was discontinued on last discharge.  Patient was recently diagnosed with enteropathogenic E. coli and he completed azithromycin therapy but continues to have diarrhea.  Flexible sigmoidoscopy on 6/10 with no definitive pathology.  In the emergency room potassium 2.3.  Repeat potassium persistently low, magnesium low, phosphorus less than 1.   Assessment & Plan:   Electrolytes Severe persistent hypokalemia, replaced aggressively overnight but persistently low.  Continue IV potassium replacement, aggressive oral potassium replacement and monitor levels. Hypomagnesemia, replace aggressively. Hypophosphatemia, replace through IV and monitor levels.  Chronic unrelenting diarrhea: Recently diagnosed enteropathogenic E. coli and completed 3 weeks of azithromycin.  Primary cause likely due to a mild lordosis of the bowels. Retest C. difficile and GI pathogen panel.  Continue enteric precautions.  If C. difficile is negative, will use Imodium.  Continue maintenance IV fluids. Diarrhea may be related to immunotherapy that he is on.  History of PE, Eliquis continued.  AKI on CKD stage IIIa: Maintenance IV fluids.  Improving.   DVT prophylaxis:  apixaban (ELIQUIS) tablet 5 mg   Code Status: Full code Family Communication: Wife on the phone Disposition Plan: Status is: Observation The patient will require care spanning > 2 midnights and should be moved to inpatient because: Severe critical  electrolyte deficiencies on IV replacement     Consultants:  None  Procedures:  None  Antimicrobials:  None   Subjective: Patient seen and examined.  Poor historian.  Patient tells me that anything that he eats goes right away as of diarrhea.  Noted to have more than 5 loose stool since today morning.  Denies any nausea but appetite is poor.  He has not walked in a while.  Called and discussed with patient's wife on the phone and updated.  She is worried about his early discharge without adequate improvement.  Objective: Vitals:   10/09/22 0300 10/09/22 0622 10/09/22 1044 10/09/22 1332  BP: 125/62 (!) 140/63 (!) 116/57 (!) 140/61  Pulse: 61 (!) 59 60 (!) 57  Resp: 14 14 16 16   Temp: 98.7 F (37.1 C) 98.4 F (36.9 C) 98.3 F (36.8 C) 98.2 F (36.8 C)  TempSrc: Oral Oral Oral Oral  SpO2: 99% 98% 100% 99%  Weight:      Height:        Intake/Output Summary (Last 24 hours) at 10/09/2022 1456 Last data filed at 10/09/2022 1332 Gross per 24 hour  Intake 240 ml  Output 725 ml  Net -485 ml   Filed Weights   10/08/22 1704 10/08/22 2249  Weight: 81.6 kg 80.7 kg    Examination:  General exam: Appears calm and comfortable  Frail and debilitated.  Chronically sick looking.  Not in any distress. Respiratory system: Clear to auscultation. Respiratory effort normal.  No added sounds. Cardiovascular system: S1 & S2 heard, RRR. Gastrointestinal system: Soft.  Nontender.  Bowel sound present. Central nervous system: Alert and oriented. No focal neurological deficits.  Gross generalized weakness.  Flat affect.    Data Reviewed: I have personally reviewed following labs and  imaging studies  CBC: Recent Labs  Lab 10/08/22 1349 10/08/22 1822 10/09/22 1106  WBC 6.0 6.8 6.7  NEUTROABS 4.1  --   --   HGB 8.9* 9.8* 9.2*  HCT 25.8* 29.2* 28.5*  MCV 85.7 88.8 93.4  PLT 375 375 320   Basic Metabolic Panel: Recent Labs  Lab 10/08/22 1349 10/08/22 1822 10/08/22 2000  10/09/22 1106  NA 139 138  --  141  K 2.3* 2.0*  --  2.5*  CL 108 106  --  111  CO2 18* 18*  --  19*  GLUCOSE 98 100*  --  118*  BUN 19 21  --  16  CREATININE 1.46* 1.54*  --  1.22  CALCIUM 7.3* 7.6*  --  7.6*  MG  --   --  2.0 1.6*  PHOS  --   --   --  <1.0*   GFR: Estimated Creatinine Clearance: 47.5 mL/min (by C-G formula based on SCr of 1.22 mg/dL). Liver Function Tests: Recent Labs  Lab 10/08/22 1349 10/08/22 1822 10/09/22 1106  AST 24 27 22   ALT 16 21 17   ALKPHOS 134* 143* 123  BILITOT 0.6 0.7 0.6  PROT 6.1* 6.9 5.9*  ALBUMIN 3.7 3.8 3.2*   Recent Labs  Lab 10/08/22 1822  LIPASE 22   No results for input(s): "AMMONIA" in the last 168 hours. Coagulation Profile: No results for input(s): "INR", "PROTIME" in the last 168 hours. Cardiac Enzymes: No results for input(s): "CKTOTAL", "CKMB", "CKMBINDEX", "TROPONINI" in the last 168 hours. BNP (last 3 results) No results for input(s): "PROBNP" in the last 8760 hours. HbA1C: No results for input(s): "HGBA1C" in the last 72 hours. CBG: No results for input(s): "GLUCAP" in the last 168 hours. Lipid Profile: No results for input(s): "CHOL", "HDL", "LDLCALC", "TRIG", "CHOLHDL", "LDLDIRECT" in the last 72 hours. Thyroid Function Tests: No results for input(s): "TSH", "T4TOTAL", "FREET4", "T3FREE", "THYROIDAB" in the last 72 hours. Anemia Panel: No results for input(s): "VITAMINB12", "FOLATE", "FERRITIN", "TIBC", "IRON", "RETICCTPCT" in the last 72 hours. Sepsis Labs: No results for input(s): "PROCALCITON", "LATICACIDVEN" in the last 168 hours.  No results found for this or any previous visit (from the past 240 hour(s)).       Radiology Studies: No results found.      Scheduled Meds:  apixaban  5 mg Oral BID   ferrous sulfate  325 mg Oral BID WC   magnesium oxide  800 mg Oral BID   mirtazapine  7.5 mg Oral QHS   pantoprazole  40 mg Oral BID   potassium chloride  40 mEq Oral TID   Continuous  Infusions:  lactated ringers 100 mL/hr at 10/09/22 1303   potassium PHOSPHATE IVPB (in mmol) 30 mmol (10/09/22 1451)     LOS: 0 days    Time spent: 40 minutes    Dorcas Carrow, MD [facility]

## 2022-10-09 NOTE — Assessment & Plan Note (Signed)
-  ongoing diarrhea for months -In June his GI pathogen was positive for enteropathogenic E.coli and completed azithromycin for 3 weeks. 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  -GI did flex sig 6/10 howed scattered areas of mildly edematous mucosa in the proximal sigmoid colon, descending colon and transverse colon, biopsied and pathology showing hyperplastic changes. GI was awaiting for staining to rule out amyloidosis but unclear if that was ever done. GI signed off with PRN follow up recommended at the time -will retest for C.diff and stool panel

## 2022-10-09 NOTE — Plan of Care (Signed)

## 2022-10-10 DIAGNOSIS — Z515 Encounter for palliative care: Secondary | ICD-10-CM

## 2022-10-10 DIAGNOSIS — E876 Hypokalemia: Secondary | ICD-10-CM

## 2022-10-10 LAB — GASTROINTESTINAL PANEL BY PCR, STOOL (REPLACES STOOL CULTURE)

## 2022-10-10 LAB — COMPREHENSIVE METABOLIC PANEL WITH GFR
ALT: 14 U/L (ref 0–44)
AST: 22 U/L (ref 15–41)
Albumin: 2.8 g/dL — ABNORMAL LOW (ref 3.5–5.0)
Alkaline Phosphatase: 107 U/L (ref 38–126)
Anion gap: 11 (ref 5–15)
BUN: 11 mg/dL (ref 8–23)
CO2: 19 mmol/L — ABNORMAL LOW (ref 22–32)
Calcium: 7.5 mg/dL — ABNORMAL LOW (ref 8.9–10.3)
Chloride: 112 mmol/L — ABNORMAL HIGH (ref 98–111)
Creatinine, Ser: 0.99 mg/dL (ref 0.61–1.24)
GFR, Estimated: >60 mL/min (ref >60)
Glucose, Bld: 88 mg/dL (ref 70–99)
Potassium: 3.1 mmol/L — ABNORMAL LOW (ref 3.5–5.1)
Sodium: 142 mmol/L (ref 135–145)
Total Bilirubin: 0.6 mg/dL (ref 0.3–1.2)
Total Protein: 5.4 g/dL — ABNORMAL LOW (ref 6.5–8.1)

## 2022-10-10 LAB — CBC WITH DIFFERENTIAL/PLATELET
Abs Immature Granulocytes: 0.04 10*3/uL (ref 0.00–0.07)
Basophils Absolute: 0 10*3/uL (ref 0.0–0.1)
Basophils Relative: 0 %
Eosinophils Absolute: 0.4 10*3/uL (ref 0.0–0.5)
Eosinophils Relative: 5 %
HCT: 23.5 % — ABNORMAL LOW (ref 39.0–52.0)
Hemoglobin: 8 g/dL — ABNORMAL LOW (ref 13.0–17.0)
Immature Granulocytes: 0 %
Lymphocytes Relative: 10 %
Lymphs Abs: 0.9 10*3/uL (ref 0.7–4.0)
MCH: 30 pg (ref 26.0–34.0)
MCHC: 34 g/dL (ref 30.0–36.0)
MCV: 88 fL (ref 80.0–100.0)
Monocytes Absolute: 1.1 10*3/uL — ABNORMAL HIGH (ref 0.1–1.0)
Monocytes Relative: 12 %
Neutro Abs: 6.5 10*3/uL (ref 1.7–7.7)
Neutrophils Relative %: 73 %
Platelets: 321 10*3/uL (ref 150–400)
RBC: 2.67 MIL/uL — ABNORMAL LOW (ref 4.22–5.81)
RDW: 16.8 % — ABNORMAL HIGH (ref 11.5–15.5)
WBC: 8.9 10*3/uL (ref 4.0–10.5)
nRBC: 0 % (ref 0.0–0.2)

## 2022-10-10 LAB — MAGNESIUM: Magnesium: 1.5 mg/dL — ABNORMAL LOW (ref 1.7–2.4)

## 2022-10-10 LAB — PHOSPHORUS: Phosphorus: 1.9 mg/dL — ABNORMAL LOW (ref 2.5–4.6)

## 2022-10-10 MED ORDER — LACTATED RINGERS IV SOLN: INTRAVENOUS | Status: AC

## 2022-10-10 MED ORDER — LOPERAMIDE HCL 2 MG PO CAPS
2.0000 mg | ORAL_CAPSULE | ORAL | Status: DC | PRN
  Administered 2022-10-11: 2 mg via ORAL
  Filled 2022-10-10: qty 1

## 2022-10-10 MED ORDER — POTASSIUM CHLORIDE CRYS ER 20 MEQ PO TBCR
40.0000 meq | EXTENDED_RELEASE_TABLET | Freq: Three times a day (TID) | ORAL | Status: AC
  Administered 2022-10-10 – 2022-10-11 (×3): 40 meq via ORAL
  Filled 2022-10-10 (×3): qty 2

## 2022-10-10 MED ORDER — MAGNESIUM SULFATE 4 GM/100ML IV SOLN
4.0000 g | Freq: Once | INTRAVENOUS | Status: AC
  Administered 2022-10-10: 4 g via INTRAVENOUS
  Filled 2022-10-10: qty 100

## 2022-10-10 MED ORDER — POTASSIUM PHOSPHATES 15 MMOLE/5ML IV SOLN
30.0000 mmol | Freq: Once | INTRAVENOUS | Status: AC
  Administered 2022-10-10: 30 mmol via INTRAVENOUS
  Filled 2022-10-10: qty 10

## 2022-10-10 NOTE — Consult Note (Signed)
Palliative Medicine  Name: Frank Hernandez Date: 10/10/2022 MRN: [id number]  DOB: [birth year]  Patient Care Team: [name] (Inactive) as PCP - General (Surgery) Nahser, Deloris Ping, MD as Consulting Physician (Cardiology)    REASON FOR CONSULTATION: Frank Hernandez is a 82 y.o. male with multiple medical problems including multiple myeloma previously on treatment with Lynwood Dawley CyBorD with last treatment 07/09/2022, previous pathologic right hip fracture, AL amyloidosis, remote history of bladder cancer, hypertension, CKD, DVT/PE, history of aspiration pneumonia.  Patient has been readmitted to the hospital monthly since January 2024.  Most recently, he was hospitalized 07/29/2022 to 08/16/2022 with a bowel obstruction with GI concerned about Ogilvie syndrome. He was also found to have DVT and PE and was started on Eliquis.  Patient has had chronic diarrhea for many months and was stool positive for enteropathic E. coli and completed several weeks of azithromycin.  There was concern for possible AL amyloid of the bowels but Congo red stain was negative.  Patient was again hospitalized 09/08/2022 to 09/14/2022 with AKI and severe hyperkalemia, which was treated with Lokelma.  Patient discharged to rehab and was ultimately transition to home.  He presented to oncology office on 10/08/2022 and was found to have severe hypokalemia leading to patient being readmitted for same.  Palliative care was consulted to address goals.  SOCIAL HISTORY:     reports that he has never smoked. He has never used smokeless tobacco. He reports that he does not drink alcohol and does not use drugs.  Patient is married and lives at home with his wife and son.  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   Pre-diabetes     PAST SURGICAL HISTORY:  Past Surgical History:  Procedure Laterality Date   BIOPSY  08/09/2022   Procedure: BIOPSY;  Surgeon: Shellia Cleverly, DO;  Location: [facility];  Service: Gastroenterology;;   COLONOSCOPY     FLEXIBLE SIGMOIDOSCOPY N/A 08/09/2022   Procedure: FLEXIBLE SIGMOIDOSCOPY;  Surgeon: Shellia Cleverly, DO;  Location: [facility];  Service: Gastroenterology;  Laterality: N/A;  propofol   INTRAMEDULLARY (IM) NAIL INTERTROCHANTERIC Left 06/11/2020   Procedure: INTRAMEDULLARY (IM) NAIL INTERTROCHANTRIC;  Surgeon: Roby Lofts, MD;  Location: [facility];  Service: Orthopedics;  Laterality: Left;   INTRAMEDULLARY (IM) NAIL INTERTROCHANTERIC Right 04/17/2022   Procedure: INTRAMEDULLARY (IM) NAIL INTERTROCHANTERIC;  Surgeon: London Sheer, MD;  Location: [facility];  Service: Orthopedics;  Laterality: Right;   KNEE ARTHROSCOPY Bilateral    POLYPECTOMY      HEMATOLOGY/ONCOLOGY HISTORY:  Oncology History  Multiple myeloma not having achieved remission (HCC)  04/21/2022 Initial Diagnosis   Multiple myeloma not having achieved remission (HCC)   04/30/2022 -  Chemotherapy   Patient is on Treatment Plan : PRIMARY AMYLOIDOSIS DaraCyBorD (Daratumumab SQ + Cyclophosphamide PO + Bortezomib SQ + Dexamethasone PO/IV) q28d x 6 cycles / Daratumumab SQ q28d       ALLERGIES:  has No Known Allergies.  MEDICATIONS:  Current Facility-Administered Medications  Medication  Dose Route Frequency Provider Last Rate Last Admin   acetaminophen (TYLENOL) tablet 1,000 mg  1,000 mg Oral Q8H PRN Tu, Ching T, DO       apixaban (ELIQUIS) tablet 5 mg  5 mg Oral BID Tu, Ching T, DO   5 mg at 10/10/22 1047   ferrous sulfate  tablet 325 mg  325 mg Oral BID WC Tu, Ching T, DO   325 mg at 10/10/22 1047   lactated ringers infusion   Intravenous Continuous Dorcas Carrow, MD 100 mL/hr at 10/09/22 1303 New Bag at 10/09/22 1303   magnesium oxide (MAG-OX) tablet 800 mg  800 mg Oral BID Dorcas Carrow, MD   800 mg at 10/10/22 1047   mirtazapine (REMERON) tablet 7.5 mg  7.5 mg Oral QHS Tu, Ching T, DO   7.5 mg at 10/09/22 2120   pantoprazole (PROTONIX) EC tablet 40 mg  40 mg Oral BID Tu, Ching T, DO   40 mg at 10/10/22 1047   Facility-Administered Medications Ordered in Other Encounters  Medication Dose Route Frequency Provider Last Rate Last Admin   potassium chloride SA (KLOR-CON M) CR tablet 40 mEq  40 mEq Oral Once Johney Maine, MD        VITAL SIGNS: BP (!) 159/90 (BP Location: Left Arm)   Pulse 84   Temp 98.9 F (37.2 C) (Oral)   Resp 18   Ht 5\' 9"  (1.753 m)   Wt 177 lb 14.6 oz (80.7 kg)   SpO2 100%   BMI 26.27 kg/m  Filed Weights   10/08/22 1704 10/08/22 2249  Weight: 180 lb (81.6 kg) 177 lb 14.6 oz (80.7 kg)    Estimated body mass index is 26.27 kg/m as calculated from the following:   Height as of this encounter: 5\' 9"  (1.753 m).   Weight as of this encounter: 177 lb 14.6 oz (80.7 kg).  LABS: CBC:    Component Value Date/Time   WBC 8.9 10/10/2022 0611   HGB 8.0 (L) 10/10/2022 0611   HGB 8.9 (L) 10/08/2022 1349   HGB 9.1 (L) 10/01/2022 1143   HCT 23.5 (L) 10/10/2022 0611   HCT 28.6 (L) 10/01/2022 1143   PLT 321 10/10/2022 0611   PLT 375 10/08/2022 1349   PLT 423 10/01/2022 1143   MCV 88.0 10/10/2022 0611   MCV 90 10/01/2022 1143   NEUTROABS 6.5 10/10/2022 0611   LYMPHSABS 0.9 10/10/2022 0611   MONOABS 1.1 (H) 10/10/2022 0611   EOSABS 0.4 10/10/2022 0611   BASOSABS 0.0 10/10/2022 0611   Comprehensive Metabolic Panel:    Component Value Date/Time   NA 142 10/10/2022 0611   NA 140 10/01/2022 1143   K 3.1 (L) 10/10/2022 0611   CL 112 (H) 10/10/2022 0611   CO2 19 (L) 10/10/2022  0611   BUN 11 10/10/2022 0611   BUN 30 (H) 10/01/2022 1143   CREATININE 0.99 10/10/2022 0611   CREATININE 1.46 (H) 10/08/2022 1349   GLUCOSE 88 10/10/2022 0611   CALCIUM 7.5 (L) 10/10/2022 0611   AST 22 10/10/2022 0611   AST 24 10/08/2022 1349   ALT 14 10/10/2022 0611   ALT 16 10/08/2022 1349   ALKPHOS 107 10/10/2022 0611   BILITOT 0.6 10/10/2022 0611   BILITOT 0.6 10/08/2022 1349   PROT 5.4 (L) 10/10/2022 0611   ALBUMIN 2.8 (L) 10/10/2022 2440    RADIOGRAPHIC STUDIES: No results found.  PERFORMANCE STATUS (ECOG) : 3 - Symptomatic, >50% confined to bed  Review of Systems Unless  otherwise noted, a complete review of systems is negative.  Physical Exam General: NAD Cardiovascular: regular rate and rhythm Pulmonary: clear ant fields Abdomen: soft, nontender, + bowel sounds, rectal tube GU: no suprapubic tenderness Extremities: no edema, no joint deformities Skin: no rashes Neurological: Weakness, some word searching  IMPRESSION: Patient with multiple medical problems including multiple myeloma, AL amyloidosis, chronic diarrhea, and multiple recent hospitalizations, now readmitted with hypokalemia.  Patient off [facility] Prima since May 2024.  I met with patient and then spoke with his wife by phone.  Both admit that patient has been doing poorly for many months.  Patient has been in and out of the hospital.  Most recently, he was home after a stay at rehab.  At baseline, patient is mostly in the chair but is able to ambulate some with use of a walker.  Wife has to assist him with bathing, dressing, and toileting.  Appetite has been chronically poor.  Patient and wife would like to speak with oncology regarding plan for cancer treatment.  They understand that his poor performance status and overall frailty may exclude cancer treatment.  We had a candid conversation regarding the possible option of hospice to provide him supportive care at home.   We discussed CODE STATUS.   Initially, patient tearfully expressed a desire not to be resuscitated but then changed his mind.  I encouraged patient and wife to discuss decision making.  Symptomatically, he denies pain but has ongoing diarrhea currently requiring a rectal tube.  Wife had tried as needed Imodium at home but not recently.  Recommend trial of Lomotil if GI panel is negative.  Noted that Congo red stain was negative in June suggesting etiology is not amyloidosis.  Consider GI consultation.  PLAN: -Continue current scope of treatment -Patient/family are considering decision making -Family would like to speak with oncology regarding plan for treatment versus hospice -Consider referral to GI for workup of chronic diarrhea -Antidiarrheals if GI panel is negative -Add fecal calprotectin -Will follow  Case and plan discussed with Dr. Sharolyn Douglas and Dr. Linna Darner  Time Total: 70 minutes  Visit consisted of counseling and education dealing with the complex and emotionally intense issues of symptom management and palliative care in the setting of serious and potentially life-threatening illness.Greater than 50%  of this time was spent counseling and coordinating care related to the above assessment and plan.  Signed by: Laurette Schimke, PhD, NP-C

## 2022-10-10 NOTE — Progress Notes (Signed)
PROGRESS NOTE    Frank Hernandez  ZOX:[id number] DOB: [birth year] DOA: 10/08/2022 PCP: [name] (Inactive)    Brief Narrative:  82 year old with history of multiple myeloma, multiple bony metastatic lesion, normocytic anemia, CKD stage IIIa, DVT and PE on Eliquis presents from cancer center with severe ongoing hypokalemia.  Patient does have history of intractable diarrhea, recently admitted for hyperkalemia and AKI in the context of chronic diarrhea. Patient was recently diagnosed with enteropathogenic E. coli and he completed azithromycin therapy X 3 weeks, but continues to have diarrhea.  Flexible sigmoidoscopy on 6/10 with no definitive pathology. In the ED, noted multiple electrolyte imbalance.  Patient admitted for further management.     Assessment & Plan:   Chronic intractable diarrhea Recent enteropathogenic E. coli s/p 3 weeks of azithromycin Suspicion for amyloidosis of loose bowels but the Congo red stain was negative.  Possibly immunotherapy related C. difficile, GI pathogen panel all negative Fecal calprotectin pending Consider GI consultation Continue rectal tube, imodium as needed Continue IV fluids Monitor closely  Multiple electrolyte imbalance Hypokalemia, hypomagnesemia, hypophosphatemia Continue aggressive repletion as needed   History of PE Eliquis continued, question absorption May need therapeutic Lovenox at this point in time, will defer to heme-onc  Normocytic anemia Iron deficiency anemia Baseline hemoglobin around 9 Continue oral iron supplementation  AKI on CKD stage IIIa Improving Continue IV fluids Daily BMP  History of multiple myeloma Metastatic bony lesion Follows with Dr. Candise Che, added to the treatment team On immunotherapy  Goals of care discussion Palliative consulted, patient full code Family would like to speak with oncology regarding treatment plan versus hospice   DVT prophylaxis:  apixaban (ELIQUIS) tablet 5 mg    Code Status: Full code Family Communication: None at bedside Disposition Plan: Status is: Inpatient  The patient will require care spanning > 2 midnights and should be moved to inpatient because: Severe electrolyte deficiencies on IV replacement     Consultants:  Palliative/hospice team  Procedures:  None  Antimicrobials:  None   Subjective: Pt reports poor appetite, rectal tube in place, still putting out dark stool. Denies any abdominal pain, fever/chills, N/V, chest pain, fever/chills.     Objective: Vitals:   10/09/22 1332 10/09/22 2141 10/10/22 0453 10/10/22 1350  BP: (!) 140/61 (!) 140/62 (!) 159/90 (!) 151/69  Pulse: (!) 57 60 84 60  Resp: 16 18 18    Temp: 98.2 F (36.8 C) 99.2 F (37.3 C) 98.9 F (37.2 C) 98.1 F (36.7 C)  TempSrc: Oral Oral Oral Oral  SpO2: 99% 100% 100% 100%  Weight:      Height:        Intake/Output Summary (Last 24 hours) at 10/10/2022 1425 Last data filed at 10/10/2022 1053 Gross per 24 hour  Intake 756.53 ml  Output 2950 ml  Net -2193.47 ml   Filed Weights   10/08/22 1704 10/08/22 2249  Weight: 81.6 kg 80.7 kg    Examination:  General: NAD, elderly Cardiovascular: S1, S2 present Respiratory: CTAB Abdomen: Soft, nontender, nondistended, bowel sounds present Musculoskeletal: Trace bilateral pedal edema noted Skin: Chronic venous stasis changes B/l Psychiatry: Normal mood     Data Reviewed: I have personally reviewed following labs and imaging studies  CBC: Recent Labs  Lab 10/08/22 1349 10/08/22 1822 10/09/22 1106 10/10/22 0611  WBC 6.0 6.8 6.7 8.9  NEUTROABS 4.1  --   --  6.5  HGB 8.9* 9.8* 9.2* 8.0*  HCT 25.8* 29.2* 28.5* 23.5*  MCV 85.7 88.8 93.4 88.0  PLT  375 375 320 321   Basic Metabolic Panel: Recent Labs  Lab 10/08/22 1349 10/08/22 1822 10/08/22 2000 10/09/22 1106 10/10/22 0611  NA 139 138  --  141 142  K 2.3* 2.0*  --  2.5* 3.1*  CL 108 106  --  111 112*  CO2 18* 18*  --  19* 19*  GLUCOSE  98 100*  --  118* 88  BUN 19 21  --  16 11  CREATININE 1.46* 1.54*  --  1.22 0.99  CALCIUM 7.3* 7.6*  --  7.6* 7.5*  MG  --   --  2.0 1.6* 1.5*  PHOS  --   --   --  <1.0* 1.9*   GFR: Estimated Creatinine Clearance: 58.5 mL/min (by C-G formula based on SCr of 0.99 mg/dL). Liver Function Tests: Recent Labs  Lab 10/08/22 1349 10/08/22 1822 10/09/22 1106 10/10/22 0611  AST 24 27 22 22   ALT 16 21 17 14   ALKPHOS 134* 143* 123 107  BILITOT 0.6 0.7 0.6 0.6  PROT 6.1* 6.9 5.9* 5.4*  ALBUMIN 3.7 3.8 3.2* 2.8*   Recent Labs  Lab 10/08/22 1822  LIPASE 22   No results for input(s): "AMMONIA" in the last 168 hours. Coagulation Profile: No results for input(s): "INR", "PROTIME" in the last 168 hours. Cardiac Enzymes: No results for input(s): "CKTOTAL", "CKMB", "CKMBINDEX", "TROPONINI" in the last 168 hours. BNP (last 3 results) No results for input(s): "PROBNP" in the last 8760 hours. HbA1C: No results for input(s): "HGBA1C" in the last 72 hours. CBG: No results for input(s): "GLUCAP" in the last 168 hours. Lipid Profile: No results for input(s): "CHOL", "HDL", "LDLCALC", "TRIG", "CHOLHDL", "LDLDIRECT" in the last 72 hours. Thyroid Function Tests: No results for input(s): "TSH", "T4TOTAL", "FREET4", "T3FREE", "THYROIDAB" in the last 72 hours. Anemia Panel: No results for input(s): "VITAMINB12", "FOLATE", "FERRITIN", "TIBC", "IRON", "RETICCTPCT" in the last 72 hours. Sepsis Labs: No results for input(s): "PROCALCITON", "LATICACIDVEN" in the last 168 hours.  Recent Results (from the past 240 hour(s))  C Difficile Quick Screen w PCR reflex     Status: None   Collection Time: 10/09/22  2:10 PM   Specimen: STOOL  Result Value Ref Range Status   C Diff antigen NEGATIVE NEGATIVE Final   C Diff toxin NEGATIVE NEGATIVE Final   C Diff interpretation No C. difficile detected.  Final    Comment: Performed at [facility], [address], [place], [place] [address]          Radiology Studies: No results found.      Scheduled Meds:  apixaban  5 mg Oral BID   ferrous sulfate  325 mg Oral BID WC   mirtazapine  7.5 mg Oral QHS   pantoprazole  40 mg Oral BID   potassium chloride  40 mEq Oral TID   Continuous Infusions:  lactated ringers     magnesium sulfate bolus IVPB     potassium PHOSPHATE IVPB (in mmol)       LOS: 1 day        Briant Cedar, MD [facility]

## 2022-10-10 NOTE — Evaluation (Signed)
Physical Therapy Evaluation Patient Details Name: Frank Hernandez MRN: [id number] DOB: [birth year] Today's Date: 10/10/2022  History of Present Illness  82 year old with history of multiple myeloma, multiple bony metastatic lesion, normocytic anemia, CKD stage IIIa, DVT and PE on Eliquis presents from cancer center with severe ongoing hypokalemia.  Patient does have history of intractable diarrhea, recently admitted for hyperkalemia and AKI in the context of chronic diarrhea.  Clinical Impression  Pt admitted with above diagnosis.  Pt currently with functional limitations due to the deficits listed below (see PT Problem List). Pt will benefit from acute skilled PT to increase their independence and safety with mobility to allow discharge.  Pt is a poor historian and reporting coming in from home and not requiring assist for bathing and pericare.  Pt with recent admission and discharged 7/16 to SNF.  Uncertain if pt comes in from SNF or home, however if pt does not have assist available at home, will need to return to SNF.  Pt was able to briefly stand at bedside today with contact guard assist however declined transfer or further mobility due to requesting bath and hygiene care.  Despite rectal tube, pt had BM on bed pads and wet linen so removed dirty linen upon standing and replaced with clean linen while pt awaits nurse tech assist for bathing.           If plan is discharge home, recommend the following: A little help with walking and/or transfers;A little help with bathing/dressing/bathroom;Direct supervision/assist for medications management;Assistance with cooking/housework;Assist for transportation;Help with stairs or ramp for entrance   Can travel by private vehicle   No    Equipment Recommendations None recommended by PT  Recommendations for Other Services       Functional Status Assessment Patient has had a recent decline in their functional status and demonstrates the ability to  make significant improvements in function in a reasonable and predictable amount of time.     Precautions / Restrictions Precautions Precautions: Fall Precaution Comments: rectal tube Restrictions Weight Bearing Restrictions: No      Mobility  Bed Mobility Overal bed mobility: Needs Assistance Bed Mobility: Rolling, Sidelying to Sit Rolling: Supervision, Used rails Sidelying to sit: Supervision, Used rails       General bed mobility comments: cues for log roll technique due to rectabl tube, no physical assist required    Transfers Overall transfer level: Needs assistance Equipment used: None Transfers: Sit to/from Stand Sit to Stand: Contact guard assist           General transfer comment: pt declined using RW, held onto rail of bed; performed standing x2, not agreeable to transfer over to recliner today    Ambulation/Gait                  Stairs            Wheelchair Mobility     Tilt Bed    Modified Rankin (Stroke Patients Only)       Balance Overall balance assessment: Needs assistance Sitting-balance support: No upper extremity supported, Feet supported Sitting balance-Leahy Scale: Fair     Standing balance support: Single extremity supported, During functional activity Standing balance-Leahy Scale: Poor                               Pertinent Vitals/Pain Pain Assessment Pain Assessment: 0-10 Pain Score: 5  Pain Location: right foot Pain Descriptors / Indicators: Sore  Pain Intervention(s): Repositioned, Monitored during session    Home Living Family/patient expects to be discharged to:: Private residence Living Arrangements: Spouse/significant other Available Help at Discharge: Family Type of Home: House Home Access: Ramped entrance       Home Layout: One level Home Equipment: Agricultural consultant (2 wheels);BSC/3in1;Wheelchair - manual Additional Comments: Pt sleeps in his Lift recliner.    Prior Function Prior  Level of Function : Needs assist             Mobility Comments: reports having [facility] home services but unable to name specifics, reports getting into bathroom "when I can" , uses both RW and w/c ADLs Comments: pt reports he is able to bath and perform pericare but requires assist for dressing     Extremity/Trunk Assessment   Upper Extremity Assessment Upper Extremity Assessment: Generalized weakness    Lower Extremity Assessment Lower Extremity Assessment: Generalized weakness    Cervical / Trunk Assessment Cervical / Trunk Assessment: Normal  Communication   Communication Communication: No apparent difficulties  Cognition Arousal: Alert Behavior During Therapy: Flat affect Overall Cognitive Status: No family/caregiver present to determine baseline cognitive functioning                                 General Comments: pt poor historian        General Comments      Exercises     Assessment/Plan    PT Assessment Patient needs continued PT services  PT Problem List Decreased strength;Decreased activity tolerance;Decreased balance;Decreased mobility;Decreased safety awareness       PT Treatment Interventions DME instruction;Gait training;Balance training;Functional mobility training;Therapeutic activities;Therapeutic exercise;Manual techniques;Wheelchair mobility training;Patient/family education    PT Goals (Current goals can be found in the Care Plan section)  Acute Rehab PT Goals PT Goal Formulation: With patient Time For Goal Achievement: 10/24/22 Potential to Achieve Goals: Good    Frequency Min 1X/week     Co-evaluation               AM-PAC PT "6 Clicks" Mobility  Outcome Measure Help needed turning from your back to your side while in a flat bed without using bedrails?: A Little Help needed moving from lying on your back to sitting on the side of a flat bed without using bedrails?: A Little Help needed moving to and from a  bed to a chair (including a wheelchair)?: A Little Help needed standing up from a chair using your arms (e.g., wheelchair or bedside chair)?: A Little Help needed to walk in hospital room?: A Lot Help needed climbing 3-5 steps with a railing? : A Lot 6 Click Score: 16    End of Session Equipment Utilized During Treatment: Gait belt Activity Tolerance: Patient tolerated treatment well Patient left: in bed;with call bell/phone within reach;with bed alarm set Nurse Communication: Mobility status (pt refused to return to supine, remained at EOB waiting for bath, secretary aware and to notify NT, bed alarm active) PT Visit Diagnosis: Difficulty in walking, not elsewhere classified (R26.2);Muscle weakness (generalized) (M62.81)    Time: 4696-2952 PT Time Calculation (min) (ACUTE ONLY): 18 min   Charges:   PT Evaluation $PT Eval Low Complexity: 1 Low   PT General Charges $$ ACUTE PT VISIT: 1 Visit       Thomasene Mohair PT, DPT Physical Therapist Acute Rehabilitation Services Office: [phone]   Janan Halter [place] 10/10/2022, 12:41 PM

## 2022-10-11 DIAGNOSIS — E876 Hypokalemia: Secondary | ICD-10-CM | POA: Diagnosis not present

## 2022-10-11 MED ORDER — POTASSIUM CHLORIDE CRYS ER 20 MEQ PO TBCR
40.0000 meq | EXTENDED_RELEASE_TABLET | Freq: Two times a day (BID) | ORAL | Status: AC
  Administered 2022-10-11 – 2022-10-12 (×2): 40 meq via ORAL
  Filled 2022-10-11 (×2): qty 2

## 2022-10-11 MED ORDER — LACTATED RINGERS IV SOLN
INTRAVENOUS | Status: DC
  Administered 2022-10-11: 1000 mL via INTRAVENOUS

## 2022-10-11 NOTE — Plan of Care (Signed)

## 2022-10-11 NOTE — Progress Notes (Signed)
PROGRESS NOTE    Frank Hernandez  ZOX:[id number] DOB: 1940/12/21 DOA: 10/08/2022 PCP: [name] (Inactive)    Brief Narrative:  82 year old with history of multiple myeloma, multiple bony metastatic lesion, normocytic anemia, CKD stage IIIa, DVT and PE on Eliquis presents from cancer center with severe ongoing hypokalemia.  Patient does have history of intractable diarrhea, recently admitted for hyperkalemia and AKI in the context of chronic diarrhea. Patient was recently diagnosed with enteropathogenic E. coli and he completed azithromycin therapy X 3 weeks, but continues to have diarrhea.  Flexible sigmoidoscopy on 6/10 with no definitive pathology. In the ED, noted multiple electrolyte imbalance.  Patient admitted for further management.     Assessment & Plan:   Chronic intractable diarrhea Recent enteropathogenic E. coli s/p 3 weeks of azithromycin Suspicion for amyloidosis of loose bowels but the Congo red stain was negative Possibly immunotherapy related C. difficile, GI pathogen panel all negative Fecal calprotectin pending Consider GI consultation Continue rectal tube, imodium as needed Continue IV fluids Monitor closely  Multiple electrolyte imbalance Hypokalemia, hypomagnesemia, hypophosphatemia Continue aggressive repletion as needed   History of PE Eliquis continued, question absorption May need therapeutic Lovenox at this point in time, will defer to heme-onc  Normocytic anemia Iron deficiency anemia Baseline hemoglobin around 9 Continue oral iron supplementation  AKI on CKD stage IIIa Improving Continue IV fluids Daily BMP  History of multiple myeloma Metastatic bony lesion Follows with Dr. Candise Che, added to the treatment team On immunotherapy  Goals of care discussion Palliative consulted, patient full code Family would like to speak with oncology regarding treatment plan versus hospice   DVT prophylaxis:  apixaban (ELIQUIS) tablet 5 mg    Code Status: Full code Family Communication: None at bedside Disposition Plan: Status is: Inpatient  The patient will require care spanning > 2 midnights and should be moved to inpatient because: Severe electrolyte deficiencies on IV replacement     Consultants:  Palliative/hospice team  Procedures:  None  Antimicrobials:  None   Subjective: Still with poor oral intake and diarrhea. No new complaints      Objective: Vitals:   10/10/22 1350 10/10/22 2022 10/11/22 0429 10/11/22 1338  BP: (!) 151/69 (!) 153/80 (!) 141/65 (!) 148/77  Pulse: 60 70 64 (!) 58  Resp:  18 18 18   Temp: 98.1 F (36.7 C) 98.2 F (36.8 C) 98.6 F (37 C) 98.3 F (36.8 C)  TempSrc: Oral Oral Oral Oral  SpO2: 100% 98% 100% 100%  Weight:      Height:        Intake/Output Summary (Last 24 hours) at 10/11/2022 1649 Last data filed at 10/11/2022 1300 Gross per 24 hour  Intake 1331.5 ml  Output 2400 ml  Net -1068.5 ml   Filed Weights   10/08/22 1704 10/08/22 2249  Weight: 81.6 kg 80.7 kg    Examination:  General: NAD, elderly Cardiovascular: S1, S2 present Respiratory: CTAB Abdomen: Soft, nontender, nondistended, bowel sounds present Musculoskeletal: No bilateral pedal edema noted Skin: Chronic venous stasis changes B/l Psychiatry: Normal mood     Data Reviewed: I have personally reviewed following labs and imaging studies  CBC: Recent Labs  Lab 10/08/22 1349 10/08/22 1822 10/09/22 1106 10/10/22 0611 10/11/22 0659  WBC 6.0 6.8 6.7 8.9 6.2  NEUTROABS 4.1  --   --  6.5 3.7  HGB 8.9* 9.8* 9.2* 8.0* 8.3*  HCT 25.8* 29.2* 28.5* 23.5* 24.9*  MCV 85.7 88.8 93.4 88.0 88.9  PLT 375 375 320 321 315  Basic Metabolic Panel: Recent Labs  Lab 10/08/22 1349 10/08/22 1822 10/08/22 2000 10/09/22 1106 10/10/22 0611 10/11/22 0659  NA 139 138  --  141 142 140  K 2.3* 2.0*  --  2.5* 3.1* 3.4*  CL 108 106  --  111 112* 113*  CO2 18* 18*  --  19* 19* 19*  GLUCOSE 98 100*  --  118*  88 86  BUN 19 21  --  16 11 7*  CREATININE 1.46* 1.54*  --  1.22 0.99 0.98  CALCIUM 7.3* 7.6*  --  7.6* 7.5* 7.1*  MG  --   --  2.0 1.6* 1.5* 1.9  PHOS  --   --   --  <1.0* 1.9*  --    GFR: Estimated Creatinine Clearance: 59.1 mL/min (by C-G formula based on SCr of 0.98 mg/dL). Liver Function Tests: Recent Labs  Lab 10/08/22 1349 10/08/22 1822 10/09/22 1106 10/10/22 0611 10/11/22 0659  AST 24 27 22 22 17   ALT 16 21 17 14 14   ALKPHOS 134* 143* 123 107 107  BILITOT 0.6 0.7 0.6 0.6 0.6  PROT 6.1* 6.9 5.9* 5.4* 5.2*  ALBUMIN 3.7 3.8 3.2* 2.8* 2.8*   Recent Labs  Lab 10/08/22 1822  LIPASE 22   No results for input(s): "AMMONIA" in the last 168 hours. Coagulation Profile: No results for input(s): "INR", "PROTIME" in the last 168 hours. Cardiac Enzymes: No results for input(s): "CKTOTAL", "CKMB", "CKMBINDEX", "TROPONINI" in the last 168 hours. BNP (last 3 results) No results for input(s): "PROBNP" in the last 8760 hours. HbA1C: No results for input(s): "HGBA1C" in the last 72 hours. CBG: No results for input(s): "GLUCAP" in the last 168 hours. Lipid Profile: No results for input(s): "CHOL", "HDL", "LDLCALC", "TRIG", "CHOLHDL", "LDLDIRECT" in the last 72 hours. Thyroid Function Tests: No results for input(s): "TSH", "T4TOTAL", "FREET4", "T3FREE", "THYROIDAB" in the last 72 hours. Anemia Panel: No results for input(s): "VITAMINB12", "FOLATE", "FERRITIN", "TIBC", "IRON", "RETICCTPCT" in the last 72 hours. Sepsis Labs: No results for input(s): "PROCALCITON", "LATICACIDVEN" in the last 168 hours.  Recent Results (from the past 240 hour(s))  Gastrointestinal Panel by PCR , Stool     Status: None   Collection Time: 10/09/22  2:10 PM   Specimen: Stool  Result Value Ref Range Status   Campylobacter species NOT DETECTED NOT DETECTED Final   Plesimonas shigelloides NOT DETECTED NOT DETECTED Final   Salmonella species NOT DETECTED NOT DETECTED Final   Yersinia enterocolitica NOT  DETECTED NOT DETECTED Final   Vibrio species NOT DETECTED NOT DETECTED Final   Vibrio cholerae NOT DETECTED NOT DETECTED Final   Enteroaggregative E coli (EAEC) NOT DETECTED NOT DETECTED Final   Enteropathogenic E coli (EPEC) NOT DETECTED NOT DETECTED Final   Enterotoxigenic E coli (ETEC) NOT DETECTED NOT DETECTED Final   Shiga like toxin producing E coli (STEC) NOT DETECTED NOT DETECTED Final   Shigella/Enteroinvasive E coli (EIEC) NOT DETECTED NOT DETECTED Final   Cryptosporidium NOT DETECTED NOT DETECTED Final   Cyclospora cayetanensis NOT DETECTED NOT DETECTED Final   Entamoeba histolytica NOT DETECTED NOT DETECTED Final   Giardia lamblia NOT DETECTED NOT DETECTED Final   Adenovirus F40/41 NOT DETECTED NOT DETECTED Final   Astrovirus NOT DETECTED NOT DETECTED Final   Norovirus GI/GII NOT DETECTED NOT DETECTED Final   Rotavirus A NOT DETECTED NOT DETECTED Final   Sapovirus (I, II, IV, and V) NOT DETECTED NOT DETECTED Final    Comment: Performed at [facility], [address] [place], [place] [address]  C Difficile Quick Screen w PCR reflex     Status: None   Collection Time: 10/09/22  2:10 PM   Specimen: STOOL  Result Value Ref Range Status   C Diff antigen NEGATIVE NEGATIVE Final   C Diff toxin NEGATIVE NEGATIVE Final   C Diff interpretation No C. difficile detected.  Final    Comment: Performed at [facility], [address], [place], [place] [address]         Radiology Studies: No results found.      Scheduled Meds:  apixaban  5 mg Oral BID   ferrous sulfate  325 mg Oral BID WC   mirtazapine  7.5 mg Oral QHS   pantoprazole  40 mg Oral BID   Continuous Infusions:     LOS: 2 days        Briant Cedar, MD [facility]

## 2022-10-11 NOTE — Progress Notes (Signed)
OT Cancellation Note  Patient Details Name: Frank Hernandez MRN: [id number] DOB: [birth year]   Cancelled Treatment:    Reason Eval/Treat Not Completed: Other (comment) D/c skilled OT services at this time as patient is on comfort care. OT to sign off Rosalio Loud, MS Acute Rehabilitation Department Office# [phone]  10/11/2022, 10:26 AM

## 2022-10-12 DIAGNOSIS — E876 Hypokalemia: Secondary | ICD-10-CM | POA: Diagnosis not present

## 2022-10-12 LAB — BASIC METABOLIC PANEL WITH GFR
Anion gap: 10 (ref 5–15)
BUN: 5 mg/dL — ABNORMAL LOW (ref 8–23)
CO2: 17 mmol/L — ABNORMAL LOW (ref 22–32)
Calcium: 7.3 mg/dL — ABNORMAL LOW (ref 8.9–10.3)
Chloride: 114 mmol/L — ABNORMAL HIGH (ref 98–111)
Creatinine, Ser: 0.99 mg/dL (ref 0.61–1.24)
GFR, Estimated: >60 mL/min
Glucose, Bld: 81 mg/dL (ref 70–99)
Potassium: 3.4 mmol/L — ABNORMAL LOW (ref 3.5–5.1)
Sodium: 141 mmol/L (ref 135–145)

## 2022-10-12 LAB — PHOSPHORUS: Phosphorus: 1.8 mg/dL — ABNORMAL LOW (ref 2.5–4.6)

## 2022-10-12 MED ORDER — POTASSIUM PHOSPHATES 15 MMOLE/5ML IV SOLN
30.0000 mmol | Freq: Once | INTRAVENOUS | Status: AC
  Administered 2022-10-12: 30 mmol via INTRAVENOUS
  Filled 2022-10-12: qty 10

## 2022-10-12 MED ORDER — MAGNESIUM SULFATE 4 GM/100ML IV SOLN
4.0000 g | Freq: Once | INTRAVENOUS | Status: AC
  Administered 2022-10-12: 4 g via INTRAVENOUS
  Filled 2022-10-12: qty 100

## 2022-10-12 MED ORDER — FAMOTIDINE 20 MG PO TABS
20.0000 mg | ORAL_TABLET | Freq: Two times a day (BID) | ORAL | Status: DC
  Administered 2022-10-12 – 2022-10-17 (×10): 20 mg via ORAL
  Filled 2022-10-12 (×10): qty 1

## 2022-10-12 NOTE — Progress Notes (Signed)
PROGRESS NOTE    Frank Hernandez  ZOX:[id number] DOB: 07-12-1940 DOA: 10/08/2022 PCP: [name] (Inactive)    Brief Narrative:  82 year old with history of multiple myeloma, multiple bony metastatic lesion, normocytic anemia, CKD stage IIIa, DVT and PE on Eliquis presents from cancer center with severe ongoing hypokalemia.  Patient does have history of intractable diarrhea, recently admitted for hyperkalemia and AKI in the context of chronic diarrhea. Patient was recently diagnosed with enteropathogenic E. coli and he completed azithromycin therapy X 3 weeks, but continues to have diarrhea.  Flexible sigmoidoscopy on 6/10 with no definitive pathology. In the ED, noted multiple electrolyte imbalance.  Patient admitted for further management.     Assessment & Plan:   Chronic intractable diarrhea Possibly 2/2 ??PPI as per GI Recent enteropathogenic E. coli s/p 3 weeks of azithromycin Suspicion for amyloidosis but the Congo red stain was negative ??immunotherapy related C. difficile, GI pathogen panel all negative Fecal calprotectin pending Pancreatic elastase level to rule out pancreatic insufficiency, VIP pending GI consulted, recommend stopping pantoprazole, switching to famotidine, no plans for endoscopic evaluation at this time Continue rectal tube, imodium as needed S/p IV fluids Monitor closely  Multiple electrolyte imbalance Hypokalemia, hypomagnesemia, hypophosphatemia Continue aggressive repletion as needed   History of PE Eliquis continued, question absorption May need therapeutic Lovenox at this point in time, will defer to heme-onc  Normocytic anemia Iron deficiency anemia Baseline hemoglobin around 9 Continue oral iron supplementation  AKI on CKD stage IIIa Improved S/p IV fluids Daily BMP  History of multiple myeloma Metastatic bony lesion Follows with Dr. Candise Che, added to the treatment team On immunotherapy  Goals of care discussion Palliative  consulted and following  Patient full code   DVT prophylaxis:  apixaban (ELIQUIS) tablet 5 mg   Code Status: Full code Family Communication: None at bedside Disposition Plan: Status is: Inpatient  The patient will require care spanning > 2 midnights and should be moved to inpatient because: Severe electrolyte deficiencies on IV replacement     Consultants:  Palliative/hospice team GI  Procedures:  None  Antimicrobials:  None   Subjective: Still with persistent diarrhea, poor appetite     Objective: Vitals:   10/11/22 0429 10/11/22 1338 10/12/22 0607 10/12/22 1409  BP: (!) 141/65 (!) 148/77 118/67 (!) 141/66  Pulse: 64 (!) 58 (!) 58 66  Resp: 18 18 16  (!) 23  Temp: 98.6 F (37 C) 98.3 F (36.8 C) 98.7 F (37.1 C) 98.5 F (36.9 C)  TempSrc: Oral Oral Oral Oral  SpO2: 100% 100% 99% 100%  Weight:      Height:        Intake/Output Summary (Last 24 hours) at 10/12/2022 1721 Last data filed at 10/12/2022 1609 Gross per 24 hour  Intake 2503.56 ml  Output 1050 ml  Net 1453.56 ml   Filed Weights   10/08/22 1704 10/08/22 2249  Weight: 81.6 kg 80.7 kg    Examination:  General: NAD, elderly Cardiovascular: S1, S2 present Respiratory: CTAB Abdomen: Soft, nontender, nondistended, bowel sounds present Musculoskeletal: No bilateral pedal edema noted Skin: Chronic venous stasis changes B/l Psychiatry: Normal mood     Data Reviewed: I have personally reviewed following labs and imaging studies  CBC: Recent Labs  Lab 10/08/22 1349 10/08/22 1822 10/09/22 1106 10/10/22 0611 10/11/22 0659 10/12/22 0549  WBC 6.0 6.8 6.7 8.9 6.2 6.3  NEUTROABS 4.1  --   --  6.5 3.7 3.7  HGB 8.9* 9.8* 9.2* 8.0* 8.3* 8.2*  HCT 25.8*  29.2* 28.5* 23.5* 24.9* 25.0*  MCV 85.7 88.8 93.4 88.0 88.9 89.9  PLT 375 375 320 321 315 274   Basic Metabolic Panel: Recent Labs  Lab 10/08/22 1822 10/08/22 2000 10/09/22 1106 10/10/22 0611 10/11/22 0659 10/12/22 0549  NA 138  --  141  142 140 141  K 2.0*  --  2.5* 3.1* 3.4* 3.4*  CL 106  --  111 112* 113* 114*  CO2 18*  --  19* 19* 19* 17*  GLUCOSE 100*  --  118* 88 86 81  BUN 21  --  16 11 7* 5*  CREATININE 1.54*  --  1.22 0.99 0.98 0.99  CALCIUM 7.6*  --  7.6* 7.5* 7.1* 7.3*  MG  --  2.0 1.6* 1.5* 1.9 1.5*  PHOS  --   --  <1.0* 1.9*  --  1.8*   GFR: Estimated Creatinine Clearance: 58.5 mL/min (by C-G formula based on SCr of 0.99 mg/dL). Liver Function Tests: Recent Labs  Lab 10/08/22 1349 10/08/22 1822 10/09/22 1106 10/10/22 0611 10/11/22 0659  AST 24 27 22 22 17   ALT 16 21 17 14 14   ALKPHOS 134* 143* 123 107 107  BILITOT 0.6 0.7 0.6 0.6 0.6  PROT 6.1* 6.9 5.9* 5.4* 5.2*  ALBUMIN 3.7 3.8 3.2* 2.8* 2.8*   Recent Labs  Lab 10/08/22 1822  LIPASE 22   No results for input(s): "AMMONIA" in the last 168 hours. Coagulation Profile: No results for input(s): "INR", "PROTIME" in the last 168 hours. Cardiac Enzymes: No results for input(s): "CKTOTAL", "CKMB", "CKMBINDEX", "TROPONINI" in the last 168 hours. BNP (last 3 results) No results for input(s): "PROBNP" in the last 8760 hours. HbA1C: No results for input(s): "HGBA1C" in the last 72 hours. CBG: No results for input(s): "GLUCAP" in the last 168 hours. Lipid Profile: No results for input(s): "CHOL", "HDL", "LDLCALC", "TRIG", "CHOLHDL", "LDLDIRECT" in the last 72 hours. Thyroid Function Tests: No results for input(s): "TSH", "T4TOTAL", "FREET4", "T3FREE", "THYROIDAB" in the last 72 hours. Anemia Panel: No results for input(s): "VITAMINB12", "FOLATE", "FERRITIN", "TIBC", "IRON", "RETICCTPCT" in the last 72 hours. Sepsis Labs: No results for input(s): "PROCALCITON", "LATICACIDVEN" in the last 168 hours.  Recent Results (from the past 240 hour(s))  Gastrointestinal Panel by PCR , Stool     Status: None   Collection Time: 10/09/22  2:10 PM   Specimen: Stool  Result Value Ref Range Status   Campylobacter species NOT DETECTED NOT DETECTED Final    Plesimonas shigelloides NOT DETECTED NOT DETECTED Final   Salmonella species NOT DETECTED NOT DETECTED Final   Yersinia enterocolitica NOT DETECTED NOT DETECTED Final   Vibrio species NOT DETECTED NOT DETECTED Final   Vibrio cholerae NOT DETECTED NOT DETECTED Final   Enteroaggregative E coli (EAEC) NOT DETECTED NOT DETECTED Final   Enteropathogenic E coli (EPEC) NOT DETECTED NOT DETECTED Final   Enterotoxigenic E coli (ETEC) NOT DETECTED NOT DETECTED Final   Shiga like toxin producing E coli (STEC) NOT DETECTED NOT DETECTED Final   Shigella/Enteroinvasive E coli (EIEC) NOT DETECTED NOT DETECTED Final   Cryptosporidium NOT DETECTED NOT DETECTED Final   Cyclospora cayetanensis NOT DETECTED NOT DETECTED Final   Entamoeba histolytica NOT DETECTED NOT DETECTED Final   Giardia lamblia NOT DETECTED NOT DETECTED Final   Adenovirus F40/41 NOT DETECTED NOT DETECTED Final   Astrovirus NOT DETECTED NOT DETECTED Final   Norovirus GI/GII NOT DETECTED NOT DETECTED Final   Rotavirus A NOT DETECTED NOT DETECTED Final   Sapovirus (I,  II, IV, and V) NOT DETECTED NOT DETECTED Final    Comment: Performed at [facility], [address] [place], [place] [address]  C Difficile Quick Screen w PCR reflex     Status: None   Collection Time: 10/09/22  2:10 PM   Specimen: STOOL  Result Value Ref Range Status   C Diff antigen NEGATIVE NEGATIVE Final   C Diff toxin NEGATIVE NEGATIVE Final   C Diff interpretation No C. difficile detected.  Final    Comment: Performed at [facility], [address], [place], [place] [address]         Radiology Studies: No results found.      Scheduled Meds:  apixaban  5 mg Oral BID   famotidine  20 mg Oral BID   ferrous sulfate  325 mg Oral BID WC   mirtazapine  7.5 mg Oral QHS   Continuous Infusions:  lactated ringers 75 mL/hr at 10/12/22 1559   potassium PHOSPHATE IVPB (in mmol) 85 mL/hr at 10/12/22 1418      LOS: 3 days         Briant Cedar, MD [facility]

## 2022-10-12 NOTE — Consult Note (Cosign Needed Addendum)
Referring Provider: Dr. Andreas Newport Primary Care Physician:  [name] (Inactive) Primary Gastroenterologist:  Dr. Amada Jupiter   Reason for Consultation:  Chronic diarrhea   HPI: Frank Hernandez is a 82 y.o. 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   He was admitted to the hospital 07/29/2022 due to having a decreased appetite, abdominal distention and acute respiratory failure. CTA identified a left lower lobe segmental pulmonary embolism and doppler identified a LLE DVT therefore he was started on heparin IV.  He also diagnosed with suspected aspiration pneumonia. CTAP showed a dilated colon with air-fluid levels with a suspected transition in the area of the left inguinal hernia involving the sigmoid colon. He was seen by general surgery and supportive measures including NG tube for decompression was recommended as there was low concern for colonic obstruction. There was some suspicion that he had Ogilvie's/pseudoobstruction and neostigmine was considered if he did not show clinical improvement. A rectal tube was place with successful decompression. Patient also had diarrhea and C. difficile antigen and toxin levels were negative. GI pathogen panel was positive for enteropathic E. Coli and he was treated with  Azithromycin x 3 days.  He was also noted to have cellulitis of bilateral lower extremities for which he received IV antibiotics.  He was started on Cholestyramine 2 to 4 times daily.  He continued to have high-volume diarrhea and he subsequently underwent a flexible sigmoidoscopy 08/09/2022 which was unrevealing, no evidence of colitis or IBD. His clinical status stabilized and he was discharged to SNF on 08/16/2022.  His diarrhea was thought to possibly be due to Daratumumab  infusions for multiple myeloma, however, he endorsed having diarrhea prior to starting Daratumumab. His oncologist, Dr. Candise Che, did not think Daratumumab was the etiology for his diarrhea and at some point held this treatment and the patient's diarrhea did not improve.   He was readmitted to the hospital 09/08/2022 from [facility] health care center due to failure to thrive altered mental status.  Laboratory studies showed AKI on CKD with severe hyperkalemia and metabolic acidosis. K+ 7.5 treated with Lokelma, IV fluids, Lasix and D50. Head CT showed large partial empty sella which can be seen with idiopathic intracranial hypertension without acute intracranial abnormality. His AKI and electrolyte derangement were corrected and he was discharged home 09/14/2022.  He presented to the ED 10/08/2022 after laboratory studies done by his  PCP showed a potassium level of 2.3 and creatinine 1.46 up from 1.23.  He received IV fluids and KCL IV. He endorsed having diarrhea for the past week. He describes having dark brown to green watery to mud like diarrhea x 2 episodes during the night and x 1 episode during the day.  Flexi-Seal at this time has approximately 300 to 400 cc of dark brown watery stool.  No associated abdominal pain.  No nausea or vomiting.  No noticeable abdominal distention.  He is on Pantoprazole 40 mg twice daily which was started during his hospitalization 07/2022.  Denies having any GERD symptoms.  No history of ulcers.  He remains on Eliquis due to having a PE/DVT as noted above.  GI PROCEDURES:  Flexible sigmoidoscopy as an inpatient 08/09/2022: - Scattered areas of mildly edematous mucosa was noted in the proximal sigmoid colon, in the descending colon and in the transverse colon.  This was biopsied.  - Diverticulosis in the sigmoid colon.  - The rectum is normal. Path report showed colonic mucosa with hyperplastic change. Congo red stain was negative for amyloid.  Colonoscopy 07/23/2015: - One 2  mm hyperplastic polyp at the hepatic flexure, removed with a cold biopsy forceps. Resected and retrieved.  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   Pre-diabetes     Past Surgical History:  Procedure Laterality Date   BIOPSY  08/09/2022   Procedure: BIOPSY;  Surgeon: Shellia Cleverly, DO;  Location: [facility];  Service: Gastroenterology;;   COLONOSCOPY     FLEXIBLE SIGMOIDOSCOPY N/A 08/09/2022   Procedure: FLEXIBLE SIGMOIDOSCOPY;  Surgeon: Shellia Cleverly, DO;  Location: [facility];  Service: Gastroenterology;  Laterality: N/A;  propofol   INTRAMEDULLARY (IM) NAIL INTERTROCHANTERIC Left 06/11/2020   Procedure: INTRAMEDULLARY (IM) NAIL INTERTROCHANTRIC;  Surgeon: Roby Lofts, MD;  Location: [facility] OR;  Service: Orthopedics;  Laterality: Left;   INTRAMEDULLARY (IM) NAIL INTERTROCHANTERIC Right 04/17/2022   Procedure: INTRAMEDULLARY (IM) NAIL INTERTROCHANTERIC;  Surgeon: London Sheer, MD;  Location: [facility];  Service: Orthopedics;  Laterality: Right;   KNEE ARTHROSCOPY Bilateral    POLYPECTOMY      Prior to Admission medications   Medication Sig Start Date End Date Taking? Authorizing Provider  acetaminophen (TYLENOL) 500 MG tablet Take 1,000 mg by mouth every 8  (eight) hours as needed for moderate pain.   Yes [provider]  apixaban (ELIQUIS) 5 MG TABS tablet Take 1 tablet (5 mg total) by mouth 2 (two) times daily. 08/16/22  Yes Azucena Fallen, MD  ferrous sulfate 325 (65 FE) MG tablet Take 1 tablet (325 mg total) by mouth 2 (two) times daily with a meal. 06/01/22  Yes Rodolph Bong, MD  mirtazapine (REMERON) 7.5 MG tablet Take 7.5 mg by mouth at bedtime. 10/01/22  Yes [provider]  pantoprazole (PROTONIX) 40 MG tablet Take 1 tablet (40 mg total) by mouth 2 (two) times daily. 08/16/22  Yes Azucena Fallen, MD  Vitamin D, Ergocalciferol, (DRISDOL) 1.25 MG (50000 UNIT) CAPS capsule Take 1 capsule (50,000 Units total) by mouth once a week. Patient taking differently: Take 50,000 Units by mouth once a week. tuesday 07/09/22  Yes Johney Maine, MD    Current Facility-Administered Medications  Medication Dose Route Frequency Provider Last Rate Last Admin   acetaminophen (TYLENOL) tablet 1,000 mg  1,000 mg Oral Q8H PRN Tu, Ching T, DO       apixaban (ELIQUIS) tablet 5 mg  5 mg Oral BID Tu, Ching T, DO   5 mg at 10/12/22 0819   ferrous sulfate tablet 325 mg  325 mg Oral BID WC Tu, Ching T, DO   325 mg at 10/12/22 1610   lactated ringers infusion   Intravenous Continuous Briant Cedar, MD 75 mL/hr  at 10/12/22 1101 New Bag at 10/12/22 1101   loperamide (IMODIUM) capsule 2 mg  2 mg Oral PRN Briant Cedar, MD   2 mg at 10/11/22 0631   magnesium sulfate IVPB 4 g 100 mL  4 g Intravenous Once Briant Cedar, MD 50 mL/hr at 10/12/22 1053 4 g at 10/12/22 1053   mirtazapine (REMERON) tablet 7.5 mg  7.5 mg Oral QHS Tu, Ching T, DO   7.5 mg at 10/11/22 2204   pantoprazole (PROTONIX) EC tablet 40 mg  40 mg Oral BID Tu, Ching T, DO   40 mg at 10/12/22 3007   potassium PHOSPHATE 30 mmol in dextrose 5 % 500 mL infusion  30 mmol Intravenous Once Briant Cedar, MD       Facility-Administered Medications Ordered  in Other Encounters  Medication Dose Route Frequency Provider Last Rate Last Admin   potassium chloride SA (KLOR-CON M) CR tablet 40 mEq  40 mEq Oral Once Johney Maine, MD        Allergies as of 10/08/2022   (No Known Allergies)    Family History  Problem Relation Age of Onset   Colon cancer Neg Hx     Social History   Socioeconomic History   Marital status: Married    Spouse name: Not on file   Number of children: Not on file   Years of education: Not on file   Highest education level: Not on file  Occupational History   Not on file  Tobacco Use   Smoking status: Never   Smokeless tobacco: Never  Vaping Use   Vaping status: Never Used  Substance and Sexual Activity   Alcohol use: No    Alcohol/week: 0.0 standard drinks of alcohol   Drug use: No   Sexual activity: Not on file  Other Topics Concern   Not on file  Social History Narrative   Not on file   Social Determinants of Health   Financial Resource Strain: Not on file  Food Insecurity: No Food Insecurity (10/08/2022)   Hunger Vital Sign    Worried About Running Out of Food in the Last Year: Never true    Ran Out of Food in the Last Year: Never true  Transportation Needs: No Transportation Needs (10/08/2022)   PRAPARE - Administrator, Civil Service (Medical): No    Lack of Transportation (Non-Medical): No  Physical Activity: Not on file  Stress: Not on file  Social Connections: Not on file  Intimate Partner Violence: Not At Risk (10/08/2022)   Humiliation, Afraid, Rape, and Kick questionnaire    Fear of Current or Ex-Partner: No    Emotionally Abused: No    Physically Abused: No    Sexually Abused: No    Review of Systems: Gen: ? Weight loss.   CV: Denies chest pain, palpitations or edema. Resp: Denies cough, shortness of breath of hemoptysis.  GI: See HPI.   GU : Denies urinary burning, blood in urine, increased urinary frequency or incontinence. MS: Right leg and back pain. Derm:  Denies rash, itchiness, skin lesions or unhealing ulcers. Psych: Denies depression, anxiety, memory loss or confusion. Heme: Denies easy bruising, bleeding. Neuro:  Denies headaches, dizziness or paresthesias. Endo:  Denies any problems with DM, thyroid or adrenal function.  Physical Exam: Vital signs in last 24 hours: Temp:  [98.3 F (36.8 C)-98.7 F (37.1 C)] 98.7 F (37.1 C) (08/13 0607) Pulse Rate:  [58] 58 (08/13 0607) Resp:  [16-18] 16 (  08/13 1610) BP: (118-148)/(67-77) 118/67 (08/13 0607) SpO2:  [99 %-100 %] 99 % (08/13 0607) Last BM Date : 10/12/22 General: Alert male in no acute distress. Head:  Normocephalic and atraumatic. Eyes:  No scleral icterus. Conjunctiva pink. Ears:  Normal auditory acuity. Nose:  No deformity, discharge or lesions. Mouth:  Dentition intact. No ulcers or lesions.  Neck:  Supple. No lymphadenopathy or thyromegaly.  Lungs: Breath sounds clear throughout. No wheezes, rhonchi or crackles.  Heart: Regular rate and rhythm, no murmurs. Abdomen: Soft, nondistended.  Nontender.  Positive bowel sounds all 4 quadrants. Rectal: Deferred. Approximately 300 to 400 cc of dark brown liquid stool in Flexi-Seal bag. Musculoskeletal: Symmetrical without gross deformities.  Pulses:  Normal pulses noted. Extremities: No lower extremity edema. Neurologic:  Alert and  oriented x 4. No focal deficits.  Skin:  Intact without significant lesions or rashes. Psych:  Alert and cooperative. Normal mood and affect.  Intake/Output from previous day: 08/12 0701 - 08/13 0700 In: 1126.3 [P.O.:480; I.V.:646.3] Out: 1750 [Urine:700; Stool:1050] Intake/Output this shift: Total I/O In: 120 [P.O.:120] Out: -   Lab Results: Recent Labs    10/10/22 0611 10/11/22 0659 10/12/22 0549  WBC 8.9 6.2 6.3  HGB 8.0* 8.3* 8.2*  HCT 23.5* 24.9* 25.0*  PLT 321 315 274   BMET Recent Labs    10/10/22 0611 10/11/22 0659 10/12/22 0549  NA 142 140 141  K 3.1* 3.4* 3.4*  CL 112*  113* 114*  CO2 19* 19* 17*  GLUCOSE 88 86 81  BUN 11 7* 5*  CREATININE 0.99 0.98 0.99  CALCIUM 7.5* 7.1* 7.3*   LFT Recent Labs    10/11/22 0659  PROT 5.2*  ALBUMIN 2.8*  AST 17  ALT 14  ALKPHOS 107  BILITOT 0.6   PT/INR No results for input(s): "LABPROT", "INR" in the last 72 hours. Hepatitis Panel No results for input(s): "HEPBSAG", "HCVAB", "HEPAIGM", "HEPBIGM" in the last 72 hours.   Studies/Results: No results found.  IMPRESSION/PLAN:  82 year old male admitted to the hospital with hypokalemia and chronic diarrhea. No diarrhea. GI pathogen panel 10/09/2022 was negative. C. Diff antigen and toxin negative.  Flexible sigmoidoscopy 08/09/2022 was unrevealing, no evidence of colitis or IBD. PPI may be contributing to chronic diarrhea.  CTAP 07/29/2022 showed mild global pancreatic atrophy. -Stop Pantoprazole which may be contributing to diarrhea, hypokalemia and hypomagnesia -Famotidine 20 mg p.o. twice daily -Await fecal calprotectin level -Pancreatic elastase level to rule out pancreatic insufficiency  -Loperamide 2mg  one tab po bid PRN -No plans for endoscopic evaluation at this time -Seen by palliative care -Await further recommendations per Dr. Chales Abrahams  Multiple myeloma with AL amyloidosis, subacute pathologic intertrochanteric fracture of his right femur s/p IM nailing and multiple bony lytic lesions in the pelvis and spine. Last dose of Daratumumab was 06/2022. Followed by oncologist Dr. Candise Che  Chronic normocytic anemia. Hg 8.2. MCV 89.9. On Ferrous Sulfate po every day. No overt GI bleeding.  PE/LLE DVT on Eliquis   Hypokalemia, hypomagnesia and hypophosphatemia.  -Per the hospitalist  CKD. Cr 0.98.  History of colon polyps  Arnaldo Natal  10/12/2022, 2:44PM   Attending physician's note   I have taken history, reviewed the chart and examined the patient. I performed a substantive portion of this encounter, including complete performance of at least  one of the key components, in conjunction with the APP. I agree with the Advanced Practitioner's note, impression and recommendations.   Chronic diarrhea with profound electrolyte abnormalities, AKI on  Flexi-seal. ?etiology. Neg extensive WU in past including -Neg recent GI pathogens, neg C diff. Prev EPEC 07/2022, Rxed with azithromycin x 3 days -Neg Flex sig with Bx (neg [place] red stain for amyloidosis, neg for immune mediated diarrhea) -Neg CT AP -Diarrhea dates prior to starting Daratuminab  H/O Ogilvie's syndrome. Neg colon 2017 Large asymptomatic RIH Multiple myeloma with AL amyloidosis H/O PE/DVT on eliquis  Plan: -Hold PPIs -Check fecal elastase, calprotectin -Check x-ray KUB 2V -Since he had recent flex sig w/t Bx-no plans for repeat endoscopic evaluation -Check serum VIP, TSH, celiac serology -Trial of cholestyramine if x-ray KUB OK -Agree with loperamide 2mg  TID -If no etiology is evident, trial of budesonide and further workup (24hr urine for HIAA, stool collection)   Edman Circle, MD [facility] [phone]

## 2022-10-12 NOTE — Plan of Care (Signed)
°  Problem: Clinical Measurements: Goal: Will remain free from infection Outcome: Progressing Goal: Diagnostic test results will improve Outcome: Progressing   Problem: Activity: Goal: Risk for activity intolerance will decrease Outcome: Progressing   Problem: Elimination: Goal: Will not experience complications related to bowel motility Outcome: Progressing Goal: Will not experience complications related to urinary retention Outcome: Progressing   Problem: Safety: Goal: Ability to remain free from injury will improve Outcome: Progressing   Problem: Skin Integrity: Goal: Risk for impaired skin integrity will decrease Outcome: Progressing

## 2022-10-12 NOTE — Progress Notes (Signed)
Physical Therapy Treatment Patient Details Name: Frank Hernandez MRN: [id number] DOB: 1940/11/07 Today's Date: 10/12/2022   History of Present Illness 82 year old with history of multiple myeloma, multiple bony metastatic lesion, normocytic anemia, CKD stage IIIa, DVT and PE on Eliquis presents from cancer center with severe ongoing hypokalemia.  Patient does have history of intractable diarrhea, recently admitted for hyperkalemia and AKI in the context of chronic diarrhea.    PT Comments   Pt admitted with above diagnosis.  Pt currently with functional limitations due to the deficits listed below (see PT Problem List). Pt in bed when PT arrived. Pt agreeable to getting OOB and sitting in recliner. Pt indicated no pain. Pt required increased time CGA and cues for supine to sit, CGA for sit to stand  from EOB, able to progress with a few steps anteriorly with RW, CGA and cues and then complete SPT with RW to recliner. Pt left seated in recliner, all needs in place and nursing staff present. Pt will benefit from acute skilled PT to increase their independence and safety with mobility to allow discharge.      If plan is discharge home, recommend the following: A little help with walking and/or transfers;A little help with bathing/dressing/bathroom;Direct supervision/assist for medications management;Assistance with cooking/housework;Assist for transportation;Help with stairs or ramp for entrance   Can travel by private vehicle     No  Equipment Recommendations  None recommended by PT    Recommendations for Other Services       Precautions / Restrictions Precautions Precautions: Fall Precaution Comments: rectal tube Restrictions Weight Bearing Restrictions: No     Mobility  Bed Mobility Overal bed mobility: Needs Assistance Bed Mobility: Supine to Sit     Supine to sit: Contact guard, HOB elevated, Used rails     General bed mobility comments: min cues    Transfers Overall  transfer level: Needs assistance Equipment used: None Transfers: Sit to/from Stand, Bed to chair/wheelchair/BSC Sit to Stand: Contact guard assist Stand pivot transfers: Contact guard assist         General transfer comment: cues for proper UE placement pt able to push to stand from EOB to RW, maintain static standing balance with B UE support at RW and progress with a few steps with RW, CGA, cues and PT managing rectal tube, IV and purewick, prior to completing SPT to recliner    Ambulation/Gait Ambulation/Gait assistance: Contact guard assist Gait Distance (Feet): 2 Feet Assistive device: Rolling walker (2 wheels) Gait Pattern/deviations: Step-to pattern, Wide base of support, Trunk flexed Gait velocity: decreased     General Gait Details: B knee valgus deformity, trunk flexion and limited foot clearance and strice length, pt limited by fatigue. pt indicated he needed to get up and walk more   Stairs             Wheelchair Mobility     Tilt Bed    Modified Rankin (Stroke Patients Only)       Balance Overall balance assessment: Needs assistance Sitting-balance support: No upper extremity supported, Feet supported Sitting balance-Leahy Scale: Fair     Standing balance support: Single extremity supported, During functional activity Standing balance-Leahy Scale: Poor                              Cognition Arousal: Alert Behavior During Therapy: Flat affect Overall Cognitive Status: No family/caregiver present to determine baseline cognitive functioning  General Comments: pt poor historian        Exercises      General Comments        Pertinent Vitals/Pain Pain Assessment Pain Assessment: No/denies pain    Home Living Family/patient expects to be discharged to:: Private residence Living Arrangements: Spouse/significant other Available Help at Discharge: Family Type of Home: House Home  Access: Ramped entrance       Home Layout: One level Home Equipment: Agricultural consultant (2 wheels);BSC/3in1;Wheelchair - manual Additional Comments: Pt sleeps in his Lift recliner.    Prior Function            PT Goals (current goals can now be found in the care plan section) Acute Rehab PT Goals PT Goal Formulation: With patient Time For Goal Achievement: 10/24/22 Potential to Achieve Goals: Good Progress towards PT goals: Progressing toward goals    Frequency    Min 1X/week      PT Plan      Co-evaluation              AM-PAC PT "6 Clicks" Mobility   Outcome Measure  Help needed turning from your back to your side while in a flat bed without using bedrails?: A Little Help needed moving from lying on your back to sitting on the side of a flat bed without using bedrails?: A Little Help needed moving to and from a bed to a chair (including a wheelchair)?: A Little Help needed standing up from a chair using your arms (e.g., wheelchair or bedside chair)?: A Little Help needed to walk in hospital room?: A Lot Help needed climbing 3-5 steps with a railing? : A Lot 6 Click Score: 16    End of Session Equipment Utilized During Treatment: Gait belt Activity Tolerance: Patient tolerated treatment well Patient left: with call bell/phone within reach;in chair;with chair alarm set;with nursing/sitter in room Nurse Communication: Mobility status PT Visit Diagnosis: Difficulty in walking, not elsewhere classified (R26.2);Muscle weakness (generalized) (M62.81)     Time: 4098-1191 PT Time Calculation (min) (ACUTE ONLY): 16 min  Charges:    $Therapeutic Activity: 8-22 mins PT General Charges $$ ACUTE PT VISIT: 1 Visit                     Johnny Bridge, PT Acute Rehab    Jacqualyn Posey 10/12/2022, 12:14 PM

## 2022-10-13 ENCOUNTER — Encounter: Payer: Medicare PPO | Admitting: Orthopedic Surgery

## 2022-10-13 ENCOUNTER — Inpatient Hospital Stay (HOSPITAL_COMMUNITY): Payer: Medicare PPO

## 2022-10-13 DIAGNOSIS — E876 Hypokalemia: Secondary | ICD-10-CM | POA: Diagnosis not present

## 2022-10-13 MED ORDER — CHOLESTYRAMINE LIGHT 4 G PO PACK
4.0000 g | PACK | Freq: Every day | ORAL | Status: DC
  Administered 2022-10-13 – 2022-10-15 (×3): 4 g via ORAL
  Filled 2022-10-13 (×3): qty 1

## 2022-10-13 MED ORDER — OCTREOTIDE ACETATE 100 MCG/ML IJ SOLN
100.0000 ug | Freq: Three times a day (TID) | INTRAMUSCULAR | Status: DC
  Administered 2022-10-13 – 2022-10-20 (×20): 100 ug via SUBCUTANEOUS
  Filled 2022-10-13 (×24): qty 1

## 2022-10-13 MED ORDER — POTASSIUM PHOSPHATES 15 MMOLE/5ML IV SOLN
30.0000 mmol | Freq: Once | INTRAVENOUS | Status: AC
  Administered 2022-10-13: 30 mmol via INTRAVENOUS
  Filled 2022-10-13: qty 10

## 2022-10-13 NOTE — Progress Notes (Signed)
PROGRESS NOTE    Frank Hernandez  ZOX:[id number] DOB: 10-May-1940 DOA: 10/08/2022 PCP: [name] (Inactive)   Brief Narrative: 82 year old with history of multiple myeloma, multiple bony metastatic lesion, normocytic anemia, CKD stage IIIa, DVT and PE on Eliquis presents from cancer center with severe ongoing hypokalemia.  Patient does have history of intractable diarrhea, recently admitted for hyperkalemia and AKI in the context of chronic diarrhea. Patient was recently diagnosed with enteropathogenic E. coli and he completed azithromycin therapy X 3 weeks, but continues to have diarrhea.  Flexible sigmoidoscopy on 6/10 with no definitive pathology. In the ED, noted multiple electrolyte imbalance.  Patient admitted for further management.    Assessment & Plan:   Principal Problem:   Hypokalemia Active Problems:   Diarrhea   Essential hypertension   Normocytic anemia   Multiple myeloma not having achieved remission (HCC)   Acute renal failure superimposed on stage 3a chronic kidney disease (HCC)   History of pulmonary embolism   Palliative care encounter   Chronic intractable diarrhea Recent enteropathogenic E. coli s/p 3 weeks of azithromycin Suspicion for amyloidosis of loose bowels but the [place] red stain was negative Possibly immunotherapy related C. difficile, GI pathogen panel all negative Fecal calprotectin negative  Appreciate GI input.   GI thinks this is possibly related to PPI which has been stopped currently on Pepcid. Pancreatic fecal elastase level pending if low GI considering to start Creon Lomotil as needed KUB 10/13/2022-significant gaseous distention of the colon  Multiple electrolyte imbalance Hypokalemia, hypomagnesemia, hypophosphatemia Continue aggressive repletion as needed    History of PE-on Eliquis   Normocytic anemia Iron deficiency anemia Baseline hemoglobin around 9 Continue oral iron supplementation   AKI on CKD stage  IIIa Improving Continue IV fluids Daily BMP   History of multiple myeloma Metastatic bony lesion Follows with Dr. Candise Che, added to the treatment team On immunotherapy   Goals of care discussion Palliative consulted, patient full code Family would like to speak with oncology regarding treatment plan versus hospice    Pressure Injury 03/29/22 Buttocks Right Stage 1 -  Intact skin with non-blanchable redness of a localized area usually over a bony prominence. (Active)  03/29/22 2140  Location: Buttocks  Location Orientation: Right  Staging: Stage 1 -  Intact skin with non-blanchable redness of a localized area usually over a bony prominence.  Wound Description (Comments):   Present on Admission: Yes     Pressure Injury 05/28/22 Heel Right Unstageable - Full thickness tissue loss in which the base of the injury is covered by slough (yellow, tan, gray, green or brown) and/or eschar (tan, brown or black) in the wound bed. Open wound, broken from pressure & (Active)  05/28/22 1400  Location: Heel  Location Orientation: Right  Staging: Unstageable - Full thickness tissue loss in which the base of the injury is covered by slough (yellow, tan, gray, green or brown) and/or eschar (tan, brown or black) in the wound bed.  Wound Description (Comments): Open wound, broken from pressure & dry skin  Present on Admission: Yes     Pressure Injury 08/08/22 Buttocks Right;Left Deep Tissue Pressure Injury - Purple or maroon localized area of discolored intact skin or blood-filled blister due to damage of underlying soft tissue from pressure and/or shear. (Active)  08/08/22 1241  Location: Buttocks  Location Orientation: Right;Left  Staging: Deep Tissue Pressure Injury - Purple or maroon localized area of discolored intact skin or blood-filled blister due to damage of underlying soft tissue from pressure  and/or shear.  Wound Description (Comments):   Present on Admission:     Estimated body mass index  is 26.27 kg/m as calculated from the following:   Height as of this encounter: 5\' 9"  (1.753 m).   Weight as of this encounter: 80.7 kg.  DVT prophylaxis: Eliquis Code Status: Full Family Communication: None  disposition Plan:  Status is: Inpatient Remains inpatient appropriate because: Electrolite abnormalities and diarrhea   Consultants:  GI  Procedures: Rec tube  antimicrobials: None  Subjective: He is resting in bed He reports nausea vomiting  Objective: Flexi-Seal in place with dark brown stool Vitals:   10/12/22 1409 10/12/22 2111 10/13/22 0513 10/13/22 1325  BP: (!) 141/66 (!) 142/69 (!) 144/62 123/65  Pulse: 66 60 62 65  Resp: (!) 23 14 14 20   Temp: 98.5 F (36.9 C) 98.7 F (37.1 C) 98.5 F (36.9 C) 98 F (36.7 C)  TempSrc: Oral Oral Oral Oral  SpO2: 100% 100% 100% 100%  Weight:      Height:        Intake/Output Summary (Last 24 hours) at 10/13/2022 1334 Last data filed at 10/13/2022 1243 Gross per 24 hour  Intake 1617.31 ml  Output 2000 ml  Net -382.69 ml   Filed Weights   10/08/22 1704 10/08/22 2249  Weight: 81.6 kg 80.7 kg    Examination:  General exam: Appears chronically ill-appearing Respiratory system: Clear to auscultation. Respiratory effort normal. Cardiovascular system: S1 & S2 heard, RRR. No JVD, murmurs, rubs, gallops or clicks. No pedal edema. Gastrointestinal system: Abdomen is nondistended, soft and nontender. No organomegaly or masses felt. Normal bowel sounds heard. Central nervous system: Alert and oriented. No focal neurological deficits. Extremities: no edema  Data Reviewed: I have personally reviewed following labs and imaging studies  CBC: Recent Labs  Lab 10/08/22 1349 10/08/22 1822 10/09/22 1106 10/10/22 0611 10/11/22 0659 10/12/22 0549 10/13/22 0542  WBC 6.0   < > 6.7 8.9 6.2 6.3 6.5  NEUTROABS 4.1  --   --  6.5 3.7 3.7 3.9  HGB 8.9*   < > 9.2* 8.0* 8.3* 8.2* 8.5*  HCT 25.8*   < > 28.5* 23.5* 24.9* 25.0* 25.9*   MCV 85.7   < > 93.4 88.0 88.9 89.9 90.6  PLT 375   < > 320 321 315 274 282   < > = values in this interval not displayed.   Basic Metabolic Panel: Recent Labs  Lab 10/09/22 1106 10/10/22 0611 10/11/22 0659 10/12/22 0549 10/13/22 0542  NA 141 142 140 141 138  K 2.5* 3.1* 3.4* 3.4* 3.5  CL 111 112* 113* 114* 113*  CO2 19* 19* 19* 17* 17*  GLUCOSE 118* 88 86 81 79  BUN 16 11 7* 5* 5*  CREATININE 1.22 0.99 0.98 0.99 0.90  CALCIUM 7.6* 7.5* 7.1* 7.3* 7.3*  MG 1.6* 1.5* 1.9 1.5* 1.9  PHOS <1.0* 1.9*  --  1.8* 2.4*   GFR: Estimated Creatinine Clearance: 64.4 mL/min (by C-G formula based on SCr of 0.9 mg/dL). Liver Function Tests: Recent Labs  Lab 10/08/22 1349 10/08/22 1822 10/09/22 1106 10/10/22 0611 10/11/22 0659  AST 24 27 22 22 17   ALT 16 21 17 14 14   ALKPHOS 134* 143* 123 107 107  BILITOT 0.6 0.7 0.6 0.6 0.6  PROT 6.1* 6.9 5.9* 5.4* 5.2*  ALBUMIN 3.7 3.8 3.2* 2.8* 2.8*   Recent Labs  Lab 10/08/22 1822  LIPASE 22   No results for input(s): "AMMONIA" in the last 168  hours. Coagulation Profile: No results for input(s): "INR", "PROTIME" in the last 168 hours. Cardiac Enzymes: No results for input(s): "CKTOTAL", "CKMB", "CKMBINDEX", "TROPONINI" in the last 168 hours. BNP (last 3 results) No results for input(s): "PROBNP" in the last 8760 hours. HbA1C: No results for input(s): "HGBA1C" in the last 72 hours. CBG: No results for input(s): "GLUCAP" in the last 168 hours. Lipid Profile: No results for input(s): "CHOL", "HDL", "LDLCALC", "TRIG", "CHOLHDL", "LDLDIRECT" in the last 72 hours. Thyroid Function Tests: Recent Labs    10/13/22 0542  TSH 0.687   Anemia Panel: No results for input(s): "VITAMINB12", "FOLATE", "FERRITIN", "TIBC", "IRON", "RETICCTPCT" in the last 72 hours. Sepsis Labs: No results for input(s): "PROCALCITON", "LATICACIDVEN" in the last 168 hours.  Recent Results (from the past 240 hour(s))  Gastrointestinal Panel by PCR , Stool      Status: None   Collection Time: 10/09/22  2:10 PM   Specimen: Stool  Result Value Ref Range Status   Campylobacter species NOT DETECTED NOT DETECTED Final   Plesimonas shigelloides NOT DETECTED NOT DETECTED Final   Salmonella species NOT DETECTED NOT DETECTED Final   Yersinia enterocolitica NOT DETECTED NOT DETECTED Final   Vibrio species NOT DETECTED NOT DETECTED Final   Vibrio cholerae NOT DETECTED NOT DETECTED Final   Enteroaggregative E coli (EAEC) NOT DETECTED NOT DETECTED Final   Enteropathogenic E coli (EPEC) NOT DETECTED NOT DETECTED Final   Enterotoxigenic E coli (ETEC) NOT DETECTED NOT DETECTED Final   Shiga like toxin producing E coli (STEC) NOT DETECTED NOT DETECTED Final   Shigella/Enteroinvasive E coli (EIEC) NOT DETECTED NOT DETECTED Final   Cryptosporidium NOT DETECTED NOT DETECTED Final   Cyclospora cayetanensis NOT DETECTED NOT DETECTED Final   Entamoeba histolytica NOT DETECTED NOT DETECTED Final   Giardia lamblia NOT DETECTED NOT DETECTED Final   Adenovirus F40/41 NOT DETECTED NOT DETECTED Final   Astrovirus NOT DETECTED NOT DETECTED Final   Norovirus GI/GII NOT DETECTED NOT DETECTED Final   Rotavirus A NOT DETECTED NOT DETECTED Final   Sapovirus (I, II, IV, and V) NOT DETECTED NOT DETECTED Final    Comment: Performed at [facility], [address] [place], [place] [address]  C Difficile Quick Screen w PCR reflex     Status: None   Collection Time: 10/09/22  2:10 PM   Specimen: STOOL  Result Value Ref Range Status   C Diff antigen NEGATIVE NEGATIVE Final   C Diff toxin NEGATIVE NEGATIVE Final   C Diff interpretation No C. difficile detected.  Final    Comment: Performed at [facility], [address], [place], [place] [address]  Calprotectin, Fecal     Status: None   Collection Time: 10/10/22  5:15 PM   Specimen: Stool  Result Value Ref Range Status   Calprotectin, Fecal 12 0 - 120 ug/g Final    Comment: (NOTE) Concentration      Interpretation   Follow-Up < 5 - 50 ug/g     Normal           None >50 -120 ug/g     Borderline       Re-evaluate in 4-6 weeks    >120 ug/g     Abnormal         Repeat as clinically  indicated Performed At: [facility] [address] [place], [place] [id number] Jolene Schimke MD WU:[id number]      Radiology Studies: DG Abd 2 Views  Result Date: 10/13/2022 CLINICAL DATA:  Ogilvie syndrome common diarrhea EXAM: ABDOMEN - 2 VIEW COMPARISON:  KUB 08/06/2022, 08/04/2022, and priors FINDINGS: There is significant gaseous distention of the colon measuring up to approximately 8.4 cm in the transverse colon, similar to the study from 08/04/2022. There is no definite free intraperitoneal air. There is no abnormal soft tissue calcification. The imaged lungs clear. There is no acute osseous abnormality. Postsurgical changes are noted in both femurs. IMPRESSION: Significant gaseous distention of the colon is similar to the study from 08/04/2022. Electronically Signed   By: Lesia Hausen M.D.   On: 10/13/2022 11:33     Scheduled Meds:  apixaban  5 mg Oral BID   famotidine  20 mg Oral BID   ferrous sulfate  325 mg Oral BID WC   mirtazapine  7.5 mg Oral QHS   Continuous Infusions:   LOS: 4 days   Time spent: 39 min Alwyn Ren, MD  10/13/2022, 1:34 PM

## 2022-10-13 NOTE — Plan of Care (Signed)
  Problem: Education: Goal: Knowledge of General Education information will improve Description: Including pain rating scale, medication(s)/side effects and non-pharmacologic comfort measures Outcome: Progressing   Problem: Education: Goal: Knowledge of General Education information will improve Description: Including pain rating scale, medication(s)/side effects and non-pharmacologic comfort measures Outcome: Progressing   Problem: Clinical Measurements: Goal: Ability to maintain clinical measurements within normal limits will improve Outcome: Progressing Goal: Will remain free from infection Outcome: Progressing   Problem: Clinical Measurements: Goal: Ability to maintain clinical measurements within normal limits will improve Outcome: Progressing   Problem: Clinical Measurements: Goal: Will remain free from infection Outcome: Progressing   Problem: Activity: Goal: Risk for activity intolerance will decrease Outcome: Progressing   Problem: Activity: Goal: Risk for activity intolerance will decrease Outcome: Progressing   Problem: Nutrition: Goal: Adequate nutrition will be maintained Outcome: Progressing   Problem: Nutrition: Goal: Adequate nutrition will be maintained Outcome: Progressing   Problem: Elimination: Goal: Will not experience complications related to bowel motility Outcome: Progressing   Problem: Elimination: Goal: Will not experience complications related to bowel motility Outcome: Progressing   Problem: Safety: Goal: Ability to remain free from injury will improve Outcome: Progressing   Problem: Safety: Goal: Ability to remain free from injury will improve Outcome: Progressing   Problem: Skin Integrity: Goal: Risk for impaired skin integrity will decrease Outcome: Progressing   Problem: Skin Integrity: Goal: Risk for impaired skin integrity will decrease Outcome: Progressing

## 2022-10-13 NOTE — TOC Initial Note (Signed)
Transition of Care [facility]) - Initial/Assessment Note    Patient Details  Name: Frank Hernandez MRN: [id number] Date of Birth: [birth year]  [facility] CM/SW Contact:    Beckie Busing, RN Phone Number:[phone]  10/13/2022, 4:17 PM  Clinical Narrative:                 [facility] acknowledges consult for SNF . CM at bedside for assessment. Patient states that he is not going to rehab and gives CM permission to discuss with wife. CM spoke with [name] via  phone wife states that patient has been in and out of rehab for several months. Per wife patient is from home with [facility] through [facility] and is currently requesting palliative care consult. Per wife patient has DME at home (wheelchair, walker, shower chair BSC and recliner). Wife states that patient does have PCP and he follows up on a regular basis. Wife would like for patient to discharge home with [facility] services and palliative to follow. Per wife she is in agreement with not returning to rehab. TOC will continue to follow for disposition needs.     Barriers to Discharge: Continued Medical Work up   Patient Goals and CMS Choice Patient states their goals for this hospitalization and ongoing recovery are:: Wants to get better to go home. CMS Medicare.gov Compare Post Acute Care list provided to::  (refused) Choice offered to / list presented to :  (refused) [place] ownership interest in [facility].provided to::  (n/a)    Expected Discharge Plan and Services In-house Referral: NA Discharge Planning Services: CM Consult Post Acute Care Choice: NA Living arrangements for the past 2 months: Single Family Home                 DME Arranged: N/A DME Agency: NA       HH Arranged: NA          Prior Living Arrangements/Services Living arrangements for the past 2 months: Single Family Home Lives with:: Spouse Patient language and need for interpreter reviewed:: Yes Do you feel safe going back to the place  where you live?: Yes      Need for Family Participation in Patient Care: Yes (Comment) Care giver support system in place?: Yes (comment) Current home services: DME (cane walker wheelchairshower chair and recliner) Criminal Activity/Legal Involvement Pertinent to Current Situation/Hospitalization: No - Comment as needed  Activities of Daily Living Home Assistive Devices/Equipment: Cane (specify quad or straight), Walker (specify type) ADL Screening (condition at time of admission) Patient's cognitive ability adequate to safely complete daily activities?: Yes Is the patient deaf or have difficulty hearing?: No Does the patient have difficulty seeing, even when wearing glasses/contacts?: No Does the patient have difficulty concentrating, remembering, or making decisions?: No Patient able to express need for assistance with ADLs?: Yes Does the patient have difficulty dressing or bathing?: Yes Independently performs ADLs?: No Communication: Independent Dressing (OT): Needs assistance Is this a change from baseline?: Pre-admission baseline Grooming: Needs assistance Is this a change from baseline?: Pre-admission baseline Feeding: Independent Bathing: Needs assistance Is this a change from baseline?: Pre-admission baseline Toileting: Needs assistance Is this a change from baseline?: Pre-admission baseline In/Out Bed: Needs assistance Is this a change from baseline?: Pre-admission baseline Walks in Home: Needs assistance Is this a change from baseline?: Pre-admission baseline Does the patient have difficulty walking or climbing stairs?: Yes Weakness of Legs: Both Weakness of Arms/Hands: None  Permission Sought/Granted Permission sought to share information  with : Case Manager, Family Supports Permission granted to share information with : Yes, Verbal Permission Granted  Share Information with NAME: Monterio Tellechea     Permission granted to share info w Relationship: spouse  Permission  granted to share info w Contact Information: [phone]  Emotional Assessment Appearance:: Appears stated age Attitude/Demeanor/Rapport: Gracious Affect (typically observed): Pleasant, Quiet Orientation: : Oriented to Self Alcohol / Substance Use: Not Applicable Psych Involvement: No (comment)  Admission diagnosis:  Hypokalemia [E87.6] Diarrhea, unspecified type [R19.7] Patient Active Problem List   Diagnosis Date Noted   Palliative care encounter 10/10/2022   History of pulmonary embolism 10/08/2022   Protein-calorie malnutrition, severe 09/11/2022   Hyperkalemia 09/08/2022   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/09/2022   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   Hypomagnesemia 07/29/2022   Bowel obstruction (HCC) 07/29/2022   Prolonged QT interval 07/29/2022   Cellulitis of both lower extremities 07/29/2022   Acute pulmonary embolism (HCC) 07/29/2022   Glaucoma 07/29/2022   Bradyarrhythmia 06/01/2022   Sinus pause 05/29/2022   Multiple myeloma (HCC) 05/29/2022   Acute renal failure superimposed on stage 3a chronic kidney disease (HCC) 05/28/2022   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   Hypotension 05/28/2022   Dehydration 05/28/2022   Diarrhea 05/28/2022   Lactic acidosis 05/28/2022   Counseling regarding advance care planning and goals of care 04/22/2022   Multiple myeloma not having achieved remission (HCC) 04/21/2022   Vitamin D deficiency 04/19/2022   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) 04/15/2022   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   Closed displaced subtrochanteric fracture of right femur (HCC) 04/13/2022   Normocytic anemia 04/13/2022   Chronic kidney disease, stage 3a (HCC) 04/03/2022   Decreased oral intake 04/02/2022   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   Essential hypertension 04/01/2022   Pain in joint, lower leg 04/01/2022   Effusion of right knee 03/30/2022   Pressure injury of  skin 03/30/2022   Effusion, right knee 03/30/2022   Concussion with unknown loss of consciousness status 12/24/2020   Hypokalemia    Rib fractures 06/10/2020   Severe essential hypertension 11/15/2018   Syncope 12/26/2015   Pre-diabetes 12/26/2015   PCP:  [name] (Inactive) Pharmacy:   [facility] [place] - [address] [place] ([place]) [place] [address] Phone: [phone] Fax: [phone]     Social Determinants of Health (SDOH) Social History: SDOH Screenings   Food Insecurity: No Food Insecurity (10/08/2022)  Housing: Low Risk  (10/08/2022)  Transportation Needs: No Transportation Needs (10/08/2022)  Utilities: Not At Risk (10/08/2022)  Depression (PHQ2-9): Low Risk  (04/29/2022)  Tobacco Use: Low Risk  (10/08/2022)   SDOH Interventions:     Readmission Risk Interventions    10/13/2022    4:11 PM 09/09/2022   12:00 PM 08/16/2022   10:15 AM  Readmission Risk Prevention Plan  Transportation Screening Complete Complete Complete  Medication Review Oceanographer) Complete Complete Complete  PCP or Specialist appointment within 3-5 days of discharge Complete Complete Complete  HRI or Home Care Consult Complete Complete Complete  SW Recovery Care/Counseling Consult Complete Complete Complete  Palliative Care Screening Complete Not Applicable Not Applicable  Skilled Nursing Facility Patient Refused Complete Complete

## 2022-10-13 NOTE — Progress Notes (Addendum)
[facility] Progress Note  CC:   Chronic diarrhea   Subjective: He denies having any nausea or vomiting.  No abdominal pain or abdominal distention.  No chest pain or shortness of breath.  No family at the bedside.   Objective:  Vital signs in last 24 hours: Temp:  [98.5 F (36.9 C)-98.7 F (37.1 C)] 98.5 F (36.9 C) (08/14 0513) Pulse Rate:  [60-66] 62 (08/14 0513) Resp:  [14-23] 14 (08/14 0513) BP: (141-144)/(62-69) 144/62 (08/14 0513) SpO2:  [100 %] 100 % (08/14 0513) Last BM Date : 10/13/22 General: 82 year old male in no acute distress. Heart: Regular rate and rhythm, no murmurs. Pulm: Breath sounds clear throughout. Abdomen: Soft, nondistended.  Nontender.  Positive bowel sounds to all 4 quadrants.  Flexi-Seal bag with less than 100 cc dark brown watery stool. Extremities: No lower extremity edema. Neurologic:  Alert and  oriented x 4.  Speech is clear.  Moves all extremities equally. Psych:  Alert and cooperative. Normal mood and affect.  Intake/Output from previous day: 08/13 0701 - 08/14 0700 In: 1617.3 [P.O.:360; I.V.:965.9; IV Piggyback:291.4] Out: 2000 [Urine:850; Stool:1150] Intake/Output this shift: No intake/output data recorded.  Lab Results: Recent Labs    10/11/22 0659 10/12/22 0549 10/13/22 0542  WBC 6.2 6.3 6.5  HGB 8.3* 8.2* 8.5*  HCT 24.9* 25.0* 25.9*  PLT 315 274 282   BMET Recent Labs    10/11/22 0659 10/12/22 0549 10/13/22 0542  NA 140 141 138  K 3.4* 3.4* 3.5  CL 113* 114* 113*  CO2 19* 17* 17*  GLUCOSE 86 81 79  BUN 7* 5* 5*  CREATININE 0.98 0.99 0.90  CALCIUM 7.1* 7.3* 7.3*   LFT Recent Labs    10/11/22 0659  PROT 5.2*  ALBUMIN 2.8*  AST 17  ALT 14  ALKPHOS 107  BILITOT 0.6   PT/INR No results for input(s): "LABPROT", "INR" in the last 72 hours. Hepatitis Panel No results for input(s): "HEPBSAG", "HCVAB", "HEPAIGM", "HEPBIGM" in the last 72 hours.  No results found.  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 No abdominal pain. GI pathogen panel 10/09/2022 was negative. C. Diff antigen and toxin negative.  Flexible sigmoidoscopy 08/09/2022 was unrevealing, no evidence of colitis or IBD. PPI may be contributing to chronic diarrhea. CTAP 07/29/2022 showed mild global pancreatic atrophy. Sed rate 5. CRP pending. TSH 0.687. VIP pending. Normal fecal calprotectin level. Abdominal xray pending.  Past 24 hour stool output 1,150 ml. Low suspicion for Daratumumab as etiology for diarrhea per oncology.  -Stop Pantoprazole which may be contributing to diarrhea, hypokalemia and hypomagnesia -Famotidine 20 mg p.o. twice daily -Await pancreatic fecal elastase level, if low, will require Creon or Zenep  -Loperamide 2mg  one tab po bid PRN -Await am abdominal xray result prior to initiating Cholestyramine  -No plans for endoscopic evaluation at this time -Check tTG and IgA in am -24hr urine 5HIAA -Consider Octreotide SQ if high volume diarrhea persists  -Heart healthy diet -Palliative care following  -Await further recommendations per Dr. Chales Abrahams   Multiple myeloma with AL amyloidosis, subacute pathologic intertrochanteric fracture of his right femur s/p IM nailing and multiple bony lytic lesions in the pelvis and spine. Last dose of Daratumumab was 06/2022. Followed by oncologist Dr. Candise Che   Chronic normocytic anemia. Hg 8.2 -> 8.5. MCV 89.9. On Ferrous Sulfate po every day. No overt GI bleeding.   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  -Management per the hospitalist   CKD. Cr 0.90.   History of colon polyps  Large right inguinal hernia per CTAP 06/2022    Principal Problem:   Hypokalemia Active Problems:   Essential hypertension   Normocytic anemia   Multiple myeloma not having achieved remission (HCC)   Acute renal failure  superimposed on stage 3a chronic kidney disease (HCC)   Diarrhea   History of pulmonary embolism   Palliative care encounter     LOS: 4 days   Arnaldo Natal  10/13/2022, 9:52AM   Attending physician's note   I have taken history, reviewed the chart and examined the patient. I performed a substantive portion of this encounter, including complete performance of at least one of the key components, in conjunction with the APP. I agree with the Advanced Practitioner's note, impression and recommendations.   Continued large volume diarrhea -X-ray KUB-stable baseline colonic dil -Nl ESR, CRP, TSH, neg fecal calprotectin -Neg recent GI pathogens, neg C diff. Prev EPEC 07/2022, Rxed with azithromycin x 3 days -Neg Flex sig with Bx (neg congo red stain for amyloidosis, neg for immune mediated diarrhea) -Neg CT AP -Diarrhea dates prior to starting Daratuminab  MM with AL amyloidosis  Plan: -Follow S. VIP, calcitonin, gastrin level, celiac serology, urine 5HIAA -Follow fecal elastase -Stool for crypto/microsporidia/Blastocystis -Trial of octreotide SQ -Restart cholestyramine 4 g every day -Continue loperamide -Watch for worsening Ogilvie's.   Edman Circle, MD [facility] [phone]

## 2022-10-13 NOTE — Progress Notes (Signed)
Mobility Specialist - Progress Note   10/13/22 1514  Mobility  Activity Moved into chair position in bed  Activity Response Tolerated well  Mobility Referral Yes  $Mobility charge 1 Mobility  Mobility Specialist Start Time (ACUTE ONLY) 0305  Mobility Specialist Stop Time (ACUTE ONLY) 0312  Mobility Specialist Time Calculation (min) (ACUTE ONLY) 7 min   Pt received in bed declining mobility but agreeable to move bed into chair position. Verbalized the importance of sitting up for a while. No complaints during session. Pt to bed after session with all needs met.  [facility]

## 2022-10-14 DIAGNOSIS — N179 Acute kidney failure, unspecified: Secondary | ICD-10-CM | POA: Diagnosis not present

## 2022-10-14 DIAGNOSIS — Z515 Encounter for palliative care: Secondary | ICD-10-CM | POA: Diagnosis not present

## 2022-10-14 DIAGNOSIS — R197 Diarrhea, unspecified: Secondary | ICD-10-CM | POA: Diagnosis not present

## 2022-10-14 DIAGNOSIS — R4589 Other symptoms and signs involving emotional state: Secondary | ICD-10-CM

## 2022-10-14 DIAGNOSIS — E876 Hypokalemia: Secondary | ICD-10-CM | POA: Diagnosis not present

## 2022-10-14 DIAGNOSIS — Z86711 Personal history of pulmonary embolism: Secondary | ICD-10-CM | POA: Diagnosis not present

## 2022-10-14 LAB — CBC WITH DIFFERENTIAL/PLATELET
Abs Immature Granulocytes: 0.02 10*3/uL (ref 0.00–0.07)
Basophils Absolute: 0 10*3/uL (ref 0.0–0.1)
Basophils Relative: 0 %
Eosinophils Absolute: 0.6 10*3/uL — ABNORMAL HIGH (ref 0.0–0.5)
Eosinophils Relative: 9 %
HCT: 24.8 % — ABNORMAL LOW (ref 39.0–52.0)
Hemoglobin: 8.1 g/dL — ABNORMAL LOW (ref 13.0–17.0)
Immature Granulocytes: 0 %
Lymphocytes Relative: 18 %
Lymphs Abs: 1.1 10*3/uL (ref 0.7–4.0)
MCH: 29 pg (ref 26.0–34.0)
MCHC: 32.7 g/dL (ref 30.0–36.0)
MCV: 88.9 fL (ref 80.0–100.0)
Monocytes Absolute: 0.8 10*3/uL (ref 0.1–1.0)
Monocytes Relative: 13 %
Neutro Abs: 3.6 10*3/uL (ref 1.7–7.7)
Neutrophils Relative %: 60 %
Platelets: 260 10*3/uL (ref 150–400)
RBC: 2.79 MIL/uL — ABNORMAL LOW (ref 4.22–5.81)
RDW: 16.3 % — ABNORMAL HIGH (ref 11.5–15.5)
WBC: 6.2 10*3/uL (ref 4.0–10.5)
nRBC: 0 % (ref 0.0–0.2)

## 2022-10-14 LAB — BASIC METABOLIC PANEL
Anion gap: 6 (ref 5–15)
BUN: <5 mg/dL — ABNORMAL LOW (ref 8–23)
CO2: 17 mmol/L — ABNORMAL LOW (ref 22–32)
Calcium: 6.9 mg/dL — ABNORMAL LOW (ref 8.9–10.3)
Chloride: 112 mmol/L — ABNORMAL HIGH (ref 98–111)
Creatinine, Ser: 0.92 mg/dL (ref 0.61–1.24)
GFR, Estimated: >60 mL/min (ref >60)
Glucose, Bld: 120 mg/dL — ABNORMAL HIGH (ref 70–99)
Potassium: 3.5 mmol/L (ref 3.5–5.1)
Sodium: 135 mmol/L (ref 135–145)

## 2022-10-14 LAB — PHOSPHORUS: Phosphorus: 2.9 mg/dL (ref 2.5–4.6)

## 2022-10-14 LAB — MAGNESIUM: Magnesium: 1.8 mg/dL (ref 1.7–2.4)

## 2022-10-14 MED ORDER — LOPERAMIDE HCL 2 MG PO CAPS
2.0000 mg | ORAL_CAPSULE | Freq: Two times a day (BID) | ORAL | Status: DC
  Administered 2022-10-14 – 2022-10-17 (×6): 2 mg via ORAL
  Filled 2022-10-14 (×6): qty 1

## 2022-10-14 MED ORDER — PANCRELIPASE (LIP-PROT-AMYL) 12000-38000 UNITS PO CPEP
36000.0000 [IU] | ORAL_CAPSULE | Freq: Three times a day (TID) | ORAL | Status: DC
  Administered 2022-10-14 – 2022-10-22 (×22): 36000 [IU] via ORAL
  Filled 2022-10-14 (×22): qty 3

## 2022-10-14 NOTE — Progress Notes (Addendum)
Daily Progress Note  DOA: 10/08/2022 Hospital Day: 7  Chief Complaint:  diarrhea  ASSESSMENT & PLAN   Brief Narrative:  Frank Hernandez is a 82 y.o. year old male with a history of  DVT / PE on Eliquis, multiple myeloma, chronic anemia, CKD 3a,  Admitted with hypokalemia, chronic diarrhea  Chronic, intermittent diarrhea / electrolyte abnormalities. No documented weight loss -Neg recent GI pathogen, neg C diff. Prev EPEC 07/2022, Rxed with azithromycin x 3 days  -Negative fecal calprotectin -Neg Flex sig with Bx June 2024 (neg congo red stain for amyloidosis, neg for immune mediated diarrhea) -Normal TSH -VIP is pending -Celiac serologies is pending.  -Pancreatic elastase is pending -5 HIAA not yet collected -CT AP on 5/30 >> dilated colon with air fluid levels.  -Diarrhea preceded initiation of Daratuminab -Awaiting above study results. No plan for repeat endoscopic evaluation -Pantoprazole on hold, may be contributing to diarrhea,  -Yesterday's KUB >>significant gaseous distention of colon  -Has prn Loperamide ordered but hasn't be getting it. Will change to scheduled dosing starting with 2 mg BID.  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 Hgb stable.   Subjective   No complaints  Objective    Recent Labs    10/12/22 0549 10/13/22 0542 10/14/22 0549  WBC 6.3 6.5 6.2  HGB 8.2* 8.5* 8.1*  HCT 25.0* 25.9* 24.8*  PLT 274 282 260   BMET Recent Labs    10/12/22 0549 10/13/22 0542 10/14/22 0549  NA 141 138 135  K 3.4* 3.5 3.5  CL 114* 113* 112*  CO2 17* 17* 17*  GLUCOSE 81 79 120*  BUN 5* 5* <5*  CREATININE 0.99 0.90 0.92  CALCIUM 7.3* 7.3* 6.9*   LFT No results for input(s): "PROT", "ALBUMIN", "AST", "ALT", "ALKPHOS", "BILITOT", "BILIDIR", "IBILI" in the last 72 hours. PT/INR No results for input(s): "LABPROT",  "INR" in the last 72 hours.   Imaging:  DG Abd 2 Views CLINICAL DATA:  Ogilvie syndrome common diarrhea  EXAM: ABDOMEN - 2 VIEW  COMPARISON:  KUB 08/06/2022, 08/04/2022, and priors  FINDINGS: There is significant gaseous distention of the colon measuring up to approximately 8.4 cm in the transverse colon, similar to the study from 08/04/2022. There is no definite free intraperitoneal air. There is no abnormal soft tissue calcification.  The imaged lungs clear. There is no acute osseous abnormality. Postsurgical changes are noted in both femurs.  IMPRESSION: Significant gaseous distention of the colon is similar to the study from 08/04/2022.  Electronically Signed   By: Lesia Hausen M.D.   On: 10/13/2022 11:33     Scheduled inpatient medications:   apixaban  5 mg Oral BID   cholestyramine light  4 g Oral Daily   famotidine  20 mg Oral BID   ferrous sulfate  325 mg Oral BID WC   mirtazapine  7.5 mg Oral QHS   octreotide  100 mcg Subcutaneous TID   Continuous inpatient infusions:  PRN inpatient medications: acetaminophen, loperamide  Vital signs in last 24 hours: Temp:  [98 F (36.7 C)-98.7 F (37.1 C)] 98.4 F (36.9 C) (08/15 0428) Pulse Rate:  [58-67] 67 (08/15 0428) Resp:  [18-20] 18 (08/15 0428) BP: (123-151)/(62-81) 144/81 (08/15 0428) SpO2:  [100 %] 100 % (08/15 0428) Last BM Date : 10/08/22 (has rectal tube)  Intake/Output Summary (Last 24 hours) at 10/14/2022 1035  Last data filed at 10/14/2022 0940 Gross per 24 hour  Intake 410.6 ml  Output 1050 ml  Net -639.4 ml    Intake/Output from previous day: 08/14 0701 - 08/15 0700 In: 410.6 [P.O.:360; IV Piggyback:50.6] Out: 1050 [Urine:1050] Intake/Output this shift: Total I/O In: 120 [P.O.:120] Out: -    Physical Exam:  General: Alert male in NAD Heart:  Regular rate.  Pulmonary: Normal respiratory effort. Bilateral lower lobe wheezing.  Abdomen: Soft, nondistended, nontender. Normal bowel  sounds. Watery black stool in flexiseal bag ( on iron) Neurologic: Alert and oriented Psych: Pleasant. Cooperative. Insight appears normal.    Principal Problem:   Hypokalemia Active Problems:   Essential hypertension   Normocytic anemia   Multiple myeloma not having achieved remission (HCC)   Acute renal failure superimposed on stage 3a chronic kidney disease (HCC)   Diarrhea   History of pulmonary embolism   Palliative care encounter     LOS: 5 days   Willette Cluster ,NP 10/14/2022, 10:35 AM   Attending physician's note   I have reviewed the chart and did not examine the patient as he was asleep- I did not disturb. I performed a substantive portion of this encounter, including complete performance of at least one of the key components, in conjunction with the APP. I agree with the Advanced Practitioner's note, impression and recommendations.   Low fecal elastase (not sure if it is false +) -nevertheless, worth treating with Creon Await response to octreotide. If continued diarrhea, will increase cholestyramine BID Extensive workup in progress.   Edman Circle, MD [facility] [phone]

## 2022-10-14 NOTE — Plan of Care (Signed)
°  Problem: Clinical Measurements: Goal: Will remain free from infection Outcome: Progressing   Problem: Activity: Goal: Risk for activity intolerance will decrease Outcome: Progressing   Problem: Nutrition: Goal: Adequate nutrition will be maintained Outcome: Progressing

## 2022-10-14 NOTE — Progress Notes (Signed)
Daily Progress Note   Patient Name: Frank Hernandez       Date: 10/14/2022 DOB: 17-Nov-1940  Age: 82 y.o. MRN#: [id number] Attending Physician: Alwyn Ren, MD Primary Care Physician: [name] (Inactive) Admit Date: 10/08/2022 Length of Stay: 5 days  Reason for Consultation/Follow-up: Establishing goals of care  Subjective:   CC: Patient notes his diarrhea is improving and he has no other concerns at this time.  Following up regarding complex medical decision making.  Subjective:  Reviewed EMR prior to presenting to bedside.  GI following to assist with diarrhea management. TOC has already talked with patient's wife and plan is for patient to go home with home health support as well as home palliative medicine referral.  Patient and family wanting to continue conversations with oncologist regarding cancer related therapies moving forward.  Presented to bedside to meet with patient.  No family present at bedside during time of visit.  Introduced myself as a member of the palliative medicine team.  Inquired about patient's symptom burden at this time.  Patient notes his diarrhea is improving and he is feeling much better today.  Still hoping to go home with home health and palliative medicine follow-up in the outpatient setting.  Vent time providing emotional support via active listening.  All questions answered at that time.  Noted palliative medicine team continue to follow along with patient's medical journey as needed.  Review of Systems Notes diarrhea improved, patient feeling overall "better" Objective:   Vital Signs:  BP (!) 144/81 (BP Location: Left Arm)   Pulse 67   Temp 98.4 F (36.9 C) (Oral)   Resp 18   Ht 5\' 9"  (1.753 m)   Wt 80.7 kg   SpO2 100%   BMI 26.27 kg/m   Physical Exam: General: NAD, alert, pleasant, laying in bed Eyes: No drainage noted HENT: moist mucous membranes Cardiovascular: RRR Respiratory: no increased work of breathing noted,  not in respiratory distress Neuro: A&Ox4, following commands easily Psych: appropriately answers all questions  Imaging:  I personally reviewed recent imaging.   Assessment & Plan:   Assessment: Patient is an 82 year old male with a past medical history of multiple myeloma previously on therapies with [facility] with last therapy on 07/09/2022, pathologic right hip fracture, AL amyloidosis, remote history of bladder cancer, hypertension, CKD, DVT/PE, and history of aspiration pneumonia who was admitted on 10/08/2022 after presenting to the oncology office and was found to have severe hypokalemia.  Patient has had multiple hospitalizations since the beginning of this year.  GI consulted to assist with management.  Recommendations/Plan: # Complex medical decision making/goals of care:  - Patient continuing with current level of therapies.  Family has already voiced that they would want to speak to oncology prior to any decisions to transition over to hospice focused care.  In light of this patient will be returning home with home health and palliative medicine home follow-up.  -  Code Status: Full Code  # Symptom management:  -As per GI, hospitalist  # Psychosocial Support:  -Wife, son  # Discharge Planning: Home with Home Health and home palliative medicine follow up  -TOC following to assist with discharge planning.   Thank you for allowing the palliative care team to participate in the care [facility].  Alvester Morin, DO Palliative Care Provider PMT # [phone]  If patient remains symptomatic despite maximum doses, please call PMT at [phone] between 0700 and 1900. Outside of these hours, please call attending, as  PMT does not have night coverage.  *Please note that this is a verbal dictation therefore any spelling or grammatical errors are due to the "[employer]" system interpretation.

## 2022-10-14 NOTE — Progress Notes (Signed)
PROGRESS NOTE    Frank Hernandez  GEX:[id number] DOB: 1940/12/28 DOA: 10/08/2022 PCP: [name] (Inactive)   Brief Narrative: 82 year old with history of multiple myeloma, multiple bony metastatic lesion, normocytic anemia, CKD stage IIIa, DVT and PE on Eliquis presents from cancer center with severe ongoing hypokalemia.  Patient does have history of intractable diarrhea, recently admitted for hyperkalemia and AKI in the context of chronic diarrhea. Patient was recently diagnosed with enteropathogenic E. coli and he completed azithromycin therapy X 3 weeks, but continues to have diarrhea.  Flexible sigmoidoscopy on 6/10 with no definitive pathology. In the ED, noted multiple electrolyte imbalance.  Patient admitted for further management.    Assessment & Plan:   Principal Problem:   Hypokalemia Active Problems:   Diarrhea   Essential hypertension   Normocytic anemia   Multiple myeloma not having achieved remission (HCC)   Acute renal failure superimposed on stage 3a chronic kidney disease (HCC)   History of pulmonary embolism   Palliative care encounter   Need for emotional support   Chronic intractable diarrhea Recent enteropathogenic E. coli s/p 3 weeks of azithromycin Suspicion for amyloidosis of loose bowels but the [place] red stain was negative Possibly immunotherapy related C. difficile, GI pathogen panel all negative Fecal calprotectin negative  Appreciate GI input.   GI thinks this is possibly related to PPI which has been stopped currently on Pepcid. Pancreatic fecal elastase level pending if low GI considering to start Creon Lomotil as needed KUB 10/13/2022-significant gaseous distention of the colon Cholestyramine started on 10/14/2022 some decreased urine output noted VIP/celiac/5-HIAA/pancreatic elastase  pending  Multiple electrolyte imbalance Hypokalemia, hypomagnesemia, hypophosphatemia Continue aggressive repletion as needed    History of PE-on  Eliquis   Normocytic anemia Iron deficiency anemia Baseline hemoglobin around 9 Continue oral iron supplementation   AKI on CKD stage IIIa Improving Continue IV fluids Daily BMP   History of multiple myeloma Metastatic bony lesion Follows with Dr. Candise Che, added to the treatment team On immunotherapy   Goals of care discussion Palliative consulted, patient full code Family would like to speak with oncology regarding treatment plan versus hospice    Pressure Injury 03/29/22 Buttocks Right Stage 1 -  Intact skin with non-blanchable redness of a localized area usually over a bony prominence. (Active)  03/29/22 2140  Location: Buttocks  Location Orientation: Right  Staging: Stage 1 -  Intact skin with non-blanchable redness of a localized area usually over a bony prominence.  Wound Description (Comments):   Present on Admission: Yes     Pressure Injury 05/28/22 Heel Right Unstageable - Full thickness tissue loss in which the base of the injury is covered by slough (yellow, tan, gray, green or brown) and/or eschar (tan, brown or black) in the wound bed. Open wound, broken from pressure & (Active)  05/28/22 1400  Location: Heel  Location Orientation: Right  Staging: Unstageable - Full thickness tissue loss in which the base of the injury is covered by slough (yellow, tan, gray, green or brown) and/or eschar (tan, brown or black) in the wound bed.  Wound Description (Comments): Open wound, broken from pressure & dry skin  Present on Admission: Yes     Pressure Injury 08/08/22 Buttocks Right;Left Deep Tissue Pressure Injury - Purple or maroon localized area of discolored intact skin or blood-filled blister due to damage of underlying soft tissue from pressure and/or shear. (Active)  08/08/22 1241  Location: Buttocks  Location Orientation: Right;Left  Staging: Deep Tissue Pressure Injury - Purple or  maroon localized area of discolored intact skin or blood-filled blister due to damage of  underlying soft tissue from pressure and/or shear.  Wound Description (Comments):   Present on Admission:     Estimated body mass index is 26.27 kg/m as calculated from the following:   Height as of this encounter: 5\' 9"  (1.753 m).   Weight as of this encounter: 80.7 kg.  DVT prophylaxis: Eliquis Code Status: Full Family Communication: None  disposition Plan:  Status is: Inpatient Remains inpatient appropriate because: Electrolite abnormalities and diarrhea   Consultants:  GI  Procedures: Rec tube  antimicrobials: None  Subjective: He is resting in bed He reports nausea vomiting  Objective: Flexi-Seal in place with dark brown stool Vitals:   10/13/22 0513 10/13/22 1325 10/13/22 2123 10/14/22 0428  BP: (!) 144/62 123/65 (!) 151/62 (!) 144/81  Pulse: 62 65 (!) 58 67  Resp: 14 20 18 18   Temp: 98.5 F (36.9 C) 98 F (36.7 C) 98.7 F (37.1 C) 98.4 F (36.9 C)  TempSrc: Oral Oral Oral Oral  SpO2: 100% 100% 100% 100%  Weight:      Height:        Intake/Output Summary (Last 24 hours) at 10/14/2022 1339 Last data filed at 10/14/2022 0940 Gross per 24 hour  Intake 170.6 ml  Output 1050 ml  Net -879.4 ml   Filed Weights   10/08/22 1704 10/08/22 2249  Weight: 81.6 kg 80.7 kg    Examination:  General exam: Appears chronically ill-appearing Respiratory system: Clear to auscultation. Respiratory effort normal. Cardiovascular system: S1 & S2 heard, RRR. No JVD, murmurs, rubs, gallops or clicks. No pedal edema. Gastrointestinal system: Abdomen is nondistended, soft and nontender. No organomegaly or masses felt. Normal bowel sounds heard. Central nervous system: Alert and oriented. No focal neurological deficits. Extremities: no edema  Data Reviewed: I have personally reviewed following labs and imaging studies  CBC: Recent Labs  Lab 10/10/22 0611 10/11/22 0659 10/12/22 0549 10/13/22 0542 10/14/22 0549  WBC 8.9 6.2 6.3 6.5 6.2  NEUTROABS 6.5 3.7 3.7 3.9 3.6  HGB  8.0* 8.3* 8.2* 8.5* 8.1*  HCT 23.5* 24.9* 25.0* 25.9* 24.8*  MCV 88.0 88.9 89.9 90.6 88.9  PLT 321 315 274 282 260   Basic Metabolic Panel: Recent Labs  Lab 10/09/22 1106 10/10/22 0611 10/11/22 0659 10/12/22 0549 10/13/22 0542 10/14/22 0549  NA 141 142 140 141 138 135  K 2.5* 3.1* 3.4* 3.4* 3.5 3.5  CL 111 112* 113* 114* 113* 112*  CO2 19* 19* 19* 17* 17* 17*  GLUCOSE 118* 88 86 81 79 120*  BUN 16 11 7* 5* 5* <5*  CREATININE 1.22 0.99 0.98 0.99 0.90 0.92  CALCIUM 7.6* 7.5* 7.1* 7.3* 7.3* 6.9*  MG 1.6* 1.5* 1.9 1.5* 1.9 1.8  PHOS <1.0* 1.9*  --  1.8* 2.4* 2.9   GFR: Estimated Creatinine Clearance: 63 mL/min (by C-G formula based on SCr of 0.92 mg/dL). Liver Function Tests: Recent Labs  Lab 10/08/22 1349 10/08/22 1822 10/09/22 1106 10/10/22 0611 10/11/22 0659  AST 24 27 22 22 17   ALT 16 21 17 14 14   ALKPHOS 134* 143* 123 107 107  BILITOT 0.6 0.7 0.6 0.6 0.6  PROT 6.1* 6.9 5.9* 5.4* 5.2*  ALBUMIN 3.7 3.8 3.2* 2.8* 2.8*   Recent Labs  Lab 10/08/22 1822  LIPASE 22   No results for input(s): "AMMONIA" in the last 168 hours. Coagulation Profile: No results for input(s): "INR", "PROTIME" in the last 168 hours. Cardiac  Enzymes: No results for input(s): "CKTOTAL", "CKMB", "CKMBINDEX", "TROPONINI" in the last 168 hours. BNP (last 3 results) No results for input(s): "PROBNP" in the last 8760 hours. HbA1C: No results for input(s): "HGBA1C" in the last 72 hours. CBG: No results for input(s): "GLUCAP" in the last 168 hours. Lipid Profile: No results for input(s): "CHOL", "HDL", "LDLCALC", "TRIG", "CHOLHDL", "LDLDIRECT" in the last 72 hours. Thyroid Function Tests: Recent Labs    10/13/22 0542  TSH 0.687   Anemia Panel: No results for input(s): "VITAMINB12", "FOLATE", "FERRITIN", "TIBC", "IRON", "RETICCTPCT" in the last 72 hours. Sepsis Labs: No results for input(s): "PROCALCITON", "LATICACIDVEN" in the last 168 hours.  Recent Results (from the past 240 hour(s))   Gastrointestinal Panel by PCR , Stool     Status: None   Collection Time: 10/09/22  2:10 PM   Specimen: Stool  Result Value Ref Range Status   Campylobacter species NOT DETECTED NOT DETECTED Final   Plesimonas shigelloides NOT DETECTED NOT DETECTED Final   Salmonella species NOT DETECTED NOT DETECTED Final   Yersinia enterocolitica NOT DETECTED NOT DETECTED Final   Vibrio species NOT DETECTED NOT DETECTED Final   Vibrio cholerae NOT DETECTED NOT DETECTED Final   Enteroaggregative E coli (EAEC) NOT DETECTED NOT DETECTED Final   Enteropathogenic E coli (EPEC) NOT DETECTED NOT DETECTED Final   Enterotoxigenic E coli (ETEC) NOT DETECTED NOT DETECTED Final   Shiga like toxin producing E coli (STEC) NOT DETECTED NOT DETECTED Final   Shigella/Enteroinvasive E coli (EIEC) NOT DETECTED NOT DETECTED Final   Cryptosporidium NOT DETECTED NOT DETECTED Final   Cyclospora cayetanensis NOT DETECTED NOT DETECTED Final   Entamoeba histolytica NOT DETECTED NOT DETECTED Final   Giardia lamblia NOT DETECTED NOT DETECTED Final   Adenovirus F40/41 NOT DETECTED NOT DETECTED Final   Astrovirus NOT DETECTED NOT DETECTED Final   Norovirus GI/GII NOT DETECTED NOT DETECTED Final   Rotavirus A NOT DETECTED NOT DETECTED Final   Sapovirus (I, II, IV, and V) NOT DETECTED NOT DETECTED Final    Comment: Performed at [facility], [address] [place], [place] [address]  C Difficile Quick Screen w PCR reflex     Status: None   Collection Time: 10/09/22  2:10 PM   Specimen: STOOL  Result Value Ref Range Status   C Diff antigen NEGATIVE NEGATIVE Final   C Diff toxin NEGATIVE NEGATIVE Final   C Diff interpretation No C. difficile detected.  Final    Comment: Performed at [facility], [address], [place], [place] [address]  Calprotectin, Fecal     Status: None   Collection Time: 10/10/22  5:15 PM   Specimen: Stool  Result Value Ref Range Status   Calprotectin, Fecal 12 0 - 120  ug/g Final    Comment: (NOTE) Concentration     Interpretation   Follow-Up < 5 - 50 ug/g     Normal           None >50 -120 ug/g     Borderline       Re-evaluate in 4-6 weeks    >120 ug/g     Abnormal         Repeat as clinically                                   indicated Performed At: [facility] [address] [place], [place] [id number] Jolene Schimke MD  NW:[phone]      Radiology Studies: DG Abd 2 Views  Result Date: 10/13/2022 CLINICAL DATA:  Ogilvie syndrome common diarrhea EXAM: ABDOMEN - 2 VIEW COMPARISON:  KUB 08/06/2022, 08/04/2022, and priors FINDINGS: There is significant gaseous distention of the colon measuring up to approximately 8.4 cm in the transverse colon, similar to the study from 08/04/2022. There is no definite free intraperitoneal air. There is no abnormal soft tissue calcification. The imaged lungs clear. There is no acute osseous abnormality. Postsurgical changes are noted in both femurs. IMPRESSION: Significant gaseous distention of the colon is similar to the study from 08/04/2022. Electronically Signed   By: Lesia Hausen M.D.   On: 10/13/2022 11:33     Scheduled Meds:  apixaban  5 mg Oral BID   cholestyramine light  4 g Oral Daily   famotidine  20 mg Oral BID   ferrous sulfate  325 mg Oral BID WC   mirtazapine  7.5 mg Oral QHS   octreotide  100 mcg Subcutaneous TID   Continuous Infusions:   LOS: 5 days   Time spent: 39 min Alwyn Ren, MD  10/14/2022, 1:39 PM

## 2022-10-14 NOTE — Progress Notes (Signed)
Physical Therapy Treatment Patient Details Name: Frank Hernandez MRN: [id number] DOB: 1941/02/13 Today's Date: 10/14/2022   History of Present Illness 82 year old with history of multiple myeloma, multiple bony metastatic lesion, normocytic anemia, CKD stage IIIa, DVT and PE on Eliquis presents from cancer center with severe ongoing hypokalemia.  Patient does have history of intractable diarrhea, recently admitted for hyperkalemia and AKI in the context of chronic diarrhea.    PT Comments  Pt able to perform bed mobility and transfers to recliner using RW with only supervision.  Pt reports plan is for d/c home which he is greatly looking forward to.  Pt performed LE exercises once sitting in recliner.       If plan is discharge home, recommend the following: A little help with walking and/or transfers;A little help with bathing/dressing/bathroom;Direct supervision/assist for medications management;Assistance with cooking/housework;Assist for transportation;Help with stairs or ramp for entrance   Can travel by private vehicle        Equipment Recommendations  None recommended by PT    Recommendations for Other Services       Precautions / Restrictions Precautions Precautions: Fall Precaution Comments: rectal tube     Mobility  Bed Mobility Overal bed mobility: Needs Assistance Bed Mobility: Supine to Sit Rolling: Supervision, Used rails Sidelying to sit: Supervision, Used rails       General bed mobility comments: cues for watching rectal tube line    Transfers Overall transfer level: Needs assistance Equipment used: Rolling walker (2 wheels) Transfers: Sit to/from Stand, Bed to chair/wheelchair/BSC Sit to Stand: Supervision   Step pivot transfers: Supervision       General transfer comment: pt stated he was able to perform without assist, close supervision for safety, therapist managed lines    Ambulation/Gait                   Stairs              Wheelchair Mobility     Tilt Bed    Modified Rankin (Stroke Patients Only)       Balance                                            Cognition Arousal: Alert Behavior During Therapy: Flat affect Overall Cognitive Status: No family/caregiver present to determine baseline cognitive functioning                                 General Comments: pt poor historian        Exercises General Exercises - Lower Extremity Ankle Circles/Pumps: AROM, Both, 15 reps Long Arc Quad: AROM, Both, Seated, 20 reps Hip ABduction/ADduction: AROM, Both, Seated, 15 reps Hip Flexion/Marching: AROM, Both, 20 reps, Seated    General Comments        Pertinent Vitals/Pain Pain Assessment Pain Assessment: No/denies pain    Home Living                          Prior Function            PT Goals (current goals can now be found in the care plan section) Progress towards PT goals: Progressing toward goals    Frequency    Min 1X/week      PT Plan  Co-evaluation              AM-PAC PT "6 Clicks" Mobility   Outcome Measure  Help needed turning from your back to your side while in a flat bed without using bedrails?: A Little Help needed moving from lying on your back to sitting on the side of a flat bed without using bedrails?: A Little Help needed moving to and from a bed to a chair (including a wheelchair)?: A Little Help needed standing up from a chair using your arms (e.g., wheelchair or bedside chair)?: A Little Help needed to walk in hospital room?: A Lot Help needed climbing 3-5 steps with a railing? : A Lot 6 Click Score: 16    End of Session Equipment Utilized During Treatment: Gait belt Activity Tolerance: Patient tolerated treatment well Patient left: in chair;with call bell/phone within reach;with chair alarm set Nurse Communication: Mobility status PT Visit Diagnosis: Difficulty in walking, not elsewhere  classified (R26.2);Muscle weakness (generalized) (M62.81)     Time: 8469-6295 PT Time Calculation (min) (ACUTE ONLY): 18 min  Charges:    $Therapeutic Activity: 8-22 mins PT General Charges $$ ACUTE PT VISIT: 1 Visit                     Paulino Door, DPT Physical Therapist Acute Rehabilitation Services Office: [phone]    Janan Halter Payson 10/14/2022, 2:24 PM

## 2022-10-14 NOTE — Plan of Care (Signed)

## 2022-10-15 ENCOUNTER — Encounter: Payer: Self-pay | Admitting: Hematology

## 2022-10-15 DIAGNOSIS — E876 Hypokalemia: Secondary | ICD-10-CM | POA: Diagnosis not present

## 2022-10-15 LAB — BASIC METABOLIC PANEL
Anion gap: 8 (ref 5–15)
BUN: 5 mg/dL — ABNORMAL LOW (ref 8–23)
CO2: 17 mmol/L — ABNORMAL LOW (ref 22–32)
Calcium: 7.3 mg/dL — ABNORMAL LOW (ref 8.9–10.3)
Chloride: 111 mmol/L (ref 98–111)
Creatinine, Ser: 0.92 mg/dL (ref 0.61–1.24)
GFR, Estimated: >60 mL/min (ref >60)
Glucose, Bld: 112 mg/dL — ABNORMAL HIGH (ref 70–99)
Potassium: 3 mmol/L — ABNORMAL LOW (ref 3.5–5.1)
Sodium: 136 mmol/L (ref 135–145)

## 2022-10-15 LAB — CBC WITH DIFFERENTIAL/PLATELET
Abs Immature Granulocytes: 0.02 10*3/uL (ref 0.00–0.07)
Basophils Absolute: 0 10*3/uL (ref 0.0–0.1)
Basophils Relative: 0 %
Eosinophils Absolute: 0.4 10*3/uL (ref 0.0–0.5)
Eosinophils Relative: 6 %
HCT: 25.1 % — ABNORMAL LOW (ref 39.0–52.0)
Hemoglobin: 8 g/dL — ABNORMAL LOW (ref 13.0–17.0)
Immature Granulocytes: 0 %
Lymphocytes Relative: 14 %
Lymphs Abs: 1 10*3/uL (ref 0.7–4.0)
MCH: 29.3 pg (ref 26.0–34.0)
MCHC: 31.9 g/dL (ref 30.0–36.0)
MCV: 91.9 fL (ref 80.0–100.0)
Monocytes Absolute: 0.9 10*3/uL (ref 0.1–1.0)
Monocytes Relative: 13 %
Neutro Abs: 4.5 10*3/uL (ref 1.7–7.7)
Neutrophils Relative %: 67 %
Platelets: 249 10*3/uL (ref 150–400)
RBC: 2.73 MIL/uL — ABNORMAL LOW (ref 4.22–5.81)
RDW: 16.4 % — ABNORMAL HIGH (ref 11.5–15.5)
WBC: 6.8 10*3/uL (ref 4.0–10.5)
nRBC: 0 % (ref 0.0–0.2)

## 2022-10-15 LAB — MISC LABCORP TEST (SEND OUT): Labcorp test code: 182204

## 2022-10-15 LAB — PHOSPHORUS: Phosphorus: 2.3 mg/dL — ABNORMAL LOW (ref 2.5–4.6)

## 2022-10-15 LAB — MAGNESIUM: Magnesium: 1.6 mg/dL — ABNORMAL LOW (ref 1.7–2.4)

## 2022-10-15 MED ORDER — POTASSIUM CHLORIDE CRYS ER 20 MEQ PO TBCR
40.0000 meq | EXTENDED_RELEASE_TABLET | Freq: Once | ORAL | Status: AC
  Administered 2022-10-15: 40 meq via ORAL
  Filled 2022-10-15: qty 2

## 2022-10-15 MED ORDER — MAGNESIUM SULFATE 2 GM/50ML IV SOLN
2.0000 g | Freq: Once | INTRAVENOUS | Status: AC
  Administered 2022-10-15: 2 g via INTRAVENOUS
  Filled 2022-10-15: qty 50

## 2022-10-15 MED ORDER — POTASSIUM CHLORIDE 10 MEQ/100ML IV SOLN
10.0000 meq | INTRAVENOUS | Status: AC
  Administered 2022-10-15 – 2022-10-16 (×6): 10 meq via INTRAVENOUS
  Filled 2022-10-15 (×3): qty 100

## 2022-10-15 MED ORDER — CHOLESTYRAMINE LIGHT 4 G PO PACK
4.0000 g | PACK | Freq: Three times a day (TID) | ORAL | Status: DC
  Administered 2022-10-15 – 2022-10-22 (×19): 4 g via ORAL
  Filled 2022-10-15 (×22): qty 1

## 2022-10-15 NOTE — Plan of Care (Signed)

## 2022-10-15 NOTE — Progress Notes (Signed)
PROGRESS NOTE    Frank Hernandez  XLK:[id number] DOB: 1940/12/03 DOA: 10/08/2022 PCP: [name] (Inactive)   Brief Narrative: 82 year old with history of multiple myeloma, multiple bony metastatic lesion, normocytic anemia, CKD stage IIIa, DVT and PE on Eliquis presents from cancer center with severe ongoing hypokalemia.  Patient does have history of intractable diarrhea, recently admitted for hyperkalemia and AKI in the context of chronic diarrhea. Patient was recently diagnosed with enteropathogenic E. coli and he completed azithromycin therapy X 3 weeks, but continues to have diarrhea.  Flexible sigmoidoscopy on 6/10 with no definitive pathology. In the ED, noted multiple electrolyte imbalance.  Patient admitted for further management.    Assessment & Plan:   Principal Problem:   Hypokalemia Active Problems:   Diarrhea   Essential hypertension   Normocytic anemia   Multiple myeloma not having achieved remission (HCC)   Acute renal failure superimposed on stage 3a chronic kidney disease (HCC)   History of pulmonary embolism   Palliative care encounter   Need for emotional support   Chronic intractable diarrhea Recent enteropathogenic E. coli s/p 3 weeks of azithromycin Suspicion for amyloidosis of loose bowels but the [place] red stain was negative Possibly immunotherapy related C. difficile, GI pathogen panel all negative Fecal calprotectin negative  Appreciate GI input.   GI thinks this is possibly related to PPI which has been stopped currently on Pepcid. Pancreatic fecal elastase level pending if low GI considering to start Creon Lomotil as needed KUB 10/13/2022-significant gaseous distention of the colon Cholestyramine started on 10/13/2022  Octreotide  started 10/13/2022 VIP/celiac/5-HIAA Pancreatic elastase cc 25 low  Multiple electrolyte imbalance Hypokalemia, hypomagnesemia, hypophosphatemia Continue aggressive repletion as needed    History of PE-on  Eliquis   Normocytic anemia Iron deficiency anemia Baseline hemoglobin around 9 Continue oral iron supplementation   AKI on CKD stage IIIa Improving Continue IV fluids Daily BMP   History of multiple myeloma Metastatic bony lesion Follows with Dr. Candise Che, added to the treatment team On immunotherapy   Goals of care discussion Palliative consulted, patient full code Family would like to speak with oncology regarding treatment plan versus hospice    Pressure Injury 03/29/22 Buttocks Right Stage 1 -  Intact skin with non-blanchable redness of a localized area usually over a bony prominence. (Active)  03/29/22 2140  Location: Buttocks  Location Orientation: Right  Staging: Stage 1 -  Intact skin with non-blanchable redness of a localized area usually over a bony prominence.  Wound Description (Comments):   Present on Admission: Yes     Pressure Injury 05/28/22 Heel Right Unstageable - Full thickness tissue loss in which the base of the injury is covered by slough (yellow, tan, gray, green or brown) and/or eschar (tan, brown or black) in the wound bed. Open wound, broken from pressure & (Active)  05/28/22 1400  Location: Heel  Location Orientation: Right  Staging: Unstageable - Full thickness tissue loss in which the base of the injury is covered by slough (yellow, tan, gray, green or brown) and/or eschar (tan, brown or black) in the wound bed.  Wound Description (Comments): Open wound, broken from pressure & dry skin  Present on Admission: Yes     Pressure Injury 08/08/22 Buttocks Right;Left Deep Tissue Pressure Injury - Purple or maroon localized area of discolored intact skin or blood-filled blister due to damage of underlying soft tissue from pressure and/or shear. (Active)  08/08/22 1241  Location: Buttocks  Location Orientation: Right;Left  Staging: Deep Tissue Pressure Injury -  Purple or maroon localized area of discolored intact skin or blood-filled blister due to damage of  underlying soft tissue from pressure and/or shear.  Wound Description (Comments):   Present on Admission:     Estimated body mass index is 26.27 kg/m as calculated from the following:   Height as of this encounter: 5\' 9"  (1.753 m).   Weight as of this encounter: 80.7 kg.  DVT prophylaxis: Eliquis Code Status: Full Family Communication: None  disposition Plan:  Status is: Inpatient Remains inpatient appropriate because: Electrolite abnormalities and diarrhea   Consultants:  GI  Procedures: Rec tube  antimicrobials: None  Subjective: He is  resting in bed rectal tube in place with liquid stool Objective: Flexi-Seal in place with dark brown stool Vitals:   10/13/22 2123 10/14/22 0428 10/14/22 1957 10/15/22 0427  BP: (!) 151/62 (!) 144/81 138/83 (!) 148/65  Pulse: (!) 58 67 68 66  Resp: 18 18 18 18   Temp: 98.7 F (37.1 C) 98.4 F (36.9 C) 98.7 F (37.1 C) 98.1 F (36.7 C)  TempSrc: Oral Oral Oral Oral  SpO2: 100% 100% 100% 100%  Weight:      Height:        Intake/Output Summary (Last 24 hours) at 10/15/2022 1216 Last data filed at 10/15/2022 0900 Gross per 24 hour  Intake 354 ml  Output 850 ml  Net -496 ml   Filed Weights   10/08/22 1704 10/08/22 2249  Weight: 81.6 kg 80.7 kg    Examination:  General exam: Appears chronically ill-appearing Respiratory system: Clear to auscultation. Respiratory effort normal. Cardiovascular system: S1 & S2 heard, RRR. No JVD, murmurs, rubs, gallops or clicks. No pedal edema. Gastrointestinal system: Abdomen is nondistended, soft and nontender. No organomegaly or masses felt. Normal bowel sounds heard. Central nervous system: Alert and oriented. No focal neurological deficits. Extremities: no edema  Data Reviewed: I have personally reviewed following labs and imaging studies  CBC: Recent Labs  Lab 10/11/22 0659 10/12/22 0549 10/13/22 0542 10/14/22 0549 10/15/22 0527  WBC 6.2 6.3 6.5 6.2 6.8  NEUTROABS 3.7 3.7 3.9 3.6  4.5  HGB 8.3* 8.2* 8.5* 8.1* 8.0*  HCT 24.9* 25.0* 25.9* 24.8* 25.1*  MCV 88.9 89.9 90.6 88.9 91.9  PLT 315 274 282 260 249   Basic Metabolic Panel: Recent Labs  Lab 10/10/22 0611 10/11/22 0659 10/12/22 0549 10/13/22 0542 10/14/22 0549 10/15/22 0527  NA 142 140 141 138 135 136  K 3.1* 3.4* 3.4* 3.5 3.5 3.0*  CL 112* 113* 114* 113* 112* 111  CO2 19* 19* 17* 17* 17* 17*  GLUCOSE 88 86 81 79 120* 112*  BUN 11 7* 5* 5* <5* 5*  CREATININE 0.99 0.98 0.99 0.90 0.92 0.92  CALCIUM 7.5* 7.1* 7.3* 7.3* 6.9* 7.3*  MG 1.5* 1.9 1.5* 1.9 1.8 1.6*  PHOS 1.9*  --  1.8* 2.4* 2.9 2.3*   GFR: Estimated Creatinine Clearance: 63 mL/min (by C-G formula based on SCr of 0.92 mg/dL). Liver Function Tests: Recent Labs  Lab 10/08/22 1349 10/08/22 1822 10/09/22 1106 10/10/22 0611 10/11/22 0659  AST 24 27 22 22 17   ALT 16 21 17 14 14   ALKPHOS 134* 143* 123 107 107  BILITOT 0.6 0.7 0.6 0.6 0.6  PROT 6.1* 6.9 5.9* 5.4* 5.2*  ALBUMIN 3.7 3.8 3.2* 2.8* 2.8*   Recent Labs  Lab 10/08/22 1822  LIPASE 22   No results for input(s): "AMMONIA" in the last 168 hours. Coagulation Profile: No results for input(s): "INR", "PROTIME" in  the last 168 hours. Cardiac Enzymes: No results for input(s): "CKTOTAL", "CKMB", "CKMBINDEX", "TROPONINI" in the last 168 hours. BNP (last 3 results) No results for input(s): "PROBNP" in the last 8760 hours. HbA1C: No results for input(s): "HGBA1C" in the last 72 hours. CBG: No results for input(s): "GLUCAP" in the last 168 hours. Lipid Profile: No results for input(s): "CHOL", "HDL", "LDLCALC", "TRIG", "CHOLHDL", "LDLDIRECT" in the last 72 hours. Thyroid Function Tests: Recent Labs    10/13/22 0542  TSH 0.687   Anemia Panel: No results for input(s): "VITAMINB12", "FOLATE", "FERRITIN", "TIBC", "IRON", "RETICCTPCT" in the last 72 hours. Sepsis Labs: No results for input(s): "PROCALCITON", "LATICACIDVEN" in the last 168 hours.  Recent Results (from the past 240  hour(s))  Gastrointestinal Panel by PCR , Stool     Status: None   Collection Time: 10/09/22  2:10 PM   Specimen: Stool  Result Value Ref Range Status   Campylobacter species NOT DETECTED NOT DETECTED Final   Plesimonas shigelloides NOT DETECTED NOT DETECTED Final   Salmonella species NOT DETECTED NOT DETECTED Final   Yersinia enterocolitica NOT DETECTED NOT DETECTED Final   Vibrio species NOT DETECTED NOT DETECTED Final   Vibrio cholerae NOT DETECTED NOT DETECTED Final   Enteroaggregative E coli (EAEC) NOT DETECTED NOT DETECTED Final   Enteropathogenic E coli (EPEC) NOT DETECTED NOT DETECTED Final   Enterotoxigenic E coli (ETEC) NOT DETECTED NOT DETECTED Final   Shiga like toxin producing E coli (STEC) NOT DETECTED NOT DETECTED Final   Shigella/Enteroinvasive E coli (EIEC) NOT DETECTED NOT DETECTED Final   Cryptosporidium NOT DETECTED NOT DETECTED Final   Cyclospora cayetanensis NOT DETECTED NOT DETECTED Final   Entamoeba histolytica NOT DETECTED NOT DETECTED Final   Giardia lamblia NOT DETECTED NOT DETECTED Final   Adenovirus F40/41 NOT DETECTED NOT DETECTED Final   Astrovirus NOT DETECTED NOT DETECTED Final   Norovirus GI/GII NOT DETECTED NOT DETECTED Final   Rotavirus A NOT DETECTED NOT DETECTED Final   Sapovirus (I, II, IV, and V) NOT DETECTED NOT DETECTED Final    Comment: Performed at [facility], [address] [place], [place] [address]  C Difficile Quick Screen w PCR reflex     Status: None   Collection Time: 10/09/22  2:10 PM   Specimen: STOOL  Result Value Ref Range Status   C Diff antigen NEGATIVE NEGATIVE Final   C Diff toxin NEGATIVE NEGATIVE Final   C Diff interpretation No C. difficile detected.  Final    Comment: Performed at [facility], [address], [place], [place] [address]  Calprotectin, Fecal     Status: None   Collection Time: 10/10/22  5:15 PM   Specimen: Stool  Result Value Ref Range Status   Calprotectin, Fecal 12 0  - 120 ug/g Final    Comment: (NOTE) Concentration     Interpretation   Follow-Up < 5 - 50 ug/g     Normal           None >50 -120 ug/g     Borderline       Re-evaluate in 4-6 weeks    >120 ug/g     Abnormal         Repeat as clinically                                   indicated Performed At: [facility] [address] [place],  [place] [id number] Jolene Schimke MD WU:[id number]      Radiology Studies: No results found.   Scheduled Meds:  apixaban  5 mg Oral BID   cholestyramine light  4 g Oral Daily   famotidine  20 mg Oral BID   ferrous sulfate  325 mg Oral BID WC   lipase/protease/amylase  36,000 Units Oral TID AC   loperamide  2 mg Oral BID   mirtazapine  7.5 mg Oral QHS   octreotide  100 mcg Subcutaneous TID   Continuous Infusions:   LOS: 6 days   Time spent: 39 min Alwyn Ren, MD  10/15/2022, 12:16 PM

## 2022-10-15 NOTE — Progress Notes (Addendum)
Daily Progress Note  DOA: 10/08/2022 Hospital Day: 8  Chief Complaint: Diarrhea  ASSESSMENT & PLAN   Brief Narrative:  Frank Hernandez is a 82 y.o. 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 -Daratuminab doesn't seem to be culprit and diarrhea preceded immunotherapy.  -Neg recent GI pathogen, neg C diff. Prev EPEC 07/2022, Rxed with azithromycin x 3 days  -Negative fecal calprotectin -Neg Flex sig with Bx June 2024 (neg [place] red stain for amyloidosis, neg for immune mediated diarrhea) -Normal TSH -Pancreatic elastase is low but may be false results in setting of watery stool. H -CT AP on 5/30 >> dilated colon with air fluid levels ( chronic) -VIP is pending -Calcitonin pending -Gastrin pending -Microsporida pending -Celiac serologies are pending. -5 HIAA pending -Pantoprazole on hold, may be contributing to diarrhea,  -Has prn Loperamide ordered but wasn't getting it so changed to scheduled 2 mg BID on 8/15.  -Daily Cholestyramine started 8/14, dose increased to TID today -Today: Less than 100 ml dark watery stool in flexiseal. Leaked BM around flexiseal x 1 this am per nursing. I am unable to figure out totat stool output from yesterday. 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 Hgb overall stable at 8.    Subjective   No complaints.   Objective    Recent Labs    10/13/22 0542 10/14/22 0549 10/15/22 0527  WBC 6.5 6.2 6.8  HGB 8.5* 8.1* 8.0*  HCT 25.9* 24.8* 25.1*  PLT 282 260 249   BMET Recent Labs    10/13/22 0542 10/14/22 0549 10/15/22 0527  NA 138 135 136  K 3.5 3.5 3.0*  CL 113* 112* 111  CO2 17* 17* 17*  GLUCOSE 79 120* 112*  BUN 5* <5* 5*  CREATININE 0.90 0.92 0.92  CALCIUM 7.3* 6.9* 7.3*    LFT No results for input(s): "PROT", "ALBUMIN", "AST", "ALT", "ALKPHOS", "BILITOT", "BILIDIR", "IBILI" in the last 72 hours. PT/INR No results for input(s): "LABPROT", "INR" in the last 72 hours.   Imaging:  DG Abd 2 Views CLINICAL DATA:  Ogilvie syndrome common diarrhea  EXAM: ABDOMEN - 2 VIEW  COMPARISON:  KUB 08/06/2022, 08/04/2022, and priors  FINDINGS: There is significant gaseous distention of the colon measuring up to approximately 8.4 cm in the transverse colon, similar to the study from 08/04/2022. There is no definite free intraperitoneal air. There is no abnormal soft tissue calcification.  The imaged lungs clear. There is no acute osseous abnormality. Postsurgical changes are noted in both femurs.  IMPRESSION: Significant gaseous distention of the colon is similar to the study from 08/04/2022.  Electronically Signed   By: Lesia Hausen M.D.   On: 10/13/2022 11:33    Scheduled inpatient medications:   apixaban  5 mg Oral BID   cholestyramine light  4 g Oral TID   famotidine  20 mg Oral BID   ferrous sulfate  325 mg Oral BID WC   lipase/protease/amylase  36,000 Units Oral TID AC   loperamide  2 mg Oral BID   mirtazapine  7.5 mg Oral QHS   octreotide  100 mcg Subcutaneous TID   Continuous inpatient infusions:   magnesium sulfate bolus IVPB 2 g (10/15/22 1319)   potassium chloride  PRN inpatient medications: acetaminophen  Vital signs in last 24 hours: Temp:  [98.1 F (36.7 C)-98.7 F (37.1 C)] 98.1 F (36.7 C) (08/16 0427) Pulse Rate:  [66-68] 66 (08/16 0427) Resp:  [18] 18 (08/16 0427) BP: (138-148)/(65-83) 148/65 (08/16 0427) SpO2:  [100 %] 100 % (08/16 0427) Last BM Date : 10/14/22  Intake/Output Summary (Last 24 hours) at 10/15/2022 1332 Last data filed at 10/15/2022 0900 Gross per 24 hour  Intake 354 ml  Output 850 ml  Net -496 ml    Intake/Output from previous day: 08/15 0701 - 08/16 0700 In: 360 [P.O.:360] Out: 1450  [Urine:1450] Intake/Output this shift: Total I/O In: 234 [P.O.:234] Out: -    Physical Exam:  General: Alert male in NAD Heart:  Regular rate and rhythm.  Pulmonary: Normal respiratory effort Abdomen: Soft, nondistended, nontender. Normal bowel sounds. Neurologic: Alert and oriented Psych: Pleasant. Cooperative.  Principal Problem:   Hypokalemia Active Problems:   Essential hypertension   Normocytic anemia   Multiple myeloma not having achieved remission (HCC)   Acute renal failure superimposed on stage 3a chronic kidney disease (HCC)   Diarrhea   History of pulmonary embolism   Palliative care encounter   Need for emotional support     LOS: 6 days   Willette Cluster ,NP 10/15/2022, 1:32 PM   Attending physician's note   I have taken history, reviewed the chart and examined the patient. I performed a substantive portion of this encounter, including complete performance of at least one of the key components, in conjunction with the APP. I agree with the Advanced Practitioner's note, impression and recommendations.   Continued unexplained diarrhea with electrolyte Abn s/p Flexi-Seal-?etiology. Likely d/t amyloidosis -X-ray KUB-stable baseline colonic dil -Nl ESR, CRP (2.3), TSH, neg fecal calprotectin. Low fecal elastase (could be false +ve)  -Neg recent GI pathogens, neg C diff. 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 3 times daily -Scheduled loperamide 2mg  BID -Follow S. VIP, calcitonin, gastrin, celiac, 5HIAA -Follow additional stool studies.    Edman Circle, MD [facility] [phone]

## 2022-10-15 NOTE — Progress Notes (Signed)
Mobility Specialist - Progress Note   10/15/22 1122  Mobility  Activity Transferred from bed to chair  Level of Assistance Standby assist, set-up cues, supervision of patient - no hands on  Assistive Device Front wheel walker  Distance Ambulated (ft) 2 ft  Activity Response Tolerated well  Mobility Referral Yes  $Mobility charge 1 Mobility  Mobility Specialist Start Time (ACUTE ONLY) 1102  Mobility Specialist Stop Time (ACUTE ONLY) 1119  Mobility Specialist Time Calculation (min) (ACUTE ONLY) 17 min   Pt received in bed and agreeable to transfer to recliner. No complaints during transfer. Pt to recliner after session with all needs met. Chair alarm on.    [facility]

## 2022-10-15 NOTE — Care Management Important Message (Signed)
Important Message  Patient Details IM Letter given. Name: FATHI DESCHEPPER MRN: [id number] Date of Birth: Jul 31, 1940   Medicare Important Message Given:  Yes     Caren Macadam 10/15/2022, 4:12 PM

## 2022-10-15 NOTE — Progress Notes (Signed)
USGPIV started per IV team consult, but primary RN was notified that there are limited options remaining for future PIVs. Infiltration noted in RFA and LAC.  Pt is starting potassium run (6 total); spoke with RN about possible need for PICC line or alternative access.

## 2022-10-16 DIAGNOSIS — E876 Hypokalemia: Secondary | ICD-10-CM | POA: Diagnosis not present

## 2022-10-16 LAB — CBC WITH DIFFERENTIAL/PLATELET
Abs Immature Granulocytes: 0.02 10*3/uL (ref 0.00–0.07)
Basophils Absolute: 0 10*3/uL (ref 0.0–0.1)
Basophils Relative: 0 %
Eosinophils Absolute: 0.4 10*3/uL (ref 0.0–0.5)
Eosinophils Relative: 6 %
HCT: 26 % — ABNORMAL LOW (ref 39.0–52.0)
Hemoglobin: 8.3 g/dL — ABNORMAL LOW (ref 13.0–17.0)
Immature Granulocytes: 0 %
Lymphocytes Relative: 17 %
Lymphs Abs: 1.1 10*3/uL (ref 0.7–4.0)
MCH: 29.1 pg (ref 26.0–34.0)
MCHC: 31.9 g/dL (ref 30.0–36.0)
MCV: 91.2 fL (ref 80.0–100.0)
Monocytes Absolute: 1 10*3/uL (ref 0.1–1.0)
Monocytes Relative: 16 %
Neutro Abs: 3.9 10*3/uL (ref 1.7–7.7)
Neutrophils Relative %: 61 %
Platelets: 211 10*3/uL (ref 150–400)
RBC: 2.85 MIL/uL — ABNORMAL LOW (ref 4.22–5.81)
RDW: 16.3 % — ABNORMAL HIGH (ref 11.5–15.5)
WBC: 6.3 10*3/uL (ref 4.0–10.5)
nRBC: 0 % (ref 0.0–0.2)

## 2022-10-16 LAB — BASIC METABOLIC PANEL
Anion gap: 9 (ref 5–15)
BUN: 6 mg/dL — ABNORMAL LOW (ref 8–23)
CO2: 15 mmol/L — ABNORMAL LOW (ref 22–32)
Calcium: 7.3 mg/dL — ABNORMAL LOW (ref 8.9–10.3)
Chloride: 114 mmol/L — ABNORMAL HIGH (ref 98–111)
Creatinine, Ser: 0.94 mg/dL (ref 0.61–1.24)
GFR, Estimated: >60 mL/min (ref >60)
Glucose, Bld: 108 mg/dL — ABNORMAL HIGH (ref 70–99)
Potassium: 3.6 mmol/L (ref 3.5–5.1)
Sodium: 138 mmol/L (ref 135–145)

## 2022-10-16 LAB — PHOSPHORUS: Phosphorus: 1.6 mg/dL — ABNORMAL LOW (ref 2.5–4.6)

## 2022-10-16 LAB — MAGNESIUM: Magnesium: 1.8 mg/dL (ref 1.7–2.4)

## 2022-10-16 LAB — MISC LABCORP TEST (SEND OUT): Labcorp test code: 182204

## 2022-10-16 LAB — GLIA (IGA/G) + TTG IGA
Antigliadin Abs, IgA: 1 U (ref 0–19)
Gliadin IgG: 1 U (ref 0–19)
Tissue Transglutaminase Ab, IgA: <2 U/mL (ref 0–3)

## 2022-10-16 LAB — IGA: IgA: 36 mg/dL — ABNORMAL LOW (ref 61–437)

## 2022-10-16 MED ORDER — POTASSIUM PHOSPHATES 15 MMOLE/5ML IV SOLN
30.0000 mmol | Freq: Once | INTRAVENOUS | Status: AC
  Administered 2022-10-16: 30 mmol via INTRAVENOUS
  Filled 2022-10-16: qty 10

## 2022-10-16 MED ORDER — HYALURONIDASE HUMAN 150 UNIT/ML IJ SOLN
150.0000 [IU] | Freq: Once | INTRAMUSCULAR | Status: AC
  Administered 2022-10-16: 150 [IU] via SUBCUTANEOUS
  Filled 2022-10-16: qty 1

## 2022-10-16 MED ORDER — ORAL CARE MOUTH RINSE: 15.0000 mL | OROMUCOSAL | Status: DC | PRN

## 2022-10-16 NOTE — Progress Notes (Signed)
PROGRESS NOTE    Frank Hernandez  UJW:[id number] DOB: 07-18-1940 DOA: 10/08/2022 PCP: [name] (Inactive)   Brief Narrative: 82 year old with history of multiple myeloma, multiple bony metastatic lesion, normocytic anemia, CKD stage IIIa, DVT and PE on Eliquis presents from cancer center with severe ongoing hypokalemia.  Patient does have history of intractable diarrhea, recently admitted for hyperkalemia and AKI in the context of chronic diarrhea. Patient was recently diagnosed with enteropathogenic E. coli and he completed azithromycin therapy X 3 weeks, but continues to have diarrhea.  Flexible sigmoidoscopy on 6/10 with no definitive pathology. In the ED, noted multiple electrolyte imbalance.  Patient admitted for further management.    Assessment & Plan:   Principal Problem:   Hypokalemia Active Problems:   Diarrhea   Essential hypertension   Normocytic anemia   Multiple myeloma not having achieved remission (HCC)   Acute renal failure superimposed on stage 3a chronic kidney disease (HCC)   History of pulmonary embolism   Palliative care encounter   Need for emotional support   Chronic intractable diarrhea Recent enteropathogenic E. coli s/p 3 weeks of azithromycin Suspicion for amyloidosis of loose bowels but the [place] red stain was negative Possibly immunotherapy related or PPI related C. difficile, GI pathogen panel all negative Fecal calprotectin negative  Appreciate GI input.   GI thinks this is possibly related to PPI which has been stopped currently on Pepcid. Pancreatic fecal elastase level pending if low GI considering to start Creon Lomotil bid  KUB 10/13/2022-significant gaseous distention of the colon Cholestyramine started on 10/13/2022  Octreotide  started 10/13/2022 VIP/celiac/5-HIAA Pancreatic elastase cc 25 low  Multiple electrolyte imbalance Hypokalemia, hypomagnesemia, hypophosphatemia Continue aggressive repletion as needed    History of  PE-on Eliquis   Normocytic anemia Iron deficiency anemia Baseline hemoglobin around 9 Continue oral iron supplementation   AKI on CKD stage IIIa Improving Continue IV fluids Daily BMP   History of multiple myeloma Metastatic bony lesion Follows with Dr. Candise Che, added to the treatment team On immunotherapy   Goals of care discussion Palliative consulted, patient full code Family would like to speak with oncology regarding treatment plan versus hospice    Pressure Injury 03/29/22 Buttocks Right Stage 1 -  Intact skin with non-blanchable redness of a localized area usually over a bony prominence. (Active)  03/29/22 2140  Location: Buttocks  Location Orientation: Right  Staging: Stage 1 -  Intact skin with non-blanchable redness of a localized area usually over a bony prominence.  Wound Description (Comments):   Present on Admission: Yes     Pressure Injury 05/28/22 Heel Right Unstageable - Full thickness tissue loss in which the base of the injury is covered by slough (yellow, tan, gray, green or brown) and/or eschar (tan, brown or black) in the wound bed. Open wound, broken from pressure & (Active)  05/28/22 1400  Location: Heel  Location Orientation: Right  Staging: Unstageable - Full thickness tissue loss in which the base of the injury is covered by slough (yellow, tan, gray, green or brown) and/or eschar (tan, brown or black) in the wound bed.  Wound Description (Comments): Open wound, broken from pressure & dry skin  Present on Admission: Yes     Pressure Injury 08/08/22 Buttocks Right;Left Deep Tissue Pressure Injury - Purple or maroon localized area of discolored intact skin or blood-filled blister due to damage of underlying soft tissue from pressure and/or shear. (Active)  08/08/22 1241  Location: Buttocks  Location Orientation: Right;Left  Staging: Deep Tissue  Pressure Injury - Purple or maroon localized area of discolored intact skin or blood-filled blister due to  damage of underlying soft tissue from pressure and/or shear.  Wound Description (Comments):   Present on Admission:     Estimated body mass index is 26.27 kg/m as calculated from the following:   Height as of this encounter: 5\' 9"  (1.753 m).   Weight as of this encounter: 80.7 kg.  DVT prophylaxis: Eliquis Code Status: Full Family Communication: None  disposition Plan:  Status is: Inpatient Remains inpatient appropriate because: Electrolite abnormalities and diarrhea   Consultants:  GI  Procedures: Rec tube  antimicrobials: None  Subjective:   No new complaints no events overnight rectal tube in place with much less drainage compared to yesterday unclear how much is or was the output from the rectal tube overall he feels better than Objective: Flexi-Seal in place with dark brown stool Vitals:   10/15/22 1428 10/15/22 2054 10/16/22 0522 10/16/22 1257  BP: 133/63 (!) 149/79 135/83 134/81  Pulse: 69 68 70 63  Resp: 17 15 16 16   Temp: 99 F (37.2 C) 98.7 F (37.1 C) 98.6 F (37 C) 98.6 F (37 C)  TempSrc: Oral Oral Oral Oral  SpO2: 98% 100% 98% 100%  Weight:      Height:        Intake/Output Summary (Last 24 hours) at 10/16/2022 1414 Last data filed at 10/16/2022 0500 Gross per 24 hour  Intake 297.58 ml  Output 1750 ml  Net -1452.42 ml   Filed Weights   10/08/22 1704 10/08/22 2249  Weight: 81.6 kg 80.7 kg    Examination:  General exam: Appears chronically ill-appearing Respiratory system: Clear to auscultation. Respiratory effort normal. Cardiovascular system: S1 & S2 heard, RRR. No JVD, murmurs, rubs, gallops or clicks. No pedal edema. Gastrointestinal system: Abdomen is nondistended, soft and nontender. No organomegaly or masses felt. Normal bowel sounds heard. Central nervous system: Alert and oriented. No focal neurological deficits. Extremities: no edema  Data Reviewed: I have personally reviewed following labs and imaging studies  CBC: Recent Labs   Lab 10/12/22 0549 10/13/22 0542 10/14/22 0549 10/15/22 0527 10/16/22 0728  WBC 6.3 6.5 6.2 6.8 6.3  NEUTROABS 3.7 3.9 3.6 4.5 3.9  HGB 8.2* 8.5* 8.1* 8.0* 8.3*  HCT 25.0* 25.9* 24.8* 25.1* 26.0*  MCV 89.9 90.6 88.9 91.9 91.2  PLT 274 282 260 249 211   Basic Metabolic Panel: Recent Labs  Lab 10/12/22 0549 10/13/22 0542 10/14/22 0549 10/15/22 0527 10/16/22 0728  NA 141 138 135 136 138  K 3.4* 3.5 3.5 3.0* 3.6  CL 114* 113* 112* 111 114*  CO2 17* 17* 17* 17* 15*  GLUCOSE 81 79 120* 112* 108*  BUN 5* 5* <5* 5* 6*  CREATININE 0.99 0.90 0.92 0.92 0.94  CALCIUM 7.3* 7.3* 6.9* 7.3* 7.3*  MG 1.5* 1.9 1.8 1.6* 1.8  PHOS 1.8* 2.4* 2.9 2.3* 1.6*   GFR: Estimated Creatinine Clearance: 61.6 mL/min (by C-G formula based on SCr of 0.94 mg/dL). Liver Function Tests: Recent Labs  Lab 10/10/22 0611 10/11/22 0659  AST 22 17  ALT 14 14  ALKPHOS 107 107  BILITOT 0.6 0.6  PROT 5.4* 5.2*  ALBUMIN 2.8* 2.8*   No results for input(s): "LIPASE", "AMYLASE" in the last 168 hours.  No results for input(s): "AMMONIA" in the last 168 hours. Coagulation Profile: No results for input(s): "INR", "PROTIME" in the last 168 hours. Cardiac Enzymes: No results for input(s): "CKTOTAL", "CKMB", "CKMBINDEX", "TROPONINI"  in the last 168 hours. BNP (last 3 results) No results for input(s): "PROBNP" in the last 8760 hours. HbA1C: No results for input(s): "HGBA1C" in the last 72 hours. CBG: No results for input(s): "GLUCAP" in the last 168 hours. Lipid Profile: No results for input(s): "CHOL", "HDL", "LDLCALC", "TRIG", "CHOLHDL", "LDLDIRECT" in the last 72 hours. Thyroid Function Tests: No results for input(s): "TSH", "T4TOTAL", "FREET4", "T3FREE", "THYROIDAB" in the last 72 hours.  Anemia Panel: No results for input(s): "VITAMINB12", "FOLATE", "FERRITIN", "TIBC", "IRON", "RETICCTPCT" in the last 72 hours. Sepsis Labs: No results for input(s): "PROCALCITON", "LATICACIDVEN" in the last 168  hours.  Recent Results (from the past 240 hour(s))  Gastrointestinal Panel by PCR , Stool     Status: None   Collection Time: 10/09/22  2:10 PM   Specimen: Stool  Result Value Ref Range Status   Campylobacter species NOT DETECTED NOT DETECTED Final   Plesimonas shigelloides NOT DETECTED NOT DETECTED Final   Salmonella species NOT DETECTED NOT DETECTED Final   Yersinia enterocolitica NOT DETECTED NOT DETECTED Final   Vibrio species NOT DETECTED NOT DETECTED Final   Vibrio cholerae NOT DETECTED NOT DETECTED Final   Enteroaggregative E coli (EAEC) NOT DETECTED NOT DETECTED Final   Enteropathogenic E coli (EPEC) NOT DETECTED NOT DETECTED Final   Enterotoxigenic E coli (ETEC) NOT DETECTED NOT DETECTED Final   Shiga like toxin producing E coli (STEC) NOT DETECTED NOT DETECTED Final   Shigella/Enteroinvasive E coli (EIEC) NOT DETECTED NOT DETECTED Final   Cryptosporidium NOT DETECTED NOT DETECTED Final   Cyclospora cayetanensis NOT DETECTED NOT DETECTED Final   Entamoeba histolytica NOT DETECTED NOT DETECTED Final   Giardia lamblia NOT DETECTED NOT DETECTED Final   Adenovirus F40/41 NOT DETECTED NOT DETECTED Final   Astrovirus NOT DETECTED NOT DETECTED Final   Norovirus GI/GII NOT DETECTED NOT DETECTED Final   Rotavirus A NOT DETECTED NOT DETECTED Final   Sapovirus (I, II, IV, and V) NOT DETECTED NOT DETECTED Final    Comment: Performed at [facility], [address] [place], [place] [address]  C Difficile Quick Screen w PCR reflex     Status: None   Collection Time: 10/09/22  2:10 PM   Specimen: STOOL  Result Value Ref Range Status   C Diff antigen NEGATIVE NEGATIVE Final   C Diff toxin NEGATIVE NEGATIVE Final   C Diff interpretation No C. difficile detected.  Final    Comment: Performed at [facility], [address] [place], [place] [address]  Calprotectin, Fecal     Status: None   Collection Time: 10/10/22  5:15 PM   Specimen: Stool  Result Value  Ref Range Status   Calprotectin, Fecal 12 0 - 120 ug/g Final    Comment: (NOTE) Concentration     Interpretation   Follow-Up < 5 - 50 ug/g     Normal           None >50 -120 ug/g     Borderline       Re-evaluate in 4-6 weeks    >120 ug/g     Abnormal         Repeat as clinically                                   indicated Performed At: [facility] [address] [place], [place] [id number] Jolene Schimke MD [id number]  Radiology Studies: No results found.   Scheduled Meds:  apixaban  5 mg Oral BID   cholestyramine light  4 g Oral TID   famotidine  20 mg Oral BID   ferrous sulfate  325 mg Oral BID WC   lipase/protease/amylase  36,000 Units Oral TID AC   loperamide  2 mg Oral BID   mirtazapine  7.5 mg Oral QHS   octreotide  100 mcg Subcutaneous TID   Continuous Infusions:   LOS: 7 days   Time spent: 39 min Alwyn Ren, MD  10/16/2022, 2:14 PM

## 2022-10-16 NOTE — Plan of Care (Signed)
  Problem: Education: Goal: Knowledge of General Education information will improve Description: Including pain rating scale, medication(s)/side effects and non-pharmacologic comfort measures Outcome: Progressing   Problem: Education: Goal: Knowledge of General Education information will improve Description: Including pain rating scale, medication(s)/side effects and non-pharmacologic comfort measures Outcome: Progressing   Problem: Health Behavior/Discharge Planning: Goal: Ability to manage health-related needs will improve Outcome: Progressing   Problem: Health Behavior/Discharge Planning: Goal: Ability to manage health-related needs will improve Outcome: Progressing   Problem: Clinical Measurements: Goal: Ability to maintain clinical measurements within normal limits will improve Outcome: Progressing Goal: Will remain free from infection Outcome: Progressing Goal: Diagnostic test results will improve Outcome: Progressing Goal: Respiratory complications will improve Outcome: Progressing Goal: Cardiovascular complication will be avoided Outcome: Progressing   Problem: Clinical Measurements: Goal: Ability to maintain clinical measurements within normal limits will improve Outcome: Progressing   Problem: Clinical Measurements: Goal: Will remain free from infection Outcome: Progressing   Problem: Clinical Measurements: Goal: Will remain free from infection Outcome: Progressing   Problem: Clinical Measurements: Goal: Diagnostic test results will improve Outcome: Progressing   Problem: Clinical Measurements: Goal: Respiratory complications will improve Outcome: Progressing   Problem: Clinical Measurements: Goal: Cardiovascular complication will be avoided Outcome: Progressing   Problem: Activity: Goal: Risk for activity intolerance will decrease Outcome: Progressing   Problem: Activity: Goal: Risk for activity intolerance will decrease Outcome: Progressing    Problem: Nutrition: Goal: Adequate nutrition will be maintained Outcome: Progressing   Problem: Nutrition: Goal: Adequate nutrition will be maintained Outcome: Progressing   Problem: Coping: Goal: Level of anxiety will decrease Outcome: Progressing   Problem: Coping: Goal: Level of anxiety will decrease Outcome: Progressing   Problem: Elimination: Goal: Will not experience complications related to bowel motility Outcome: Progressing Goal: Will not experience complications related to urinary retention Outcome: Progressing   Problem: Elimination: Goal: Will not experience complications related to bowel motility Outcome: Progressing   Problem: Elimination: Goal: Will not experience complications related to urinary retention Outcome: Progressing   Problem: Safety: Goal: Ability to remain free from injury will improve Outcome: Progressing   Problem: Safety: Goal: Ability to remain free from injury will improve Outcome: Progressing   Problem: Skin Integrity: Goal: Risk for impaired skin integrity will decrease Outcome: Progressing   Problem: Skin Integrity: Goal: Risk for impaired skin integrity will decrease Outcome: Progressing

## 2022-10-16 NOTE — Progress Notes (Signed)
Another consult was placed to IV Therapy for new access; pt is requiring frequent iv restarts, almost daily due to infiltrations;  attempted x 1 with ultrasound in the RA[facility] without success; will have another IV nurse assess when able ; suggest central access for this pt; (also see PN from 10-15-22 from  VAS/IV Team).

## 2022-10-17 DIAGNOSIS — E876 Hypokalemia: Secondary | ICD-10-CM | POA: Diagnosis not present

## 2022-10-17 LAB — CBC WITH DIFFERENTIAL/PLATELET
Abs Immature Granulocytes: 0.03 10*3/uL (ref 0.00–0.07)
Basophils Absolute: 0 10*3/uL (ref 0.0–0.1)
Basophils Relative: 0 %
Eosinophils Absolute: 0.5 10*3/uL (ref 0.0–0.5)
Eosinophils Relative: 8 %
HCT: 25.6 % — ABNORMAL LOW (ref 39.0–52.0)
Hemoglobin: 8.2 g/dL — ABNORMAL LOW (ref 13.0–17.0)
Immature Granulocytes: 1 %
Lymphocytes Relative: 18 %
Lymphs Abs: 1.2 10*3/uL (ref 0.7–4.0)
MCH: 29.2 pg (ref 26.0–34.0)
MCHC: 32 g/dL (ref 30.0–36.0)
MCV: 91.1 fL (ref 80.0–100.0)
Monocytes Absolute: 1 10*3/uL (ref 0.1–1.0)
Monocytes Relative: 15 %
Neutro Abs: 3.8 10*3/uL (ref 1.7–7.7)
Neutrophils Relative %: 58 %
Platelets: 258 10*3/uL (ref 150–400)
RBC: 2.81 MIL/uL — ABNORMAL LOW (ref 4.22–5.81)
RDW: 16.2 % — ABNORMAL HIGH (ref 11.5–15.5)
WBC: 6.6 10*3/uL (ref 4.0–10.5)
nRBC: 0 % (ref 0.0–0.2)

## 2022-10-17 LAB — MAGNESIUM: Magnesium: 1.5 mg/dL — ABNORMAL LOW (ref 1.7–2.4)

## 2022-10-17 LAB — PHOSPHORUS: Phosphorus: 3.1 mg/dL (ref 2.5–4.6)

## 2022-10-17 MED ORDER — PSYLLIUM 95 % PO PACK
1.0000 | PACK | Freq: Every day | ORAL | Status: DC
  Administered 2022-10-17 – 2022-10-22 (×6): 1 via ORAL
  Filled 2022-10-17 (×5): qty 1

## 2022-10-17 MED ORDER — POTASSIUM CHLORIDE CRYS ER 20 MEQ PO TBCR
40.0000 meq | EXTENDED_RELEASE_TABLET | Freq: Once | ORAL | Status: AC
  Administered 2022-10-17: 40 meq via ORAL
  Filled 2022-10-17: qty 2

## 2022-10-17 MED ORDER — MAGNESIUM SULFATE 2 GM/50ML IV SOLN
2.0000 g | Freq: Once | INTRAVENOUS | Status: AC
  Administered 2022-10-17: 2 g via INTRAVENOUS
  Filled 2022-10-17: qty 50

## 2022-10-17 MED ORDER — LOPERAMIDE HCL 2 MG PO CAPS
2.0000 mg | ORAL_CAPSULE | Freq: Three times a day (TID) | ORAL | Status: DC
  Administered 2022-10-17 – 2022-10-22 (×14): 2 mg via ORAL
  Filled 2022-10-17 (×14): qty 1

## 2022-10-17 NOTE — Progress Notes (Signed)
     Progress Note    ASSESSMENT AND PLAN:   Continued unexplained diarrhea with electrolyte Abn s/p Flexi-Seal-?etiology. Likely d/t amyloidosis.  -X-ray KUB-stable baseline colon dil -Nl ESR, CRP (2.3), TSH, celiac.  Neg fecal calprotectin. Low fecal elastase (could be false +ve)  -Neg recent GI pathogens, neg C diff. 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 VIP, calcitonin, gastrin -Follow stool studies for microsporidia -Not sure if there is a role of EGD with SB bx (for [place] red staining).  Dr. Leone Payor taking over the service tomorrow. Will sign out to him.     SUBJECTIVE   Per patient the diarrhea is somewhat better Unable to quantify since he leaked around the Flexi-Seal tube. Appox 200 mL overnight. Dark due to iron No abdominal pain Tolerating p.o. well No nausea or vomiting.    OBJECTIVE:     Vital signs in last 24 hours: Temp:  [98 F (36.7 C)-98.6 F (37 C)] 98 F (36.7 C) (08/18 0536) Pulse Rate:  [58-67] 58 (08/18 0536) Resp:  [14-16] 16 (08/18 0536) BP: (124-136)/(71-81) 136/71 (08/18 0536) SpO2:  [98 %-100 %] 98 % (08/18 0536) Last BM Date : 10/16/22 General:   Alert, male in NAD. Has Flexi-Seal rectal tube.  Leaked around that.  Was being cleaned. Did not examine today Psych:  Pleasant, cooperative.  Normal mood and affect.   Intake/Output from previous day: 08/17 0701 - 08/18 0700 In: 0.8 [IV Piggyback:0.8] Out: 1250 [Urine:1050; Stool:200] Intake/Output this shift: Total I/O In: 240 [P.O.:240] Out: 400 [Urine:400]  Lab Results: Recent Labs    10/15/22 0527 10/16/22 0728 10/17/22 0541  WBC 6.8 6.3 6.6  HGB 8.0*  8.3* 8.2*  HCT 25.1* 26.0* 25.6*  PLT 249 211 258   BMET Recent Labs    10/15/22 0527 10/16/22 0728  NA 136 138  K 3.0* 3.6  CL 111 114*  CO2 17* 15*  GLUCOSE 112* 108*  BUN 5* 6*  CREATININE 0.92 0.94  CALCIUM 7.3* 7.3*   LFT No results for input(s): "PROT", "ALBUMIN", "AST", "ALT", "ALKPHOS", "BILITOT", "BILIDIR", "IBILI" in the last 72 hours. PT/INR No results for input(s): "LABPROT", "INR" in the last 72 hours. Hepatitis Panel No results for input(s): "HEPBSAG", "HCVAB", "HEPAIGM", "HEPBIGM" in the last 72 hours.  No results found.   Principal Problem:   Hypokalemia Active Problems:   Essential hypertension   Normocytic anemia   Multiple myeloma not having achieved remission (HCC)   Acute renal failure superimposed on stage 3a chronic kidney disease (HCC)   Diarrhea   History of pulmonary embolism   Palliative care encounter   Need for emotional support     LOS: 8 days     Edman Circle, MD 10/17/2022, 11:49 AM [facility] [phone]

## 2022-10-17 NOTE — Progress Notes (Signed)
PROGRESS NOTE    Frank Hernandez  XBJ:[id number] DOB: [birth year] DOA: 10/08/2022 PCP: [name] (Inactive)   Brief Narrative: 82 year old with history of multiple myeloma, multiple bony metastatic lesion, normocytic anemia, CKD stage IIIa, DVT and PE on Eliquis presents from cancer center with severe ongoing hypokalemia.  Patient does have history of intractable diarrhea, recently admitted for hyperkalemia and AKI in the context of chronic diarrhea. Patient was recently diagnosed with enteropathogenic E. coli and he completed azithromycin therapy X 3 weeks, but continues to have diarrhea.  Flexible sigmoidoscopy on 6/10 with no definitive pathology. In the ED, noted multiple electrolyte imbalance.  Patient admitted for further management.   Assessment & Plan:   Principal Problem:   Hypokalemia Active Problems:   Diarrhea   Essential hypertension   Normocytic anemia   Multiple myeloma not having achieved remission (HCC)   Acute renal failure superimposed on stage 3a chronic kidney disease (HCC)   History of pulmonary embolism   Palliative care encounter   Need for emotional support   Chronic intractable diarrhea Recent enteropathogenic E. coli s/p 3 weeks of azithromycin Suspicion for amyloidosis of loose bowels but the [place] red stain was negative rectal biopsy. Possibly immunotherapy related or PPI related C. difficile, GI pathogen panel all negative Fecal calprotectin negative  Appreciate GI input.   GI thinks this is possibly related to PPI which has been stopped currently on Pepcid (stopped 10/17/2022) Pancreatic fecal elastase level pending if low GI considering to start Creon Lomotil tid Added Metamucil 10/17/2022  KUB 10/13/2022-significant gaseous distention of the colon Cholestyramine started on 10/13/2022  Octreotide  started 10/13/2022 VIP/celiac/5-HIAA pending  Multiple electrolyte imbalance Hypokalemia, hypomagnesemia, hypophosphatemia Continue aggressive  repletion as needed    History of PE-on Eliquis   Normocytic anemia Iron deficiency anemia Baseline hemoglobin around 9 Continue oral iron supplementation   AKI on CKD stage IIIa resolved   History of multiple myeloma Metastatic bony lesion Follows with Dr. Candise Che On immunotherapy   Goals of care discussion Palliative consulted, patient full code Family would like to speak with oncology regarding treatment plan versus hospice    Pressure Injury 03/29/22 Buttocks Right Stage 1 -  Intact skin with non-blanchable redness of a localized area usually over a bony prominence. (Active)  03/29/22 2140  Location: Buttocks  Location Orientation: Right  Staging: Stage 1 -  Intact skin with non-blanchable redness of a localized area usually over a bony prominence.  Wound Description (Comments):   Present on Admission: Yes     Pressure Injury 05/28/22 Heel Right Unstageable - Full thickness tissue loss in which the base of the injury is covered by slough (yellow, tan, gray, green or brown) and/or eschar (tan, brown or black) in the wound bed. Open wound, broken from pressure & (Active)  05/28/22 1400  Location: Heel  Location Orientation: Right  Staging: Unstageable - Full thickness tissue loss in which the base of the injury is covered by slough (yellow, tan, gray, green or brown) and/or eschar (tan, brown or black) in the wound bed.  Wound Description (Comments): Open wound, broken from pressure & dry skin  Present on Admission: Yes     Pressure Injury 08/08/22 Buttocks Right;Left Deep Tissue Pressure Injury - Purple or maroon localized area of discolored intact skin or blood-filled blister due to damage of underlying soft tissue from pressure and/or shear. (Active)  08/08/22 1241  Location: Buttocks  Location Orientation: Right;Left  Staging: Deep Tissue Pressure Injury - Purple or maroon localized area  of discolored intact skin or blood-filled blister due to damage of underlying soft  tissue from pressure and/or shear.  Wound Description (Comments):   Present on Admission:     Estimated body mass index is 26.27 kg/m as calculated from the following:   Height as of this encounter: 5\' 9"  (1.753 m).   Weight as of this encounter: 80.7 kg.  DVT prophylaxis: Eliquis Code Status: Full Family Communication: None  disposition Plan:  Status is: Inpatient Remains inpatient appropriate because: Electrolite abnormalities and diarrhea   Consultants:  GI  Procedures: Rec tube  antimicrobials: None  Subjective: Resting in bed ate breakfast rectal tube in place bag with liquid stool Objective: Flexi-Seal in place with dark brown stool Vitals:   10/16/22 0522 10/16/22 1257 10/16/22 2041 10/17/22 0536  BP: 135/83 134/81 124/80 136/71  Pulse: 70 63 67 (!) 58  Resp: 16 16 14 16   Temp: 98.6 F (37 C) 98.6 F (37 C) 98.2 F (36.8 C) 98 F (36.7 C)  TempSrc: Oral Oral Oral Oral  SpO2: 98% 100% 98% 98%  Weight:      Height:        Intake/Output Summary (Last 24 hours) at 10/17/2022 1130 Last data filed at 10/17/2022 1053 Gross per 24 hour  Intake 240.8 ml  Output 1650 ml  Net -1409.2 ml   Filed Weights   10/08/22 1704 10/08/22 2249  Weight: 81.6 kg 80.7 kg    Examination:  General exam: Appears chronically ill-appearing Respiratory system: Clear to auscultation. Respiratory effort normal. Cardiovascular system: S1 & S2 heard, RRR. No JVD, murmurs, rubs, gallops or clicks. No pedal edema. Gastrointestinal system: Abdomen is nondistended, soft and nontender. No organomegaly or masses felt. Normal bowel sounds heard. Central nervous system: Alert and oriented. No focal neurological deficits. Extremities: no edema  Data Reviewed: I have personally reviewed following labs and imaging studies  CBC: Recent Labs  Lab 10/13/22 0542 10/14/22 0549 10/15/22 0527 10/16/22 0728 10/17/22 0541  WBC 6.5 6.2 6.8 6.3 6.6  NEUTROABS 3.9 3.6 4.5 3.9 3.8  HGB 8.5* 8.1*  8.0* 8.3* 8.2*  HCT 25.9* 24.8* 25.1* 26.0* 25.6*  MCV 90.6 88.9 91.9 91.2 91.1  PLT 282 260 249 211 258   Basic Metabolic Panel: Recent Labs  Lab 10/12/22 0549 10/13/22 0542 10/14/22 0549 10/15/22 0527 10/16/22 0728 10/17/22 0541  NA 141 138 135 136 138  --   K 3.4* 3.5 3.5 3.0* 3.6  --   CL 114* 113* 112* 111 114*  --   CO2 17* 17* 17* 17* 15*  --   GLUCOSE 81 79 120* 112* 108*  --   BUN 5* 5* <5* 5* 6*  --   CREATININE 0.99 0.90 0.92 0.92 0.94  --   CALCIUM 7.3* 7.3* 6.9* 7.3* 7.3*  --   MG 1.5* 1.9 1.8 1.6* 1.8 1.5*  PHOS 1.8* 2.4* 2.9 2.3* 1.6* 3.1   GFR: Estimated Creatinine Clearance: 61.6 mL/min (by C-G formula based on SCr of 0.94 mg/dL). Liver Function Tests: Recent Labs  Lab 10/11/22 0659  AST 17  ALT 14  ALKPHOS 107  BILITOT 0.6  PROT 5.2*  ALBUMIN 2.8*   No results for input(s): "LIPASE", "AMYLASE" in the last 168 hours.  No results for input(s): "AMMONIA" in the last 168 hours. Coagulation Profile: No results for input(s): "INR", "PROTIME" in the last 168 hours. Cardiac Enzymes: No results for input(s): "CKTOTAL", "CKMB", "CKMBINDEX", "TROPONINI" in the last 168 hours. BNP (last 3 results) No results  for input(s): "PROBNP" in the last 8760 hours. HbA1C: No results for input(s): "HGBA1C" in the last 72 hours. CBG: No results for input(s): "GLUCAP" in the last 168 hours. Lipid Profile: No results for input(s): "CHOL", "HDL", "LDLCALC", "TRIG", "CHOLHDL", "LDLDIRECT" in the last 72 hours. Thyroid Function Tests: No results for input(s): "TSH", "T4TOTAL", "FREET4", "T3FREE", "THYROIDAB" in the last 72 hours.  Anemia Panel: No results for input(s): "VITAMINB12", "FOLATE", "FERRITIN", "TIBC", "IRON", "RETICCTPCT" in the last 72 hours. Sepsis Labs: No results for input(s): "PROCALCITON", "LATICACIDVEN" in the last 168 hours.  Recent Results (from the past 240 hour(s))  Gastrointestinal Panel by PCR , Stool     Status: None   Collection Time:  10/09/22  2:10 PM   Specimen: Stool  Result Value Ref Range Status   Campylobacter species NOT DETECTED NOT DETECTED Final   Plesimonas shigelloides NOT DETECTED NOT DETECTED Final   Salmonella species NOT DETECTED NOT DETECTED Final   Yersinia enterocolitica NOT DETECTED NOT DETECTED Final   Vibrio species NOT DETECTED NOT DETECTED Final   Vibrio cholerae NOT DETECTED NOT DETECTED Final   Enteroaggregative E coli (EAEC) NOT DETECTED NOT DETECTED Final   Enteropathogenic E coli (EPEC) NOT DETECTED NOT DETECTED Final   Enterotoxigenic E coli (ETEC) NOT DETECTED NOT DETECTED Final   Shiga like toxin producing E coli (STEC) NOT DETECTED NOT DETECTED Final   Shigella/Enteroinvasive E coli (EIEC) NOT DETECTED NOT DETECTED Final   Cryptosporidium NOT DETECTED NOT DETECTED Final   Cyclospora cayetanensis NOT DETECTED NOT DETECTED Final   Entamoeba histolytica NOT DETECTED NOT DETECTED Final   Giardia lamblia NOT DETECTED NOT DETECTED Final   Adenovirus F40/41 NOT DETECTED NOT DETECTED Final   Astrovirus NOT DETECTED NOT DETECTED Final   Norovirus GI/GII NOT DETECTED NOT DETECTED Final   Rotavirus A NOT DETECTED NOT DETECTED Final   Sapovirus (I, II, IV, and V) NOT DETECTED NOT DETECTED Final    Comment: Performed at [facility], [address] [place], [place] [address]  C Difficile Quick Screen w PCR reflex     Status: None   Collection Time: 10/09/22  2:10 PM   Specimen: STOOL  Result Value Ref Range Status   C Diff antigen NEGATIVE NEGATIVE Final   C Diff toxin NEGATIVE NEGATIVE Final   C Diff interpretation No C. difficile detected.  Final    Comment: Performed at [facility], [address] [place], [place] [address]  Calprotectin, Fecal     Status: None   Collection Time: 10/10/22  5:15 PM   Specimen: Stool  Result Value Ref Range Status   Calprotectin, Fecal 12 0 - 120 ug/g Final    Comment: (NOTE) Concentration     Interpretation   Follow-Up <  5 - 50 ug/g     Normal           None >50 -120 ug/g     Borderline       Re-evaluate in 4-6 weeks    >120 ug/g     Abnormal         Repeat as clinically                                   indicated Performed At: [facility] [address] [place], [place] [id number] Jolene Schimke MD WG:[id number]      Radiology Studies: No results found.   Scheduled Meds:  apixaban  5 mg Oral BID   cholestyramine light  4 g Oral TID   famotidine  20 mg Oral BID   ferrous sulfate  325 mg Oral BID WC   lipase/protease/amylase  36,000 Units Oral TID AC   loperamide  2 mg Oral TID   mirtazapine  7.5 mg Oral QHS   octreotide  100 mcg Subcutaneous TID   psyllium  1 packet Oral Daily   Continuous Infusions:   LOS: 8 days   Time spent: 39 min Alwyn Ren, MD  10/17/2022, 11:30 AM

## 2022-10-17 NOTE — Plan of Care (Signed)

## 2022-10-17 NOTE — Plan of Care (Signed)
  Problem: Education: Goal: Knowledge of General Education information will improve Description: Including pain rating scale, medication(s)/side effects and non-pharmacologic comfort measures Outcome: Progressing   Problem: Health Behavior/Discharge Planning: Goal: Ability to manage health-related needs will improve Outcome: Progressing   Problem: Clinical Measurements: Goal: Ability to maintain clinical measurements within normal limits will improve Outcome: Progressing Goal: Will remain free from infection Outcome: Progressing Goal: Diagnostic test results will improve Outcome: Progressing Goal: Respiratory complications will improve Outcome: Progressing Goal: Cardiovascular complication will be avoided Outcome: Progressing   Problem: Activity: Goal: Risk for activity intolerance will decrease Outcome: Progressing   Problem: Nutrition: Goal: Adequate nutrition will be maintained Outcome: Progressing   Problem: Coping: Goal: Level of anxiety will decrease Outcome: Progressing   Problem: Elimination: Goal: Will not experience complications related to urinary retention Outcome: Progressing   Problem: Safety: Goal: Ability to remain free from injury will improve Outcome: Progressing   Problem: Skin Integrity: Goal: Risk for impaired skin integrity will decrease Outcome: Progressing   

## 2022-10-18 DIAGNOSIS — Z7901 Long term (current) use of anticoagulants: Secondary | ICD-10-CM | POA: Diagnosis not present

## 2022-10-18 DIAGNOSIS — I2699 Other pulmonary embolism without acute cor pulmonale: Secondary | ICD-10-CM

## 2022-10-18 DIAGNOSIS — E876 Hypokalemia: Secondary | ICD-10-CM | POA: Diagnosis not present

## 2022-10-18 DIAGNOSIS — K529 Noninfective gastroenteritis and colitis, unspecified: Secondary | ICD-10-CM | POA: Diagnosis not present

## 2022-10-18 DIAGNOSIS — C9000 Multiple myeloma not having achieved remission: Secondary | ICD-10-CM | POA: Diagnosis not present

## 2022-10-18 DIAGNOSIS — K6389 Other specified diseases of intestine: Secondary | ICD-10-CM

## 2022-10-18 LAB — GASTRIN: Gastrin: 20 pg/mL (ref 0–115)

## 2022-10-18 LAB — CALCITONIN: Calcitonin: <2 pg/mL (ref 0.0–8.4)

## 2022-10-18 LAB — CBC WITH DIFFERENTIAL/PLATELET
Abs Immature Granulocytes: 0.02 10*3/uL (ref 0.00–0.07)
Basophils Absolute: 0 10*3/uL (ref 0.0–0.1)
Basophils Relative: 1 %
Eosinophils Absolute: 0.5 10*3/uL (ref 0.0–0.5)
Eosinophils Relative: 8 %
HCT: 26.4 % — ABNORMAL LOW (ref 39.0–52.0)
Hemoglobin: 8.2 g/dL — ABNORMAL LOW (ref 13.0–17.0)
Immature Granulocytes: 0 %
Lymphocytes Relative: 17 %
Lymphs Abs: 1.1 10*3/uL (ref 0.7–4.0)
MCH: 29.3 pg (ref 26.0–34.0)
MCHC: 31.1 g/dL (ref 30.0–36.0)
MCV: 94.3 fL (ref 80.0–100.0)
Monocytes Absolute: 0.9 10*3/uL (ref 0.1–1.0)
Monocytes Relative: 15 %
Neutro Abs: 3.7 10*3/uL (ref 1.7–7.7)
Neutrophils Relative %: 59 %
Platelets: 234 10*3/uL (ref 150–400)
RBC: 2.8 MIL/uL — ABNORMAL LOW (ref 4.22–5.81)
RDW: 16.6 % — ABNORMAL HIGH (ref 11.5–15.5)
WBC: 6.3 10*3/uL (ref 4.0–10.5)
nRBC: 0 % (ref 0.0–0.2)

## 2022-10-18 LAB — BASIC METABOLIC PANEL
Anion gap: 5 (ref 5–15)
BUN: <5 mg/dL — ABNORMAL LOW (ref 8–23)
CO2: 17 mmol/L — ABNORMAL LOW (ref 22–32)
Calcium: 7.4 mg/dL — ABNORMAL LOW (ref 8.9–10.3)
Chloride: 118 mmol/L — ABNORMAL HIGH (ref 98–111)
Creatinine, Ser: 1.01 mg/dL (ref 0.61–1.24)
GFR, Estimated: >60 mL/min (ref >60)
Glucose, Bld: 101 mg/dL — ABNORMAL HIGH (ref 70–99)
Potassium: 4.5 mmol/L (ref 3.5–5.1)
Sodium: 140 mmol/L (ref 135–145)

## 2022-10-18 LAB — MAGNESIUM: Magnesium: 1.9 mg/dL (ref 1.7–2.4)

## 2022-10-18 LAB — PHOSPHORUS: Phosphorus: 2.1 mg/dL — ABNORMAL LOW (ref 2.5–4.6)

## 2022-10-18 NOTE — Consult Note (Signed)
   [facility] Inpatient Consult   10/18/2022  DELIA BEDILLION 16-May-1940 [id number]  [facility] [[facility]]  Accountable Care Organization [ACO] Patient: [employer] Medicare insurance  [facility] Liaison remote coverage review for patient admitted to [facility]    Primary Care Provider:  [name] (Inactive) noted [facility], FP with [facility] is listed to provide the transition of care appointments  Patient screened for less than 30 days readmission with 5 hospitalizations in [facility] in the past 6 months noted extreme high risk score for unplanned readmission risk 9 day length of stay.  Reviewed to assess for potential [facility]  [[facility]] Care Management service needs for post hospital transition for care coordination.  Review of patient's electronic medical record reveals patient is being recommended for a SNF level of care however inpatient [facility] team notes patient and wife are deciding for home with [facility] and palliative care.  Patient is also followed by the [facility] oncology team as well.   Plan:  Continue to follow progress and disposition to assess for post hospital community care coordination/management needs.  Referral request for community care coordination: pending disposition needs.   Of note, [facility] Care Management/Population Health does not replace or interfere with any arrangements made by the Inpatient Transition of Care team.  For questions contact:   Charlesetta Shanks, RN BSN CCM [facility]  [phone] business mobile phone Toll free office [phone]  Fax number: [phone] [email or web address]

## 2022-10-18 NOTE — Progress Notes (Addendum)
Progress Note   LOS: 9 days   Chief Complaint: Unexplained diarrhea   Subjective   Daily output stool 500 mL via Flexi-Seal.  Improvement compared to previous days.  Patient notes improvement.  Denies abdominal pain, nausea, vomiting.  Tolerating diet without difficulty.    Objective   Vital signs in last 24 hours: Temp:  [98.2 F (36.8 C)-98.5 F (36.9 C)] 98.3 F (36.8 C) (08/19 0458) Pulse Rate:  [64-70] 66 (08/19 0458) Resp:  [15-17] 15 (08/19 0458) BP: (157-163)/(79-87) 157/82 (08/19 0458) SpO2:  [100 %] 100 % (08/19 0458) Last BM Date : 10/18/22 Last BM recorded by nurses in past 5 days Stool Type: Type 7 (Liquid consistency with no solid pieces) (10/18/2022  9:00 AM)  General:   male in no acute distress Heart:  Regular rate and rhythm; no murmurs Pulm: Clear anteriorly; no wheezing Abdomen: soft, nondistended, normal bowel sounds in all quadrants. Nontender without guarding. No organomegaly appreciated.  Dark liquid stool per Flexi-Seal Extremities:  No edema Neurologic:  Alert and  oriented x4;  No focal deficits.  Psych:  Cooperative. Normal mood and affect.  Intake/Output from previous day: 08/18 0701 - 08/19 0700 In: 477 [P.O.:477] Out: 2000 [Urine:1500; Stool:500] Intake/Output this shift: Total I/O In: 360 [P.O.:360] Out: 400 [Urine:400]  Studies/Results: No results found.  Lab Results: Recent Labs    10/16/22 0728 10/17/22 0541 10/18/22 0524  WBC 6.3 6.6 6.3  HGB 8.3* 8.2* 8.2*  HCT 26.0* 25.6* 26.4*  PLT 211 258 234   BMET Recent Labs    10/16/22 0728 10/18/22 0524  NA 138 140  K 3.6 4.5  CL 114* 118*  CO2 15* 17*  GLUCOSE 108* 101*  BUN 6* <5*  CREATININE 0.94 1.01  CALCIUM 7.3* 7.4*   LFT No results for input(s): "PROT", "ALBUMIN", "AST", "ALT", "ALKPHOS", "BILITOT", "BILIDIR", "IBILI" in the last 72 hours. PT/INR No results for input(s): "LABPROT", "INR" in the last 72 hours.   Scheduled Meds:  apixaban  5 mg Oral BID    cholestyramine light  4 g Oral TID   ferrous sulfate  325 mg Oral BID WC   lipase/protease/amylase  36,000 Units Oral TID AC   loperamide  2 mg Oral TID   mirtazapine  7.5 mg Oral QHS   octreotide  100 mcg Subcutaneous TID   psyllium  1 packet Oral Daily   Continuous Infusions:    Patient profile:   82 y.o. male with a past medical history of a pathological fracture of the right femur secondary to multiple myeloma with AL amyloidosis initially diagnosed 03/2022, CKD stage III, PE/DVT on 07/2022 on Eliquis. Admitted to the hospital with hypokalemia and chronic diarrhea. No abdominal pain. GI pathogen panel 10/09/2022 was negative. C. Diff antigen and toxin negative.  Flexible sigmoidoscopy 08/09/2022 was unrevealing, no evidence of colitis or IBD. CTAP 07/29/2022 showed mild global pancreatic atrophy.     Impression:   Chronic diarrhea -Negative GI pathogen panel, C. difficile.  Previous EPEC 07/2022 treated with azithromycin x 3 days - Negative flex sig with biopsy - Normal ESR/CRP, TSH, celiac.  Negative fecal calprotectin.  Low fecal elastase. 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 VIP, calcitonin, gastrin --- Follow stool studies for microsporidia --- Follow electrolytes and correct as needed  Bayley Leanna Sato  10/18/2022, 11:19 AM     [facility] Attending   I have taken an interval history, reviewed the chart and examined the patient. I agree with the Advanced Practitioner's note, impression and recommendations with the following additions:  I suspect the low pancreatic elastase is dilutional and doubt pancreatic insufficiency. I have to wonder about  amyloidosis of te GI tract. ? If it was missed on prior bxs (sampling error) at flex sig.  I have recommended EGD and flex sig. He became upset and tearful - I explained he would be sedated and unaware. I am not clear as to why he was reluctant - he said he will think about it. There is not likely to be time to do it tomorrow anyway. I think the flex sig could be done unprepped.  Another possible cause of diarrhea in amyloidosis is autonomic neuropathy.  He has had diffuse colonic dilation since May for unclear reasons.  Iva Boop, MD, [facility] See Loretha Stapler on call - gastroenterology for best contact person 10/18/2022 3:10 PM

## 2022-10-18 NOTE — Progress Notes (Signed)
PT Note  Patient Details Name: Frank Hernandez MRN: [id number] DOB: Aug 16, 1940   Cancelled Treatment:    Reason Eval/Treat Not Completed: Patient declined, no reason specified. Pt declined to get OOB and sit in recliner or ambulate today. PT arrived x 2 for intervention [id number] with MD present and PT returned at 1649 and pt declined as above. Pt may transition to palliative care vs home with [facility] services. PT to continue to follow acutely.   Johnny Bridge, PT Acute Rehab   Jacqualyn Posey 10/18/2022, 5:55 PM

## 2022-10-18 NOTE — Progress Notes (Signed)
PROGRESS NOTE    Frank Hernandez  ZOX:[id number] DOB: 08-Jan-1941 DOA: 10/08/2022 PCP: Frank Hernandez (Inactive)   Brief Narrative: 82 year old with history of multiple myeloma, multiple bony metastatic lesion, normocytic anemia, CKD stage IIIa, DVT and PE on Eliquis presents from cancer center with severe ongoing hypokalemia.  Patient does have history of intractable diarrhea, recently admitted for hyperkalemia and AKI in the context of chronic diarrhea. Patient was recently diagnosed with enteropathogenic E. coli and he completed azithromycin therapy X 3 weeks, but continues to have diarrhea.  Flexible sigmoidoscopy on 6/10 with no definitive pathology. In the ED, noted multiple electrolyte imbalance.  Patient admitted for further management.   Assessment & Plan:   Principal Problem:   Hypokalemia Active Problems:   Diarrhea   Essential hypertension   Normocytic anemia   Multiple myeloma not having achieved remission (HCC)   Acute renal failure superimposed on stage 3a chronic kidney disease (HCC)   History of pulmonary embolism   Palliative care encounter   Need for emotional support   Chronic intractable diarrhea-unknown etiology.  Being treated with multiple agents.  Initial thought was this was related to PPI so PPI was stopped, immunotherapy related? Creon started due to low pancreatic fecal elastase.  Lomotil 3 times daily, Metamucil, cholestyramine, Creon,. Recent enteropathogenic E. coli s/p azithromycin Suspicion for amyloidosis of loose bowels but the [place] red stain was negative rectal biopsy.C. difficile, GI pathogen panel all negative Fecal calprotectin negative  GI following. KUB 10/13/2022-significant gaseous distention of the colon Cholestyramine started on 10/13/2022  Octreotide  started 10/13/2022 VIP/celiac/5-HIAA pending  Multiple electrolyte imbalance Hypokalemia, hypomagnesemia, hypophosphatemia Continue aggressive repletion as needed    History of PE-on  Eliquis   Normocytic anemia Iron deficiency anemia Baseline hemoglobin around 9 Continue oral iron supplementation   AKI on CKD stage IIIa resolved   History of multiple myeloma Metastatic bony lesion Follows with Dr. Candise Che On immunotherapy   Goals of care discussion Palliative consulted, patient full code Family would like to speak with oncology regarding treatment plan versus hospice    Pressure Injury 03/29/22 Buttocks Right Stage 1 -  Intact skin with non-blanchable redness of a localized area usually over a bony prominence. (Active)  03/29/22 2140  Location: Buttocks  Location Orientation: Right  Staging: Stage 1 -  Intact skin with non-blanchable redness of a localized area usually over a bony prominence.  Wound Description (Comments):   Present on Admission: Yes     Pressure Injury 05/28/22 Heel Right Unstageable - Full thickness tissue loss in which the base of the injury is covered by slough (yellow, tan, gray, green or brown) and/or eschar (tan, brown or black) in the wound bed. Open wound, broken from pressure & (Active)  05/28/22 1400  Location: Heel  Location Orientation: Right  Staging: Unstageable - Full thickness tissue loss in which the base of the injury is covered by slough (yellow, tan, gray, green or brown) and/or eschar (tan, brown or black) in the wound bed.  Wound Description (Comments): Open wound, broken from pressure & dry skin  Present on Admission: Yes     Pressure Injury 08/08/22 Buttocks Right;Left Deep Tissue Pressure Injury - Purple or maroon localized area of discolored intact skin or blood-filled blister due to damage of underlying soft tissue from pressure and/or shear. (Active)  08/08/22 1241  Location: Buttocks  Location Orientation: Right;Left  Staging: Deep Tissue Pressure Injury - Purple or maroon localized area of discolored intact skin or blood-filled blister due to damage  of underlying soft tissue from pressure and/or shear.  Wound  Description (Comments):   Present on Admission:     Estimated body mass index is 26.27 kg/m as calculated from the following:   Height as of this encounter: 5\' 9"  (1.753 m).   Weight as of this encounter: 80.7 kg.  DVT prophylaxis: Eliquis Code Status: Full Family Communication: None  disposition Plan:  Status is: Inpatient Remains inpatient appropriate because: Electrolite abnormalities and diarrhea   Consultants:  GI  Procedures: Rec tube  antimicrobials: None  Subjective:  Rectal tube still with liquid stool though the amount is less than before  Objective: Flexi-Seal in place with dark brown stool Vitals:   10/17/22 0536 10/17/22 1317 10/17/22 2102 10/18/22 0458  BP: 136/71 (!) 163/87 (!) 160/79 (!) 157/82  Pulse: (!) 58 70 64 66  Resp: 16 17 16 15   Temp: 98 F (36.7 C) 98.2 F (36.8 C) 98.5 F (36.9 C) 98.3 F (36.8 C)  TempSrc: Oral Oral Oral Oral  SpO2: 98% 100% 100% 100%  Weight:      Height:        Intake/Output Summary (Last 24 hours) at 10/18/2022 1348 Last data filed at 10/18/2022 1226 Gross per 24 hour  Intake 597 ml  Output 2100 ml  Net -1503 ml   Filed Weights   10/08/22 1704 10/08/22 2249  Weight: 81.6 kg 80.7 kg    Examination:  General exam: Appears chronically ill-appearing Respiratory system: Clear to auscultation. Respiratory effort normal. Cardiovascular system: S1 & S2 heard, RRR. No JVD, murmurs, rubs, gallops or clicks. No pedal edema. Gastrointestinal system: Abdomen is nondistended, soft and nontender. No organomegaly or masses felt. Normal bowel sounds heard. Central nervous system: Alert and oriented. No focal neurological deficits. Extremities: no edema  Data Reviewed: I have personally reviewed following labs and imaging studies  CBC: Recent Labs  Lab 10/14/22 0549 10/15/22 0527 10/16/22 0728 10/17/22 0541 10/18/22 0524  WBC 6.2 6.8 6.3 6.6 6.3  NEUTROABS 3.6 4.5 3.9 3.8 3.7  HGB 8.1* 8.0* 8.3* 8.2* 8.2*  HCT  24.8* 25.1* 26.0* 25.6* 26.4*  MCV 88.9 91.9 91.2 91.1 94.3  PLT 260 249 211 258 234   Basic Metabolic Panel: Recent Labs  Lab 10/13/22 0542 10/14/22 0549 10/15/22 0527 10/16/22 0728 10/17/22 0541 10/18/22 0524  NA 138 135 136 138  --  140  Frank 3.5 3.5 3.0* 3.6  --  4.5  CL 113* 112* 111 114*  --  118*  CO2 17* 17* 17* 15*  --  17*  GLUCOSE 79 120* 112* 108*  --  101*  BUN 5* <5* 5* 6*  --  <5*  CREATININE 0.90 0.92 0.92 0.94  --  1.01  CALCIUM 7.3* 6.9* 7.3* 7.3*  --  7.4*  MG 1.9 1.8 1.6* 1.8 1.5* 1.9  PHOS 2.4* 2.9 2.3* 1.6* 3.1 2.1*   GFR: Estimated Creatinine Clearance: 57.4 mL/min (by C-G formula based on SCr of 1.01 mg/dL). Liver Function Tests: No results for input(s): "AST", "ALT", "ALKPHOS", "BILITOT", "PROT", "ALBUMIN" in the last 168 hours.  No results for input(s): "LIPASE", "AMYLASE" in the last 168 hours.  No results for input(s): "AMMONIA" in the last 168 hours. Coagulation Profile: No results for input(s): "INR", "PROTIME" in the last 168 hours. Cardiac Enzymes: No results for input(s): "CKTOTAL", "CKMB", "CKMBINDEX", "TROPONINI" in the last 168 hours. BNP (last 3 results) No results for input(s): "PROBNP" in the last 8760 hours. HbA1C: No results for input(s): "HGBA1C" in  the last 72 hours. CBG: No results for input(s): "GLUCAP" in the last 168 hours. Lipid Profile: No results for input(s): "CHOL", "HDL", "LDLCALC", "TRIG", "CHOLHDL", "LDLDIRECT" in the last 72 hours. Thyroid Function Tests: No results for input(s): "TSH", "T4TOTAL", "FREET4", "T3FREE", "THYROIDAB" in the last 72 hours.  Anemia Panel: No results for input(s): "VITAMINB12", "FOLATE", "FERRITIN", "TIBC", "IRON", "RETICCTPCT" in the last 72 hours. Sepsis Labs: No results for input(s): "PROCALCITON", "LATICACIDVEN" in the last 168 hours.  Recent Results (from the past 240 hour(s))  Gastrointestinal Panel by PCR , Stool     Status: None   Collection Time: 10/09/22  2:10 PM   Specimen:  Stool  Result Value Ref Range Status   Campylobacter species NOT DETECTED NOT DETECTED Final   Plesimonas shigelloides NOT DETECTED NOT DETECTED Final   Salmonella species NOT DETECTED NOT DETECTED Final   Yersinia enterocolitica NOT DETECTED NOT DETECTED Final   Vibrio species NOT DETECTED NOT DETECTED Final   Vibrio cholerae NOT DETECTED NOT DETECTED Final   Enteroaggregative E coli (EAEC) NOT DETECTED NOT DETECTED Final   Enteropathogenic E coli (EPEC) NOT DETECTED NOT DETECTED Final   Enterotoxigenic E coli (ETEC) NOT DETECTED NOT DETECTED Final   Shiga like toxin producing E coli (STEC) NOT DETECTED NOT DETECTED Final   Shigella/Enteroinvasive E coli (EIEC) NOT DETECTED NOT DETECTED Final   Cryptosporidium NOT DETECTED NOT DETECTED Final   Cyclospora cayetanensis NOT DETECTED NOT DETECTED Final   Entamoeba histolytica NOT DETECTED NOT DETECTED Final   Giardia lamblia NOT DETECTED NOT DETECTED Final   Adenovirus F40/41 NOT DETECTED NOT DETECTED Final   Astrovirus NOT DETECTED NOT DETECTED Final   Norovirus GI/GII NOT DETECTED NOT DETECTED Final   Rotavirus A NOT DETECTED NOT DETECTED Final   Sapovirus (I, II, IV, and V) NOT DETECTED NOT DETECTED Final    Comment: Performed at [facility], [address] [place], [place] [address]  C Difficile Quick Screen w PCR reflex     Status: None   Collection Time: 10/09/22  2:10 PM   Specimen: STOOL  Result Value Ref Range Status   C Diff antigen NEGATIVE NEGATIVE Final   C Diff toxin NEGATIVE NEGATIVE Final   C Diff interpretation No C. difficile detected.  Final    Comment: Performed at [facility], [address], [place], [place] [address]  Calprotectin, Fecal     Status: None   Collection Time: 10/10/22  5:15 PM   Specimen: Stool  Result Value Ref Range Status   Calprotectin, Fecal 12 0 - 120 ug/g Final    Comment: (NOTE) Concentration     Interpretation   Follow-Up < 5 - 50 ug/g     Normal            None >50 -120 ug/g     Borderline       Re-evaluate in 4-6 weeks    >120 ug/g     Abnormal         Repeat as clinically                                   indicated Performed At: [facility] [address] [place], [place] [id number] Jolene Schimke MD MW:[id number]      Radiology Studies: No results found.   Scheduled Meds:  apixaban  5 mg Oral BID   cholestyramine light  4 g Oral TID  ferrous sulfate  325 mg Oral BID WC   lipase/protease/amylase  36,000 Units Oral TID AC   loperamide  2 mg Oral TID   mirtazapine  7.5 mg Oral QHS   octreotide  100 mcg Subcutaneous TID   psyllium  1 packet Oral Daily   Continuous Infusions:   LOS: 9 days   Time spent: 39 min Alwyn Ren, MD  10/18/2022, 1:48 PM

## 2022-10-19 DIAGNOSIS — Z7901 Long term (current) use of anticoagulants: Secondary | ICD-10-CM | POA: Diagnosis not present

## 2022-10-19 DIAGNOSIS — C9000 Multiple myeloma not having achieved remission: Secondary | ICD-10-CM | POA: Diagnosis not present

## 2022-10-19 DIAGNOSIS — I2699 Other pulmonary embolism without acute cor pulmonale: Secondary | ICD-10-CM | POA: Diagnosis not present

## 2022-10-19 DIAGNOSIS — E876 Hypokalemia: Secondary | ICD-10-CM | POA: Diagnosis not present

## 2022-10-19 DIAGNOSIS — K529 Noninfective gastroenteritis and colitis, unspecified: Secondary | ICD-10-CM | POA: Diagnosis not present

## 2022-10-19 DIAGNOSIS — K6389 Other specified diseases of intestine: Secondary | ICD-10-CM

## 2022-10-19 LAB — CBC WITH DIFFERENTIAL/PLATELET
Abs Immature Granulocytes: 0.01 10*3/uL (ref 0.00–0.07)
Basophils Absolute: 0 10*3/uL (ref 0.0–0.1)
Basophils Relative: 1 %
Eosinophils Absolute: 0.5 10*3/uL (ref 0.0–0.5)
Eosinophils Relative: 9 %
HCT: 26.3 % — ABNORMAL LOW (ref 39.0–52.0)
Hemoglobin: 8.3 g/dL — ABNORMAL LOW (ref 13.0–17.0)
Immature Granulocytes: 0 %
Lymphocytes Relative: 20 %
Lymphs Abs: 1.2 10*3/uL (ref 0.7–4.0)
MCH: 29.1 pg (ref 26.0–34.0)
MCHC: 31.6 g/dL (ref 30.0–36.0)
MCV: 92.3 fL (ref 80.0–100.0)
Monocytes Absolute: 0.9 10*3/uL (ref 0.1–1.0)
Monocytes Relative: 15 %
Neutro Abs: 3.2 10*3/uL (ref 1.7–7.7)
Neutrophils Relative %: 55 %
Platelets: 261 10*3/uL (ref 150–400)
RBC: 2.85 MIL/uL — ABNORMAL LOW (ref 4.22–5.81)
RDW: 16.3 % — ABNORMAL HIGH (ref 11.5–15.5)
WBC: 5.9 10*3/uL (ref 4.0–10.5)
nRBC: 0 % (ref 0.0–0.2)

## 2022-10-19 LAB — 5 HIAA, QUANTITATIVE, URINE, 24 HOUR
5-HIAA, Ur: 2.2 mg/L
5-HIAA,Quant.,24 Hr Urine: 3.9 mg/(24.h) (ref 0.0–14.9)
Total Volume: 1790

## 2022-10-19 LAB — PHOSPHORUS: Phosphorus: 1.9 mg/dL — ABNORMAL LOW (ref 2.5–4.6)

## 2022-10-19 LAB — MAGNESIUM: Magnesium: 1.6 mg/dL — ABNORMAL LOW (ref 1.7–2.4)

## 2022-10-19 MED ORDER — FLEET ENEMA RE ENEM: 1.0000 | ENEMA | Freq: Once | RECTAL | Status: DC

## 2022-10-19 MED ORDER — APIXABAN 5 MG PO TABS: 5.0000 mg | ORAL_TABLET | Freq: Two times a day (BID) | ORAL | Status: DC

## 2022-10-19 MED ORDER — K PHOS MONO-SOD PHOS DI & MONO 155-852-130 MG PO TABS
500.0000 mg | ORAL_TABLET | Freq: Three times a day (TID) | ORAL | Status: AC
  Administered 2022-10-19 – 2022-10-21 (×5): 500 mg via ORAL
  Filled 2022-10-19 (×6): qty 2

## 2022-10-19 MED ORDER — MAGNESIUM SULFATE 4 GM/100ML IV SOLN
4.0000 g | Freq: Once | INTRAVENOUS | Status: AC
  Administered 2022-10-19: 4 g via INTRAVENOUS
  Filled 2022-10-19: qty 100

## 2022-10-19 NOTE — Progress Notes (Signed)
Physical Therapy Treatment Patient Details Name: Frank Hernandez MRN: [id number] DOB: [birth year] Today's Date: 10/19/2022   History of Present Illness 82 year old with history of multiple myeloma, multiple bony metastatic lesion, normocytic anemia, CKD stage IIIa, DVT and PE on Eliquis presents from cancer center with severe ongoing hypokalemia on 10/08/22.  Patient does have history of intractable diarrhea, recently admitted for hyperkalemia and AKI in the context of chronic diarrhea.    PT Comments  Pt requiring min A today but did take a couple steps to the chair.  He required encouragement for OOB activity and cues for safety.  Pt with pallitative care involved. His plan is to return home with HHPT - Will continue PT to progress and maintain mobility for function and decreased caregiver burden at home.    If plan is discharge home, recommend the following: A little help with walking and/or transfers;A little help with bathing/dressing/bathroom;Direct supervision/assist for medications management;Assistance with cooking/housework;Assist for transportation;Help with stairs or ramp for entrance   Can travel by private vehicle     No  Equipment Recommendations  None recommended by PT    Recommendations for Other Services       Precautions / Restrictions Precautions Precautions: Fall     Mobility  Bed Mobility Overal bed mobility: Needs Assistance Bed Mobility: Supine to Sit     Supine to sit: Min assist, Used rails     General bed mobility comments: Increased tiem with min A for trunk    Transfers Overall transfer level: Needs assistance Equipment used: Rolling walker (2 wheels) Transfers: Sit to/from Stand, Bed to chair/wheelchair/BSC Sit to Stand: Min assist Stand pivot transfers: Min assist         General transfer comment: Pt requiring cues for hand placement and light min A to rise - performed x 3; Step pivot to chair with encouragement and assist for RW.   Positioned 2 pillows side by side in chair, creating a gap for the rectal tube.  Pt reports comfortable    Ambulation/Gait Ambulation/Gait assistance: Min assist Gait Distance (Feet): 2 Feet Assistive device: Rolling walker (2 wheels) Gait Pattern/deviations: Step-to pattern, Wide base of support, Trunk flexed, Knee flexed in stance - left, Knee flexed in stance - right Gait velocity: decreased     General Gait Details: Only able to tolerate steps to chair, fatigued easily, bil knees flexed and with genu varus (noted tight hamstrings in sitting).   Stairs             Wheelchair Mobility     Tilt Bed    Modified Rankin (Stroke Patients Only)       Balance Overall balance assessment: Needs assistance Sitting-balance support: No upper extremity supported, Feet supported Sitting balance-Leahy Scale: Good     Standing balance support: Bilateral upper extremity supported, Reliant on assistive device for balance Standing balance-Leahy Scale: Poor Standing balance comment: RW and CGA to Min A                            Cognition Arousal: Alert Behavior During Therapy: Flat affect Overall Cognitive Status: Impaired/Different from baseline Area of Impairment: Orientation, Attention, Memory, Following commands, Safety/judgement, Awareness, Problem solving                 Orientation Level: Disoriented to, Place, Situation Current Attention Level: Sustained Memory: Decreased short-term memory Following Commands: Follows one step commands with increased time Safety/Judgement: Decreased awareness of deficits Awareness: Emergent Problem  Solving: Slow processing, Requires verbal cues, Requires tactile cues General Comments: Pt speaking in 1-3 word phrases.  He says incorrect answer at times and some perserveration.  Pt would say "no" to wanting a blanket but meaning "yes" as he reaches for it.  Wife reports this is common lately        Exercises General  Exercises - Lower Extremity Ankle Circles/Pumps: AROM, Both, 15 reps Long Arc Quad: AROM, Both, Seated, 10 reps    General Comments General comments (skin integrity, edema, etc.): VSS      Pertinent Vitals/Pain Pain Assessment Pain Assessment: Faces Faces Pain Scale: Hurts a little bit Pain Location: right foot and rectal tube Pain Descriptors / Indicators: Sore Pain Intervention(s): Monitored during session, Limited activity within patient's tolerance, Repositioned    Home Living                          Prior Function            PT Goals (current goals can now be found in the care plan section) Progress towards PT goals: Progressing toward goals    Frequency    Min 1X/week      PT Plan      Co-evaluation              AM-PAC PT "6 Clicks" Mobility   Outcome Measure  Help needed turning from your back to your side while in a flat bed without using bedrails?: A Little Help needed moving from lying on your back to sitting on the side of a flat bed without using bedrails?: A Little Help needed moving to and from a bed to a chair (including a wheelchair)?: A Little Help needed standing up from a chair using your arms (e.g., wheelchair or bedside chair)?: A Little Help needed to walk in hospital room?: Total Help needed climbing 3-5 steps with a railing? : Total 6 Click Score: 14    End of Session Equipment Utilized During Treatment: Gait belt Activity Tolerance: Patient limited by fatigue Patient left: in chair;with call bell/phone within reach;with chair alarm set;with family/visitor present Nurse Communication: Mobility status PT Visit Diagnosis: Difficulty in walking, not elsewhere classified (R26.2);Muscle weakness (generalized) (M62.81)     Time: 1610-9604 PT Time Calculation (min) (ACUTE ONLY): 14 min  Charges:    $Therapeutic Activity: 8-22 mins PT General Charges $$ ACUTE PT VISIT: 1 Visit                     Frank Hernandez, PT Acute  Rehab [facility] [phone]    Frank Hernandez 10/19/2022, 2:54 PM

## 2022-10-19 NOTE — Progress Notes (Signed)
PROGRESS NOTE    LJ SLIMAN  UJW:[id number] DOB: 30-Jul-1940 DOA: 10/08/2022 PCP: [name] (Inactive)   Brief Narrative: 82 year old with history of multiple myeloma, multiple bony metastatic lesion, normocytic anemia, CKD stage IIIa, DVT and PE on Eliquis presents from cancer center with severe ongoing hypokalemia.  Patient does have history of intractable diarrhea, recently admitted for hyperkalemia and AKI in the context of chronic diarrhea. Patient was recently diagnosed with enteropathogenic E. coli and he completed azithromycin therapy X 3 weeks, but continues to have diarrhea.  Flexible sigmoidoscopy on 6/10 with no definitive pathology. In the ED, noted multiple electrolyte imbalance.  Patient admitted for further management.   Assessment & Plan:   Principal Problem:   Hypokalemia Active Problems:   Diarrhea   Essential hypertension   Normocytic anemia   Multiple myeloma not having achieved remission (HCC)   Acute renal failure superimposed on stage 3a chronic kidney disease (HCC)   History of pulmonary embolism   Palliative care encounter   Need for emotional support   Colon distention   Chronic intractable diarrhea-unknown etiology.  Being treated with multiple agents.  Initial thought was this was related to PPI so PPI was stopped, immunotherapy related? Creon started due to low pancreatic fecal elastase.  Lomotil 3 times daily, Metamucil, cholestyramine, Creon,. Recent enteropathogenic E. coli s/p azithromycin Suspicion for amyloidosis of loose bowels but the [place] red stain was negative rectal biopsy.C. difficile, GI pathogen panel all negative Fecal calprotectin negative  GI following. KUB 10/13/2022-significant gaseous distention of the colon Cholestyramine started on 10/13/2022  Octreotide  started 10/13/2022 VIP/celiac/5-HIAA pending GI planning for flex sig and EGD 10/20/2022 n.p.o. after midnight holding Eliquis  Multiple electrolyte  imbalance Hypokalemia, hypomagnesemia, hypophosphatemia Continue aggressive repletion as needed    History of PE-holding Eliquis for  EGD biopsy and flex sig tomorrow   Normocytic anemia Iron deficiency anemia Baseline hemoglobin around 9 Continue oral iron supplementation   AKI on CKD stage IIIa resolved   History of multiple myeloma Metastatic bony lesion Follows with Dr. Candise Che On immunotherapy   Goals of care discussion Palliative consulted, patient full code Family would like to speak with oncology regarding treatment plan versus hospice    Pressure Injury 03/29/22 Buttocks Right Stage 1 -  Intact skin with non-blanchable redness of a localized area usually over a bony prominence. (Active)  03/29/22 2140  Location: Buttocks  Location Orientation: Right  Staging: Stage 1 -  Intact skin with non-blanchable redness of a localized area usually over a bony prominence.  Wound Description (Comments):   Present on Admission: Yes     Pressure Injury 05/28/22 Heel Right Unstageable - Full thickness tissue loss in which the base of the injury is covered by slough (yellow, tan, gray, green or brown) and/or eschar (tan, brown or black) in the wound bed. Open wound, broken from pressure & (Active)  05/28/22 1400  Location: Heel  Location Orientation: Right  Staging: Unstageable - Full thickness tissue loss in which the base of the injury is covered by slough (yellow, tan, gray, green or brown) and/or eschar (tan, brown or black) in the wound bed.  Wound Description (Comments): Open wound, broken from pressure & dry skin  Present on Admission: Yes     Pressure Injury 08/08/22 Buttocks Right;Left Deep Tissue Pressure Injury - Purple or maroon localized area of discolored intact skin or blood-filled blister due to damage of underlying soft tissue from pressure and/or shear. (Active)  08/08/22 1241  Location: Buttocks  Location Orientation: Right;Left  Staging: Deep Tissue Pressure Injury  - Purple or maroon localized area of discolored intact skin or blood-filled blister due to damage of underlying soft tissue from pressure and/or shear.  Wound Description (Comments):   Present on Admission:     Estimated body mass index is 26.27 kg/m as calculated from the following:   Height as of this encounter: 5\' 9"  (1.753 m).   Weight as of this encounter: 80.7 kg.  DVT prophylaxis: Eliquis Code Status: Full Family Communication: None  disposition Plan:  Status is: Inpatient Remains inpatient appropriate because: Electrolite abnormalities and diarrhea   Consultants:  GI  Procedures: Rec tube  antimicrobials: None  Subjective:  Rectal tube in place liquid stool noted less in amount Objective: Flexi-Seal in place with dark brown stool Vitals:   10/18/22 1412 10/18/22 2017 10/19/22 0509 10/19/22 1322  BP: (!) 154/83 (!) 157/84 (!) 148/76 138/84  Pulse: 69 67 68 63  Resp: 17 18 18 20   Temp: 98.1 F (36.7 C) 98.3 F (36.8 C) 98.5 F (36.9 C) 98.4 F (36.9 C)  TempSrc: Oral Oral Oral Oral  SpO2: 100% 100% 100% 99%  Weight:      Height:        Intake/Output Summary (Last 24 hours) at 10/19/2022 1621 Last data filed at 10/19/2022 1425 Gross per 24 hour  Intake 240 ml  Output 2100 ml  Net -1860 ml   Filed Weights   10/08/22 1704 10/08/22 2249  Weight: 81.6 kg 80.7 kg    Examination:  General exam: Appears chronically ill-appearing Respiratory system: Clear to auscultation. Respiratory effort normal. Cardiovascular system: Regular Gastrointestinal system: Abdomen is nondistended, soft and nontender. No organomegaly or masses felt. Normal bowel sounds heard. Central nervous system: Alert and oriented. No focal neurological deficits. Extremities: no edema  Data Reviewed: I have personally reviewed following labs and imaging studies  CBC: Recent Labs  Lab 10/15/22 0527 10/16/22 0728 10/17/22 0541 10/18/22 0524 10/19/22 0605  WBC 6.8 6.3 6.6 6.3 5.9   NEUTROABS 4.5 3.9 3.8 3.7 3.2  HGB 8.0* 8.3* 8.2* 8.2* 8.3*  HCT 25.1* 26.0* 25.6* 26.4* 26.3*  MCV 91.9 91.2 91.1 94.3 92.3  PLT 249 211 258 234 261   Basic Metabolic Panel: Recent Labs  Lab 10/13/22 0542 10/14/22 0549 10/15/22 0527 10/16/22 0728 10/17/22 0541 10/18/22 0524 10/19/22 0605  NA 138 135 136 138  --  140  --   K 3.5 3.5 3.0* 3.6  --  4.5  --   CL 113* 112* 111 114*  --  118*  --   CO2 17* 17* 17* 15*  --  17*  --   GLUCOSE 79 120* 112* 108*  --  101*  --   BUN 5* <5* 5* 6*  --  <5*  --   CREATININE 0.90 0.92 0.92 0.94  --  1.01  --   CALCIUM 7.3* 6.9* 7.3* 7.3*  --  7.4*  --   MG 1.9 1.8 1.6* 1.8 1.5* 1.9 1.6*  PHOS 2.4* 2.9 2.3* 1.6* 3.1 2.1* 1.9*   GFR: Estimated Creatinine Clearance: 57.4 mL/min (by C-G formula based on SCr of 1.01 mg/dL). Liver Function Tests: No results for input(s): "AST", "ALT", "ALKPHOS", "BILITOT", "PROT", "ALBUMIN" in the last 168 hours.  No results for input(s): "LIPASE", "AMYLASE" in the last 168 hours.  No results for input(s): "AMMONIA" in the last 168 hours. Coagulation Profile: No results for input(s): "INR", "PROTIME" in the last 168 hours. Cardiac Enzymes:  No results for input(s): "CKTOTAL", "CKMB", "CKMBINDEX", "TROPONINI" in the last 168 hours. BNP (last 3 results) No results for input(s): "PROBNP" in the last 8760 hours. HbA1C: No results for input(s): "HGBA1C" in the last 72 hours. CBG: No results for input(s): "GLUCAP" in the last 168 hours. Lipid Profile: No results for input(s): "CHOL", "HDL", "LDLCALC", "TRIG", "CHOLHDL", "LDLDIRECT" in the last 72 hours. Thyroid Function Tests: No results for input(s): "TSH", "T4TOTAL", "FREET4", "T3FREE", "THYROIDAB" in the last 72 hours.  Anemia Panel: No results for input(s): "VITAMINB12", "FOLATE", "FERRITIN", "TIBC", "IRON", "RETICCTPCT" in the last 72 hours. Sepsis Labs: No results for input(s): "PROCALCITON", "LATICACIDVEN" in the last 168 hours.  Recent Results  (from the past 240 hour(s))  Calprotectin, Fecal     Status: None   Collection Time: 10/10/22  5:15 PM   Specimen: Stool  Result Value Ref Range Status   Calprotectin, Fecal 12 0 - 120 ug/g Final    Comment: (NOTE) Concentration     Interpretation   Follow-Up < 5 - 50 ug/g     Normal           None >50 -120 ug/g     Borderline       Re-evaluate in 4-6 weeks    >120 ug/g     Abnormal         Repeat as clinically                                   indicated Performed At: [facility] [address] [place], [place] [id number] Jolene Schimke MD UV:[id number]      Radiology Studies: No results found.   Scheduled Meds:  cholestyramine light  4 g Oral TID   ferrous sulfate  325 mg Oral BID WC   lipase/protease/amylase  36,000 Units Oral TID AC   loperamide  2 mg Oral TID   mirtazapine  7.5 mg Oral QHS   octreotide  100 mcg Subcutaneous TID   psyllium  1 packet Oral Daily   [START ON 10/20/2022] sodium phosphate  1 enema Rectal Once   Continuous Infusions:   LOS: 10 days   Time spent: 39 min Alwyn Ren, MD  10/19/2022, 4:21 PM

## 2022-10-19 NOTE — H&P (View-Only) (Signed)
Progress Note   LOS: 10 days   Chief Complaint: Unexplained diarrhea   Subjective   Daily output 200 mL today much improved from previous days.  Patient denies any complaints.  States he does not want to eat because he feels the hospital food does not taste good.  Tolerating other food without difficulty.  Thicker stool noted in Flexi-Seal  Patient states he is agreeable for procedures if indicated.   Objective   Vital signs in last 24 hours: Temp:  [98.1 F (36.7 C)-98.5 F (36.9 C)] 98.5 F (36.9 C) (08/20 0509) Pulse Rate:  [67-69] 68 (08/20 0509) Resp:  [17-18] 18 (08/20 0509) BP: (148-157)/(76-84) 148/76 (08/20 0509) SpO2:  [100 %] 100 % (08/20 0509) Last BM Date : 10/18/22 Last BM recorded by nurses in past 5 days Stool Type: Type 7 (Liquid consistency with no solid pieces) (10/18/2022  3:44 PM)  General:   male in no acute distress Heart:  Regular rate and rhythm; no murmurs Pulm: Clear anteriorly; no wheezing Abdomen: soft, nondistended, normal bowel sounds in all quadrants. Nontender without guarding. No organomegaly appreciated. Extremities:  No edema Neurologic:  Alert and  oriented x4;  No focal deficits.  Psych:  Cooperative. Normal mood and affect.  Intake/Output from previous day: 08/19 0701 - 08/20 0700 In: 1788 [P.O.:1788] Out: 1700 [Urine:1500; Stool:200] Intake/Output this shift: No intake/output data recorded.  Studies/Results: No results found.  Lab Results: Recent Labs    10/17/22 0541 10/18/22 0524 10/19/22 0605  WBC 6.6 6.3 5.9  HGB 8.2* 8.2* 8.3*  HCT 25.6* 26.4* 26.3*  PLT 258 234 261   BMET Recent Labs    10/18/22 0524  NA 140  K 4.5  CL 118*  CO2 17*  GLUCOSE 101*  BUN <5*  CREATININE 1.01  CALCIUM 7.4*   LFT No results for input(s): "PROT", "ALBUMIN", "AST", "ALT", "ALKPHOS", "BILITOT", "BILIDIR", "IBILI" in the last 72 hours. PT/INR No results for input(s): "LABPROT", "INR" in the last 72 hours.   Scheduled  Meds:  apixaban  5 mg Oral BID   cholestyramine light  4 g Oral TID   ferrous sulfate  325 mg Oral BID WC   lipase/protease/amylase  36,000 Units Oral TID AC   loperamide  2 mg Oral TID   mirtazapine  7.5 mg Oral QHS   octreotide  100 mcg Subcutaneous TID   psyllium  1 packet Oral Daily   Continuous Infusions:    Patient profile:   82 y.o. male with a past medical history of a pathological fracture of the right femur secondary to multiple myeloma with AL amyloidosis initially diagnosed 03/2022, CKD stage III, PE/DVT on 07/2022 on Eliquis. Admitted to the hospital with hypokalemia and chronic diarrhea. No abdominal pain. GI pathogen panel 10/09/2022 was negative. C. Diff antigen and toxin negative.  Flexible sigmoidoscopy 08/09/2022 was unrevealing, no evidence of colitis or IBD. CTAP 07/29/2022 showed mild global pancreatic atrophy.     Impression:   Chronic diarrhea -Negative GI pathogen panel, C. difficile.  Previous EPEC 07/2022 treated with azithromycin x 3 days - Negative flex sig with biopsy - Normal ESR/CRP, TSH, celiac.  Negative fecal calprotectin.  Low fecal elastase. 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  Yesterday was 500 mL, today it is 200 mL.  Stool is  also thicker in the Flexi-Seal noted on physical exam.  Still unclear etiology of diarrhea, though working diagnosis could be amyloidosis of the GI tract versus autonomic neuropathy.  But his improvement over the last few days is reassuring.   Multiple myeloma with AL amyloidosis   PE/DVT on Eliquis  Chronic colonic dilation   Plan:   -Continue to follow results of S.VIP and stool studies for microsporidia - Follow electrolytes and correct as needed - Continue octreotide/Creon/Imodium 3 times  daily/cholestyramine 3 times daily - Possibly plan for EGD and flexible sigmoidoscopy tomorrow(?) - N.p.o. midnight - Enema prep versus unprepped - I thoroughly discussed the procedure with the patient (at bedside) to include nature of the procedure, alternatives, benefits, and risks (including but not limited to bleeding, infection, perforation, anesthesia/cardiac pulmonary complications).  [name].   Bayley Leanna Sato  10/19/2022, 10:25 AM     [facility] Attending   I have taken an interval history, reviewed the chart and examined the patient. I agree with the Advanced Practitioner's note, impression and recommendations with the following additions:  Though stool output is decreased I think prudent to do duodenal and repeat rectal biopsies to look for amyloidosis given what he has been through. I stopped Eliquis for now - will restart after procedures.  Iva Boop, MD, [facility]  Gastroenterology See Loretha Stapler on call - gastroenterology for best contact person 10/19/2022 3:58 PM

## 2022-10-19 NOTE — Plan of Care (Signed)
  Problem: Education: Goal: Knowledge of General Education information will improve Description Including pain rating scale, medication(s)/side effects and non-pharmacologic comfort measures Outcome: Progressing   Problem: Clinical Measurements: Goal: Ability to maintain clinical measurements within normal limits will improve Outcome: Progressing   Problem: Activity: Goal: Risk for activity intolerance will decrease Outcome: Progressing   

## 2022-10-19 NOTE — Progress Notes (Addendum)
Progress Note   LOS: 10 days   Chief Complaint: Unexplained diarrhea   Subjective   Daily output 200 mL today much improved from previous days.  Patient denies any complaints.  States he does not want to eat because he feels the hospital food does not taste good.  Tolerating other food without difficulty.  Thicker stool noted in Flexi-Seal  Patient states he is agreeable for procedures if indicated.   Objective   Vital signs in last 24 hours: Temp:  [98.1 F (36.7 C)-98.5 F (36.9 C)] 98.5 F (36.9 C) (08/20 0509) Pulse Rate:  [67-69] 68 (08/20 0509) Resp:  [17-18] 18 (08/20 0509) BP: (148-157)/(76-84) 148/76 (08/20 0509) SpO2:  [100 %] 100 % (08/20 0509) Last BM Date : 10/18/22 Last BM recorded by nurses in past 5 days Stool Type: Type 7 (Liquid consistency with no solid pieces) (10/18/2022  3:44 PM)  General:   male in no acute distress Heart:  Regular rate and rhythm; no murmurs Pulm: Clear anteriorly; no wheezing Abdomen: soft, nondistended, normal bowel sounds in all quadrants. Nontender without guarding. No organomegaly appreciated. Extremities:  No edema Neurologic:  Alert and  oriented x4;  No focal deficits.  Psych:  Cooperative. Normal mood and affect.  Intake/Output from previous day: 08/19 0701 - 08/20 0700 In: 1788 [P.O.:1788] Out: 1700 [Urine:1500; Stool:200] Intake/Output this shift: No intake/output data recorded.  Studies/Results: No results found.  Lab Results: Recent Labs    10/17/22 0541 10/18/22 0524 10/19/22 0605  WBC 6.6 6.3 5.9  HGB 8.2* 8.2* 8.3*  HCT 25.6* 26.4* 26.3*  PLT 258 234 261   BMET Recent Labs    10/18/22 0524  NA 140  K 4.5  CL 118*  CO2 17*  GLUCOSE 101*  BUN <5*  CREATININE 1.01  CALCIUM 7.4*   LFT No results for input(s): "PROT", "ALBUMIN", "AST", "ALT", "ALKPHOS", "BILITOT", "BILIDIR", "IBILI" in the last 72 hours. PT/INR No results for input(s): "LABPROT", "INR" in the last 72 hours.   Scheduled  Meds:  apixaban  5 mg Oral BID   cholestyramine light  4 g Oral TID   ferrous sulfate  325 mg Oral BID WC   lipase/protease/amylase  36,000 Units Oral TID AC   loperamide  2 mg Oral TID   mirtazapine  7.5 mg Oral QHS   octreotide  100 mcg Subcutaneous TID   psyllium  1 packet Oral Daily   Continuous Infusions:    Patient profile:   82 y.o. male with a past medical history of a pathological fracture of the right femur secondary to multiple myeloma with AL amyloidosis initially diagnosed 03/2022, CKD stage III, PE/DVT on 07/2022 on Eliquis. Admitted to the hospital with hypokalemia and chronic diarrhea. No abdominal pain. GI pathogen panel 10/09/2022 was negative. C. Diff antigen and toxin negative.  Flexible sigmoidoscopy 08/09/2022 was unrevealing, no evidence of colitis or IBD. CTAP 07/29/2022 showed mild global pancreatic atrophy.     Impression:   Chronic diarrhea -Negative GI pathogen panel, C. difficile.  Previous EPEC 07/2022 treated with azithromycin x 3 days - Negative flex sig with biopsy - Normal ESR/CRP, TSH, celiac.  Negative fecal calprotectin.  Low fecal elastase. 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  Yesterday was 500 mL, today it is 200 mL.  Stool is  also thicker in the Flexi-Seal noted on physical exam.  Still unclear etiology of diarrhea, though working diagnosis could be amyloidosis of the GI tract versus autonomic neuropathy.  But his improvement over the last few days is reassuring.   Multiple myeloma with AL amyloidosis   PE/DVT on Eliquis  Chronic colonic dilation   Plan:   -Continue to follow results of S.VIP and stool studies for microsporidia - Follow electrolytes and correct as needed - Continue octreotide/Creon/Imodium 3 times  daily/cholestyramine 3 times daily - Possibly plan for EGD and flexible sigmoidoscopy tomorrow(?) - N.p.o. midnight - Enema prep versus unprepped - I thoroughly discussed the procedure with the patient (at bedside) to include nature of the procedure, alternatives, benefits, and risks (including but not limited to bleeding, infection, perforation, anesthesia/cardiac pulmonary complications).  [name].   Frank Hernandez  10/19/2022, 10:25 AM     [facility] Attending   I have taken an interval history, reviewed the chart and examined the patient. I agree with the Advanced Practitioner's note, impression and recommendations with the following additions:  Though stool output is decreased I think prudent to do duodenal and repeat rectal biopsies to look for amyloidosis given what he has been through. I stopped Eliquis for now - will restart after procedures.  Frank Boop, MD, [facility]  Gastroenterology See Loretha Stapler on call - gastroenterology for best contact person 10/19/2022 3:58 PM

## 2022-10-20 ENCOUNTER — Encounter (HOSPITAL_COMMUNITY): Payer: Self-pay | Admitting: Internal Medicine

## 2022-10-20 ENCOUNTER — Inpatient Hospital Stay (HOSPITAL_COMMUNITY): Payer: Medicare PPO | Admitting: Anesthesiology

## 2022-10-20 ENCOUNTER — Encounter (HOSPITAL_COMMUNITY)
Admission: EM | Disposition: A | Discharge status: Home Health | Payer: Self-pay | Source: Ambulatory Visit | Attending: Internal Medicine

## 2022-10-20 DIAGNOSIS — I129 Hypertensive chronic kidney disease with stage 1 through stage 4 chronic kidney disease, or unspecified chronic kidney disease: Secondary | ICD-10-CM

## 2022-10-20 DIAGNOSIS — K529 Noninfective gastroenteritis and colitis, unspecified: Secondary | ICD-10-CM

## 2022-10-20 DIAGNOSIS — N1831 Chronic kidney disease, stage 3a: Secondary | ICD-10-CM | POA: Diagnosis not present

## 2022-10-20 DIAGNOSIS — E876 Hypokalemia: Secondary | ICD-10-CM | POA: Diagnosis not present

## 2022-10-20 DIAGNOSIS — I2609 Other pulmonary embolism with acute cor pulmonale: Secondary | ICD-10-CM | POA: Diagnosis not present

## 2022-10-20 HISTORY — PX: BIOPSY: SHX5522

## 2022-10-20 HISTORY — PX: ESOPHAGOGASTRODUODENOSCOPY: SHX5428

## 2022-10-20 HISTORY — PX: FLEXIBLE SIGMOIDOSCOPY: SHX5431

## 2022-10-20 LAB — CBC WITH DIFFERENTIAL/PLATELET
Abs Immature Granulocytes: 0.01 10*3/uL (ref 0.00–0.07)
Basophils Absolute: 0 10*3/uL (ref 0.0–0.1)
Basophils Relative: 1 %
Eosinophils Absolute: 0.5 10*3/uL (ref 0.0–0.5)
Eosinophils Relative: 9 %
HCT: 25.4 % — ABNORMAL LOW (ref 39.0–52.0)
Hemoglobin: 8.2 g/dL — ABNORMAL LOW (ref 13.0–17.0)
Immature Granulocytes: 0 %
Lymphocytes Relative: 21 %
Lymphs Abs: 1.1 10*3/uL (ref 0.7–4.0)
MCH: 29.2 pg (ref 26.0–34.0)
MCHC: 32.3 g/dL (ref 30.0–36.0)
MCV: 90.4 fL (ref 80.0–100.0)
Monocytes Absolute: 0.7 10*3/uL (ref 0.1–1.0)
Monocytes Relative: 13 %
Neutro Abs: 3 10*3/uL (ref 1.7–7.7)
Neutrophils Relative %: 56 %
Platelets: 218 10*3/uL (ref 150–400)
RBC: 2.81 MIL/uL — ABNORMAL LOW (ref 4.22–5.81)
RDW: 15.9 % — ABNORMAL HIGH (ref 11.5–15.5)
WBC: 5.3 10*3/uL (ref 4.0–10.5)
nRBC: 0 % (ref 0.0–0.2)

## 2022-10-20 LAB — PHOSPHORUS: Phosphorus: 2.9 mg/dL (ref 2.5–4.6)

## 2022-10-20 LAB — COMPREHENSIVE METABOLIC PANEL
ALT: 10 U/L (ref 0–44)
AST: 19 U/L (ref 15–41)
Albumin: 2.5 g/dL — ABNORMAL LOW (ref 3.5–5.0)
Alkaline Phosphatase: 93 U/L (ref 38–126)
Anion gap: 8 (ref 5–15)
BUN: <5 mg/dL — ABNORMAL LOW (ref 8–23)
CO2: 16 mmol/L — ABNORMAL LOW (ref 22–32)
Calcium: 7.4 mg/dL — ABNORMAL LOW (ref 8.9–10.3)
Chloride: 113 mmol/L — ABNORMAL HIGH (ref 98–111)
Creatinine, Ser: 0.83 mg/dL (ref 0.61–1.24)
GFR, Estimated: >60 mL/min (ref >60)
Glucose, Bld: 100 mg/dL — ABNORMAL HIGH (ref 70–99)
Potassium: 4.4 mmol/L (ref 3.5–5.1)
Sodium: 137 mmol/L (ref 135–145)
Total Bilirubin: 0.3 mg/dL (ref 0.3–1.2)
Total Protein: 5 g/dL — ABNORMAL LOW (ref 6.5–8.1)

## 2022-10-20 LAB — MICROSPORIDIA SPORE STAIN, FECES: Microsporidia Spore: NEGATIVE

## 2022-10-20 LAB — MAGNESIUM: Magnesium: 2.1 mg/dL (ref 1.7–2.4)

## 2022-10-20 SURGERY — FLEXIBLE SIGMOIDOSCOPY
Anesthesia: Monitor Anesthesia Care

## 2022-10-20 SURGERY — EGD (ESOPHAGOGASTRODUODENOSCOPY)
Anesthesia: Monitor Anesthesia Care

## 2022-10-20 MED ORDER — PROPOFOL 500 MG/50ML IV EMUL
INTRAVENOUS | Status: DC | PRN
  Administered 2022-10-20: 125 ug/kg/min via INTRAVENOUS

## 2022-10-20 MED ORDER — PROPOFOL 10 MG/ML IV BOLUS
INTRAVENOUS | Status: DC | PRN
  Administered 2022-10-20: 20 mg via INTRAVENOUS

## 2022-10-20 MED ORDER — SODIUM CHLORIDE 0.9 % IV SOLN: INTRAVENOUS | Status: DC

## 2022-10-20 MED ORDER — ONDANSETRON HCL 4 MG/2ML IJ SOLN
INTRAMUSCULAR | Status: DC | PRN
Start: 2022-10-20 — End: 2022-10-20
  Administered 2022-10-20: 4 mg via INTRAVENOUS

## 2022-10-20 MED ORDER — APIXABAN 5 MG PO TABS
5.0000 mg | ORAL_TABLET | Freq: Two times a day (BID) | ORAL | Status: DC
  Administered 2022-10-21 – 2022-10-22 (×3): 5 mg via ORAL
  Filled 2022-10-20 (×3): qty 1

## 2022-10-20 MED ORDER — LACTATED RINGERS IV SOLN: INTRAVENOUS | Status: DC | PRN

## 2022-10-20 MED ORDER — PROPOFOL 1000 MG/100ML IV EMUL
INTRAVENOUS | Status: AC
  Filled 2022-10-20: qty 100

## 2022-10-20 MED ORDER — LIDOCAINE 2% (20 MG/ML) 5 ML SYRINGE
INTRAMUSCULAR | Status: DC | PRN
  Administered 2022-10-20: 50 mg via INTRAVENOUS

## 2022-10-20 NOTE — Progress Notes (Signed)
PROGRESS NOTE    Frank Hernandez  GMW:[id number] DOB: 1940/03/24 DOA: 10/08/2022 PCP: [name] (Inactive)   Brief Narrative:  This 82 year old male with history of multiple myeloma, multiple bony metastatic lesions, normocytic anemia, CKD stage IIIa, DVT and PE on Eliquis presents from cancer center with severe ongoing hypokalemia. Patient does have history of intractable diarrhea, recently admitted for hyperkalemia and AKI in the context of chronic diarrhea. Patient was recently diagnosed with enteropathogenic E. coli and he completed azithromycin therapy X 3 weeks, but continues to have diarrhea. Flexible sigmoidoscopy on 6/10 with no definitive pathology. In the ED, noted to have multiple electrolyte abnormalities. Patient admitted for further management   Assessment & Plan:   Principal Problem:   Hypokalemia Active Problems:   Diarrhea   Essential hypertension   Normocytic anemia   Multiple myeloma not having achieved remission (HCC)   Acute renal failure superimposed on stage 3a chronic kidney disease (HCC)   History of pulmonary embolism   Palliative care encounter   Need for emotional support   Colon distention  Chronic intractable diarrhea, unknown etiology: He is being treated with multiple agents.   Initial thought was it is related to PPI so PPI was stopped, immunotherapy related?  Creon started due to low pancreatic fecal elastase.  Continue Lomotil 3 times daily, Metamucil, cholestyramine, Creon,. Recent enteropathogenic E. coli s/p azithromycin treatment x 3 weeks. Suspicion for amyloidosis but the [place] red stain was negative,  rectal biopsy. C. difficile, GI pathogen panel all negative Fecal calprotectin negative . GI following. KUB 10/13/2022-significant gaseous distention of the colon Cholestyramine started on 10/13/2022  Octreotide  started 10/13/2022 VIP/celiac/5-HIAA pending GI planning for flex sig and EGD 10/20/2022  Eliquis is on hold for  procedure.   Multiple electrolyte abnormalities: Hypokalemia, hypomagnesemia, hypophosphatemia: Continue aggressive repletion as needed.   History of PE: Holding Eliquis for EGD biopsy and flex sig today.   Normocytic anemia Iron deficiency anemia Baseline hemoglobin around 9.0 Continue oral iron supplementation   AKI on CKD stage IIIa  > resolved.   History of multiple myeloma: Metastatic bony lesion: Follows with Dr. Candise Che On immunotherapy   Goals of care discussion: Palliative consulted, patient wants to be full code. Family would like to speak with oncology regarding treatment plan versus hospice.     Pressure Injury 03/29/22 Buttocks Right Stage 1 -  Intact skin with non-blanchable redness of a localized area usually over a bony prominence. (Active)  03/29/22 2140  Location: Buttocks  Location Orientation: Right  Staging: Stage 1 -  Intact skin with non-blanchable redness of a localized area usually over a bony prominence.  Wound Description (Comments):   Present on Admission: Yes     Pressure Injury 05/28/22 Heel Right Unstageable - Full thickness tissue loss in which the base of the injury is covered by slough (yellow, tan, gray, green or brown) and/or eschar (tan, brown or black) in the wound bed. Open wound, broken from pressure & (Active)  05/28/22 1400  Location: Heel  Location Orientation: Right  Staging: Unstageable - Full thickness tissue loss in which the base of the injury is covered by slough (yellow, tan, gray, green or brown) and/or eschar (tan, brown or black) in the wound bed.  Wound Description (Comments): Open wound, broken from pressure & dry skin  Present on Admission: Yes     Pressure Injury 08/08/22 Buttocks Right;Left Deep Tissue Pressure Injury - Purple or maroon localized area of discolored intact skin or blood-filled blister due to  damage of underlying soft tissue from pressure and/or shear. (Active)  08/08/22 1241  Location: Buttocks   Location Orientation: Right;Left  Staging: Deep Tissue Pressure Injury - Purple or maroon localized area of discolored intact skin or blood-filled blister due to damage of underlying soft tissue from pressure and/or shear.  Wound Description (Comments):   Present on Admission:      DVT prophylaxis: Eliquis Code Status: Full code Family Communication: No family at bedside Disposition Plan:  Status is: Inpatient Remains inpatient appropriate because: Not medically ready for discharge, multiple electrolyte abnormalities    Consultants:  Gastroenterology  Procedures: Scheduled flexible sigmoidoscopy and EGD. Rectal Tube  Antimicrobials:  Anti-infectives (From admission, onward)    None      Subjective: Patient was seen and examined at bedside. Overnight events noted.   Patient reports still having diarrhea,  patient has a Flexi-Seal in place with dark-colored stools noted.   He is scheduled for flexible sigmoidoscopy and EGD today.  Objective: Vitals:   10/19/22 1322 10/19/22 2022 10/20/22 0518 10/20/22 1250  BP: 138/84 (!) 153/78 (!) 152/83 (!) 164/61  Pulse: 63 68 70 72  Resp: 20 18 18 18   Temp: 98.4 F (36.9 C) 98.6 F (37 C) 98.6 F (37 C) 98.7 F (37.1 C)  TempSrc: Oral Oral Oral Temporal  SpO2: 99% 100% 100% 99%  Weight:      Height:        Intake/Output Summary (Last 24 hours) at 10/20/2022 1352 Last data filed at 10/20/2022 1100 Gross per 24 hour  Intake 41.93 ml  Output 2500 ml  Net -2458.07 ml   Filed Weights   10/08/22 1704 10/08/22 2249  Weight: 81.6 kg 80.7 kg    Examination:  General exam: Appears calm and comfortable, deconditioned, not in any distress. Respiratory system: Clear to auscultation. Respiratory effort normal. RR 16 Cardiovascular system: S1 & S2 heard, RRR. No JVD, murmurs, rubs, gallops or clicks.  Gastrointestinal system: Abdomen is non distended, soft and non tender. Normal bowel sounds heard. Central nervous system: Alert  and oriented X 3. No focal neurological deficits. Extremities: Symmetric 5 x 5 power. Skin: No rashes, lesions or ulcers Psychiatry: Judgement and insight appear normal. Mood & affect appropriate.     Data Reviewed: I have personally reviewed following labs and imaging studies  CBC: Recent Labs  Lab 10/16/22 0728 10/17/22 0541 10/18/22 0524 10/19/22 0605 10/20/22 0608  WBC 6.3 6.6 6.3 5.9 5.3  NEUTROABS 3.9 3.8 3.7 3.2 3.0  HGB 8.3* 8.2* 8.2* 8.3* 8.2*  HCT 26.0* 25.6* 26.4* 26.3* 25.4*  MCV 91.2 91.1 94.3 92.3 90.4  PLT 211 258 234 261 218   Basic Metabolic Panel: Recent Labs  Lab 10/14/22 0549 10/15/22 0527 10/16/22 0728 10/17/22 0541 10/18/22 0524 10/19/22 0605 10/20/22 0608  NA 135 136 138  --  140  --  137  K 3.5 3.0* 3.6  --  4.5  --  4.4  CL 112* 111 114*  --  118*  --  113*  CO2 17* 17* 15*  --  17*  --  16*  GLUCOSE 120* 112* 108*  --  101*  --  100*  BUN <5* 5* 6*  --  <5*  --  <5*  CREATININE 0.92 0.92 0.94  --  1.01  --  0.83  CALCIUM 6.9* 7.3* 7.3*  --  7.4*  --  7.4*  MG 1.8 1.6* 1.8 1.5* 1.9 1.6* 2.1  PHOS 2.9 2.3* 1.6* 3.1 2.1* 1.9* 2.9  GFR: Estimated Creatinine Clearance: 69.8 mL/min (by C-G formula based on SCr of 0.83 mg/dL). Liver Function Tests: Recent Labs  Lab 10/20/22 0608  AST 19  ALT 10  ALKPHOS 93  BILITOT 0.3  PROT 5.0*  ALBUMIN 2.5*   No results for input(s): "LIPASE", "AMYLASE" in the last 168 hours. No results for input(s): "AMMONIA" in the last 168 hours. Coagulation Profile: No results for input(s): "INR", "PROTIME" in the last 168 hours. Cardiac Enzymes: No results for input(s): "CKTOTAL", "CKMB", "CKMBINDEX", "TROPONINI" in the last 168 hours. BNP (last 3 results) No results for input(s): "PROBNP" in the last 8760 hours. HbA1C: No results for input(s): "HGBA1C" in the last 72 hours. CBG: No results for input(s): "GLUCAP" in the last 168 hours. Lipid Profile: No results for input(s): "CHOL", "HDL", "LDLCALC",  "TRIG", "CHOLHDL", "LDLDIRECT" in the last 72 hours. Thyroid Function Tests: No results for input(s): "TSH", "T4TOTAL", "FREET4", "T3FREE", "THYROIDAB" in the last 72 hours. Anemia Panel: No results for input(s): "VITAMINB12", "FOLATE", "FERRITIN", "TIBC", "IRON", "RETICCTPCT" in the last 72 hours. Sepsis Labs: No results for input(s): "PROCALCITON", "LATICACIDVEN" in the last 168 hours.  Recent Results (from the past 240 hour(s))  Calprotectin, Fecal     Status: None   Collection Time: 10/10/22  5:15 PM   Specimen: Stool  Result Value Ref Range Status   Calprotectin, Fecal 12 0 - 120 ug/g Final    Comment: (NOTE) Concentration     Interpretation   Follow-Up < 5 - 50 ug/g     Normal           None >50 -120 ug/g     Borderline       Re-evaluate in 4-6 weeks    >120 ug/g     Abnormal         Repeat as clinically                                   indicated Performed At: [facility] [address] [place], [place] [id number] Jolene Schimke MD NU:[id number]     Radiology Studies: No results found.  Scheduled Meds:  [MAR Hold] cholestyramine light  4 g Oral TID   [MAR Hold] ferrous sulfate  325 mg Oral BID WC   [MAR Hold] lipase/protease/amylase  36,000 Units Oral TID AC   [MAR Hold] loperamide  2 mg Oral TID   [MAR Hold] mirtazapine  7.5 mg Oral QHS   [MAR Hold] octreotide  100 mcg Subcutaneous TID   [MAR Hold] phosphorus  500 mg Oral TID   [MAR Hold] psyllium  1 packet Oral Daily   [MAR Hold] sodium phosphate  1 enema Rectal Once   Continuous Infusions:  sodium chloride     sodium chloride       LOS: 11 days    Time spent: 50 Mins    Willeen Niece, MD [facility]   If 7PM-7AM, please contact night-coverage

## 2022-10-20 NOTE — Care Management Important Message (Signed)
Important Message  Patient Details IM Letter given. Name: PHILIPS KIMAK MRN: [id number] Date of Birth: 27-Apr-1940   Medicare Important Message Given:  Yes     Caren Macadam 10/20/2022, 9:51 AM

## 2022-10-20 NOTE — Interval H&P Note (Signed)
History and Physical Interval Note:  10/20/2022 1:21 PM  Frank Hernandez  has presented today for surgery, with the diagnosis of chronic diarrhea.  The various methods of treatment have been discussed with the patient and family. After consideration of risks, benefits and other options for treatment, the patient has consented to  Procedure(s): ESOPHAGOGASTRODUODENOSCOPY (EGD) (N/A) FLEXIBLE SIGMOIDOSCOPY (N/A) as a surgical intervention.  The patient's history has been reviewed, patient examined, no change in status, stable for surgery.  I have reviewed the patient's chart and labs.  Questions were answered to the patient's satisfaction.     Stan Head

## 2022-10-20 NOTE — Anesthesia Preprocedure Evaluation (Addendum)
Anesthesia Evaluation  Patient identified by MRN, date of birth, ID band Patient awake    Reviewed: Allergy & Precautions, NPO status , Patient's Chart, lab work & pertinent test results  History of Anesthesia Complications Negative for: history of anesthetic complications  Airway Mallampati: III  TM Distance: >3 FB Neck ROM: Full   Comment: Previous grade I view with MAC 4, mask with OPA Dental  (+) Dental Advisory Given   Pulmonary neg shortness of breath, neg COPD, neg recent URI, PE   Pulmonary exam normal breath sounds clear to auscultation       Cardiovascular hypertension, (-) angina (-) Past MI, (-) Cardiac Stents and (-) CABG + dysrhythmias (prolonged QT) Atrial Fibrillation  Rhythm:Regular Rate:Normal  TTE 07/30/2022: IMPRESSIONS     1. Left ventricular ejection fraction, by estimation, is 65 to 70%. The  left ventricle has normal function. The left ventricle has no regional  wall motion abnormalities. Left ventricular diastolic parameters are  consistent with Grade I diastolic  dysfunction (impaired relaxation).   2. Right ventricular systolic function is normal. The right ventricular  size is normal.   3. The mitral valve is normal in structure. Trivial mitral valve  regurgitation. No evidence of mitral stenosis.   4. The aortic valve is normal in structure. Aortic valve regurgitation is  trivial. No aortic stenosis is present.   5. The inferior vena cava is normal in size with greater than 50%  respiratory variability, suggesting right atrial pressure of 3 mmHg.     Neuro/Psych negative neurological ROS     GI/Hepatic Neg liver ROS,GERD  Medicated,,Ogilvie's syndrome   Endo/Other  Pre-diabetes  Renal/GU CRFRenal disease   Metastatic bladder cancer    Musculoskeletal  (+) Arthritis ,    Abdominal   Peds  Hematology  (+) Blood dyscrasia, anemia Multiple myeloma  Lab Results      Component                 Value               Date                      WBC                      5.3                 10/20/2022                HGB                      8.2 (L)             10/20/2022                HCT                      25.4 (L)            10/20/2022                MCV                      90.4                10/20/2022                PLT  218                 10/20/2022              Anesthesia Other Findings 82 year old with history of multiple myeloma, multiple bony metastatic lesion, normocytic anemia, CKD stage IIIa, DVT and PE on Eliquis presents from cancer center with severe ongoing hypokalemia.  Patient does have history of intractable diarrhea, recently admitted for hyperkalemia and AKI in the context of chronic diarrhea. Patient was recently diagnosed with enteropathogenic E. coli and he completed azithromycin therapy X 3 weeks, but continues to have diarrhea.  Flexible sigmoidoscopy on 6/10 with no definitive pathology. In the ED, noted multiple electrolyte imbalance.  Patient admitted for further management.  K 4.4  Last Eliquis: yesterday  Reproductive/Obstetrics                             Anesthesia Physical Anesthesia Plan  ASA: 3  Anesthesia Plan: MAC   Post-op Pain Management:    Induction: Intravenous  PONV Risk Score and Plan: 1 and Propofol infusion, TIVA and Treatment may vary due to age or medical condition  Airway Management Planned: Natural Airway and Nasal Cannula  Additional Equipment:   Intra-op Plan:   Post-operative Plan:   Informed Consent: I have reviewed the patients History and Physical, chart, labs and discussed the procedure including the risks, benefits and alternatives for the proposed anesthesia with the patient or authorized representative who has indicated his/her understanding and acceptance.       Plan Discussed with: Anesthesiologist and CRNA  Anesthesia Plan Comments: (Discussed with  patient risks of MAC including, but not limited to, minor pain or discomfort, hearing people in the room, and possible need for backup general anesthesia. Risks for general anesthesia also discussed including, but not limited to, sore throat, hoarse voice, chipped/damaged teeth, injury to vocal cords, nausea and vomiting, allergic reactions, lung infection, heart attack, stroke, and death. All questions answered. )        Anesthesia Quick Evaluation

## 2022-10-20 NOTE — Anesthesia Procedure Notes (Signed)
Procedure Name: MAC Date/Time: 10/20/2022 1:23 PM  Performed by: Orest Dikes, CRNAPre-anesthesia Checklist: Patient identified, Emergency Drugs available, Suction available and Patient being monitored Oxygen Delivery Method: Simple face mask

## 2022-10-20 NOTE — Anesthesia Postprocedure Evaluation (Signed)
Anesthesia Post Note  Patient: Frank Hernandez  Procedure(s) Performed: ESOPHAGOGASTRODUODENOSCOPY (EGD) FLEXIBLE SIGMOIDOSCOPY BIOPSY     Patient location during evaluation: PACU Anesthesia Type: MAC Level of consciousness: awake Pain management: pain level controlled Vital Signs Assessment: post-procedure vital signs reviewed and stable Respiratory status: spontaneous breathing, nonlabored ventilation and respiratory function stable Cardiovascular status: stable and blood pressure returned to baseline Postop Assessment: no apparent nausea or vomiting Anesthetic complications: no   No notable events documented.  Last Vitals:  Vitals:   10/20/22 1405 10/20/22 1425  BP: (!) 115/58 (!) 152/76  Pulse: 72 70  Resp: (!) 24 20  Temp:  36.8 C  SpO2: 98% 100%    Last Pain:  Vitals:   10/20/22 1427  TempSrc:   PainSc: 0-No pain                 Linton Rump

## 2022-10-20 NOTE — Transfer of Care (Signed)
Immediate Anesthesia Transfer of Care Note  Patient: Frank Hernandez  Procedure(s) Performed: ESOPHAGOGASTRODUODENOSCOPY (EGD) FLEXIBLE SIGMOIDOSCOPY BIOPSY  Patient Location: PACU and Endoscopy Unit  Anesthesia Type:MAC  Level of Consciousness: drowsy  Airway & Oxygen Therapy: Patient Spontanous Breathing and Patient connected to face mask oxygen  Post-op Assessment: Report given to RN and Post -op Vital signs reviewed and stable  Post vital signs: Reviewed and stable  Last Vitals:  Vitals Value Taken Time  BP    Temp    Pulse    Resp    SpO2      Last Pain:  Vitals:   10/20/22 1250  TempSrc: Temporal  PainSc: 0-No pain      Patients Stated Pain Goal: 0 (10/16/22 1952)  Complications: No notable events documented.

## 2022-10-20 NOTE — Op Note (Signed)
[facility] Patient Name: Frank Hernandez Procedure Date: 10/20/2022 MRN: [id number] Attending MD: Iva Boop , MD, [id number] Date of Birth: 14-Oct-1940 CSN: [id number] Age: 82 Admit Type: Inpatient Procedure:                Flexible Sigmoidoscopy Indications:              Chronic diarrhea r/o amyloidosis Providers:                Iva Boop, MD, Martha Clan, RN,                            Sunday Corn Mbumina, Technician Referring MD:              Medicines:                Monitored Anesthesia Care Complications:            No immediate complications. Estimated Blood Loss:     Estimated blood loss was minimal. Procedure:                Pre-Anesthesia Assessment:                           - Prior to the procedure, a History and Physical                            was performed, and patient medications and                            allergies were reviewed. The patient's tolerance of                            previous anesthesia was also reviewed. The risks                            and benefits of the procedure and the sedation                            options and risks were discussed with the patient.                            All questions were answered, and informed consent                            was obtained. Prior Anticoagulants: The patient                            last took Eliquis (apixaban) 1 day prior to the                            procedure. ASA Grade Assessment: III - A patient                            with severe systemic disease. After reviewing the  risks and benefits, the patient was deemed in                            satisfactory condition to undergo the procedure.                           After obtaining informed consent, the scope was                            passed under direct vision. The [id number]-[id number] ([id number])                            Olympus endoscope was introduced through the anus                             and advanced to the the sigmoid colon. Scope In: 1:44:21 PM Scope Out: 1:47:26 PM Total Procedure Duration: 0 hours 3 minutes 5 seconds  Findings:      The perianal and digital rectal examinations were normal.      A small amount of stool was found in the rectum and in the sigmoid       colon, interfering with visualization. Biopsies were taken with a cold       forceps for histology. Verification of patient identification for the       specimen was done. Estimated blood loss was minimal. Impression:               - Stool (mucoid consistency w/ cholestyramine and                            iron particles visible) in the rectum and in the                            sigmoid colon on this unprepped exam- areas of                            mucsa seen wre normal, Biopsied. r/O amyloidosis Moderate Sedation:      Not Applicable - Patient had care per Anesthesia. Recommendation:           - Return patient to hospital ward for ongoing care.                           - leave rectal tube out and see how it goes                           I will f/u path                           ? home soon                           I did stop octreotide as I do not think it is                            possible to get that paid for  as outpatient.                           Creaon probably overkill also - had abnornal fecal                            elastase but stools were watery do suspect                            dilutuional.                           Given how bad the diarrhea was stopping octreotide                            is only move now.                           resume Eliquis in Am (ordered)                           reg diet again Procedure Code(s):        --- Professional ---                           [phone], Sigmoidoscopy, flexible; with biopsy, single                            or multiple Diagnosis Code(s):        --- Professional ---                           K52.9,  Noninfective gastroenteritis and colitis,                            unspecified CPT copyright 2022 [employer]. All rights reserved. The codes documented in this report are preliminary and upon coder review may  be revised to meet current compliance requirements. Iva Boop, MD 10/20/2022 2:12:34 PM This report has been signed electronically. Number of Addenda: 0

## 2022-10-20 NOTE — Op Note (Signed)
[facility] Patient Name: Frank Hernandez Procedure Date: 10/20/2022 MRN: [id number] Attending MD: Iva Boop , MD, [id number] Date of Birth: 1940/09/13 CSN: [id number] Age: 82 Admit Type: Inpatient Procedure:                Upper GI endoscopy Indications:              Diarrhea ? amyloidosis Providers:                Iva Boop, MD, Martha Clan, RN,                            Sunday Corn Mbumina, Technician Referring MD:              Medicines:                Monitored Anesthesia Care Complications:            No immediate complications. Estimated Blood Loss:     Estimated blood loss was minimal. Procedure:                Pre-Anesthesia Assessment:                           - Prior to the procedure, a History and Physical                            was performed, and patient medications and                            allergies were reviewed. The patient's tolerance of                            previous anesthesia was also reviewed. The risks                            and benefits of the procedure and the sedation                            options and risks were discussed with the patient.                            All questions were answered, and informed consent                            was obtained. Prior Anticoagulants: The patient                            last took Eliquis (apixaban) 1 day prior to the                            procedure. ASA Grade Assessment: III - A patient                            with severe systemic disease. After reviewing the  risks and benefits, the patient was deemed in                            satisfactory condition to undergo the procedure.                           After obtaining informed consent, the endoscope was                            passed under direct vision. Throughout the                            procedure, the patient's blood pressure, pulse, and                             oxygen saturations were monitored continuously. The                            [id number]-H190 ([id number] Olympus endoscope was introduced                            through the mouth, and advanced to the second part                            of duodenum. The upper GI endoscopy was                            accomplished without difficulty. The patient                            tolerated the procedure well. Scope In: Scope Out: Findings:      The esophagus was normal.      The stomach was normal.      The examined duodenum was normal.      The cardia and gastric fundus were normal on retroflexion.      Biopsies were taken with a cold forceps in the second portion of the       duodenum for histology. Verification of patient identification for the       specimen was done. Impression:               - Normal esophagus.                           - Normal stomach. J shaped variant                           - Normal examined duodenum.                           - Biopsies were taken with a cold forceps for                            histology in the second portion of the duodenum.  R/O amyloidosis Moderate Sedation:      Not Applicable - Patient had care per Anesthesia. Recommendation:           - Return patient to hospital ward for ongoing care.                           - See the other procedure note for documentation of                            additional recommendations. Procedure Code(s):        --- Professional ---                           [id number], Esophagogastroduodenoscopy, flexible,                            transoral; with biopsy, single or multiple Diagnosis Code(s):        --- Professional ---                           R19.7, Diarrhea, unspecified CPT copyright 2022 [employer]. All rights reserved. The codes documented in this report are preliminary and upon coder review may  be revised to meet current compliance requirements. Iva Boop, MD 10/20/2022 2:05:29 PM This report has been signed electronically. Number of Addenda: 0

## 2022-10-21 ENCOUNTER — Telehealth: Payer: Self-pay | Admitting: Hematology

## 2022-10-21 DIAGNOSIS — E876 Hypokalemia: Secondary | ICD-10-CM | POA: Diagnosis not present

## 2022-10-21 LAB — CBC
HCT: 26.3 % — ABNORMAL LOW (ref 39.0–52.0)
Hemoglobin: 8.5 g/dL — ABNORMAL LOW (ref 13.0–17.0)
MCH: 29.5 pg (ref 26.0–34.0)
MCHC: 32.3 g/dL (ref 30.0–36.0)
MCV: 91.3 fL (ref 80.0–100.0)
Platelets: 252 10*3/uL (ref 150–400)
RBC: 2.88 MIL/uL — ABNORMAL LOW (ref 4.22–5.81)
RDW: 16.4 % — ABNORMAL HIGH (ref 11.5–15.5)
WBC: 5.8 10*3/uL (ref 4.0–10.5)
nRBC: 0 % (ref 0.0–0.2)

## 2022-10-21 LAB — PHOSPHORUS: Phosphorus: 3.3 mg/dL (ref 2.5–4.6)

## 2022-10-21 LAB — BASIC METABOLIC PANEL
Anion gap: 6 (ref 5–15)
BUN: <5 mg/dL — ABNORMAL LOW (ref 8–23)
CO2: 17 mmol/L — ABNORMAL LOW (ref 22–32)
Calcium: 7.6 mg/dL — ABNORMAL LOW (ref 8.9–10.3)
Chloride: 115 mmol/L — ABNORMAL HIGH (ref 98–111)
Creatinine, Ser: 0.85 mg/dL (ref 0.61–1.24)
GFR, Estimated: >60 mL/min (ref >60)
Glucose, Bld: 90 mg/dL (ref 70–99)
Potassium: 4.8 mmol/L (ref 3.5–5.1)
Sodium: 138 mmol/L (ref 135–145)

## 2022-10-21 LAB — MAGNESIUM: Magnesium: 1.9 mg/dL (ref 1.7–2.4)

## 2022-10-21 LAB — SURGICAL PATHOLOGY

## 2022-10-21 NOTE — Progress Notes (Signed)
Physical Therapy Treatment Patient Details Name: Frank Hernandez MRN: [id number] DOB: Oct 12, 1940 Today's Date: 10/21/2022   History of Present Illness 82 year old with history of multiple myeloma, multiple bony metastatic lesion, normocytic anemia, CKD stage IIIa, DVT and PE on Eliquis presents from cancer center with severe ongoing hypokalemia on 10/08/22.  Patient does have history of intractable diarrhea, recently admitted for hyperkalemia and AKI in the context of chronic diarrhea.    PT Comments  Pt seen for PT tx with wife present for session. Pt reports he does not want to sit in recliner but agreeable to OOB mobility. Pt is able to complete supine>sit with supervision with HOB elevated, use of bed rails. Pt is able to perform STS from EOB x 2 with RW & CGA but unable to attempt gait 2/2 need to toilet. Pt assisted to [facility] via stand pivot & left with call bell in reach, wife present in room, nurse aware. During session pt & wife report pt is planning to return home at d/c & PT educated them on ability to use w/c for mobility & pt to perform stand pivot transfers to various surfaces with both voicing understanding (they report pt walked short distance to bathroom only with w/c follow prior to admission).    If plan is discharge home, recommend the following: A little help with walking and/or transfers;A little help with bathing/dressing/bathroom;Direct supervision/assist for medications management;Assistance with cooking/housework;Assist for transportation;Help with stairs or ramp for entrance   Can travel by private vehicle     No  Equipment Recommendations  None recommended by PT    Recommendations for Other Services       Precautions / Restrictions Precautions Precautions: Fall Restrictions Weight Bearing Restrictions: No     Mobility  Bed Mobility Overal bed mobility: Needs Assistance Bed Mobility: Supine to Sit   Sidelying to sit: Supervision, Used rails             Transfers Overall transfer level: Needs assistance Equipment used: Rolling walker (2 wheels) Transfers: Sit to/from Stand Sit to Stand: Contact guard assist Stand pivot transfers: Contact guard assist         General transfer comment: Pt transfers STS from EOB x 2 with RW & CGA, bed>BSC on L via stand pivot with CGA with cuing for hand placement    Ambulation/Gait                   Stairs             Wheelchair Mobility     Tilt Bed    Modified Rankin (Stroke Patients Only)       Balance Overall balance assessment: Needs assistance Sitting-balance support: No upper extremity supported, Feet supported Sitting balance-Leahy Scale: Good     Standing balance support: Bilateral upper extremity supported, Reliant on assistive device for balance Standing balance-Leahy Scale: Fair                              Cognition Arousal: Alert Behavior During Therapy: Flat affect Overall Cognitive Status: Impaired/Different from baseline                         Following Commands: Follows one step commands with increased time                Exercises      General Comments        Pertinent Vitals/Pain  Pain Assessment Pain Assessment: No/denies pain    Home Living                          Prior Function            PT Goals (current goals can now be found in the care plan section) Acute Rehab PT Goals PT Goal Formulation: With patient Time For Goal Achievement: 10/24/22 Potential to Achieve Goals: Good Progress towards PT goals: Progressing toward goals    Frequency    Min 1X/week      PT Plan      Co-evaluation              AM-PAC PT "6 Clicks" Mobility   Outcome Measure  Help needed turning from your back to your side while in a flat bed without using bedrails?: None Help needed moving from lying on your back to sitting on the side of a flat bed without using bedrails?: A Little Help  needed moving to and from a bed to a chair (including a wheelchair)?: A Little Help needed standing up from a chair using your arms (e.g., wheelchair or bedside chair)?: A Little Help needed to walk in hospital room?: A Little Help needed climbing 3-5 steps with a railing? : A Lot 6 Click Score: 18    End of Session   Activity Tolerance:  (limited by need to toilet) Patient left: with call bell/phone within reach;with family/visitor present (on [facility]) Nurse Communication:  (pt left on BSC with call bell in reach) PT Visit Diagnosis: Muscle weakness (generalized) (M62.81);Difficulty in walking, not elsewhere classified (R26.2);Other abnormalities of gait and mobility (R26.89)     Time: 1610-9604 PT Time Calculation (min) (ACUTE ONLY): 13 min  Charges:    $Therapeutic Activity: 8-22 mins PT General Charges $$ ACUTE PT VISIT: 1 Visit                     Aleda Grana, PT, DPT 10/21/22, 3:24 PM   Sandi Mariscal 10/21/2022, 3:22 PM

## 2022-10-21 NOTE — Progress Notes (Signed)
PROGRESS NOTE    Frank Hernandez  NWG:[id number] DOB: 04-19-1940 DOA: 10/08/2022 PCP: [name] (Inactive)   Brief Narrative:  This 82 year old male with history of multiple myeloma, multiple bony metastatic lesions, normocytic anemia, CKD stage IIIa, DVT and PE on Eliquis presents from cancer center with severe ongoing hypokalemia. Patient does have history of intractable diarrhea, recently admitted for hyperkalemia and AKI in the context of chronic diarrhea. Patient was recently diagnosed with enteropathogenic E. coli and he completed azithromycin therapy X 3 weeks, but continues to have diarrhea. Flexible sigmoidoscopy on 6/10 with no definitive pathology. In the ED, noted to have multiple electrolyte abnormalities. Patient admitted for further management   Assessment & Plan:   Principal Problem:   Hypokalemia Active Problems:   Diarrhea   Essential hypertension   Normocytic anemia   Multiple myeloma not having achieved remission (HCC)   Acute renal failure superimposed on stage 3a chronic kidney disease (HCC)   History of pulmonary embolism   Palliative care encounter   Need for emotional support   Colon distention  Chronic intractable diarrhea, unknown etiology: He is being treated with multiple agents.   Initial thought was it is related to PPI so PPI was stopped, immunotherapy related?  Creon started due to low pancreatic fecal elastase.  Continue Lomotil 3 times daily, Metamucil, cholestyramine, Creon,. Recent enteropathogenic E. coli s/p azithromycin treatment x 3 weeks. Suspicion for amyloidosis but the Congo red stain was negative,  rectal biopsy. C. difficile, GI pathogen panel all negative. Fecal calprotectin negative . GI following. KUB 10/13/2022-significant gaseous distention of the colon Cholestyramine started on 10/13/2022  Octreotide  started 10/13/2022 VIP/celiac/5-HIAA pending Patient underwent flex sigmoidoscopy and EGD on 10/20/2022. EGD was  unremarkable, flexible sigmoidoscopy completed. Octreotide discontinued 10/20/22   Multiple electrolyte abnormalities: >  improved Hypokalemia, hypomagnesemia, hypophosphatemia: Continue aggressive repletion as needed.   History of PE: Continue Eliquis.   Normocytic anemia Iron deficiency anemia Baseline hemoglobin around 9.0 Continue oral iron supplementation   AKI on CKD stage IIIa  > resolved.   History of multiple myeloma: Metastatic bony lesion: Follows with Dr. Candise Che On immunotherapy   Goals of care discussion: Palliative consulted, patient wants to be full code. Family would like to speak with oncology regarding treatment plan versus hospice.     Pressure Injury 03/29/22 Buttocks Right Stage 1 -  Intact skin with non-blanchable redness of a localized area usually over a bony prominence. (Active)  03/29/22 2140  Location: Buttocks  Location Orientation: Right  Staging: Stage 1 -  Intact skin with non-blanchable redness of a localized area usually over a bony prominence.  Wound Description (Comments):   Present on Admission: Yes     Pressure Injury 05/28/22 Heel Right Unstageable - Full thickness tissue loss in which the base of the injury is covered by slough (yellow, tan, gray, green or brown) and/or eschar (tan, brown or black) in the wound bed. Open wound, broken from pressure & (Active)  05/28/22 1400  Location: Heel  Location Orientation: Right  Staging: Unstageable - Full thickness tissue loss in which the base of the injury is covered by slough (yellow, tan, gray, green or brown) and/or eschar (tan, brown or black) in the wound bed.  Wound Description (Comments): Open wound, broken from pressure & dry skin  Present on Admission: Yes     Pressure Injury 08/08/22 Buttocks Right;Left Deep Tissue Pressure Injury - Purple or maroon localized area of discolored intact skin or blood-filled blister due to damage of  underlying soft tissue from pressure and/or shear.  (Active)  08/08/22 1241  Location: Buttocks  Location Orientation: Right;Left  Staging: Deep Tissue Pressure Injury - Purple or maroon localized area of discolored intact skin or blood-filled blister due to damage of underlying soft tissue from pressure and/or shear.  Wound Description (Comments):   Present on Admission:      DVT prophylaxis: Eliquis Code Status: Full code Family Communication: No family at bedside Disposition Plan:  Status is: Inpatient Remains inpatient appropriate because: Not medically ready for discharge, multiple electrolyte abnormalities    Consultants:  Gastroenterology  Procedures: Scheduled flexible sigmoidoscopy and EGD. Rectal Tube  Antimicrobials:  Anti-infectives (From admission, onward)    None      Subjective: Patient was seen and examined at bedside. Overnight events noted.   Patient reports diarrhea is slightly improved,  patient has Flexi-Seal in place with dark-colored stools noted.   He underwent flexible sigmoidoscopy and EGD yesterday.  Objective: Vitals:   10/20/22 1425 10/20/22 2058 10/21/22 0525 10/21/22 1256  BP: (!) 152/76 133/71 (!) 153/76 (!) 140/81  Pulse: 70 67 67 74  Resp: 20 14 14 20   Temp: 98.2 F (36.8 C) 98.4 F (36.9 C) 98.5 F (36.9 C) 98.2 F (36.8 C)  TempSrc: Oral Oral Oral Oral  SpO2: 100% 99% 97% 100%  Weight:      Height:        Intake/Output Summary (Last 24 hours) at 10/21/2022 1344 Last data filed at 10/21/2022 0539 Gross per 24 hour  Intake 100 ml  Output 950 ml  Net -850 ml   Filed Weights   10/08/22 1704 10/08/22 2249  Weight: 81.6 kg 80.7 kg    Examination:  General exam: Appears calm and comfortable, deconditioned, not in any distress. Respiratory system: CTA bilaterally. Respiratory effort normal. RR 14 Cardiovascular system: S1 & S2 heard, RRR. No JVD, murmurs, rubs, gallops or clicks.  Gastrointestinal system: Abdomen is non distended, soft and non tender. Normal bowel sounds  heard. Central nervous system: Alert and oriented X 3. No focal neurological deficits. Extremities: Symmetric 5 x 5 power. Skin: No rashes, lesions or ulcers Psychiatry: Judgement and insight appear normal. Mood & affect appropriate.     Data Reviewed: I have personally reviewed following labs and imaging studies  CBC: Recent Labs  Lab 10/16/22 0728 10/17/22 0541 10/18/22 0524 10/19/22 0605 10/20/22 0608 10/21/22 0554  WBC 6.3 6.6 6.3 5.9 5.3 5.8  NEUTROABS 3.9 3.8 3.7 3.2 3.0  --   HGB 8.3* 8.2* 8.2* 8.3* 8.2* 8.5*  HCT 26.0* 25.6* 26.4* 26.3* 25.4* 26.3*  MCV 91.2 91.1 94.3 92.3 90.4 91.3  PLT 211 258 234 261 218 252   Basic Metabolic Panel: Recent Labs  Lab 10/15/22 0527 10/16/22 0728 10/17/22 0541 10/18/22 0524 10/19/22 0605 10/20/22 0608 10/21/22 0554  NA 136 138  --  140  --  137 138  K 3.0* 3.6  --  4.5  --  4.4 4.8  CL 111 114*  --  118*  --  113* 115*  CO2 17* 15*  --  17*  --  16* 17*  GLUCOSE 112* 108*  --  101*  --  100* 90  BUN 5* 6*  --  <5*  --  <5* <5*  CREATININE 0.92 0.94  --  1.01  --  0.83 0.85  CALCIUM 7.3* 7.3*  --  7.4*  --  7.4* 7.6*  MG 1.6* 1.8 1.5* 1.9 1.6* 2.1 1.9  PHOS 2.3* 1.6*  3.1 2.1* 1.9* 2.9 3.3   GFR: Estimated Creatinine Clearance: 68.2 mL/min (by C-G formula based on SCr of 0.85 mg/dL). Liver Function Tests: Recent Labs  Lab 10/20/22 0608  AST 19  ALT 10  ALKPHOS 93  BILITOT 0.3  PROT 5.0*  ALBUMIN 2.5*   No results for input(s): "LIPASE", "AMYLASE" in the last 168 hours. No results for input(s): "AMMONIA" in the last 168 hours. Coagulation Profile: No results for input(s): "INR", "PROTIME" in the last 168 hours. Cardiac Enzymes: No results for input(s): "CKTOTAL", "CKMB", "CKMBINDEX", "TROPONINI" in the last 168 hours. BNP (last 3 results) No results for input(s): "PROBNP" in the last 8760 hours. HbA1C: No results for input(s): "HGBA1C" in the last 72 hours. CBG: No results for input(s): "GLUCAP" in the last  168 hours. Lipid Profile: No results for input(s): "CHOL", "HDL", "LDLCALC", "TRIG", "CHOLHDL", "LDLDIRECT" in the last 72 hours. Thyroid Function Tests: No results for input(s): "TSH", "T4TOTAL", "FREET4", "T3FREE", "THYROIDAB" in the last 72 hours. Anemia Panel: No results for input(s): "VITAMINB12", "FOLATE", "FERRITIN", "TIBC", "IRON", "RETICCTPCT" in the last 72 hours. Sepsis Labs: No results for input(s): "PROCALCITON", "LATICACIDVEN" in the last 168 hours.  No results found for this or any previous visit (from the past 240 hour(s)).   Radiology Studies: No results found.  Scheduled Meds:  apixaban  5 mg Oral BID   cholestyramine light  4 g Oral TID   ferrous sulfate  325 mg Oral BID WC   lipase/protease/amylase  36,000 Units Oral TID AC   loperamide  2 mg Oral TID   mirtazapine  7.5 mg Oral QHS   phosphorus  500 mg Oral TID   psyllium  1 packet Oral Daily   Continuous Infusions:   LOS: 12 days    Time spent: 35 Mins    Willeen Niece, MD [facility]   If 7PM-7AM, please contact night-coverage CTA bilaterally.

## 2022-10-21 NOTE — TOC Progression Note (Signed)
Transition of Care [facility]) - Progression Note    Patient Details  Name: Frank Hernandez MRN: [id number] Date of Birth: [birth year]  [facility] CM/SW Contact  Beckie Busing, RN Phone [phone]  10/21/2022, 2:11 PM  Clinical Narrative:    TOC continues to follow. Per MD documentation family requesting conversation with oncology about treatment plan versus hospice.      Barriers to Discharge: Continued Medical Work up  Expected Discharge Plan and Services In-house Referral: NA Discharge Planning Services: CM Consult Post Acute Care Choice: NA Living arrangements for the past 2 months: Single Family Home                 DME Arranged: N/A DME Agency: NA       HH Arranged: NA           Social Determinants of Health (SDOH) Interventions SDOH Screenings   Food Insecurity: No Food Insecurity (10/08/2022)  Housing: Low Risk  (10/08/2022)  Transportation Needs: No Transportation Needs (10/08/2022)  Utilities: Not At Risk (10/08/2022)  Depression (PHQ2-9): Low Risk  (04/29/2022)  Tobacco Use: Low Risk  (10/20/2022)    Readmission Risk Interventions    10/13/2022    4:11 PM 09/09/2022   12:00 PM 08/16/2022   10:15 AM  Readmission Risk Prevention Plan  Transportation Screening Complete Complete Complete  Medication Review Oceanographer) Complete Complete Complete  PCP or Specialist appointment within 3-5 days of discharge Complete Complete Complete  HRI or Home Care Consult Complete Complete Complete  SW Recovery Care/Counseling Consult Complete Complete Complete  Palliative Care Screening Complete Not Applicable Not Applicable  Skilled Nursing Facility Patient Refused Complete Complete

## 2022-10-21 NOTE — Progress Notes (Addendum)
Daily Progress Note  DOA: 10/08/2022 Hospital Day: 14  Chief Complaint: Diarrhea  ASSESSMENT & PLAN   Brief Narrative:  Frank Hernandez is a 82 y.o. 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 Admitted to the hospital with hypokalemia and chronic diarrhea.   Chronic diarrhea ongoing prior to starting Daratuminab, chronically dilated colon on imaging. Negative GI pathogen panel, C. Difficile, Microsporidia spore  Previous EPEC 07/2022 treated with azithromycin x 3 days. Negative flex sig with biopsy 6/10. Normal ESR/CRP, TSH, celiac.  Negative fecal calprotectin. Low fecal elastase (could be false + in setting of loose stools) and started on Creon with meals. VIP in progress.Flex sig showed stool ( mucoid consistency w/ cholestyramine and iron particles visible) in the rectum and sigmoid on this unprepped exam- areas of mucosa seen were normal. Biopsies normal including no evidence of amyloidosis. -Continue Creon/Imodium TID daily -Continue cholestyramine TID daily -Await VIP results Patient is improving and stool output has decreased and stool consistency has improved.  Multiple myeloma with AL amyloidosis, subacute pathologic intertrochanteric fracture of his right femur s/p IM nailing and multiple bony lytic lesions in the pelvis and spine. Last dose of Daratumumab was 06/2022. Followed by oncologist Dr. Candise Che.   PE/DVT  -on Eliquis   Chronic normocytic anemia. Hg 8.2. MCV 89.9. On Ferrous Sulfate po every day. No overt GI bleeding.   -----------------------------------------------------------------------------------------------    [facility] Attending   I have taken an interval history, reviewed the chart and examined the patient. I agree with the Advanced Practitioner's note, impression and recommendations with the  following additions:  He is improved.  Biopsies of duodenum and rectum negative for amyloid.  I think he is close to dc - at least from GI perspective.  We will arrange GI f/u for after dc  Am signing off at this time - he should stay on current bowel regimen to control diarrhea and we can adjust as outpatient.  Iva Boop, MD, [facility] See Loretha Stapler on call - gastroenterology for best contact person 10/21/2022 5:59 PM   Subjective   Patient reports feeling good today. Flexi-Seal discontinued yesterday. He reports three bowel movements today that were mushy. No abdominal pain. No nausea or vomiting.  Objective   (8/21) Flex sigmoidoscopy Impression: - Stool ( mucoid consistency w/ cholestyramine and iron particles visible) in the rectum and in the sigmoid colon on this unprepped exam- areas of mucsa seen wre normal, Biopsied. R/ O amyloidosis   Recent Labs    10/19/22 0605 10/20/22 0608 10/21/22 0554  WBC 5.9 5.3 5.8  HGB 8.3* 8.2* 8.5*  HCT 26.3* 25.4* 26.3*  PLT 261 218 252   BMET Recent Labs    10/20/22 0608 10/21/22 0554  NA 137 138  K 4.4 4.8  CL 113* 115*  CO2 16* 17*  GLUCOSE 100* 90  BUN <5* <5*  CREATININE 0.83 0.85  CALCIUM 7.4* 7.6*   LFT Recent Labs    10/20/22 0608  PROT 5.0*  ALBUMIN 2.5*  AST 19  ALT 10  ALKPHOS 93  BILITOT 0.3   PT/INR No results for input(s): "LABPROT", "INR" in the last 72 hours.   Imaging:  DG Abd 2 Views CLINICAL DATA:  Ogilvie syndrome common diarrhea  EXAM: ABDOMEN - 2 VIEW  COMPARISON:  KUB 08/06/2022, 08/04/2022, and priors  FINDINGS: There is significant gaseous distention of the colon measuring up to approximately 8.4 cm in the transverse colon, similar to the study from 08/04/2022. There is no definite free intraperitoneal air. There is no abnormal soft tissue calcification.  The imaged lungs clear. There is no acute osseous abnormality. Postsurgical changes are noted in  both femurs.  IMPRESSION: Significant gaseous distention of the colon is similar to the study from 08/04/2022.  Electronically Signed   By: Lesia Hausen M.D.   On: 10/13/2022 11:33     Scheduled inpatient medications:   apixaban  5 mg Oral BID   cholestyramine light  4 g Oral TID   ferrous sulfate  325 mg Oral BID WC   lipase/protease/amylase  36,000 Units Oral TID AC   loperamide  2 mg Oral TID   mirtazapine  7.5 mg Oral QHS   phosphorus  500 mg Oral TID   psyllium  1 packet Oral Daily   Continuous inpatient infusions:  PRN inpatient medications: acetaminophen, mouth rinse  Vital signs in last 24 hours: Temp:  [97.7 F (36.5 C)-98.7 F (37.1 C)] 98.5 F (36.9 C) (08/22 0525) Pulse Rate:  [67-73] 67 (08/22 0525) Resp:  [14-24] 14 (08/22 0525) BP: (91-164)/(50-76) 153/76 (08/22 0525) SpO2:  [97 %-100 %] 97 % (08/22 0525) Last BM Date : 10/20/22  Intake/Output Summary (Last 24 hours) at 10/21/2022 1232 Last data filed at 10/21/2022 0539 Gross per 24 hour  Intake 100 ml  Output 950 ml  Net -850 ml    Intake/Output from previous day: 08/21 0701 - 08/22 0700 In: 100 [I.V.:100] Out: 1550 [Urine:1000; Stool:550] Intake/Output this shift: No intake/output data recorded.   Physical Exam:  General: Alert male in NAD Heart:  Regular rate and rhythm.  Pulmonary: Normal respiratory effort Abdomen: Soft, nondistended, nontender. Normal bowel sounds. Extremities: Trace extremity edema , legs elevated Neurologic: Alert and oriented Psych: Pleasant. Cooperative. Insight appears normal.    Principal Problem:   Hypokalemia Active Problems:   Essential hypertension   Normocytic anemia   Multiple myeloma not having achieved remission (HCC)   Acute renal failure superimposed on stage 3a chronic kidney disease (HCC)   Diarrhea   History of pulmonary embolism   Palliative care encounter   Need for emotional support   Colon distention     LOS: 12 days   Margarite Gouge  May ,NP 10/21/2022, 12:32 PM

## 2022-10-21 NOTE — Telephone Encounter (Signed)
 Left patient a message in regards to scheduled appointment times/dates

## 2022-10-22 DIAGNOSIS — E876 Hypokalemia: Secondary | ICD-10-CM | POA: Diagnosis not present

## 2022-10-22 MED ORDER — PSYLLIUM 95 % PO PACK: 1.0000 | PACK | Freq: Every day | ORAL | 1 refills | Status: DC

## 2022-10-22 MED ORDER — LOPERAMIDE HCL 2 MG PO CAPS: 2.0000 mg | ORAL_CAPSULE | Freq: Three times a day (TID) | ORAL | 0 refills | Status: DC

## 2022-10-22 MED ORDER — CHOLESTYRAMINE LIGHT 4 G PO PACK: 4.0000 g | PACK | Freq: Three times a day (TID) | ORAL | 1 refills | Status: DC

## 2022-10-22 MED ORDER — PANCRELIPASE (LIP-PROT-AMYL) 36000-114000 UNITS PO CPEP: 36000.0000 [IU] | ORAL_CAPSULE | Freq: Three times a day (TID) | ORAL | 1 refills | Status: DC

## 2022-10-22 NOTE — Discharge Instructions (Signed)
Advised to follow-up with primary care physician in 1 week. Advised to take cholestyramine 1 packet 3 times daily Advised to take Creon 1 capsule 3 times daily Advised to take Imodium 2 mg 3 times daily Advised to take Metamucil 1 packet daily Advised to follow-up with gastroenterology as scheduled

## 2022-10-22 NOTE — Progress Notes (Signed)
Physical Therapy Treatment Patient Details Name: Frank Hernandez MRN: [id number] DOB: 08-Aug-1940 Today's Date: 10/22/2022   History of Present Illness 82 year old with history of multiple myeloma, multiple bony metastatic lesion, normocytic anemia, CKD stage IIIa, DVT and PE on Eliquis presents from cancer center with severe ongoing hypokalemia on 10/08/22.  Patient does have history of intractable diarrhea, recently admitted for hyperkalemia and AKI in the context of chronic diarrhea.    PT Comments   Pt admitted with above diagnosis.  Pt currently with functional limitations due to the deficits listed below (see PT Problem List). Pt in bed when PT arrived. Pt stated he is planning on going home today. Pt states he is feeling better today. Pt required S for supine <> sit EOB with increased time and cues. Pt required CGA for sit to stand from EOB with min cues. Pt declined to transfer to recliner or perform gait tasks, pt agreeable to side stepping to R with RW with CGA and cues. Pt performed LE seated TE EOB. Pt left in bed all needs in place. Pt will benefit from acute skilled PT to increase their independence and safety with mobility to allow discharge.      If plan is discharge home, recommend the following: A little help with walking and/or transfers;A little help with bathing/dressing/bathroom;Direct supervision/assist for medications management;Assistance with cooking/housework;Assist for transportation;Help with stairs or ramp for entrance   Can travel by private vehicle     No  Equipment Recommendations  None recommended by PT    Recommendations for Other Services       Precautions / Restrictions Precautions Precautions: Fall Precaution Comments: rectal tube Restrictions Weight Bearing Restrictions: No     Mobility  Bed Mobility Overal bed mobility: Needs Assistance Bed Mobility: Supine to Sit, Sit to Supine Rolling: Supervision, Used rails Sidelying to sit: Supervision,  Used rails       General bed mobility comments: increased time and min cues for supine <> sit    Transfers Overall transfer level: Needs assistance Equipment used: Rolling walker (2 wheels) Transfers: Sit to/from Stand Sit to Stand: Contact guard assist           General transfer comment: pt declined to transfer to recliner and ambulate today due to pt reporting going home  pt able to perform sit to stand from EOB with CGA and side step to R with RW and CGA    Ambulation/Gait                   Stairs             Wheelchair Mobility     Tilt Bed    Modified Rankin (Stroke Patients Only)       Balance Overall balance assessment: Needs assistance Sitting-balance support: No upper extremity supported, Feet supported Sitting balance-Leahy Scale: Good     Standing balance support: Bilateral upper extremity supported, Reliant on assistive device for balance Standing balance-Leahy Scale: Fair Standing balance comment: RW and CGA                            Cognition Arousal: Alert Behavior During Therapy: WFL for tasks assessed/performed Overall Cognitive Status: Within Functional Limits for tasks assessed Area of Impairment: Safety/judgement, Awareness, Problem solving                 Orientation Level: Disoriented to, Time   Memory: Decreased short-term memory Following Commands: Follows one step  commands inconsistently Safety/Judgement: Decreased awareness of deficits   Problem Solving: Requires verbal cues, Requires tactile cues          Exercises General Exercises - Lower Extremity Ankle Circles/Pumps: AROM, Both, 15 reps Long Arc Quad: AROM, Both, Seated, 10 reps Hip ABduction/ADduction: AROM, Both, Seated, 15 reps Hip Flexion/Marching: AROM, Both, 20 reps, Seated    General Comments        Pertinent Vitals/Pain Pain Assessment Faces Pain Scale: Hurts a little bit Pain Location: right foot and rectal tube Pain  Descriptors / Indicators: Sore    Home Living                          Prior Function            PT Goals (current goals can now be found in the care plan section) Acute Rehab PT Goals PT Goal Formulation: With patient Time For Goal Achievement: 10/24/22 Potential to Achieve Goals: Good Progress towards PT goals: Progressing toward goals    Frequency    Min 1X/week      PT Plan      Co-evaluation              AM-PAC PT "6 Clicks" Mobility   Outcome Measure  Help needed turning from your back to your side while in a flat bed without using bedrails?: None Help needed moving from lying on your back to sitting on the side of a flat bed without using bedrails?: None Help needed moving to and from a bed to a chair (including a wheelchair)?: A Little Help needed standing up from a chair using your arms (e.g., wheelchair or bedside chair)?: A Little Help needed to walk in hospital room?: A Little Help needed climbing 3-5 steps with a railing? : A Lot 6 Click Score: 19    End of Session Equipment Utilized During Treatment: Gait belt Activity Tolerance: Patient tolerated treatment well Patient left: with call bell/phone within reach;in bed;with bed alarm set Nurse Communication: Mobility status PT Visit Diagnosis: Muscle weakness (generalized) (M62.81);Difficulty in walking, not elsewhere classified (R26.2);Other abnormalities of gait and mobility (R26.89)     Time: 1610-9604 PT Time Calculation (min) (ACUTE ONLY): 14 min  Charges:    $Therapeutic Exercise: 8-22 mins PT General Charges $$ ACUTE PT VISIT: 1 Visit                     Johnny Bridge, PT Acute Rehab   Jacqualyn Posey 10/22/2022, 12:34 PM

## 2022-10-22 NOTE — Discharge Summary (Signed)
Physician Discharge Summary  Frank Hernandez:[id number] DOB: 25-Jan-1941 DOA: 10/08/2022  PCP: [name] (Inactive)  Admit date: 10/08/2022  Discharge date: 10/22/2022  Admitted From: Home.  Disposition:  Home with HHPT  Recommendations for Outpatient Follow-up:  Follow up with PCP in 1-2 weeks. Please obtain BMP/CBC in one week. Advised to follow-up with gastroenterology as scheduled.  Discharge Medications: Cholestyramine 1 packet 3 times daily. Creon 1 capsule 3 times daily Imodium 2 mg 3 times daily. Metamucil 1 packet daily   Home Health:Home PT/OT Equipment/Devices:None  Discharge Condition: Stable CODE STATUS:Full code Diet recommendation: Heart Healthy   Brief [facility] Course: This 82 year old male with history of multiple myeloma, multiple bony metastatic lesions, normocytic anemia, CKD stage IIIa, DVT and PE on Eliquis presents from cancer center with severe ongoing hypokalemia. Patient does have history of intractable diarrhea, recently admitted for hyperkalemia and AKI in the context of chronic diarrhea. Patient was recently diagnosed with enteropathogenic E. coli and he completed azithromycin therapy X 3 weeks, but continues to have diarrhea. Flexible sigmoidoscopy on 6/10 with no definitive pathology. In the ED, noted to have multiple electrolyte abnormalities. Patient admitted for further management.  Gastroenterology was consulted.  GI is concerned about amyloidosis.  They recommended EGD and flexible sigmoidoscopy to rule out amyloidosis which was completed.  EGD and flexible sigmoidoscopy negative for amyloidosis.  Patient reports feeling improved with current regimen.  Diarrhea has improved.  GI signed off.  Patient wants to be discharged.  Patient being discharged home.  Discharge Medications: Cholestyramine 1 packet 3 times daily. Creon 1 capsule 3 times daily Imodium 2 mg 3 times daily. Metamucil 1 packet daily  Discharge Diagnoses:   Principal Problem:   Hypokalemia Active Problems:   Diarrhea   Essential hypertension   Normocytic anemia   Multiple myeloma not having achieved remission (HCC)   Acute renal failure superimposed on stage 3a chronic kidney disease (HCC)   History of pulmonary embolism   Palliative care encounter   Need for emotional support   Colon distention  Chronic intractable diarrhea, unknown etiology: He is being treated with multiple agents.   Initial thought was it is related to PPI so PPI was stopped, immunotherapy related?  Creon started due to low pancreatic fecal elastase.  Continue Lomotil 3 times daily, Metamucil, cholestyramine, Creon,. Recent enteropathogenic E. coli s/p azithromycin treatment x 3 weeks. Suspicion for amyloidosis but the [place] red stain was negative,  rectal biopsy.  C. difficile, GI pathogen panel all negative. Fecal calprotectin negative . GI following. KUB 10/13/2022-significant gaseous distention of the colon Cholestyramine started on 10/13/2022  Octreotide  started 10/13/2022 VIP/celiac/5-HIAA pending Patient underwent flex sigmoidoscopy and EGD on 10/20/2022. EGD was unremarkable, flexible sigmoidoscopy completed. Octreotide discontinued 10/20/22. GI signed off.  Diarrhea improved.  Patient discharged on Imodium, Creon, cholestyramine and Metamucil.   Multiple electrolyte abnormalities: >  improved Hypokalemia, hypomagnesemia, hypophosphatemia: Continue aggressive repletion as needed.   History of PE: Continue Eliquis.   Normocytic anemia Iron deficiency anemia Baseline hemoglobin around 9.0 Continue oral iron supplementation   AKI on CKD stage IIIa  > resolved.   History of multiple myeloma: Metastatic bony lesion: Follows with Dr. Candise Che On immunotherapy   Goals of care discussion: Palliative consulted, patient wants to be full code. Family would like to speak with oncology regarding treatment plan versus hospice.    Discharge  Instructions  Discharge Instructions     Call MD for:  difficulty breathing, headache or visual disturbances   Complete  by: As directed    Call MD for:  persistant dizziness or light-headedness   Complete by: As directed    Call MD for:  persistant nausea and vomiting   Complete by: As directed    Diet - low sodium heart healthy   Complete by: As directed    Diet Carb Modified   Complete by: As directed    Discharge instructions   Complete by: As directed    Advised to follow-up with primary care physician in 1 week. Advised to take cholestyramine 1 packet 3 times daily Advised to take Creon 1 capsule 3 times daily Advised to take Imodium 2 mg 3 times daily Advised to take Metamucil 1 packet daily Advised to follow-up with gastroenterology as scheduled   Increase activity slowly   Complete by: As directed       Allergies as of 10/22/2022   No Known Allergies      Medication List     TAKE these medications    acetaminophen 500 MG tablet Commonly known as: TYLENOL Take 1,000 mg by mouth every 8 (eight) hours as needed for moderate pain.   apixaban 5 MG Tabs tablet Commonly known as: ELIQUIS Take 1 tablet (5 mg total) by mouth 2 (two) times daily.   cholestyramine light 4 g packet Commonly known as: PREVALITE Take 1 packet (4 g total) by mouth 3 (three) times daily.   ferrous sulfate 325 (65 FE) MG tablet Take 1 tablet (325 mg total) by mouth 2 (two) times daily with a meal.   lipase/protease/amylase 78295 UNITS Cpep capsule Commonly known as: CREON Take 1 capsule (36,000 Units total) by mouth 3 (three) times daily before meals.   loperamide 2 MG capsule Commonly known as: IMODIUM Take 1 capsule (2 mg total) by mouth 3 (three) times daily.   mirtazapine 7.5 MG tablet Commonly known as: REMERON Take 7.5 mg by mouth at bedtime.   pantoprazole 40 MG tablet Commonly known as: PROTONIX Take 1 tablet (40 mg total) by mouth 2 (two) times daily.   psyllium 95 %  Pack Commonly known as: HYDROCIL/METAMUCIL Take 1 packet by mouth daily. Start taking on: October 23, 2022   Vitamin D (Ergocalciferol) 1.25 MG (50000 UNIT) Caps capsule Commonly known as: DRISDOL Take 1 capsule (50,000 Units total) by mouth once a week. What changed: additional instructions        Follow-up Information     [name] Follow up in 1 week(s).   Specialty: Surgery Contact information: [place]         Iva Boop, MD Follow up in 2 week(s).   Specialty: Gastroenterology Contact information: [address] [place] [address] [phone]         [facility] Follow up.   Specialty: Home Health Services Why: Your home health will resume with [facility]. The office will call you with start date. If you have any questions please call the number listed above. Contact information: [address] [place] [address] [phone]                No Known Allergies  Consultations: Gastroenterology   Procedures/Studies: DG Abd 2 Views  Result Date: 10/13/2022 CLINICAL DATA:  Ogilvie syndrome common diarrhea EXAM: ABDOMEN - 2 VIEW COMPARISON:  KUB 08/06/2022, 08/04/2022, and priors FINDINGS: There is significant gaseous distention of the colon measuring up to approximately 8.4 cm in the transverse colon, similar to the study from 08/04/2022. There is no definite free  intraperitoneal air. There is no abnormal soft tissue calcification. The imaged lungs clear. There is no acute osseous abnormality. Postsurgical changes are noted in both femurs. IMPRESSION: Significant gaseous distention of the colon is similar to the study from 08/04/2022. Electronically Signed   By: Lesia Hausen M.D.   On: 10/13/2022 11:33   (EGD and flexible sigmoidoscopy)   Subjective: Patient was seen and examined at bedside.  Overnight events noted.   Patient reports doing much better,  wants to be discharged. Diarrhea has improved.  Patient is being  discharged home.  Discharge Exam: Vitals:   10/21/22 2120 10/22/22 0618  BP: (!) 149/87 132/75  Pulse: 80 63  Resp: 20 20  Temp: 98.4 F (36.9 C) 98.4 F (36.9 C)  SpO2: 100% 100%   Vitals:   10/21/22 0525 10/21/22 1256 10/21/22 2120 10/22/22 0618  BP: (!) 153/76 (!) 140/81 (!) 149/87 132/75  Pulse: 67 74 80 63  Resp: 14 20 20 20   Temp: 98.5 F (36.9 C) 98.2 F (36.8 C) 98.4 F (36.9 C) 98.4 F (36.9 C)  TempSrc: Oral Oral Oral Oral  SpO2: 97% 100% 100% 100%  Weight:      Height:        General: Pt is alert, awake, not in acute distress Cardiovascular: RRR, S1/S2 +, no rubs, no gallops Respiratory: CTA bilaterally, no wheezing, no rhonchi Abdominal: Soft, NT, ND, bowel sounds + Extremities: no edema, no cyanosis    The results of significant diagnostics from this hospitalization (including imaging, microbiology, ancillary and laboratory) are listed below for reference.     Microbiology: No results found for this or any previous visit (from the past 240 hour(s)).   Labs: BNP (last 3 results) Recent Labs    07/29/22 0835 07/30/22 2304 08/09/22 1713  BNP 145.9* 357.6* 671.3*   Basic Metabolic Panel: Recent Labs  Lab 10/16/22 0728 10/17/22 0541 10/18/22 0524 10/19/22 0605 10/20/22 0608 10/21/22 0554  NA 138  --  140  --  137 138  K 3.6  --  4.5  --  4.4 4.8  CL 114*  --  118*  --  113* 115*  CO2 15*  --  17*  --  16* 17*  GLUCOSE 108*  --  101*  --  100* 90  BUN 6*  --  <5*  --  <5* <5*  CREATININE 0.94  --  1.01  --  0.83 0.85  CALCIUM 7.3*  --  7.4*  --  7.4* 7.6*  MG 1.8 1.5* 1.9 1.6* 2.1 1.9  PHOS 1.6* 3.1 2.1* 1.9* 2.9 3.3   Liver Function Tests: Recent Labs  Lab 10/20/22 0608  AST 19  ALT 10  ALKPHOS 93  BILITOT 0.3  PROT 5.0*  ALBUMIN 2.5*   No results for input(s): "LIPASE", "AMYLASE" in the last 168 hours. No results for input(s): "AMMONIA" in the last 168 hours. CBC: Recent Labs  Lab 10/16/22 0728 10/17/22 0541  10/18/22 0524 10/19/22 0605 10/20/22 0608 10/21/22 0554  WBC 6.3 6.6 6.3 5.9 5.3 5.8  NEUTROABS 3.9 3.8 3.7 3.2 3.0  --   HGB 8.3* 8.2* 8.2* 8.3* 8.2* 8.5*  HCT 26.0* 25.6* 26.4* 26.3* 25.4* 26.3*  MCV 91.2 91.1 94.3 92.3 90.4 91.3  PLT 211 258 234 261 218 252   Cardiac Enzymes: No results for input(s): "CKTOTAL", "CKMB", "CKMBINDEX", "TROPONINI" in the last 168 hours. BNP: Invalid input(s): "POCBNP" CBG: No results for input(s): "GLUCAP" in the last 168 hours. D-Dimer No results  for input(s): "DDIMER" in the last 72 hours. Hgb A1c No results for input(s): "HGBA1C" in the last 72 hours. Lipid Profile No results for input(s): "CHOL", "HDL", "LDLCALC", "TRIG", "CHOLHDL", "LDLDIRECT" in the last 72 hours. Thyroid function studies No results for input(s): "TSH", "T4TOTAL", "T3FREE", "THYROIDAB" in the last 72 hours.  Invalid input(s): "FREET3" Anemia work up No results for input(s): "VITAMINB12", "FOLATE", "FERRITIN", "TIBC", "IRON", "RETICCTPCT" in the last 72 hours. Urinalysis    Component Value Date/Time   COLORURINE YELLOW 10/08/2022 2033   APPEARANCEUR CLEAR 10/08/2022 2033   LABSPEC 1.006 10/08/2022 2033   PHURINE 6.0 10/08/2022 2033   GLUCOSEU NEGATIVE 10/08/2022 2033   HGBUR NEGATIVE 10/08/2022 2033   BILIRUBINUR NEGATIVE 10/08/2022 2033   KETONESUR NEGATIVE 10/08/2022 2033   PROTEINUR NEGATIVE 10/08/2022 2033   NITRITE NEGATIVE 10/08/2022 2033   LEUKOCYTESUR NEGATIVE 10/08/2022 2033   Sepsis Labs Recent Labs  Lab 10/18/22 0524 10/19/22 0605 10/20/22 0608 10/21/22 0554  WBC 6.3 5.9 5.3 5.8   Microbiology No results found for this or any previous visit (from the past 240 hour(s)).   Time coordinating discharge: Over 30 minutes  SIGNED:   Willeen Niece, MD  [facility] 10/22/2022, 1:46 PM Pager   If 7PM-7AM, please contact night-coverage

## 2022-10-22 NOTE — TOC Transition Note (Signed)
Transition of Care [facility]) - CM/SW Discharge Note   Patient Details  Name: Frank Hernandez MRN: [id number] Date of Birth: Oct 17, 1940  Transition of [facility]) CM/SW Contact:  Beckie Busing, RN Phone Number:[phone]  10/22/2022, 12:54 PM   Clinical Narrative:    Patient has discharge orders. Patient to discharge home with wife and home health. CM contacted Tresa Endo with [facility] to make her aware of discharge for home health services. AVS has been updated. There are currently no other needs. TOC will sign off.    Final next level of care: Home w Home Health Services Barriers to Discharge: No Barriers Identified   Patient Goals and CMS Choice CMS Medicare.gov Compare Post Acute Care list provided to::  (refused) Choice offered to / list presented to :  (refused)  Discharge Placement                         Discharge Plan and Services Additional resources added to the After Visit Summary for   In-house Referral: NA Discharge Planning Services: CM Consult Post Acute Care Choice: NA          DME Arranged: N/A DME Agency: NA       HH Arranged: NA HH Agency: [facility] Date HH Agency Contacted: 10/22/22 Time HH Agency Contacted: 1254 Representative spoke with at [facility] Agency: Tresa Endo  Social Determinants of Health (SDOH) Interventions SDOH Screenings   Food Insecurity: No Food Insecurity (10/08/2022)  Housing: Low Risk  (10/08/2022)  Transportation Needs: No Transportation Needs (10/08/2022)  Utilities: Not At Risk (10/08/2022)  Depression (PHQ2-9): Low Risk  (04/29/2022)  Tobacco Use: Low Risk  (10/20/2022)  Health Literacy: Adequate Health Literacy (10/22/2022)     Readmission Risk Interventions    10/13/2022    4:11 PM 09/09/2022   12:00 PM 08/16/2022   10:15 AM  Readmission Risk Prevention Plan  Transportation Screening Complete Complete Complete  Medication Review Oceanographer) Complete Complete Complete  PCP or Specialist appointment within 3-5  days of discharge Complete Complete Complete  HRI or Home Care Consult Complete Complete Complete  SW Recovery Care/Counseling Consult Complete Complete Complete  Palliative Care Screening Complete Not Applicable Not Applicable  Skilled Nursing Facility Patient Refused Complete Complete

## 2022-10-23 ENCOUNTER — Encounter (HOSPITAL_COMMUNITY): Payer: Self-pay | Admitting: Internal Medicine

## 2022-10-26 DIAGNOSIS — E860 Dehydration: Secondary | ICD-10-CM | POA: Diagnosis not present

## 2022-10-26 DIAGNOSIS — C679 Malignant neoplasm of bladder, unspecified: Secondary | ICD-10-CM | POA: Diagnosis not present

## 2022-10-26 DIAGNOSIS — C9000 Multiple myeloma not having achieved remission: Secondary | ICD-10-CM | POA: Diagnosis not present

## 2022-10-26 DIAGNOSIS — E872 Acidosis, unspecified: Secondary | ICD-10-CM | POA: Diagnosis not present

## 2022-10-26 DIAGNOSIS — I129 Hypertensive chronic kidney disease with stage 1 through stage 4 chronic kidney disease, or unspecified chronic kidney disease: Secondary | ICD-10-CM | POA: Diagnosis not present

## 2022-10-26 DIAGNOSIS — I2609 Other pulmonary embolism with acute cor pulmonale: Secondary | ICD-10-CM | POA: Diagnosis not present

## 2022-10-26 DIAGNOSIS — D631 Anemia in chronic kidney disease: Secondary | ICD-10-CM | POA: Diagnosis not present

## 2022-10-26 DIAGNOSIS — E875 Hyperkalemia: Secondary | ICD-10-CM | POA: Diagnosis not present

## 2022-10-26 DIAGNOSIS — N1831 Chronic kidney disease, stage 3a: Secondary | ICD-10-CM | POA: Diagnosis not present

## 2022-10-28 ENCOUNTER — Emergency Department (HOSPITAL_COMMUNITY): Payer: Medicare PPO

## 2022-10-28 ENCOUNTER — Other Ambulatory Visit: Payer: Self-pay

## 2022-10-28 ENCOUNTER — Observation Stay (HOSPITAL_COMMUNITY): Payer: Medicare PPO

## 2022-10-28 ENCOUNTER — Encounter (HOSPITAL_COMMUNITY): Payer: Self-pay

## 2022-10-28 ENCOUNTER — Inpatient Hospital Stay (HOSPITAL_COMMUNITY)
Admission: EM | Admit: 2022-10-28 | Discharge: 2022-10-30 | DRG: 682 | Disposition: A | Discharge status: Hospice | Payer: Medicare PPO | Attending: Internal Medicine | Admitting: Internal Medicine

## 2022-10-28 DIAGNOSIS — R109 Unspecified abdominal pain: Secondary | ICD-10-CM | POA: Diagnosis not present

## 2022-10-28 DIAGNOSIS — C7951 Secondary malignant neoplasm of bone: Secondary | ICD-10-CM

## 2022-10-28 DIAGNOSIS — K8689 Other specified diseases of pancreas: Secondary | ICD-10-CM | POA: Diagnosis present

## 2022-10-28 DIAGNOSIS — R195 Other fecal abnormalities: Secondary | ICD-10-CM | POA: Diagnosis not present

## 2022-10-28 DIAGNOSIS — E861 Hypovolemia: Secondary | ICD-10-CM | POA: Diagnosis present

## 2022-10-28 DIAGNOSIS — R197 Diarrhea, unspecified: Secondary | ICD-10-CM | POA: Diagnosis not present

## 2022-10-28 DIAGNOSIS — K529 Noninfective gastroenteritis and colitis, unspecified: Secondary | ICD-10-CM | POA: Diagnosis present

## 2022-10-28 DIAGNOSIS — I5032 Chronic diastolic (congestive) heart failure: Secondary | ICD-10-CM | POA: Diagnosis present

## 2022-10-28 DIAGNOSIS — I9589 Other hypotension: Secondary | ICD-10-CM | POA: Diagnosis present

## 2022-10-28 DIAGNOSIS — Z7189 Other specified counseling: Secondary | ICD-10-CM

## 2022-10-28 DIAGNOSIS — E8581 Light chain (AL) amyloidosis: Secondary | ICD-10-CM | POA: Diagnosis present

## 2022-10-28 DIAGNOSIS — Z79899 Other long term (current) drug therapy: Secondary | ICD-10-CM | POA: Diagnosis not present

## 2022-10-28 DIAGNOSIS — R55 Syncope and collapse: Secondary | ICD-10-CM | POA: Diagnosis not present

## 2022-10-28 DIAGNOSIS — M84551D Pathological fracture in neoplastic disease, right femur, subsequent encounter for fracture with routine healing: Secondary | ICD-10-CM | POA: Diagnosis present

## 2022-10-28 DIAGNOSIS — I4891 Unspecified atrial fibrillation: Secondary | ICD-10-CM

## 2022-10-28 DIAGNOSIS — E876 Hypokalemia: Secondary | ICD-10-CM | POA: Diagnosis present

## 2022-10-28 DIAGNOSIS — I959 Hypotension, unspecified: Secondary | ICD-10-CM | POA: Diagnosis not present

## 2022-10-28 DIAGNOSIS — K5981 Ogilvie syndrome: Secondary | ICD-10-CM

## 2022-10-28 DIAGNOSIS — K5939 Other megacolon: Secondary | ICD-10-CM | POA: Diagnosis present

## 2022-10-28 DIAGNOSIS — R627 Adult failure to thrive: Secondary | ICD-10-CM | POA: Diagnosis present

## 2022-10-28 DIAGNOSIS — D630 Anemia in neoplastic disease: Secondary | ICD-10-CM | POA: Diagnosis present

## 2022-10-28 DIAGNOSIS — N1831 Chronic kidney disease, stage 3a: Secondary | ICD-10-CM | POA: Diagnosis present

## 2022-10-28 DIAGNOSIS — R1111 Vomiting without nausea: Secondary | ICD-10-CM | POA: Diagnosis not present

## 2022-10-28 DIAGNOSIS — E872 Acidosis, unspecified: Secondary | ICD-10-CM | POA: Diagnosis not present

## 2022-10-28 DIAGNOSIS — N179 Acute kidney failure, unspecified: Secondary | ICD-10-CM | POA: Diagnosis present

## 2022-10-28 DIAGNOSIS — E860 Dehydration: Secondary | ICD-10-CM | POA: Diagnosis present

## 2022-10-28 DIAGNOSIS — C9000 Multiple myeloma not having achieved remission: Secondary | ICD-10-CM

## 2022-10-28 DIAGNOSIS — I130 Hypertensive heart and chronic kidney disease with heart failure and stage 1 through stage 4 chronic kidney disease, or unspecified chronic kidney disease: Secondary | ICD-10-CM | POA: Diagnosis present

## 2022-10-28 DIAGNOSIS — R9431 Abnormal electrocardiogram [ECG] [EKG]: Secondary | ICD-10-CM

## 2022-10-28 DIAGNOSIS — K56609 Unspecified intestinal obstruction, unspecified as to partial versus complete obstruction: Secondary | ICD-10-CM | POA: Diagnosis not present

## 2022-10-28 DIAGNOSIS — C679 Malignant neoplasm of bladder, unspecified: Secondary | ICD-10-CM | POA: Diagnosis present

## 2022-10-28 DIAGNOSIS — D649 Anemia, unspecified: Secondary | ICD-10-CM | POA: Diagnosis not present

## 2022-10-28 DIAGNOSIS — Z86711 Personal history of pulmonary embolism: Secondary | ICD-10-CM

## 2022-10-28 DIAGNOSIS — K4090 Unilateral inguinal hernia, without obstruction or gangrene, not specified as recurrent: Secondary | ICD-10-CM | POA: Diagnosis present

## 2022-10-28 DIAGNOSIS — I129 Hypertensive chronic kidney disease with stage 1 through stage 4 chronic kidney disease, or unspecified chronic kidney disease: Secondary | ICD-10-CM | POA: Diagnosis not present

## 2022-10-28 DIAGNOSIS — E43 Unspecified severe protein-calorie malnutrition: Secondary | ICD-10-CM | POA: Diagnosis present

## 2022-10-28 DIAGNOSIS — I771 Stricture of artery: Secondary | ICD-10-CM | POA: Diagnosis not present

## 2022-10-28 DIAGNOSIS — K4020 Bilateral inguinal hernia, without obstruction or gangrene, not specified as recurrent: Secondary | ICD-10-CM | POA: Diagnosis not present

## 2022-10-28 DIAGNOSIS — K5730 Diverticulosis of large intestine without perforation or abscess without bleeding: Secondary | ICD-10-CM | POA: Diagnosis not present

## 2022-10-28 DIAGNOSIS — I10 Essential (primary) hypertension: Secondary | ICD-10-CM

## 2022-10-28 DIAGNOSIS — I2609 Other pulmonary embolism with acute cor pulmonale: Secondary | ICD-10-CM | POA: Diagnosis not present

## 2022-10-28 DIAGNOSIS — Z66 Do not resuscitate: Secondary | ICD-10-CM | POA: Diagnosis present

## 2022-10-28 DIAGNOSIS — Z7901 Long term (current) use of anticoagulants: Secondary | ICD-10-CM

## 2022-10-28 DIAGNOSIS — E875 Hyperkalemia: Secondary | ICD-10-CM | POA: Diagnosis not present

## 2022-10-28 DIAGNOSIS — Z993 Dependence on wheelchair: Secondary | ICD-10-CM

## 2022-10-28 DIAGNOSIS — R001 Bradycardia, unspecified: Secondary | ICD-10-CM | POA: Diagnosis not present

## 2022-10-28 DIAGNOSIS — R112 Nausea with vomiting, unspecified: Secondary | ICD-10-CM | POA: Diagnosis not present

## 2022-10-28 DIAGNOSIS — Z6826 Body mass index (BMI) 26.0-26.9, adult: Secondary | ICD-10-CM

## 2022-10-28 DIAGNOSIS — D631 Anemia in chronic kidney disease: Secondary | ICD-10-CM | POA: Diagnosis not present

## 2022-10-28 LAB — CBC WITH DIFFERENTIAL/PLATELET
Abs Immature Granulocytes: 0.02 10*3/uL (ref 0.00–0.07)
Basophils Absolute: 0 10*3/uL (ref 0.0–0.1)
Basophils Relative: 0 %
Eosinophils Absolute: 0 10*3/uL (ref 0.0–0.5)
Eosinophils Relative: 1 %
HCT: 30.7 % — ABNORMAL LOW (ref 39.0–52.0)
Hemoglobin: 9.7 g/dL — ABNORMAL LOW (ref 13.0–17.0)
Immature Granulocytes: 0 %
Lymphocytes Relative: 10 %
Lymphs Abs: 0.6 10*3/uL — ABNORMAL LOW (ref 0.7–4.0)
MCH: 29.4 pg (ref 26.0–34.0)
MCHC: 31.6 g/dL (ref 30.0–36.0)
MCV: 93 fL (ref 80.0–100.0)
Monocytes Absolute: 0.5 10*3/uL (ref 0.1–1.0)
Monocytes Relative: 8 %
Neutro Abs: 4.8 10*3/uL (ref 1.7–7.7)
Neutrophils Relative %: 81 %
Platelets: 385 10*3/uL (ref 150–400)
RBC: 3.3 MIL/uL — ABNORMAL LOW (ref 4.22–5.81)
RDW: 17.2 % — ABNORMAL HIGH (ref 11.5–15.5)
WBC: 5.9 10*3/uL (ref 4.0–10.5)
nRBC: 0 % (ref 0.0–0.2)

## 2022-10-28 LAB — CBC
HCT: 29 % — ABNORMAL LOW (ref 39.0–52.0)
Hemoglobin: 9.4 g/dL — ABNORMAL LOW (ref 13.0–17.0)
MCH: 29.4 pg (ref 26.0–34.0)
MCHC: 32.4 g/dL (ref 30.0–36.0)
MCV: 90.6 fL (ref 80.0–100.0)
Platelets: 406 10*3/uL — ABNORMAL HIGH (ref 150–400)
RBC: 3.2 MIL/uL — ABNORMAL LOW (ref 4.22–5.81)
RDW: 16.9 % — ABNORMAL HIGH (ref 11.5–15.5)
WBC: 4.9 10*3/uL (ref 4.0–10.5)
nRBC: 0 % (ref 0.0–0.2)

## 2022-10-28 LAB — TROPONIN I (HIGH SENSITIVITY): Troponin I (High Sensitivity): 16 ng/L (ref <18)

## 2022-10-28 LAB — URINALYSIS, ROUTINE W REFLEX MICROSCOPIC
Bilirubin Urine: NEGATIVE
Glucose, UA: NEGATIVE mg/dL
Hgb urine dipstick: NEGATIVE
Ketones, ur: NEGATIVE mg/dL
Leukocytes,Ua: NEGATIVE
Nitrite: NEGATIVE
Protein, ur: NEGATIVE mg/dL
Specific Gravity, Urine: 1.013 (ref 1.005–1.030)
pH: 5 (ref 5.0–8.0)

## 2022-10-28 LAB — IRON AND TIBC
Iron: 23 ug/dL — ABNORMAL LOW (ref 45–182)
Saturation Ratios: 11 % — ABNORMAL LOW (ref 17.9–39.5)
TIBC: 217 ug/dL — ABNORMAL LOW (ref 250–450)
UIBC: 194 ug/dL

## 2022-10-28 LAB — COMPREHENSIVE METABOLIC PANEL
ALT: 13 U/L (ref 0–44)
AST: 26 U/L (ref 15–41)
Albumin: 3.7 g/dL (ref 3.5–5.0)
Alkaline Phosphatase: 100 U/L (ref 38–126)
Anion gap: 9 (ref 5–15)
BUN: 16 mg/dL (ref 8–23)
CO2: 15 mmol/L — ABNORMAL LOW (ref 22–32)
Calcium: 7.3 mg/dL — ABNORMAL LOW (ref 8.9–10.3)
Chloride: 111 mmol/L (ref 98–111)
Creatinine, Ser: 1.8 mg/dL — ABNORMAL HIGH (ref 0.61–1.24)
GFR, Estimated: 37 mL/min — ABNORMAL LOW (ref >60)
Glucose, Bld: 161 mg/dL — ABNORMAL HIGH (ref 70–99)
Potassium: 2.7 mmol/L — CL (ref 3.5–5.1)
Sodium: 135 mmol/L (ref 135–145)
Total Bilirubin: 0.7 mg/dL (ref 0.3–1.2)
Total Protein: 6.6 g/dL (ref 6.5–8.1)

## 2022-10-28 LAB — RETICULOCYTES
Immature Retic Fract: 20.6 % — ABNORMAL HIGH (ref 2.3–15.9)
RBC.: 3.36 MIL/uL — ABNORMAL LOW (ref 4.22–5.81)
Retic Count, Absolute: 72.6 10*3/uL (ref 19.0–186.0)
Retic Ct Pct: 2.2 % (ref 0.4–3.1)

## 2022-10-28 LAB — BASIC METABOLIC PANEL
Anion gap: 12 (ref 5–15)
BUN: 17 mg/dL (ref 8–23)
CO2: 13 mmol/L — ABNORMAL LOW (ref 22–32)
Calcium: 7.4 mg/dL — ABNORMAL LOW (ref 8.9–10.3)
Chloride: 112 mmol/L — ABNORMAL HIGH (ref 98–111)
Creatinine, Ser: 1.59 mg/dL — ABNORMAL HIGH (ref 0.61–1.24)
GFR, Estimated: 43 mL/min — ABNORMAL LOW (ref >60)
Glucose, Bld: 103 mg/dL — ABNORMAL HIGH (ref 70–99)
Potassium: 3.1 mmol/L — ABNORMAL LOW (ref 3.5–5.1)
Sodium: 137 mmol/L (ref 135–145)

## 2022-10-28 LAB — VASOACTIVE INTESTINAL PEPTIDE (VIP): Vasoactive Intest Polypeptide: 41.3 pg/mL (ref 0.0–58.8)

## 2022-10-28 LAB — CK: Total CK: 142 U/L (ref 49–397)

## 2022-10-28 LAB — MAGNESIUM: Magnesium: 1.6 mg/dL — ABNORMAL LOW (ref 1.7–2.4)

## 2022-10-28 LAB — PHOSPHORUS: Phosphorus: 2.2 mg/dL — ABNORMAL LOW (ref 2.5–4.6)

## 2022-10-28 LAB — FERRITIN: Ferritin: 463 ng/mL — ABNORMAL HIGH (ref 24–336)

## 2022-10-28 LAB — LIPASE, BLOOD: Lipase: 24 U/L (ref 11–51)

## 2022-10-28 LAB — PROCALCITONIN: Procalcitonin: 0.25 ng/mL

## 2022-10-28 LAB — CREATININE, URINE, RANDOM: Creatinine, Urine: 68 mg/dL

## 2022-10-28 LAB — SODIUM, URINE, RANDOM: Sodium, Ur: 12 mmol/L

## 2022-10-28 MED ORDER — FERROUS SULFATE 325 (65 FE) MG PO TABS
325.0000 mg | ORAL_TABLET | Freq: Two times a day (BID) | ORAL | Status: DC
  Administered 2022-10-29 – 2022-10-30 (×3): 325 mg via ORAL
  Filled 2022-10-28 (×3): qty 1

## 2022-10-28 MED ORDER — SODIUM CHLORIDE 0.9 % IV SOLN: INTRAVENOUS | Status: DC

## 2022-10-28 MED ORDER — POTASSIUM CHLORIDE 10 MEQ/100ML IV SOLN
10.0000 meq | INTRAVENOUS | Status: AC
  Administered 2022-10-28 (×2): 10 meq via INTRAVENOUS
  Filled 2022-10-28: qty 100

## 2022-10-28 MED ORDER — SODIUM CHLORIDE (PF) 0.9 % IJ SOLN
INTRAMUSCULAR | Status: AC
  Filled 2022-10-28: qty 50

## 2022-10-28 MED ORDER — ACETAMINOPHEN 650 MG RE SUPP: 650.0000 mg | Freq: Four times a day (QID) | RECTAL | Status: DC | PRN

## 2022-10-28 MED ORDER — HYDROCODONE-ACETAMINOPHEN 5-325 MG PO TABS: 1.0000 | ORAL_TABLET | ORAL | Status: DC | PRN

## 2022-10-28 MED ORDER — IOHEXOL 300 MG/ML  SOLN
80.0000 mL | Freq: Once | INTRAMUSCULAR | Status: AC | PRN
  Administered 2022-10-28: 80 mL via INTRAVENOUS

## 2022-10-28 MED ORDER — APIXABAN 5 MG PO TABS
5.0000 mg | ORAL_TABLET | Freq: Two times a day (BID) | ORAL | Status: DC
  Administered 2022-10-29 – 2022-10-30 (×4): 5 mg via ORAL
  Filled 2022-10-28 (×4): qty 1

## 2022-10-28 MED ORDER — PANTOPRAZOLE SODIUM 40 MG PO TBEC
40.0000 mg | DELAYED_RELEASE_TABLET | Freq: Two times a day (BID) | ORAL | Status: DC
  Administered 2022-10-29 – 2022-10-30 (×4): 40 mg via ORAL
  Filled 2022-10-28 (×4): qty 1

## 2022-10-28 MED ORDER — POTASSIUM CHLORIDE 10 MEQ/100ML IV SOLN
10.0000 meq | INTRAVENOUS | Status: AC
  Administered 2022-10-29 (×4): 10 meq via INTRAVENOUS
  Filled 2022-10-28 (×4): qty 100

## 2022-10-28 MED ORDER — SODIUM CHLORIDE 0.9 % IV BOLUS
1000.0000 mL | Freq: Once | INTRAVENOUS | Status: AC
  Administered 2022-10-28: 1000 mL via INTRAVENOUS

## 2022-10-28 MED ORDER — ACETAMINOPHEN 325 MG PO TABS
650.0000 mg | ORAL_TABLET | Freq: Four times a day (QID) | ORAL | Status: DC | PRN
  Administered 2022-10-29: 650 mg via ORAL
  Filled 2022-10-28: qty 2

## 2022-10-28 MED ORDER — MAGNESIUM SULFATE 2 GM/50ML IV SOLN
2.0000 g | Freq: Once | INTRAVENOUS | Status: AC
  Administered 2022-10-29: 2 g via INTRAVENOUS
  Filled 2022-10-28: qty 50

## 2022-10-28 MED ORDER — POTASSIUM CHLORIDE 10 MEQ/100ML IV SOLN
10.0000 meq | Freq: Once | INTRAVENOUS | Status: AC
  Administered 2022-10-28: 10 meq via INTRAVENOUS
  Filled 2022-10-28: qty 100

## 2022-10-28 MED ORDER — THIAMINE HCL 100 MG/ML IJ SOLN
100.0000 mg | Freq: Every day | INTRAMUSCULAR | Status: DC
  Administered 2022-10-29 – 2022-10-30 (×2): 100 mg via INTRAVENOUS
  Filled 2022-10-28 (×2): qty 2

## 2022-10-28 MED ORDER — LOPERAMIDE HCL 2 MG PO CAPS
2.0000 mg | ORAL_CAPSULE | Freq: Three times a day (TID) | ORAL | Status: DC
  Administered 2022-10-29 – 2022-10-30 (×5): 2 mg via ORAL
  Filled 2022-10-28 (×5): qty 1

## 2022-10-28 MED ORDER — PANCRELIPASE (LIP-PROT-AMYL) 12000-38000 UNITS PO CPEP
36000.0000 [IU] | ORAL_CAPSULE | Freq: Three times a day (TID) | ORAL | Status: DC
  Administered 2022-10-29 – 2022-10-30 (×5): 36000 [IU] via ORAL
  Filled 2022-10-28 (×5): qty 3

## 2022-10-28 NOTE — ED Notes (Signed)
Annabelle Harman (Son) to patient wants to be called when provider is at bedside. [phone]

## 2022-10-28 NOTE — Assessment & Plan Note (Signed)
Chronic recurrent Has had extensive workup with GI if persists or becomes severe would recommend a GI consult will send a message to [facility] at night

## 2022-10-28 NOTE — ED Provider Triage Note (Signed)
Emergency Medicine Provider Triage Evaluation Note  Frank Hernandez , a 82 y.o. male  was evaluated in triage.  Pt complains of abdominal pain with associated nausea and vomiting ongoing for couple days but worse today.  Similar to his prior episode of SBO.[name] Kitchen  Review of Systems  Positive: As above Negative: As above  Physical Exam  BP 104/68 (BP Location: Right Arm)   Pulse 67   Temp 97.7 F (36.5 C) (Oral)   Resp 16   Ht 5\' 9"  (1.753 m)   Wt 80.7 kg   SpO2 100%   BMI 26.27 kg/m  Gen:   Awake, no distress   Resp:  Normal effort  MSK:   Moves extremities without difficulty Other:    Medical Decision Making  Medically screening exam initiated at 3:39 PM.  Appropriate orders placed.  Frank Hernandez was informed that the remainder of the evaluation will be completed by another provider, this initial triage assessment does not replace that evaluation, and the importance of remaining in the ED until their evaluation is complete.     Marita Kansas, PA-C 10/28/22 1539

## 2022-10-28 NOTE — Assessment & Plan Note (Signed)
-   currently appears to be slightly on the dry side, hold home diuretics for tonight and restart when appears euvolemic, carefuly follow fluid status and Cr  

## 2022-10-28 NOTE — Assessment & Plan Note (Signed)
 Obtain anemia panel  Transfuse for Hg <7 , rapidly dropping or  if symptomatic

## 2022-10-28 NOTE — Assessment & Plan Note (Signed)
Will rehydrate and follow fluid status orthostatics prior to discharge

## 2022-10-28 NOTE — Assessment & Plan Note (Signed)
-  will monitor on tele avoid QT prolonging medications, rehydrate correct electrolytes ? ?

## 2022-10-28 NOTE — ED Triage Notes (Signed)
Patient BIB EMS for nausea, vomiting, abdominal pain, and dehydration x 3 days. Patient has hx of multiple myeloma.   VSS, NAD

## 2022-10-28 NOTE — Assessment & Plan Note (Signed)
Chronic. 

## 2022-10-28 NOTE — Assessment & Plan Note (Signed)
Followed by oncology 

## 2022-10-28 NOTE — Assessment & Plan Note (Addendum)
In the setting of hypotension secondary to hypovolemia Rehydrate monitor blood pressure Observe on progressive   completion obtain EKG and troponin Last echo was in May 2024 and showed grade 1 diastolic dysfunction

## 2022-10-28 NOTE — Assessment & Plan Note (Signed)
Creatinine at baseline now down to 0.8 this suspect some of her creatinine decrease in the setting of muscle atrophy  -chronic avoid nephrotoxic medications such as NSAIDs, Vanco Zosyn combo,  avoid hypotension, continue to follow renal function

## 2022-10-28 NOTE — Assessment & Plan Note (Signed)
Recurrent Replace and recheck Check other electrolytes and replace as needed

## 2022-10-28 NOTE — Assessment & Plan Note (Signed)
ECG unclear if true A.fib, pt already on eliquis will continue Rate controlled Correct electrolytes Repeat echo

## 2022-10-28 NOTE — Assessment & Plan Note (Signed)
Allow permissive hypertension for tonight 

## 2022-10-28 NOTE — Assessment & Plan Note (Signed)
Chronic CT abdomen today showed distended colon

## 2022-10-28 NOTE — Subjective & Objective (Signed)
Patient with recurrent admissions for recurrent AKI and hypokalemia in the setting of chronic diarrhea Food does not taste right so he does not eat very well Followed by [facility] gastroenterology At some point had workup for amyloidosis which eventually was negative Patient was recently diagnosed with enteropathogenic E. coli and he completed azithromycin therapy X 3 weeks, but continues to have diarrhea.   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 difficile has been negative Flex Sig done in June 2024 unrevealing CT in May showed mild global pancreatic atrophy Last admission on 9 August patient eventually went home on 23 August But since then continued to deteriorate at home with decreased p.o. intake Also associated nausea and vomiting and abdominal pain feeling dehydrated for the past 3 days At the point where he started to feel lightheaded and possibly had a syncopal episode initially found to have blood pressures in the 70s but later improved Found to be in AKI with potassium down to 2.7

## 2022-10-28 NOTE — Assessment & Plan Note (Signed)
Rehydrate obtain electrolytes and continue to follow

## 2022-10-28 NOTE — ED Provider Notes (Signed)
[place] EMERGENCY DEPARTMENT AT [facility] Provider Note   CSN: [id number] Arrival date & time: 10/28/22  1441     History  Chief Complaint  Patient presents with   Emesis    Frank Hernandez is a 82 y.o. male history of multiple myeloma, CKD, DVT and PE on Eliquis here presenting with vomiting.  Patient was recently admitted for small bowel obstruction.  Patient then was admitted to the hospital and discharged a week ago for persistent hypokalemia and AKI.  Patient has chronic diarrhea.  Patient had negative C. difficile at that time and had EGD and colonoscopy that did not show amyloidosis.  Patient continues to have poor appetite after discharge.  Patient states that he continues to have diarrhea and nausea vomiting.  The history is provided by the patient.       Home Medications Prior to Admission medications   Medication Sig Start Date End Date Taking? Authorizing Provider  acetaminophen (TYLENOL) 500 MG tablet Take 1,000 mg by mouth every 8 (eight) hours as needed for moderate pain.    [provider]  apixaban (ELIQUIS) 5 MG TABS tablet Take 1 tablet (5 mg total) by mouth 2 (two) times daily. 08/16/22   Azucena Fallen, MD  cholestyramine light (PREVALITE) 4 g packet Take 1 packet (4 g total) by mouth 3 (three) times daily. 10/22/22   Willeen Niece, MD  ferrous sulfate 325 (65 FE) MG tablet Take 1 tablet (325 mg total) by mouth 2 (two) times daily with a meal. 06/01/22   Rodolph Bong, MD  lipase/protease/amylase (CREON) 36000 UNITS CPEP capsule Take 1 capsule (36,000 Units total) by mouth 3 (three) times daily before meals. 10/22/22   Willeen Niece, MD  loperamide (IMODIUM) 2 MG capsule Take 1 capsule (2 mg total) by mouth 3 (three) times daily. 10/22/22   Willeen Niece, MD  mirtazapine (REMERON) 7.5 MG tablet Take 7.5 mg by mouth at bedtime. 10/01/22   [provider]  pantoprazole (PROTONIX) 40 MG tablet Take 1 tablet (40 mg total)  by mouth 2 (two) times daily. 08/16/22   Azucena Fallen, MD  psyllium (HYDROCIL/METAMUCIL) 95 % PACK Take 1 packet by mouth daily. 10/23/22   Willeen Niece, MD  Vitamin D, Ergocalciferol, (DRISDOL) 1.25 MG (50000 UNIT) CAPS capsule Take 1 capsule (50,000 Units total) by mouth once a week. Patient taking differently: Take 50,000 Units by mouth once a week. tuesday 07/09/22   Johney Maine, MD      Allergies    Patient has no known allergies.    Review of Systems   Review of Systems  Gastrointestinal:  Positive for diarrhea and vomiting.  All other systems reviewed and are negative.   Physical Exam Updated Vital Signs BP 104/68 (BP Location: Right Arm)   Pulse 67   Temp 97.7 F (36.5 C) (Oral)   Resp 16   Ht 5\' 9"  (1.753 m)   Wt 80.7 kg   SpO2 100%   BMI 26.27 kg/m  Physical Exam Vitals and nursing note reviewed.  Constitutional:      Comments: Chronically ill and dehydrated  HENT:     Head: Normocephalic.     Nose: Nose normal.     Mouth/Throat:     Mouth: Mucous membranes are dry.  Eyes:     Extraocular Movements: Extraocular movements intact.     Pupils: Pupils are equal, round, and reactive to light.  Cardiovascular:     Rate and Rhythm: Normal  rate and regular rhythm.     Pulses: Normal pulses.     Heart sounds: Normal heart sounds.  Pulmonary:     Effort: Pulmonary effort is normal.     Breath sounds: Normal breath sounds.  Abdominal:     Comments: Mildly distended and mild epigastric tenderness  Musculoskeletal:        General: Normal range of motion.     Cervical back: Normal range of motion and neck supple.  Skin:    General: Skin is warm.     Capillary Refill: Capillary refill takes less than 2 seconds.  Neurological:     General: No focal deficit present.     Mental Status: He is oriented to person, place, and time.     ED Results / Procedures / Treatments   Labs (all labs ordered are listed, but only abnormal results are  displayed) Labs Reviewed  CBC - Abnormal; Notable for the following components:      Result Value   RBC 3.20 (*)    Hemoglobin 9.4 (*)    HCT 29.0 (*)    RDW 16.9 (*)    Platelets 406 (*)    All other components within normal limits  LIPASE, BLOOD  COMPREHENSIVE METABOLIC PANEL  URINALYSIS, ROUTINE W REFLEX MICROSCOPIC    EKG None  Radiology No results found.  Procedures Procedures    Angiocath insertion Performed by: Frank Hernandez  Consent: Verbal consent obtained. Risks and benefits: risks, benefits and alternatives were discussed Time out: Immediately prior to procedure a "time out" was called to verify the correct patient, procedure, equipment, support staff and site/side marked as required.  Preparation: Patient was prepped and draped in the usual sterile fashion.  Vein Location: L antecube  Ultrasound Guided  Gauge: 20 long   Normal blood return and flush without difficulty Patient tolerance: Patient tolerated the procedure well with no immediate complications.  CRITICAL CARE Performed by: Frank Hernandez   Total critical care time: 30 minutes  Critical care time was exclusive of separately billable procedures and treating other patients.  Critical care was necessary to treat or prevent imminent or life-threatening deterioration.  Critical care was time spent personally by me on the following activities: development of treatment plan with patient and/or surrogate as well as nursing, discussions with consultants, evaluation of patient's response to treatment, examination of patient, obtaining history from patient or surrogate, ordering and performing treatments and interventions, ordering and review of laboratory studies, ordering and review of radiographic studies, pulse oximetry and re-evaluation of patient's condition.   Medications Ordered in ED Medications  sodium chloride 0.9 % bolus 1,000 mL (has no administration in time range)    ED Course/ Medical  Decision Making/ A&P                                 Medical Decision Making Frank Hernandez is a 82 y.o. male here presenting with abdominal pain and vomiting.  This is a recurrent issue.  He also has persistent diarrhea as well.  Concern for possible persistent hypokalemia versus SBO.  Plan to get CBC and CMP and CT abdomen pelvis.  6:02 PM Patient has potassium of 2.7.  Patient also has AKI of 1.8.  Patient does have persistent dilation of the colon.  Patient does have a inguinal hernia that is reducible.  Patient does not have significant abdominal tenderness after pain medicine.  I do  not think he has a new obstruction and these are chronic findings.  Patient at this point will be admitted for hypokalemia.  Now family is at bedside and they said that he actually had a syncopal episode blood pressure went to the 70s.  At this point patient will be admitted for persistent hypokalemia and AKI   Problems Addressed: AKI (acute kidney injury) [facility]): acute illness or injury Hypokalemia: acute illness or injury  Amount and/or Complexity of Data Reviewed Labs: ordered. Decision-making details documented in ED Course. ECG/medicine tests: ordered and independent interpretation performed. Decision-making details documented in ED Course.  Risk Prescription drug management. Decision regarding hospitalization.    Final Clinical Impression(s) / ED Diagnoses Final diagnoses:  None    Rx / DC Orders ED Discharge Orders     None         Charlynne Pander, MD 10/28/22 [phone]

## 2022-10-28 NOTE — H&P (Signed)
Frank Hernandez:[id number] DOB: 1940/09/12 DOA: 10/28/2022     PCP: [name] (Inactive)   Outpatient Specialists:   CARDS: Gaston Islam., NP     Oncology   Dr.Kale  G Dr. Leone Payor ([facility])    Patient arrived to ER on 10/28/22 at 1441 Referred by Attending Charlynne Pander, MD   Patient coming from:    home Lives With family   Chief Complaint:   Chief Complaint  Patient presents with   Emesis    HPI: OSHEA Hernandez is a 82 y.o. 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 coli and he completed azithromycin therapy X 3 weeks, but continues to have diarrhea.   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 difficile has been negative Flex Sig done in June 2024 unrevealing CT in May showed mild global pancreatic atrophy Last admission on 9 August patient eventually went home on 23 August But since then continued to deteriorate at home with decreased p.o. intake Also associated nausea and vomiting and abdominal pain feeling dehydrated for the past 3 days At the point where he started to feel lightheaded and possibly had a syncopal episode initially found to have blood pressures in the 70s but later improved Found to be in AKI with potassium down to 2.7    Reports the water tastes nasty every thing taste like metal He has  chronic wound of the right foot that bothers him  Denies significant ETOH intake   Does not smoke   Lab Results  Component Value Date   SARSCOV2NAA NEGATIVE 07/29/2022   SARSCOV2NAA NEGATIVE 03/23/2022   SARSCOV2NAA NEGATIVE 06/21/2020   SARSCOV2NAA POSITIVE (A) 06/20/2020        Regarding pertinent Chronic problems:    chronic CHF diastolic - last echo Recent Results (from the past 95621 hour(s))  ECHOCARDIOGRAM COMPLETE   Collection Time: 07/30/22  2:40 PM  Result Value   Weight 3,184   Height 69   BP 164/86   S' Lateral 2.60   AR max vel 2.53   AV Area VTI 2.35   AV Mean grad 6.0   AV Peak grad 10.8   Ao pk vel 1.64   P 1/2 time 434   AV Area mean vel 2.59   Est EF 65 - 70%   Narrative      ECHOCARDIOGRAM REPORT     IMPRESSIONS    1. Left ventricular ejection fraction, by estimation, is 65 to 70%. The left ventricle has normal function. The left ventricle has no regional wall motion abnormalities. Left ventricular diastolic parameters are consistent with Grade I diastolic  dysfunction (impaired relaxation).  2. Right ventricular systolic function is normal. The right ventricular size is normal.  3. The mitral valve is normal in structure. Trivial mitral valve regurgitation. No evidence of mitral stenosis.  4. The aortic valve is normal in structure. Aortic valve regurgitation is trivial. No  aortic stenosis is present.  5. The inferior vena cava is normal in size with greater than 50% respiratory variability, suggesting right atrial pressure of 3 mmHg.         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  Lab Results  Component Value Date   CREATININE 1.80 (H) 10/28/2022   CREATININE 0.85 10/21/2022   CREATININE 0.83 10/20/2022   Lab Results  Component Value Date   NA 135 10/28/2022   CL 111 10/28/2022   K 2.7  (LL) 10/28/2022   CO2 15 (L) 10/28/2022   BUN 16 10/28/2022   CREATININE 1.80 (H) 10/28/2022   GFRNONAA 37 (L) 10/28/2022   CALCIUM 7.3 (L) 10/28/2022   PHOS 3.3 10/21/2022   ALBUMIN 3.7 10/28/2022   GLUCOSE 161 (H) 10/28/2022       Chronic anemia - baseline hg Hemoglobin & Hematocrit  Recent Labs    10/20/22 0608 10/21/22 0554 10/28/22 1527  HGB 8.2* 8.5* 9.4*   Iron/TIBC/Ferritin/ %Sat    Component Value Date/Time   IRON 37 (L) 04/02/2022 0514   TIBC 251 04/02/2022 0514   FERRITIN 165 04/02/2022 0514   IRONPCTSAT 15 (L) 04/02/2022 0514     Cancer: Multiple myeloma multiple meds     While in ER:  Found to have hypokalemia and new AKI Potassium replacement ordered CT abdomen unchanged from prior    Lab Orders         Lipase, blood         Comprehensive metabolic panel         CBC         Urinalysis, Routine w reflex microscopic -Urine, Clean Catch         Magnesium       CTabd/pelvis - Persistent diffuse dilatation of the colon with multiple air-levels, similar to previous CT from 3 months ago large right inguinal hernia,     Following Medications were ordered in ER: Medications  potassium chloride 10 mEq in 100 mL IVPB (10 mEq Intravenous New Bag/Given 10/28/22 1750)  sodium chloride 0.9 % bolus 1,000 mL (1,000 mLs Intravenous New Bag/Given 10/28/22 1748)  iohexol (OMNIPAQUE) 300 MG/ML solution 80 mL (80 mLs Intravenous Contrast Given 10/28/22 1655)     ED Triage Vitals  Encounter Vitals Group     BP 10/28/22 1452 104/68     Systolic BP Percentile --      Diastolic BP Percentile --      Pulse Rate 10/28/22 1452 67     Resp 10/28/22 1452 16     Temp 10/28/22 1452 97.7 F (36.5 C)     Temp Source 10/28/22 1452 Oral     SpO2 10/28/22 1452 99 %     Weight 10/28/22 1454 177 lb 14.6 oz (80.7 kg)     Height 10/28/22 1454 5\' 9"  (1.753 m)     Head Circumference --      Peak Flow --      Pain Score 10/28/22 1453 3     Pain Loc --      Pain Education --       Exclude from Growth Chart --   WUJW(11)@     _________________________________________ Significant initial  Findings: Abnormal Labs Reviewed  COMPREHENSIVE METABOLIC PANEL - Abnormal; Notable for the following components:      Result  Value   Potassium 2.7 (*)    CO2 15 (*)    Glucose, Bld 161 (*)    Creatinine, Ser 1.80 (*)    Calcium 7.3 (*)    GFR, Estimated 37 (*)    All other components within normal limits  CBC - Abnormal; Notable for the following components:   RBC 3.20 (*)    Hemoglobin 9.4 (*)    HCT 29.0 (*)    RDW 16.9 (*)    Platelets 406 (*)    All other components within normal limits      _________________________ Troponin ordered  ECG: Ordered Personally reviewed and interpreted by me showing: HR : 86 Rhythm:   A.fib. vs junctional  no evidence of ischemic changes QTC 650  BNP (last 3 results) Recent Labs    07/29/22 0835 07/30/22 2304 08/09/22 1713  BNP 145.9* 357.6* 671.3*    The recent clinical data is shown below. Vitals:   10/28/22 1452 10/28/22 1452 10/28/22 1454 10/28/22 1745  BP:  104/68  117/78  Pulse:  67  87  Resp:  16  15  Temp:  97.7 F (36.5 C)    TempSrc:  Oral    SpO2: 99% 100%  100%  Weight:   80.7 kg   Height:   5\' 9"  (1.753 m)     WBC     Component Value Date/Time   WBC 4.9 10/28/2022 1527   LYMPHSABS 1.1 10/20/2022 0608   MONOABS 0.7 10/20/2022 0608   EOSABS 0.5 10/20/2022 0608   BASOSABS 0.0 10/20/2022 0608     UA not ordered   Urine analysis:    Component Value Date/Time   COLORURINE YELLOW 10/08/2022 2033   APPEARANCEUR CLEAR 10/08/2022 2033   LABSPEC 1.006 10/08/2022 2033   PHURINE 6.0 10/08/2022 2033   GLUCOSEU NEGATIVE 10/08/2022 2033   HGBUR NEGATIVE 10/08/2022 2033   BILIRUBINUR NEGATIVE 10/08/2022 2033   KETONESUR NEGATIVE 10/08/2022 2033   PROTEINUR NEGATIVE 10/08/2022 2033   NITRITE NEGATIVE 10/08/2022 2033   LEUKOCYTESUR NEGATIVE 10/08/2022 2033    Results for orders placed or  performed during the hospital encounter of 10/08/22  Gastrointestinal Panel by PCR , Stool     Status: None   Collection Time: 10/09/22  2:10 PM   Specimen: Stool  Result Value Ref Range Status   Campylobacter species NOT DETECTED NOT DETECTED Final   Plesimonas shigelloides NOT DETECTED NOT DETECTED Final   Salmonella species NOT DETECTED NOT DETECTED Final   Yersinia enterocolitica NOT DETECTED NOT DETECTED Final   Vibrio species NOT DETECTED NOT DETECTED Final   Vibrio cholerae NOT DETECTED NOT DETECTED Final   Enteroaggregative E coli (EAEC) NOT DETECTED NOT DETECTED Final   Enteropathogenic E coli (EPEC) NOT DETECTED NOT DETECTED Final   Enterotoxigenic E coli (ETEC) NOT DETECTED NOT DETECTED Final   Shiga like toxin producing E coli (STEC) NOT DETECTED NOT DETECTED Final   Shigella/Enteroinvasive E coli (EIEC) NOT DETECTED NOT DETECTED Final   Cryptosporidium NOT DETECTED NOT DETECTED Final   Cyclospora cayetanensis NOT DETECTED NOT DETECTED Final   Entamoeba histolytica NOT DETECTED NOT DETECTED Final   Giardia lamblia NOT DETECTED NOT DETECTED Final   Adenovirus F40/41 NOT DETECTED NOT DETECTED Final   Astrovirus NOT DETECTED NOT DETECTED Final   Norovirus GI/GII NOT DETECTED NOT DETECTED Final   Rotavirus A NOT DETECTED NOT DETECTED Final   Sapovirus (I, II, IV, and V) NOT DETECTED NOT DETECTED Final    Comment: Performed at [facility], [address] [place], [place] [address]  C Difficile Quick Screen w PCR reflex     Status: None   Collection Time: 10/09/22  2:10 PM   Specimen: STOOL  Result Value Ref Range Status   C Diff antigen NEGATIVE NEGATIVE Final   C Diff toxin NEGATIVE NEGATIVE Final   C Diff interpretation No C. difficile detected.  Final    Comment: Performed at [facility], [address], [place], [place] [address]  Calprotectin, Fecal     Status: None   Collection Time: 10/10/22  5:15 PM   Specimen: Stool  Result Value  Ref Range Status   Calprotectin, Fecal 12 0 - 120 ug/g Final    Comment: (NOTE) Concentration     Interpretation   Follow-Up < 5 - 50 ug/g     Normal           None >50 -120 ug/g     Borderline       Re-evaluate in 4-6 weeks    >120 ug/g     Abnormal         Repeat as clinically                                   indicated Performed At: [facility] [address] [place], [place] [id number] Jolene Schimke MD [id number]     __________________________________________________________ Recent Labs  Lab 10/28/22 1527  NA 135  K 2.7*  CO2 15*  GLUCOSE 161*  BUN 16  CREATININE 1.80*  CALCIUM 7.3*    Cr   Up from baseline see below Lab Results  Component Value Date   CREATININE 1.80 (H) 10/28/2022   CREATININE 0.85 10/21/2022   CREATININE 0.83 10/20/2022    Recent Labs  Lab 10/28/22 1527  AST 26  ALT 13  ALKPHOS 100  BILITOT 0.7  PROT 6.6  ALBUMIN 3.7   Lab Results  Component Value Date   CALCIUM 7.3 (L) 10/28/2022   PHOS 3.3 10/21/2022  Plt: Lab Results  Component Value Date   PLT 406 (H) 10/28/2022       Recent Labs  Lab 10/28/22 1527  WBC 4.9  HGB 9.4*  HCT 29.0*  MCV 90.6  PLT 406*    HG/HCT  stable,     Component Value Date/Time   HGB 9.4 (L) 10/28/2022 1527   HGB 8.9 (L) 10/08/2022 1349   HGB 9.1 (L) 10/01/2022 1143   HCT 29.0 (L) 10/28/2022 1527   HCT 28.6 (L) 10/01/2022 1143   MCV 90.6 10/28/2022 1527   MCV 90 10/01/2022 1143   Recent Labs  Lab 10/28/22 1527  LIPASE 24   ________________________________________ Hospitalist was called for admission for  Hypokalemia, AKI   The following Work up has been ordered so far:  Orders Placed This Encounter  Procedures   CT ABDOMEN PELVIS W CONTRAST   Lipase, blood   Comprehensive metabolic panel   CBC   Urinalysis, Routine w reflex microscopic -Urine, Clean Catch   Magnesium   Consult to hospitalist   EKG 12-Lead     OTHER Significant initial  Findings:  labs  showing:     DM  labs:  HbA1C: Recent Labs    04/13/22 1101  HGBA1C 6.0*       CBG (last 3)  No results for input(s): "GLUCAP" in the last 72 hours.  Cultures:    Component Value Date/Time   SDES  07/29/2022 3810    BLOOD RIGHT ANTECUBITAL Performed at [facility], [address], [place], [place] [address]    SPECREQUEST  07/29/2022 [phone]    BOTTLES DRAWN AEROBIC AND ANAEROBIC Blood Culture adequate volume Performed at [facility], [address] [place], [place] [address]    CULT  07/29/2022 [phone]    NO GROWTH 5 DAYS Performed at [facility], [address] [place], [place] [address]    REPTSTATUS 08/03/2022 FINAL 07/29/2022 3154     Radiological Exams on Admission: CT ABDOMEN PELVIS W CONTRAST  Result Date: 10/28/2022 CLINICAL DATA:  Abdominal pain with nausea and vomiting for few days. Symptoms worse today. History of small-bowel obstruction. EXAM: CT ABDOMEN AND PELVIS WITH CONTRAST TECHNIQUE: Multidetector CT imaging of the abdomen and pelvis was performed using the standard protocol following bolus administration of intravenous contrast. RADIATION DOSE REDUCTION: This exam was performed according to the departmental dose-optimization program which includes automated exposure control, adjustment of the mA and/or kV according to patient size and/or use of iterative reconstruction technique. CONTRAST:  80mL OMNIPAQUE IOHEXOL 300 MG/ML  SOLN COMPARISON:  Abdominopelvic CT 07/29/2022 and 04/14/2022. Radiographs 10/13/2022 and 08/04/2022. FINDINGS: Lower chest: Mildly increased subsegmental atelectasis at the left lung base associated with asymmetric elevation of the left hemidiaphragm. No confluent airspace disease, basilar pneumothorax or significant pleural effusion. There is atherosclerosis of the aorta and coronary arteries. Hepatobiliary: The liver is normal in density without suspicious focal abnormality. Scattered  hepatic cysts are unchanged. The gallbladder is contracted and not well visualized, but without evidence of acute abnormality. No significant biliary dilatation. Pancreas: Diffusely atrophied. No focal abnormality, ductal dilatation or surrounding inflammation. Spleen: Normal in size without focal abnormality. Adrenals/Urinary Tract: Both adrenal glands appear normal. No evidence of urinary tract calculus, suspicious renal lesion or hydronephrosis. Bilateral renal cysts are grossly stable for which no follow-up imaging is recommended. A portion of the bladder extends into the right inguinal hernia, less than before, and shows no incarceration. The bladder otherwise appears unremarkable. Stomach/Bowel: No enteric contrast administered. The stomach appears unremarkable for its degree of distention. No small bowel distension, wall thickening or surrounding inflammation identified. Again demonstrated is diffuse dilatation of the colon with multiple air-levels, similar to previous CT from 3 months ago. The right colon measures up to 8.7 cm in diameter. No colonic wall thickening is identified. A large right inguinal hernia is again noted, now containing portions of the cecum, but no significant residual small bowel. Separate moderate size left inguinal hernia contains portions of the sigmoid colon, similar to previous study. There are sigmoid colon diverticular changes. The distal sigmoid colon and rectum are relatively decompressed. Vascular/Lymphatic: There are no enlarged abdominal or pelvic lymph nodes. Aortic and branch vessel atherosclerosis without evidence of aneurysm or large vessel occlusion. The portal, superior mesenteric and splenic veins are patent. Reproductive: Stable mild enlargement of the prostate gland. 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 As above, the cecum now extends into the right inguinal hernia. Musculoskeletal: No acute or  significant osseous findings. Previous bilateral femoral ORIF. Mild multilevel spondylosis. Unless specific follow-up recommendations are mentioned in the findings or impression sections, no imaging follow-up of any mentioned incidental findings is recommended. IMPRESSION: 1. Persistent diffuse dilatation of the colon with multiple air-levels, similar to previous CT from 3 months ago, with decompression of distal  sigmoid colon and rectum distal to the chronic sigmoid colon containing left inguinal hernia. Correlate clinically for incarceration and resulting large bowel obstruction. 2. Unchanged size of large right inguinal hernia, now containing a portion of the cecum. No evidence of small-bowel obstruction proximal to this hernia. 3. No evidence of bowel perforation, abscess or ascites. 4. Mildly increased subsegmental atelectasis at the left lung base associated with asymmetric elevation of the left hemidiaphragm. 5.  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 Electronically Signed   By: Carey Bullocks M.D.   On: 10/28/2022 17:49   _______________________________________________________________________________________________________ Latest  Blood pressure 117/78, pulse 87, temperature 97.7 F (36.5 C), temperature source Oral, resp. rate 15, height 5\' 9"  (1.753 m), weight 80.7 kg, SpO2 100%.   Vitals  labs and radiology finding personally reviewed  Review of Systems:    Pertinent positives include: fatigue,  Constitutional:  No weight loss, night sweats, Fevers, chills,  weight loss  HEENT:  No headaches, Difficulty swallowing,Tooth/dental problems,Sore throat,  No sneezing, itching, ear ache, nasal congestion, post nasal drip,  Cardio-vascular:  No chest pain, Orthopnea, PND, anasarca, dizziness, palpitations.no Bilateral lower extremity swelling  GI:  No heartburn, indigestion, abdominal pain, nausea, vomiting, diarrhea, change in bowel habits, loss of appetite, melena, blood in stool,  hematemesis Resp:  no shortness of breath at rest. No dyspnea on exertion, No excess mucus, no productive cough, No non-productive cough, No coughing up of blood.No change in color of mucus.No wheezing. Skin:  no rash or lesions. No jaundice GU:  no dysuria, change in color of urine, no urgency or frequency. No straining to urinate.  No flank pain.  Musculoskeletal:  No joint pain or no joint swelling. No decreased range of motion. No back pain.  Psych:  No change in mood or affect. No depression or anxiety. No memory loss.  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   Pre-diabetes       Past Surgical History:  Procedure Laterality Date   BIOPSY  08/09/2022   Procedure: BIOPSY;  Surgeon: Shellia Cleverly, DO;  Location: [facility];  Service: Gastroenterology;;   BIOPSY  10/20/2022   Procedure: BIOPSY;  Surgeon: Iva Boop, MD;  Location: [facility];  Service: Gastroenterology;;   COLONOSCOPY     ESOPHAGOGASTRODUODENOSCOPY N/A 10/20/2022   Procedure: ESOPHAGOGASTRODUODENOSCOPY (EGD);  Surgeon: Iva Boop, MD;  Location: [facility];  Service: Gastroenterology;  Laterality: N/A;   FLEXIBLE SIGMOIDOSCOPY N/A 08/09/2022   Procedure: FLEXIBLE SIGMOIDOSCOPY;  Surgeon: Shellia Cleverly, DO;  Location: [facility];   Service: Gastroenterology;  Laterality: N/A;  propofol   FLEXIBLE SIGMOIDOSCOPY N/A 10/20/2022   Procedure: FLEXIBLE SIGMOIDOSCOPY;  Surgeon: Iva Boop, MD;  Location: [facility];  Service: Gastroenterology;  Laterality: N/A;   INTRAMEDULLARY (IM) NAIL INTERTROCHANTERIC Left 06/11/2020   Procedure: INTRAMEDULLARY (IM) NAIL INTERTROCHANTRIC;  Surgeon: Roby Lofts, MD;  Location: [facility];  Service: Orthopedics;  Laterality: Left;   INTRAMEDULLARY (IM) NAIL INTERTROCHANTERIC  Right 04/17/2022   Procedure: INTRAMEDULLARY (IM) NAIL INTERTROCHANTERIC;  Surgeon: London Sheer, MD;  Location: [facility];  Service: Orthopedics;  Laterality: Right;   KNEE ARTHROSCOPY Bilateral    POLYPECTOMY      Social History:  Ambulatory wheelchair bound   reports that he has never smoked. He has never used smokeless tobacco. He reports that he does not drink alcohol and does not use drugs. Family History:  Family History  Problem Relation Age of Onset   Colon cancer Neg Hx    ______________________________________________________________________________________________ Allergies: No Known Allergies   Prior to Admission medications   Medication Sig Start Date End Date Taking? Authorizing Provider  acetaminophen (TYLENOL) 500 MG tablet Take 1,000 mg by mouth every 8 (eight) hours as needed for moderate pain.    [provider]  apixaban (ELIQUIS) 5 MG TABS tablet Take 1 tablet (5 mg total) by mouth 2 (two) times daily. 08/16/22   Azucena Fallen, MD  cholestyramine light (PREVALITE) 4 g packet Take 1 packet (4 g total) by mouth 3 (three) times daily. 10/22/22   Willeen Niece, MD  ferrous sulfate 325 (65 FE) MG tablet Take 1 tablet (325 mg total) by mouth 2 (two) times daily with a meal. 06/01/22   Rodolph Bong, MD  lipase/protease/amylase (CREON) 36000 UNITS CPEP capsule Take 1 capsule (36,000 Units total) by mouth 3 (three) times daily before meals. 10/22/22   Willeen Niece, MD   loperamide (IMODIUM) 2 MG capsule Take 1 capsule (2 mg total) by mouth 3 (three) times daily. 10/22/22   Willeen Niece, MD  mirtazapine (REMERON) 7.5 MG tablet Take 7.5 mg by mouth at bedtime. 10/01/22   [provider]  pantoprazole (PROTONIX) 40 MG tablet Take 1 tablet (40 mg total) by mouth 2 (two) times daily. 08/16/22   Azucena Fallen, MD  psyllium (HYDROCIL/METAMUCIL) 95 % PACK Take 1 packet by mouth daily. 10/23/22   Willeen Niece, MD  Vitamin D, Ergocalciferol, (DRISDOL) 1.25 MG (50000 UNIT) CAPS capsule Take 1 capsule (50,000 Units total) by mouth once a week. Patient taking differently: Take 50,000 Units by mouth once a week. tuesday 07/09/22   Johney Maine, MD    ___________________________________________________________________________________________________ Physical Exam:    10/28/2022    5:45 PM 10/28/2022    2:54 PM 10/28/2022    2:52 PM  Vitals with BMI  Height  5\' 9"    Weight  177 lbs 15 oz   BMI  26.26   Systolic 117  104  Diastolic 78  68  Pulse 87  67     1. General:  in No  Acute distress   Chronically ill   -appearing 2. Psychological: Alert and   Oriented 3. Head/ENT:    Dry Mucous Membranes                          Head Non traumatic, neck supple                           Poor Dentition 4. SKIN:  decreased Skin turgor,  Skin clean Dry and intact no rash    5. Heart: Regular rate and rhythm no  Murmur, no Rub or gallop 6. Lungs: , no wheezes or crackles   7. Abdomen: Soft,  non-tender,   distended   obese  bowel sounds present 8. Lower extremities: no clubbing, cyanosis,  trace edema 9. Neurologically Grossly intact,  moving all 4 extremities equally  10. MSK: Normal range of motion    Chart has been reviewed  ______________________________________________________________________________________________  Assessment/Plan 82 y.o. 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)  Ogilvie's syndrome  Acute renal failure superimposed on stage 3a chronic kidney disease (HCC)  Prolonged QT interval  Chronic diastolic CHF (congestive heart failure) (HCC)  A-fib (HCC)  Diarrhea Chronic recurrent Has had extensive workup with GI if persists or becomes severe would recommend a GI consult will send a message to [facility] at night  Dehydration Will rehydrate and follow fluid status orthostatics prior to discharge  Syncope In the setting of hypotension secondary to hypovolemia Rehydrate monitor blood pressure Observe on progressive   completion obtain EKG and troponin Last echo was in May 2024 and showed grade 1 diastolic dysfunction  Essential hypertension Allow permissive hypertension for tonight  Hypokalemia Recurrent Replace and recheck Check other electrolytes and replace as needed  Chronic kidney disease, stage 3a (HCC) Creatinine at baseline now down to 0.8 this suspect some of her creatinine decrease in the setting of muscle atrophy  -chronic avoid nephrotoxic medications such as NSAIDs, Vanco Zosyn combo,  avoid hypotension, continue to follow renal function   Multiple myeloma not having achieved remission (HCC) Followed by oncology  Normocytic anemia Obtain anemia panel  Transfuse for Hg <7 , rapidly dropping or  if symptomatic   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  Acute renal failure superimposed on stage 3a chronic kidney  disease (HCC) Rehydrate obtain electrolytes and continue to follow  Prolonged QT interval - will monitor on tele avoid QT prolonging medications, rehydrate correct electrolytes   Chronic diastolic CHF (congestive heart failure) (HCC) - currently appears to be slightly on the dry side, hold home diuretics for tonight and restart when appears euvolemic, carefuly follow fluid status and Cr   A-fib (HCC) ECG unclear if true A.fib, pt already on eliquis will continue Rate controlled Correct electrolytes Repeat echo Other plan as per orders.  DVT prophylaxis:  eliquis   Code Status:  DNR/DNI   as per patient   I had personally discussed CODE STATUS with patient   ACP   none    Family Communication:   Family not at  Bedside  plan of care was discussed on the phone with  Wife,  Diet  heart healthy   Disposition Plan:          To home once workup is complete and patient is stable   Following barriers for discharge:                                                       Electrolytes corrected  Will need consultants to evaluate patient prior to discharge       Consult Orders  (From admission, onward)           Start     Ordered   10/28/22 1803  Consult to hospitalist  Once       Provider:  (Not yet assigned)  Question Answer Comment  Place call to: [facility]   Reason for Consult Admit      10/28/22 1802                              Would benefit from PT/OT eval prior to DC  Ordered                                     Transition of care consulted                   Nutrition    consulted                  Wound care  consulted                   Palliative care    consulted                                    Consults called: sent msg to [facility] Dr. Zola Button   Admission status:  ED Disposition     ED Disposition  Admit   Condition  --   Comment  Hospital Area: [facility]  [[id number]]  Level of Care: Progressive [102]  Admit to Progressive based on following criteria: NEPHROLOGY stable condition requiring close monitoring for AKI, requiring Hemodialysis or Peritoneal Dialysis either from expected electrolyte imbalance, acidosis, or fluid overload that can be managed by NIPPV or high flow oxygen.  May place patient in observation at [facility] or [facility] if equivalent level of care is available:: No  Covid Evaluation: Asymptomatic - no recent exposure (last 10 days) testing not required  Diagnosis: Dehydration [276.51.ICD-9-CM]  Admitting Physician: Therisa Doyne [[id number]]  Attending Physician: Therisa Doyne [[id number]]           Obs     Level of care         progressive     tele indefinitely please discontinue once patient no longer qualifies COVID-19 Labs    Carneshia Raker 10/28/2022, 7:50 PM    [facility]     after 2 AM please page floor coverage PA If 7AM-7PM, please contact the day team taking care of the patient using [email or web address]

## 2022-10-29 ENCOUNTER — Observation Stay (HOSPITAL_COMMUNITY): Payer: Medicare PPO

## 2022-10-29 DIAGNOSIS — N1831 Chronic kidney disease, stage 3a: Secondary | ICD-10-CM | POA: Diagnosis present

## 2022-10-29 DIAGNOSIS — N179 Acute kidney failure, unspecified: Secondary | ICD-10-CM | POA: Diagnosis present

## 2022-10-29 DIAGNOSIS — E861 Hypovolemia: Secondary | ICD-10-CM | POA: Diagnosis present

## 2022-10-29 DIAGNOSIS — I4891 Unspecified atrial fibrillation: Secondary | ICD-10-CM | POA: Diagnosis present

## 2022-10-29 DIAGNOSIS — R55 Syncope and collapse: Secondary | ICD-10-CM | POA: Diagnosis not present

## 2022-10-29 DIAGNOSIS — R197 Diarrhea, unspecified: Secondary | ICD-10-CM | POA: Diagnosis not present

## 2022-10-29 DIAGNOSIS — K529 Noninfective gastroenteritis and colitis, unspecified: Secondary | ICD-10-CM | POA: Diagnosis present

## 2022-10-29 DIAGNOSIS — I130 Hypertensive heart and chronic kidney disease with heart failure and stage 1 through stage 4 chronic kidney disease, or unspecified chronic kidney disease: Secondary | ICD-10-CM | POA: Diagnosis present

## 2022-10-29 DIAGNOSIS — Z79899 Other long term (current) drug therapy: Secondary | ICD-10-CM | POA: Diagnosis not present

## 2022-10-29 DIAGNOSIS — M84551D Pathological fracture in neoplastic disease, right femur, subsequent encounter for fracture with routine healing: Secondary | ICD-10-CM | POA: Diagnosis present

## 2022-10-29 DIAGNOSIS — E876 Hypokalemia: Secondary | ICD-10-CM | POA: Diagnosis present

## 2022-10-29 DIAGNOSIS — I5032 Chronic diastolic (congestive) heart failure: Secondary | ICD-10-CM | POA: Diagnosis present

## 2022-10-29 DIAGNOSIS — R627 Adult failure to thrive: Secondary | ICD-10-CM

## 2022-10-29 DIAGNOSIS — K8689 Other specified diseases of pancreas: Secondary | ICD-10-CM | POA: Diagnosis present

## 2022-10-29 DIAGNOSIS — Z7189 Other specified counseling: Secondary | ICD-10-CM | POA: Diagnosis not present

## 2022-10-29 DIAGNOSIS — E43 Unspecified severe protein-calorie malnutrition: Secondary | ICD-10-CM | POA: Diagnosis present

## 2022-10-29 DIAGNOSIS — K5981 Ogilvie syndrome: Secondary | ICD-10-CM | POA: Diagnosis present

## 2022-10-29 DIAGNOSIS — K4090 Unilateral inguinal hernia, without obstruction or gangrene, not specified as recurrent: Secondary | ICD-10-CM | POA: Diagnosis present

## 2022-10-29 DIAGNOSIS — C679 Malignant neoplasm of bladder, unspecified: Secondary | ICD-10-CM | POA: Diagnosis present

## 2022-10-29 DIAGNOSIS — C7951 Secondary malignant neoplasm of bone: Secondary | ICD-10-CM | POA: Diagnosis present

## 2022-10-29 DIAGNOSIS — I9589 Other hypotension: Secondary | ICD-10-CM | POA: Diagnosis present

## 2022-10-29 DIAGNOSIS — D630 Anemia in neoplastic disease: Secondary | ICD-10-CM | POA: Diagnosis present

## 2022-10-29 DIAGNOSIS — E8581 Light chain (AL) amyloidosis: Secondary | ICD-10-CM | POA: Diagnosis present

## 2022-10-29 DIAGNOSIS — R195 Other fecal abnormalities: Secondary | ICD-10-CM | POA: Diagnosis not present

## 2022-10-29 DIAGNOSIS — E860 Dehydration: Secondary | ICD-10-CM | POA: Diagnosis present

## 2022-10-29 DIAGNOSIS — Z66 Do not resuscitate: Secondary | ICD-10-CM | POA: Diagnosis present

## 2022-10-29 DIAGNOSIS — K5939 Other megacolon: Secondary | ICD-10-CM | POA: Diagnosis present

## 2022-10-29 DIAGNOSIS — C9000 Multiple myeloma not having achieved remission: Secondary | ICD-10-CM | POA: Diagnosis present

## 2022-10-29 LAB — ECHOCARDIOGRAM COMPLETE
AR max vel: 2.37 cm2
AV Area VTI: 2.27 cm2
AV Area mean vel: 2.18 cm2
AV Mean grad: 3 mmHg
AV Peak grad: 6 mmHg
Ao pk vel: 1.22 m/s
Area-P 1/2: 4.71 cm2
Height: 69 in
P 1/2 time: 485 ms
S' Lateral: 3.3 cm
Weight: 2846.58 [oz_av]

## 2022-10-29 LAB — COMPREHENSIVE METABOLIC PANEL
ALT: 11 U/L (ref 0–44)
AST: 20 U/L (ref 15–41)
Albumin: 3.1 g/dL — ABNORMAL LOW (ref 3.5–5.0)
Alkaline Phosphatase: 75 U/L (ref 38–126)
Anion gap: 6 (ref 5–15)
BUN: 14 mg/dL (ref 8–23)
CO2: 15 mmol/L — ABNORMAL LOW (ref 22–32)
Calcium: 7 mg/dL — ABNORMAL LOW (ref 8.9–10.3)
Chloride: 116 mmol/L — ABNORMAL HIGH (ref 98–111)
Creatinine, Ser: 1.39 mg/dL — ABNORMAL HIGH (ref 0.61–1.24)
GFR, Estimated: 51 mL/min — ABNORMAL LOW (ref >60)
Glucose, Bld: 133 mg/dL — ABNORMAL HIGH (ref 70–99)
Potassium: 3 mmol/L — ABNORMAL LOW (ref 3.5–5.1)
Sodium: 137 mmol/L (ref 135–145)
Total Bilirubin: 0.4 mg/dL (ref 0.3–1.2)
Total Protein: 5.7 g/dL — ABNORMAL LOW (ref 6.5–8.1)

## 2022-10-29 LAB — CBC
HCT: 25.1 % — ABNORMAL LOW (ref 39.0–52.0)
Hemoglobin: 8.3 g/dL — ABNORMAL LOW (ref 13.0–17.0)
MCH: 29.7 pg (ref 26.0–34.0)
MCHC: 33.1 g/dL (ref 30.0–36.0)
MCV: 90 fL (ref 80.0–100.0)
Platelets: 370 10*3/uL (ref 150–400)
RBC: 2.79 MIL/uL — ABNORMAL LOW (ref 4.22–5.81)
RDW: 17.1 % — ABNORMAL HIGH (ref 11.5–15.5)
WBC: 5.9 10*3/uL (ref 4.0–10.5)
nRBC: 0 % (ref 0.0–0.2)

## 2022-10-29 LAB — FOLATE: Folate: 7.5 ng/mL (ref >5.9)

## 2022-10-29 LAB — LACTIC ACID, PLASMA
Lactic Acid, Venous: 1.2 mmol/L (ref 0.5–1.9)
Lactic Acid, Venous: 1.8 mmol/L (ref 0.5–1.9)

## 2022-10-29 LAB — BLOOD GAS, VENOUS
Acid-base deficit: 11 mmol/L — ABNORMAL HIGH (ref 0.0–2.0)
Bicarbonate: 15.3 mmol/L — ABNORMAL LOW (ref 20.0–28.0)
O2 Saturation: 49.8 %
Patient temperature: 36.6
pCO2, Ven: 34 mmHg — ABNORMAL LOW (ref 44–60)
pH, Ven: 7.26 (ref 7.25–7.43)
pO2, Ven: <31 mmHg — CL (ref 32–45)

## 2022-10-29 LAB — OSMOLALITY, URINE: Osmolality, Ur: 168 mosm/kg — ABNORMAL LOW (ref 300–900)

## 2022-10-29 LAB — OSMOLALITY: Osmolality: 296 mosm/kg — ABNORMAL HIGH (ref 275–295)

## 2022-10-29 LAB — VITAMIN B12: Vitamin B-12: 363 pg/mL (ref 180–914)

## 2022-10-29 LAB — MAGNESIUM: Magnesium: 1.5 mg/dL — ABNORMAL LOW (ref 1.7–2.4)

## 2022-10-29 LAB — PREALBUMIN: Prealbumin: 13 mg/dL — ABNORMAL LOW (ref 18–38)

## 2022-10-29 LAB — PHOSPHORUS: Phosphorus: 1.9 mg/dL — ABNORMAL LOW (ref 2.5–4.6)

## 2022-10-29 MED ORDER — CHOLESTYRAMINE LIGHT 4 G PO PACK
4.0000 g | PACK | Freq: Three times a day (TID) | ORAL | Status: DC
  Administered 2022-10-29 – 2022-10-30 (×3): 4 g via ORAL
  Filled 2022-10-29 (×3): qty 1

## 2022-10-29 MED ORDER — VITAMIN C 500 MG PO TABS
500.0000 mg | ORAL_TABLET | Freq: Two times a day (BID) | ORAL | Status: DC
  Administered 2022-10-29 – 2022-10-30 (×3): 500 mg via ORAL
  Filled 2022-10-29 (×3): qty 1

## 2022-10-29 MED ORDER — VITAMIN B-12 1000 MCG PO TABS
1000.0000 ug | ORAL_TABLET | Freq: Every day | ORAL | Status: DC
  Administered 2022-10-29 – 2022-10-30 (×2): 1000 ug via ORAL
  Filled 2022-10-29 (×2): qty 1

## 2022-10-29 MED ORDER — ZINC SULFATE 220 (50 ZN) MG PO CAPS
220.0000 mg | ORAL_CAPSULE | Freq: Every day | ORAL | Status: DC
  Administered 2022-10-29 – 2022-10-30 (×2): 220 mg via ORAL
  Filled 2022-10-29 (×2): qty 1

## 2022-10-29 MED ORDER — ENSURE ENLIVE PO LIQD
237.0000 mL | Freq: Two times a day (BID) | ORAL | Status: DC
  Administered 2022-10-29 – 2022-10-30 (×3): 237 mL via ORAL

## 2022-10-29 MED ORDER — POTASSIUM PHOSPHATES 15 MMOLE/5ML IV SOLN
30.0000 mmol | Freq: Once | INTRAVENOUS | Status: AC
  Administered 2022-10-29: 30 mmol via INTRAVENOUS
  Filled 2022-10-29: qty 10

## 2022-10-29 MED ORDER — POTASSIUM CHLORIDE CRYS ER 20 MEQ PO TBCR
40.0000 meq | EXTENDED_RELEASE_TABLET | ORAL | Status: DC
Start: 2022-10-29 — End: 2022-10-29

## 2022-10-29 MED ORDER — ADULT MULTIVITAMIN W/MINERALS CH
1.0000 | ORAL_TABLET | Freq: Every day | ORAL | Status: DC
  Administered 2022-10-29 – 2022-10-30 (×2): 1 via ORAL
  Filled 2022-10-29 (×2): qty 1

## 2022-10-29 MED ORDER — ONDANSETRON HCL 4 MG/2ML IJ SOLN
4.0000 mg | Freq: Four times a day (QID) | INTRAMUSCULAR | Status: DC | PRN
  Administered 2022-10-29: 4 mg via INTRAVENOUS
  Filled 2022-10-29: qty 2

## 2022-10-29 MED ORDER — SODIUM BICARBONATE 650 MG PO TABS
650.0000 mg | ORAL_TABLET | Freq: Three times a day (TID) | ORAL | Status: DC
  Administered 2022-10-29 – 2022-10-30 (×4): 650 mg via ORAL
  Filled 2022-10-29 (×4): qty 1

## 2022-10-29 NOTE — Evaluation (Signed)
Physical Therapy Evaluation Patient Details Name: Frank Hernandez MRN: [id number] DOB: 08-16-1940 Today's Date: 10/29/2022  History of Present Illness  82 y.o. 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  Presented with dehydration recurrent diarrhea nausea vomiting  Patient with recurrent admissions (most recently 8/9-8/23/24) for recurrent AKI and hypokalemia in the setting of chronic diarrhea.  Clinical Impression  Pt admitted with above diagnosis. Pt ambulated 6' with RW, distance limited by fatigue, no loss of balance. Pt reports he has 24 hour care from his wife at home. HHPT recommended.  Pt currently with functional limitations due to the deficits listed below (see PT Problem List). Pt will benefit from acute skilled PT to increase their independence and safety with mobility to allow discharge.           If plan is discharge home, recommend the following: A little help with walking and/or transfers;A little help with bathing/dressing/bathroom;Direct supervision/assist for medications management;Assistance with cooking/housework;Assist for transportation;Help with stairs or ramp for entrance   Can travel by private vehicle   No    Equipment Recommendations None recommended by PT  Recommendations for Other Services       Functional Status Assessment Patient has had a recent decline in their functional status and demonstrates the ability to make significant improvements in function in a reasonable and predictable amount of time.     Precautions / Restrictions Precautions Precautions: Fall Precaution Comments: pt denies falls in past 6 months Restrictions Weight Bearing Restrictions: No      Mobility  Bed Mobility Overal bed mobility: Needs Assistance Bed Mobility: Supine to  Sit     Supine to sit: Supervision, HOB elevated, Used rails          Transfers Overall transfer level: Needs assistance Equipment used: Rolling walker (2 wheels) Transfers: Sit to/from Stand Sit to Stand: Contact guard assist, From elevated surface           General transfer comment: VCs hand placement    Ambulation/Gait Ambulation/Gait assistance: Contact guard assist Gait Distance (Feet): 6 Feet Assistive device: Rolling walker (2 wheels) Gait Pattern/deviations: Step-through pattern, Decreased stride length Gait velocity: decreased     General Gait Details: distance limited by fatigue, no loss of balance  Stairs            Wheelchair Mobility     Tilt Bed    Modified Rankin (Stroke Patients Only)       Balance Overall balance assessment: Needs assistance Sitting-balance support: No upper extremity supported, Feet supported Sitting balance-Leahy Scale: Good     Standing balance support: Bilateral upper extremity supported, Reliant on assistive device for balance Standing balance-Leahy Scale: Fair Standing balance comment: RW and CGA                             Pertinent Vitals/Pain Pain Assessment Pain Score: 5  Pain Location: R foot Pain Descriptors / Indicators: Sore Pain Intervention(s): Limited activity within patient's tolerance, Monitored during session, Premedicated before session, Repositioned    Home Living Family/patient expects to be discharged to:: Private residence Living Arrangements: Spouse/significant other Available Help at Discharge: Family Type of Home: House Home Access: Ramped entrance       Home Layout: One level Home Equipment: Agricultural consultant (2  wheels);BSC/3in1;Wheelchair - manual Additional Comments: Pt sleeps in his Lift recliner.    Prior Function Prior Level of Function : Needs assist             Mobility Comments: reports having [facility] home services but unable to name specifics,  reports getting into bathroom "when I can" , uses both RW and w/c ADLs Comments: pt reports he is able to bath and perform pericare but requires assist for dressing     Extremity/Trunk Assessment   Upper Extremity Assessment Upper Extremity Assessment: Defer to OT evaluation    Lower Extremity Assessment Lower Extremity Assessment: Overall WFL for tasks assessed    Cervical / Trunk Assessment Cervical / Trunk Assessment: Normal  Communication      Cognition Arousal: Alert Behavior During Therapy: WFL for tasks assessed/performed Overall Cognitive Status: Within Functional Limits for tasks assessed                                          General Comments      Exercises     Assessment/Plan    PT Assessment Patient needs continued PT services  PT Problem List Decreased strength;Decreased activity tolerance;Decreased balance;Decreased mobility       PT Treatment Interventions DME instruction;Gait training;Balance training;Functional mobility training;Therapeutic activities;Therapeutic exercise;Manual techniques;Patient/family education    PT Goals (Current goals can be found in the Care Plan section)  Acute Rehab PT Goals Patient Stated Goal: to get stronger PT Goal Formulation: With patient Time For Goal Achievement: 11/12/22 Potential to Achieve Goals: Good    Frequency Min 1X/week     Co-evaluation               AM-PAC PT "6 Clicks" Mobility  Outcome Measure Help needed turning from your back to your side while in a flat bed without using bedrails?: None Help needed moving from lying on your back to sitting on the side of a flat bed without using bedrails?: A Little Help needed moving to and from a bed to a chair (including a wheelchair)?: A Little Help needed standing up from a chair using your arms (e.g., wheelchair or bedside chair)?: A Little Help needed to walk in hospital room?: A Little Help needed climbing 3-5 steps with a  railing? : A Lot 6 Click Score: 18    End of Session Equipment Utilized During Treatment: Gait belt Activity Tolerance: Patient tolerated treatment well Patient left: with call bell/phone within reach;in chair;with chair alarm set;with nursing/sitter in room Nurse Communication: Mobility status PT Visit Diagnosis: Muscle weakness (generalized) (M62.81);Difficulty in walking, not elsewhere classified (R26.2);Other abnormalities of gait and mobility (R26.89)    Time: 1146-1200 PT Time Calculation (min) (ACUTE ONLY): 14 min   Charges:   PT Evaluation $PT Eval Moderate Complexity: 1 Mod   PT General Charges $$ ACUTE PT VISIT: 1 Visit         Tamala Ser PT 10/29/2022  Acute Rehabilitation Services  Office [phone]

## 2022-10-29 NOTE — Assessment & Plan Note (Signed)
-   extensive workup with GI recently; negative etiology so far; has been maintained on Creon, Imodium, cholestyramine -GI again consulted on admission, appreciate assistanceI - Continuing home regimen as ordered per GI

## 2022-10-29 NOTE — Assessment & Plan Note (Signed)
-   patient has history of CKD3a. Baseline creat ~ 1.2, eGFR~ 48 - patient presents with increase in creat >0.3 mg/dL above baseline or creat increase >1.5x baseline presumed to have occurred within past 7 days PTA - creat 1.8 on admission - baseline creat also starting to fall in setting of muscle wasting  - s/p IVF

## 2022-10-29 NOTE — Assessment & Plan Note (Signed)
-   due to ongoing diarrhea/loose stools and poor intake - repleted

## 2022-10-29 NOTE — Progress Notes (Signed)
Progress Note    Frank Hernandez   NWG:[id number]  DOB: [birth year]  DOA: 10/28/2022     0 PCP: [name] (Inactive)  Initial CC: diarrhea  Hospital Course: Mr. 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 He again presented with poor oral intake at home and ongoing diarrhea.  He was again found to have hypokalemia and AKI on workup. He has had extensive GI workup in the past, see notes. He was started on fluids on admission and admitted for further electrolyte repletion and monitoring.  Interval History:  He continues to have poor intake at home and ongoing diarrhea. Due to some vomiting on top of this, he presented.   Assessment and Plan: * Diarrhea - extensive workup with GI recently; negative etiology so far; has been maintained on Creon, Imodium, cholestyramine -GI again consulted on admission, appreciate assistance -Hold off on further stool testing unless recommended by GI - Continuing home regimen as ordered per GI  Goals of care, counseling/discussion - I discussed with patient bedside.  He is currently not wishing to transition to hospice at this time.  He is clear with intentions which is to continue treatments as able and his hope is to improve in terms of diarrhea and nutritional intake.  With his current malnourished state, he may continue to have further decline but GOC discussions seem clear.  There is also no current symptom management needing to be addressed inpatient but I do agree he needs involvement of palliative care which can be pursued outpatient - for now continuing to reverse inpatient issues as able and will have patient seen outpatient with palliative care in referral  Acute renal failure superimposed on stage 3a chronic kidney disease (HCC) - patient has history of CKD3a. Baseline creat ~ 1.2, eGFR~ 48 - patient presents with  increase in creat >0.3 mg/dL above baseline or creat increase >1.5x baseline presumed to have occurred within past 7 days PTA - creat 1.8 on admission - baseline creat also starting to fall in setting of muscle wasting  - continue IVF and trend    Hypokalemia - due to ongoing diarrhea and poor intake - replete as needed  Multiple myeloma not having achieved remission (HCC) - follows with Dr. Candise Che; last seen 10/08/22 - diagnosed with MM with AL amyloidosis with anemia, renal insufficiency, and lytic bone lesions - last treatment 07/16/22; functional status and electrolyte abnormalities have limited resuming treatment - needs outpatient involvement of palliative care  A-fib (HCC) - Continue Eliquis  Chronic diastolic CHF (congestive heart failure) (HCC) - no s/s exac - will monitor while on fluids -Last echo reviewed from 07/30/2022.  EF 65 to 70%, grade 1 DD, no RWMA  History of pulmonary embolism - Continue Eliquis  Prolonged QT interval - QTc 650 on EKG - Optimizing electrolytes as best as possible  Normocytic anemia - Baseline hemoglobin 8 to 9 g/dL  Essential hypertension - In setting of dehydration and syncope on admission, BP meds on hold  Syncope-resolved as of 10/29/2022 - Reported syncopal event prior to admission with associated lightheadedness.  Associated with low blood pressure   Old records reviewed in assessment of this patient  Antimicrobials:   DVT prophylaxis:  SCDs Start: 10/28/22 2046 apixaban (ELIQUIS) tablet 5 mg   Code Status:  Code Status: Limited: Do not attempt resuscitation (DNR) -DNR-LIMITED -Do Not Intubate/DNI   Mobility  Assessment (Last 72 Hours)     Mobility Assessment     Row Name 10/29/22 1223 10/29/22 0930 10/28/22 2130       Does patient have an order for bedrest or is patient medically unstable -- No - Continue assessment No - Continue assessment     What is the highest level of mobility based on the progressive mobility  assessment? Level 4 (Walks with assist in room) - Balance while marching in place and cannot step forward and back - Complete Level 4 (Walks with assist in room) - Balance while marching in place and cannot step forward and back - Complete Level 3 (Stands with assist) - Balance while standing  and cannot march in place     Is the above level different from baseline mobility prior to current illness? -- Yes - Recommend PT order Yes - Recommend PT order              Barriers to discharge: none Disposition Plan:  Home Status is: Inpt  Objective: Blood pressure 101/67, pulse 73, temperature (!) 97.5 F (36.4 C), temperature source Oral, resp. rate 18, height 5\' 9"  (1.753 m), weight 80.7 kg, SpO2 100%.  Examination:  Physical Exam Constitutional:      General: He is not in acute distress.    Appearance: Normal appearance.  HENT:     Head: Normocephalic and atraumatic.     Mouth/Throat:     Mouth: Mucous membranes are moist.  Eyes:     Extraocular Movements: Extraocular movements intact.  Cardiovascular:     Rate and Rhythm: Normal rate and regular rhythm.  Pulmonary:     Effort: Pulmonary effort is normal. No respiratory distress.     Breath sounds: Normal breath sounds. No wheezing.  Abdominal:     General: Bowel sounds are normal. There is no distension.     Palpations: Abdomen is soft.     Tenderness: There is no abdominal tenderness.  Musculoskeletal:        General: Normal range of motion.     Cervical back: Normal range of motion and neck supple.  Skin:    General: Skin is warm and dry.  Neurological:     General: No focal deficit present.     Mental Status: He is alert.  Psychiatric:        Mood and Affect: Mood normal.        Behavior: Behavior normal.      Consultants:  GI  Procedures:    Data Reviewed: Results for orders placed or performed during the hospital encounter of 10/28/22 (from the past 24 hour(s))  Lipase, blood     Status: None   Collection  Time: 10/28/22  3:27 PM  Result Value Ref Range   Lipase 24 11 - 51 U/L  Comprehensive metabolic panel     Status: Abnormal   Collection Time: 10/28/22  3:27 PM  Result Value Ref Range   Sodium 135 135 - 145 mmol/L   Potassium 2.7 (LL) 3.5 - 5.1 mmol/L   Chloride 111 98 - 111 mmol/L   CO2 15 (L) 22 - 32 mmol/L   Glucose, Bld 161 (H) 70 - 99 mg/dL   BUN 16 8 - 23 mg/dL   Creatinine, Ser 1.61 (H) 0.61 - 1.24 mg/dL   Calcium 7.3 (L) 8.9 - 10.3 mg/dL   Total Protein 6.6 6.5 - 8.1 g/dL   Albumin 3.7 3.5 - 5.0 g/dL   AST 26 15 - 41 U/L  ALT 13 0 - 44 U/L   Alkaline Phosphatase 100 38 - 126 U/L   Total Bilirubin 0.7 0.3 - 1.2 mg/dL   GFR, Estimated 37 (L) >60 mL/min   Anion gap 9 5 - 15  CBC     Status: Abnormal   Collection Time: 10/28/22  3:27 PM  Result Value Ref Range   WBC 4.9 4.0 - 10.5 K/uL   RBC 3.20 (L) 4.22 - 5.81 MIL/uL   Hemoglobin 9.4 (L) 13.0 - 17.0 g/dL   HCT 86.5 (L) 78.4 - 69.6 %   MCV 90.6 80.0 - 100.0 fL   MCH 29.4 26.0 - 34.0 pg   MCHC 32.4 30.0 - 36.0 g/dL   RDW 29.5 (H) 28.4 - 13.2 %   Platelets 406 (H) 150 - 400 K/uL   nRBC 0.0 0.0 - 0.2 %  Urinalysis, Routine w reflex microscopic -Urine, Clean Catch     Status: Abnormal   Collection Time: 10/28/22  6:57 PM  Result Value Ref Range   Color, Urine STRAW (A) YELLOW   APPearance CLEAR CLEAR   Specific Gravity, Urine 1.013 1.005 - 1.030   pH 5.0 5.0 - 8.0   Glucose, UA NEGATIVE NEGATIVE mg/dL   Hgb urine dipstick NEGATIVE NEGATIVE   Bilirubin Urine NEGATIVE NEGATIVE   Ketones, ur NEGATIVE NEGATIVE mg/dL   Protein, ur NEGATIVE NEGATIVE mg/dL   Nitrite NEGATIVE NEGATIVE   Leukocytes,Ua NEGATIVE NEGATIVE  Osmolality, urine     Status: Abnormal   Collection Time: 10/28/22  6:57 PM  Result Value Ref Range   Osmolality, Ur 168 (L) 300 - 900 mOsm/kg  Sodium, urine, random     Status: None   Collection Time: 10/28/22  6:57 PM  Result Value Ref Range   Sodium, Ur 12 mmol/L  Creatinine, urine, random      Status: None   Collection Time: 10/28/22  6:57 PM  Result Value Ref Range   Creatinine, Urine 68 mg/dL  Magnesium     Status: Abnormal   Collection Time: 10/28/22  9:07 PM  Result Value Ref Range   Magnesium 1.6 (L) 1.7 - 2.4 mg/dL  Iron and TIBC     Status: Abnormal   Collection Time: 10/28/22  9:07 PM  Result Value Ref Range   Iron 23 (L) 45 - 182 ug/dL   TIBC 440 (L) 102 - 725 ug/dL   Saturation Ratios 11 (L) 17.9 - 39.5 %   UIBC 194 ug/dL  Ferritin     Status: Abnormal   Collection Time: 10/28/22  9:07 PM  Result Value Ref Range   Ferritin 463 (H) 24 - 336 ng/mL  Reticulocytes     Status: Abnormal   Collection Time: 10/28/22  9:07 PM  Result Value Ref Range   Retic Ct Pct 2.2 0.4 - 3.1 %   RBC. 3.36 (L) 4.22 - 5.81 MIL/uL   Retic Count, Absolute 72.6 19.0 - 186.0 K/uL   Immature Retic Fract 20.6 (H) 2.3 - 15.9 %  Procalcitonin     Status: None   Collection Time: 10/28/22  9:07 PM  Result Value Ref Range   Procalcitonin 0.25 ng/mL  Phosphorus     Status: Abnormal   Collection Time: 10/28/22  9:07 PM  Result Value Ref Range   Phosphorus 2.2 (L) 2.5 - 4.6 mg/dL  Osmolality     Status: Abnormal   Collection Time: 10/28/22  9:07 PM  Result Value Ref Range   Osmolality 296 (H) 275 - 295 mOsm/kg  CK     Status: None   Collection Time: 10/28/22  9:07 PM  Result Value Ref Range   Total CK 142 49 - 397 U/L  CBC with Differential/Platelet     Status: Abnormal   Collection Time: 10/28/22  9:07 PM  Result Value Ref Range   WBC 5.9 4.0 - 10.5 K/uL   RBC 3.30 (L) 4.22 - 5.81 MIL/uL   Hemoglobin 9.7 (L) 13.0 - 17.0 g/dL   HCT 16.1 (L) 09.6 - 04.5 %   MCV 93.0 80.0 - 100.0 fL   MCH 29.4 26.0 - 34.0 pg   MCHC 31.6 30.0 - 36.0 g/dL   RDW 40.9 (H) 81.1 - 91.4 %   Platelets 385 150 - 400 K/uL   nRBC 0.0 0.0 - 0.2 %   Neutrophils Relative % 81 %   Neutro Abs 4.8 1.7 - 7.7 K/uL   Lymphocytes Relative 10 %   Lymphs Abs 0.6 (L) 0.7 - 4.0 K/uL   Monocytes Relative 8 %    Monocytes Absolute 0.5 0.1 - 1.0 K/uL   Eosinophils Relative 1 %   Eosinophils Absolute 0.0 0.0 - 0.5 K/uL   Basophils Relative 0 %   Basophils Absolute 0.0 0.0 - 0.1 K/uL   Immature Granulocytes 0 %   Abs Immature Granulocytes 0.02 0.00 - 0.07 K/uL  Basic metabolic panel     Status: Abnormal   Collection Time: 10/28/22  9:07 PM  Result Value Ref Range   Sodium 137 135 - 145 mmol/L   Potassium 3.1 (L) 3.5 - 5.1 mmol/L   Chloride 112 (H) 98 - 111 mmol/L   CO2 13 (L) 22 - 32 mmol/L   Glucose, Bld 103 (H) 70 - 99 mg/dL   BUN 17 8 - 23 mg/dL   Creatinine, Ser 7.82 (H) 0.61 - 1.24 mg/dL   Calcium 7.4 (L) 8.9 - 10.3 mg/dL   GFR, Estimated 43 (L) >60 mL/min   Anion gap 12 5 - 15  Troponin I (High Sensitivity)     Status: None   Collection Time: 10/28/22  9:07 PM  Result Value Ref Range   Troponin I (High Sensitivity) 16 <18 ng/L  Blood gas, venous     Status: Abnormal   Collection Time: 10/29/22 12:47 AM  Result Value Ref Range   pH, Ven 7.26 7.25 - 7.43   pCO2, Ven 34 (L) 44 - 60 mmHg   pO2, Ven <31 (LL) 32 - 45 mmHg   Bicarbonate 15.3 (L) 20.0 - 28.0 mmol/L   Acid-base deficit 11.0 (H) 0.0 - 2.0 mmol/L   O2 Saturation 49.8 %   Patient temperature 36.6   Lactic acid, plasma     Status: None   Collection Time: 10/29/22 12:47 AM  Result Value Ref Range   Lactic Acid, Venous 1.2 0.5 - 1.9 mmol/L  Vitamin B12     Status: None   Collection Time: 10/29/22  5:18 AM  Result Value Ref Range   Vitamin B-12 363 180 - 914 pg/mL  Magnesium     Status: Abnormal   Collection Time: 10/29/22  5:18 AM  Result Value Ref Range   Magnesium 1.5 (L) 1.7 - 2.4 mg/dL  Phosphorus     Status: Abnormal   Collection Time: 10/29/22  5:18 AM  Result Value Ref Range   Phosphorus 1.9 (L) 2.5 - 4.6 mg/dL  Comprehensive metabolic panel     Status: Abnormal   Collection Time: 10/29/22  5:18 AM  Result Value Ref  Range   Sodium 137 135 - 145 mmol/L   Potassium 3.0 (L) 3.5 - 5.1 mmol/L   Chloride 116 (H)  98 - 111 mmol/L   CO2 15 (L) 22 - 32 mmol/L   Glucose, Bld 133 (H) 70 - 99 mg/dL   BUN 14 8 - 23 mg/dL   Creatinine, Ser 4.01 (H) 0.61 - 1.24 mg/dL   Calcium 7.0 (L) 8.9 - 10.3 mg/dL   Total Protein 5.7 (L) 6.5 - 8.1 g/dL   Albumin 3.1 (L) 3.5 - 5.0 g/dL   AST 20 15 - 41 U/L   ALT 11 0 - 44 U/L   Alkaline Phosphatase 75 38 - 126 U/L   Total Bilirubin 0.4 0.3 - 1.2 mg/dL   GFR, Estimated 51 (L) >60 mL/min   Anion gap 6 5 - 15  CBC     Status: Abnormal   Collection Time: 10/29/22  5:18 AM  Result Value Ref Range   WBC 5.9 4.0 - 10.5 K/uL   RBC 2.79 (L) 4.22 - 5.81 MIL/uL   Hemoglobin 8.3 (L) 13.0 - 17.0 g/dL   HCT 02.7 (L) 25.3 - 66.4 %   MCV 90.0 80.0 - 100.0 fL   MCH 29.7 26.0 - 34.0 pg   MCHC 33.1 30.0 - 36.0 g/dL   RDW 40.3 (H) 47.4 - 25.9 %   Platelets 370 150 - 400 K/uL   nRBC 0.0 0.0 - 0.2 %  Lactic acid, plasma     Status: None   Collection Time: 10/29/22  5:18 AM  Result Value Ref Range   Lactic Acid, Venous 1.8 0.5 - 1.9 mmol/L  Folate     Status: None   Collection Time: 10/29/22  5:18 AM  Result Value Ref Range   Folate 7.5 >5.9 ng/mL  Prealbumin     Status: Abnormal   Collection Time: 10/29/22  5:18 AM  Result Value Ref Range   Prealbumin 13 (L) 18 - 38 mg/dL    I have reviewed pertinent nursing notes, vitals, labs, and images as necessary. I have ordered labwork to follow up on as indicated.  I have reviewed the last notes from staff over past 24 hours. I have discussed patient's care plan and test results with nursing staff, CM/SW, and other staff as appropriate.  Time spent: Greater than 50% of the 55 minute visit was spent in counseling/coordination of care for the patient as laid out in the A&P.   LOS: 0 days   Lewie Chamber, MD [facility] 10/29/2022, 2:05 PM

## 2022-10-29 NOTE — Care Management Obs Status (Signed)
MEDICARE OBSERVATION STATUS NOTIFICATION   Patient Details  Name: FABIANO THIRY MRN: [id number] Date of Birth: 1940-06-28   Medicare Observation Status Notification Given:       [name], LCSW 10/29/2022, 10:41 AM

## 2022-10-29 NOTE — Consult Note (Signed)
WOC Nurse Consult Note: Reason for Consult: right heel wound Wound type: Stage 2 Pressure Injury Pressure Injury POA: Yes Measurement: 1.5cm x 2.0cm x 0cm  Wound bed:100% resolving serous blister  Drainage (amount, consistency, odor) none Periwound:intact  Dressing procedure/placement/frequency: Offload heel at all times with Prevalon boot, WOC requested unit secretary to order for patient  Paint heel wound with betadine daily, allow to air dry.   Discussed POC with patient  Re consult if needed, will not follow at this time. Thanks  Zadin Lange [employer], RN,CWOCN, CNS, CWON-AP [phone])

## 2022-10-29 NOTE — Assessment & Plan Note (Signed)
-   follows with Dr. Candise Che; last seen 10/08/22 - diagnosed with MM with AL amyloidosis with anemia, renal insufficiency, and lytic bone lesions - last treatment 07/16/22; functional status and electrolyte abnormalities have limited resuming treatment - needs outpatient involvement of palliative care

## 2022-10-29 NOTE — TOC Initial Note (Signed)
Transition of Care [facility]) - Initial/Assessment Note    Patient Details  Name: Frank Hernandez MRN: [id number] Date of Birth: Dec 12, 1940  Transition of [facility] CM/SW Contact:    [name], LCSW Phone Number: 10/29/2022, 10:03 AM  Clinical Narrative:    Patient discharged home with [facility] last week and has returned. TOC will follow for needs.                Expected Discharge Plan: Home w Home Health Services Barriers to Discharge: Continued Medical Work up   Patient Goals and CMS Choice Patient states their goals for this hospitalization and ongoing recovery are:: Go home          Expected Discharge Plan and Services In-house Referral: NA Discharge Planning Services: NA   Living arrangements for the past 2 months: Single Family Home                 DME Arranged: N/A DME Agency: NA       HH Arranged: NA          Prior Living Arrangements/Services Living arrangements for the past 2 months: Single Family Home Lives with:: Spouse Patient language and need for interpreter reviewed:: Yes Do you feel safe going back to the place where you live?: Yes      Need for Family Participation in Patient Care: Yes (Comment) Care giver support system in place?: Yes (comment) Current home services: DME Criminal Activity/Legal Involvement Pertinent to Current Situation/Hospitalization: No - Comment as needed  Activities of Daily Living Home Assistive Devices/Equipment: Walker (specify type), Wheelchair ADL Screening (condition at time of admission) Patient's cognitive ability adequate to safely complete daily activities?: Yes Is the patient deaf or have difficulty hearing?: No Does the patient have difficulty seeing, even when wearing glasses/contacts?: No Does the patient have difficulty concentrating, remembering, or making decisions?: No Patient able to express need for assistance with ADLs?: Yes Does the patient have difficulty dressing or bathing?: Yes Independently  performs ADLs?: No Communication: Independent Dressing (OT): Needs assistance Grooming: Needs assistance Feeding: Independent Bathing: Needs assistance Toileting: Needs assistance In/Out Bed: Needs assistance Walks in Home: Needs assistance Does the patient have difficulty walking or climbing stairs?: Yes Weakness of Legs: Both Weakness of Arms/Hands: None  Permission Sought/Granted Permission sought to share information with : Family Supports Permission granted to share information with : Yes, Release of Information Signed  Share Information with NAME: Peggy, Spouse           Emotional Assessment Appearance:: Appears stated age     Orientation: : Oriented to Self Alcohol / Substance Use: Not Applicable Psych Involvement: No (comment)  Admission diagnosis:  Dehydration [E86.0] Hypokalemia [E87.6] AKI (acute kidney injury) (HCC) [N17.9] Patient Active Problem List   Diagnosis Date Noted   Chronic diastolic CHF (congestive heart failure) (HCC) 10/28/2022   A-fib (HCC) 10/28/2022   Colon distention 10/18/2022   Need for emotional support 10/14/2022   Palliative care encounter 10/10/2022   History of pulmonary embolism 10/08/2022   Protein-calorie malnutrition, severe 09/11/2022   Hyperkalemia 09/08/2022   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/09/2022   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   Hypomagnesemia 07/29/2022   Bowel obstruction (HCC) 07/29/2022   Prolonged QT interval 07/29/2022   Cellulitis of both lower extremities 07/29/2022   Acute pulmonary embolism (HCC) 07/29/2022   Glaucoma 07/29/2022   Bradyarrhythmia 06/01/2022   Sinus pause 05/29/2022   Multiple myeloma (HCC) 05/29/2022   Acute renal failure superimposed on stage 3a chronic  kidney disease (HCC) 05/28/2022   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   Hypotension 05/28/2022   Dehydration 05/28/2022   Diarrhea 05/28/2022   Lactic acidosis 05/28/2022   Counseling regarding advance  care planning and goals of care 04/22/2022   Multiple myeloma not having achieved remission (HCC) 04/21/2022   Vitamin D deficiency 04/19/2022   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) 04/15/2022   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   Closed displaced subtrochanteric fracture of right femur (HCC) 04/13/2022   Normocytic anemia 04/13/2022   Chronic kidney disease, stage 3a (HCC) 04/03/2022   Decreased oral intake 04/02/2022   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   Essential hypertension 04/01/2022   Pain in joint, lower leg 04/01/2022   Effusion of right knee 03/30/2022   Pressure injury of skin 03/30/2022   Effusion, right knee 03/30/2022   Concussion with unknown loss of consciousness status 12/24/2020   Hypokalemia    Rib fractures 06/10/2020   Severe essential hypertension 11/15/2018   Syncope 12/26/2015   Pre-diabetes 12/26/2015   PCP:  [name] (Inactive) Pharmacy:   [id number] [place] - [address] [place] [address] Phone: [phone] Fax: [phone]     Social Determinants of Health (SDOH) Social History: SDOH Screenings   Food Insecurity: No Food Insecurity (10/28/2022)  Housing: Low Risk  (10/28/2022)  Transportation Needs: No Transportation Needs (10/28/2022)  Utilities: Not At Risk (10/28/2022)  Depression (PHQ2-9): Low Risk  (04/29/2022)  Tobacco Use: Low Risk  (10/28/2022)  Health Literacy: Adequate Health Literacy (10/22/2022)   SDOH Interventions:     Readmission Risk Interventions    10/13/2022    4:11 PM 09/09/2022   12:00 PM 08/16/2022   10:15 AM  Readmission Risk Prevention Plan  Transportation Screening Complete Complete Complete  Medication Review Oceanographer) Complete Complete Complete  PCP or Specialist appointment within 3-5 days of discharge Complete Complete Complete  HRI or Home Care Consult Complete  Complete Complete  SW Recovery Care/Counseling Consult Complete Complete Complete  Palliative Care Screening Complete Not Applicable Not Applicable  Skilled Nursing Facility Patient Refused Complete Complete

## 2022-10-29 NOTE — Assessment & Plan Note (Signed)
-   Continue Eliquis 

## 2022-10-29 NOTE — Progress Notes (Signed)
Initial Nutrition Assessment  INTERVENTION:   -Ensure Plus High Protein po BID, each supplement provides 350 kcal and 20 grams of protein.   -Liberalize diet to regular to promote good intakes and to help improve taste of food.   Given stage 2 pressure injury, chronic diarrhea and taste changes: -500 mg Vitamin C BID -220 mg Zinc sulfate daily x 14 days  NUTRITION DIAGNOSIS:   Increased nutrient needs related to cancer and cancer related treatments, chronic illness, diarrhea as evidenced by estimated needs.  GOAL:   Patient will meet greater than or equal to 90% of their needs  MONITOR:   PO intake, Supplement acceptance, Labs, Weight trends, I & O's, Skin  REASON FOR ASSESSMENT:   Consult Assessment of nutrition requirement/status  ASSESSMENT:   82 y.o. 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 Admitted with dehydration recurrent diarrhea nausea vomiting.  Patient unavailable at time of visit, working with therapy and receiving nursing care. Will attempt to speak with patient at later time. Per chart review, pt was last seen by RD team 7/12. At that time, pt was diagnosed with severe malnutrition, was having weight loss and poor appetite. Pt most recently discharged on 8/23 and reports having N/V and diarrhea at home. Per wife pt has not been eating much at home as food still tastes bad to him. Will start zinc supplements to see if this will help. As well as aid in wound healing for pt's stage 2 pressure injury on right heel. Will add vitamin C for the same purpose.   Per weight records, pt has lost 41 lbs since 4/15 (18% wt loss x 4.5 months, significant for time frame).  Medications: Vitamin B-12, Ferrous sulfate, Creon, Imodium, Multivitamin with minerals daily, Thiamine, IV  Mg sulfate, IV KCl, K-Phos  Labs reviewed: Low K Low Phos Low Mg  Low iron  NUTRITION - FOCUSED PHYSICAL EXAM:  Unable to complete at this time.  Diet Order:   Diet Order             Diet Heart Room service appropriate? Yes; Fluid consistency: Thin  Diet effective now                   EDUCATION NEEDS:   Not appropriate for education at this time  Skin:  Skin Assessment: Skin Integrity Issues: Skin Integrity Issues:: Stage II Stage II: right heel  Last BM:  8/30 -type 7  Height:   Ht Readings from Last 1 Encounters:  10/28/22 5\' 9"  (1.753 m)    Weight:   Wt Readings from Last 1 Encounters:  10/28/22 80.7 kg    BMI:  Body mass index is 26.27 kg/m.  Estimated Nutritional Needs:   Kcal:  2000-2200  Protein:  90-100g  Fluid:  2L/day  Tilda Franco, MS, RD, LDN Inpatient Clinical Dietitian Contact information available via Amion

## 2022-10-29 NOTE — Assessment & Plan Note (Signed)
-   Reported syncopal event prior to admission with associated lightheadedness.  Associated with low blood pressure

## 2022-10-29 NOTE — TOC Progression Note (Addendum)
Transition of Care [facility]) - Progression Note    Patient Details  Name: Frank Hernandez MRN: [id number] Date of Birth: 23-Feb-1941  [facility] CM/SW Contact  Adrian Prows, RN Phone Number: 10/29/2022, 3:14 PM  Clinical Narrative:    Notified by Cordelia Pen, RN that pt's wife requested call because she had questions; called pt's wife Avanish Wiemken [phone]); contacted pt's wife; she would like information related to pall care and hospice services; she also gave additional POC [employer] (dtr) [phone]; Dr Frederick Peers notified via secure chat.  [id number]- pt's wife given list of hospice and palliative care agencies; she selected [facility]; Misty Green, Hospital Liaison for agency notified via secure chat.  Expected Discharge Plan: Home w Home Health Services Barriers to Discharge: Continued Medical Work up  Expected Discharge Plan and Services In-house Referral: NA Discharge Planning Services: NA   Living arrangements for the past 2 months: Single Family Home                 DME Arranged: N/A DME Agency: NA       HH Arranged: NA           Social Determinants of Health (SDOH) Interventions SDOH Screenings   Food Insecurity: No Food Insecurity (10/28/2022)  Housing: Low Risk  (10/28/2022)  Transportation Needs: No Transportation Needs (10/28/2022)  Utilities: Not At Risk (10/28/2022)  Depression (PHQ2-9): Low Risk  (04/29/2022)  Tobacco Use: Low Risk  (10/28/2022)  Health Literacy: Adequate Health Literacy (10/22/2022)    Readmission Risk Interventions    10/13/2022    4:11 PM 09/09/2022   12:00 PM 08/16/2022   10:15 AM  Readmission Risk Prevention Plan  Transportation Screening Complete Complete Complete  Medication Review Oceanographer) Complete Complete Complete  PCP or Specialist appointment within 3-5 days of discharge Complete Complete Complete  HRI or Home Care Consult Complete Complete Complete  SW Recovery Care/Counseling Consult Complete  Complete Complete  Palliative Care Screening Complete Not Applicable Not Applicable  Skilled Nursing Facility Patient Refused Complete Complete

## 2022-10-29 NOTE — Assessment & Plan Note (Signed)
-   In setting of dehydration and syncope on admission, BP meds on hold

## 2022-10-29 NOTE — Assessment & Plan Note (Signed)
-   I discussed with patient bedside.  Initially was not wanting hospice when seen, but after further discussions with GI he was more open to this - With his current malnourished state, he is expected to continue to have further decline especially if nutrition does not improve - he was referred to hospice prior to discharge and he was amenable to this.

## 2022-10-29 NOTE — Hospital Course (Signed)
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 He again presented with poor oral intake at home.  He was again found to have hypokalemia and AKI on workup.  There was concern for potential ongoing diarrhea but after further discussion with GI, his bowel movements were considered appropriate.  He is recommended to continue on his regimen at home. He has had extensive GI workup in the past, see notes. He was started on fluids on admission and admitted for further electrolyte repletion and monitoring.  He also met with hospice and was enrolled in outpatient hospice at time of discharge given his ongoing decline.

## 2022-10-29 NOTE — Assessment & Plan Note (Signed)
-   no s/s exac - will monitor while on fluids -Last echo reviewed from 07/30/2022.  EF 65 to 70%, grade 1 DD, no RWMA

## 2022-10-29 NOTE — Assessment & Plan Note (Signed)
-   Baseline hemoglobin 8 to 9 g/dL

## 2022-10-29 NOTE — Assessment & Plan Note (Signed)
-   QTc 650 on EKG - Optimizing electrolytes as best as possible

## 2022-10-29 NOTE — Consult Note (Addendum)
Consultation Note   Referring Provider:  [facility] PCP: [name] (Inactive) Primary Gastroenterologist: Dr. Amada Jupiter        Reason for Consultation: diarrhea DOA: 10/28/2022         Hospital Day: 2   ASSESSMENT & PLAN   82 y.o. 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 Patient discharged home on August 23rd but since then continued to deteriorate at home with decreased p.o. intake. He also had nausea,vomiting and abdominal pain feeling dehydrated for the past 3 days at the point where he started to feel lightheaded and possibly had a syncopal episode initially found to have blood pressures in the 70s, but later improved. Found to be in AKI with potassium down to 2.7. GI consulted for diarrhea.   Diarrhea, chronic- during patients last admission (8/9) he received extensive workup for diarrhea. Negative GI pathogen panel, Cdiff, Microsporidia spore. Previous EPEC 07/2022 treated with azithromycin x 3 days. Negative flex sig with bx 6/10. CT in May showed mild global pancreatic atrophy. Normal ESR/CRP, TSH, celiac.  Negative fecal calprotectin. Low fecal elastase (could be false + in setting of loose stools) and started on Creon with meals. VIP,calcitonin, and gastrin level  normal.Flex sig showed stool ( mucoid consistency w/ cholestyramine and iron particles visible) in the rectum and sigmoid on this unprepped exam- areas of mucosa seen were normal. Biopsies normal including no evidence of amyloidosis. Current admission; WBC 5.9. Ct scan shows persistent diffuse dilatation of the colon with multiple air-levels, similar to previous CT from 3 months ago, with decompression of distal sigmoid colon and  rectum distal to the chronic sigmoid colon containing left inguinal hernia. Patients caregiver/wife states he has had 2-3 semi formed stools per day since discharge. She is giving him the imodium, cholestyramine, and Creon TID at home. Per RN has not had BM today. -Continue Creon and Imodium TID . Added his home cholestyramine TID onto daily medications.   Poor po intake/failure to thrive with dehydration- a primary concern per patients caregiver/wife his intake at home is very little. Patient on Remeron 7.5 mg po daily.  Patient states nothing tastes good.Wife has spoken to Child psychotherapist in past about receiving palliative care. -Suggest palliative care assessment, wife has even suggested as option.  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 Today is 3.0. Potassium replacement ordered. Ct unchanged from previous. -continue to monitor  Chronic normocytic anemia- Hgb today is 8.3. Baseline 8-9. patient on oral Iron. Iron 23 and Ferritin 463.No overt GI bleeding.  -monitor CBC  CKD stage III. BUN  16>17>14   Creat 1.80 > 1.59 > 1.39. -Continue to monitor  Multiple myeloma with AL amyloidosis, subacute pathologic intertrochanteric fracture of his right femur s/p IM nailing and multiple bony lytic lesions in the pelvis and spine. Last dose of Daratumumab was 06/2022. Followed by oncologist Dr. Candise Che.  CHF - diuretics on hold. Echo pending from yesterday.  PE/DVT  -on Eliquis  HISTORY OF PRESENT ILLNESS  Patient lying in bed, very tearful he is back in the hospital. We called his wife [name] who is also his caregiver to review his medications and make sure he was taking the Creon, Cholestyramine, and Imodium TID as prescribed upon discharge.She confirms he is getting the medications. She reports since his discharge his oral intake has been very little. He does not enjoy the taste of food. She reports he has  2-3 semi formed stools per day that typically are during the night hours. No blood in stool reported. Patient denies abdominal pain. His wife reports he did have episode of nausea yesterday followed by spitting up clear mucus. She did not describe any actual vomiting. Her son Thereasa Distance helps at home, but she has concerns long term with being able to provide care for her husband. Case manager came into room at end of visit and requested they reach out to wife. Per RN no bowel movement this morning.  Previous GI Evaluations   Endoscopy 09/2022  Impression:  - Normal esophagus.  - Normal stomach. J shaped variant  - Normal examined duodenum.  - Biopsies were taken with a cold forceps for histology in the second portion of the duodenum. R/ O amyloidosis  Flex sig 09/2022  Impression:  - Stool ( mucoid consistency w/ cholestyramine and iron particles visible) in the rectum and in the sigmoid colon on this unprepped exam - areas of mucsa seen wre normal, Biopsied. r/ O amyloidosis   FINAL MICROSCOPIC DIAGNOSIS:   A. DUODENUM, BIOPSY:  Duodenal mucosa with preserved villoglandular architecture without  increased intraepithelial lymphocytes or evidence of active  inflammation.  No evidence of gluten sensitive enteropathy.  No evidence of amyloid deposit on HE and Congo Red stain.   B. RECTUM, BIOPSY:       Colonic mucosa with no significant diagnostic alteration.       No evidence of lymphocytic colitis or collagenous colitis.       Negative for activity, chronicity, granuloma, dysplasia or  malignancy.      No evidence of amyloid deposit on HE and [place] Red stain.   COMMENT:   [place] Red stains were performed on both A and B and do not show evidence  of amyloid deposit.   Controls worked appropriately.   Flex sig 07/2022  Impression:  - Scattered areas of mildly edematous mucosa was noted in the proximal sigmoid colon, in the descending colon and in the transverse colon. This was  biopsied.  - Diverticulosis in the sigmoid colon.  - The rectum is normal.  FINAL MICROSCOPIC DIAGNOSIS:   A. COLON, RANDOM, BIOPSY:  -  Colonic mucosa with hyperplastic change.    CT ABDOMEN AND PELVIS WITH CONTRAST (8/29)  IMPRESSION: 1. Persistent diffuse dilatation of the colon with multiple air-levels, similar to previous CT from 3 months ago, with decompression of distal sigmoid colon and rectum distal to the chronic sigmoid colon containing left inguinal hernia. Correlate clinically for incarceration and resulting large bowel obstruction. 2. Unchanged size of large right inguinal hernia, now containing a portion of the cecum. No evidence of small-bowel obstruction proximal to this hernia. 3. No evidence of bowel perforation, abscess or ascites. 4. Mildly increased subsegmental atelectasis at the left lung base associated with asymmetric elevation of the left hemidiaphragm. 5.  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   ABDOMEN - 2 VIEW (8/14)  IMPRESSION: Significant gaseous distention of the colon is similar to the study from 08/04/2022.  CT ABDOMEN AND PELVIS  WITH CONTRAST (06/2022)  IMPRESSION: Left lower lobe segmental pulmonary embolism. Overall minimal clot burden.   Breathing motion.   Dilated colon with air-fluid level seen and scattered colonic diverticula. Transition seen in the area of the new left inguinal hernia involving the sigmoid colon. Please correlate for reduce ability. No pneumatosis, free air or fluid. Small bowel and stomach are nondilated but there is an enteric tube.   Separate large known right inguinal hernia involving small bowel and significant portion of the urinary bladder.   Enteric tube in the stomach.   Lytic bone lesions consistent with known history of myeloma.    Labs and Imaging:  PORTABLE CHEST 1 VIEW  8/29  IMPRESSION: Shallow inspiration.  Lungs are clear.  RENAL / URINARY TRACT ULTRASOUND COMPLETE   (08/2022)  IMPRESSION: No hydronephrosis or shadowing stone.   Recent Labs    10/28/22 1527 10/28/22 2107 10/29/22 0518  WBC 4.9 5.9 5.9  HGB 9.4* 9.7* 8.3*  HCT 29.0* 30.7* 25.1*  PLT 406* 385 370   Recent Labs    10/28/22 1527 10/28/22 2107 10/29/22 0518  NA 135 137 137  K 2.7* 3.1* 3.0*  CL 111 112* 116*  CO2 15* 13* 15*  GLUCOSE 161* 103* 133*  BUN 16 17 14   CREATININE 1.80* 1.59* 1.39*  CALCIUM 7.3* 7.4* 7.0*   Recent Labs    10/29/22 0518  PROT 5.7*  ALBUMIN 3.1*  AST 20  ALT 11  ALKPHOS 75  BILITOT 0.4   No results for input(s): "HEPBSAG", "HCVAB", "HEPAIGM", "HEPBIGM" in the last 72 hours. No results for input(s): "LABPROT", "INR" in the last 72 hours.    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   Pre-diabetes     Past Surgical History:  Procedure Laterality Date   BIOPSY  08/09/2022   Procedure: BIOPSY;  Surgeon: Shellia Cleverly, DO;  Location: [facility];  Service: Gastroenterology;;   BIOPSY  10/20/2022   Procedure: BIOPSY;  Surgeon: Iva Boop, MD;  Location: [facility];  Service: Gastroenterology;;   COLONOSCOPY     ESOPHAGOGASTRODUODENOSCOPY N/A 10/20/2022   Procedure: ESOPHAGOGASTRODUODENOSCOPY (EGD);  Surgeon: Iva Boop, MD;  Location: [facility];  Service: Gastroenterology;  Laterality: N/A;   FLEXIBLE SIGMOIDOSCOPY N/A 08/09/2022   Procedure: FLEXIBLE SIGMOIDOSCOPY;  Surgeon: Shellia Cleverly, DO;  Location: [facility];  Service: Gastroenterology;  Laterality: N/A;  propofol   FLEXIBLE SIGMOIDOSCOPY N/A 10/20/2022   Procedure: FLEXIBLE SIGMOIDOSCOPY;  Surgeon: Iva Boop, MD;   Location: [facility];  Service: Gastroenterology;  Laterality: N/A;   INTRAMEDULLARY (IM) NAIL INTERTROCHANTERIC Left 06/11/2020   Procedure: INTRAMEDULLARY (IM) NAIL INTERTROCHANTRIC;  Surgeon: Roby Lofts, MD;  Location: [facility];  Service: Orthopedics;  Laterality: Left;   INTRAMEDULLARY (IM) NAIL INTERTROCHANTERIC Right 04/17/2022   Procedure: INTRAMEDULLARY (IM) NAIL INTERTROCHANTERIC;  Surgeon: London Sheer, MD;  Location: [facility];  Service: Orthopedics;  Laterality: Right;   KNEE ARTHROSCOPY Bilateral    POLYPECTOMY      Family History  Problem Relation Age of Onset   Colon cancer Neg Hx     Prior to Admission medications   Medication Sig Start Date  End Date Taking? Authorizing Provider  acetaminophen (TYLENOL) 500 MG tablet Take 1,000 mg by mouth every 8 (eight) hours as needed for moderate pain.   Yes [provider]  apixaban (ELIQUIS) 5 MG TABS tablet Take 1 tablet (5 mg total) by mouth 2 (two) times daily. 08/16/22  Yes Azucena Fallen, MD  cholestyramine light (PREVALITE) 4 g packet Take 1 packet (4 g total) by mouth 3 (three) times daily. 10/22/22  Yes Willeen Niece, MD  ferrous sulfate 325 (65 FE) MG tablet Take 1 tablet (325 mg total) by mouth 2 (two) times daily with a meal. 06/01/22  Yes Rodolph Bong, MD  lipase/protease/amylase (CREON) 36000 UNITS CPEP capsule Take 1 capsule (36,000 Units total) by mouth 3 (three) times daily before meals. 10/22/22  Yes Willeen Niece, MD  loperamide (IMODIUM) 2 MG capsule Take 1 capsule (2 mg total) by mouth 3 (three) times daily. 10/22/22  Yes Willeen Niece, MD  mirtazapine (REMERON) 7.5 MG tablet Take 7.5 mg by mouth at bedtime. 10/01/22  Yes [provider]  pantoprazole (PROTONIX) 40 MG tablet Take 1 tablet (40 mg total) by mouth 2 (two) times daily. 08/16/22  Yes Azucena Fallen, MD  psyllium (HYDROCIL/METAMUCIL) 95 % PACK Take 1 packet by mouth daily. 10/23/22  Yes Willeen Niece, MD  Vitamin D,  Ergocalciferol, (DRISDOL) 1.25 MG (50000 UNIT) CAPS capsule Take 1 capsule (50,000 Units total) by mouth once a week. Patient taking differently: Take 50,000 Units by mouth every 7 (seven) days. 07/09/22  Yes Johney Maine, MD    Current Facility-Administered Medications  Medication Dose Route Frequency Provider Last Rate Last Admin   acetaminophen (TYLENOL) tablet 650 mg  650 mg Oral Q6H PRN Therisa Doyne, MD   650 mg at 10/29/22 1610   Or   acetaminophen (TYLENOL) suppository 650 mg  650 mg Rectal Q6H PRN Therisa Doyne, MD       apixaban (ELIQUIS) tablet 5 mg  5 mg Oral BID Doutova, Anastassia, MD   5 mg at 10/29/22 0834   ferrous sulfate tablet 325 mg  325 mg Oral BID WC Doutova, Anastassia, MD   325 mg at 10/29/22 0834   HYDROcodone-acetaminophen (NORCO/VICODIN) 5-325 MG per tablet 1-2 tablet  1-2 tablet Oral Q4H PRN Therisa Doyne, MD       lipase/protease/amylase (CREON) capsule 36,000 Units  36,000 Units Oral TID [place] Nickel, MD   36,000 Units at 10/29/22 9604   loperamide (IMODIUM) capsule 2 mg  2 mg Oral TID Therisa Doyne, MD   2 mg at 10/29/22 0834   pantoprazole (PROTONIX) EC tablet 40 mg  40 mg Oral BID Therisa Doyne, MD   40 mg at 10/29/22 0834   potassium PHOSPHATE 30 mmol in dextrose 5 % 500 mL infusion  30 mmol Intravenous Once Lewie Chamber, MD 85 mL/hr at 10/29/22 0829 30 mmol at 10/29/22 5409   sodium bicarbonate tablet 650 mg  650 mg Oral TID Lewie Chamber, MD   650 mg at 10/29/22 8119   thiamine (VITAMIN B1) injection 100 mg  100 mg Intravenous Daily Therisa Doyne, MD   100 mg at 10/29/22 1478   Facility-Administered Medications Ordered in Other Encounters  Medication Dose Route Frequency Provider Last Rate Last Admin   potassium chloride SA (KLOR-CON M) CR tablet 40 mEq  40 mEq Oral Once Johney Maine, MD        Allergies as of 10/28/2022   (No Known Allergies)    Social History  Socioeconomic History    Marital status: Married    Spouse name: Not on file   Number of children: Not on file   Years of education: Not on file   Highest education level: Not on file  Occupational History   Not on file  Tobacco Use   Smoking status: Never   Smokeless tobacco: Never  Vaping Use   Vaping status: Never Used  Substance and Sexual Activity   Alcohol use: No    Alcohol/week: 0.0 standard drinks of alcohol   Drug use: No   Sexual activity: Not on file  Other Topics Concern   Not on file  Social History Narrative   Not on file   Social Determinants of Health   Financial Resource Strain: Not on file  Food Insecurity: No Food Insecurity (10/28/2022)   Hunger Vital Sign    Worried About Running Out of Food in the Last Year: Never true    Ran Out of Food in the Last Year: Never true  Transportation Needs: No Transportation Needs (10/28/2022)   PRAPARE - Administrator, Civil Service (Medical): No    Lack of Transportation (Non-Medical): No  Physical Activity: Not on file  Stress: Not on file  Social Connections: Not on file  Intimate Partner Violence: Not At Risk (10/28/2022)   Humiliation, Afraid, Rape, and Kick questionnaire    Fear of Current or Ex-Partner: No    Emotionally Abused: No    Physically Abused: No    Sexually Abused: No     Code Status   Code Status: Limited: Do not attempt resuscitation (DNR) -DNR-LIMITED -Do Not Intubate/DNI   Review of Systems: All systems reviewed and negative except where noted in HPI.  Physical Exam: Vital signs in last 24 hours: Temp:  [97.6 F (36.4 C)-97.9 F (36.6 C)] 97.9 F (36.6 C) (08/30 0800) Pulse Rate:  [66-92] 66 (08/30 0800) Resp:  [15-19] 16 (08/30 0800) BP: (104-146)/(68-85) 127/72 (08/30 0800) SpO2:  [98 %-100 %] 100 % (08/30 0800) Weight:  [80.7 kg] 80.7 kg (08/29 1454) Last BM Date : 10/28/22  General:  Pleasant male tearful and crying  Psych:  Cooperative. Normal mood and affect Eyes: Pupils equal Ears:   Normal auditory acuity Nose: No deformity, discharge or lesions Neck:  Supple, no masses felt Lungs:  decreased lung sounds in bilateral lobes , wheezing Heart:  Regular rate, irregular rhythm.  Abdomen:  Soft, nondistended, nontender, hyperactive bowel sounds, no masses felt Rectal :  Deferred Msk: Symmetrical without gross deformities.  Neurologic:  Alert, oriented, grossly normal neurologically Extremities : +1 BL edema Skin:  Intact without significant lesions.    Intake/Output from previous day: 08/29 0701 - 08/30 0700 In: 938.5 [I.V.:538.8; IV Piggyback:399.7] Out: 700 [Urine:700] Intake/Output this shift:  No intake/output data recorded.  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  Patient seen and examined.  Agree with comprehensive  consultation note as outlined above.  IMPRESSION: 1.  Failure to thrive.  This is the principal problem.  Reason for failure to thrive is advanced age, recent illnesses, and multiple comorbidities. 2.  Diarrhea.  This does not seem to be a problem currently.  After talking with the wife, the reports are of 2-3 semiformed stools daily.  He has been compliant with regimen of Questran, Creon, and Imodium.  He does have Ogilvie syndrome, so it is important that he does have daily bowel movements.  Again, significant diarrhea is not a current issue.  He had an exhaustive workup  regarding this, previously.  Nothing further to add. 3.  Multiple significant medical problems.  Per primary service.  Recommendations: 1.  Continue home regimen for bowels without change. 2.  Management of failure to thrive per primary service.  No further plans or recommendations from GI perspective.  Will sign off.  Wilhemina Bonito. Eda Keys., M.D. [facility] Division of Gastroenterology

## 2022-10-30 DIAGNOSIS — R197 Diarrhea, unspecified: Secondary | ICD-10-CM | POA: Diagnosis not present

## 2022-10-30 DIAGNOSIS — Z7189 Other specified counseling: Secondary | ICD-10-CM | POA: Diagnosis not present

## 2022-10-30 DIAGNOSIS — C9000 Multiple myeloma not having achieved remission: Secondary | ICD-10-CM | POA: Diagnosis not present

## 2022-10-30 DIAGNOSIS — N179 Acute kidney failure, unspecified: Secondary | ICD-10-CM | POA: Diagnosis not present

## 2022-10-30 LAB — BASIC METABOLIC PANEL
Anion gap: 9 (ref 5–15)
BUN: 11 mg/dL (ref 8–23)
CO2: 16 mmol/L — ABNORMAL LOW (ref 22–32)
Calcium: 7.2 mg/dL — ABNORMAL LOW (ref 8.9–10.3)
Chloride: 115 mmol/L — ABNORMAL HIGH (ref 98–111)
Creatinine, Ser: 1.26 mg/dL — ABNORMAL HIGH (ref 0.61–1.24)
GFR, Estimated: 57 mL/min — ABNORMAL LOW (ref >60)
Glucose, Bld: 77 mg/dL (ref 70–99)
Potassium: 3.5 mmol/L (ref 3.5–5.1)
Sodium: 140 mmol/L (ref 135–145)

## 2022-10-30 LAB — MAGNESIUM: Magnesium: 1.7 mg/dL (ref 1.7–2.4)

## 2022-10-30 LAB — PHOSPHORUS: Phosphorus: 2.7 mg/dL (ref 2.5–4.6)

## 2022-10-30 MED ORDER — MAGNESIUM SULFATE 2 GM/50ML IV SOLN
2.0000 g | Freq: Once | INTRAVENOUS | Status: AC
  Administered 2022-10-30: 2 g via INTRAVENOUS
  Filled 2022-10-30: qty 50

## 2022-10-30 MED ORDER — CYANOCOBALAMIN 1000 MCG PO TABS: 1000.0000 ug | ORAL_TABLET | Freq: Every day | ORAL | Status: DC

## 2022-10-30 MED ORDER — POTASSIUM CHLORIDE CRYS ER 20 MEQ PO TBCR
40.0000 meq | EXTENDED_RELEASE_TABLET | Freq: Once | ORAL | Status: AC
  Administered 2022-10-30: 40 meq via ORAL
  Filled 2022-10-30: qty 2

## 2022-10-30 MED ORDER — SODIUM BICARBONATE 650 MG PO TABS: 650.0000 mg | ORAL_TABLET | Freq: Two times a day (BID) | ORAL | 0 refills | Status: AC

## 2022-10-30 NOTE — Progress Notes (Signed)
[address] [facility] Liaison Note  Received request from Burnard Bunting, [facility] to discuss outpatient palliative vs hospice services at home.   Met with patient, daughter Danella Maiers and wife [name] at bedside just prior to discharge to discuss outpatient palliative and hospice care at home. Discussed hospice services, hospice philosophy and team approach to care.  Ultimately, patient and family decided to proceed home with hospice services.   No DME needs at this time.  Patient discharging home today by POV.  Please call with any hospice related questions or concerns.  Thank you, Haynes Bast, BSN, [facility] Liaison [phone]

## 2022-10-30 NOTE — TOC Transition Note (Addendum)
Transition of Care [facility]) - CM/SW Discharge Note   Patient Details  Name: Frank Hernandez MRN: [id number] Date of Birth: 02/17/1941  Transition of Care [facility]) CM/SW Contact:  Darleene Cleaver, LCSW Phone Number: 10/30/2022, 11:05 AM   Clinical Narrative:     CSW spoke to patient's wife [name], who stated that she would like home with hospice.  CSW spoke to [place] at [facility] and she confirmed the referral has been received.  Per patient's wife, he does not need any equipment to go home with.  CSW asked wife if patient will need EMS transport home she said no, he will be fine go to home via car.  Her son will bring the wheelchair out once he arrives at home.    Patient was open to [facility] PT, OT, and aide, but patient and family have decided they would rather do Home with hospice through [facility].  CSW updated Katina at [facility].    CSW updated attending physician and bedside nurse.  TOC signing off, please reconsult if any other TOC needs arise.  Final next level of care: Home w Hospice Care Barriers to Discharge: Barriers Resolved   Patient Goals and CMS Choice CMS Medicare.gov Compare Post Acute Care list provided to:: Patient Represenative (must comment) Choice offered to / list presented to : Spouse  Discharge Placement                    Name of family member notified: Wife Peggy Patient and family notified of of transfer: 10/30/22  Discharge Plan and Services Additional resources added to the After Visit Summary for   In-house Referral: NA Discharge Planning Services: NA            DME Arranged: N/A DME Agency: NA       HH Arranged: NA HH Agency: Other - See comment [facility]) Date [facility] Agency Contacted: 10/30/22 Time HH Agency Contacted: 1104 Representative spoke with at [facility] Agency: [place]  Social Determinants of Health (SDOH) Interventions SDOH Screenings   Food Insecurity: No Food Insecurity (10/28/2022)  Housing: Low Risk   (10/28/2022)  Transportation Needs: No Transportation Needs (10/28/2022)  Utilities: Not At Risk (10/28/2022)  Depression (PHQ2-9): Low Risk  (04/29/2022)  Tobacco Use: Low Risk  (10/28/2022)  Health Literacy: Adequate Health Literacy (10/22/2022)     Readmission Risk Interventions    10/13/2022    4:11 PM 09/09/2022   12:00 PM 08/16/2022   10:15 AM  Readmission Risk Prevention Plan  Transportation Screening Complete Complete Complete  Medication Review Oceanographer) Complete Complete Complete  PCP or Specialist appointment within 3-5 days of discharge Complete Complete Complete  HRI or Home Care Consult Complete Complete Complete  SW Recovery Care/Counseling Consult Complete Complete Complete  Palliative Care Screening Complete Not Applicable Not Applicable  Skilled Nursing Facility Patient Refused Complete Complete

## 2022-10-30 NOTE — Progress Notes (Signed)
OT Cancellation Note  Patient Details Name: JAHAZIAH CHEESEMAN MRN: [id number] DOB: 1940/06/24   Cancelled Treatment:    Reason Eval/Treat Not Completed: Other (comment) Patient reported that he was going home today and did not need occupational therapy. OT to continue to follow if patient does nto d/c today.  Rosalio Loud, MS Acute Rehabilitation Department Office# [phone]  10/30/2022, 11:18 AM

## 2022-10-30 NOTE — Discharge Summary (Signed)
Physician Discharge Summary   Frank Hernandez QIO:[id number] DOB: 1940/06/05 DOA: 10/28/2022  PCP: [name] (Inactive)  Admit date: 10/28/2022 Discharge date: 10/30/2022   Admitted From: Home Disposition:  Home with hospice Discharging physician: Lewie Chamber, MD Barriers to discharge: none  Recommendations at discharge: Continue hospice level care. Patient likely to continue to decline if nutrition remains poor.    Discharge Condition: stable CODE STATUS: DNR Diet recommendation:  Diet Orders (From admission, onward)     Start     Ordered   10/30/22 0000  Diet general        10/30/22 1035   10/29/22 1354  Diet regular Room service appropriate? Yes; Fluid consistency: Thin  Diet effective now       Question Answer Comment  Room service appropriate? Yes   Fluid consistency: Thin      10/29/22 1354            Hospital Course: Mr. 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 He again presented with poor oral intake at home.  He was again found to have hypokalemia and AKI on workup.  There was concern for potential ongoing diarrhea but after further discussion with GI, his bowel movements were considered appropriate.  He is recommended to continue on his regimen at home. He has had extensive GI workup in the past, see notes. He was started on fluids on admission and admitted for further electrolyte repletion and monitoring.  He also met with hospice and was enrolled in outpatient hospice at time of discharge given his ongoing decline.    Assessment and Plan: * Diarrhea-resolved as of 10/30/2022 - extensive workup with GI recently; negative etiology so far; has been maintained on Creon, Imodium, cholestyramine -GI again consulted on admission, appreciate assistanceI - Continuing home regimen as ordered per GI  Goals of care,  counseling/discussion - I discussed with patient bedside.  Initially was not wanting hospice when seen, but after further discussions with GI he was more open to this - With his current malnourished state, he is expected to continue to have further decline especially if nutrition does not improve - he was referred to hospice prior to discharge and he was amenable to this.   Acute renal failure superimposed on stage 3a chronic kidney disease (HCC) - patient has history of CKD3a. Baseline creat ~ 1.2, eGFR~ 48 - patient presents with increase in creat >0.3 mg/dL above baseline or creat increase >1.5x baseline presumed to have occurred within past 7 days PTA - creat 1.8 on admission - baseline creat also starting to fall in setting of muscle wasting  - s/p IVF    Hypokalemia - due to ongoing diarrhea/loose stools and poor intake - repleted  Multiple myeloma not having achieved remission (HCC) - follows with Dr. Candise Che; last seen 10/08/22 - diagnosed with MM with AL amyloidosis with anemia, renal insufficiency, and lytic bone lesions - last treatment 07/16/22; functional status and electrolyte abnormalities have limited resuming treatment - needs outpatient involvement of palliative care at the very least, but prior to discharge he changed his mind and accepted enrollment with hospice   A-fib [facility]) - Continue Eliquis  Chronic diastolic CHF (congestive heart failure) (HCC) - no s/s exac - will monitor while on fluids -Last echo reviewed from 07/30/2022.  EF 65 to 70%, grade 1 DD, no RWMA  History of pulmonary embolism -  Continue Eliquis  Prolonged QT interval - QTc 650 on EKG - Optimizing electrolytes as best as possible  Normocytic anemia - Baseline hemoglobin 8 to 9 g/dL  Essential hypertension - In setting of dehydration and syncope on admission, BP meds held during admission   Syncope-resolved as of 10/29/2022 - Reported syncopal event prior to admission with associated  lightheadedness.  Associated with low blood pressure    Principal Diagnosis: Diarrhea  Discharge Diagnoses: Active Hospital Problems   Diagnosis Date Noted   Goals of care, counseling/discussion 10/29/2022    Priority: 2.   Acute renal failure superimposed on stage 3a chronic kidney disease (HCC) 05/28/2022    Priority: 2.   Hypokalemia     Priority: 2.   Multiple myeloma not having achieved remission (HCC) 04/21/2022    Priority: 3.   A-fib (HCC) 10/28/2022    Priority: 4.   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   Syncope 12/26/2015 10/29/2022     Discharge Instructions     Amb Referral to Palliative Care   Complete by: As directed    Ambulatory referral to Hospice   Complete by: As directed    Diet general   Complete by: As directed    Discharge wound care:   Complete by: As directed    Apply betadine to right heel wound and let air dry   Increase activity slowly   Complete by: As directed       Allergies as of 10/30/2022   No Known Allergies      Medication List     TAKE these medications    acetaminophen 500 MG tablet Commonly known as: TYLENOL Take 1,000 mg by mouth every 8 (eight) hours as needed for moderate pain.   apixaban 5 MG Tabs tablet Commonly known as: ELIQUIS Take 1 tablet (5 mg total) by mouth 2 (two) times daily.   cholestyramine light 4 g packet Commonly known as: PREVALITE Take 1 packet (4 g total) by mouth 3 (three) times daily.   cyanocobalamin 1000 MCG tablet Take 1 tablet (1,000 mcg total) by mouth daily. Start taking on: October 31, 2022   ferrous  sulfate 325 (65 FE) MG tablet Take 1 tablet (325 mg total) by mouth 2 (two) times daily with a meal.   lipase/protease/amylase 81191 UNITS Cpep capsule Commonly known as: CREON Take 1 capsule (36,000 Units total) by mouth 3 (three) times daily before meals.   loperamide 2 MG capsule Commonly known as: IMODIUM Take 1 capsule (2 mg total) by mouth 3 (three) times daily.   mirtazapine 7.5 MG tablet Commonly known as: REMERON Take 7.5 mg by mouth at bedtime.   pantoprazole 40 MG tablet Commonly known as: PROTONIX Take 1 tablet (40 mg total) by mouth 2 (two) times daily.   psyllium 95 % Pack Commonly known as: HYDROCIL/METAMUCIL Take 1 packet by mouth daily.   sodium bicarbonate 650 MG tablet Take 1 tablet (650 mg total) by mouth 2 (two) times daily for 14 days.   Vitamin D (Ergocalciferol) 1.25 MG (50000 UNIT) Caps capsule Commonly known as: DRISDOL Take 1 capsule (50,000 Units total) by mouth once  a week. What changed: when to take this               Discharge Care Instructions  (From admission, onward)           Start     Ordered   10/30/22 0000  Discharge wound care:       Comments: Apply betadine to right heel wound and let air dry   10/30/22 1035            No Known Allergies   Discharge Exam: BP 127/79 (BP Location: Left Arm)   Pulse 89   Temp 98.6 F (37 C) (Oral)   Resp 18   Ht 5\' 9"  (1.753 m)   Wt 80.7 kg   SpO2 100%   BMI 26.27 kg/m  Physical Exam Constitutional:      General: He is not in acute distress.    Appearance: Normal appearance.  HENT:     Head: Normocephalic and atraumatic.     Mouth/Throat:     Mouth: Mucous membranes are moist.  Eyes:     Extraocular Movements: Extraocular movements intact.  Cardiovascular:     Rate and Rhythm: Normal rate and regular rhythm.  Pulmonary:     Effort: Pulmonary effort is normal. No respiratory distress.     Breath sounds: Normal breath sounds. No wheezing.  Abdominal:     General:  Bowel sounds are normal. There is no distension.     Palpations: Abdomen is soft.     Tenderness: There is no abdominal tenderness.  Musculoskeletal:        General: Normal range of motion.     Cervical back: Normal range of motion and neck supple.  Skin:    General: Skin is warm and dry.  Neurological:     General: No focal deficit present.     Mental Status: He is alert.  Psychiatric:        Mood and Affect: Mood normal.        Behavior: Behavior normal.      The results of significant diagnostics from this hospitalization (including imaging, microbiology, ancillary and laboratory) are listed below for reference.   Microbiology: No results found for this or any previous visit (from the past 240 hour(s)).   Labs: BNP (last 3 results) Recent Labs    07/29/22 0835 07/30/22 2304 08/09/22 1713  BNP 145.9* 357.6* 671.3*   Basic Metabolic Panel: Recent Labs  Lab 10/28/22 1527 10/28/22 2107 10/29/22 0518 10/30/22 0630  NA 135 137 137 140  K 2.7* 3.1* 3.0* 3.5  CL 111 112* 116* 115*  CO2 15* 13* 15* 16*  GLUCOSE 161* 103* 133* 77  BUN 16 17 14 11   CREATININE 1.80* 1.59* 1.39* 1.26*  CALCIUM 7.3* 7.4* 7.0* 7.2*  MG  --  1.6* 1.5* 1.7  PHOS  --  2.2* 1.9* 2.7   Liver Function Tests: Recent Labs  Lab 10/28/22 1527 10/29/22 0518  AST 26 20  ALT 13 11  ALKPHOS 100 75  BILITOT 0.7 0.4  PROT 6.6 5.7*  ALBUMIN 3.7 3.1*   Recent Labs  Lab 10/28/22 1527  LIPASE 24   No results for input(s): "AMMONIA" in the last 168 hours. CBC: Recent Labs  Lab 10/28/22 1527 10/28/22 2107 10/29/22 0518  WBC 4.9 5.9 5.9  NEUTROABS  --  4.8  --   HGB 9.4* 9.7* 8.3*  HCT 29.0* 30.7* 25.1*  MCV 90.6 93.0 90.0  PLT 406* 385 370   Cardiac  Enzymes: Recent Labs  Lab 10/28/22 2107  CKTOTAL 142   BNP: Invalid input(s): "POCBNP" CBG: No results for input(s): "GLUCAP" in the last 168 hours. D-Dimer No results for input(s): "DDIMER" in the last 72 hours. Hgb A1c No  results for input(s): "HGBA1C" in the last 72 hours. Lipid Profile No results for input(s): "CHOL", "HDL", "LDLCALC", "TRIG", "CHOLHDL", "LDLDIRECT" in the last 72 hours. Thyroid function studies No results for input(s): "TSH", "T4TOTAL", "T3FREE", "THYROIDAB" in the last 72 hours.  Invalid input(s): "FREET3" Anemia work up Recent Labs    10/28/22 2107 10/29/22 0518  VITAMINB12  --  363  FOLATE  --  7.5  FERRITIN 463*  --   TIBC 217*  --   IRON 23*  --   RETICCTPCT 2.2  --    Urinalysis    Component Value Date/Time   COLORURINE STRAW (A) 10/28/2022 1857   APPEARANCEUR CLEAR 10/28/2022 1857   LABSPEC 1.013 10/28/2022 1857   PHURINE 5.0 10/28/2022 1857   GLUCOSEU NEGATIVE 10/28/2022 1857   HGBUR NEGATIVE 10/28/2022 1857   BILIRUBINUR NEGATIVE 10/28/2022 1857   KETONESUR NEGATIVE 10/28/2022 1857   PROTEINUR NEGATIVE 10/28/2022 1857   NITRITE NEGATIVE 10/28/2022 1857   LEUKOCYTESUR NEGATIVE 10/28/2022 1857   Sepsis Labs Recent Labs  Lab 10/28/22 1527 10/28/22 2107 10/29/22 0518  WBC 4.9 5.9 5.9   Microbiology No results found for this or any previous visit (from the past 240 hour(s)).  Procedures/Studies: ECHOCARDIOGRAM COMPLETE  Result Date: 10/29/2022    ECHOCARDIOGRAM REPORT   Patient Name:   TYKEL WYNE Date of Exam: 10/29/2022 Medical Rec #:  [id number]           Height:       69.0 in Accession #:    [id number]          Weight:       177.9 lb Date of Birth:  [birth year]            BSA:          1.966 m Patient Age:    82 years            BP:           110/72 mmHg Patient Gender: M                   HR:           73 bpm. Exam Location:  Inpatient Procedure: 2D Echo, Color Doppler and Cardiac Doppler Indications:    Syncope  History:        Patient has prior history of Echocardiogram examinations, most                 recent 07/30/2022. CHF, CKD, Arrythmias:Atrial Fibrillation,                 Signs/Symptoms:Syncope; Risk Factors:Hypertension.  Sonographer:     Milbert Coulter Referring Phys: [id number] ANASTASSIA DOUTOVA IMPRESSIONS  1. Left ventricular ejection fraction, by estimation, is 50 to 55%. Left ventricular ejection fraction by PLAX is 50 %. The left ventricle has low normal function. The left ventricle demonstrates regional wall motion abnormalities (see scoring diagram/findings for description). There is mild left ventricular hypertrophy. Left ventricular diastolic function could not be evaluated. There is moderate hypokinesis of the left ventricular, mid-apical anteroseptal wall and apical segment.  2. Right ventricular systolic function is low normal. The right ventricular size is normal.  3. Left atrial size was mildly dilated.  4. The mitral  valve is grossly normal. Trivial mitral valve regurgitation.  5. The aortic valve is tricuspid. Aortic valve regurgitation is mild. Aortic valve sclerosis/calcification is present, without any evidence of aortic stenosis. Aortic regurgitation PHT measures 485 msec. Comparison(s): Changes from prior study are noted. 07/30/2022: LVEF 65-70%. FINDINGS  Left Ventricle: Left ventricular ejection fraction, by estimation, is 50 to 55%. Left ventricular ejection fraction by PLAX is 50 %. The left ventricle has low normal function. The left ventricle demonstrates regional wall motion abnormalities. Moderate  hypokinesis of the left ventricular, mid-apical anteroseptal wall and apical segment. The left ventricular internal cavity size was normal in size. There is mild left ventricular hypertrophy. Left ventricular diastolic function could not be evaluated due to atrial fibrillation. Left ventricular diastolic function could not be evaluated. Right Ventricle: The right ventricular size is normal. No increase in right ventricular wall thickness. Right ventricular systolic function is low normal. Left Atrium: Left atrial size was mildly dilated. Right Atrium: Right atrial size was normal in size. Pericardium: There is no evidence of  pericardial effusion. Mitral Valve: The mitral valve is grossly normal. Trivial mitral valve regurgitation. Tricuspid Valve: The tricuspid valve is grossly normal. Tricuspid valve regurgitation is trivial. Aortic Valve: The aortic valve is tricuspid. Aortic valve regurgitation is mild. Aortic regurgitation PHT measures 485 msec. Aortic valve sclerosis/calcification is present, without any evidence of aortic stenosis. Aortic valve mean gradient measures 3.0  mmHg. Aortic valve peak gradient measures 6.0 mmHg. Aortic valve area, by VTI measures 2.27 cm. Pulmonic Valve: The pulmonic valve was normal in structure. Pulmonic valve regurgitation is not visualized. Aorta: The ascending aorta was not well visualized and the aortic root was not well visualized. Venous: The inferior vena cava was not well visualized. IAS/Shunts: No atrial level shunt detected by color flow Doppler.  LEFT VENTRICLE PLAX 2D LV EF:         Left            Diastology                ventricular     LV e' medial:    17.10 cm/s                ejection        LV E/e' medial:  7.4                fraction by     LV e' lateral:   17.30 cm/s                PLAX is 50      LV E/e' lateral: 7.3                %. LVIDd:         4.40 cm LVIDs:         3.30 cm LV PW:         1.20 cm LV IVS:        1.10 cm LVOT diam:     1.90 cm LV SV:         50 LV SV Index:   25 LVOT Area:     2.84 cm  RIGHT VENTRICLE RV Basal diam:  2.80 cm RV Mid diam:    2.20 cm RV S prime:     10.70 cm/s TAPSE (M-mode): 1.9 cm LEFT ATRIUM             Index        RIGHT ATRIUM  Index LA diam:        3.10 cm 1.58 cm/m   RA Area:     16.50 cm LA Vol (A2C):   65.9 ml 33.52 ml/m  RA Volume:   42.10 ml  21.42 ml/m LA Vol (A4C):   70.2 ml 35.71 ml/m LA Biplane Vol: 73.2 ml 37.24 ml/m  AORTIC VALVE AV Area (Vmax):    2.37 cm AV Area (Vmean):   2.18 cm AV Area (VTI):     2.27 cm AV Vmax:           122.00 cm/s AV Vmean:          81.900 cm/s AV VTI:            0.220 m AV Peak  Grad:      6.0 mmHg AV Mean Grad:      3.0 mmHg LVOT Vmax:         102.00 cm/s LVOT Vmean:        63.000 cm/s LVOT VTI:          0.176 m LVOT/AV VTI ratio: 0.80 AI PHT:            485 msec  AORTA Ao Root diam: 2.60 cm MITRAL VALVE MV Area (PHT): 4.71 cm     SHUNTS MV Decel Time: 161 msec     Systemic VTI:  0.18 m MV E velocity: 127.00 cm/s  Systemic Diam: 1.90 cm Zoila Shutter MD Electronically signed by Zoila Shutter MD Signature Date/Time: 10/29/2022/3:27:02 PM    Final    DG CHEST PORT 1 VIEW  Result Date: 10/28/2022 CLINICAL DATA:  Syncope. Nausea, vomiting, abdominal pain, and dehydration for 3 days. History of multiple myeloma. EXAM: PORTABLE CHEST 1 VIEW COMPARISON:  08/09/2022 FINDINGS: Shallow inspiration. Heart size and pulmonary vascularity are normal for technique. No airspace disease or consolidation in the lungs. Previous left lower lung infiltrate has resolved in the interval. No pleural effusions. No pneumothorax. Tortuous aorta. Degenerative changes in the spine and shoulders. IMPRESSION: Shallow inspiration.  Lungs are clear. Electronically Signed   By: Burman Nieves M.D.   On: 10/28/2022 20:10   CT ABDOMEN PELVIS W CONTRAST  Result Date: 10/28/2022 CLINICAL DATA:  Abdominal pain with nausea and vomiting for few days. Symptoms worse today. History of small-bowel obstruction. EXAM: CT ABDOMEN AND PELVIS WITH CONTRAST TECHNIQUE: Multidetector CT imaging of the abdomen and pelvis was performed using the standard protocol following bolus administration of intravenous contrast. RADIATION DOSE REDUCTION: This exam was performed according to the departmental dose-optimization program which includes automated exposure control, adjustment of the mA and/or kV according to patient size and/or use of iterative reconstruction technique. CONTRAST:  80mL OMNIPAQUE IOHEXOL 300 MG/ML  SOLN COMPARISON:  Abdominopelvic CT 07/29/2022 and 04/14/2022. Radiographs 10/13/2022 and 08/04/2022. FINDINGS: Lower  chest: Mildly increased subsegmental atelectasis at the left lung base associated with asymmetric elevation of the left hemidiaphragm. No confluent airspace disease, basilar pneumothorax or significant pleural effusion. There is atherosclerosis of the aorta and coronary arteries. Hepatobiliary: The liver is normal in density without suspicious focal abnormality. Scattered hepatic cysts are unchanged. The gallbladder is contracted and not well visualized, but without evidence of acute abnormality. No significant biliary dilatation. Pancreas: Diffusely atrophied. No focal abnormality, ductal dilatation or surrounding inflammation. Spleen: Normal in size without focal abnormality. Adrenals/Urinary Tract: Both adrenal glands appear normal. No evidence of urinary tract calculus, suspicious renal lesion or hydronephrosis. Bilateral renal cysts are grossly stable for which no follow-up imaging is  recommended. A portion of the bladder extends into the right inguinal hernia, less than before, and shows no incarceration. The bladder otherwise appears unremarkable. Stomach/Bowel: No enteric contrast administered. The stomach appears unremarkable for its degree of distention. No small bowel distension, wall thickening or surrounding inflammation identified. Again demonstrated is diffuse dilatation of the colon with multiple air-levels, similar to previous CT from 3 months ago. The right colon measures up to 8.7 cm in diameter. No colonic wall thickening is identified. A large right inguinal hernia is again noted, now containing portions of the cecum, but no significant residual small bowel. Separate moderate size left inguinal hernia contains portions of the sigmoid colon, similar to previous study. There are sigmoid colon diverticular changes. The distal sigmoid colon and rectum are relatively decompressed. Vascular/Lymphatic: There are no enlarged abdominal or pelvic lymph nodes. Aortic and branch vessel atherosclerosis  without evidence of aneurysm or large vessel occlusion. The portal, superior mesenteric and splenic veins are patent. Reproductive: Stable mild enlargement of the prostate gland. 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 As above, the cecum now extends into the right inguinal hernia. Musculoskeletal: No acute or significant osseous findings. Previous bilateral femoral ORIF. Mild multilevel spondylosis. Unless specific follow-up recommendations are mentioned in the findings or impression sections, no imaging follow-up of any mentioned incidental findings is recommended. IMPRESSION: 1. Persistent diffuse dilatation of the colon with multiple air-levels, similar to previous CT from 3 months ago, with decompression of distal sigmoid colon and rectum distal to the chronic sigmoid colon containing left inguinal hernia. Correlate clinically for incarceration and resulting large bowel obstruction. 2. Unchanged size of large right inguinal hernia, now containing a portion of the cecum. No evidence of small-bowel obstruction proximal to this hernia. 3. No evidence of bowel perforation, abscess or ascites. 4. Mildly increased subsegmental atelectasis at the left lung base associated with asymmetric elevation of the left hemidiaphragm. 5.  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 Electronically Signed   By: Carey Bullocks M.D.   On: 10/28/2022 17:49   DG Abd 2 Views  Result Date: 10/13/2022 CLINICAL DATA:  Ogilvie syndrome common diarrhea EXAM: ABDOMEN - 2 VIEW COMPARISON:  KUB 08/06/2022, 08/04/2022, and priors FINDINGS: There is significant gaseous distention of the colon measuring up to approximately 8.4 cm in the transverse colon, similar to the study from 08/04/2022. There is no definite free intraperitoneal air. There is no abnormal soft tissue calcification. The imaged lungs clear. There is no acute osseous abnormality. Postsurgical changes are noted in both  femurs. IMPRESSION: Significant gaseous distention of the colon is similar to the study from 08/04/2022. Electronically Signed   By: Lesia Hausen M.D.   On: 10/13/2022 11:33     Time coordinating discharge: Over 30 minutes    Lewie Chamber, MD  [facility] 10/30/2022, 4:28 PM

## 2022-11-03 LAB — VITAMIN B1: Vitamin B1 (Thiamine): 78 nmol/L (ref 66.5–200.0)

## 2022-11-10 ENCOUNTER — Ambulatory Visit: Payer: Medicare PPO | Admitting: Gastroenterology

## 2022-11-11 DIAGNOSIS — E875 Hyperkalemia: Secondary | ICD-10-CM | POA: Diagnosis not present

## 2022-11-11 DIAGNOSIS — N1831 Chronic kidney disease, stage 3a: Secondary | ICD-10-CM | POA: Diagnosis not present

## 2022-11-11 DIAGNOSIS — D631 Anemia in chronic kidney disease: Secondary | ICD-10-CM | POA: Diagnosis not present

## 2022-11-11 DIAGNOSIS — C9000 Multiple myeloma not having achieved remission: Secondary | ICD-10-CM | POA: Diagnosis not present

## 2022-11-11 DIAGNOSIS — E872 Acidosis, unspecified: Secondary | ICD-10-CM | POA: Diagnosis not present

## 2022-11-11 DIAGNOSIS — I2609 Other pulmonary embolism with acute cor pulmonale: Secondary | ICD-10-CM | POA: Diagnosis not present

## 2022-11-11 DIAGNOSIS — E860 Dehydration: Secondary | ICD-10-CM | POA: Diagnosis not present

## 2022-11-11 DIAGNOSIS — I129 Hypertensive chronic kidney disease with stage 1 through stage 4 chronic kidney disease, or unspecified chronic kidney disease: Secondary | ICD-10-CM | POA: Diagnosis not present

## 2022-11-22 ENCOUNTER — Inpatient Hospital Stay: Payer: Medicare PPO | Admitting: Hematology

## 2022-11-22 ENCOUNTER — Inpatient Hospital Stay: Payer: Medicare PPO | Attending: Hematology

## 2022-12-31 DEATH — deceased

## 2023-01-03 ENCOUNTER — Encounter: Payer: Self-pay | Admitting: Hematology

## 2023-01-10 ENCOUNTER — Ambulatory Visit: Payer: Self-pay | Admitting: Nurse Practitioner
